# Patient Record
Sex: Male | Born: 1990 | Race: White | Hispanic: No | Marital: Single | Smoking: Former smoker
Health system: Southern US, Community
[De-identification: ages and names within clinical notes are randomized; demographics above are authoritative.]

## PROBLEM LIST (undated history)

## (undated) DIAGNOSIS — F419 Anxiety disorder, unspecified: Secondary | ICD-10-CM

## (undated) DIAGNOSIS — F1120 Opioid dependence, uncomplicated: Secondary | ICD-10-CM

## (undated) DIAGNOSIS — I38 Endocarditis, valve unspecified: Secondary | ICD-10-CM

## (undated) DIAGNOSIS — K759 Inflammatory liver disease, unspecified: Secondary | ICD-10-CM

## (undated) DIAGNOSIS — J15212 Pneumonia due to Methicillin resistant Staphylococcus aureus: Secondary | ICD-10-CM

## (undated) HISTORY — DX: Anxiety disorder, unspecified: F41.9

## (undated) HISTORY — DX: Endocarditis, valve unspecified: I38

## (undated) HISTORY — PX: WRIST FRACTURE SURGERY: SHX121

## (undated) HISTORY — PX: OTHER SURGICAL HISTORY: SHX169

## (undated) HISTORY — DX: Pneumonia due to methicillin resistant Staphylococcus aureus: J15.212

---

## 2011-10-18 ENCOUNTER — Encounter (HOSPITAL_COMMUNITY): Payer: Self-pay

## 2011-10-18 ENCOUNTER — Emergency Department (HOSPITAL_COMMUNITY)
Admission: EM | Admit: 2011-10-18 | Discharge: 2011-10-18 | Disposition: A | Discharge status: Home / Self Care | Payer: Self-pay | Attending: Emergency Medicine | Admitting: Emergency Medicine

## 2011-10-18 DIAGNOSIS — F17200 Nicotine dependence, unspecified, uncomplicated: Secondary | ICD-10-CM | POA: Insufficient documentation

## 2011-10-18 DIAGNOSIS — L089 Local infection of the skin and subcutaneous tissue, unspecified: Secondary | ICD-10-CM | POA: Insufficient documentation

## 2011-10-18 MED ORDER — MINOCYCLINE HCL 100 MG PO CAPS: 100.0000 mg | ORAL_CAPSULE | Freq: Two times a day (BID) | ORAL | Status: AC

## 2011-10-18 NOTE — ED Provider Notes (Signed)
History     CSN: [id number]  Arrival date & time 10/18/11  1300   First MD Initiated Contact with Patient 10/18/11 1440      Chief Complaint  Patient presents with  . Wound Check    HPI Pt was seen at 1500.  Per pt, c/o gradual onset and persistence of constant umbilical area "draining thick yellow pus" for the past 3 days.  States the area "hurts to touch it."  Denies abd pain, no fevers, no rash, no injury, no N/V/D.    History reviewed. No pertinent past medical history.  History reviewed. No pertinent past surgical history.    History  Substance Use Topics  . Smoking status: Current Everyday Smoker    Types: Cigarettes  . Smokeless tobacco: Not on file  . Alcohol Use: No    Review of Systems ROS: Statement: All systems negative except as marked or noted in the HPI; Constitutional: Negative for fever and chills. ; ; Eyes: Negative for eye pain, redness and discharge. ; ; ENMT: Negative for ear pain, hoarseness, nasal congestion, sinus pressure and sore throat. ; ; Cardiovascular: Negative for chest pain, palpitations, diaphoresis, dyspnea and peripheral edema. ; ; Respiratory: Negative for cough, wheezing and stridor. ; ; Gastrointestinal: Negative for nausea, vomiting, diarrhea, abdominal pain, blood in stool, hematemesis, jaundice and rectal bleeding. . ; ; Genitourinary: Negative for dysuria, flank pain and hematuria. ; ; Musculoskeletal: Negative for back pain and neck pain. Negative for swelling and trauma.; ; Skin: +skin lesion. Negative for pruritus, rash, abrasions, blisters, bruising; ; Neuro: Negative for headache, lightheadedness and neck stiffness. Negative for weakness, altered level of consciousness , altered mental status, extremity weakness, paresthesias, involuntary movement, seizure and syncope.      Allergies  Amoxil  Home Medications  No current outpatient prescriptions on file.  BP 120/68  Pulse 74  Temp(Src) 98.7 F (37.1 C) (Oral)  Resp 20  Ht  5\' 10"  (1.778 m)  Wt 184 lb 7 oz (83.66 kg)  BMI 26.46 kg/m2  SpO2 100%  Physical Exam 1510: Physical examination:  Nursing notes reviewed; Vital signs and O2 SAT reviewed;  Constitutional: Well developed, Well nourished, Well hydrated, In no acute distress; Head:  Normocephalic, atraumatic; Eyes: EOMI, PERRL, No scleral icterus; ENMT: Mouth and pharynx normal, Mucous membranes moist; Neck: Supple, Full range of motion, No lymphadenopathy; Cardiovascular: Regular rate and rhythm, No murmur, rub, or gallop; Respiratory: Breath sounds clear & equal bilaterally, No rales, rhonchi, wheezes, or rub, Normal respiratory effort/excursion; Chest: Nontender, Movement normal; Abdomen: Soft, Nontender, Nondistended, Normal bowel sounds;; Extremities: Pulses normal, No tenderness, No edema, No calf edema or asymmetry.; Neuro: AA&Ox3, Major CN grossly intact.  No gross focal motor or sensory deficits in extremities.; Skin: Color normal, Warm, Dry, +small draining pustule at lateral base of umbilicus without surrounding erythema, no abd wall erythema, no soft tissue crepitus.    ED Course  Procedures     MDM  MDM Reviewed: nursing note and vitals     3:31 PM:  Pt with small draining pustule at lateral base of umbilicus without surrounding erythema.  No abd wall erythema or soft tissue crepitus.  Appears localized skin infection at this time, will rx PO abx, have pt perform local wound care, f/u PMD for re-check in 2-3 days/return to ED sooner if worsening.  Pt and family agreeable with plan.         Laray Anger, DO 10/20/11 1318

## 2011-10-18 NOTE — ED Notes (Signed)
Drainage --thick yellow from "belly button" x 3 days, hurts to move

## 2011-10-18 NOTE — ED Notes (Signed)
Pt presents with draining wound to inside of umbilicus. Yellow colored drainage noted. Pt reports tenderness to site. Pt states pain and drainage started approx 3 days ago. Pt a/ox4. resp even and unlabored. NAD at this time. Pt sitting up in stretcher. Stretcher in low locked position. Side rail up for pt safety. Call light within reach. Education on plan of care provided. Pt and mother verbalized understanding. Awaiting MD eval and orders. Will continue to monitor.

## 2011-10-18 NOTE — ED Notes (Signed)
Pt a/ox4. Resp even and unlabored. NAD at this time. D/C instructions reviewed with pt. Pt verbalized understanding. Pt ambulated to lobby with steady gate.  

## 2011-10-18 NOTE — Discharge Instructions (Signed)
RESOURCE GUIDE  Dental Problems  Patients with Medicaid: [facility]                     [facility] [address]                                           [address] Phone:  [phone]                                                  Phone:  [phone]  If unable to pay or uninsured, contact:  [facility] or [facility] to become qualified for the adult dental clinic.  Chronic Pain Problems Contact [facility]  [phone] Patients need to be referred by their primary care doctor.  Insufficient Money for Medicine Contact [employer]:  call "211" or [facility] 271-5999.  No Primary Care Doctor Call [facility]  832-8000 Other agencies that provide inexpensive medical care    [facility]  [phone]    [facility]  [phone]    [facility]  [phone]    [facility] Clinic  [phone]    [facility]  [phone]    [facility]  [phone]  Psychological Services [facility]  [phone] [facility]  [phone] [facility]   [phone] (emergency services 641-4993)  Substance Abuse Resources [facility]  336-882-2125 [facility] 336-784-9470 [facility] 336-285-9073 [facility] 336-845-3988 Residential & Outpatient Substance Abuse Program  800-659-3381  Abuse/Neglect [place] Child Abuse Hotline (336) 641-3795 [place] Child Abuse Hotline 800-378-5315 (After Hours)  Emergency Shelter [facility] (336) 271-5985  Maternity Homes [facility] (336) 275-9566 [facility] (704) 372-4663  MRSA Hotline #:   832-7006    [place] [facility]  [facility] [place] [facility]     [employer]                          [facility] [address] [place]                       [address]      [address]  [place]                                                [place]                            [place] Phone:  [phone]                                   Phone:  [phone]                 Phone:  [phone]  [facility] Phone:  [phone]    [facility] Child Abuse Hotline 650-678-9900 213-285-3181 (After Hours)    Take the prescriptions as directed.  Wash the area with soap and water at least twice a day, and cover with a clean/dry dressing.  Change the dressing whenever it becomes wet or soiled.  Call your regular medical doctor today to schedule a follow up appointment for a recheck within the next 2 to 3 days.  Return to the Emergency Department immediately if worsening.

## 2013-04-13 ENCOUNTER — Telehealth: Payer: Self-pay | Admitting: Nurse Practitioner

## 2013-04-13 NOTE — Telephone Encounter (Signed)
ADVISED PT WOULD NEED TO GO TO ER OR URGENT CARE TO BE SEEN TODAY. VERBALIZED UNDERSTANDING. NO APPT AVAILABLE HERE TODAY.

## 2014-01-07 ENCOUNTER — Encounter (HOSPITAL_COMMUNITY): Payer: Self-pay | Admitting: Emergency Medicine

## 2014-01-07 ENCOUNTER — Emergency Department (HOSPITAL_COMMUNITY)
Admission: EM | Admit: 2014-01-07 | Discharge: 2014-01-07 | Disposition: A | Discharge status: Home / Self Care | Payer: 59 | Attending: Emergency Medicine | Admitting: Emergency Medicine

## 2014-01-07 DIAGNOSIS — J209 Acute bronchitis, unspecified: Secondary | ICD-10-CM | POA: Insufficient documentation

## 2014-01-07 DIAGNOSIS — J40 Bronchitis, not specified as acute or chronic: Secondary | ICD-10-CM

## 2014-01-07 DIAGNOSIS — Z8781 Personal history of (healed) traumatic fracture: Secondary | ICD-10-CM | POA: Insufficient documentation

## 2014-01-07 DIAGNOSIS — Z87891 Personal history of nicotine dependence: Secondary | ICD-10-CM | POA: Insufficient documentation

## 2014-01-07 DIAGNOSIS — Z880 Allergy status to penicillin: Secondary | ICD-10-CM | POA: Insufficient documentation

## 2014-01-07 DIAGNOSIS — J069 Acute upper respiratory infection, unspecified: Secondary | ICD-10-CM

## 2014-01-07 DIAGNOSIS — Z79899 Other long term (current) drug therapy: Secondary | ICD-10-CM | POA: Insufficient documentation

## 2014-01-07 MED ORDER — PROMETHAZINE-CODEINE 6.25-10 MG/5ML PO SYRP: 5.0000 mL | ORAL_SOLUTION | Freq: Four times a day (QID) | ORAL | Status: DC | PRN

## 2014-01-07 MED ORDER — CETIRIZINE-PSEUDOEPHEDRINE ER 5-120 MG PO TB12: 1.0000 | ORAL_TABLET | Freq: Every day | ORAL | Status: DC

## 2014-01-07 MED ORDER — DEXAMETHASONE 4 MG PO TABS: ORAL_TABLET | ORAL | Status: DC

## 2014-01-07 NOTE — ED Notes (Signed)
Chest congestion x 3-4 days w/otalgia starting yesterday.  Denies fevers, n/v.

## 2014-01-07 NOTE — ED Provider Notes (Signed)
Medical screening examination/treatment/procedure(s) were performed by non-physician practitioner and as supervising physician I was immediately available for consultation/collaboration.   EKG Interpretation None        Zebulin Siegel L Jaquetta Currier, MD 01/07/14 2309 

## 2014-01-07 NOTE — ED Notes (Signed)
Lt ear pain, chest congestion, yellow sputum.  , no fever, no n/v  Sick for 3 days.

## 2014-01-07 NOTE — ED Provider Notes (Signed)
CSN: [id number]     Arrival date & time 01/07/14  1813 History   First MD Initiated Contact with Patient 01/07/14 1901     Chief Complaint  Patient presents with  . Otalgia  . congestion      (Consider location/radiation/quality/duration/timing/severity/associated sxs/prior Treatment) HPI Comments: Patient is a 23 year old male who presents to the emergency department with chest congestion, ear pain, and generally not feeling well. The patient states that approximately 2 weeks ago his wife developed pneumonia. Approximately a week after that his younger son developed pneumonia. The patient gives a 3 to four-day history of chest congestion. He has a productive cough that is usually clear. On yesterday, June 3, the patient developed pain in the right greater than left ear, there's been no drainage appreciated. The patient has had a popping sensation. Patient denies fever or chills. There's been no diarrhea, nausea or vomiting.  Patient is a 23 y.o. male presenting with ear pain. The history is provided by the patient.  Otalgia Associated symptoms: congestion and cough   Associated symptoms: no abdominal pain and no neck pain     History reviewed. No pertinent past medical history. Past Surgical History  Procedure Laterality Date  . Tubes in ears    . Wrist fracture surgery     History reviewed. No pertinent family history. History  Substance Use Topics  . Smoking status: Former Smoker    Types: Cigarettes  . Smokeless tobacco: Not on file  . Alcohol Use: No    Review of Systems  Constitutional: Negative for activity change.       All ROS Neg except as noted in HPI  HENT: Positive for congestion and ear pain. Negative for nosebleeds.   Eyes: Negative for photophobia and discharge.  Respiratory: Positive for cough. Negative for shortness of breath and wheezing.   Cardiovascular: Negative for chest pain and palpitations.  Gastrointestinal: Negative for abdominal pain and blood in  stool.  Genitourinary: Negative for dysuria, frequency and hematuria.  Musculoskeletal: Positive for myalgias. Negative for arthralgias, back pain and neck pain.  Skin: Negative.   Neurological: Negative for dizziness, seizures and speech difficulty.  Psychiatric/Behavioral: Negative for hallucinations and confusion.      Allergies  Amoxil and Penicillins  Home Medications   Prior to Admission medications   Medication Sig Start Date End Date Taking? Authorizing Provider  dextromethorphan-guaiFENesin (TUSSIN DM) 10-100 MG/5ML liquid Take 10 mLs by mouth every 4 (four) hours as needed for cough.   Yes Historical Provider, MD  cetirizine-pseudoephedrine (ZYRTEC-D) 5-120 MG per tablet Take 1 tablet by mouth daily. 01/07/14   Kathie Dike, PA-C  dexamethasone (DECADRON) 4 MG tablet 1 po bid with food 01/07/14   Kathie Dike, PA-C  promethazine-codeine (PHENERGAN WITH CODEINE) 6.25-10 MG/5ML syrup Take 5 mLs by mouth every 6 (six) hours as needed for cough. 01/07/14   Kathie Dike, PA-C   BP 117/69  Pulse 90  Temp(Src) 98 F (36.7 C) (Oral)  Resp 20  Ht 5\' 11"  (1.803 m)  Wt 148 lb 3.2 oz (67.223 kg)  BMI 20.68 kg/m2  SpO2 100% Physical Exam  Nursing note and vitals reviewed. Constitutional: He is oriented to person, place, and time. He appears well-developed and well-nourished.  Non-toxic appearance.  HENT:  Head: Normocephalic.  Right Ear: Tympanic membrane and external ear normal.  Left Ear: Tympanic membrane and external ear normal.  Nasal congestion present. There is no pain to percussion over the sinuses.  There is no  bulging or increased redness of the tympanic membranes. There is some fluid behind the right tympanic membrane.  There is some increased redness of the posterior pharynx, no exudate. The uvula is in the midline.  Eyes: EOM and lids are normal. Pupils are equal, round, and reactive to light.  Neck: Normal range of motion. Neck supple. Carotid bruit is not  present.  Cardiovascular: Normal rate, regular rhythm, normal heart sounds, intact distal pulses and normal pulses.   Pulmonary/Chest: No respiratory distress. He has rhonchi.  Abdominal: Soft. Bowel sounds are normal. There is no tenderness. There is no guarding.  Musculoskeletal: Normal range of motion.  Lymphadenopathy:       Head (right side): No submandibular adenopathy present.       Head (left side): No submandibular adenopathy present.    He has no cervical adenopathy.  Neurological: He is alert and oriented to person, place, and time. He has normal strength. No cranial nerve deficit or sensory deficit.  Skin: Skin is warm and dry.  Psychiatric: He has a normal mood and affect. His speech is normal.    ED Course  Procedures (including critical care time) Labs Review Labs Reviewed - No data to display  Imaging Review No results found.   EKG Interpretation None      MDM Vital signs are well within normal limits. Pulse oximetry 100% on room air. Within normal limits by my interpretation. The examination suggest bronchitis and upper respiratory infection.  The plan at this time is for the patient to increase fluids, use Zyrtec at the every 12 hours, Decadron 2 times daily with food, promethazine codeine cough medication every 6 hours as needed for cough. Patient is further advised to wash hands frequently. He will see his primary physician or return to the emergency department if any changes, problems, or concerns.    Final diagnoses:  Bronchitis  URI (upper respiratory infection)    *I have reviewed nursing notes, vital signs, and all appropriate lab and imaging results for this patient.Kathie Dike**    Maleigh Bagot M Rip Hawes, PA-C 01/07/14 1956

## 2014-01-07 NOTE — Discharge Instructions (Signed)
Please use Zyrtec D2 times daily for congestion. Please use Decadron 2 times daily until all taken. Use promethazine codeine cough medication for cough. This medication may cause drowsiness, please use with caution. Please use Tylenol or ibuprofen should you develop fever or body aches. Please see your primary physician for additional evaluation and management if not improving. Bronchitis Bronchitis is inflammation of the airways that extend from the windpipe into the lungs (bronchi). The inflammation often causes mucus to develop, which leads to a cough. If the inflammation becomes severe, it may cause shortness of breath. CAUSES  Bronchitis may be caused by:   Viral infections.   Bacteria.   Cigarette smoke.   Allergens, pollutants, and other irritants.  SIGNS AND SYMPTOMS  The most common symptom of bronchitis is a frequent cough that produces mucus. Other symptoms include:  Fever.   Body aches.   Chest congestion.   Chills.   Shortness of breath.   Sore throat.  DIAGNOSIS  Bronchitis is usually diagnosed through a medical history and physical exam. Tests, such as chest X-rays, are sometimes done to rule out other conditions.  TREATMENT  You may need to avoid contact with whatever caused the problem (smoking, for example). Medicines are sometimes needed. These may include:  Antibiotics. These may be prescribed if the condition is caused by bacteria.  Cough suppressants. These may be prescribed for relief of cough symptoms.   Inhaled medicines. These may be prescribed to help open your airways and make it easier for you to breathe.   Steroid medicines. These may be prescribed for those with recurrent (chronic) bronchitis. HOME CARE INSTRUCTIONS  Get plenty of rest.   Drink enough fluids to keep your urine clear or pale yellow (unless you have a medical condition that requires fluid restriction). Increasing fluids may help thin your secretions and will prevent  dehydration.   Only take over-the-counter or prescription medicines as directed by your health care provider.  Only take antibiotics as directed. Make sure you finish them even if you start to feel better.  Avoid secondhand smoke, irritating chemicals, and strong fumes. These will make bronchitis worse. If you are a smoker, quit smoking. Consider using nicotine gum or skin patches to help control withdrawal symptoms. Quitting smoking will help your lungs heal faster.   Put a cool-mist humidifier in your bedroom at night to moisten the air. This may help loosen mucus. Change the water in the humidifier daily. You can also run the hot water in your shower and sit in the bathroom with the door closed for 5 10 minutes.   Follow up with your health care provider as directed.   Wash your hands frequently to avoid catching bronchitis again or spreading an infection to others.  SEEK MEDICAL CARE IF: Your symptoms do not improve after 1 week of treatment.  SEEK IMMEDIATE MEDICAL CARE IF:  Your fever increases.  You have chills.   You have chest pain.   You have worsening shortness of breath.   You have bloody sputum.  You faint.  You have lightheadedness.  You have a severe headache.   You vomit repeatedly. MAKE SURE YOU:   Understand these instructions.  Will watch your condition.  Will get help right away if you are not doing well or get worse. Document Released: 07/22/2005 Document Revised: 05/12/2013 Document Reviewed: 03/16/2013 [facility] Patient Information 2014 [place], [place].

## 2020-12-05 ENCOUNTER — Other Ambulatory Visit: Payer: Self-pay

## 2020-12-05 ENCOUNTER — Emergency Department (HOSPITAL_COMMUNITY)
Admission: EM | Admit: 2020-12-05 | Discharge: 2020-12-05 | Disposition: A | Discharge status: Home / Self Care | Payer: BLUE CROSS/BLUE SHIELD | Attending: Emergency Medicine | Admitting: Emergency Medicine

## 2020-12-05 ENCOUNTER — Emergency Department (HOSPITAL_COMMUNITY): Payer: BLUE CROSS/BLUE SHIELD

## 2020-12-05 ENCOUNTER — Encounter (HOSPITAL_COMMUNITY): Payer: Self-pay | Admitting: *Deleted

## 2020-12-05 DIAGNOSIS — L03113 Cellulitis of right upper limb: Secondary | ICD-10-CM | POA: Insufficient documentation

## 2020-12-05 DIAGNOSIS — M7989 Other specified soft tissue disorders: Secondary | ICD-10-CM | POA: Diagnosis present

## 2020-12-05 DIAGNOSIS — F17210 Nicotine dependence, cigarettes, uncomplicated: Secondary | ICD-10-CM | POA: Insufficient documentation

## 2020-12-05 LAB — COMPREHENSIVE METABOLIC PANEL
ALT: 37 U/L (ref 0–44)
AST: 40 U/L (ref 15–41)
Albumin: 3.8 g/dL (ref 3.5–5.0)
Alkaline Phosphatase: 68 U/L (ref 38–126)
Anion gap: 8 (ref 5–15)
BUN: 6 mg/dL (ref 6–20)
CO2: 29 mmol/L (ref 22–32)
Calcium: 8.9 mg/dL (ref 8.9–10.3)
Chloride: 97 mmol/L — ABNORMAL LOW (ref 98–111)
Creatinine, Ser: 0.84 mg/dL (ref 0.61–1.24)
GFR, Estimated: >60 mL/min (ref >60)
Glucose, Bld: 96 mg/dL (ref 70–99)
Potassium: 4.2 mmol/L (ref 3.5–5.1)
Sodium: 134 mmol/L — ABNORMAL LOW (ref 135–145)
Total Bilirubin: 1.6 mg/dL — ABNORMAL HIGH (ref 0.3–1.2)
Total Protein: 7.3 g/dL (ref 6.5–8.1)

## 2020-12-05 LAB — CBC WITH DIFFERENTIAL/PLATELET
Abs Immature Granulocytes: 0.02 10*3/uL (ref 0.00–0.07)
Basophils Absolute: 0 10*3/uL (ref 0.0–0.1)
Basophils Relative: 0 %
Eosinophils Absolute: 0.1 10*3/uL (ref 0.0–0.5)
Eosinophils Relative: 1 %
HCT: 38.9 % — ABNORMAL LOW (ref 39.0–52.0)
Hemoglobin: 13.4 g/dL (ref 13.0–17.0)
Immature Granulocytes: 0 %
Lymphocytes Relative: 19 %
Lymphs Abs: 1.6 10*3/uL (ref 0.7–4.0)
MCH: 31.8 pg (ref 26.0–34.0)
MCHC: 34.4 g/dL (ref 30.0–36.0)
MCV: 92.2 fL (ref 80.0–100.0)
Monocytes Absolute: 0.8 10*3/uL (ref 0.1–1.0)
Monocytes Relative: 10 %
Neutro Abs: 6.1 10*3/uL (ref 1.7–7.7)
Neutrophils Relative %: 70 %
Platelets: 222 10*3/uL (ref 150–400)
RBC: 4.22 MIL/uL (ref 4.22–5.81)
RDW: 13.6 % (ref 11.5–15.5)
WBC: 8.6 10*3/uL (ref 4.0–10.5)
nRBC: 0 % (ref 0.0–0.2)

## 2020-12-05 IMAGING — DX DG HAND COMPLETE 3+V*R*
3 series · 3 of 3 positions shown · non-contrast
Comparison: None.

CLINICAL DATA: Right hand swelling for 2 days.  No known injury.

EXAM:
RIGHT HAND - COMPLETE 3+ VIEW

[hand ap]
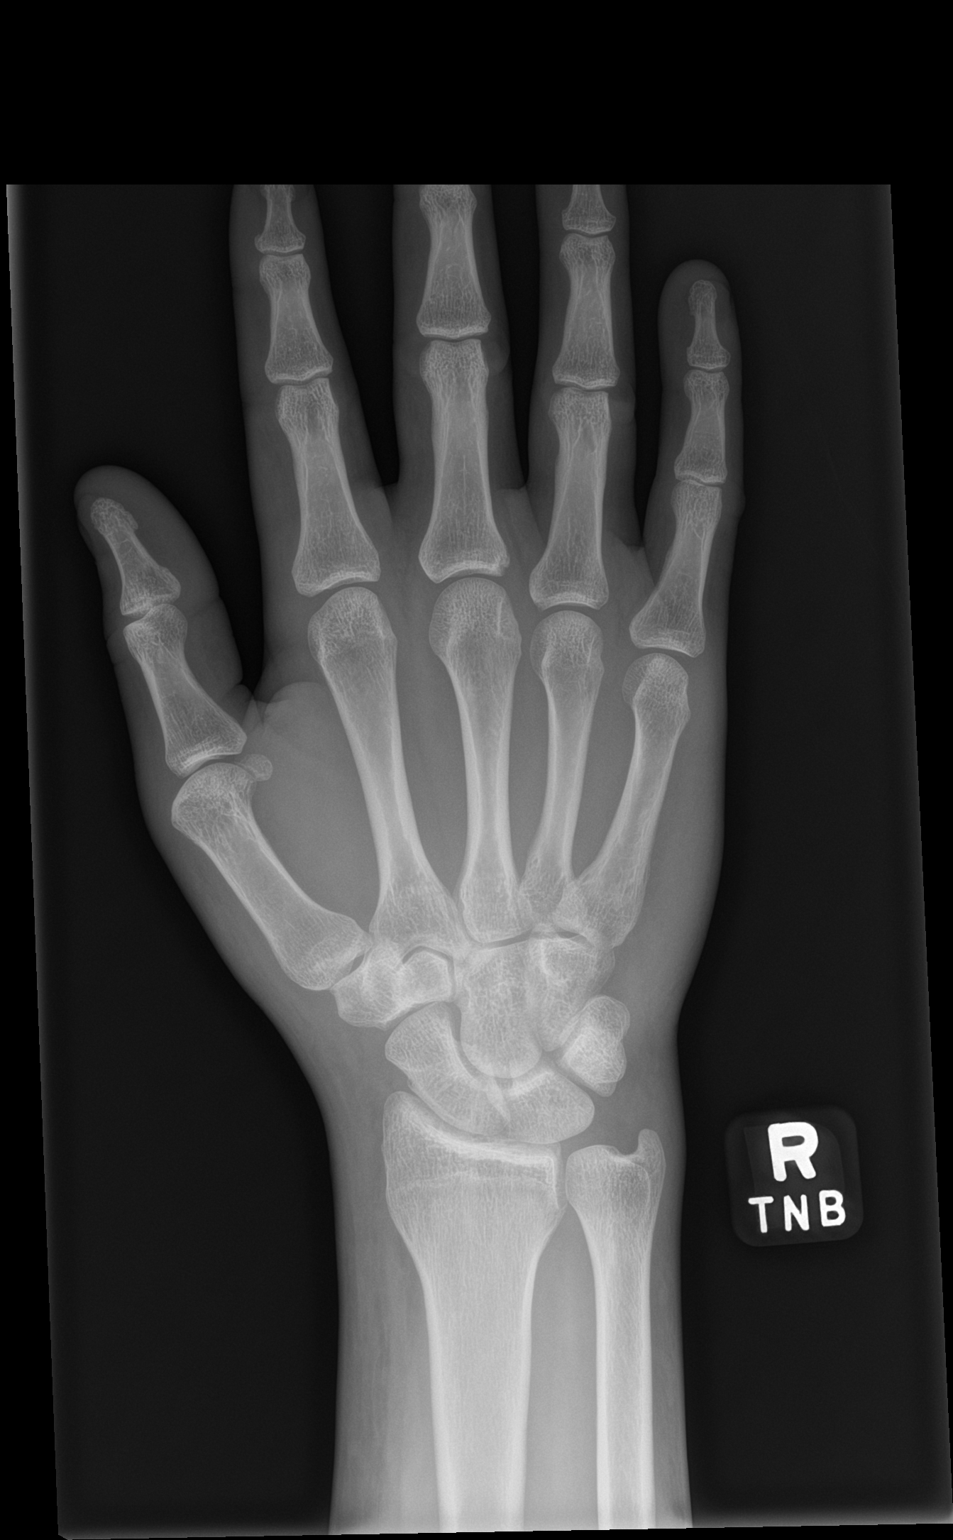

[hand obl]
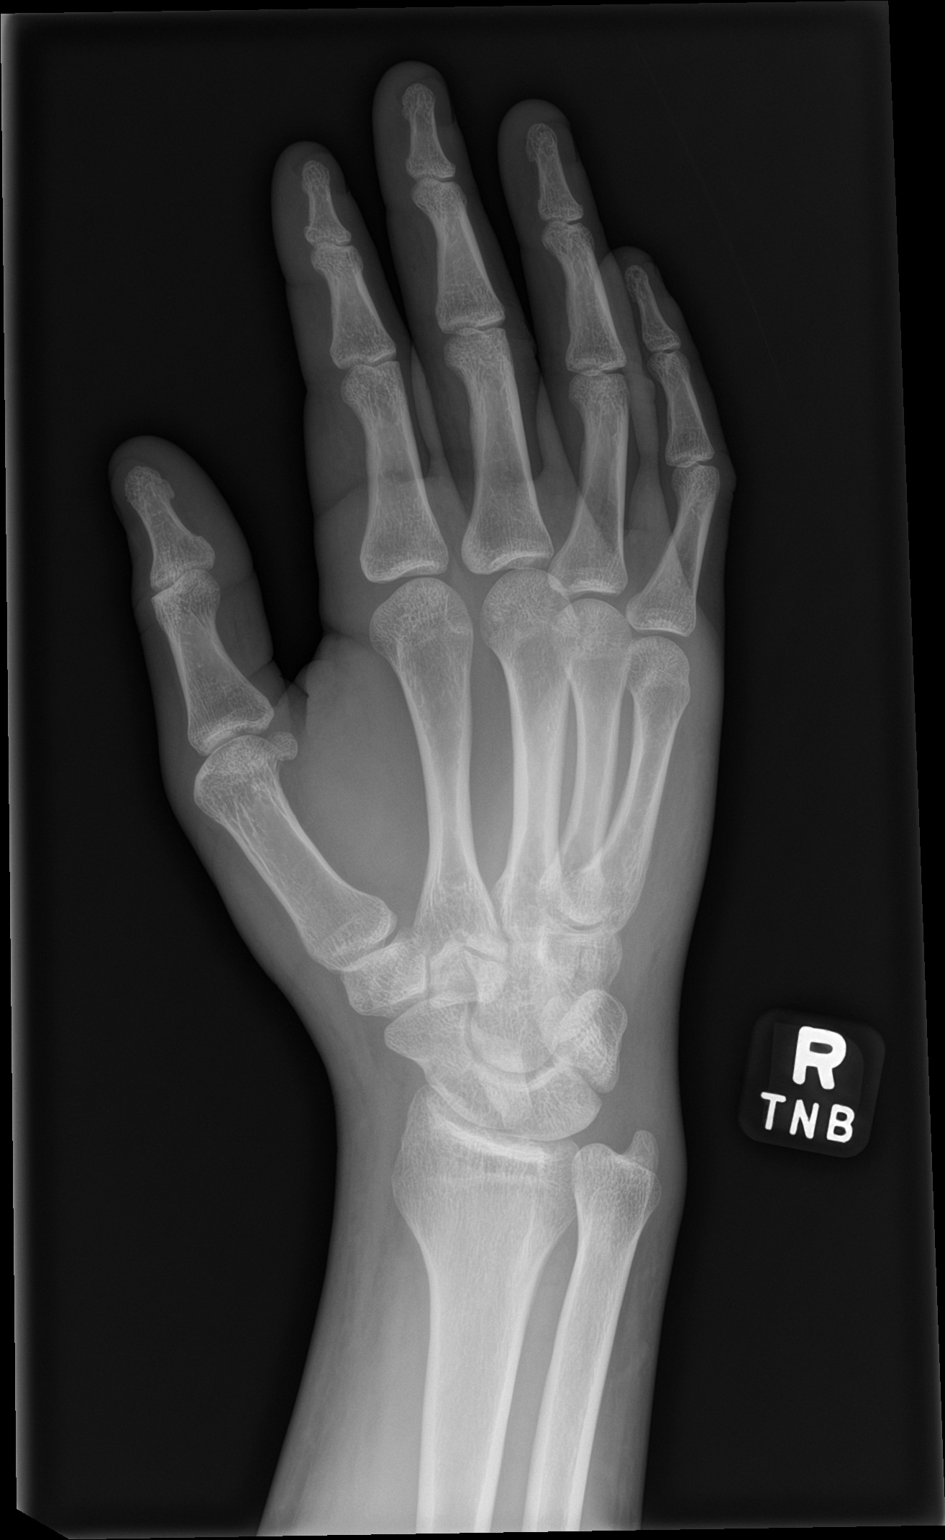

[hand lat]
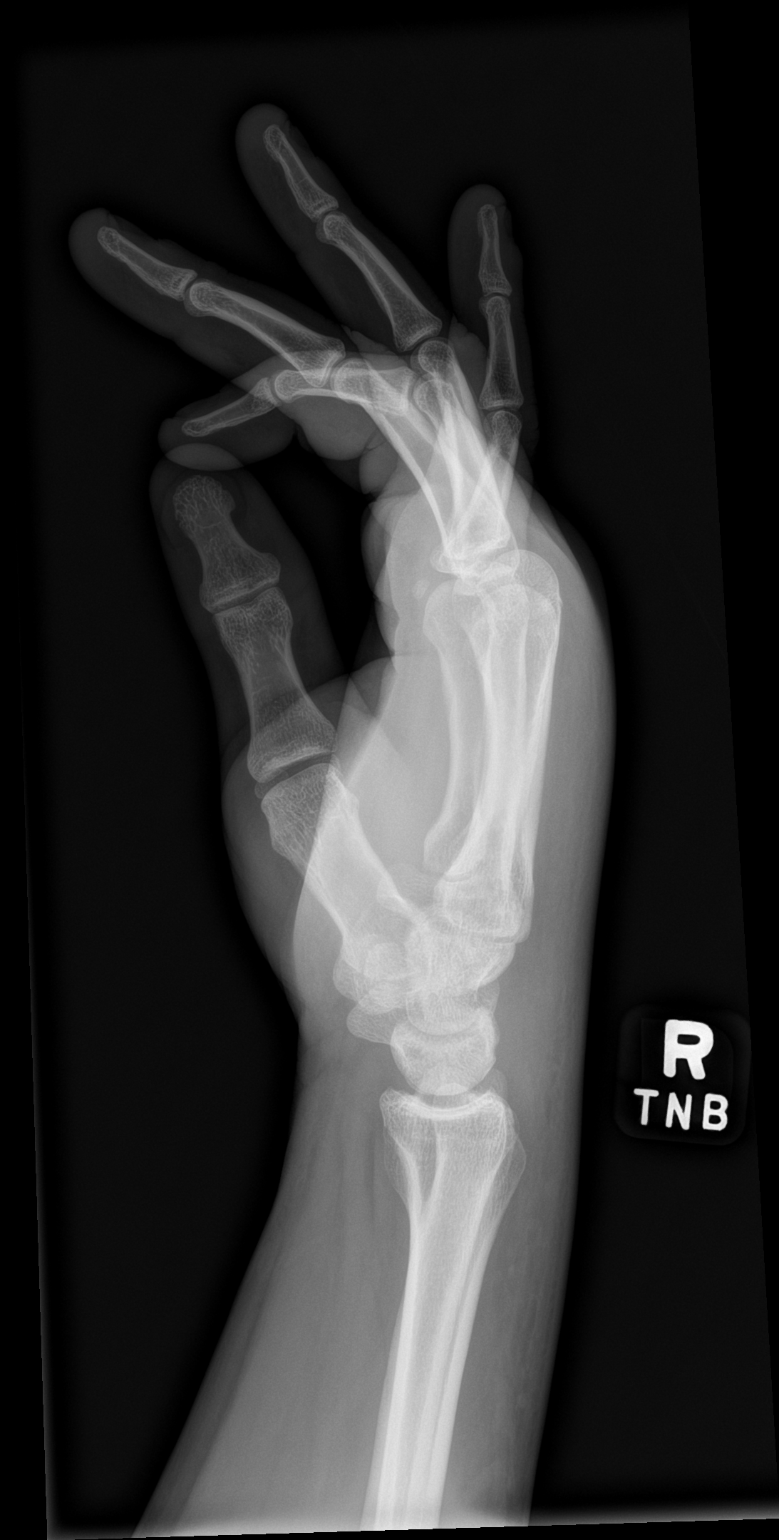

[3 of 3 positions shown; findings below may reference images not displayed]

FINDINGS: Dorsal hand and wrist soft tissue swelling. No radiopaque foreign
body or soft tissue air.

No fracture or bone lesion.  Joints normally spaced and aligned.
IMPRESSION: 1. No fracture, bone lesion or joint abnormality.
2. Nonspecific dorsal soft tissue swelling.

## 2020-12-05 MED ORDER — CLINDAMYCIN HCL 150 MG PO CAPS: 300.0000 mg | ORAL_CAPSULE | Freq: Four times a day (QID) | ORAL | 0 refills | Status: DC

## 2020-12-05 MED ORDER — VANCOMYCIN HCL IN DEXTROSE 1-5 GM/200ML-% IV SOLN
1000.0000 mg | Freq: Once | INTRAVENOUS | Status: AC
  Administered 2020-12-05: 1000 mg via INTRAVENOUS
  Filled 2020-12-05: qty 200

## 2020-12-05 NOTE — ED Triage Notes (Signed)
Right hand swollen and red x 2 days

## 2020-12-05 NOTE — Discharge Instructions (Signed)
Elevate your hand when possible.  Avoid use of your right hand.  Start the antibiotic prescription this evening.  As discussed, return here in 24 to 48 hours for recheck.  Return tomorrow if the redness of your hand is increasing or you develop fever or chills.  If the redness seems to be improving, you may return on Thursday for recheck.

## 2020-12-05 NOTE — ED Provider Notes (Signed)
[facility] EMERGENCY DEPARTMENT Provider Note   CSN: [id number] Arrival date & time: 12/05/20  9833     History No chief complaint on file.   Reginald Clarke is a 30 y.o. male.  HPI      Reginald Loppnow is a 30 y.o. male who presents to the Emergency Department for evaluation of pain redness and swelling of his right hand.  Symptoms began 2 days ago.  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  Developed some swelling and redness along his right wrist that has spread into his hand and distal forearm.  He notes having pain with attempted movement of his fingers and with gripping.  He is right-hand dominant.  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  Unsure of possible foreign body.  He does note some increased redness along the base of his right index finger.  He denies numbness of his fingers, pain of his elbow or shoulder, fever, chills, nausea or vomiting.    History reviewed. No pertinent past medical history.  There are no problems to display for this patient.   Past Surgical History:  Procedure Laterality Date  . tubes in ears    . WRIST FRACTURE SURGERY       No family history on file.  Social History   Tobacco Use  . Smoking status: Current Every Day Smoker    Types: Cigarettes  . Smokeless tobacco: Never Used  Substance Use Topics  . Alcohol use: No  . Drug use: No    Home Medications Prior to Admission medications   Medication Sig Start Date End Date Taking? Authorizing Provider  cetirizine-pseudoephedrine (ZYRTEC-D) 5-120 MG per tablet Take 1 tablet by mouth daily. Patient not taking: No sig reported 01/07/14   Ivery Quale, PA-C  dexamethasone (DECADRON) 4 MG tablet 1 po bid with food Patient not taking: No sig reported 01/07/14   Ivery Quale, PA-C  promethazine-codeine (PHENERGAN WITH CODEINE) 6.25-10 MG/5ML syrup Take 5 mLs by mouth every 6 (six) hours as needed  for cough. Patient not taking: Reported on 12/05/2020 01/07/14   Ivery Quale, PA-C    Allergies    Amoxil [amoxicillin trihydrate] and Penicillins  Review of Systems   Review of Systems  Constitutional: Negative for activity change, appetite change, chills and fever.  HENT: Negative for facial swelling, sore throat and trouble swallowing.   Respiratory: Negative for chest tightness and shortness of breath.   Cardiovascular: Negative for chest pain.  Gastrointestinal: Negative for nausea and vomiting.  Musculoskeletal: Positive for arthralgias (right hand and wrist pain) and myalgias. Negative for neck pain and neck stiffness.  Skin: Positive for color change (redness of dorsal hand and wrist.  ). Negative for rash and wound.  Neurological: Negative for dizziness, syncope, weakness, numbness and headaches.    Physical Exam Updated Vital Signs BP 124/78   Pulse 69   Temp 98.1 F (36.7 C) (Oral)   Resp 18   Ht 5\' 10"  (1.778 m)   Wt 88.6 kg   SpO2 100%   BMI 28.03 kg/m   Physical Exam Vitals and nursing note reviewed.  Constitutional:      General: He is not in acute distress.    Appearance: Normal appearance. He is not ill-appearing.  Eyes:     Conjunctiva/sclera: Conjunctivae normal.  Cardiovascular:     Rate and Rhythm: Normal rate and regular rhythm.  Pulses: Normal pulses.  Pulmonary:     Effort: Pulmonary effort is normal.     Breath sounds: Normal breath sounds.  Musculoskeletal:        General: Swelling and tenderness present.     Comments: Diffuse tenderness to the dorsal right hand and wrist.  Mild to moderate edema noted. Pt unable to fully close right hand due to edema.    Skin:    General: Skin is warm.     Capillary Refill: Capillary refill takes less than 2 seconds.     Findings: Erythema present.     Comments: Erythema of the dorsal hand and wrist.  Excessive erythema noted at the base of the right index finger.  No palpable foreign body or abscess  seen.  No wounds to the webspaces of the fingers.  See attached photo  Neurological:     General: No focal deficit present.     Mental Status: He is alert.         ED Results / Procedures / Treatments   Labs (all labs ordered are listed, but only abnormal results are displayed) Labs Reviewed  COMPREHENSIVE METABOLIC PANEL - Abnormal; Notable for the following components:      Result Value   Sodium 134 (*)    Chloride 97 (*)    Total Bilirubin 1.6 (*)    All other components within normal limits  CBC WITH DIFFERENTIAL/PLATELET - Abnormal; Notable for the following components:   HCT 38.9 (*)    All other components within normal limits    EKG None  Radiology DG Hand Complete Right  Result Date: 12/05/2020 CLINICAL DATA:  Right hand swelling for 2 days.  No known injury. EXAM: RIGHT HAND - COMPLETE 3+ VIEW COMPARISON:  None. FINDINGS: Dorsal hand and wrist soft tissue swelling. No radiopaque foreign body or soft tissue air. No fracture or bone lesion.  Joints normally spaced and aligned. IMPRESSION: 1. No fracture, bone lesion or joint abnormality. 2. Nonspecific dorsal soft tissue swelling. Electronically Signed   By: Amie Portland M.D.   On: 12/05/2020 12:11    Procedures Procedures   Medications Ordered in ED Medications  vancomycin (VANCOCIN) IVPB 1000 mg/200 mL premix (0 mg Intravenous Stopped 12/05/20 1345)    ED Course  I have reviewed the triage vital signs and the nursing notes.  Pertinent labs & imaging results that were available during my care of the patient were reviewed by me and considered in my medical decision making (see chart for details).    MDM Rules/Calculators/A&P                          Pt here with a cellulitis of the hand.  IV drug user, last used 4 days ago.  Injected meth at the right [facility].  Sx's localized to his hand, right AC has track marks, but no erythema, edema or lymphangitis at the reported injection site.    X-ray without presence of  radiopaque foreign body or soft tissue gas.  No bony abnormalities. Labs without evidence of leukocytosis or significant electrolyte derangement.  Patient well-appearing.  Vital signs reviewed.  No concerning symptoms for sepsis and no discrete abscess seen on his hand exam.  Offered admission for his hand cellulitis.  Patient declines admission but is agreeable to return here in 24 to 48 hours for recheck.  Leading edge of erythema was marked by me and photos are attached.  He was given IV vancomycin  here and will be put on oral clindamycin for home.   Final Clinical Impression(s) / ED Diagnoses Final diagnoses:  Cellulitis of right hand    Rx / DC Orders ED Discharge Orders    None       Pauline Aus, PA-C 12/07/20 Denyse Dago, MD 12/11/20 [phone]

## 2020-12-05 NOTE — ED Triage Notes (Addendum)
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

## 2020-12-07 ENCOUNTER — Other Ambulatory Visit: Payer: Self-pay

## 2020-12-07 ENCOUNTER — Emergency Department (HOSPITAL_COMMUNITY)
Admission: EM | Admit: 2020-12-07 | Discharge: 2020-12-07 | Disposition: A | Discharge status: Home / Self Care | Payer: BLUE CROSS/BLUE SHIELD | Attending: Emergency Medicine | Admitting: Emergency Medicine

## 2020-12-07 ENCOUNTER — Encounter (HOSPITAL_COMMUNITY): Payer: Self-pay | Admitting: Emergency Medicine

## 2020-12-07 DIAGNOSIS — L02413 Cutaneous abscess of right upper limb: Secondary | ICD-10-CM | POA: Insufficient documentation

## 2020-12-07 DIAGNOSIS — R21 Rash and other nonspecific skin eruption: Secondary | ICD-10-CM | POA: Diagnosis present

## 2020-12-07 DIAGNOSIS — F17210 Nicotine dependence, cigarettes, uncomplicated: Secondary | ICD-10-CM | POA: Insufficient documentation

## 2020-12-07 DIAGNOSIS — E876 Hypokalemia: Secondary | ICD-10-CM

## 2020-12-07 DIAGNOSIS — L02519 Cutaneous abscess of unspecified hand: Secondary | ICD-10-CM

## 2020-12-07 LAB — COMPREHENSIVE METABOLIC PANEL
ALT: 31 U/L (ref 0–44)
AST: 24 U/L (ref 15–41)
Albumin: 3.2 g/dL — ABNORMAL LOW (ref 3.5–5.0)
Alkaline Phosphatase: 64 U/L (ref 38–126)
Anion gap: 6 (ref 5–15)
BUN: 8 mg/dL (ref 6–20)
CO2: 33 mmol/L — ABNORMAL HIGH (ref 22–32)
Calcium: 8.7 mg/dL — ABNORMAL LOW (ref 8.9–10.3)
Chloride: 98 mmol/L (ref 98–111)
Creatinine, Ser: 0.6 mg/dL — ABNORMAL LOW (ref 0.61–1.24)
GFR, Estimated: >60 mL/min (ref >60)
Glucose, Bld: 105 mg/dL — ABNORMAL HIGH (ref 70–99)
Potassium: 2.5 mmol/L — CL (ref 3.5–5.1)
Sodium: 137 mmol/L (ref 135–145)
Total Bilirubin: 0.7 mg/dL (ref 0.3–1.2)
Total Protein: 6.5 g/dL (ref 6.5–8.1)

## 2020-12-07 LAB — CBC WITH DIFFERENTIAL/PLATELET
Abs Immature Granulocytes: 0.02 10*3/uL (ref 0.00–0.07)
Basophils Absolute: 0 10*3/uL (ref 0.0–0.1)
Basophils Relative: 0 %
Eosinophils Absolute: 0.1 10*3/uL (ref 0.0–0.5)
Eosinophils Relative: 1 %
HCT: 40.2 % (ref 39.0–52.0)
Hemoglobin: 13.9 g/dL (ref 13.0–17.0)
Immature Granulocytes: 0 %
Lymphocytes Relative: 21 %
Lymphs Abs: 1.6 10*3/uL (ref 0.7–4.0)
MCH: 32 pg (ref 26.0–34.0)
MCHC: 34.6 g/dL (ref 30.0–36.0)
MCV: 92.6 fL (ref 80.0–100.0)
Monocytes Absolute: 0.8 10*3/uL (ref 0.1–1.0)
Monocytes Relative: 10 %
Neutro Abs: 5.1 10*3/uL (ref 1.7–7.7)
Neutrophils Relative %: 68 %
Platelets: 261 10*3/uL (ref 150–400)
RBC: 4.34 MIL/uL (ref 4.22–5.81)
RDW: 14 % (ref 11.5–15.5)
WBC: 7.6 10*3/uL (ref 4.0–10.5)
nRBC: 0 % (ref 0.0–0.2)

## 2020-12-07 LAB — AEROBIC CULTURE W GRAM STAIN (SUPERFICIAL SPECIMEN)

## 2020-12-07 MED ORDER — POTASSIUM CHLORIDE CRYS ER 20 MEQ PO TBCR
40.0000 meq | EXTENDED_RELEASE_TABLET | Freq: Once | ORAL | Status: AC
  Administered 2020-12-07: 40 meq via ORAL
  Filled 2020-12-07: qty 2

## 2020-12-07 MED ORDER — LIDOCAINE HCL (PF) 2 % IJ SOLN: 10.0000 mL | Freq: Once | INTRAMUSCULAR | Status: AC

## 2020-12-07 MED ORDER — VANCOMYCIN HCL IN DEXTROSE 1-5 GM/200ML-% IV SOLN
1000.0000 mg | Freq: Once | INTRAVENOUS | Status: AC
  Administered 2020-12-07: 1000 mg via INTRAVENOUS
  Filled 2020-12-07: qty 200

## 2020-12-07 MED ORDER — LIDOCAINE HCL (PF) 2 % IJ SOLN
INTRAMUSCULAR | Status: AC
  Administered 2020-12-07: 10 mL
  Filled 2020-12-07: qty 10

## 2020-12-07 MED ORDER — POTASSIUM CHLORIDE 10 MEQ/100ML IV SOLN
10.0000 meq | Freq: Once | INTRAVENOUS | Status: AC
  Administered 2020-12-07: 10 meq via INTRAVENOUS
  Filled 2020-12-07: qty 100

## 2020-12-07 NOTE — ED Triage Notes (Signed)
Pt reports was seen for same several days ago. Pt reports pain swelling continues. Pt noted to have redness extending past purple margins that were drawn last visit.

## 2020-12-07 NOTE — ED Provider Notes (Signed)
[facility] EMERGENCY DEPARTMENT Provider Note   CSN: [id number] Arrival date & time: 12/07/20  1000     History Chief Complaint  Patient presents with  . Hand Pain    Reginald Clarke is a 30 y.o. male.  Patient return for cellulitis to his right hand.  He has been taking clindamycin for infection.  He states it seemed to be getting worse on the ventral part of his hand  The history is provided by the patient and medical records. No language interpreter was used.  Rash Location: Right arm. Quality: not blistering   Severity:  Moderate Onset quality:  Sudden Timing:  Constant Progression:  Worsening Chronicity:  New Context: not animal contact   Relieved by:  Nothing Associated symptoms: no abdominal pain, no diarrhea, no fatigue and no headaches        History reviewed. No pertinent past medical history.  There are no problems to display for this patient.   Past Surgical History:  Procedure Laterality Date  . tubes in ears    . WRIST FRACTURE SURGERY         History reviewed. No pertinent family history.  Social History   Tobacco Use  . Smoking status: Current Every Day Smoker    Types: Cigarettes  . Smokeless tobacco: Never Used  Substance Use Topics  . Alcohol use: No  . Drug use: No    Home Medications Prior to Admission medications   Medication Sig Start Date End Date Taking? Authorizing Provider  cetirizine-pseudoephedrine (ZYRTEC-D) 5-120 MG per tablet Take 1 tablet by mouth daily. Patient not taking: No sig reported 01/07/14   Ivery Quale, PA-C  clindamycin (CLEOCIN) 150 MG capsule Take 2 capsules (300 mg total) by mouth 4 (four) times daily. 12/05/20   Triplett, Tammy, PA-C  dexamethasone (DECADRON) 4 MG tablet 1 po bid with food Patient not taking: No sig reported 01/07/14   Ivery Quale, PA-C  promethazine-codeine (PHENERGAN WITH CODEINE) 6.25-10 MG/5ML syrup Take 5 mLs by mouth every 6 (six) hours as needed for cough. Patient not taking:  Reported on 12/05/2020 01/07/14   Ivery Quale, PA-C    Allergies    Amoxil [amoxicillin trihydrate] and Penicillins  Review of Systems   Review of Systems  Constitutional: Negative for appetite change and fatigue.  HENT: Negative for congestion, ear discharge and sinus pressure.   Eyes: Negative for discharge.  Respiratory: Negative for cough.   Cardiovascular: Negative for chest pain.  Gastrointestinal: Negative for abdominal pain and diarrhea.  Genitourinary: Negative for frequency and hematuria.  Musculoskeletal: Negative for back pain.  Skin: Positive for rash.       Tender right hand  Neurological: Negative for seizures and headaches.  Psychiatric/Behavioral: Negative for hallucinations.    Physical Exam Updated Vital Signs BP 126/76 (BP Location: Right Arm)   Pulse 66   Temp 98.5 F (36.9 C) (Oral)   Resp 18   Ht 5\' 10"  (1.778 m)   Wt 88.5 kg   SpO2 95%   BMI 27.98 kg/m   Physical Exam Vitals and nursing note reviewed.  Constitutional:      Appearance: He is well-developed.  HENT:     Head: Normocephalic.     Nose: Nose normal.  Eyes:     General: No scleral icterus.    Conjunctiva/sclera: Conjunctivae normal.  Neck:     Thyroid: No thyromegaly.  Cardiovascular:     Rate and Rhythm: Normal rate and regular rhythm.     Heart sounds: No  murmur heard. No friction rub. No gallop.   Pulmonary:     Breath sounds: No stridor. No wheezing or rales.  Chest:     Chest wall: No tenderness.  Abdominal:     General: There is no distension.     Tenderness: There is no abdominal tenderness. There is no rebound.  Musculoskeletal:        General: Normal range of motion.     Cervical back: Neck supple.     Comments: Patient has cellulitis of her right hand and distal forearm.  He seems to have an abscess at the ventral surface of his right hand near the first and second MCP joints  Lymphadenopathy:     Cervical: No cervical adenopathy.  Skin:    Findings: Erythema  and rash present.  Neurological:     Mental Status: He is alert and oriented to person, place, and time.     Motor: No abnormal muscle tone.     Coordination: Coordination normal.  Psychiatric:        Behavior: Behavior normal.         ED Results / Procedures / Treatments   Labs (all labs ordered are listed, but only abnormal results are displayed) Labs Reviewed  CBC WITH DIFFERENTIAL/PLATELET  COMPREHENSIVE METABOLIC PANEL    EKG None  Radiology DG Hand Complete Right  Result Date: 12/05/2020 CLINICAL DATA:  Right hand swelling for 2 days.  No known injury. EXAM: RIGHT HAND - COMPLETE 3+ VIEW COMPARISON:  None. FINDINGS: Dorsal hand and wrist soft tissue swelling. No radiopaque foreign body or soft tissue air. No fracture or bone lesion.  Joints normally spaced and aligned. IMPRESSION: 1. No fracture, bone lesion or joint abnormality. 2. Nonspecific dorsal soft tissue swelling. Electronically Signed   By: Amie Portland M.D.   On: 12/05/2020 12:11    Procedures .Marland KitchenIncision and Drainage  Date/Time: 12/07/2020 4:55 PM Performed by: Bethann Berkshire, MD Authorized by: Bethann Berkshire, MD   Comments:     Patient had an abscess to the first and second MCP joint.  Area was cleaned thoroughly with Betadine.  It was anesthetized with 2% lidocaine.  #11 blade was used to open the abscess and moderate amount of pus was removed.  Cultures were done.  Area with irrigated thoroughly.  Abscess was packed.  There was no Hibiclens available to soak in the wound.  Patient tolerated the procedure well     Medications Ordered in ED Medications - No data to display  ED Course  I have reviewed the triage vital signs and the nursing notes.  Pertinent labs & imaging results that were available during my care of the patient were reviewed by me and considered in my medical decision making (see chart for details).    MDM Rules/Calculators/A&P                          Abscess to right hand  drained.  Patient will follow up with orthopedics tomorrow.  Patient also had some hypokalemia that has been treated and he will follow-up for that Final Clinical Impression(s) / ED Diagnoses Final diagnoses:  None    Rx / DC Orders ED Discharge Orders    None       Bethann Berkshire, MD 12/07/20 1656

## 2020-12-07 NOTE — Discharge Instructions (Addendum)
Follow-up at [facility] office at 9 AM tomorrow.  Continue taking your clindamycin.  Follow-up with a family doctor or urgent care next week to get your potassium rechecked

## 2020-12-07 NOTE — ED Notes (Signed)
Date and time results received: 12/07/20 1220   Test: 2.5 Critical Value: Potassium Name of Provider Notified: Dr. Estell Harpin  Orders Received? Or Actions Taken?: No new orders given.

## 2020-12-08 ENCOUNTER — Ambulatory Visit (INDEPENDENT_AMBULATORY_CARE_PROVIDER_SITE_OTHER): Payer: BLUE CROSS/BLUE SHIELD | Admitting: Orthopedic Surgery

## 2020-12-08 ENCOUNTER — Encounter: Payer: Self-pay | Admitting: Orthopedic Surgery

## 2020-12-08 VITALS — BP 129/78 | HR 60 | Ht 70.0 in | Wt 199.0 lb

## 2020-12-08 DIAGNOSIS — L02511 Cutaneous abscess of right hand: Secondary | ICD-10-CM | POA: Diagnosis not present

## 2020-12-08 DIAGNOSIS — L02519 Cutaneous abscess of unspecified hand: Secondary | ICD-10-CM

## 2020-12-08 MED ORDER — HIBICLENS 4 % EX LIQD: Freq: Every day | CUTANEOUS | 0 refills | Status: DC | PRN

## 2020-12-08 NOTE — Progress Notes (Signed)
New Patient Visit  Assessment: [place] Pressman is a 30 y.o. RHD male with the following: Collar-button abscess of the right second webspace  Plan: Patient states he is doing much better following a bedside I&D in the emergency department yesterday.  The pain is improved.  The redness in his hand is improved.  He is tolerating Keflex well, and we do not have any additional culture information to adjust this medication.  We soaked his hand in Hibiclens for approximately 15 minutes in clinic today.  We advised him to continue with soaks, 3-4 times daily.  A prescription was provided for Hibiclens.  He should keep a dry dressing on his hand.  We will see him back in 4-5 days.  If he has any issues in the interim, he should contact the clinic for an earlier appointment.   Follow-up: Return in about 4 days (around 12/12/2020).  Subjective:  Chief Complaint  Patient presents with  . Wound Check    Rt hand swelling and pain for 1 wk. Had I&D in ER 12/07/20.    History of Present Illness: [place] Leclaire is a 30 y.o. RHD male who presents for evaluation of his right hand.  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  Approximately 1 week ago, he noted progressive swelling, pain and redness developing in his right hand.  He is not certain of a specific injury.  However, he does state that he has splinters in his hands on a regular basis.  He has not had any fevers recently.  He went to the emergency department yesterday where they did a bedside I&D, and decompressed the abscess.  Cultures were sent, but these are not finalized.  His pain is improved since the I&D.  He has been elevating his hand is much as possible.  He has been taking Keflex and this has been tolerated well.  Mild numbness and tingling in the index and long fingers.    Review of Systems: No fevers or chills No numbness or tingling No chest pain No shortness of breath No bowel or bladder  dysfunction No GI distress No headaches   Medical History:  No past medical history on file.  Past Surgical History:  Procedure Laterality Date  . tubes in ears    . WRIST FRACTURE SURGERY      No family history on file. Social History   Tobacco Use  . Smoking status: Current Every Day Smoker    Types: Cigarettes  . Smokeless tobacco: Never Used  Substance Use Topics  . Alcohol use: No  . Drug use: No    Allergies  Allergen Reactions  . Amoxil [Amoxicillin Trihydrate]   . Penicillins     Current Meds  Medication Sig  . chlorhexidine (HIBICLENS) 4 % external liquid Apply topically daily as needed. Mix a capful of hibiclens with water and submerge hand for 15-20, 3 times daily  . clindamycin (CLEOCIN) 150 MG capsule Take 2 capsules (300 mg total) by mouth 4 (four) times daily.    Objective: BP 129/78   Pulse 60   Ht 5\' 10"  (1.778 m)   Wt 199 lb (90.3 kg)   BMI 28.55 kg/m   Physical Exam:  General: Alert and oriented.  No acute distress. Gait: Normal  Evaluation of the right hand demonstrates a 1.5 cm incision in the second webspace, on the palmar surface of the hand.  This extends through the cutaneous tissue.  There is  some surrounding redness, with a very mild amount of redness extending proximally.  On the dorsum of the hand, there is some mild redness that extends across the wrist.  He notes some pain with flexion of the fingers, however he can almost make a full fist.  The packing was removed, and there was some purulent material on the packing.  Otherwise, there is no active drainage.  Fingers are warm and well perfused.  2+ DP pulse.          IMAGING: I personally reviewed images previously obtained from the ED   X-ray of the right hand was obtained in the emergency department and demonstrates no acute injuries.  Some mild swelling is noted on the dorsum of the hand   New Medications:  Meds ordered this encounter  Medications  .  chlorhexidine (HIBICLENS) 4 % external liquid    Sig: Apply topically daily as needed. Mix a capful of hibiclens with water and submerge hand for 15-20, 3 times daily    Dispense:  120 mL    Refill:  0      Oliver Barre, MD  12/08/2020 2:56 PM

## 2020-12-08 NOTE — Patient Instructions (Signed)
Letter for work - out until scheduled follow up  Return Tues or Wed next week

## 2020-12-10 LAB — AEROBIC CULTURE W GRAM STAIN (SUPERFICIAL SPECIMEN)

## 2020-12-11 ENCOUNTER — Telehealth: Payer: Self-pay | Admitting: Emergency Medicine

## 2020-12-11 NOTE — Telephone Encounter (Signed)
Post ED Visit - Positive Culture Follow-up  Culture report reviewed by antimicrobial stewardship pharmacist: [facility] []  , Pharm.D. []  Enzo Bi, Pharm.D., BCPS AQ-ID []  , Pharm.D., BCPS []  Celedonio Miyamoto, .D., BCPS []  [place], .D., BCPS, AAHIVP []  Georgina Pillion, Pharm.D., BCPS, AAHIVP []  [address], PharmD, BCPS []  , PharmD, BCPS []  [place], PharmD, BCPS []  [address], PharmD []  , PharmD, BCPS []  Estella Husk, PharmD PharmD  [facility] []  , PharmD []  Phillips Climes, PharmD []  , PharmD []  Agapito Games, Rph []  ) Verlan Friends, PharmD []  , PharmD []  Mervyn Gay, PharmD []  , PharmD []  Vinnie Level, PharmD []  Gerrit Halls, PharmD []  [facility], PharmD []  , PharmD []  Len Childs, PharmD   Positive wound culture Treated with clindamycin, organism sensitive to the same and no further patient follow-up is required at this time.  12/11/2020, 3:34 PM

## 2020-12-12 ENCOUNTER — Encounter: Payer: Self-pay | Admitting: Orthopedic Surgery

## 2020-12-12 ENCOUNTER — Ambulatory Visit: Payer: BLUE CROSS/BLUE SHIELD | Admitting: Orthopedic Surgery

## 2021-02-24 DIAGNOSIS — D72829 Elevated white blood cell count, unspecified: Secondary | ICD-10-CM | POA: Insufficient documentation

## 2021-02-24 DIAGNOSIS — E872 Acidosis, unspecified: Secondary | ICD-10-CM | POA: Insufficient documentation

## 2021-02-24 DIAGNOSIS — N179 Acute kidney failure, unspecified: Secondary | ICD-10-CM | POA: Insufficient documentation

## 2021-02-24 DIAGNOSIS — T6701XA Heatstroke and sunstroke, initial encounter: Secondary | ICD-10-CM | POA: Insufficient documentation

## 2021-11-10 ENCOUNTER — Other Ambulatory Visit: Payer: Self-pay

## 2021-11-10 ENCOUNTER — Emergency Department (HOSPITAL_COMMUNITY)
Admission: EM | Admit: 2021-11-10 | Discharge: 2021-11-11 | Disposition: A | Discharge status: Home / Self Care | Payer: BLUE CROSS/BLUE SHIELD | Attending: Emergency Medicine | Admitting: Emergency Medicine

## 2021-11-10 ENCOUNTER — Encounter (HOSPITAL_COMMUNITY): Payer: Self-pay

## 2021-11-10 DIAGNOSIS — R5381 Other malaise: Secondary | ICD-10-CM | POA: Diagnosis not present

## 2021-11-10 DIAGNOSIS — R000 Tachycardia, unspecified: Secondary | ICD-10-CM | POA: Insufficient documentation

## 2021-11-10 DIAGNOSIS — J189 Pneumonia, unspecified organism: Secondary | ICD-10-CM | POA: Insufficient documentation

## 2021-11-10 DIAGNOSIS — E876 Hypokalemia: Secondary | ICD-10-CM | POA: Insufficient documentation

## 2021-11-10 DIAGNOSIS — R161 Splenomegaly, not elsewhere classified: Secondary | ICD-10-CM | POA: Diagnosis not present

## 2021-11-10 DIAGNOSIS — R059 Cough, unspecified: Secondary | ICD-10-CM | POA: Diagnosis present

## 2021-11-10 DIAGNOSIS — M791 Myalgia, unspecified site: Secondary | ICD-10-CM | POA: Diagnosis not present

## 2021-11-10 DIAGNOSIS — Z20822 Contact with and (suspected) exposure to covid-19: Secondary | ICD-10-CM | POA: Insufficient documentation

## 2021-11-10 LAB — URINALYSIS, ROUTINE W REFLEX MICROSCOPIC
Bacteria, UA: NONE SEEN
Bilirubin Urine: NEGATIVE
Glucose, UA: NEGATIVE mg/dL
Ketones, ur: NEGATIVE mg/dL
Nitrite: NEGATIVE
Protein, ur: 30 mg/dL — AB
Specific Gravity, Urine: 1.016 (ref 1.005–1.030)
WBC, UA: >50 WBC/hpf — ABNORMAL HIGH (ref 0–5)
pH: 6 (ref 5.0–8.0)

## 2021-11-10 LAB — RESP PANEL BY RT-PCR (FLU A&B, COVID) ARPGX2
Influenza A by PCR: NEGATIVE
Influenza B by PCR: NEGATIVE
SARS Coronavirus 2 by RT PCR: NEGATIVE

## 2021-11-10 NOTE — ED Triage Notes (Addendum)
Pt c/o N/V/D for the last 5 days. States he's had headaches and body aches as well. Also c/o left flank pain, states he has been having a hard time voiding.  ?

## 2021-11-11 ENCOUNTER — Emergency Department (HOSPITAL_COMMUNITY): Payer: BLUE CROSS/BLUE SHIELD

## 2021-11-11 DIAGNOSIS — R7881 Bacteremia: Secondary | ICD-10-CM | POA: Diagnosis not present

## 2021-11-11 LAB — BASIC METABOLIC PANEL
Anion gap: 11 (ref 5–15)
BUN: 12 mg/dL (ref 6–20)
CO2: 30 mmol/L (ref 22–32)
Calcium: 8.4 mg/dL — ABNORMAL LOW (ref 8.9–10.3)
Chloride: 89 mmol/L — ABNORMAL LOW (ref 98–111)
Creatinine, Ser: 0.81 mg/dL (ref 0.61–1.24)
GFR, Estimated: >60 mL/min (ref >60)
Glucose, Bld: 130 mg/dL — ABNORMAL HIGH (ref 70–99)
Potassium: 2.6 mmol/L — CL (ref 3.5–5.1)
Sodium: 130 mmol/L — ABNORMAL LOW (ref 135–145)

## 2021-11-11 LAB — CBC
HCT: 36.9 % — ABNORMAL LOW (ref 39.0–52.0)
Hemoglobin: 12.3 g/dL — ABNORMAL LOW (ref 13.0–17.0)
MCH: 29.6 pg (ref 26.0–34.0)
MCHC: 33.3 g/dL (ref 30.0–36.0)
MCV: 88.9 fL (ref 80.0–100.0)
Platelets: 66 10*3/uL — ABNORMAL LOW (ref 150–400)
RBC: 4.15 MIL/uL — ABNORMAL LOW (ref 4.22–5.81)
RDW: 13.4 % (ref 11.5–15.5)
WBC: 13.3 10*3/uL — ABNORMAL HIGH (ref 4.0–10.5)
nRBC: 0 % (ref 0.0–0.2)

## 2021-11-11 LAB — BLOOD CULTURE ID PANEL (REFLEXED) - BCID2

## 2021-11-11 LAB — HEPATIC FUNCTION PANEL
ALT: 18 U/L (ref 0–44)
AST: 30 U/L (ref 15–41)
Albumin: 2.5 g/dL — ABNORMAL LOW (ref 3.5–5.0)
Alkaline Phosphatase: 78 U/L (ref 38–126)
Bilirubin, Direct: 0.3 mg/dL — ABNORMAL HIGH (ref 0.0–0.2)
Indirect Bilirubin: 0.3 mg/dL (ref 0.3–0.9)
Total Bilirubin: 0.6 mg/dL (ref 0.3–1.2)
Total Protein: 6.8 g/dL (ref 6.5–8.1)

## 2021-11-11 LAB — TROPONIN I (HIGH SENSITIVITY)
Troponin I (High Sensitivity): 4 ng/L (ref <18)
Troponin I (High Sensitivity): 5 ng/L (ref <18)

## 2021-11-11 LAB — MAGNESIUM: Magnesium: 2 mg/dL (ref 1.7–2.4)

## 2021-11-11 LAB — LIPASE, BLOOD: Lipase: 26 U/L (ref 11–51)

## 2021-11-11 IMAGING — CT CT ABD-PELV W/ CM
2 of 4 series · 15 of 46 positions shown, 17 images · IV contrast (Omnipaque or Isovue)
Comparison: None.

CLINICAL DATA: Left lower quadrant pain

EXAM:
CT ABDOMEN AND PELVIS WITH CONTRAST
TECHNIQUE: Multidetector CT imaging of the abdomen and pelvis was performed
using the standard protocol following bolus administration of
intravenous contrast.

[Series 2: axial st · axial · 0.82mm/px · z∈[+672,+1172]mm · 12 of 110 slices shown, 14 images]
[im 5/110  soft-tissue]
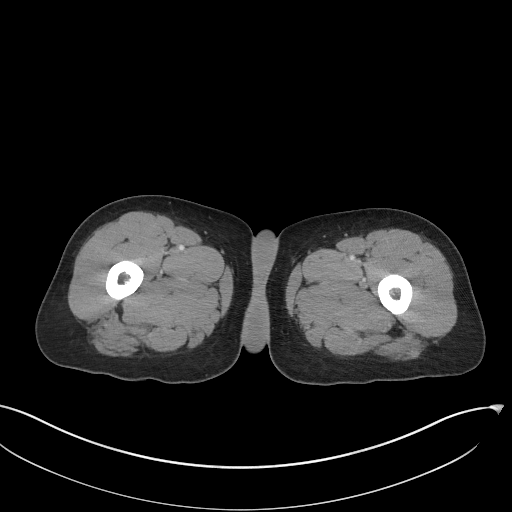
[im 5/110  bone]
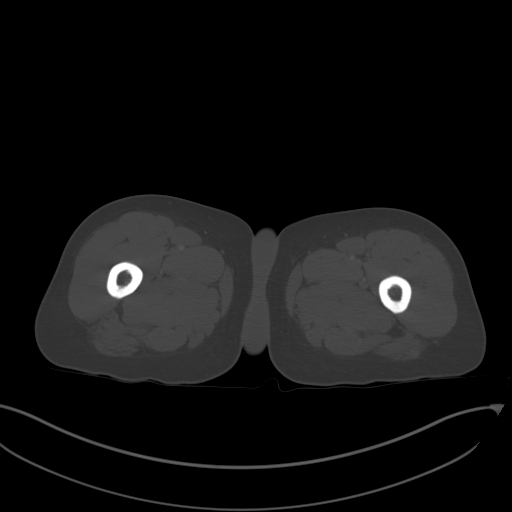
[im 15/110  soft-tissue]
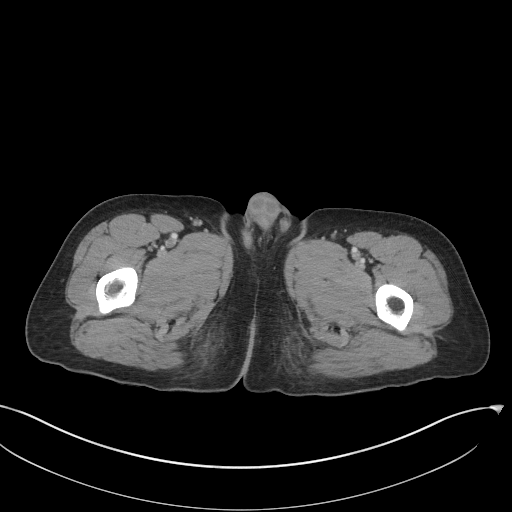
[im 24/110  soft-tissue]
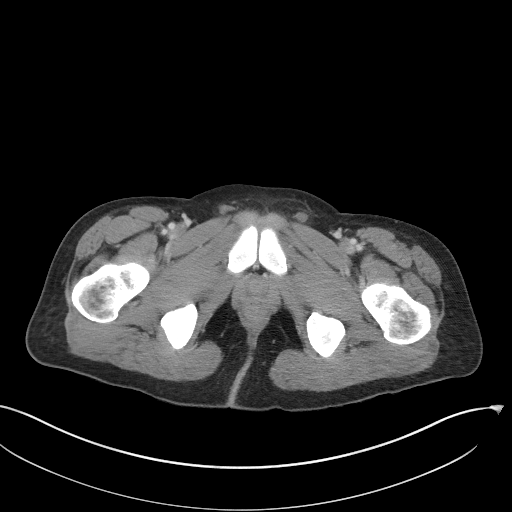
[im 34/110  soft-tissue]
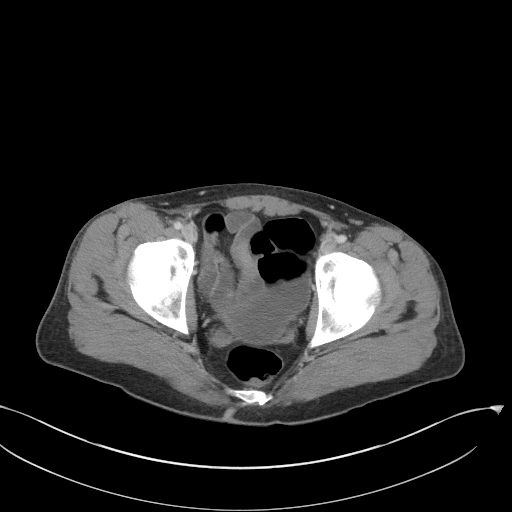
[im 43/110  soft-tissue]
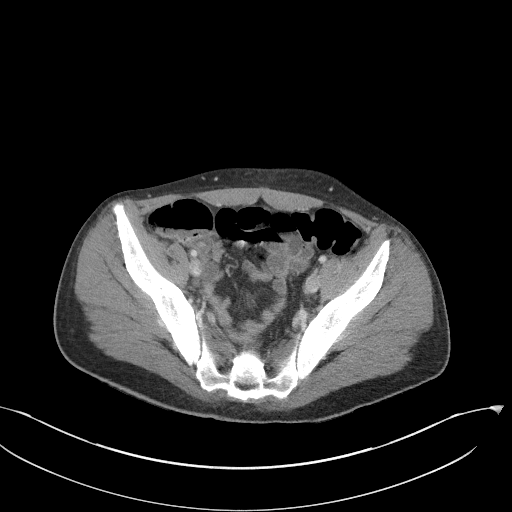
[im 53/110  soft-tissue]
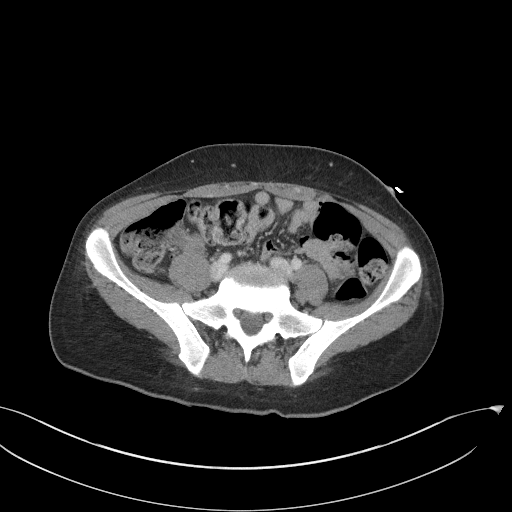
[im 57/110  soft-tissue]
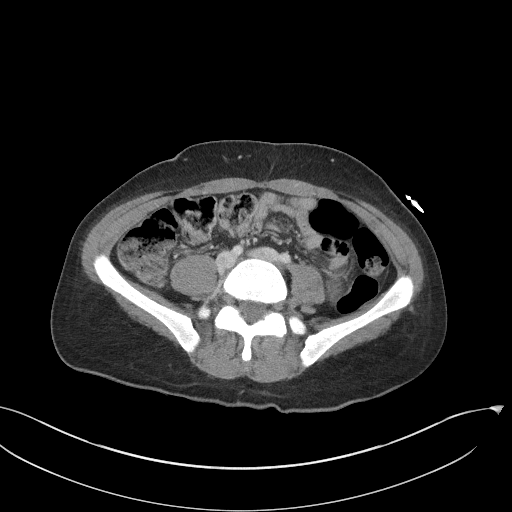
[im 67/110  soft-tissue]
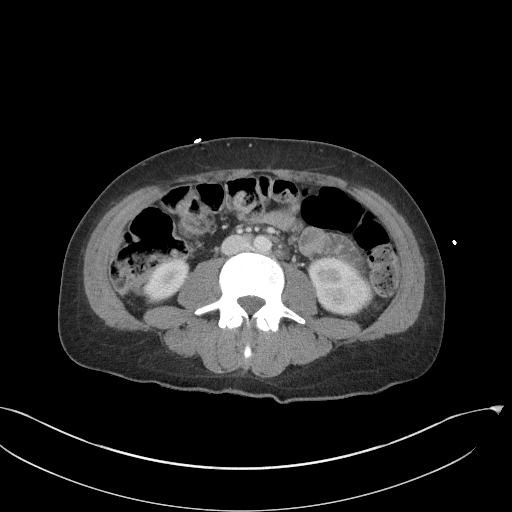
[im 76/110  soft-tissue]
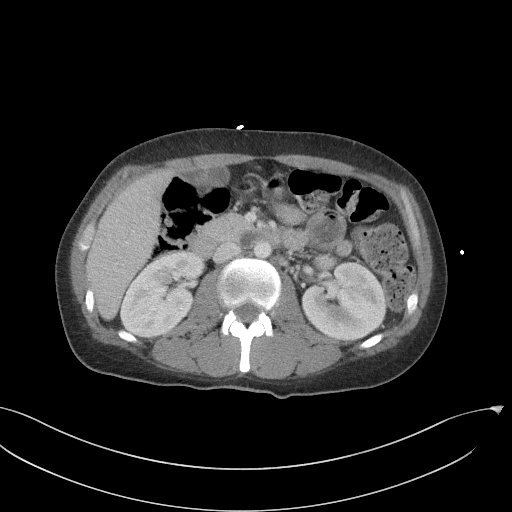
[im 76/110  bone]
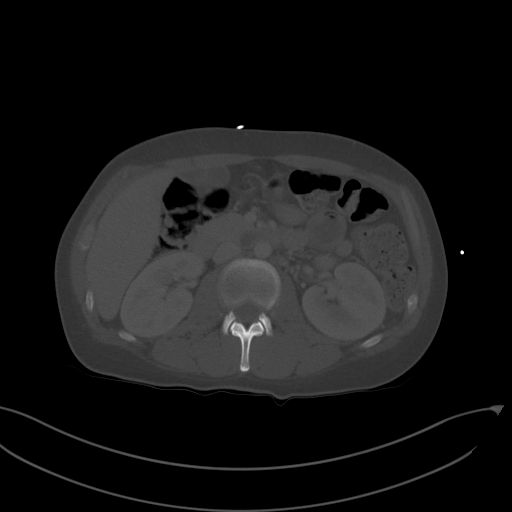
[im 86/110  soft-tissue]
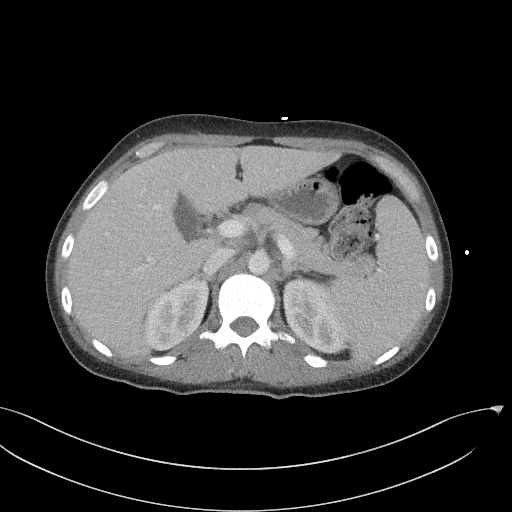
[im 95/110  soft-tissue]
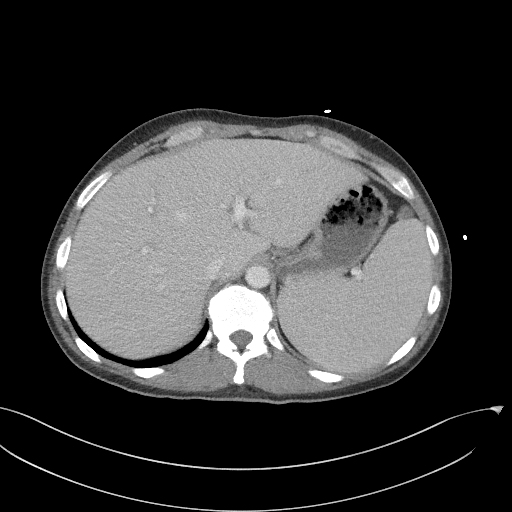
[im 105/110  soft-tissue]
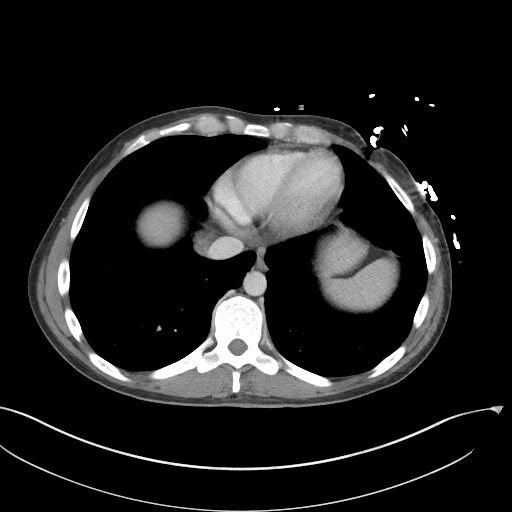

[Series 5: coronal st · coronal · 0.75mm/px · 3 of 93 slices shown]
[im 31/93  soft-tissue]
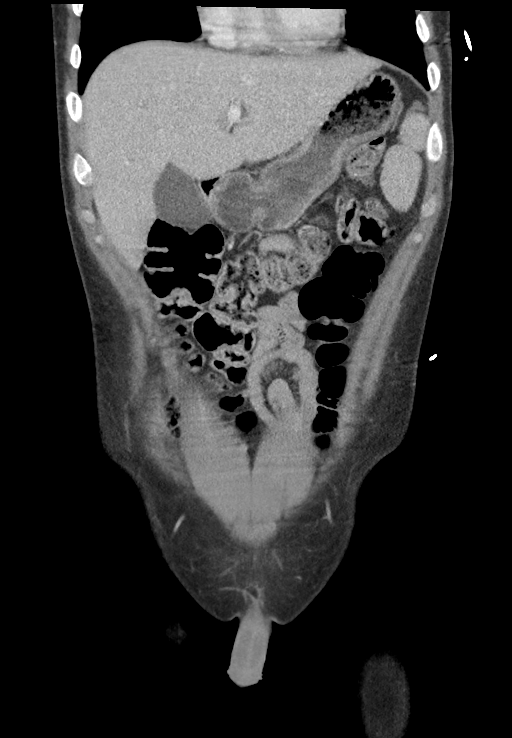
[im 41/93  soft-tissue]
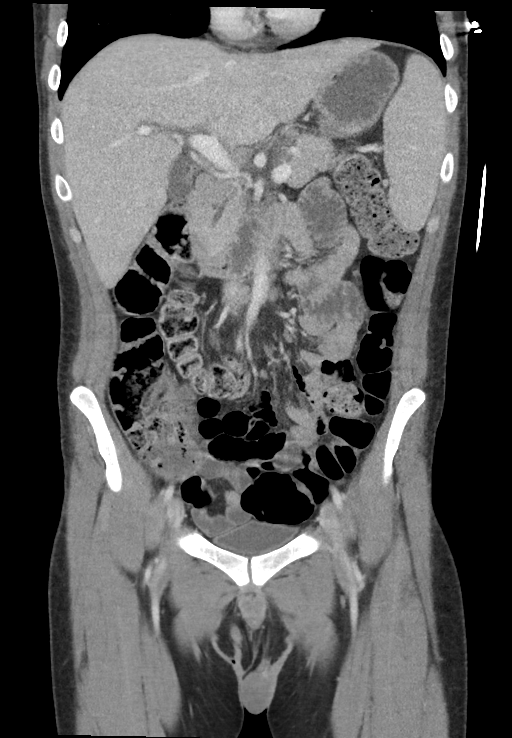
[im 52/93  soft-tissue]
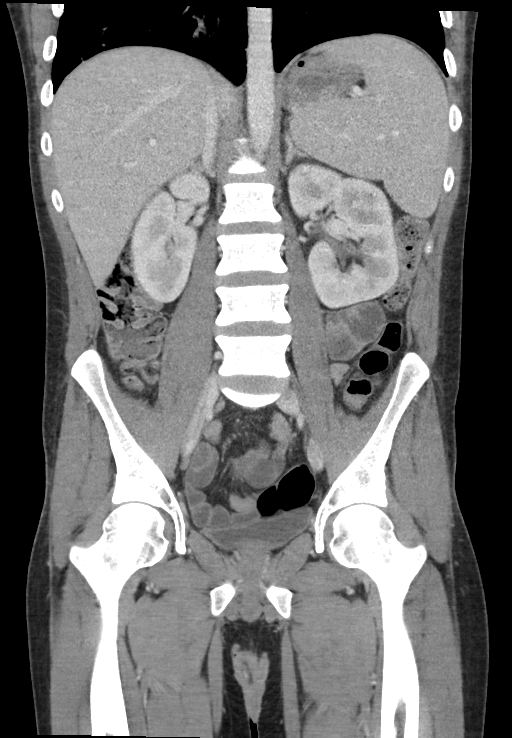

[15 of 46 positions shown; findings below may reference images not displayed]

RADIATION DOSE REDUCTION: This exam was performed according to the
departmental dose-optimization program which includes automated
exposure control, adjustment of the mA and/or kV according to
patient size and/or use of iterative reconstruction technique.

CONTRAST:  100mL OMNIPAQUE IOHEXOL 300 MG/ML  SOLN
FINDINGS: Lower chest: Scattered foci of irregular airspace disease and
ground-glass density slightly nodular in appearance at the bilateral
lower lobes. No pleural effusion.

Hepatobiliary: No focal liver abnormality is seen. No gallstones,
gallbladder wall thickening, or biliary dilatation.

Pancreas: Unremarkable. No pancreatic ductal dilatation or
surrounding inflammatory changes.

Spleen: Appears enlarged measuring 15 cm.

Adrenals/Urinary Tract: Adrenal glands are unremarkable. Kidneys are
normal, without renal calculi, focal lesion, or hydronephrosis.
Bladder is unremarkable.

Stomach/Bowel: Stomach is within normal limits. Appendix appears
normal. No evidence of bowel wall thickening, distention, or
inflammatory changes.

Vascular/Lymphatic: No significant vascular findings are present. No
enlarged abdominal or pelvic lymph nodes.

Reproductive: Prostate is unremarkable.

Other: Negative for pelvic effusion or free air.

Musculoskeletal: No acute or significant osseous findings.
IMPRESSION: 1. Multifocal small foci of irregular airspace disease and
ground-glass disease within the bilateral lower lobes with slightly
nodular appearance, suspected to be secondary to respiratory
infection, to include atypical organisms.
2. Splenomegaly

## 2021-11-11 IMAGING — DX DG CHEST 2V
2 series · 2 of 2 positions shown · non-contrast
Comparison: [DATE]

CLINICAL DATA: Nausea, vomiting and diarrhea

EXAM:
CHEST - 2 VIEW

[chest pa]
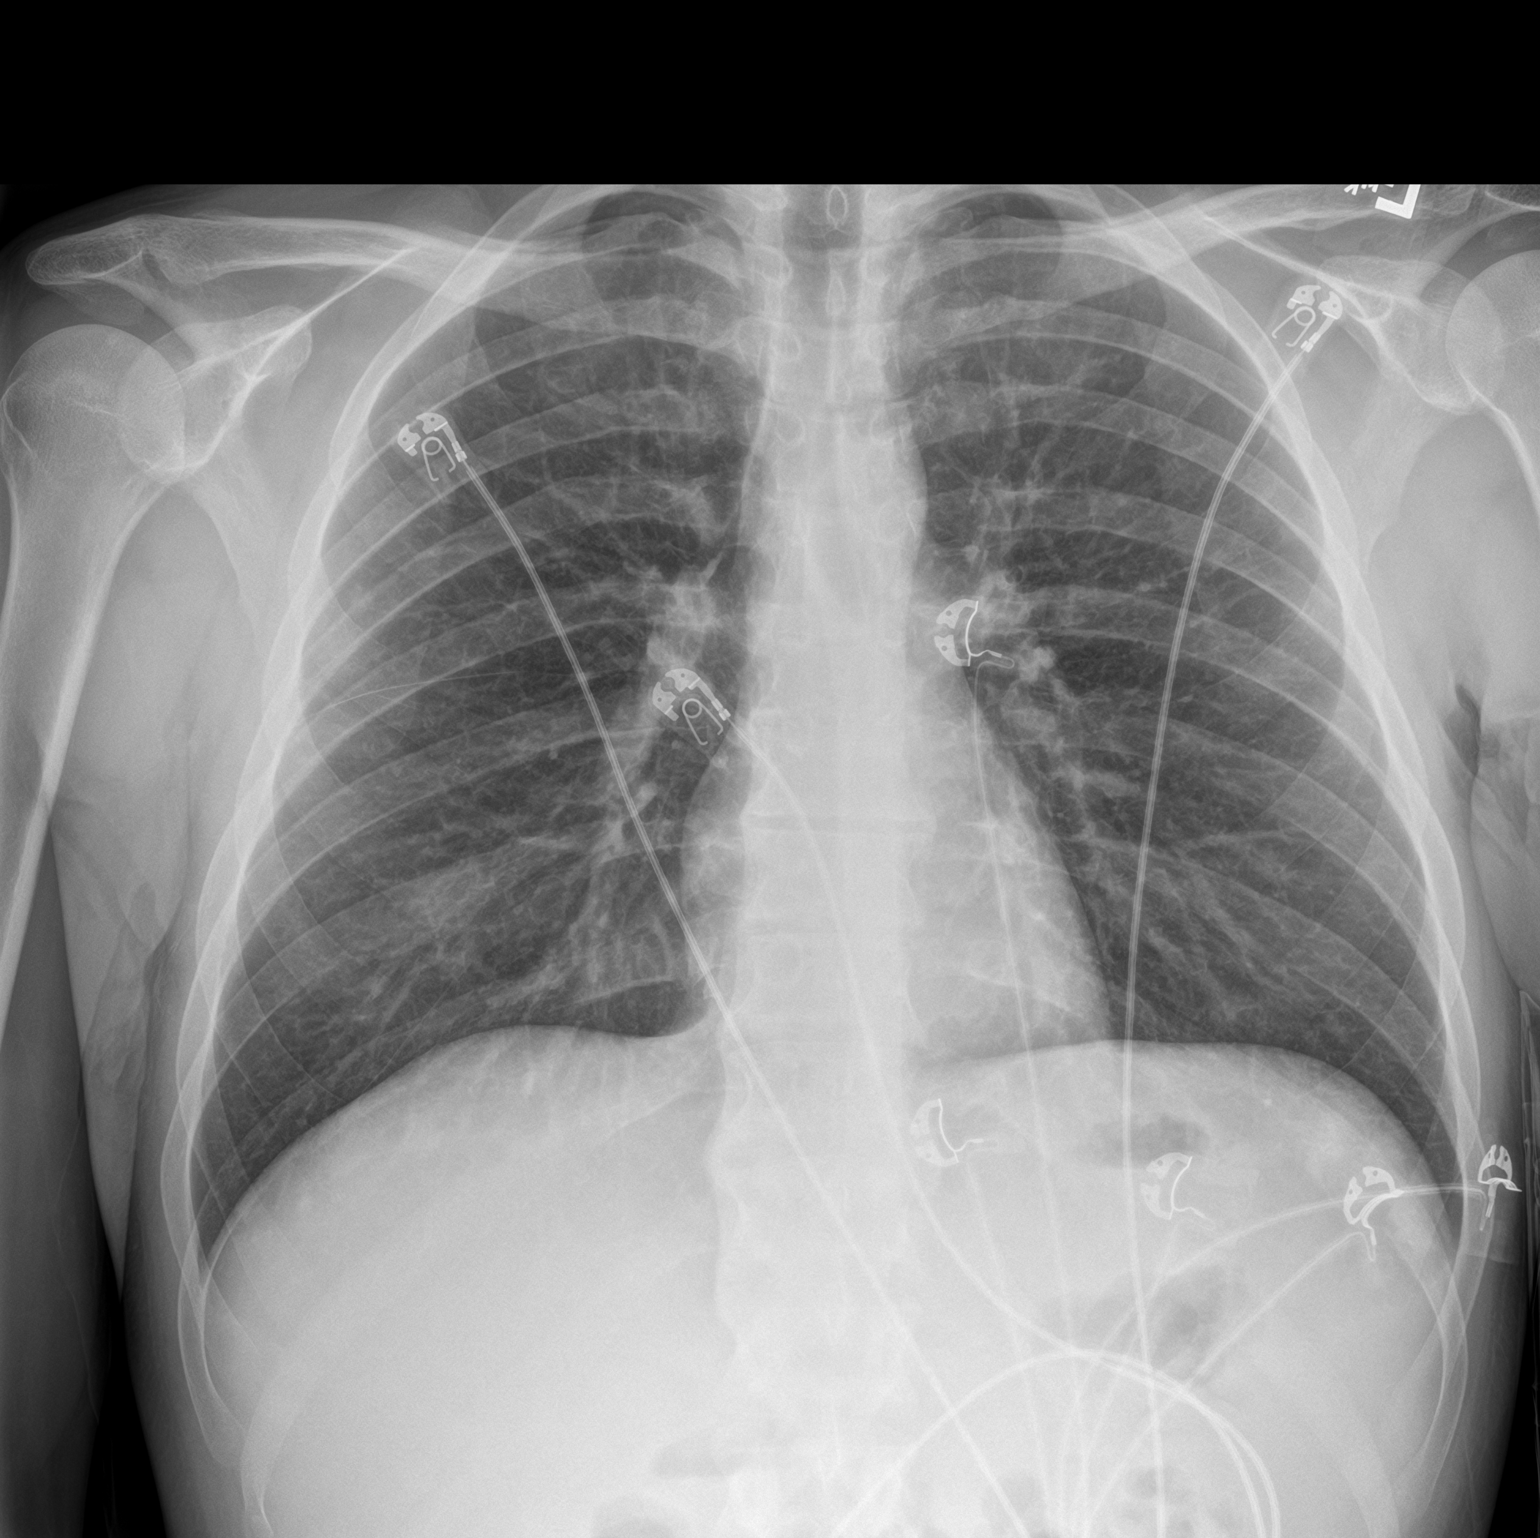

[chest lat]
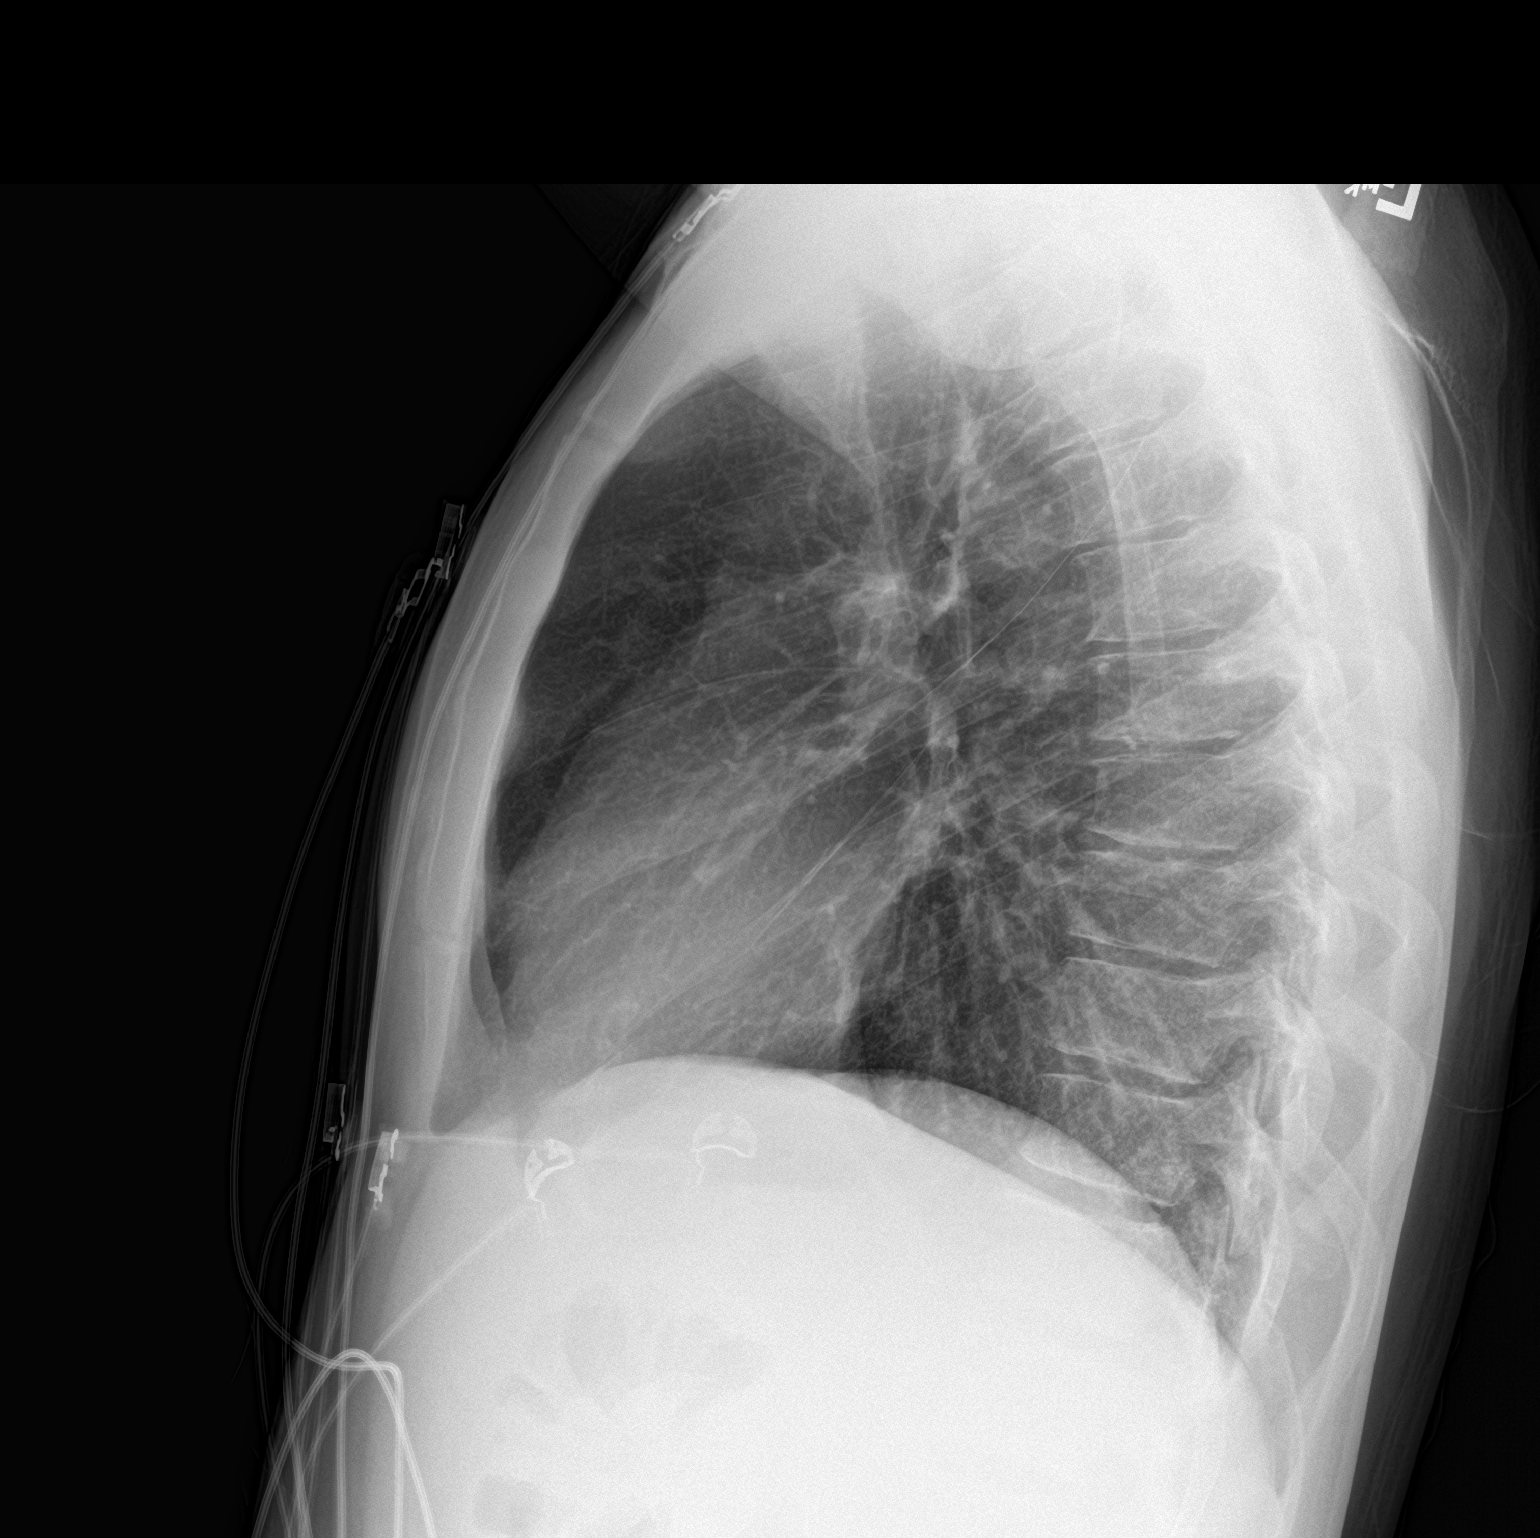

[2 of 2 positions shown; findings below may reference images not displayed]

FINDINGS: The heart size and mediastinal contours are within normal limits.
Both lungs are clear. The visualized skeletal structures are
unremarkable.
IMPRESSION: No active cardiopulmonary disease.

## 2021-11-11 MED ORDER — SODIUM CHLORIDE 0.9 % IV SOLN
500.0000 mg | INTRAVENOUS | Status: DC
  Administered 2021-11-11: 500 mg via INTRAVENOUS
  Filled 2021-11-11: qty 5

## 2021-11-11 MED ORDER — ACETAMINOPHEN 500 MG PO TABS
1000.0000 mg | ORAL_TABLET | Freq: Once | ORAL | Status: AC
  Administered 2021-11-11: 1000 mg via ORAL
  Filled 2021-11-11: qty 2

## 2021-11-11 MED ORDER — DOXYCYCLINE HYCLATE 100 MG PO CAPS: 100.0000 mg | ORAL_CAPSULE | Freq: Two times a day (BID) | ORAL | 0 refills | Status: DC

## 2021-11-11 MED ORDER — ONDANSETRON HCL 4 MG/2ML IJ SOLN
4.0000 mg | Freq: Once | INTRAMUSCULAR | Status: AC
  Administered 2021-11-11: 4 mg via INTRAVENOUS
  Filled 2021-11-11: qty 2

## 2021-11-11 MED ORDER — DICYCLOMINE HCL 10 MG PO CAPS
10.0000 mg | ORAL_CAPSULE | Freq: Once | ORAL | Status: AC
  Administered 2021-11-11: 10 mg via ORAL
  Filled 2021-11-11: qty 1

## 2021-11-11 MED ORDER — LACTATED RINGERS IV BOLUS
1000.0000 mL | Freq: Once | INTRAVENOUS | Status: AC
  Administered 2021-11-11: 1000 mL via INTRAVENOUS

## 2021-11-11 MED ORDER — IOHEXOL 300 MG/ML  SOLN
100.0000 mL | Freq: Once | INTRAMUSCULAR | Status: AC | PRN
  Administered 2021-11-11: 100 mL via INTRAVENOUS

## 2021-11-11 MED ORDER — MAGNESIUM SULFATE 2 GM/50ML IV SOLN
2.0000 g | Freq: Once | INTRAVENOUS | Status: AC
Start: 2021-11-11 — End: 2021-11-11
  Administered 2021-11-11: 2 g via INTRAVENOUS
  Filled 2021-11-11: qty 50

## 2021-11-11 MED ORDER — SODIUM CHLORIDE 0.9 % IV SOLN
2.0000 g | Freq: Once | INTRAVENOUS | Status: AC
  Administered 2021-11-11: 2 g via INTRAVENOUS
  Filled 2021-11-11: qty 20

## 2021-11-11 MED ORDER — POTASSIUM CHLORIDE CRYS ER 20 MEQ PO TBCR: 40.0000 meq | EXTENDED_RELEASE_TABLET | Freq: Two times a day (BID) | ORAL | 0 refills | Status: DC

## 2021-11-11 MED ORDER — POTASSIUM CHLORIDE 10 MEQ/100ML IV SOLN
10.0000 meq | INTRAVENOUS | Status: AC
  Administered 2021-11-11 (×3): 10 meq via INTRAVENOUS
  Filled 2021-11-11 (×3): qty 100

## 2021-11-11 NOTE — ED Notes (Signed)
Patient transported to CT 

## 2021-11-11 NOTE — ED Provider Notes (Signed)
?[facility]PARTMENT ?Provider Note ? ? ?CSN: [id number] ?Arrival date & time: 11/10/21  2227 ? ?  ? ?History ? ?No chief complaint on file. ? ? ?Reginald Clarke is a 31 y.o. male. ? ?31 year old male who presents to the ER today secondary to cough, fever, chills, body aches and just generalized malaise.  Patient states he has been feeling bad for about 4 to 5 days.  Feels like his fever broke yesterday but continues to feel weak so presents here for further evaluation.  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  Has not missed any doses.  States he did use IV methamphetamines a couple weeks ago.  Feels like the site is clean and does not share needles.  No chest pain.  No shortness of breath.  No rashes.  No sick contacts.  No GI symptoms.  No urinary symptoms aside from it being darker than normal. ? ? ? ?  ? ?Home Medications ?Prior to Admission medications   ?Medication Sig Start Date End Date Taking? Authorizing Provider  ?doxycycline (VIBRAMYCIN) 100 MG capsule Take 1 capsule (100 mg total) by mouth 2 (two) times daily. One po bid x 7 days 11/11/21  Yes Nasia Cannan, Barbara Cower, MD  ?potassium chloride SA (KLOR-CON M) 20 MEQ tablet Take 2 tablets (40 mEq total) by mouth 2 (two) times daily for 7 days. 11/11/21 11/18/21 Yes Annet Manukyan, Barbara Cower, MD  ?cetirizine-pseudoephedrine (ZYRTEC-D) 5-120 MG per tablet Take 1 tablet by mouth daily. ?Patient not taking: No sig reported 01/07/14   Ivery Quale, PA-C  ?chlorhexidine (HIBICLENS) 4 % external liquid Apply topically daily as needed. Mix a capful of hibiclens with water and submerge hand for 15-20, 3 times daily 12/08/20   Oliver Barre, MD  ?clindamycin (CLEOCIN) 150 MG capsule Take 2 capsules (300 mg total) by mouth 4 (four) times daily. 12/05/20   Triplett, Tammy, PA-C  ?dexamethasone (DECADRON) 4 MG tablet 1 po bid with food ?Patient not taking: No sig reported 01/07/14   Ivery Quale, PA-C  ?promethazine-codeine (PHENERGAN WITH  CODEINE) 6.25-10 MG/5ML syrup Take 5 mLs by mouth every 6 (six) hours as needed for cough. ?Patient not taking: No sig reported 01/07/14   Ivery Quale, PA-C  ?   ? ?Allergies    ?Amoxil [amoxicillin trihydrate] and Penicillins   ? ?Review of Systems   ?Review of Systems ? ?Physical Exam ?Updated Vital Signs ?BP (!) 95/56   Pulse 94   Temp 98.3 ?F (36.8 ?C)   Resp (!) 22   Ht 5\' 10"  (1.778 m)   Wt 70.8 kg   SpO2 100%   BMI 22.38 kg/m?  ?Physical Exam ?Vitals and nursing note reviewed.  ?Constitutional:   ?   Appearance: He is well-developed.  ?HENT:  ?   Head: Normocephalic and atraumatic.  ?   Nose: Nose normal. No congestion or rhinorrhea.  ?   Mouth/Throat:  ?   Mouth: Mucous membranes are moist.  ?   Pharynx: Oropharynx is clear.  ?Eyes:  ?   Extraocular Movements: Extraocular movements intact.  ?   Pupils: Pupils are equal, round, and reactive to light.  ?Cardiovascular:  ?   Rate and Rhythm: Tachycardia present.  ?Pulmonary:  ?   Effort: Pulmonary effort is normal. No respiratory distress.  ?Abdominal:  ?   General: Abdomen is flat. There is no distension.  ?Musculoskeletal:     ?   General: Normal range of motion.  ?  Cervical back: Normal range of motion.  ?Skin: ?   General: Skin is warm and dry.  ?   Coloration: Skin is pale.  ?Neurological:  ?   General: No focal deficit present.  ?   Mental Status: He is alert.  ?Psychiatric:     ?   Mood and Affect: Mood normal.  ? ? ?ED Results / Procedures / Treatments   ?Labs ?(all labs ordered are listed, but only abnormal results are displayed) ?Labs Reviewed  ?URINALYSIS, ROUTINE W REFLEX MICROSCOPIC - Abnormal; Notable for the following components:  ?    Result Value  ? Color, Urine AMBER (*)   ? APPearance HAZY (*)   ? Hgb urine dipstick SMALL (*)   ? Protein, ur 30 (*)   ? Leukocytes,Ua MODERATE (*)   ? WBC, UA >50 (*)   ? Non Squamous Epithelial 0-5 (*)   ? All other components within normal limits  ?BASIC METABOLIC PANEL - Abnormal; Notable for the  following components:  ? Sodium 130 (*)   ? Potassium 2.6 (*)   ? Chloride 89 (*)   ? Glucose, Bld 130 (*)   ? Calcium 8.4 (*)   ? All other components within normal limits  ?CBC - Abnormal; Notable for the following components:  ? WBC 13.3 (*)   ? RBC 4.15 (*)   ? Hemoglobin 12.3 (*)   ? HCT 36.9 (*)   ? Platelets 66 (*)   ? All other components within normal limits  ?HEPATIC FUNCTION PANEL - Abnormal; Notable for the following components:  ? Albumin 2.5 (*)   ? Bilirubin, Direct 0.3 (*)   ? All other components within normal limits  ?RESP PANEL BY RT-PCR (FLU A&B, COVID) [id number]  ?CULTURE, BLOOD (ROUTINE X 2)  ?CULTURE, BLOOD (ROUTINE X 2)  ?URINE CULTURE  ?LIPASE, BLOOD  ?MAGNESIUM  ?TROPONIN I (HIGH SENSITIVITY)  ?TROPONIN I (HIGH SENSITIVITY)  ? ? ?EKG ?EKG Interpretation ? ?Date/Time:  Sunday November 11 2021 00:26:12 EDT ?Ventricular Rate:  115 ?PR Interval:  126 ?QRS Duration: 102 ?QT Interval:  360 ?QTC Calculation: 498 ?R Axis:   14 ?Text Interpretation: Sinus tachycardia Nonspecific T abnormalities, lateral leads Prolonged QT interval Baseline wander in lead(s) II III aVF Confirmed by Marily Memos [phone]) on 11/11/2021 2:04:49 AM ? ?Radiology ?DG Chest 2 View ? ?Result Date: 11/11/2021 ?CLINICAL DATA:  Nausea, vomiting and diarrhea EXAM: CHEST - 2 VIEW COMPARISON:  02/24/2021 FINDINGS: The heart size and mediastinal contours are within normal limits. Both lungs are clear. The visualized skeletal structures are unremarkable. IMPRESSION: No active cardiopulmonary disease. Electronically Signed   By: Deatra Robinson M.D.   On: 11/11/2021 02:35  ? ?CT ABDOMEN PELVIS W CONTRAST ? ?Result Date: 11/11/2021 ?CLINICAL DATA:  Left lower quadrant pain EXAM: CT ABDOMEN AND PELVIS WITH CONTRAST TECHNIQUE: Multidetector CT imaging of the abdomen and pelvis was performed using the standard protocol following bolus administration of intravenous contrast. RADIATION DOSE REDUCTION: This exam was performed according to the  departmental dose-optimization program which includes automated exposure control, adjustment of the mA and/or kV according to patient size and/or use of iterative reconstruction technique. CONTRAST:  OMNIPAQUE IOHEXOL 300 MG/ML  SOLN COMPARISON:  None. FINDINGS: Lower chest: Scattered foci of irregular airspace disease and ground-glass density slightly nodular in appearance at the bilateral lower lobes. No pleural effusion. Hepatobiliary: No focal liver abnormality is seen. No gallstones, gallbladder wall thickening, or biliary dilatation. Pancreas: Unremarkable. No pancreatic ductal dilatation or  surrounding inflammatory changes. Spleen: Appears enlarged measuring 15 cm. Adrenals/Urinary Tract: Adrenal glands are unremarkable. Kidneys are normal, without renal calculi, focal lesion, or hydronephrosis. Bladder is unremarkable. Stomach/Bowel: Stomach is within normal limits. Appendix appears normal. No evidence of bowel wall thickening, distention, or inflammatory changes. Vascular/Lymphatic: No significant vascular findings are present. No enlarged abdominal or pelvic lymph nodes. Reproductive: Prostate is unremarkable. Other: Negative for pelvic effusion or free air. Musculoskeletal: No acute or significant osseous findings. IMPRESSION: 1. Multifocal small foci of irregular airspace disease and ground-glass disease within the bilateral lower lobes with slightly nodular appearance, suspected to be secondary to respiratory infection, to include atypical organisms. 2. Splenomegaly Electronically Signed   By: Jasmine PangKim  Fujinaga M.D.   On: 11/11/2021 01:20   ? ?Procedures ?Procedures  ? ? ?Medications Ordered in ED ?Medications  ?azithromycin (ZITHROMAX) 500 mg in sodium chloride 0.9 % 250 mL IVPB (0 mg Intravenous Stopped 11/11/21 0324)  ?lactated ringers bolus 1,000 mL (0 mLs Intravenous Stopped 11/11/21 0123)  ?ondansetron [facility](ZOFRAN) injection 4 mg (4 mg Intravenous Given 11/11/21 0029)  ?dicyclomine (BENTYL) capsule 10 mg  (10 mg Oral Given 11/11/21 0029)  ?potassium chloride 10 mEq in 100 mL IVPB (0 mEq Intravenous Stopped 11/11/21 0413)  ?magnesium sulfate IVPB 2 g 50 mL (0 g Intravenous Stopped 11/11/21 0222)  ?iohexol (OMNIPAQUE) 300 MG/ML solut

## 2021-11-11 NOTE — ED Notes (Addendum)
Note made on 11/08/21 at 1211  ? ?Critical value called to another RN this morning at 1115. Dr. Effie Shy was notified and said to call pt and have them return to ED. Pt called, no answer, voicemail full. Emergency contact called as well, no answer, no voicemail.  ?

## 2021-11-13 ENCOUNTER — Telehealth (HOSPITAL_COMMUNITY): Payer: Self-pay | Admitting: Pharmacist

## 2021-11-13 LAB — URINE CULTURE: Culture: 20000 — AB

## 2021-11-13 LAB — CULTURE, BLOOD (ROUTINE X 2): Special Requests: ADEQUATE

## 2021-11-13 NOTE — Telephone Encounter (Signed)
Called 4/11 at 0850. Patient did not answer and I was unable to leave a message due to the full voicemail. ?

## 2021-11-14 ENCOUNTER — Inpatient Hospital Stay (HOSPITAL_COMMUNITY)
Admission: EM | Admit: 2021-11-14 | Discharge: 2021-12-12 | DRG: 853 | Disposition: A | Discharge status: Home / Self Care | Payer: BLUE CROSS/BLUE SHIELD | Attending: Internal Medicine | Admitting: Internal Medicine

## 2021-11-14 ENCOUNTER — Encounter (HOSPITAL_COMMUNITY): Payer: Self-pay

## 2021-11-14 ENCOUNTER — Other Ambulatory Visit: Payer: Self-pay

## 2021-11-14 ENCOUNTER — Telehealth (HOSPITAL_COMMUNITY): Payer: Self-pay | Admitting: Pharmacist

## 2021-11-14 ENCOUNTER — Emergency Department (HOSPITAL_COMMUNITY): Payer: BLUE CROSS/BLUE SHIELD

## 2021-11-14 DIAGNOSIS — R7881 Bacteremia: Secondary | ICD-10-CM | POA: Diagnosis present

## 2021-11-14 DIAGNOSIS — D649 Anemia, unspecified: Secondary | ICD-10-CM | POA: Diagnosis not present

## 2021-11-14 DIAGNOSIS — K219 Gastro-esophageal reflux disease without esophagitis: Secondary | ICD-10-CM

## 2021-11-14 DIAGNOSIS — I361 Nonrheumatic tricuspid (valve) insufficiency: Secondary | ICD-10-CM | POA: Diagnosis not present

## 2021-11-14 DIAGNOSIS — Z720 Tobacco use: Secondary | ICD-10-CM | POA: Diagnosis present

## 2021-11-14 DIAGNOSIS — Z79899 Other long term (current) drug therapy: Secondary | ICD-10-CM

## 2021-11-14 DIAGNOSIS — Z880 Allergy status to penicillin: Secondary | ICD-10-CM | POA: Diagnosis not present

## 2021-11-14 DIAGNOSIS — I511 Rupture of chordae tendineae, not elsewhere classified: Secondary | ICD-10-CM | POA: Diagnosis present

## 2021-11-14 DIAGNOSIS — I330 Acute and subacute infective endocarditis: Secondary | ICD-10-CM | POA: Diagnosis present

## 2021-11-14 DIAGNOSIS — B182 Chronic viral hepatitis C: Secondary | ICD-10-CM | POA: Diagnosis present

## 2021-11-14 DIAGNOSIS — A4101 Sepsis due to Methicillin susceptible Staphylococcus aureus: Secondary | ICD-10-CM | POA: Diagnosis not present

## 2021-11-14 DIAGNOSIS — I079 Rheumatic tricuspid valve disease, unspecified: Secondary | ICD-10-CM | POA: Diagnosis present

## 2021-11-14 DIAGNOSIS — G894 Chronic pain syndrome: Secondary | ICD-10-CM | POA: Diagnosis present

## 2021-11-14 DIAGNOSIS — J1100 Influenza due to unidentified influenza virus with unspecified type of pneumonia: Secondary | ICD-10-CM | POA: Diagnosis present

## 2021-11-14 DIAGNOSIS — J918 Pleural effusion in other conditions classified elsewhere: Secondary | ICD-10-CM | POA: Diagnosis present

## 2021-11-14 DIAGNOSIS — M545 Low back pain, unspecified: Secondary | ICD-10-CM | POA: Diagnosis present

## 2021-11-14 DIAGNOSIS — F17210 Nicotine dependence, cigarettes, uncomplicated: Secondary | ICD-10-CM | POA: Diagnosis present

## 2021-11-14 DIAGNOSIS — I340 Nonrheumatic mitral (valve) insufficiency: Secondary | ICD-10-CM | POA: Diagnosis not present

## 2021-11-14 DIAGNOSIS — F1210 Cannabis abuse, uncomplicated: Secondary | ICD-10-CM | POA: Diagnosis present

## 2021-11-14 DIAGNOSIS — I2690 Septic pulmonary embolism without acute cor pulmonale: Secondary | ICD-10-CM | POA: Diagnosis present

## 2021-11-14 DIAGNOSIS — F1120 Opioid dependence, uncomplicated: Secondary | ICD-10-CM | POA: Diagnosis present

## 2021-11-14 DIAGNOSIS — B9561 Methicillin susceptible Staphylococcus aureus infection as the cause of diseases classified elsewhere: Secondary | ICD-10-CM | POA: Diagnosis present

## 2021-11-14 DIAGNOSIS — M549 Dorsalgia, unspecified: Secondary | ICD-10-CM | POA: Diagnosis not present

## 2021-11-14 DIAGNOSIS — K000 Anodontia: Secondary | ICD-10-CM | POA: Diagnosis present

## 2021-11-14 DIAGNOSIS — Z20822 Contact with and (suspected) exposure to covid-19: Secondary | ICD-10-CM | POA: Diagnosis present

## 2021-11-14 DIAGNOSIS — I339 Acute and subacute endocarditis, unspecified: Secondary | ICD-10-CM | POA: Diagnosis not present

## 2021-11-14 DIAGNOSIS — A4102 Sepsis due to Methicillin resistant Staphylococcus aureus: Secondary | ICD-10-CM | POA: Diagnosis not present

## 2021-11-14 DIAGNOSIS — F1990 Other psychoactive substance use, unspecified, uncomplicated: Secondary | ICD-10-CM | POA: Diagnosis present

## 2021-11-14 DIAGNOSIS — Z952 Presence of prosthetic heart valve: Secondary | ICD-10-CM | POA: Diagnosis present

## 2021-11-14 DIAGNOSIS — F1910 Other psychoactive substance abuse, uncomplicated: Secondary | ICD-10-CM | POA: Diagnosis present

## 2021-11-14 DIAGNOSIS — J90 Pleural effusion, not elsewhere classified: Secondary | ICD-10-CM | POA: Diagnosis not present

## 2021-11-14 DIAGNOSIS — F419 Anxiety disorder, unspecified: Secondary | ICD-10-CM | POA: Diagnosis present

## 2021-11-14 DIAGNOSIS — Z881 Allergy status to other antibiotic agents status: Secondary | ICD-10-CM | POA: Diagnosis not present

## 2021-11-14 DIAGNOSIS — I360 Nonrheumatic tricuspid (valve) stenosis: Secondary | ICD-10-CM | POA: Diagnosis not present

## 2021-11-14 DIAGNOSIS — Z87891 Personal history of nicotine dependence: Secondary | ICD-10-CM | POA: Diagnosis present

## 2021-11-14 DIAGNOSIS — F1510 Other stimulant abuse, uncomplicated: Secondary | ICD-10-CM | POA: Diagnosis present

## 2021-11-14 DIAGNOSIS — J189 Pneumonia, unspecified organism: Secondary | ICD-10-CM | POA: Diagnosis present

## 2021-11-14 DIAGNOSIS — B9562 Methicillin resistant Staphylococcus aureus infection as the cause of diseases classified elsewhere: Secondary | ICD-10-CM | POA: Diagnosis present

## 2021-11-14 HISTORY — DX: Inflammatory liver disease, unspecified: K75.9

## 2021-11-14 LAB — CBC WITH DIFFERENTIAL/PLATELET
Abs Immature Granulocytes: 0.08 10*3/uL — ABNORMAL HIGH (ref 0.00–0.07)
Basophils Absolute: 0 10*3/uL (ref 0.0–0.1)
Basophils Relative: 0 %
Eosinophils Absolute: 0 10*3/uL (ref 0.0–0.5)
Eosinophils Relative: 0 %
HCT: 33.8 % — ABNORMAL LOW (ref 39.0–52.0)
Hemoglobin: 10.9 g/dL — ABNORMAL LOW (ref 13.0–17.0)
Immature Granulocytes: 1 %
Lymphocytes Relative: 15 %
Lymphs Abs: 1.2 10*3/uL (ref 0.7–4.0)
MCH: 29.4 pg (ref 26.0–34.0)
MCHC: 32.2 g/dL (ref 30.0–36.0)
MCV: 91.1 fL (ref 80.0–100.0)
Monocytes Absolute: 0.7 10*3/uL (ref 0.1–1.0)
Monocytes Relative: 9 %
Neutro Abs: 5.7 10*3/uL (ref 1.7–7.7)
Neutrophils Relative %: 75 %
Platelets: 176 10*3/uL (ref 150–400)
RBC: 3.71 MIL/uL — ABNORMAL LOW (ref 4.22–5.81)
RDW: 14.3 % (ref 11.5–15.5)
WBC: 7.7 10*3/uL (ref 4.0–10.5)
nRBC: 0 % (ref 0.0–0.2)

## 2021-11-14 LAB — SEDIMENTATION RATE: Sed Rate: 90 mm/hr — ABNORMAL HIGH (ref 0–16)

## 2021-11-14 LAB — C-REACTIVE PROTEIN: CRP: 15.4 mg/dL — ABNORMAL HIGH (ref <1.0)

## 2021-11-14 LAB — HEPATITIS B SURFACE ANTIGEN: Hepatitis B Surface Ag: NONREACTIVE

## 2021-11-14 LAB — BASIC METABOLIC PANEL
Anion gap: 6 (ref 5–15)
BUN: 7 mg/dL (ref 6–20)
CO2: 27 mmol/L (ref 22–32)
Calcium: 8 mg/dL — ABNORMAL LOW (ref 8.9–10.3)
Chloride: 99 mmol/L (ref 98–111)
Creatinine, Ser: 0.64 mg/dL (ref 0.61–1.24)
GFR, Estimated: >60 mL/min (ref >60)
Glucose, Bld: 85 mg/dL (ref 70–99)
Potassium: 4 mmol/L (ref 3.5–5.1)
Sodium: 132 mmol/L — ABNORMAL LOW (ref 135–145)

## 2021-11-14 LAB — HEPATITIS A ANTIBODY, TOTAL: hep A Total Ab: REACTIVE — AB

## 2021-11-14 IMAGING — DX DG CHEST 2V
2 series · 2 of 2 positions shown · non-contrast
Comparison: [DATE].

CLINICAL DATA: PNA monitoring

EXAM:
CHEST - 2 VIEW

[chest pa]
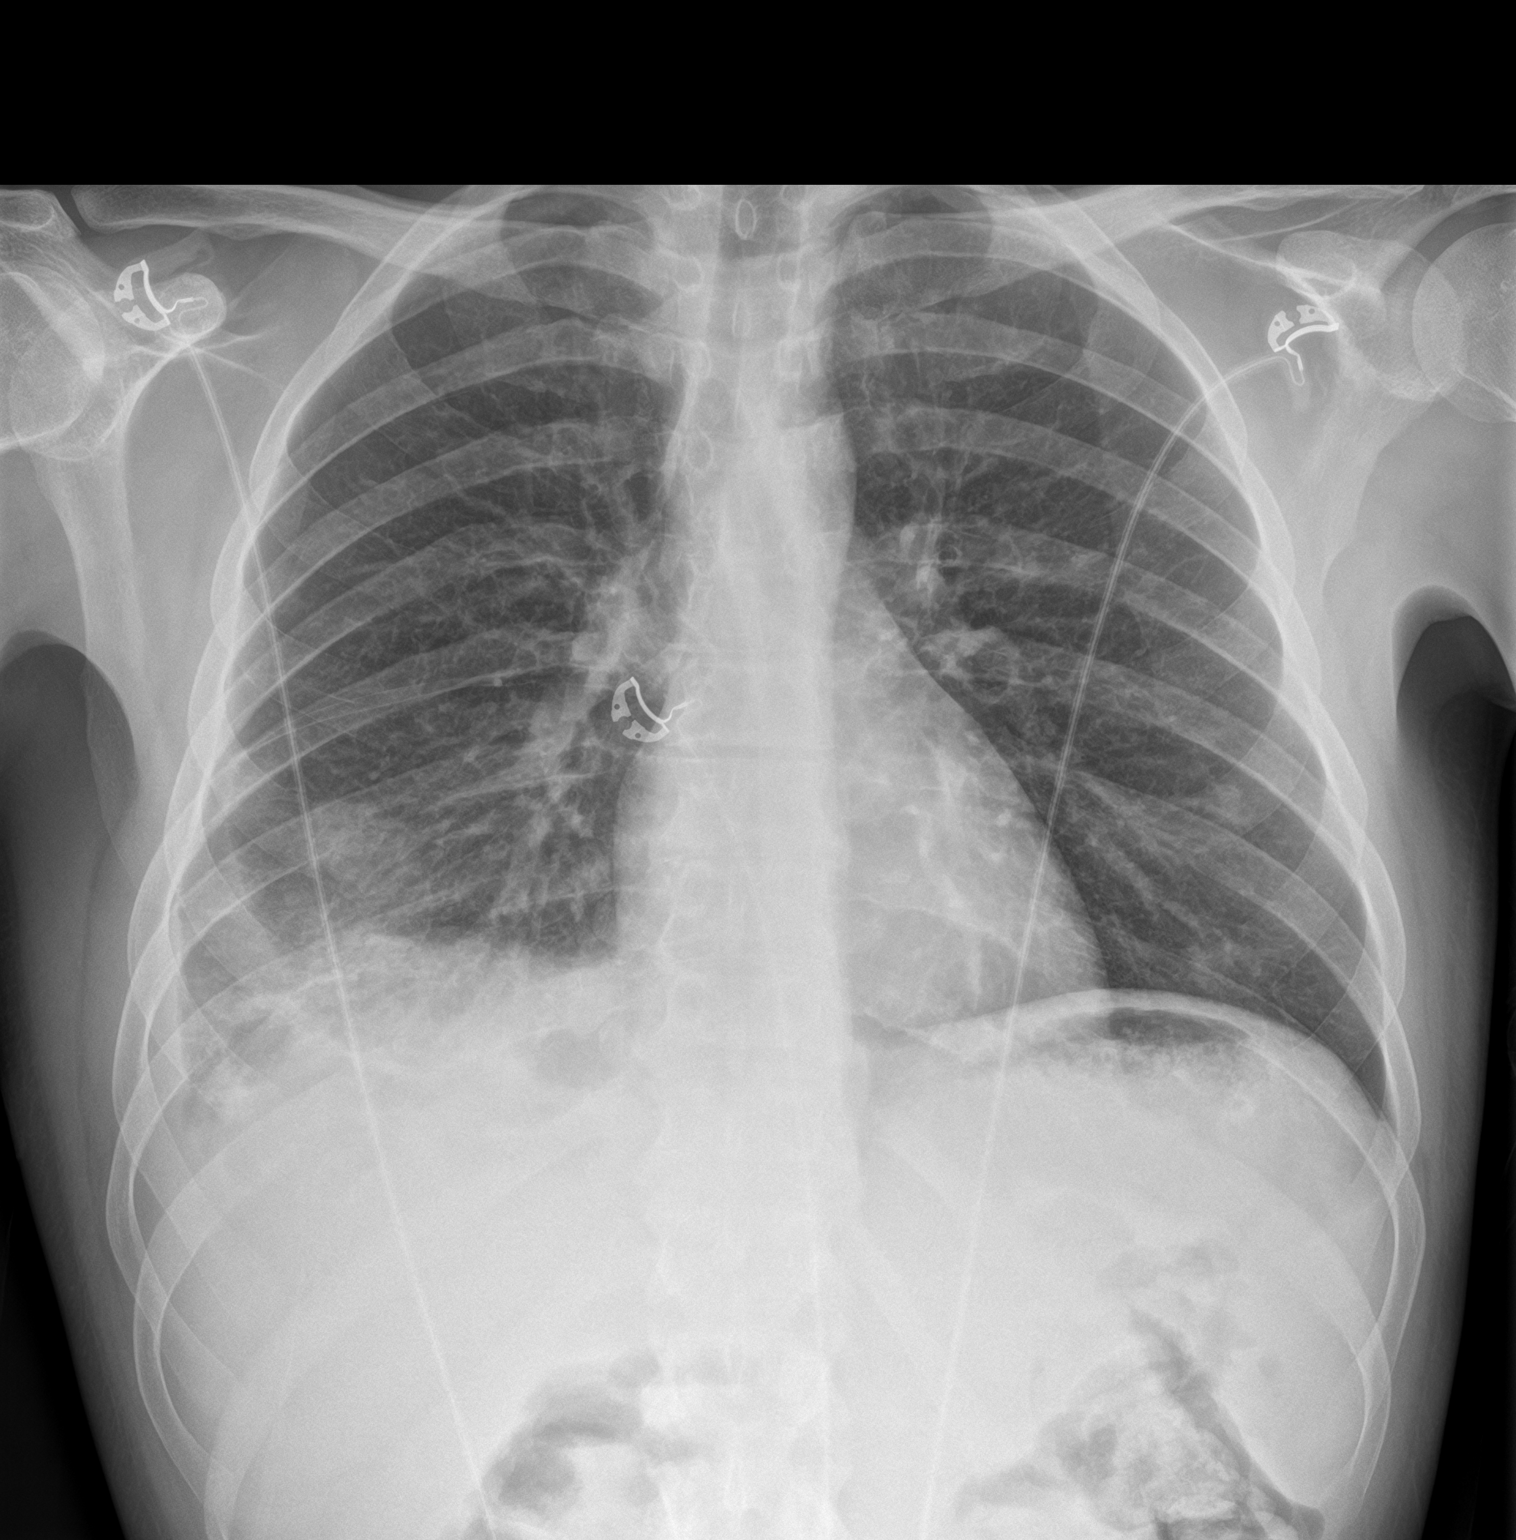

[chest lat]
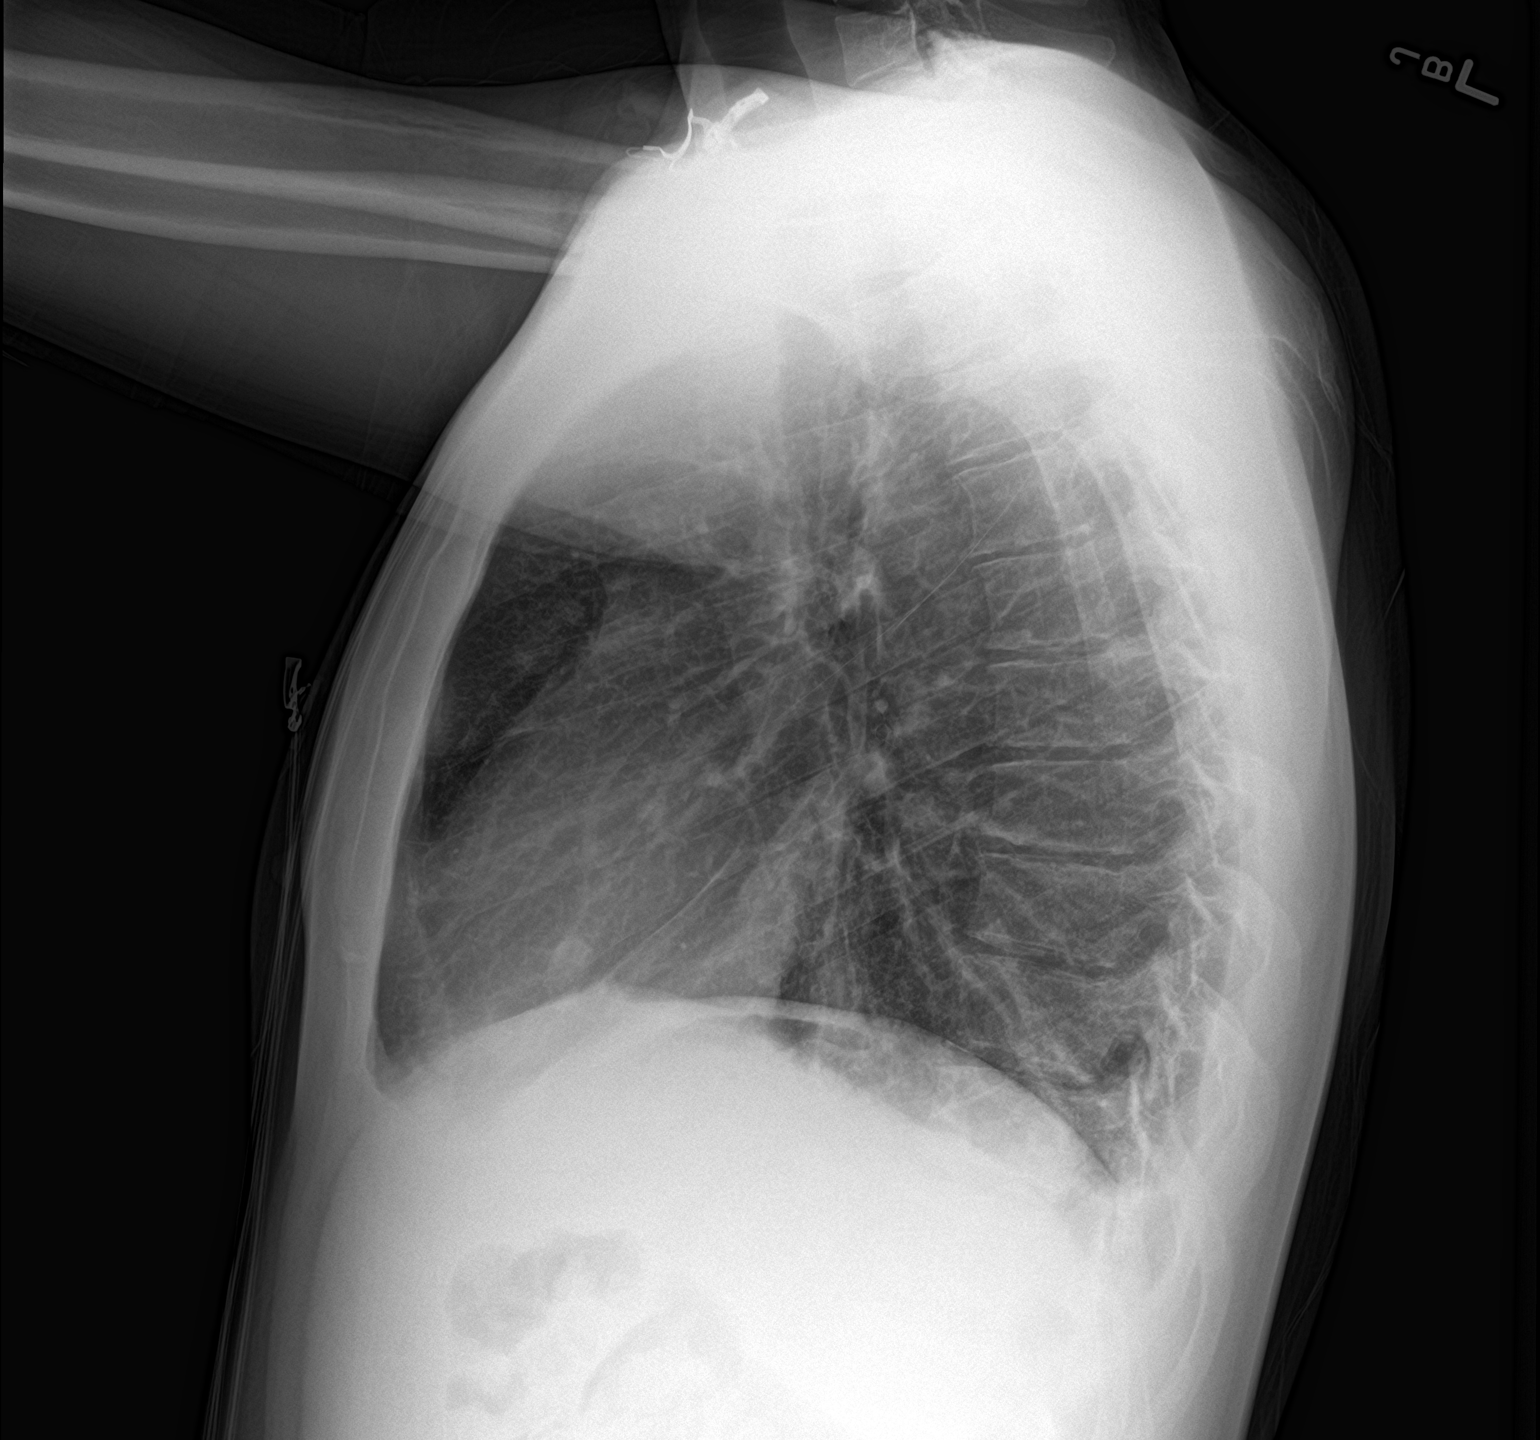

[2 of 2 positions shown; findings below may reference images not displayed]

FINDINGS: New right basilar airspace opacities. New small right pleural
effusion. Left lung is clear. No visible pneumothorax.
Cardiomediastinal silhouette is within normal limits.
IMPRESSION: Findings compatible with right basilar pneumonia and small right
parapneumonic effusion.

## 2021-11-14 MED ORDER — OXYCODONE HCL 5 MG PO TABS
10.0000 mg | ORAL_TABLET | Freq: Four times a day (QID) | ORAL | Status: DC | PRN
  Administered 2021-11-14: 10 mg via ORAL
  Filled 2021-11-14: qty 2

## 2021-11-14 MED ORDER — GUAIFENESIN-DM 100-10 MG/5ML PO SYRP: 5.0000 mL | ORAL_SOLUTION | ORAL | Status: DC | PRN

## 2021-11-14 MED ORDER — ONDANSETRON HCL 4 MG/2ML IJ SOLN
4.0000 mg | Freq: Four times a day (QID) | INTRAMUSCULAR | Status: DC | PRN
  Administered 2021-11-29: 4 mg via INTRAVENOUS
  Filled 2021-11-14: qty 2

## 2021-11-14 MED ORDER — ACETAMINOPHEN 650 MG RE SUPP: 650.0000 mg | Freq: Four times a day (QID) | RECTAL | Status: DC | PRN

## 2021-11-14 MED ORDER — ACETAMINOPHEN 325 MG PO TABS
650.0000 mg | ORAL_TABLET | Freq: Four times a day (QID) | ORAL | Status: DC | PRN
Start: 2021-11-14 — End: 2021-12-12
  Administered 2021-11-14 – 2021-11-20 (×4): 650 mg via ORAL
  Filled 2021-11-14 (×4): qty 2

## 2021-11-14 MED ORDER — IPRATROPIUM-ALBUTEROL 0.5-2.5 (3) MG/3ML IN SOLN: 3.0000 mL | Freq: Four times a day (QID) | RESPIRATORY_TRACT | Status: DC | PRN

## 2021-11-14 MED ORDER — PANTOPRAZOLE SODIUM 40 MG PO TBEC
40.0000 mg | DELAYED_RELEASE_TABLET | Freq: Every day | ORAL | Status: DC
  Administered 2021-11-15 – 2021-12-12 (×27): 40 mg via ORAL
  Filled 2021-11-14 (×26): qty 1

## 2021-11-14 MED ORDER — ENOXAPARIN SODIUM 40 MG/0.4ML IJ SOSY
40.0000 mg | PREFILLED_SYRINGE | INTRAMUSCULAR | Status: DC
  Administered 2021-11-14 – 2021-11-23 (×9): 40 mg via SUBCUTANEOUS
  Filled 2021-11-14 (×10): qty 0.4

## 2021-11-14 MED ORDER — BUPRENORPHINE HCL-NALOXONE HCL 8-2 MG SL SUBL: 1.0000 | SUBLINGUAL_TABLET | Freq: Every day | SUBLINGUAL | Status: DC

## 2021-11-14 MED ORDER — KETOROLAC TROMETHAMINE 30 MG/ML IJ SOLN
30.0000 mg | Freq: Four times a day (QID) | INTRAMUSCULAR | Status: AC | PRN
  Administered 2021-11-14 – 2021-11-19 (×8): 30 mg via INTRAVENOUS
  Filled 2021-11-14 (×8): qty 1

## 2021-11-14 MED ORDER — CEFAZOLIN SODIUM-DEXTROSE 2-4 GM/100ML-% IV SOLN
2.0000 g | Freq: Three times a day (TID) | INTRAVENOUS | Status: DC
  Administered 2021-11-14 – 2021-11-23 (×28): 2 g via INTRAVENOUS
  Filled 2021-11-14 (×30): qty 100

## 2021-11-14 MED ORDER — CEFAZOLIN SODIUM 1 G IJ SOLR: 2.0000 g | Freq: Three times a day (TID) | INTRAMUSCULAR | Status: DC

## 2021-11-14 MED ORDER — ONDANSETRON HCL 4 MG PO TABS
4.0000 mg | ORAL_TABLET | Freq: Four times a day (QID) | ORAL | Status: DC | PRN
  Administered 2021-11-23 – 2021-11-25 (×2): 4 mg via ORAL
  Filled 2021-11-14 (×2): qty 1

## 2021-11-14 MED ORDER — METHOCARBAMOL 500 MG PO TABS
500.0000 mg | ORAL_TABLET | Freq: Three times a day (TID) | ORAL | Status: DC | PRN
  Administered 2021-11-23 – 2021-12-10 (×35): 500 mg via ORAL
  Filled 2021-11-14 (×35): qty 1

## 2021-11-14 MED ORDER — VANCOMYCIN HCL 1500 MG/300ML IV SOLN: 1500.0000 mg | Freq: Two times a day (BID) | INTRAVENOUS | Status: DC

## 2021-11-14 NOTE — H&P (Signed)
?History and Physical  ? ? ?Patient: Reginald Clarke DOB: 05-18-1991 ?DOA: 11/14/2021 ?DOS: the patient was seen and examined on 11/14/2021 ?PCP: Pcp, No  ?Patient coming from: Home ? ?Chief Complaint:  ?Chief Complaint  ?Patient presents with  ? abdnormal lab  ? ?HPI: Reginald Clarke is a 31 y.o. male with medical history significant of IV drug use, gastroesophageal flux disease, chronic pain (follow-up as an outpatient for methadone clinic) and recent ED visit secondary to community-acquired pneumonia (discharged with intention to be treated with doxycycline as an outpatient); who presented after being called with abnormal blood culture results resembling acute staph virus infection and instructed to return to the emergency department for further evaluation and management.  Patient reported good oxygen saturation on room air and no fever; still complaining of intermittent coughing spells and some pleuritic discomfort.  Also reported chronic ongoing lower back pain. ?Patient expressed no abdominal pain, no nausea, no vomiting, no dysuria, no hematuria, no melena, no hematochezia, no focal weaknesses or any other complaints. ? ?Repeat chest x-ray demonstrating right basilar pneumonia and a small right pleural effusion.  Urinalysis showing pyuria with negative nitrites; negative COVID PCR and influenza. ? ?CBC demonstrating WBC 7.7, hemoglobin 10.9 and platelets count 176 K ?Basic metabolic panel with a sodium of 132, potassium 4.0, chloride 99, bicarb 27, BUN 7, creatinine 0.64 ? ? ? ?Review of Systems: As mentioned in the history of present illness. All other systems reviewed and are negative. ? ?History reviewed. No pertinent past medical history. ?Past Surgical History:  ?Procedure Laterality Date  ? tubes in ears    ? WRIST FRACTURE SURGERY    ? ?Social History:  reports that he has been smoking cigarettes. He has never used smokeless tobacco. He reports that he does not drink alcohol and does not use  drugs. ? ?Allergies  ?Allergen Reactions  ? Penicillins Hives  ? Amoxil [Amoxicillin Trihydrate]   ? ?Family history: ?-Patient reports hx of HTN; otherwise denies any other problems. ? ?Prior to Admission medications   ?Medication Sig Start Date End Date Taking? Authorizing Provider  ?methadone (DOLOPHINE) 1 MG/1ML solution Take 135 mg by mouth daily. 04/24/20  Yes [provider]  ?cetirizine-pseudoephedrine (ZYRTEC-D) 5-120 MG per tablet Take 1 tablet by mouth daily. ?Patient not taking: Reported on 12/05/2020 01/07/14   Lily Kocher, PA-C  ?chlorhexidine (HIBICLENS) 4 % external liquid Apply topically daily as needed. Mix a capful of hibiclens with water and submerge hand for 15-20, 3 times daily ?Patient not taking: Reported on 11/14/2021 12/08/20   Mordecai Rasmussen, MD  ?clindamycin (CLEOCIN) 150 MG capsule Take 2 capsules (300 mg total) by mouth 4 (four) times daily. ?Patient not taking: Reported on 11/14/2021 12/05/20   Kem Parkinson, PA-C  ?dexamethasone (DECADRON) 4 MG tablet 1 po bid with food ?Patient not taking: Reported on 12/05/2020 01/07/14   Lily Kocher, PA-C  ?doxycycline (VIBRAMYCIN) 100 MG capsule Take 1 capsule (100 mg total) by mouth 2 (two) times daily. One po bid x 7 days ?Patient not taking: Reported on 11/14/2021 11/11/21   Mesner, Corene Cornea, MD  ?potassium chloride SA (KLOR-CON M) 20 MEQ tablet Take 2 tablets (40 mEq total) by mouth 2 (two) times daily for 7 days. ?Patient not taking: Reported on 11/14/2021 11/11/21 11/18/21  Mesner, Corene Cornea, MD  ?promethazine-codeine [facility] WITH CODEINE) 6.25-10 MG/5ML syrup Take 5 mLs by mouth every 6 (six) hours as needed for cough. ?Patient not taking: Reported on 12/05/2020 01/07/14   Lily Kocher, PA-C  ? ? ?  Physical Exam: ?Vitals:  ? 11/14/21 1800 11/14/21 1815 11/14/21 1830 11/14/21 1930  ?BP: 114/77  122/75 117/68  ?Pulse: (!) 108 (!) 114 (!) 107 98  ?Resp: 20 19 (!) 22 20  ?Temp:      ?TempSrc:      ?SpO2: 98% 99% 100% 97%  ?Weight:      ?Height:       ? ?General exam: Alert, awake, oriented x 3; no nausea, no vomiting, normal O2 sat on RA. ?Respiratory system: Positive rhonchi appreciated in his right lower lobe; no wheezing and clear to auscultation on his left lung field.  No using accessory muscles. ?Cardiovascular system:RRR. No murmurs, rubs, gallops.  No JVD. ?Gastrointestinal system: Abdomen is nondistended, soft and nontender. No organomegaly or masses felt. Normal bowel sounds heard. ?Central nervous system: Alert and oriented. No focal neurological deficits. ?Extremities: No cyanosis or clubbing. ?Skin: No rashes, no petechiae. ?Psychiatry: Judgement and insight appear normal. Mood & affect appropriate.  ? ?Data Reviewed: ?Chest x-ray demonstrating right basilar pneumonia and a small right pleural effusion.  Urinalysis showing pyuria with negative nitrites; negative COVID PCR and influenza. ?CBC demonstrating WBC 7.7, hemoglobin 10.9 and platelets count 176 K ?Basic metabolic panel with a sodium of 132, potassium 4.0, chloride 99, bicarb 27, BUN 7, creatinine 0.64 ? ?Assessment and Plan: ?* Bacteremia ?-Prior history of IV drug abuse with recent relapse. ?-Recent ED visit (approximately 3-4 days ago due to URI symptoms and diagnosis of community-acquired pneumonia. ?-Patient with low curb 65 treated as an outpatient with doxycycline. ?-Presented back with positive blood cultures at that time suggesting MSSA bacteremia. ?-Infectious disease has been involved recommending treatment with Ancef, repeat blood cultures, 2D echo and to assure no other source of infection. ?-Currently afebrile and with normal WBCs. ?-Follow clinical response. ? ?GERD (gastroesophageal reflux disease) ?-continue PPI ? ?CAP (community acquired pneumonia) ?- Partially treated with the use doxycycline as an outpatient ?-Will currently be receiving Ancef which will also provide appropriate coverage for pneumonia. ?-Continue as needed bronchodilators and supportive care. ?-Good  saturation on room air appreciated. ?-Continue as needed antitussives/mucolytic management. ? ?Chronic pain syndrome ?- Reports actively follow as an outpatient for methadone therapy. ?-Using Tylenol, Toradol and oxycodone while inpatient to assist with pain management. ? ? ? ? ? Advance Care Planning:   Code Status: Full Code  ? ?Consults: ID ? ?Family Communication: No family at bedside. ? ?Severity of Illness: ?The appropriate patient status for this patient is INPATIENT. Inpatient status is judged to be reasonable and necessary in order to provide the required intensity of service to ensure the patient's safety. The patient's presenting symptoms, physical exam findings, and initial radiographic and laboratory data in the context of their chronic comorbidities is felt to place them at high risk for further clinical deterioration. Furthermore, it is not anticipated that the patient will be medically stable for discharge from the hospital within 2 midnights of admission.  ? ?* I certify that at the point of admission it is my clinical judgment that the patient will require inpatient hospital care spanning beyond 2 midnights from the point of admission due to high intensity of service, high risk for further deterioration and high frequency of surveillance required.* ? ?Author: ?Barton Dubois, MD ?11/14/2021 8:07 PM ? ?For on call review [email or web address].  ?

## 2021-11-14 NOTE — ED Notes (Signed)
Phlebotomy at bedside to attempt to collect second set of [facility] ?

## 2021-11-14 NOTE — ED Provider Notes (Signed)
?[facility] ?Provider Note ? ?CSN: [id number] ?Arrival date & time: 11/14/21 1015 ? ?Chief Complaint(s) ?abdnormal lab ? ?HPI ?Reginald Clarke is a 31 y.o. male with PMH IV drug use who presents emergency department for evaluation of positive blood cultures.  Patient recently seen in the emergency department and diagnosed with right lower lobe pneumonia and discharged on doxycycline.  He states that his symptoms have not really improved, but he received a call today from the pharmacist stating that his blood cultures were positive and needed to return to the emergency department for evaluation.  He currently endorses some mild chest pain but denies abdominal pain, nausea, vomiting or other systemic symptoms. ? ?HPI ? ?Past Medical History ?History reviewed. No pertinent past medical history. ?There are no problems to display for this patient. ? ?Home Medication(s) ?Prior to Admission medications   ?Medication Sig Start Date End Date Taking? Authorizing Provider  ?cetirizine-pseudoephedrine (ZYRTEC-D) 5-120 MG per tablet Take 1 tablet by mouth daily. ?Patient not taking: No sig reported 01/07/14   Lily Kocher, PA-C  ?chlorhexidine (HIBICLENS) 4 % external liquid Apply topically daily as needed. Mix a capful of hibiclens with water and submerge hand for 15-20, 3 times daily 12/08/20   Mordecai Rasmussen, MD  ?clindamycin (CLEOCIN) 150 MG capsule Take 2 capsules (300 mg total) by mouth 4 (four) times daily. 12/05/20   Triplett, Tammy, PA-C  ?dexamethasone (DECADRON) 4 MG tablet 1 po bid with food ?Patient not taking: No sig reported 01/07/14   Lily Kocher, PA-C  ?doxycycline (VIBRAMYCIN) 100 MG capsule Take 1 capsule (100 mg total) by mouth 2 (two) times daily. One po bid x 7 days 11/11/21   Mesner, Corene Cornea, MD  ?potassium chloride SA (KLOR-CON M) 20 MEQ tablet Take 2 tablets (40 mEq total) by mouth 2 (two) times daily for 7 days. 11/11/21 11/18/21  Mesner, Corene Cornea, MD  ?promethazine-codeine (PHENERGAN WITH CODEINE)  6.25-10 MG/5ML syrup Take 5 mLs by mouth every 6 (six) hours as needed for cough. ?Patient not taking: No sig reported 01/07/14   Lily Kocher, PA-C  ?                                                                                                                                  ?Past Surgical History ?Past Surgical History:  ?Procedure Laterality Date  ? tubes in ears    ? WRIST FRACTURE SURGERY    ? ?Family History ?History reviewed. No pertinent family history. ? ?Social History ?Social History  ? ?Tobacco Use  ? Smoking status: Every Day  ?  Types: Cigarettes  ? Smokeless tobacco: Never  ?Substance Use Topics  ? Alcohol use: No  ? Drug use: No  ? ?Allergies ?Amoxil [amoxicillin trihydrate] and Penicillins ? ?Review of Systems ?Review of Systems  ?Respiratory:  Positive for cough.   ?Cardiovascular:  Positive for chest pain.  ? ?Physical Exam ?Vital Signs  ?I have reviewed  the triage vital signs ?BP 100/62 (BP Location: Right Arm)   Pulse (!) 103   Temp 98.1 ?F (36.7 ?C) (Oral)   Resp 18   Ht 5\' 10"  (1.778 m)   Wt 70.8 kg   SpO2 98%   BMI 22.38 kg/m?  ? ?Physical Exam ?Vitals and nursing note reviewed.  ?Constitutional:   ?   General: He is not in acute distress. ?   Appearance: He is well-developed.  ?HENT:  ?   Head: Normocephalic and atraumatic.  ?Eyes:  ?   Conjunctiva/sclera: Conjunctivae normal.  ?Cardiovascular:  ?   Rate and Rhythm: Normal rate and regular rhythm.  ?   Heart sounds: No murmur heard. ?Pulmonary:  ?   Effort: Pulmonary effort is normal. No respiratory distress.  ?   Breath sounds: Rhonchi (RLL) present. No rales.  ?Abdominal:  ?   Palpations: Abdomen is soft.  ?   Tenderness: There is no abdominal tenderness.  ?Musculoskeletal:     ?   General: No swelling.  ?   Cervical back: Neck supple.  ?Skin: ?   General: Skin is warm and dry.  ?   Capillary Refill: Capillary refill takes less than 2 seconds.  ?Neurological:  ?   Mental Status: He is alert.  ?Psychiatric:     ?   Mood and  Affect: Mood normal.  ? ? ?ED Results and Treatments ?Labs ?(all labs ordered are listed, but only abnormal results are displayed) ?Labs Reviewed  ?CBC WITH DIFFERENTIAL/PLATELET  ?BASIC METABOLIC PANEL  ?                                                                                                                       ? ?Radiology ?No results found. ? ?Pertinent labs & imaging results that were available during my care of the patient were reviewed by me and considered in my medical decision making (see MDM for details). ? ?Medications Ordered in ED ?Medications  ?vancomycin (VANCOREADY) IVPB 1500 mg/300 mL (has no administration in time range)  ?ceFAZolin (ANCEF) injection 2 g (has no administration in time range)  ?                                                               ?                                                                    ?Procedures ?Procedures ? ?(including critical care time) ? ?Medical Decision Making / ED Course ? ? ?This patient presents  to the ED for concern of positive blood cultures, this involves an extensive number of treatment options, and is a complaint that carries with it a high risk of complications and morbidity.  The differential diagnosis includes bacteremia, sepsis, pneumonia, failure of outpatient antibiotic therapy ? ?MDM: ?Patient seen in the emergency room for evaluation of positive blood cultures.  Physical exam with some faint rhonchi in the right lower lobe but is otherwise unremarkable.  Patient with a mild sinus tachycardia to 103.  I spoke with pharmacy who is recommending MSSA coverage with cefazolin 2 g every 8 hours based on sensitivities.  Chest x-ray with persistent pneumonia.  Given his history of IV drug use, he may need an additional work-up for endocarditis.  Patient started on cefazolin and admitted to the hospital for repeat cultures and bacteremia. ? ? ?Additional history obtained: ?-Additional history obtained from mother ?-External records from  outside source obtained and reviewed including: Chart review including previous notes, labs, imaging, consultation notes ? ? ?Lab Tests: ?-I ordered, reviewed, and interpreted labs.   ?The pertinent results include:   ?Labs Reviewed  ?CBC WITH DIFFERENTIAL/PLATELET  ?BASIC METABOLIC PANEL  ?  ? ? ?Imaging Studies ordered: ?I ordered imaging studies including CXR ?I independently visualized and interpreted imaging. ?I agree with the radiologist interpretation ? ? ?Medicines ordered and prescription drug management: ?Meds ordered this encounter  ?Medications  ? vancomycin (VANCOREADY) IVPB 1500 mg/300 mL  ?  Order Specific Question:   Indication:  ?  Answer:   Bacteremia  ? ceFAZolin (ANCEF) injection 2 g  ?  ?-I have reviewed the patients home medicines and have made adjustments as needed ? ?Critical interventions ?none ? ? ?Cardiac Monitoring: ?The patient was maintained on a cardiac monitor.  I personally viewed and interpreted the cardiac monitored which showed an underlying rhythm of: Sinus tachycardia ? ?Social Determinants of Health:  ?Factors impacting patients care include: IV drug use last used 2 weeks ago ? ? ?Reevaluation: ?After the interventions noted above, I reevaluated the patient and found that they have :stayed the same ? ?Co morbidities that complicate the patient evaluation ?History reviewed. No pertinent past medical history.  ? ? ?Dispostion: ?I considered admission for this patient, then due to current bacteremia and pneumonia he will require admission. ? ? ? ? ?Final Clinical Impression(s) / ED Diagnoses ?Final diagnoses:  ?None  ? ? ? ?@PCDICTATION @ ? ?  ?Teressa Lower, MD ?11/14/21 1155 ? ?

## 2021-11-14 NOTE — ED Notes (Signed)
Dr. Matilde Sprang informed that patient is difficult stick. Phlebotomy was unsuccessful. Meagan, RN at bedside attempted [place] for 2nd set of [employer]. 2nd set desired by EDP prior to abx administration ? ?

## 2021-11-14 NOTE — Assessment & Plan Note (Deleted)
-  Prior history of IV drug abuse with recent relapse. ?-Recent ED visit (approximately 3-4 days ago due to URI symptoms and diagnosis of community-acquired pneumonia. ?-Patient with low curb 65 treated as an outpatient with doxycycline. ?-Presented back with positive blood cultures (taken at that time and positive for MSSA bacteremia). ?-Infectious disease has been involved, recommending treatment with Ancef, repeat blood cultures and to assure no other source of infection. ?-Good oxygen saturation appreciated on room air. ?-Complaining of significant mid thoracic and lower back pain; MRI 8 was performed to rule out presence of any focal infection, findings suggestive of degenerative disease but no septic arthritis, osteomyelitis or abscesses. ?-Pulmonary changes seen with suggestion of septic emboli to (most likely viral of his back discomfort). 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 ?-Following ID recommendations cardiothoracic surgery has been contacted; recommendations provided to transfer patient to [facility] for TEE and further evaluation/cardiothoracic surgical management. Discussed with Dr. Maren Beach. ?-Continue current IV antibiotics. ?-Patient is hemodynamically stable. ?

## 2021-11-14 NOTE — Assessment & Plan Note (Addendum)
Continue PPI ?

## 2021-11-14 NOTE — Assessment & Plan Note (Addendum)
-  chronic back pain, T/L spine without acute infection in spine ?-concern for layering R effusion on MRI, [place] without effusion ?-Reports actively follow as an outpatient for methadone therapy. ?-Continue home methadone dose. ?-Previous rounding MD was in contact with [facility] in [place]. They know he'll likely discharge 5/10.  He should arrive at 6 am Thursday for his prescription. ?

## 2021-11-14 NOTE — ED Triage Notes (Signed)
Patient just discharged and called to come back due to positive blood culture.  ?

## 2021-11-14 NOTE — Assessment & Plan Note (Deleted)
-  Partially treated with the use doxycycline as an outpatient ?-Currently receiving Ancef which will also provide appropriate coverage for pneumonia. ?-Continue as needed bronchodilators and supportive care. ?-Good saturation on room air appreciated. ?-Continue as needed antitussives/mucolytic management. ?-Patient is afebrile. ?

## 2021-11-14 NOTE — Progress Notes (Signed)
? ? ? ? ?  INFECTIOUS DISEASE NOTE: ? ? ?Date: 11/14/2021 ? ?Patient name: Reginald Clarke  ?Medical record number: [id number]  ?Date of birth: Apr 13, 1991  ? ?31 year old IV drug user admitted with MSSA bacteremia. He was being treated for PNA with doxycyline ? ?He is currently on ancef ? ? ?    ?[facility] Staphylococcus aureus bacteremia ?  ?Staphylococcus aureus bacteremia (SAB) is associated with a high rate of complications and mortality.  Specific aspects of clinical management are critical to optimizing the outcome of patients with SAB.  Therefore, the [facility] Antimicrobial Management Team [name]) has initiated an intervention aimed at improving the management of SAB at [facility].  To do so, Infectious Diseases physicians are providing an evidence-based consult for the management of all patients with SAB.  ?  ? Yes No Comments  ?Perform follow-up blood cultures (even if the patient is afebrile) to ensure clearance of bacteremia [x]  []  Repeat blood cultures x 2 sits  ?Remove vascular catheter and obtain follow-up blood cultures after the removal of the catheter []  []  DO NOT PLACE CENTRAL LINE  ?Perform echocardiography to evaluate for endocarditis (transthoracic ECHO is 40-50% sensitive, TEE is > 90% sensitive) [x]  []  Please keep in mind, that neither test can definitively EXCLUDE endocarditis, and that should clinical suspicion remain high for endocarditis the patient should then still be treated with an "endocarditis" duration of therapy = 6 weeks  ?Consult electrophysiologist to evaluate implanted cardiac device (pacemaker, ICD) []  []  NA  ?Ensure source control [x]  []  Have all abscesses been drained effectively? ?Have deep seeded infections (septic joints or osteomyelitis) had appropriate surgical debridement? ? ?Assess for any soft tissue infection, injury. May want to CT chest to evaluate pleural effusion  ?Investigate for ?metastatic? sites of infection [x]  []  Does the  patient have ANY symptom or physical exam finding that would suggest a deeper infection (back or neck pain that may be suggestive of vertebral osteomyelitis or epidural abscess, muscle pain that could be a symptom of pyomyositis)? ? ?Keep in mind that for deep seeded infections MRI imaging with contrast is preferred rather than other often insensitive tests such as plain x-rays, especially early in a patient's presentation.  ?Change antibiotic therapy to cefazolin [x]  []  Beta-lactam antibiotics are preferred for MSSA due to higher cure rates.   ?If on Vancomycin, goal trough should be 15 - 20 mcg/mL  ?Estimated duration of IV antibiotic therapy:  hopefully he will stay long enough for to clear his bactermia on IV abx and we can see what he is then willing to do TEE will be important []  []  Consult case management for probably prolonged outpatient IV antibiotic therapy  ? ?I am available to discuss case today with team Dr. is covering tomorrow and Dr. on Friday. ? ? ? ? Dam ?11/14/2021, 1:06 PM ? ? ?

## 2021-11-14 NOTE — Telephone Encounter (Signed)
Was able to contact patient. Explained blood culture results. The patient agreed to return to the ED as soon as possible. ?

## 2021-11-15 ENCOUNTER — Inpatient Hospital Stay (HOSPITAL_COMMUNITY): Payer: BLUE CROSS/BLUE SHIELD

## 2021-11-15 DIAGNOSIS — K219 Gastro-esophageal reflux disease without esophagitis: Secondary | ICD-10-CM | POA: Diagnosis not present

## 2021-11-15 DIAGNOSIS — J189 Pneumonia, unspecified organism: Secondary | ICD-10-CM | POA: Diagnosis not present

## 2021-11-15 DIAGNOSIS — R7881 Bacteremia: Secondary | ICD-10-CM

## 2021-11-15 DIAGNOSIS — Z87891 Personal history of nicotine dependence: Secondary | ICD-10-CM | POA: Diagnosis present

## 2021-11-15 DIAGNOSIS — I079 Rheumatic tricuspid valve disease, unspecified: Secondary | ICD-10-CM | POA: Diagnosis present

## 2021-11-15 DIAGNOSIS — G894 Chronic pain syndrome: Secondary | ICD-10-CM | POA: Diagnosis not present

## 2021-11-15 DIAGNOSIS — Z720 Tobacco use: Secondary | ICD-10-CM | POA: Diagnosis present

## 2021-11-15 DIAGNOSIS — Z952 Presence of prosthetic heart valve: Secondary | ICD-10-CM | POA: Diagnosis present

## 2021-11-15 LAB — ECHOCARDIOGRAM COMPLETE
AR max vel: 2.33 cm2
AV Area VTI: 2.15 cm2
AV Area mean vel: 2.25 cm2
AV Mean grad: 5 mmHg
AV Peak grad: 8.6 mmHg
Ao pk vel: 1.47 m/s
Area-P 1/2: 6.02 cm2
Calc EF: 55.6 %
Height: 70 in
MV VTI: 2.9 cm2
S' Lateral: 3.3 cm
Single Plane A2C EF: 57.2 %
Single Plane A4C EF: 55 %
Weight: 2476.21 oz

## 2021-11-15 LAB — HCV RT-PCR, QUANT (NON-GRAPH)
HCV log10: 5.901 log10 IU/mL
Hepatitis C Quantitation: 796000 IU/mL

## 2021-11-15 LAB — CBC
HCT: 32.7 % — ABNORMAL LOW (ref 39.0–52.0)
Hemoglobin: 10.5 g/dL — ABNORMAL LOW (ref 13.0–17.0)
MCH: 29.4 pg (ref 26.0–34.0)
MCHC: 32.1 g/dL (ref 30.0–36.0)
MCV: 91.6 fL (ref 80.0–100.0)
Platelets: 289 10*3/uL (ref 150–400)
RBC: 3.57 MIL/uL — ABNORMAL LOW (ref 4.22–5.81)
RDW: 13.8 % (ref 11.5–15.5)
WBC: 12.2 10*3/uL — ABNORMAL HIGH (ref 4.0–10.5)
nRBC: 0 % (ref 0.0–0.2)

## 2021-11-15 LAB — BASIC METABOLIC PANEL
Anion gap: 7 (ref 5–15)
BUN: 8 mg/dL (ref 6–20)
CO2: 28 mmol/L (ref 22–32)
Calcium: 8.3 mg/dL — ABNORMAL LOW (ref 8.9–10.3)
Chloride: 100 mmol/L (ref 98–111)
Creatinine, Ser: 0.68 mg/dL (ref 0.61–1.24)
GFR, Estimated: >60 mL/min (ref >60)
Glucose, Bld: 103 mg/dL — ABNORMAL HIGH (ref 70–99)
Potassium: 4.2 mmol/L (ref 3.5–5.1)
Sodium: 135 mmol/L (ref 135–145)

## 2021-11-15 LAB — HCV AB W REFLEX TO QUANT PCR: HCV Ab: REACTIVE — AB

## 2021-11-15 LAB — MRSA NEXT GEN BY PCR, NASAL: MRSA by PCR Next Gen: NOT DETECTED

## 2021-11-15 LAB — HEPATITIS B SURFACE ANTIBODY, QUANTITATIVE: Hep B S AB Quant (Post): 840.6 m[IU]/mL (ref >9.9)

## 2021-11-15 IMAGING — MR MR LUMBAR SPINE WO/W CM
7 of 9 series · 38 of 48 positions shown · IV contrast (gadavist)
Comparison: None available.

CLINICAL DATA: Initial evaluation for chronic low back pain.

EXAM:
MRI LUMBAR SPINE WITHOUT AND WITH CONTRAST
TECHNIQUE: Multiplanar and multiecho pulse sequences of the lumbar spine were
obtained without and with intravenous contrast.
CONTRAST:  7mL GADAVIST GADOBUTROL 1 MMOL/ML IV SOLN

[Series 8: T2 · sagittal · 4.0mm · 0.81mm/px · 3 of 16 slices shown (1 of 3)]
[im 1/16]
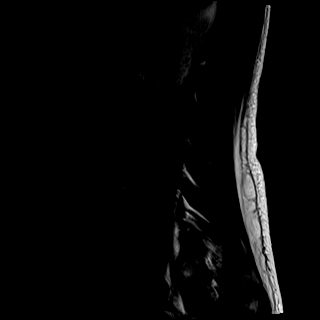
[im 8/16]
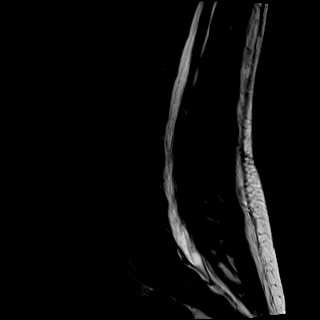
[im 16/16]
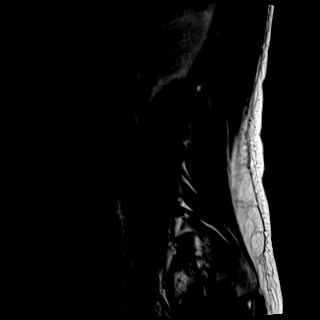

[Series 9: STIR · sagittal · 4.0mm · 0.51mm/px · 1 of 16 slices shown]
[im 1/16]
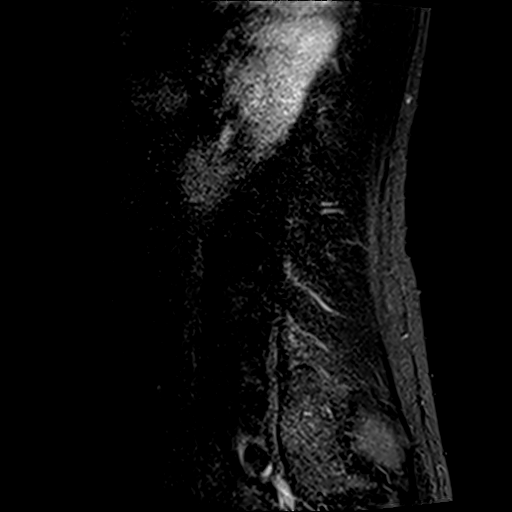

[Series 10: T1 · sagittal · 4.0mm · 1.02mm/px · 4 of 16 slices shown (1 of 2)]
[im 1/16]
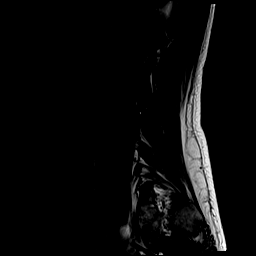
[im 6/16]
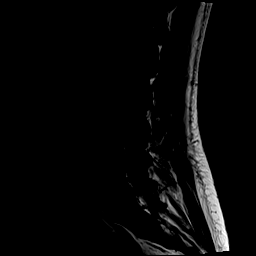
[im 11/16]
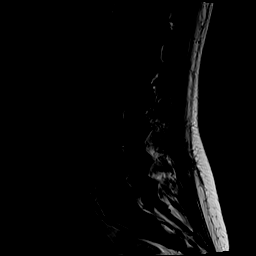
[im 16/16]
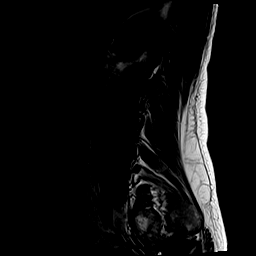

[Series 11: T2 · axial · 4.0mm · 0.70mm/px · z∈[-544,-344]mm · 9 of 35 slices shown (2 of 3)]
[im 1/35]
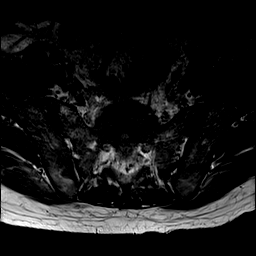
[im 5/35]
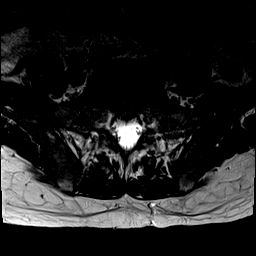
[im 9/35]
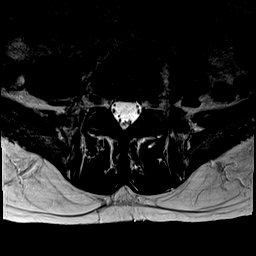
[im 13/35]
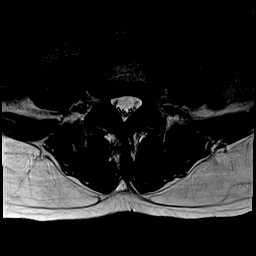
[im 18/35]
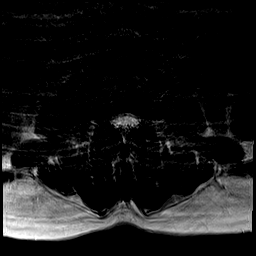
[im 22/35]
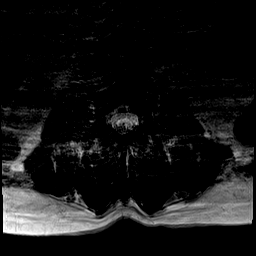
[im 26/35]
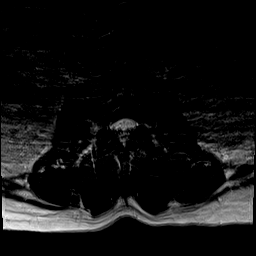
[im 30/35]
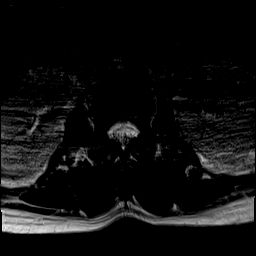
[im 35/35]
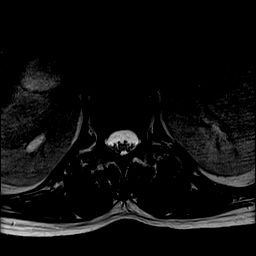

[Series 12: T1 · axial · 4.0mm · 0.35mm/px · z∈[-544,-344]mm · 8 of 35 slices shown (2 of 2)]
[im 1/35]
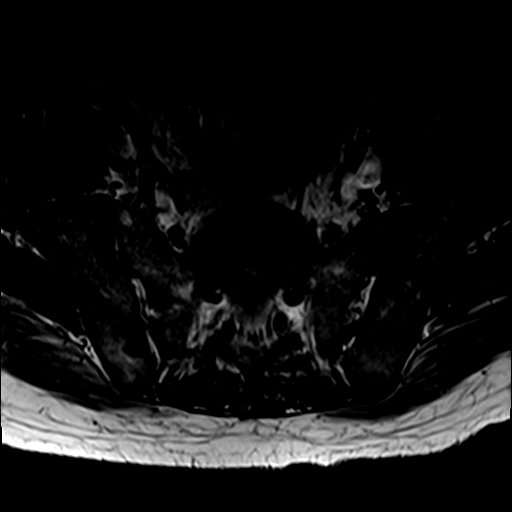
[im 5/35]
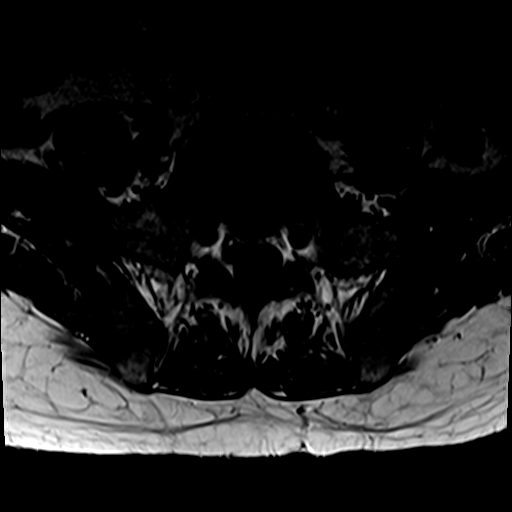
[im 9/35]
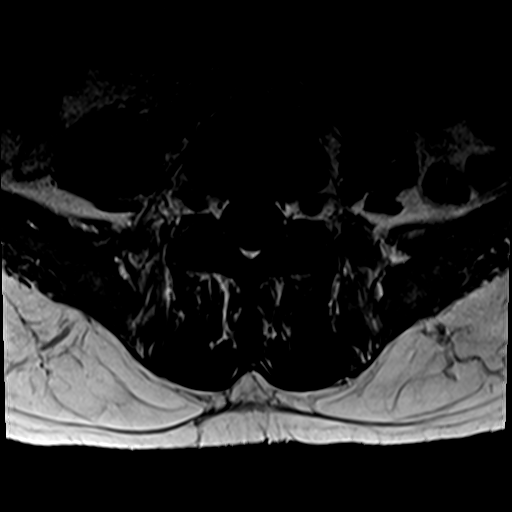
[im 13/35]
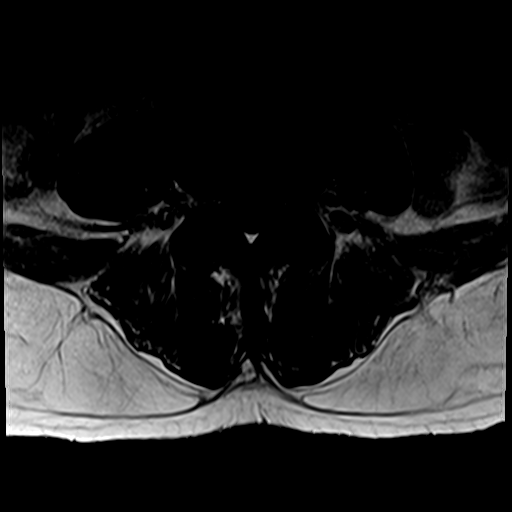
[im 22/35]
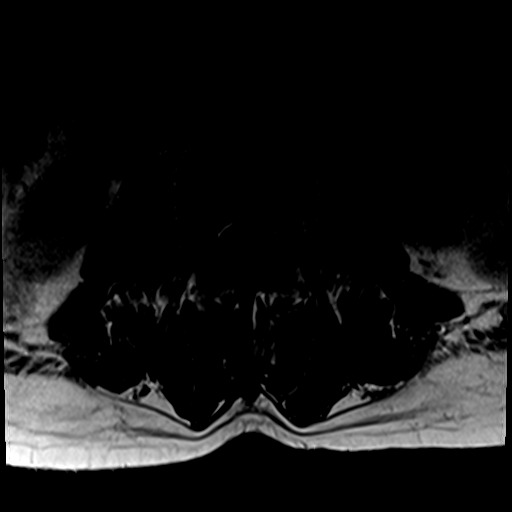
[im 26/35]
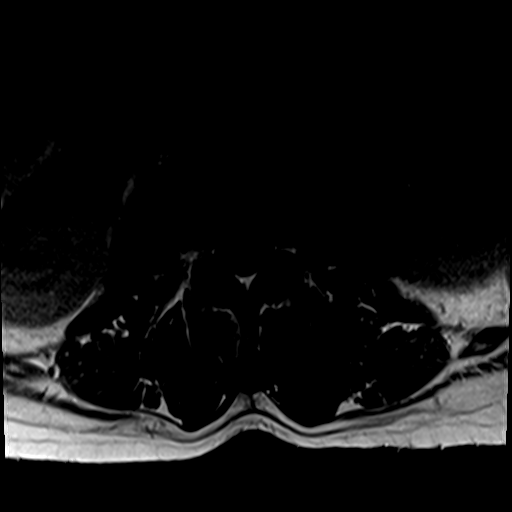
[im 30/35]
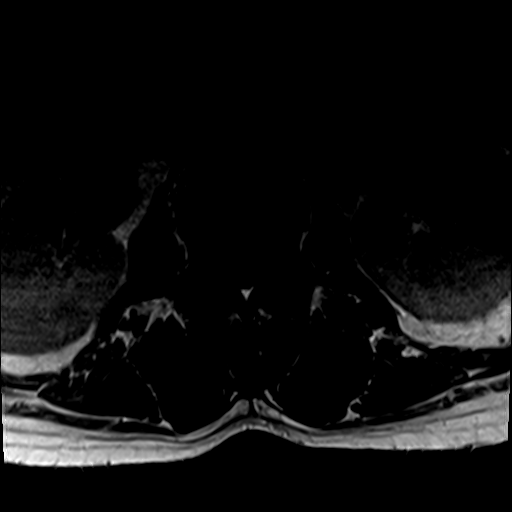
[im 35/35]
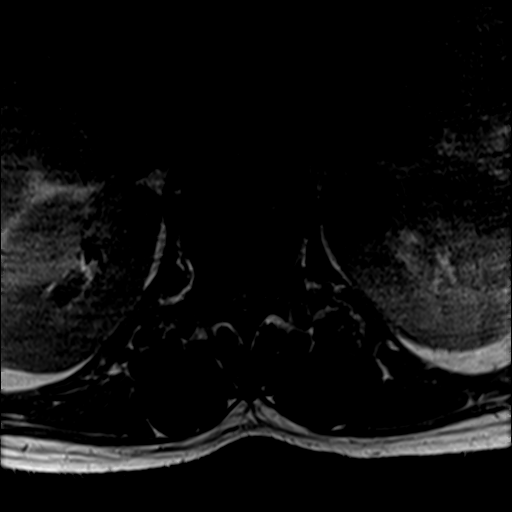

[Series 13: T2 · axial · 4.0mm · 0.70mm/px · z∈[-544,-344]mm · 9 of 35 slices shown (3 of 3)]
[im 1/35]
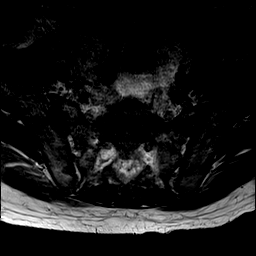
[im 5/35]
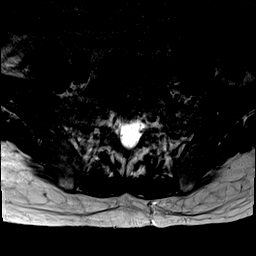
[im 9/35]
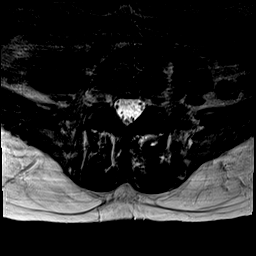
[im 13/35]
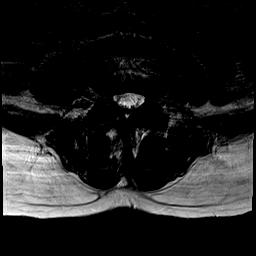
[im 18/35]
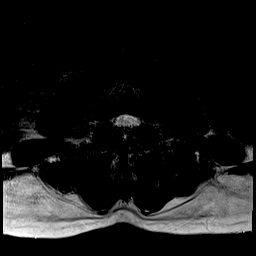
[im 22/35]
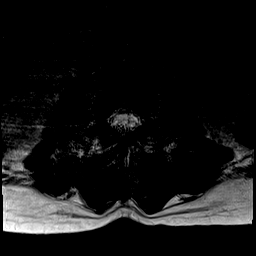
[im 26/35]
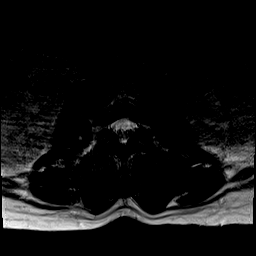
[im 30/35]
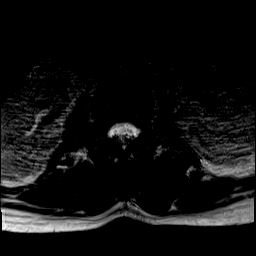
[im 35/35]
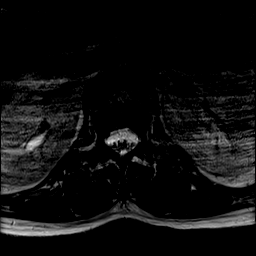

[Series 14: T1 fat-sat post-contrast · sagittal · 4.0mm · 1.02mm/px · 4 of 16 slices shown]
[im 1/16]
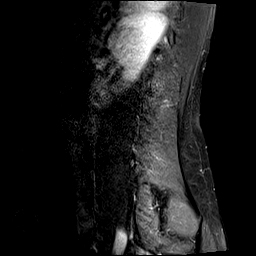
[im 6/16]
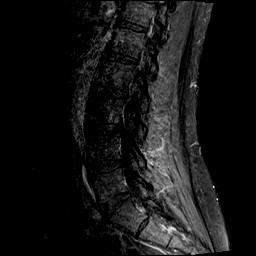
[im 11/16]
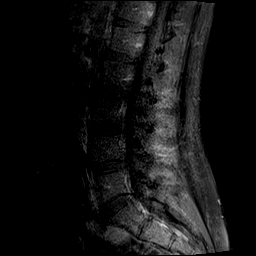
[im 16/16]
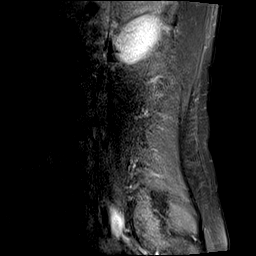

[38 of 48 positions shown; findings below may reference images not displayed]

FINDINGS: Segmentation:  Examination degraded by motion artifact.

Standard segmentation. Lowest well-formed disc space labeled the
L5-S1 level.

Alignment: Physiologic with preservation of the normal lumbar
lordosis. No listhesis.

Vertebrae: Vertebral body height maintained without acute or chronic
fracture. Bone marrow signal intensity diffusely decreased on T1
weighted sequence, nonspecific, but most commonly related to anemia,
smoking or obesity. No focal marrow replacing lesion. No evidence
for osteomyelitis discitis. Minimal reactive edema present about the
L4-5 and L5-S1 facets, favored to be related to degenerative facet
arthritis. No other convincing evidence for septic arthritis at this
time.

Conus medullaris and cauda equina: Conus extends to the L1-2 level.
Conus and cauda equina appear normal. No epidural abscess or
collection.

Paraspinal and other soft tissues: Unremarkable. No paraspinous
phlegmonous change or collection. Visualized visceral structures
grossly unremarkable.

Disc levels:

No significant disc pathology seen within the lumbar spine.
Intervertebral discs are well hydrated with preserved disc height.
Lower lumbar facet hypertrophy L4-5 and L5-S1. No significant canal
or foraminal stenosis.
IMPRESSION: 1. Mild reactive edema about the L4-5 and L5-S1 facets, favored to
be related to degenerative in nature due to facet arthritis. No
convincing evidence for septic arthritis or other infection within
the lumbar spine at this time. If patient's symptoms should worsen
or the clinical scenario should warrant, a short interval follow-up
study to evaluate for any potential interval change would be
suggested.
2. Otherwise unremarkable MRI of the lumbar spine. No significant
disc pathology or stenosis. No neural impingement.

## 2021-11-15 IMAGING — MR MR THORACIC SPINE WO/W CM
5 of 9 series · 23 of 48 positions shown · IV contrast (7 ml Gadavist)
Comparison: None.

CLINICAL DATA: Initial evaluation for chronic mid back pain.

EXAM:
MRI THORACIC WITHOUT AND WITH CONTRAST
TECHNIQUE: Multiplanar and multiecho pulse sequences of the thoracic spine were
obtained without and with intravenous contrast.
CONTRAST:  7mL GADAVIST GADOBUTROL 1 MMOL/ML IV SOLN

[Series 18: T1 · sagittal · 3.3mm · 0.62mm/px · 3 of 12 slices shown (1 of 3)]
[im 1/12]
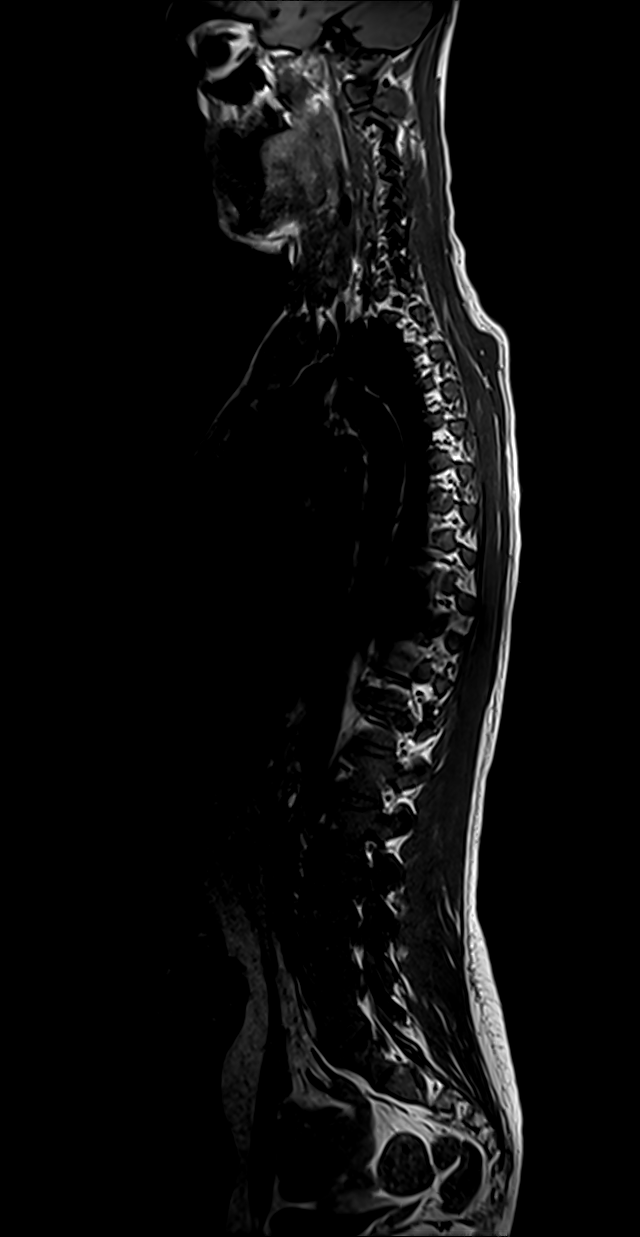
[im 6/12]
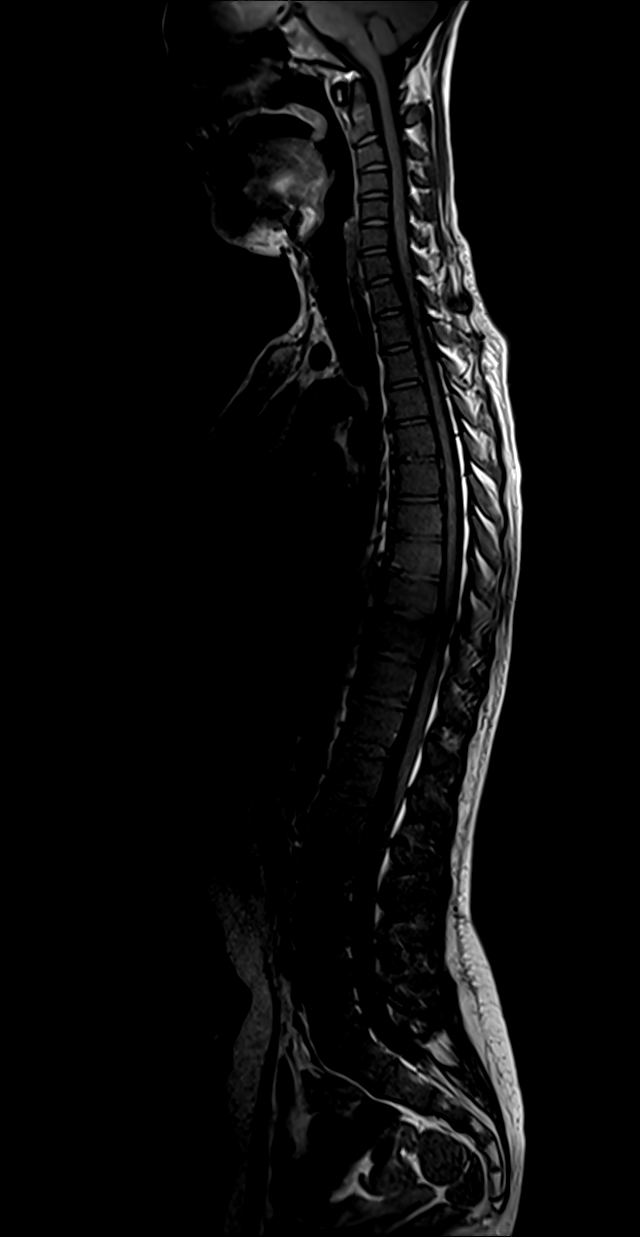
[im 12/12]
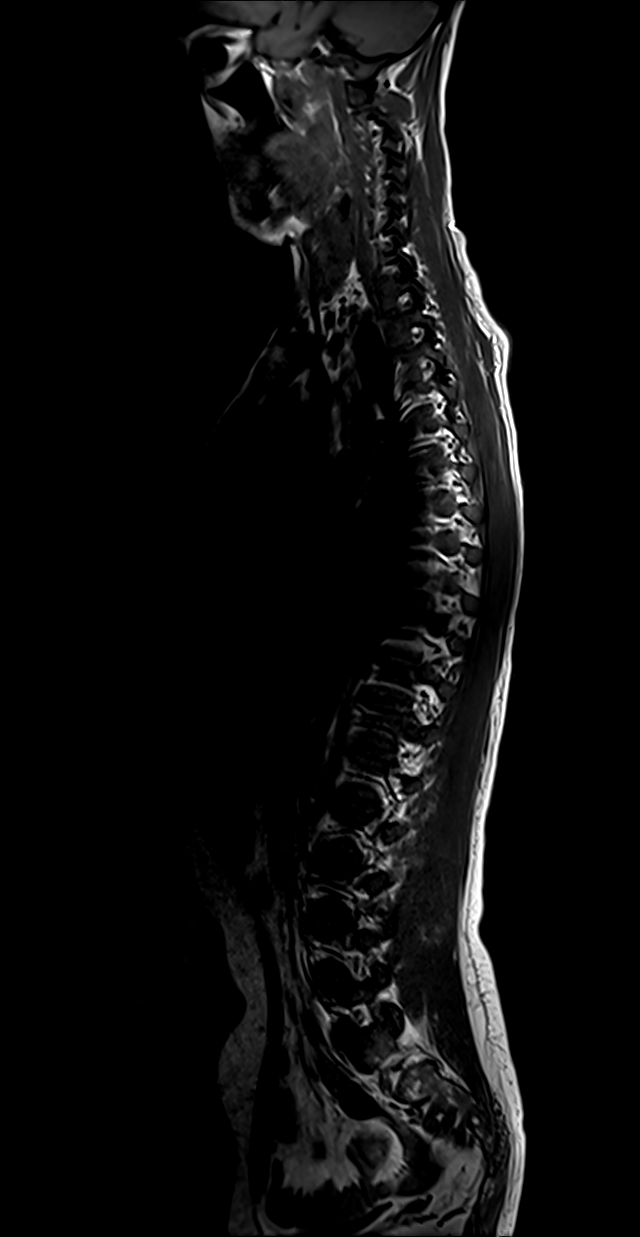

[Series 19: T2 · sagittal · 3.0mm · 1.06mm/px · 4 of 17 slices shown (1 of 2)]
[im 1/17]
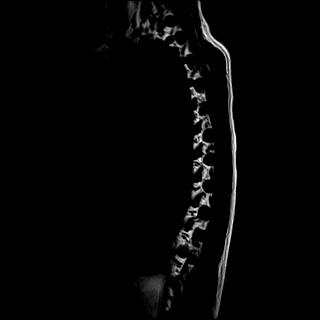
[im 6/17]
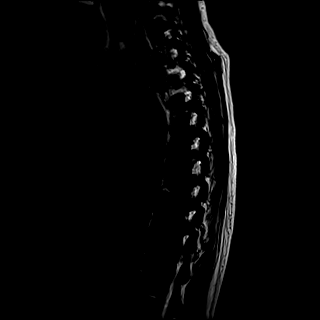
[im 11/17]
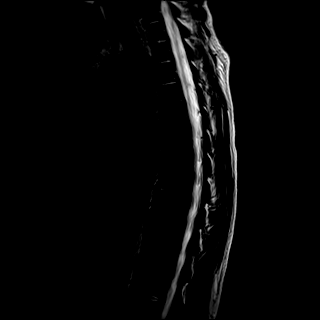
[im 17/17]
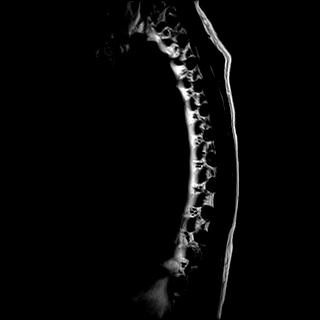

[Series 20: T1 · sagittal · 3.0mm · 1.33mm/px · 3 of 17 slices shown (2 of 3)]
[im 1/17]
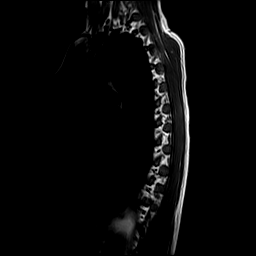
[im 9/17]
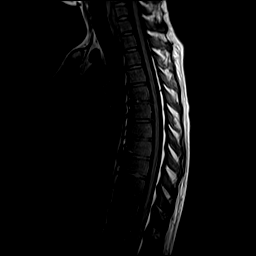
[im 17/17]
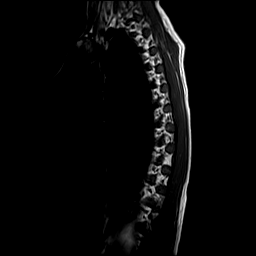

[Series 22: T2 · axial · 5.0mm · 0.74mm/px · z∈[-314,-41]mm · 8 of 39 slices shown (2 of 2)]
[im 1/39]
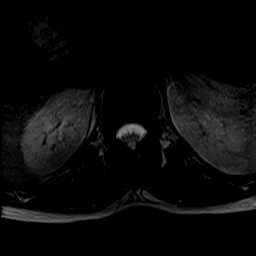
[im 6/39]
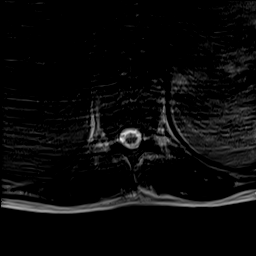
[im 11/39]
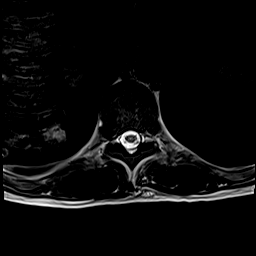
[im 17/39]
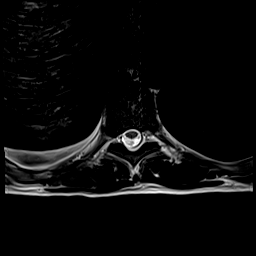
[im 22/39]
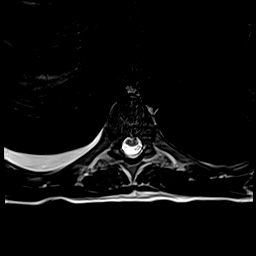
[im 28/39]
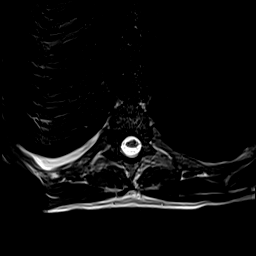
[im 33/39]
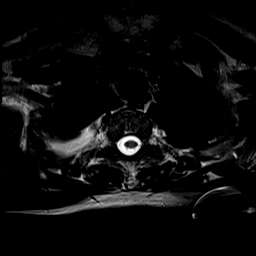
[im 39/39]
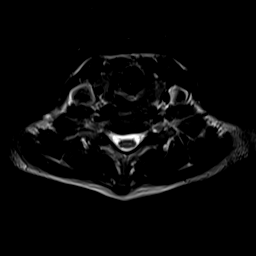

[Series 24: T1 · axial · non-contrast · 5.0mm · 0.37mm/px · z∈[-314,-158]mm · 5 of 39 slices shown (3 of 3)]
[im 1/39]
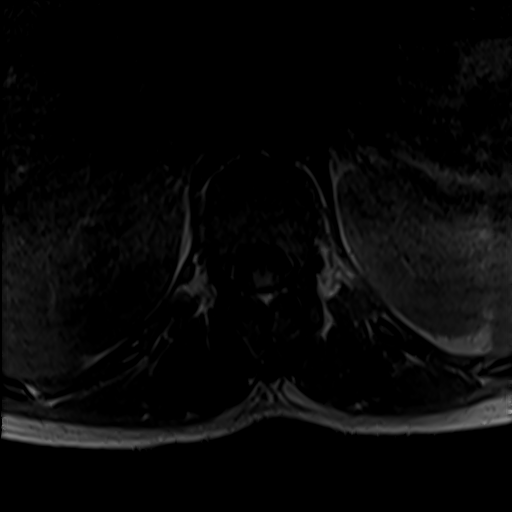
[im 6/39]
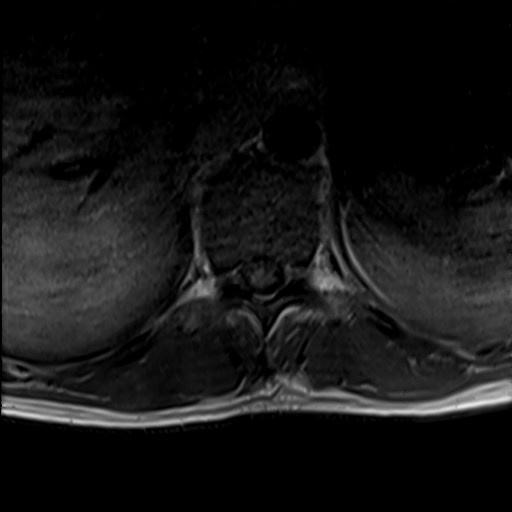
[im 11/39]
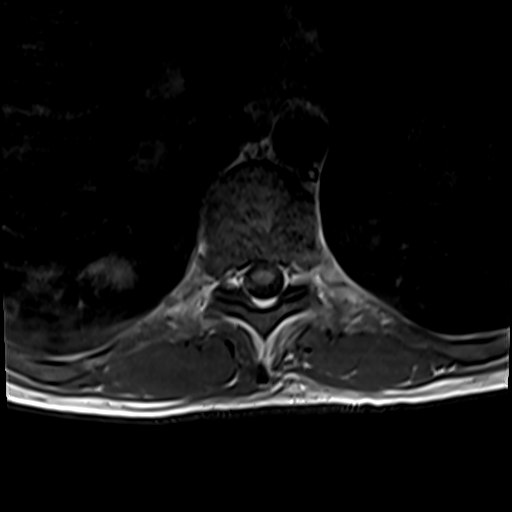
[im 17/39]
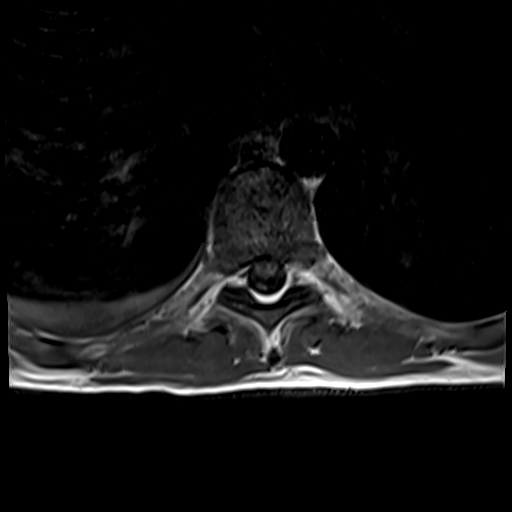
[im 22/39]
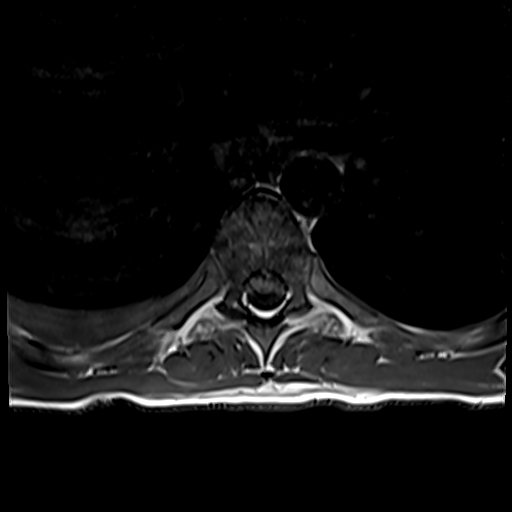

[23 of 48 positions shown; findings below may reference images not displayed]

FINDINGS: Alignment: Physiologic with preservation of the normal thoracic
kyphosis. No listhesis.

Vertebrae: Vertebral body height maintained without acute or chronic
fracture. Bone marrow signal intensity diffusely decreased on T1
weighted sequence, nonspecific, but most commonly related to anemia,
smoking or obesity. No discrete or worrisome osseous lesions. No
evidence for osteomyelitis discitis or septic arthritis.

Cord: Normal signal and morphology. No epidural abscess or other
collection.

Paraspinal and other soft tissues: No paraspinous phlegmon or
collection. Scattered patchy multifocal opacities within the
partially visualized lungs, suspicious for possible septic emboli,
worse on the right. Layering right pleural effusion.

Disc levels:

T4-5: Small central disc protrusion indents the ventral thecal sac.
Minimal flattening of the ventral cord without cord signal changes
or significant stenosis.

T5-6: Small central disc protrusion indents the ventral thecal sac.
Secondary cord flattening without cord signal changes or significant
stenosis.

T6-7: Small central disc protrusion indents the ventral thecal sac.
Minimal cord flattening without cord signal changes or significant
spinal stenosis.

Otherwise, no other significant disc pathology seen elsewhere within
the thoracic spine. Few scattered chronic endplate Schmorl's nodes
noted throughout the mid and lower thoracic spine. No other stenosis
or neural impingement.
IMPRESSION: 1. No evidence for acute infection within the thoracic spine.
2. Scattered patchy multifocal opacities within the partially
visualized lungs, suspicious for septic emboli, worse on the right.
Associated layering right pleural effusion.
3. Small central disc protrusions at T4-5 through T6-7 without
significant stenosis.

## 2021-11-15 MED ORDER — METHADONE HCL 10 MG PO TABS
130.0000 mg | ORAL_TABLET | Freq: Every day | ORAL | Status: DC
  Administered 2021-11-15 – 2021-12-12 (×28): 130 mg via ORAL
  Filled 2021-11-15 (×28): qty 13

## 2021-11-15 MED ORDER — ALUM & MAG HYDROXIDE-SIMETH 200-200-20 MG/5ML PO SUSP
30.0000 mL | ORAL | Status: DC | PRN
  Administered 2021-11-15 – 2021-11-26 (×5): 30 mL via ORAL
  Filled 2021-11-15 (×5): qty 30

## 2021-11-15 MED ORDER — METHADONE HCL 5 MG/5ML PO SOLN: 135.0000 mg | Freq: Every day | ORAL | Status: DC

## 2021-11-15 MED ORDER — METHADONE HCL 5 MG PO TABS: 135.0000 mg | ORAL_TABLET | Freq: Every day | ORAL | Status: DC

## 2021-11-15 MED ORDER — METHADONE HCL 5 MG PO TABS
5.0000 mg | ORAL_TABLET | Freq: Every day | ORAL | Status: DC
  Administered 2021-11-15 – 2021-12-12 (×28): 5 mg via ORAL
  Filled 2021-11-15 (×28): qty 1

## 2021-11-15 MED ORDER — GADOBUTROL 1 MMOL/ML IV SOLN
7.0000 mL | Freq: Once | INTRAVENOUS | Status: AC | PRN
  Administered 2021-11-15: 7 mL via INTRAVENOUS

## 2021-11-15 NOTE — Progress Notes (Signed)
?  Subjective: ?After  discussing the patient with Dr. Gwenlyn Perking  I have reviewed the images of today's transthoracic echo and reviewed the medical record for coordination of care. ?It appears from the echocardiogram the 1.5 cm vegetations on the tricuspid valve leaflet could be debrided with the trans venous AngioVAC procedure. ?Would recommend transfer to the [facility] service at [facility] to arrange a TEE and then angiovac  treatment by CT surgery. ?Objective: ?Vital signs in last 24 hours: ?Temp:  [98 ?F (36.7 ?C)-98.9 ?F (37.2 ?C)] 98.9 ?F (37.2 ?C) (04/13 1300) ?Pulse Rate:  [87-119] 119 (04/13 1300) ?Resp:  [17-20] 17 (04/13 1300) ?BP: (105-137)/(61-85) 108/63 (04/13 1300) ?SpO2:  [96 %-99 %] 97 % (04/13 1300) ?Weight:  [70.2 kg] 70.2 kg (04/12 2000) ? ?Hemodynamic parameters for last 24 hours: ?  ? ?Intake/Output from previous day: ?04/12 0701 - 04/13 0700 ?In: 580 [P.O.:480; IV Piggyback:100] ?Out: -  ?Intake/Output this shift: ?No intake/output data recorded. ? ? ? ?Lab Results: ?Recent Labs  ?  11/14/21 ?1235 11/15/21 ?0503  ?WBC 7.7 12.2*  ?HGB 10.9* 10.5*  ?HCT 33.8* 32.7*  ?PLT 176 289  ? ?BMET:  ?Recent Labs  ?  11/14/21 ?1235 11/15/21 ?0503  ?NA 132* 135  ?K 4.0 4.2  ?CL 99 100  ?CO2 27 28  ?GLUCOSE 85 103*  ?BUN 7 8  ?CREATININE 0.64 0.68  ?CALCIUM 8.0* 8.3*  ?  ?PT/INR: No results for input(s): LABPROT, INR in the last 72 hours. ?ABG ?No results found for: PHART, HCO3, TCO2, ACIDBASEDEF, O2SAT ?CBG (last 3)  ?No results for input(s): GLUCAP in the last 72 hours. ? ?Assessment/Plan: ?S/P  ?MSSA bacteremia, IV drug abuse ?Tricuspid valve endocarditis with significant vegetations ?Recommend transfer to [facility] for further evaluation and CT surgical therapy. ? ? ?Lovett Sox ?11/15/2021 ?  ?

## 2021-11-15 NOTE — Assessment & Plan Note (Addendum)
-  Smoking cessation counseling has been provided ?-Patient declined nicotine patch. ?

## 2021-11-15 NOTE — TOC Progression Note (Signed)
?  Transition of Care (TOC) Screening Note ? ? ?Patient Details  ?Name: Reginald Clarke ?Date of Birth: 01/12/1991 ? ? ?Transition of Care (TOC) CM/SW Contact:    ?Leitha Bleak, RN ?Phone Number: ?11/15/2021, 1:16 PM ? ? ? ?Transition of Care Department [facility]) has reviewed patient and no TOC needs have been identified at this time. We will continue to monitor patient advancement through interdisciplinary progression rounds. If new patient transition needs arise, please place a TOC consult. ? ? ? ?  ?Barriers to Discharge: Continued Medical Work up ? ? ?

## 2021-11-15 NOTE — Assessment & Plan Note (Addendum)
Now s/p R femoral vein cannulation, R IJ vein cannulation, R heart cannulation, and debridement of R atrial mass and tricuspid valve vegetation on 4/26 with CT surgery -> good results with debridement, residual subvalvular component of vegetation which was not accessible, likely part of torn chordae. ?4/8 with staph aureus in urine ?MSSA 4/9 cultures ?4/12 cultures no growth ?ID recommended cefazolin until 5/10.  Subsequently patient was given oritavancin.  Plan is for weekly dalbavancin to be arranged by infectious disease.  Discussed with Dr. Elinor Parkinson before discharge. ? ? ? ?

## 2021-11-15 NOTE — Progress Notes (Addendum)
?Progress Note ? ? ?Patient: Reginald Clarke [id number]:[id number] DOB: October 31, 1990 DOA: 11/14/2021     1 ?DOS: the patient was seen and examined on 11/15/2021 ?  ?Brief hospital course: ?Reginald Clarke is a 31 y.o. male with medical history significant of IV drug use, gastroesophageal flux disease, chronic pain (follow-up as an outpatient for methadone clinic) and recent ED visit secondary to community-acquired pneumonia (discharged with intention to be treated with doxycycline as an outpatient); who presented after being called with abnormal blood culture results resembling acute staph virus infection and instructed to return to the emergency department for further evaluation and management.  Patient reported good oxygen saturation on room air and no fever; still complaining of intermittent coughing spells and some pleuritic discomfort.  Also reported chronic ongoing lower back pain. ?Patient expressed no abdominal pain, no nausea, no vomiting, no dysuria, no hematuria, no melena, no hematochezia, no focal weaknesses or any other complaints. ?  ?Repeat chest x-ray demonstrating right basilar pneumonia and a small right pleural effusion.  Urinalysis showing pyuria with negative nitrites; negative COVID PCR and influenza. ?  ?CBC demonstrating WBC 7.7, hemoglobin 10.9 and platelets count 176 K ?Basic metabolic panel with a sodium of 132, potassium 4.0, chloride 99, bicarb 27, BUN 7, creatinine 0.64 ?  ?  ? ?Assessment and Plan: ?* Bacteremia ?-Prior history of IV drug abuse with recent relapse. ?-Recent ED visit (approximately 3-4 days ago due to URI symptoms and diagnosis of community-acquired pneumonia. ?-Patient with low curb 65 treated as an outpatient with doxycycline. ?-Presented back with positive blood cultures (taken at that time and positive for MSSA bacteremia. ?-Infectious disease has been involved recommending treatment with Ancef, repeat blood cultures and to assure no other source of infection. ?-Good oxygen  saturation appreciated ?-Complaining of significant mid thoracic and lower back pain; following ID recommendation we will also check MRI. 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 ?-Following ID recommendations cardiothoracic surgery has been contacted; recommendations provided to transfer patient to [facility] for TEE and further evaluation/cardiothoracic surgical management. ?-Continue current IV antibiotics. ? ?Endocarditis of tricuspid valve ?- Patient will be transferred to [facility] secondary to significant vegetations appreciated on tricuspid valve ?-We will continue current IV antibiotic ?-Patient will need TEE and evaluation for cardiothoracic surgical intervention. ? ?Tobacco abuse ?- Smoking cessation counseling has been provided ?-Patient declined nicotine patch. ? ?GERD (gastroesophageal reflux disease) ?-continue PPI ? ?CAP (community acquired pneumonia) ?-Partially treated with the use doxycycline as an outpatient ?-Will currently be receiving Ancef which will also provide appropriate coverage for pneumonia. ?-Continue as needed bronchodilators and supportive care. ?-Good saturation on room air appreciated. ?-Continue as needed antitussives/mucolytic management. ? ?Chronic pain syndrome ?- Reports actively follow as an outpatient for methadone therapy. ?-Resume home methadone dose ?-Continue Tylenol and as needed Toradol. ? ? ?Subjective:  ?Complaining of back pain, and reporting pleuritic discomfort with intermittent nonproductive coughing spells.  Good oxygen saturation on room air.  Afebrile.  No nausea or vomiting. ? ?Physical Exam: ?Vitals:  ? 11/15/21 0140 11/15/21 0553 11/15/21 0958 11/15/21 1300  ?BP: 105/64 137/85 118/70 108/63  ?Pulse: 87 (!) 110 (!) 117 (!) 119  ?Resp: 19 20 17 17   ?Temp: 98.1 ?F (36.7 ?C) 98.1 ?F (36.7 ?C) 98.3 ?F (36.8 ?C) 98.9 ?F (37.2 ?C)  ?TempSrc: Oral  Oral Oral  ?SpO2: 96%  99% 97%  ?Weight:      ?Height:       ? ?General exam:  Alert, awake, oriented x 3; no requiring oxygen supplementation. ?Respiratory system: Positive rhonchi on auscultation; no wheezing, no using accessory muscles.  Good saturation on room air. ?Cardiovascular system:RRR.  No rubs or gallops; no JVD. ?Gastrointestinal system: Abdomen is nondistended, soft and nontender. No organomegaly or masses felt. Normal bowel sounds heard. ?Central nervous system: Alert and oriented. No focal neurological deficits. ?Extremities: No cyanosis or clubbing; no edema.  Complaining of mid thoracic and lower back pain. ?Skin: No petechiae. ?Psychiatry: Judgement and insight appear normal. Mood & affect appropriate.  ? ?Data Reviewed: ?2D echo: Positive for tricuspid valve endocarditis ?-MRSA PCR negative ?CBC demonstrating WBCs of 2.2; Hemoglobin 10.5; platelets 289 K ?Basic metabolic panel with sodium of 135, potassium 4.2, bicarb 28, BUN 18, creatinine 0.68. ?CRP 15.4 ?ESR 90 ? ?Family Communication: Careful at bedside. ? ?Disposition: ?Status is: Inpatient ?Remains inpatient appropriate because: Requiring IV antibiotics. ? ? Planned Discharge Destination: Home ? ? ?Author: ?Barton Dubois, MD ?11/15/2021 5:03 PM ? ?For on call review [email or web address].  ?

## 2021-11-15 NOTE — Progress Notes (Signed)
?   11/15/21 0958  ?Assess: MEWS Score  ?Temp 98.3 ?F (36.8 ?C)  ?BP 118/70  ?Pulse Rate (!) 117  ?Resp 17  ?SpO2 99 %  ?O2 Device Room Air  ?Assess: MEWS Score  ?MEWS Temp 0  ?MEWS Systolic 0  ?MEWS Pulse 2  ?MEWS RR 0  ?MEWS LOC 0  ?MEWS Score 2  ?MEWS Score Color Yellow  ?Assess: if the MEWS score is Yellow or Red  ?Were vital signs taken at a resting state? Yes  ?Focused Assessment Change from prior assessment (see assessment flowsheet)  ?Early Detection of Sepsis Score *See Row Information* Low  ?MEWS guidelines implemented *See Row Information* Yes  ?Treat  ?Pain Scale 0-10  ?Pain Score 0  ?Take Vital Signs  ?Increase Vital Sign Frequency  Yellow: Q 2hr X 2 then Q 4hr X 2, if remains yellow, continue Q 4hrs  ?Escalate  ?MEWS: Escalate Yellow: discuss with charge nurse/RN and consider discussing with provider and RRT  ?Notify: Charge Nurse/RN  ?Name of Charge Nurse/RN Notified Emiliano Dyer, RN  ?Date Charge Nurse/RN Notified 11/15/21  ?Time Charge Nurse/RN Notified 1023  ?Notify: Provider  ?Provider Name/Title Reino Bellis MD  ?Date Provider Notified 11/15/21  ?Time Provider Notified 1024  ?Notification Type Page  ?Notification Reason Change in status  ?Provider response No new orders  ?Date of Provider Response 11/15/21  ?Time of Provider Response 1024  ? ? ?

## 2021-11-15 NOTE — Progress Notes (Signed)
? ?[facility] Infectious Diseases Follow Up Note ? ?Patient Identification: ?Patient Name: Reginald Clarke MRN: [id number] Admit Date: 11/14/2021 10:24 AM ?Age: 31 y.o.Today's Date: 11/15/2021 ? ? ?Reason for Visit: staph bacteremia  ? ?Principal Problem: ?  Bacteremia ?Active Problems: ?  Chronic pain syndrome ?  CAP (community acquired pneumonia) ?  GERD (gastroesophageal reflux disease) ? ? ?Antibiotics:  ?Cefazolin 4/12-c ? ?Lines/Hardwares: None  ? ?Interval Events: continues to be afebrile. Mild leukocytosis  ? ? ?Assessment ?MSSA bacteremia ?Rt basal PNA with parapneumonic effusion ?Splenomegaly/Possible endocarditis  ?IVDU- Hepatitis B surrface ag and antibody negative ?Chronic low back pain  ? ? ?Recommendations ?Continue cefazolin ?Fu repeat blood cultures and sensi of staph aureus  ?Fu TTE, may need TEE if TTE is negative given high concerns for endocarditis ?Monitor for metastatic sites of infection ?MRI of back if worsening acute on back pain or vertebral tenderness or concerning signs and symptoms ?Fu Hepatitis C serology. Check HIV  ?Dr Thedore Mins will follow from tomorrow  ?D/w Primary  ? ?Rest of the management as per the primary team. ?Thank you for the consult. Please page with pertinent questions or concerns. ? ?______________________________________________________________________ ?Subjective ?Patient not seen in person and chart reviewed only  ? ? ?History reviewed. No pertinent past medical history. ? ?Past Surgical History:  ?Procedure Laterality Date  ? tubes in ears    ? WRIST FRACTURE SURGERY    ? ?Vitals ?BP 118/70 (BP Location: Right Arm)   Pulse (!) 117   Temp 98.3 ?F (36.8 ?C) (Oral)   Resp 17   Ht 5\' 10"  (1.778 m)   Wt 70.2 kg   SpO2 99%   BMI 22.21 kg/m?  ? ? ?Pertinent Microbiology ?Results for orders placed or performed during the hospital encounter of 11/14/21  ?Blood culture (routine x 2)     Status: None (Preliminary result)  ?  Collection Time: 11/14/21 12:35 PM  ? Specimen: BLOOD LEFT FOREARM  ?Result Value Ref Range Status  ? Specimen Description BLOOD LEFT FOREARM  Final  ? Special Requests   Final  ?  BOTTLES DRAWN AEROBIC AND ANAEROBIC Blood Culture adequate volume  ? Culture   Final  ?  NO GROWTH < 12 HOURS ?Performed at [facility], [address], [place], [place] ?  ? Report Status PENDING  Incomplete  ?Blood culture (routine x 2)     Status: None (Preliminary result)  ? Collection Time: 11/14/21 12:57 PM  ? Specimen: BLOOD LEFT ARM  ?Result Value Ref Range Status  ? Specimen Description BLOOD LEFT ARM  Final  ? Special Requests   Final  ?  BOTTLES DRAWN AEROBIC AND ANAEROBIC Blood Culture results may not be optimal due to an excessive volume of blood received in culture bottles  ? Culture   Final  ?  NO GROWTH < 12 HOURS ?Performed at [facility], [address], [place], [place] ?  ? Report Status PENDING  Incomplete  ? ? ?Pertinent Lab. ? ?  Latest Ref Rng & Units 11/15/2021  ?  5:03 AM 11/14/2021  ? 12:35 PM 11/10/2021  ? 11:33 PM  ?CBC  ?WBC 4.0 - 10.5 K/uL 12.2   7.7   13.3    ?Hemoglobin 13.0 - 17.0 g/dL 01/10/2022   35.3   29.9    ?Hematocrit 39.0 - 52.0 % 32.7   33.8   36.9    ?Platelets 150 - 400 K/uL 289   176   66    ? ? ?  Latest Ref Rng & Units 11/15/2021  ?  5:03 AM 11/14/2021  ? 12:35 PM 11/10/2021  ? 11:33 PM  ?CMP  ?Glucose 70 - 99 mg/dL 161103   85   096130    ?BUN 6 - 20 mg/dL 8   7   12     ?Creatinine 0.61 - 1.24 mg/dL 0.450.68   4.090.64   8.110.81    ?Sodium 135 - 145 mmol/L 135   132   130    ?Potassium 3.5 - 5.1 mmol/L 4.2   4.0   2.6    ?Chloride 98 - 111 mmol/L 100   99   89    ?CO2 22 - 32 mmol/L 28   27   30     ?Calcium 8.9 - 10.3 mg/dL 8.3   8.0   8.4    ?Total Protein 6.5 - 8.1 g/dL   6.8    ?Total Bilirubin 0.3 - 1.2 mg/dL   0.6    ?Alkaline Phos 38 - 126 U/L   78    ?AST 15 - 41 U/L   30    ?ALT 0 - 44 U/L   18    ? ? ? ?Pertinent Imaging today ?Plain films and CT images have been personally visualized and  interpreted; radiology reports have been reviewed. Decision making incorporated into the Impression / Recommendations. ? ?DG Chest 2 View ? ?Result Date: 11/14/2021 ?CLINICAL DATA:  PNA monitoring EXAM: CHEST - 2 VIEW COMPARISON:  November 11, 2021. FINDINGS: New right basilar airspace opacities. New small right pleural effusion. Left lung is clear. No visible pneumothorax. Cardiomediastinal silhouette is within normal limits. IMPRESSION: Findings compatible with right basilar pneumonia and small right parapneumonic effusion. Electronically Signed   By: Feliberto HartsFrederick S Jones M.D.   On: 11/14/2021 11:15  ? ?DG Chest 2 View ? ?Result Date: 11/11/2021 ?CLINICAL DATA:  Nausea, vomiting and diarrhea EXAM: CHEST - 2 VIEW COMPARISON:  02/24/2021 FINDINGS: The heart size and mediastinal contours are within normal limits. Both lungs are clear. The visualized skeletal structures are unremarkable. IMPRESSION: No active cardiopulmonary disease. Electronically Signed   By: Deatra RobinsonKevin  Herman M.D.   On: 11/11/2021 02:35  ? ?CT ABDOMEN PELVIS W CONTRAST ? ?Result Date: 11/11/2021 ?CLINICAL DATA:  Left lower quadrant pain EXAM: CT ABDOMEN AND PELVIS WITH CONTRAST TECHNIQUE: Multidetector CT imaging of the abdomen and pelvis was performed using the standard protocol following bolus administration of intravenous contrast. RADIATION DOSE REDUCTION: This exam was performed according to the departmental dose-optimization program which includes automated exposure control, adjustment of the mA and/or kV according to patient size and/or use of iterative reconstruction technique. CONTRAST:  100mL OMNIPAQUE IOHEXOL 300 MG/ML  SOLN COMPARISON:  None. FINDINGS: Lower chest: Scattered foci of irregular airspace disease and ground-glass density slightly nodular in appearance at the bilateral lower lobes. No pleural effusion. Hepatobiliary: No focal liver abnormality is seen. No gallstones, gallbladder wall thickening, or biliary dilatation. Pancreas:  Unremarkable. No pancreatic ductal dilatation or surrounding inflammatory changes. Spleen: Appears enlarged measuring 15 cm. Adrenals/Urinary Tract: Adrenal glands are unremarkable. Kidneys are normal, without renal calculi, focal lesion, or hydronephrosis. Bladder is unremarkable. Stomach/Bowel: Stomach is within normal limits. Appendix appears normal. No evidence of bowel wall thickening, distention, or inflammatory changes. Vascular/Lymphatic: No significant vascular findings are present. No enlarged abdominal or pelvic lymph nodes. Reproductive: Prostate is unremarkable. Other: Negative for pelvic effusion or free air. Musculoskeletal: No acute or significant osseous findings. IMPRESSION: 1. Multifocal small foci of irregular airspace disease  and ground-glass disease within the bilateral lower lobes with slightly nodular appearance, suspected to be secondary to respiratory infection, to include atypical organisms. 2. Splenomegaly Electronically Signed   By: Jasmine Pang M.D.   On: 11/11/2021 01:20   ? ? ?I spent more 35 minutes for this patient encounter including review of prior medical records, coordination of care with primary/other specialist with greater than 50% of time being face to face/counseling and discussing diagnostics/treatment plan with the patient/family. ? ?Electronically signed by:  ? ?Odette Fraction, MD ?Infectious Disease Physician ?[facility] ?Pager: [phone] ? ?

## 2021-11-15 NOTE — Progress Notes (Signed)
*  PRELIMINARY RESULTS* ?Echocardiogram ?2D Echocardiogram has been performed. ? ?Reginald Clarke ?11/15/2021, 9:07 AM ?

## 2021-11-16 DIAGNOSIS — R7881 Bacteremia: Secondary | ICD-10-CM | POA: Diagnosis not present

## 2021-11-16 DIAGNOSIS — G894 Chronic pain syndrome: Secondary | ICD-10-CM | POA: Diagnosis not present

## 2021-11-16 DIAGNOSIS — K219 Gastro-esophageal reflux disease without esophagitis: Secondary | ICD-10-CM | POA: Diagnosis not present

## 2021-11-16 DIAGNOSIS — J189 Pneumonia, unspecified organism: Secondary | ICD-10-CM | POA: Diagnosis not present

## 2021-11-16 LAB — BASIC METABOLIC PANEL
Anion gap: 7 (ref 5–15)
BUN: 9 mg/dL (ref 6–20)
CO2: 29 mmol/L (ref 22–32)
Calcium: 8.7 mg/dL — ABNORMAL LOW (ref 8.9–10.3)
Chloride: 98 mmol/L (ref 98–111)
Creatinine, Ser: 0.83 mg/dL (ref 0.61–1.24)
GFR, Estimated: >60 mL/min (ref >60)
Glucose, Bld: 81 mg/dL (ref 70–99)
Potassium: 4.1 mmol/L (ref 3.5–5.1)
Sodium: 134 mmol/L — ABNORMAL LOW (ref 135–145)

## 2021-11-16 LAB — HIV-1 RNA, PCR (GRAPH) RFX/GENO EDI
HIV-1 RNA BY PCR: <20 copies/mL
HIV-1 RNA Quant, Log: UNDETERMINED log10copy/mL

## 2021-11-16 NOTE — Progress Notes (Signed)
?Progress Note ? ? ?Patient: Reginald Clarke ZOX:[id number] DOB: 23-Nov-1990 DOA: 11/14/2021     2 ?DOS: the patient was seen and examined on 11/16/2021 ?  ?Brief hospital course: ?Reginald Clarke is a 31 y.o. male with medical history significant of IV drug use, gastroesophageal flux disease, chronic pain (follow-up as an outpatient for methadone clinic) and recent ED visit secondary to community-acquired pneumonia (discharged with intention to be treated with doxycycline as an outpatient); who presented after being called with abnormal blood culture results resembling acute staph virus infection and instructed to return to the emergency department for further evaluation and management.  Patient reported good oxygen saturation on room air and no fever; still complaining of intermittent coughing spells and some pleuritic discomfort.  Also reported chronic ongoing lower back pain. ?Patient expressed no abdominal pain, no nausea, no vomiting, no dysuria, no hematuria, no melena, no hematochezia, no focal weaknesses or any other complaints. ?  ?Repeat chest x-ray demonstrating right basilar pneumonia and a small right pleural effusion.  Urinalysis showing pyuria with negative nitrites; negative COVID PCR and influenza. ?  ?CBC demonstrating WBC 7.7, hemoglobin 10.9 and platelets count 176 K ?Basic metabolic panel with a sodium of 132, potassium 4.0, chloride 99, bicarb 27, BUN 7, creatinine 0.64 ?  ?Assessment and Plan: ?* Bacteremia ?-Prior history of IV drug abuse with recent relapse. ?-Recent ED visit (approximately 3-4 days ago due to URI symptoms and diagnosis of community-acquired pneumonia. ?-Patient with low curb 65 treated as an outpatient with doxycycline. ?-Presented back with positive blood cultures (taken at that time and positive for MSSA bacteremia). ?-Infectious disease has been involved, recommending treatment with Ancef, repeat blood cultures and to assure no other source of infection. ?-Good oxygen saturation  appreciated on room air. ?-Complaining of significant mid thoracic and lower back pain; MRI 8 was performed to rule out presence of any focal infection, findings suggestive of degenerative disease but no septic arthritis, osteomyelitis or abscesses. ?-Pulmonary changes seen with suggestion of septic emboli to (most likely viral of his back discomfort). 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 ?-Following ID recommendations cardiothoracic surgery has been contacted; recommendations provided to transfer patient to [facility] for TEE and further evaluation/cardiothoracic surgical management. Discussed with Dr. Darcey Nora. ?-Continue current IV antibiotics. ?-Patient is hemodynamically stable. ? ?Endocarditis of tricuspid valve ?-Patient will be transferred to [facility] secondary to significant vegetations appreciated on tricuspid valve ?-Will continue current IV antibiotic ?-Patient will need TEE and evaluation for cardiothoracic surgical intervention. ?-Hemodynamically stable currently. ? ?Tobacco abuse ?-Smoking cessation counseling has been provided ?-Patient declined nicotine patch. ? ?GERD (gastroesophageal reflux disease) ?-continue PPI. ? ?CAP (community acquired pneumonia) ?-Partially treated with the use doxycycline as an outpatient ?-Currently receiving Ancef which will also provide appropriate coverage for pneumonia. ?-Continue as needed bronchodilators and supportive care. ?-Good saturation on room air appreciated. ?-Continue as needed antitussives/mucolytic management. ?-Patient is afebrile. ? ?Chronic pain syndrome ?-Reports actively follow as an outpatient for methadone therapy. ?-Continue home methadone dose. ?-Continue Tylenol and as needed Toradol. ? ? ?Subjective:  ?Continue complaining of intermittent back pain and pleuritic discomfort.  No using accessory muscles; no fever, no nausea, no vomiting. ? ?Physical Exam: ?Vitals:  ? 11/15/21 0958  11/15/21 1300 11/16/21 0457 11/16/21 1233  ?BP: 118/70 108/63 117/81 122/72  ?Pulse: (!) 117 (!) 119 (!) 101 (!) 109  ?Resp: _0 ?Temp: 98.3 ?F (36.8 ?C) 98.9 ?F (37.2 ?C)  98.1 ?  F (36.7 ?C)  ?TempSrc: Oral Oral  Oral  ?SpO2: 99% 97%  100%  ?Weight:      ?Height:      ?General exam: Alert, awake, oriented x 3; continue complaining of intermittent back discomfort.  Good oxygen saturation on room air; no fever. ?Respiratory system: Positive scattered rhonchi; no wheezing or crackles.  No using accessory muscle. ?Cardiovascular system:RRR. No rubs or gallops; no JVD on exam. ?Gastrointestinal system: Abdomen is nondistended, soft and nontender. No organomegaly or masses felt. Normal bowel sounds heard. ?Central nervous system: Alert and oriented. No focal neurological deficits. ?Extremities: No cyanosis or clubbing. ?Skin: No rashes, no petechiae; multiple tattoos appreciated. ?Psychiatry: Judgement and insight appear normal. Mood & affect appropriate.  ? ? ?Data Reviewed: ?2D echo: Positive for tricuspid valve endocarditis ?-MRSA PCR negative ?CBC demonstrating WBCs of 2.2; Hemoglobin 10.5; platelets 289 K ?Basic metabolic panel with sodium of 135, potassium 4.2, bicarb 28, BUN 18, creatinine 0.68. ?CRP 15.4 ?ESR 90 ?** (no new labs today) ? ?Family Communication: Careful at bedside. ? ?Disposition: ?Status is: Inpatient ?Remains inpatient appropriate because: Requiring IV antibiotics. ? ? Planned Discharge Destination: Home ? ? ?Author: ?Barton Dubois, MD ?11/16/2021 2:03 PM ? ?For on call review [email or web address].  ?

## 2021-11-17 DIAGNOSIS — I339 Acute and subacute endocarditis, unspecified: Secondary | ICD-10-CM | POA: Diagnosis not present

## 2021-11-17 DIAGNOSIS — R7881 Bacteremia: Secondary | ICD-10-CM | POA: Diagnosis not present

## 2021-11-17 DIAGNOSIS — I340 Nonrheumatic mitral (valve) insufficiency: Secondary | ICD-10-CM | POA: Diagnosis not present

## 2021-11-17 DIAGNOSIS — F1990 Other psychoactive substance use, unspecified, uncomplicated: Secondary | ICD-10-CM | POA: Diagnosis present

## 2021-11-17 DIAGNOSIS — J189 Pneumonia, unspecified organism: Secondary | ICD-10-CM | POA: Diagnosis not present

## 2021-11-17 DIAGNOSIS — I361 Nonrheumatic tricuspid (valve) insufficiency: Secondary | ICD-10-CM | POA: Diagnosis not present

## 2021-11-17 DIAGNOSIS — G894 Chronic pain syndrome: Secondary | ICD-10-CM | POA: Diagnosis not present

## 2021-11-17 DIAGNOSIS — F1910 Other psychoactive substance abuse, uncomplicated: Secondary | ICD-10-CM | POA: Diagnosis present

## 2021-11-17 DIAGNOSIS — K219 Gastro-esophageal reflux disease without esophagitis: Secondary | ICD-10-CM | POA: Diagnosis not present

## 2021-11-17 LAB — RAPID URINE DRUG SCREEN, HOSP PERFORMED
Amphetamines: POSITIVE — AB
Barbiturates: NOT DETECTED
Benzodiazepines: NOT DETECTED
Cocaine: NOT DETECTED
Opiates: NOT DETECTED
Tetrahydrocannabinol: POSITIVE — AB

## 2021-11-17 LAB — CBC
HCT: 34.7 % — ABNORMAL LOW (ref 39.0–52.0)
Hemoglobin: 11.6 g/dL — ABNORMAL LOW (ref 13.0–17.0)
MCH: 30.3 pg (ref 26.0–34.0)
MCHC: 33.4 g/dL (ref 30.0–36.0)
MCV: 90.6 fL (ref 80.0–100.0)
Platelets: 412 10*3/uL — ABNORMAL HIGH (ref 150–400)
RBC: 3.83 MIL/uL — ABNORMAL LOW (ref 4.22–5.81)
RDW: 13.8 % (ref 11.5–15.5)
WBC: 16.4 10*3/uL — ABNORMAL HIGH (ref 4.0–10.5)
nRBC: 0 % (ref 0.0–0.2)

## 2021-11-17 MED ORDER — ALPRAZOLAM 0.5 MG PO TABS
0.5000 mg | ORAL_TABLET | Freq: Three times a day (TID) | ORAL | Status: DC | PRN
  Administered 2021-11-17 – 2021-11-25 (×24): 0.5 mg via ORAL
  Filled 2021-11-17 (×24): qty 1

## 2021-11-17 NOTE — Assessment & Plan Note (Addendum)
-   Cessation counseling provided ?-UDS done during this hospitalization demonstrated positive THC and amphetamines. ?

## 2021-11-17 NOTE — Progress Notes (Signed)
Upon entering the pt's room this LPN noticed that there was a strong smell of cannabis. The pt was sitting on his bed , and pt's girlfriend was at bedside.Will continue to monitor. ?

## 2021-11-17 NOTE — Progress Notes (Signed)
Report given to Dorene Grebe, RN 4E [facility].  ?

## 2021-11-17 NOTE — Progress Notes (Addendum)
Pt called nursing station to be disconnected from tele box so that he cold take a shower again. Pt  stated "that his girlfriend was getting ready to go to work,and she was the only one who could help him take a shower." Educated pt that we have nursing staff that would be more than glad to assist him in all of his ADL needs. ?After disconnecting from tele ,when this LPN went to take the pt towels This Lpn witnessed inappropriate behavior from pt and his girlfriend in shower together and educated pt. Will inform oncoming nursing staff to reintegrate visiting hours moving forward. ?

## 2021-11-17 NOTE — Progress Notes (Signed)
RN noted abnormal behavior for situation with girlfriend at bedside.  UDS was done.  Patient positive for amphetamines and THC.  There is a possibility that girlfriend brought contraband to the patient.  Asking security to ask girlfriend to leave. ?

## 2021-11-17 NOTE — Consult Note (Addendum)
? ?   ?[address] ?      [name] [address] ?            [phone]       ? ?[place] ?[facility] Medical Record #[id number] ?Date of Birth: [birth year] ? ?Referring: Dr. Vassie Loll MD ?Primary Care: Pcp, No ? ?Chief Complaint:    ?Chief Complaint  ?Patient presents with  ? abdnormal lab result  ?Reason for consultation: TV endocarditis ? ?History of Present Illness:     ?This is a 31 year old male with a past medical history of IVDU (followed at methadone clinic), and GERD  who presented to [facility] ED after being called with abnormal blood culture result (MSSA). He had initially presented to [facility] ED on 11/10/2021 with cough, body aches, general malaise, lower abdominal pain, and possible fever. CT abdomen and pelvis showed multifocal small foci of irregular airspace disease and ground-glass disease within the bilateral lower lobes with slightly nodular appearance, suspected to be secondary to respiratory infection and splenomegaly. WBC was 13,300, potassium was 2.6, and platelets were 66,000. Blood cultures were done as well. It was felt he had CAP. He was given Ceftriaxone and Azithromycin. He was stable for discharge on Doxycycline. He was then contacted to present to [facility] ED because Staph Aureus (MSSA) was found in the blood cultures.  ?On 11/14/2021 he presented to [facility] ED for further evaluation. At that time, he had complaints of intermittent coughing, lower back and pleuritic pain, and some shortness of breath. He denied abdominal pain, nausea, vomiting. He had good oxygenation on room air. WBC was 7,700. Chest x ray done showed right basilar pneumonia and small, right para pneumonic effusion. MRI of the lumbar spine showed no evidence of septic arthritis. MRI of the thoracic spine showed scattered patchy multifocal opacities within lungs suspicious for septic emboli (worse on the right). Echo done 11/15/2021 showed LVEF 60-65%, there are multiple, highly mobile  vegetations visualized on the tricuspid valve leaflets with largest measuring 1.4cm x 0.9 cm, moderate to severe TR (hard to quantify). Infectious disease saw the patient and recommended continuation of Cefazolin. Dr. Donata Clay (who was on call at the time) was consulted. He reviewed the echocardiogram and recommended patient be transferred to [facility] for possible consideration of Angio Vac.   ? ? ?Current Activity/ Functional Status: ?Patient is independent with mobility/ambulation, transfers, ADL's, IADL's. ?  ?Zubrod Score: ?At the time of surgery this patient?s most appropriate activity status/level should be described as: ?[]     0    Normal activity, no symptoms ?[x]     1    Restricted in physical strenuous activity but ambulatory, able to do out light work ?[]     2    Ambulatory and capable of self care, unable to do work activities, up and about more than 50%  Of the time                            ?[]     3    Only limited self care, in bed greater than 50% of waking hours ?[]     4    Completely disabled, no self care, confined to bed or chair ?[]     5    Moribund ? ?Past Medical History: ?History reviewed. No pertinent past medical history. ?IVDU-Heroin ?Fracture right foot (no surgery required) ? ?Past Surgical History:  ?Procedure Laterality Date  ?  tubes in ears    ? Left WRIST FRACTURE SURGERY    ? ? ?Social History  ? ?Tobacco Use  ?Smoking Status Every Day  ? Types: Cigarettes  ?Smokeless Tobacco Never  ?  ?Social History  ? ?Substance and Sexual Activity  ?Alcohol Use No  ? ? ? ?Allergies  ?Allergen Reactions  ? Penicillins Hives  ? Amoxil [Amoxicillin Trihydrate]   ? ? ?Current Facility-Administered Medications  ?Medication Dose Route Frequency Provider Last Rate Last Admin  ? acetaminophen (TYLENOL) tablet 650 mg  650 mg Oral Q6H PRN Vassie Loll, MD   650 mg at 11/14/21 1854  ? Or  ? acetaminophen (TYLENOL) suppository 650 mg  650 mg Rectal Q6H PRN Vassie Loll, MD      ? alum & mag  hydroxide-simeth (MAALOX/MYLANTA) 200-200-20 MG/5ML suspension 30 mL  30 mL Oral Q4H PRN Vassie Loll, MD   30 mL at 11/15/21 0406  ? ceFAZolin (ANCEF) IVPB 2g/100 mL premix  2 g Intravenous Q8H Vassie Loll, MD 200 mL/hr at 11/17/21 0939 2 g at 11/17/21 0939  ? enoxaparin (LOVENOX) injection 40 mg  40 mg Subcutaneous Q24H Vassie Loll, MD   40 mg at 11/16/21 1719  ? guaiFENesin-dextromethorphan (ROBITUSSIN DM) 100-10 MG/5ML syrup 5 mL  5 mL Oral Q4H PRN Vassie Loll, MD      ? ipratropium-albuterol (DUONEB) 0.5-2.5 (3) MG/3ML nebulizer solution 3 mL  3 mL Nebulization Q6H PRN Vassie Loll, MD      ? ketorolac (TORADOL) 30 MG/ML injection 30 mg  30 mg Intravenous Q6H PRN Vassie Loll, MD   30 mg at 11/17/21 1791  ? methadone (DOLOPHINE) tablet 130 mg  130 mg Oral Daily Vassie Loll, MD   130 mg at 11/17/21 0804  ? And  ? methadone (DOLOPHINE) tablet 5 mg  5 mg Oral Daily Vassie Loll, MD   5 mg at 11/17/21 0804  ? methocarbamol (ROBAXIN) tablet 500 mg  500 mg Oral Q8H PRN Vassie Loll, MD      ? ondansetron [facility]) tablet 4 mg  4 mg Oral Q6H PRN Vassie Loll, MD      ? Or  ? ondansetron (ZOFRAN) injection 4 mg  4 mg Intravenous Q6H PRN Vassie Loll, MD      ? pantoprazole (PROTONIX) EC tablet 40 mg  40 mg Oral Daily Vassie Loll, MD   40 mg at 11/17/21 0805  ? ? ?Medications Prior to Admission  ?Medication Sig Dispense Refill Last Dose  ? methadone (DOLOPHINE) 1 MG/1ML solution Take 135 mg by mouth daily.   11/14/2021  ? cetirizine-pseudoephedrine (ZYRTEC-D) 5-120 MG per tablet Take 1 tablet by mouth daily. (Patient not taking: Reported on 12/05/2020) 20 tablet 0 Not Taking  ? chlorhexidine (HIBICLENS) 4 % external liquid Apply topically daily as needed. Mix a capful of hibiclens with water and submerge hand for 15-20, 3 times daily (Patient not taking: Reported on 11/14/2021) 120 mL 0 Not Taking  ? clindamycin (CLEOCIN) 150 MG capsule Take 2 capsules (300 mg total) by mouth 4 (four) times  daily. (Patient not taking: Reported on 11/14/2021) 56 capsule 0 Not Taking  ? dexamethasone (DECADRON) 4 MG tablet 1 po bid with food (Patient not taking: Reported on 12/05/2020) 12 tablet 0 Not Taking  ? doxycycline (VIBRAMYCIN) 100 MG capsule Take 1 capsule (100 mg total) by mouth 2 (two) times daily. One po bid x 7 days (Patient not taking: Reported on 11/14/2021) 14 capsule 0 Not Taking  ? potassium chloride  SA (KLOR-CON M) 20 MEQ tablet Take 2 tablets (40 mEq total) by mouth 2 (two) times daily for 7 days. (Patient not taking: Reported on 11/14/2021) 28 tablet 0 Not Taking  ? promethazine-codeine (PHENERGAN WITH CODEINE) 6.25-10 MG/5ML syrup Take 5 mLs by mouth every 6 (six) hours as needed for cough. (Patient not taking: Reported on 12/05/2020) 120 mL 0 Not Taking  ? ? ?History reviewed. No pertinent family history. ?Patient does not speak with mother much. He is close to his father and grandmother. ? ?Review of Systems:  ?Non contributory except as stated in HPI ?  ?  ? ?Physical Exam: ?BP 132/84 (BP Location: Left Arm)   Pulse (!) 105   Temp 98.5 ?F (36.9 ?C) (Oral)   Resp 20   Ht 5\' 10"  (1.778 m)   Wt 70.2 kg   SpO2 100%   BMI 22.21 kg/m?  ? ? ?General appearance: alert, cooperative, and no distress ?Head: Normocephalic, without obvious abnormality, atraumatic ?Neck: no JVD and supple, symmetrical, trachea midline ?Resp: clear to auscultation bilaterally ?Cardio: RRR, no murmur ?GI: Soft, non tender, bowel sounds present ?Extremities: No LE edema. Palpable DP/PT bilaterally ?Neurologic: Grossly normal ? ?Diagnostic Studies & Laboratory data: ?   ? Recent Radiology Findings:  ? MR THORACIC SPINE W WO CONTRAST ? ?Result Date: 11/15/2021 ?CLINICAL DATA:  Initial evaluation for chronic mid back pain. EXAM: MRI THORACIC WITHOUT AND WITH CONTRAST TECHNIQUE: Multiplanar and multiecho pulse sequences of the thoracic spine were obtained without and with intravenous contrast. CONTRAST:  7mL GADAVIST GADOBUTROL 1  MMOL/ML IV SOLN COMPARISON:  None. FINDINGS: Alignment: Physiologic with preservation of the normal thoracic kyphosis. No listhesis. Vertebrae: Vertebral body height maintained without acute or chronic fracture. Bone marrow

## 2021-11-17 NOTE — Progress Notes (Signed)
Pt taking shower.  

## 2021-11-17 NOTE — Progress Notes (Addendum)
Pt out of room in hallway requesting to go to outside, then asked nursing staff if he could go to the chapel. Landscape architect both stated that there is not enough staff to support request at this time. Pt waling in hallway, and was informed that his heart rate was showing on monitors in the high 160's  Cardiovascular:  ?   Rate and Rhythm: Tachycardia present Pt sated "he gotta walk" Will continue to monitor pt for safety.  ?

## 2021-11-17 NOTE — Progress Notes (Signed)
?   11/16/21 1955  ?Assess: MEWS Score  ?Temp 98.8 ?F (37.1 ?C)  ?BP 117/76  ?Pulse Rate (!) 111  ?SpO2 98 %  ?Assess: MEWS Score  ?MEWS Temp 0  ?MEWS Systolic 0  ?MEWS Pulse 2  ?MEWS RR 0  ?MEWS LOC 0  ?MEWS Score 2  ?MEWS Score Color Yellow  ?Assess: if the MEWS score is Yellow or Red  ?Were vital signs taken at a resting state? Yes  ?Focused Assessment Change from prior assessment (see assessment flowsheet)  ?Early Detection of Sepsis Score *See Row Information* Medium  ?MEWS guidelines implemented *See Row Information* Yes  ?Treat  ?MEWS Interventions Escalated (See documentation below)  ?Pain Scale 0-10  ?Pain Score 0  ?Escalate  ?MEWS: Escalate Yellow: discuss with charge nurse/RN and consider discussing with provider and RRT  ?Notify: Charge Nurse/RN  ?Name of Charge Nurse/RN Notified Bree, RN  ?Date Charge Nurse/RN Notified 11/16/21  ?Time Charge Nurse/RN Notified 1955  ?Notify: Provider  ?Provider Name/Title Zierle-Ghosh  ?Date Provider Notified 11/17/21  ?Time Provider Notified 1955  ?Notification Type Page  ?Notification Reason Change in status  ?Provider response No new orders  ?Date of Provider Response 11/16/21  ?Time of Provider Response 2200 ?(provider came to floor)  ?Document  ?Patient Outcome Other (Comment)  ? ? ?

## 2021-11-17 NOTE — Progress Notes (Signed)
Pt stated to nursing staff that that he was concerned that he has enemies, and was asking if someone had moved into the room next door. Pt's girlfriend is at bedside in and out of the bathroom when nursing staff enter the room in bath robe. ?

## 2021-11-17 NOTE — Progress Notes (Signed)
?Progress Note ? ? ?Patient: Reginald Clarke WFU:[id number] DOB: [birth year] DOA: 11/14/2021     3 ?DOS: the patient was seen and examined on 11/17/2021 ?  ?Brief hospital course: ?Reginald Clarke is a 31 y.o. male with medical history significant of IV drug use, gastroesophageal flux disease, chronic pain (follow-up as an outpatient for methadone clinic) and recent ED visit secondary to community-acquired pneumonia (discharged with intention to be treated with doxycycline as an outpatient); who presented after being called with abnormal blood culture results resembling acute staph virus infection and instructed to return to the emergency department for further evaluation and management.  Patient reported good oxygen saturation on room air and no fever; still complaining of intermittent coughing spells and some pleuritic discomfort.  Also reported chronic ongoing lower back pain. ?Patient expressed no abdominal pain, no nausea, no vomiting, no dysuria, no hematuria, no melena, no hematochezia, no focal weaknesses or any other complaints. ?  ?Repeat chest x-ray demonstrating right basilar pneumonia and a small right pleural effusion.  Urinalysis showing pyuria with negative nitrites; negative COVID PCR and influenza. ?  ?CBC demonstrating WBC 7.7, hemoglobin 10.9 and platelets count 176 K ?Basic metabolic panel with a sodium of 132, potassium 4.0, chloride 99, bicarb 27, BUN 7, creatinine 0.64 ?  ?Assessment and Plan: ?* Bacteremia ?-Prior history of IV drug abuse with recent relapse. ?-Recent ED visit (approximately 3-4 days ago due to URI symptoms and diagnosis of community-acquired pneumonia. ?-Patient with low curb 65 treated as an outpatient with doxycycline. ?-Presented back with positive blood cultures (taken at that time and positive for MSSA bacteremia). ?-Infectious disease has been involved, recommending treatment with Ancef, repeat blood cultures and to assure no other source of infection. ?-Good oxygen saturation  appreciated on room air. ?-Complaining of significant mid thoracic and lower back pain; MRI 8 was performed to rule out presence of any focal infection, findings suggestive of degenerative disease but no septic arthritis, osteomyelitis or abscesses. ?-Pulmonary changes seen with suggestion of septic emboli to (most likely viral of his back discomfort). 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 ?-Following ID recommendations cardiothoracic surgery has been contacted; recommendations provided to transfer patient to [facility] for TEE and further evaluation/cardiothoracic surgical management. Discussed with Dr. Maren Beach. ?-Continue current IV antibiotics. ?-Patient is hemodynamically stable. ? ?Endocarditis of tricuspid valve ?-Patient will be transferred to [facility] secondary to significant vegetations appreciated on tricuspid valve ?-Will continue current IV antibiotic ?-Patient will need TEE and evaluation for cardiothoracic surgical intervention. ?-Hemodynamically stable currently. ? ?Polysubstance abuse ([name]) ?- Cessation counseling provided ?-UDS done during this hospitalization demonstrated positive THC and amphetamines. ?-Patient educated about the importance of not consuming any of these medications and definitely not actively continue practice while hospitalized. ? ?Tobacco abuse ?-Smoking cessation counseling has been provided ?-Patient declined nicotine patch. ? ?GERD (gastroesophageal reflux disease) ?-continue PPI. ? ?CAP (community acquired pneumonia) ?-Partially treated with the use doxycycline as an outpatient ?-Currently receiving Ancef which will also provide appropriate coverage for pneumonia. ?-Continue as needed bronchodilators and supportive care. ?-Good saturation on room air appreciated. ?-Continue as needed antitussives/mucolytic management. ?-Patient is afebrile. ? ?Chronic pain syndrome ?-Reports actively follow as an outpatient for  methadone therapy. ?-Continue home methadone dose. ?-Continue Tylenol and as needed Toradol. ? ? ?Subjective:  ?Continues to have intermittent back pain; no nausea, no vomiting, no shortness of breath.  Good saturation on room air.  Currently afebrile.  Overnight with some changes in his  behavior and there was some concerns by nursing staff regarding interaction with patient's girlfriend. ? ?Physical Exam: ?Vitals:  ? 11/16/21 1233 11/16/21 1955 11/17/21 0049 11/17/21 0329  ?BP: 122/72 117/76 140/84 113/72  ?Pulse: (!) 109 (!) 111 (!) 160 (!) 118  ?Resp: 20   19  ?Temp: 98.1 ?F (36.7 ?C) 98.8 ?F (37.1 ?C)  98.9 ?F (37.2 ?C)  ?TempSrc: Oral Oral  Oral  ?SpO2: 100% 98% 99% 96%  ?Weight:      ?Height:      ? ?General exam: Alert, awake, oriented x 3; continue to report intermittent back pain.  Good saturation on room air.  No fever.  Patient reports no nausea or vomiting. ?Respiratory system: Clear to auscultation. Respiratory effort normal.  Good saturation on room air. ?Cardiovascular system: Sinus tachycardia; no rubs, no gallops, no JVD.   ?Gastrointestinal system: Abdomen is nondistended, soft and nontender. No organomegaly or masses felt. Normal bowel sounds heard. ?Central nervous system: Alert and oriented. No focal neurological deficits. ?Extremities: No cyanosis or clubbing. ?Skin: No petechiae. ?Psychiatry: Judgement and insight appear normal. Mood & affect appropriate.  ? ?Data Reviewed: ?2D echo: Positive for tricuspid valve endocarditis ?-MRSA PCR negative ?CBC demonstrating WBCs of 16.4, hemoglobin 11.6; platelets 412 K ?-UDS done on 11/17/2021 early morning demonstrated positive amphetamines and marijuana (patient reported he consumed it Prior to admission).. ? ?Family Communication: Careful at bedside. ? ?Disposition: ?Status is: Inpatient ?Remains inpatient appropriate because: Requiring IV antibiotics. ? ? Planned Discharge Destination: Home ? ? ?Author: ?Vassie Loll, MD ?11/17/2021 11:08 AM ? ?For  on call review [email or web address].  ?

## 2021-11-17 NOTE — Progress Notes (Signed)
Pt admitted to unit, CHG given , CCMD called. Pt alert and oriented x4. Pt ST ? ?Reginald Lemmings, RN ? ? ? ? 11/17/21 1215  ?Vitals  ?Temp 98.5 ?F (36.9 ?C)  ?Temp Source Oral  ?BP 132/84  ?MAP (mmHg) 98  ?BP Location Left Arm  ?BP Method Automatic  ?Patient Position (if appropriate) Lying  ?Pulse Rate (!) 105  ?Pulse Rate Source Monitor  ?ECG Heart Rate (!) 106  ?Resp 20  ?Level of Consciousness  ?Level of Consciousness Alert  ?MEWS COLOR  ?MEWS Score Color Green  ?Oxygen Therapy  ?SpO2 100 %  ?O2 Device Room Air  ?O2 Flow Rate (L/min) 0 L/min  ?Pain Assessment  ?Pain Scale 0-10  ?Pain Score 4  ?Pain Type Chronic pain  ?Pain Location Back  ?MEWS Score  ?MEWS Temp 0  ?MEWS Systolic 0  ?MEWS Pulse 1  ?MEWS RR 0  ?MEWS LOC 0  ?MEWS Score 1  ? ? ?

## 2021-11-18 DIAGNOSIS — R7881 Bacteremia: Secondary | ICD-10-CM | POA: Diagnosis not present

## 2021-11-18 MED ORDER — ALBUTEROL SULFATE (2.5 MG/3ML) 0.083% IN NEBU: 2.5000 mg | INHALATION_SOLUTION | RESPIRATORY_TRACT | Status: DC | PRN

## 2021-11-18 NOTE — Progress Notes (Signed)
? ?Reginald Clarke  KZL:[id number] DOB: 10-05-1990 DOA: 11/14/2021 ?PCP: Pcp, No   ? ?Brief Narrative:  ?31yo with a history of IV drug abuse, GERD, chronic pain followed in the methadone clinic, and recent diagnosis of community-acquired pneumonia who returned to the ED when she was called due to a blood culture resulting positive for staph.  On her return she was still complaining of intermittent coughing spells and some pleuritic type chest pain.  CXR noted a right basilar infiltrate and a small right pleural effusion. ? ?Consultants:  ?TCTS ?ID ? ?Code Status: FULL CODE ? ?DVT prophylaxis: ?Lovenox ? ?Interim Hx: ?Afebrile.  Vital signs stable.  Saturation 95% room air.  In good spirits.  Walking up and down the hallway with no difficulty.  Denies chest pain shortness of breath fevers or chills. ? ?Assessment & Plan: ? ?Methicillin susceptible Staph bacteremia ?Antibiotics as directed by ID presently utilizing Ancef -MRI of lumbar and thoracic spine revealed no evidence of septic arthritis osteomyelitis or abscess -pulmonary imaging suggest possible septic emboli -TTE consistent with tricuspid valve endocarditis -[facility] for TCTS eval ? ?Tricuspid valve endocarditis ?Antibiotics per ID ? ?Community-acquired pneumonia ?Diagnosed prior to this admission during an ED visit 11/10/21 -adequately covered with current antibiotic choice ? ?Polysubstance abuse ?UDS positive for THC and amphetamines ? ?Chronic pain syndrome ?Actively followed in outpatient methadone clinic -continue usual methadone dose but avoid use of other narcotics ? ?Tobacco abuse ?Has been counseled on the need to stop smoking ? ? ?Family Communication: No family present at time of exam ?Disposition: ? ? ?Objective: ?Blood pressure 120/77, pulse 82, temperature 97.9 ?F (36.6 ?C), temperature source Oral, resp. rate 18, height 5\' 10"  (1.778 m), weight 70.2 kg, SpO2 95 %. ? ?Intake/Output Summary (Last 24 hours) at  11/18/2021 1014 ?Last data filed at 11/18/2021 0900 ?Gross per 24 hour  ?Intake 600 ml  ?Output 600 ml  ?Net 0 ml  ? ?Filed Weights  ? 11/14/21 1023 11/14/21 2000  ?Weight: 70.8 kg 70.2 kg  ? ? ?Examination: ?General: No acute respiratory distress ?Lungs: Clear to auscultation bilaterally without wheezes or crackles ?Cardiovascular: Regular rate and rhythm  ?Abdomen: Nontender, nondistended, soft, bowel sounds positive, no rebound, no ascites, no appreciable mass ?Extremities: No significant cyanosis, clubbing, or edema bilateral lower extremities ? ?CBC: ?Recent Labs  ?Lab 11/14/21 ?1235 11/15/21 ?0503 11/17/21 ?11/19/21  ?WBC 7.7 12.2* 16.4*  ?NEUTROABS 5.7  --   --   ?HGB 10.9* 10.5* 11.6*  ?HCT 33.8* 32.7* 34.7*  ?MCV 91.1 91.6 90.6  ?PLT 176 289 412*  ? ?Basic Metabolic Panel: ?Recent Labs  ?Lab 11/14/21 ?1235 11/15/21 ?0503 11/16/21 ?1430  ?NA 132* 135 134*  ?K 4.0 4.2 4.1  ?CL 99 100 98  ?CO2 27 28 29   ?GLUCOSE 85 103* 81  ?BUN 7 8 9   ?CREATININE 0.64 0.68 0.83  ?CALCIUM 8.0* 8.3* 8.7*  ? ?GFR: ?Estimated Creatinine Clearance: 129.2 mL/min (by C-G formula based on SCr of 0.83 mg/dL). ? ? ?Scheduled Meds: ? enoxaparin (LOVENOX) injection  40 mg Subcutaneous Q24H  ? methadone  130 mg Oral Daily  ? And  ? methadone  5 mg Oral Daily  ? pantoprazole  40 mg Oral Daily  ? ?Continuous Infusions: ?  ceFAZolin (ANCEF) IV 2 g (11/18/21 0352)  ? ? ? LOS: 4 days  ? ? , MD ?[facility] ?Office  [phone] ?Pager - Text Page per 11/20/21 ? ?If 7PM-7AM, please contact  night-coverage per Amion ?11/18/2021, 10:14 AM ? ? ? ? ?

## 2021-11-18 NOTE — Progress Notes (Signed)
? ?   ?   ?   ?   ?   ?   ?   ?   ?   ?   ?   ?   ?   ?   ?   ?   ?   ?   ?   ?   ?   ?   ?   ?   ?   ?   ?   ?   ?   ?   ?   ?   ?   ?   ?   ?   ?   ?   ?   ?   ?   ?   ?   ?   ?   ?   ?   ?   ?   ?   ?   ?   ?   ?   ?   ?   ?   ?   ?   ?   ?   ?   ?   ?   ?   ?   ?   ?   ?   ?   ?   ?   ?   ?  ?   ?  [address] ?      Reginald Clarke [id number] ?            [id number]   ? ?  ? ? ?   ? ?Subjective: ?Patient sleeping with girlfriend in bed. He was briefly awakened;no complaints this am. ? ?Objective: ?Vital signs in last 24 hours: ?Temp:  [98 ?F (36.7 ?C)-98.5 ?F (36.9 ?C)] 98.2 ?F (36.8 ?C) (04/16 0100) ?Pulse Rate:  [78-105] 78 (04/16 0100) ?Cardiac Rhythm: Normal sinus rhythm (04/15 1904) ?Resp:  [18-20] 18 (04/16 0100) ?BP: (114-132)/(72-84) 118/72 (04/16 0100) ?SpO2:  [95 %-100 %] 98 % (04/16 0100) ? ? ?Current Weight  ?11/14/21 70.2 kg  ? ?  ? ?Intake/Output from previous day: ?04/15 0701 - 04/16 0700 ?In: 600 [IV Piggyback:600] ?Out: -  ? ? ?Physical Exam: ? ?Cardiovascular: RRR, no murmur ?Pulmonary: Clear to auscultation bilaterally ?Extremities: No lower extremity edema. ? ? ?Lab Results: ?CBC: ?Recent Labs  ?  11/17/21 ?9326  ?WBC 16.4*  ?HGB 11.6*  ?HCT 34.7*  ?PLT 412*  ? ?BMET:  ?Recent Labs  ?  11/16/21 ?1430  ?NA 134*  ?K 4.1  ?CL 98  ?CO2 29  ?GLUCOSE 81  ?BUN 9  ?CREATININE 0.83  ?CALCIUM 8.7*  ?  ?PT/INR: No results found for: INR, PROTIME ?ABG:  ?INR: ?Will add last result for INR, ABG once components are confirmed ?Will add last 4 CBG results once components are confirmed ? ?Assessment/Plan: ?TV endocarditis, moderate to severe TR-Dr. Cliffton Asters will evaluate on Monday to see if candidate for TEE/Angio Vac.  ?2. History of IVDU. Urine drug screen showed Amphetamines and THC ?3. History of chronic pain syndrome-on Methadone, Toradol PRN ?4. ID-Leukocytosis as WBC yesterday was 16,400. on Ancef for CAP and MSSA bacteremia/TV endocarditis. Await repeat blood cultures ?5. History of  tobacco abuse-encouraged cessation ? ? ?Reginald Murfin M ZimmermanPA-C ?7:43 AM ?  ?

## 2021-11-19 ENCOUNTER — Inpatient Hospital Stay (HOSPITAL_COMMUNITY): Payer: BLUE CROSS/BLUE SHIELD

## 2021-11-19 DIAGNOSIS — A4101 Sepsis due to Methicillin susceptible Staphylococcus aureus: Secondary | ICD-10-CM

## 2021-11-19 DIAGNOSIS — I079 Rheumatic tricuspid valve disease, unspecified: Secondary | ICD-10-CM | POA: Diagnosis not present

## 2021-11-19 DIAGNOSIS — R7881 Bacteremia: Secondary | ICD-10-CM | POA: Diagnosis not present

## 2021-11-19 DIAGNOSIS — F1910 Other psychoactive substance abuse, uncomplicated: Secondary | ICD-10-CM | POA: Diagnosis not present

## 2021-11-19 DIAGNOSIS — M549 Dorsalgia, unspecified: Secondary | ICD-10-CM

## 2021-11-19 DIAGNOSIS — J90 Pleural effusion, not elsewhere classified: Secondary | ICD-10-CM

## 2021-11-19 IMAGING — US IR CHEST US
1 series · 4 of 4 positions shown · non-contrast
Comparison: Thoracic spine MRI from [DATE]

CLINICAL DATA: 30-year-old male presenting for thoracentesis

EXAM:
CHEST ULTRASOUND

[Series 1: ir (person_name)/(person_name) · 4 of 4 slices shown]
[im 1/4]
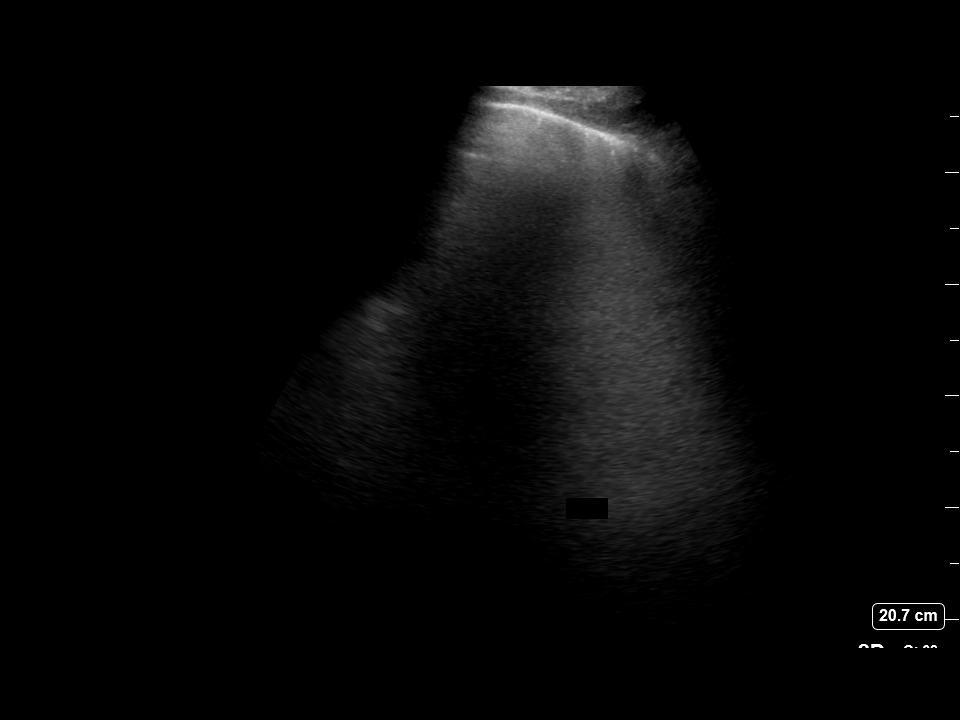
[im 2/4]
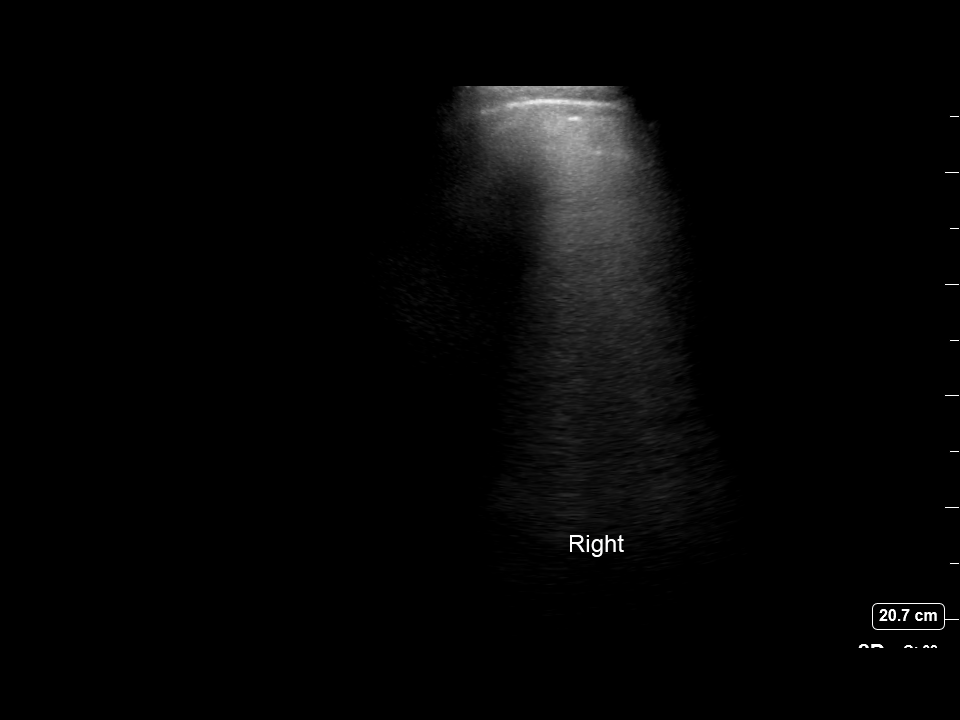
[im 3/4]
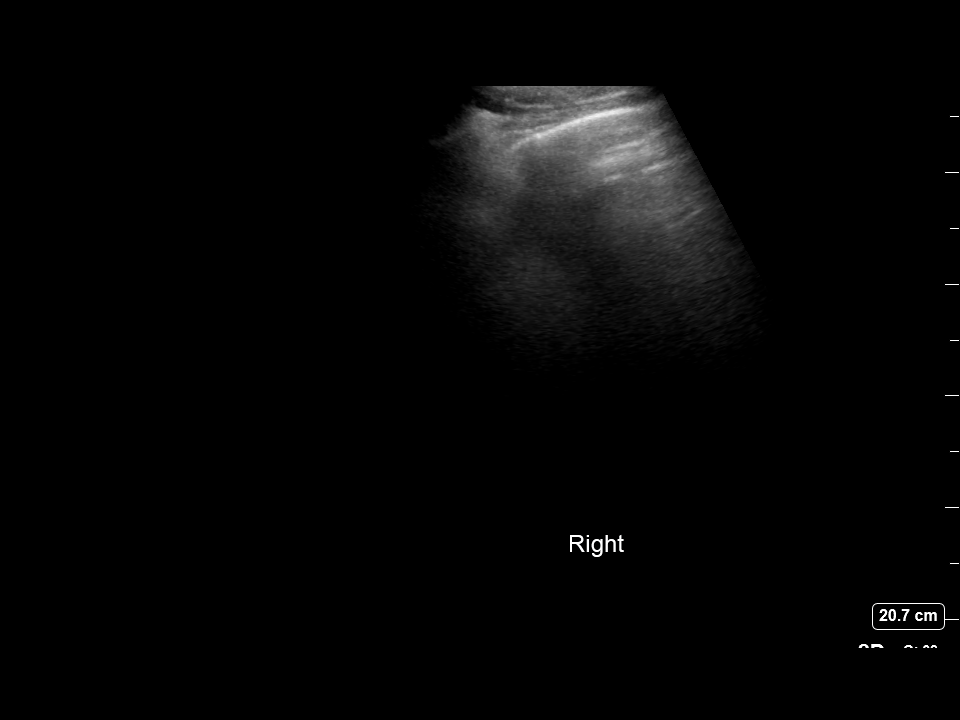
[im 4/4]
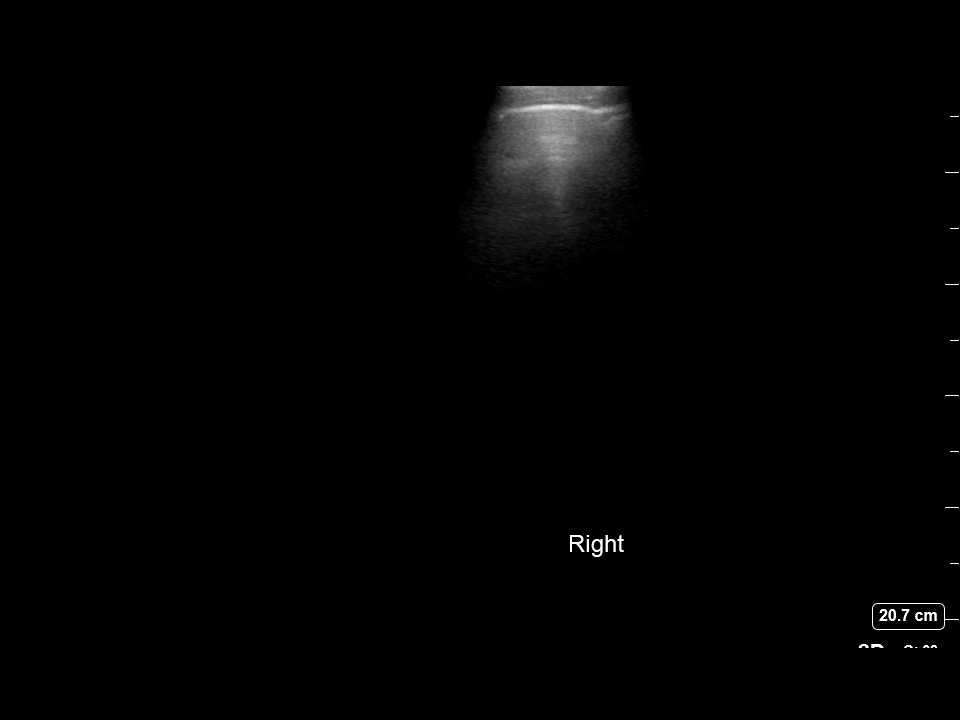

[4 of 4 positions shown; findings below may reference images not displayed]

FINDINGS: Bilateral chest was evaluated with ultrasound which demonstrated no
evidence of bilateral pleural effusion.
IMPRESSION: No evidence of pleural effusion bilaterally. No thoracentesis was
performed.

## 2021-11-19 IMAGING — DX DG ORTHOPANTOGRAM /PANORAMIC
1 series · 1 of 1 positions shown · non-contrast
Comparison: None.

CLINICAL DATA: Tooth pain

EXAM:
ORTHOPANTOGRAM/PANORAMIC

[view not recorded]
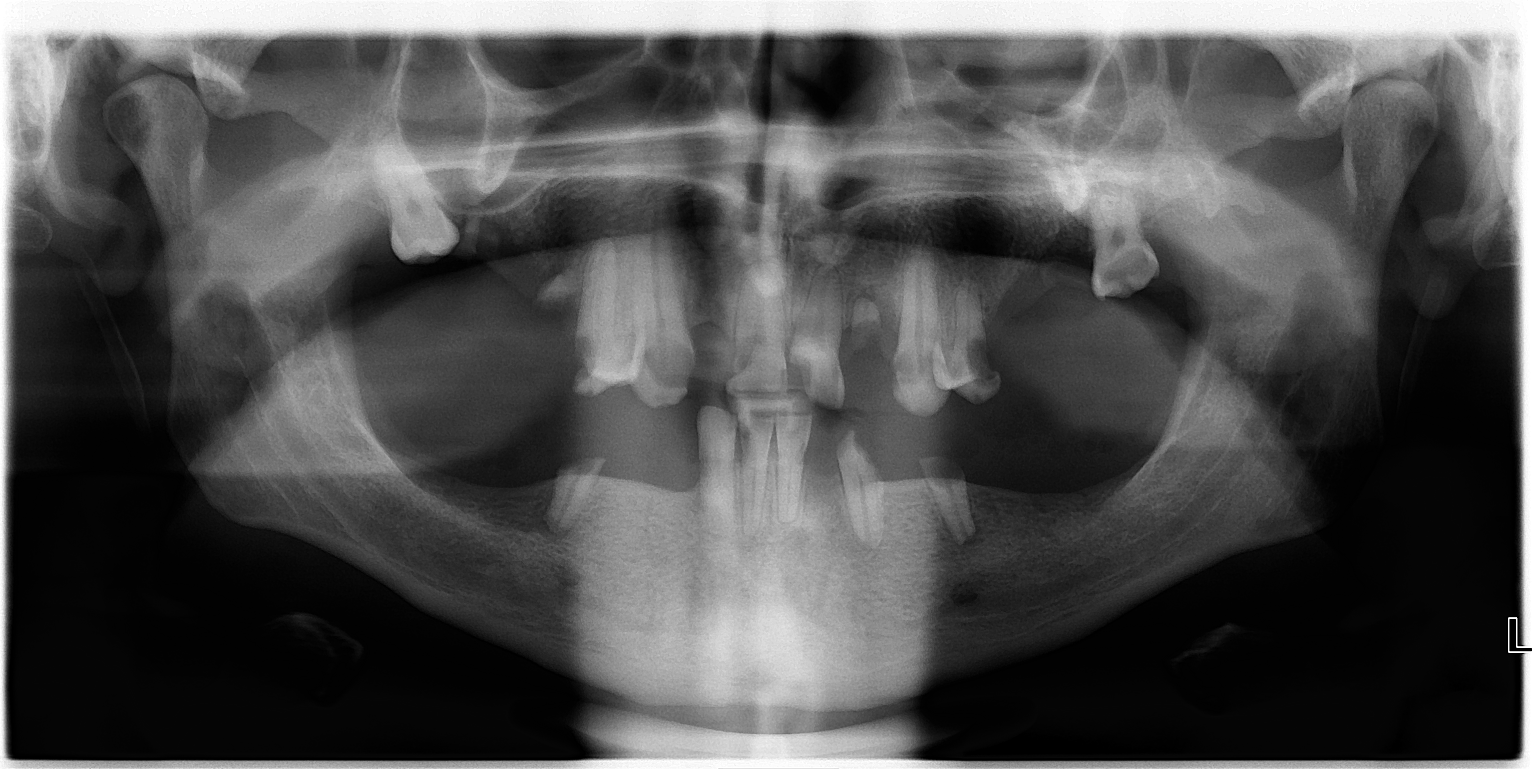

[1 of 1 positions shown; findings below may reference images not displayed]

FINDINGS: Panorex view of the mandible was performed. Extremely poor dentition
is noted. There are numerous missing teeth bilaterally. Marked
erosive changes are seen within the residual upper and lower
premolars and molars. Erosive changes are seen within the upper
incisors. No acute bony abnormalities. Visualized sinuses are clear.
IMPRESSION: 1. Extremely poor dentition, with numerous missing teeth bilaterally
and marked erosive changes of the residual incisors, premolars, and
molars.
2. No acute bony abnormality.

## 2021-11-19 MED ORDER — LIDOCAINE HCL 1 % IJ SOLN
INTRAMUSCULAR | Status: AC
  Filled 2021-11-19: qty 20

## 2021-11-19 NOTE — Progress Notes (Addendum)
? ?Reginald Clarke  YOM:[id number] DOB: [birth year] DOA: 11/14/2021 ?PCP: Pcp, No   ? ?Brief Narrative:  ?30yo with a history of IV drug abuse, GERD, chronic pain followed in the methadone clinic, and recent diagnosis of community-acquired pneumonia who returned to the ED when she was called due to a blood culture resulting positive for staph.  On her return she was still complaining of intermittent coughing spells and some pleuritic type chest pain.  CXR noted a right basilar infiltrate and a small right pleural effusion. ? ?Consultants:  ?TCTS ?ID ? ?Code Status: FULL CODE ? ?DVT prophylaxis: ?Lovenox ? ?Interim Hx: ?Afebrile.  Vital signs stable.  Resting comfortably in bed.  Has no new complaints. ? ?Assessment & Plan: ? ?Methicillin susceptible Staph bacteremia ?Antibiotics as directed by ID presently utilizing Ancef -MRI of lumbar and thoracic spine revealed no evidence of septic arthritis osteomyelitis or abscess -pulmonary imaging suggests possible septic emboli -TTE consistent with tricuspid valve endocarditis -patient transferred [facility] for TCTS eval ? ?Tricuspid valve endocarditis ?Due to above - antibiotics per ID -being evaluated for candidacy for angio VAC ? ?Community-acquired pneumonia ?Diagnosed prior to this admission during an ED visit 11/10/21 -adequately covered with current antibiotic choice -pleural effusion for thoracentesis in IR ? ?Polysubstance abuse ?UDS positive for THC and amphetamines ? ?Hepatitis C ?HCVRNA positive at 796,000 copies -care as per ID ? ?Chronic pain syndrome ?Actively followed in outpatient methadone clinic -continue usual methadone dose but avoid use of other narcotics ? ?Tobacco abuse ?Has been counseled on the need to stop smoking ? ? ?Family Communication: Spoke with father at bedside at length ?Disposition: From home -ultimate disposition will depend upon medical course ? ? ?Objective: ?Blood pressure 109/61, pulse 98, temperature 98.3 ?F (36.8 ?C),  temperature source Oral, resp. rate 18, height 5\' 10"  (1.778 m), weight 70.2 kg, SpO2 96 %. ? ?Intake/Output Summary (Last 24 hours) at 11/19/2021 1007 ?Last data filed at 11/19/2021 11/21/2021 ?Gross per 24 hour  ?Intake 236 ml  ?Output 2000 ml  ?Net -1764 ml  ? ? ?Filed Weights  ? 11/14/21 1023 11/14/21 2000  ?Weight: 70.8 kg 70.2 kg  ? ? ?Examination: ?General: No acute respiratory distress ?Lungs: Good air movement in all fields bilaterally no wheezing ?Cardiovascular: Regular rate and rhythm  ?Abdomen: Nontender, nondistended, soft, bowel sounds positive, no rebound, no ascites, no appreciable mass ?Extremities: No significant cyanosis, clubbing, or edema bilateral lower extremities ? ?CBC: ?Recent Labs  ?Lab 11/14/21 ?1235 11/15/21 ?0503 11/17/21 ?11/19/21  ?WBC 7.7 12.2* 16.4*  ?NEUTROABS 5.7  --   --   ?HGB 10.9* 10.5* 11.6*  ?HCT 33.8* 32.7* 34.7*  ?MCV 91.1 91.6 90.6  ?PLT 176 289 412*  ? ? ?Basic Metabolic Panel: ?Recent Labs  ?Lab 11/14/21 ?1235 11/15/21 ?0503 11/16/21 ?1430  ?NA 132* 135 134*  ?K 4.0 4.2 4.1  ?CL 99 100 98  ?CO2 27 28 29   ?GLUCOSE 85 103* 81  ?BUN 7 8 9   ?CREATININE 0.64 0.68 0.83  ?CALCIUM 8.0* 8.3* 8.7*  ? ? ?GFR: ?Estimated Creatinine Clearance: 129.2 mL/min (by C-G formula based on SCr of 0.83 mg/dL). ? ? ?Scheduled Meds: ? enoxaparin (LOVENOX) injection  40 mg Subcutaneous Q24H  ? methadone  130 mg Oral Daily  ? And  ? methadone  5 mg Oral Daily  ? pantoprazole  40 mg Oral Daily  ? ?Continuous Infusions: ?  ceFAZolin (ANCEF) IV 2 g (11/19/21 0949)  ? ? ? LOS: 5  days  ? ?Lonia Blood, MD ?[facility] ?Office  [phone] ?Pager - Text Page per Loretha Stapler ? ?If 7PM-7AM, please contact night-coverage per Amion ?11/19/2021, 10:07 AM ? ? ? ? ?

## 2021-11-19 NOTE — Consult Note (Signed)
?  [facility]  ? ? ?Date of Admission:  11/14/2021    ? ?Reason for Consult: Bacteremia ?    ?Referring Physician: Dr Sharon Seller ? ?Current antibiotics: ?Cefazolin ? ? ?ASSESSMENT:   ? ?31 y.o. male admitted with: ? ?MSSA bacteremia: Initial blood cultures are positive from 11/11/21 and negative as of 11/14/21. ?Tricuspid valve endocarditis: TTE on 11/15/21 notable for a 1.4x0.9cm vegetation with likely moderate-severe regurgitation.  Transferred to [facility] for CVTS eval, TEE, and possible angiovac procedure. ?Septic pulmonary emboli: Complicated by right pleural effusion. ?Back pain: MRI T and L spine 4/13 most notable for reactive edema about L4-5 and L5-S1 facets.  No convincing evidence for infection at this time but may require short interval follow up study. ?Sepsis: Due to #1-3. ?Hepatitis C: HCV RNA positive at 796,000 copies. ? ?RECOMMENDATIONS:   ? ?Continue cefazolin ?Appreciate CVTS evaluation for TEE and possible angiovac ?Follow up cultures ?Monitor back pain.  May need repeat imaging in the next 1-2 weeks depending on back pain improvement ?IR evaluation for thoracentesis of right pleural effusion ?Hepatitis C treatment to be done outpatient ?Will follow ? ? ?Principal Problem: ?  Bacteremia ?Active Problems: ?  Chronic pain syndrome ?  CAP (community acquired pneumonia) ?  GERD (gastroesophageal reflux disease) ?  Tobacco abuse ?  Endocarditis of tricuspid valve ?  Polysubstance abuse ([name]) ? ? ?MEDICATIONS:   ? ?Scheduled Meds: ? enoxaparin (LOVENOX) injection  40 mg Subcutaneous Q24H  ? methadone  130 mg Oral Daily  ? And  ? methadone  5 mg Oral Daily  ? pantoprazole  40 mg Oral Daily  ? ?Continuous Infusions: ?  ceFAZolin (ANCEF) IV 2 g (11/19/21 0949)  ? ?PRN Meds:.acetaminophen **OR** [DISCONTINUED] acetaminophen, albuterol, ALPRAZolam, alum & mag hydroxide-simeth, guaiFENesin-dextromethorphan, ketorolac, methocarbamol, ondansetron **OR** ondansetron (ZOFRAN) IV ? ?HPI:    ? ?Reginald Clarke is a 31 y.o. male with PMHx of substance use disorder admitted to [facility] with MSSA bacteremia and TV endocarditis.  Transferred to [facility] yesterday for CVTS evaluation.  Previously was followed by ID at [facility] via chart review but no formal consultation or in person or video visit done.  Seen this morning with grandparents at bedside and doing better since being admitted.  Still reports some mid back pain and SOB symptoms.  Tolerating Ancef so far. ? ? ?History reviewed. No pertinent past medical history. ? ?Social History  ? ?Tobacco Use  ? Smoking status: Every Day  ?  Types: Cigarettes  ? Smokeless tobacco: Never  ?Substance Use Topics  ? Alcohol use: No  ? Drug use: No  ? ? ?History reviewed. No pertinent family history. ? ?Allergies  ?Allergen Reactions  ? Penicillins Hives  ? Amoxil [Amoxicillin Trihydrate]   ? ? ?Review of Systems  ?Constitutional: Negative.   ?Respiratory:  Positive for cough and shortness of breath.   ?Cardiovascular: Negative.   ?Gastrointestinal: Negative.   ?Musculoskeletal:  Positive for back pain. Negative for joint pain and neck pain.  ?All other systems reviewed and are negative. ? ?OBJECTIVE:  ? ?Blood pressure 109/61, pulse 98, temperature 98.3 ?F (36.8 ?C), temperature source Oral, resp. rate 18, height 5\' 10"  (1.778 m), weight 70.2 kg, SpO2 96 %. ?Body mass index is 22.21 kg/m?. ? ?Physical Exam ?Constitutional:   ?   General: He is not in acute distress. ?   Appearance: Normal appearance.  ?HENT:  ?   Head: Normocephalic and atraumatic.  ?   Mouth/Throat:  ?  Comments: Dentition is poor.  ?Eyes:  ?   Extraocular Movements: Extraocular movements intact.  ?   Conjunctiva/sclera: Conjunctivae normal.  ?Cardiovascular:  ?   Rate and Rhythm: Normal rate and regular rhythm.  ?   Heart sounds: Murmur heard.  ?Pulmonary:  ?   Effort: Pulmonary effort is normal. No respiratory distress.  ?   Breath sounds: Rhonchi present.  ?Musculoskeletal:     ?   General: Normal range of motion.   ?   Right lower leg: No edema.  ?   Left lower leg: No edema.  ?Skin: ?   General: Skin is warm and dry.  ?Neurological:  ?   General: No focal deficit present.  ?   Mental Status: He is alert and oriented to person, place, and time.  ?Psychiatric:     ?   Mood and Affect: Mood normal.     ?   Behavior: Behavior normal.  ? ? ? ?Lab Results: ?Lab Results  ?Component Value Date  ? WBC 16.4 (H) 11/17/2021  ? HGB 11.6 (L) 11/17/2021  ? HCT 34.7 (L) 11/17/2021  ? MCV 90.6 11/17/2021  ? PLT 412 (H) 11/17/2021  ?  ?Lab Results  ?Component Value Date  ? NA 134 (L) 11/16/2021  ? K 4.1 11/16/2021  ? CO2 29 11/16/2021  ? GLUCOSE 81 11/16/2021  ? BUN 9 11/16/2021  ? CREATININE 0.83 11/16/2021  ? CALCIUM 8.7 (L) 11/16/2021  ? GFRNONAA >60 11/16/2021  ?  ?Lab Results  ?Component Value Date  ? ALT 18 11/10/2021  ? AST 30 11/10/2021  ? ALKPHOS 78 11/10/2021  ? BILITOT 0.6 11/10/2021  ? ? ?   ?Component Value Date/Time  ? CRP 15.4 (H) 11/14/2021 1324  ? ? ?   ?Component Value Date/Time  ? ESRSEDRATE 90 (H) 11/14/2021 1324  ? ? ?I have reviewed the micro and lab results in Epic. ? ?Imaging: ?No results found.  ? ?Imaging independently reviewed in Epic. ? ?Kathlynn Grate ?[facility] ?[facility] ?[phone] pager ?11/19/2021, 11:06 AM ? ? ?

## 2021-11-20 DIAGNOSIS — I079 Rheumatic tricuspid valve disease, unspecified: Secondary | ICD-10-CM | POA: Diagnosis not present

## 2021-11-20 DIAGNOSIS — A4101 Sepsis due to Methicillin susceptible Staphylococcus aureus: Secondary | ICD-10-CM | POA: Diagnosis not present

## 2021-11-20 DIAGNOSIS — F1910 Other psychoactive substance abuse, uncomplicated: Secondary | ICD-10-CM | POA: Diagnosis not present

## 2021-11-20 DIAGNOSIS — R7881 Bacteremia: Secondary | ICD-10-CM | POA: Diagnosis not present

## 2021-11-20 DIAGNOSIS — B182 Chronic viral hepatitis C: Secondary | ICD-10-CM

## 2021-11-20 LAB — CULTURE, BLOOD (ROUTINE X 2)
Culture: NO GROWTH
Culture: NO GROWTH
Special Requests: ADEQUATE

## 2021-11-20 MED ORDER — KETOROLAC TROMETHAMINE 30 MG/ML IJ SOLN
30.0000 mg | Freq: Four times a day (QID) | INTRAMUSCULAR | Status: AC | PRN
  Administered 2021-11-20 – 2021-11-25 (×14): 30 mg via INTRAVENOUS
  Filled 2021-11-20 (×15): qty 1

## 2021-11-20 MED ORDER — TRAMADOL HCL 50 MG PO TABS
50.0000 mg | ORAL_TABLET | Freq: Four times a day (QID) | ORAL | Status: DC | PRN
  Administered 2021-11-20: 50 mg via ORAL
  Filled 2021-11-20: qty 1

## 2021-11-20 NOTE — Progress Notes (Signed)
?   ? ?[facility] ? ?Date of Admission:  11/14/2021    ?       ?Reason for visit: Follow up on MSSA bacteremia ? ?Current antibiotics: ?Cefazolin ? ? ?ASSESSMENT:   ? ?31 y.o. male admitted with: ? ?MSSA bacteremia: Initial blood cultures positive from 4/9 and finalized as negative as of 11/14/2021. ?Tricuspid valve endocarditis: TTE on 4/13 notable for a 1.4 x 0.9 cm vegetation with likely moderate to severe regurgitation.  Transferred to [facility] for CVTS evaluation and TEE with possible angio vac procedure.  TEE is pending at this time. ?Septic pulmonary emboli: Complicated by right pleural effusion that resolved on IR evaluation as of 4/17. ?Back pain: MRI thoracic and lumbar spine 4/13 was most notable for reactive edema about L4-5 and L5-S1 facets.  No convincing evidence for infection but may require short interval follow-up depending on his clinical response. ?Sepsis: Due to #1-3.  Improved. ?Hepatitis C: HCV RNA positive at 796,000 copies. ?Substance use disorder: Hx of heroin use currently on methadone.  More recent hx of meth use including intravenous use leading to current infectious complications. ? ?RECOMMENDATIONS:   ? ?Continue cefazolin ?Await TEE and possible angio VAC ?Monitor back pain.  May need repeat imaging in the next 1 to 2 weeks depending on back pain improvement ?Appreciate IR evaluation for possible thoracentesis.  Pleural effusion resolved at this time. ?Hepatitis C treatment to be arranged outpatient ?Will follow ? ? ?Principal Problem: ?  Bacteremia ?Active Problems: ?  Chronic pain syndrome ?  CAP (community acquired pneumonia) ?  GERD (gastroesophageal reflux disease) ?  Tobacco abuse ?  Endocarditis of tricuspid valve ?  Polysubstance abuse ([name]) ? ? ? ?MEDICATIONS:   ? ?Scheduled Meds: ? enoxaparin (LOVENOX) injection  40 mg Subcutaneous Q24H  ? methadone  130 mg Oral Daily  ? And  ? methadone  5 mg Oral Daily  ? pantoprazole  40 mg Oral Daily   ? ?Continuous Infusions: ?  ceFAZolin (ANCEF) IV 2 g (11/20/21 0329)  ? ?PRN Meds:.acetaminophen **OR** [DISCONTINUED] acetaminophen, albuterol, ALPRAZolam, alum & mag hydroxide-simeth, guaiFENesin-dextromethorphan, methocarbamol, ondansetron **OR** ondansetron (ZOFRAN) IV ? ?SUBJECTIVE:  ? ?24 hour events:  ?S/p [place] with no evidence of pleural effusion noted. ?Orthopantogram with extremely poor dentition and numerous teeth missing w/o acute bony abnormality ?4/12 blood cx NGTD ?No new labs ?Afebrile ?On room air ?No other events noted ? ?Patient reports overall feeling stable.  Reports that he is having some back pain now that his Toradol has been decreased but this is otherwise stable.  No fevers.  No shortness of breath. ? ?Review of Systems  ?All other systems reviewed and are negative. ? ?  ?OBJECTIVE:  ? ?Blood pressure 113/74, pulse 100, temperature 98.9 ?F (37.2 ?C), temperature source Oral, resp. rate 20, height 5\' 10"  (1.778 m), weight 70.2 kg, SpO2 94 %. ?Body mass index is 22.21 kg/m?. ? ?Physical Exam ?Constitutional:   ?   Comments: Chronically ill-appearing man, lying in bed.  Accompanied by his girlfriend today.  ?HENT:  ?   Head: Normocephalic and atraumatic.  ?   Mouth/Throat:  ?   Comments: His dentition is poor. ?Eyes:  ?   Extraocular Movements: Extraocular movements intact.  ?   Conjunctiva/sclera: Conjunctivae normal.  ?Cardiovascular:  ?   Rate and Rhythm: Normal rate and regular rhythm.  ?   Heart sounds: Murmur heard.  ?Pulmonary:  ?   Effort: Pulmonary effort is normal. No  respiratory distress.  ?   Breath sounds: Normal breath sounds.  ?Abdominal:  ?   General: There is no distension.  ?   Palpations: Abdomen is soft.  ?   Tenderness: There is no abdominal tenderness.  ?Musculoskeletal:     ?   General: Normal range of motion.  ?   Cervical back: Normal range of motion and neck supple.  ?Skin: ?   General: Skin is warm and dry.  ?Neurological:  ?   General: No focal deficit present.  ?    Mental Status: He is oriented to person, place, and time.  ?Psychiatric:     ?   Mood and Affect: Mood normal.     ?   Behavior: Behavior normal.  ? ? ? ?Lab Results: ?Lab Results  ?Component Value Date  ? WBC 16.4 (H) 11/17/2021  ? HGB 11.6 (L) 11/17/2021  ? HCT 34.7 (L) 11/17/2021  ? MCV 90.6 11/17/2021  ? PLT 412 (H) 11/17/2021  ?  ?Lab Results  ?Component Value Date  ? NA 134 (L) 11/16/2021  ? K 4.1 11/16/2021  ? CO2 29 11/16/2021  ? GLUCOSE 81 11/16/2021  ? BUN 9 11/16/2021  ? CREATININE 0.83 11/16/2021  ? CALCIUM 8.7 (L) 11/16/2021  ? GFRNONAA >60 11/16/2021  ?  ?Lab Results  ?Component Value Date  ? ALT 18 11/10/2021  ? AST 30 11/10/2021  ? ALKPHOS 78 11/10/2021  ? BILITOT 0.6 11/10/2021  ? ? ?   ?Component Value Date/Time  ? CRP 15.4 (H) 11/14/2021 1324  ? ? ?   ?Component Value Date/Time  ? ESRSEDRATE 90 (H) 11/14/2021 1324  ? ?  ?I have reviewed the micro and lab results in Epic. ? ?Imaging: ?DG Orthopantogram ? ?Result Date: 11/19/2021 ?CLINICAL DATA:  Tooth pain EXAM: ORTHOPANTOGRAM/PANORAMIC COMPARISON:  None. FINDINGS: Panorex view of the mandible was performed. Extremely poor dentition is noted. There are numerous missing teeth bilaterally. Marked erosive changes are seen within the residual upper and lower premolars and molars. Erosive changes are seen within the upper incisors. No acute bony abnormalities. Visualized sinuses are clear. IMPRESSION: 1. Extremely poor dentition, with numerous missing teeth bilaterally and marked erosive changes of the residual incisors, premolars, and molars. 2. No acute bony abnormality. Electronically Signed   By: Sharlet Salina M.D.   On: 11/19/2021 22:16  ? ?IR US CHEST ? ?Result Date: 11/19/2021 ?CLINICAL DATA:  31 year old male presenting for thoracentesis EXAM: CHEST ULTRASOUND COMPARISON:  Thoracic spine MRI from 11/15/2021 FINDINGS: Bilateral chest was evaluated with ultrasound which demonstrated no evidence of bilateral pleural effusion. IMPRESSION: No evidence  of pleural effusion bilaterally. No thoracentesis was performed. Marliss Coots, MD [facility] Electronically Signed   By: Marliss Coots M.D.   On: 11/19/2021 16:42    ? ?Imaging independently reviewed in Epic.  ? ? ?Kathlynn Grate ?[facility] ?[facility] ?[phone] pager ?11/20/2021, 8:19 AM ? ? ?

## 2021-11-20 NOTE — Progress Notes (Signed)
? ?Reginald Clarke  PYP:[id number] DOB: 1991/05/09 DOA: 11/14/2021 ?PCP: Pcp, No   ? ?Brief Narrative:  ?30yo with a history of IV drug abuse, GERD, chronic pain followed in the methadone clinic, and recent diagnosis of community-acquired pneumonia who returned to the ED when she was called due to a blood culture resulting positive for staph.  On her return she was still complaining of intermittent coughing spells and some pleuritic type chest pain.  CXR noted a right basilar infiltrate and a small right pleural effusion. ? ?Consultants:  ?TCTS ?ID ? ?Code Status: FULL CODE ? ?DVT prophylaxis: ?Lovenox ? ?Interim Hx: ?Vitals are stable.  Mild tachycardia.  Afebrile.  Awaiting TEE to determine if angio vac procedure possible.[facility] Cards consulted to arrange TEE.  No new complaints otherwise.  Reports that back pain is well controlled with Toradol. ? ?Assessment & Plan: ? ?Methicillin susceptible Staph bacteremia ?Antibiotics as directed by ID presently utilizing Ancef - MRI of lumbar and thoracic spine revealed no evidence of septic arthritis osteomyelitis or abscess - pulmonary imaging suggests possible septic emboli - TTE consistent with tricuspid valve endocarditis - patient transferred [facility] for TCTS eval and possible interventional procedure ? ?Tricuspid valve endocarditis ?Due to above - antibiotics per ID -being evaluated for candidacy for angio [facility] - TEE requested w/ [facility] Cards  ? ?Community-acquired pneumonia ?Diagnosed prior to this admission during an ED visit 11/10/21 -adequately covered with current antibiotic choice -was sent to IR by ID to consider thoracentesis but insufficient fluid was present ? ?Polysubstance abuse ?UDS positive for THC and amphetamines -avoid adding other narcotics to scheduled methadone ? ?Hepatitis C ?HCVRNA positive at 796,000 copies -care as per ID ? ?Chronic pain syndrome ?Actively followed in outpatient methadone clinic -continue usual methadone dose but  avoid use of other narcotics ? ?Tobacco abuse ?Has been counseled on the need to stop smoking ? ? ?Family Communication: No family present at time of exam today ?Disposition: From home -ultimate disposition will depend upon medical course ? ? ?Objective: ?Blood pressure 113/74, pulse 100, temperature 98.9 ?F (37.2 ?C), temperature source Oral, resp. rate 20, height 5\' 10"  (1.778 m), weight 70.2 kg, SpO2 94 %. ? ?Intake/Output Summary (Last 24 hours) at 11/20/2021 0853 ?Last data filed at 11/19/2021 1300 ?Gross per 24 hour  ?Intake --  ?Output 500 ml  ?Net -500 ml  ? ? ?Filed Weights  ? 11/14/21 1023 11/14/21 2000  ?Weight: 70.8 kg 70.2 kg  ? ? ?Examination: ?General: No acute respiratory distress -sleeping ?Lungs: No focal crackles or wheezes with good air movement bilaterally ?Cardiovascular: Regular rate and rhythm  ?Abdomen: NT/ND, soft, BS positive ?Extremities: No significant cyanosis, clubbing, or edema B LE ? ?CBC: ?Recent Labs  ?Lab 11/14/21 ?1235 11/15/21 ?0503 11/17/21 ?11/19/21  ?WBC 7.7 12.2* 16.4*  ?NEUTROABS 5.7  --   --   ?HGB 10.9* 10.5* 11.6*  ?HCT 33.8* 32.7* 34.7*  ?MCV 91.1 91.6 90.6  ?PLT 176 289 412*  ? ? ?Basic Metabolic Panel: ?Recent Labs  ?Lab 11/14/21 ?1235 11/15/21 ?0503 11/16/21 ?1430  ?NA 132* 135 134*  ?K 4.0 4.2 4.1  ?CL 99 100 98  ?CO2 27 28 29   ?GLUCOSE 85 103* 81  ?BUN 7 8 9   ?CREATININE 0.64 0.68 0.83  ?CALCIUM 8.0* 8.3* 8.7*  ? ? ?GFR: ?Estimated Creatinine Clearance: 129.2 mL/min (by C-G formula based on SCr of 0.83 mg/dL). ? ? ?Scheduled Meds: ? enoxaparin (LOVENOX) injection  40 mg Subcutaneous Q24H  ?  methadone  130 mg Oral Daily  ? And  ? methadone  5 mg Oral Daily  ? pantoprazole  40 mg Oral Daily  ? ?Continuous Infusions: ?  ceFAZolin (ANCEF) IV 2 g (11/20/21 0329)  ? ? ? LOS: 6 days  ? ?Lonia Blood, MD ?[facility] ?Office  [phone] ?Pager - Text Page per Loretha Stapler ? ?If 7PM-7AM, please contact night-coverage per Amion ?11/20/2021, 8:53 AM ? ? ? ? ?

## 2021-11-21 DIAGNOSIS — F1910 Other psychoactive substance abuse, uncomplicated: Secondary | ICD-10-CM | POA: Diagnosis not present

## 2021-11-21 DIAGNOSIS — R7881 Bacteremia: Secondary | ICD-10-CM | POA: Diagnosis not present

## 2021-11-21 DIAGNOSIS — J189 Pneumonia, unspecified organism: Secondary | ICD-10-CM | POA: Diagnosis not present

## 2021-11-21 DIAGNOSIS — I079 Rheumatic tricuspid valve disease, unspecified: Secondary | ICD-10-CM | POA: Diagnosis not present

## 2021-11-21 MED ORDER — HYDROXYZINE HCL 10 MG PO TABS
10.0000 mg | ORAL_TABLET | Freq: Three times a day (TID) | ORAL | Status: DC | PRN
  Administered 2021-11-21 – 2021-12-10 (×33): 10 mg via ORAL
  Filled 2021-11-21 (×41): qty 1

## 2021-11-21 NOTE — Progress Notes (Signed)
?PROGRESS NOTE ? ? ? ?Reginald Clarke  FGH:[id number] DOB: November 14, 1990 DOA: 11/14/2021 ?PCP: Pcp, No  ?30yo with a history of IV drug abuse, GERD, chronic pain followed in the methadone clinic, and recent diagnosis of community-acquired pneumonia who returned to the ED when she was called due to a blood culture resulting positive for staph.   ?-Admitted with MSSA bacteremia, on further work-up was found to have tricuspid valve endocarditis with severe TR and septic pulmonary emboli ? ?Subjective: ?-Feels okay today, no events overnight ? ?Assessment and Plan: ? ?Tricuspid valve endocarditis ?Methicillin susceptible Staph bacteremia ?-Repeat blood cultures from 4/12 are negative ?-TTE noted 1.4X 0.9 cm vegetation on tricuspid valve with moderate to severe TR ?-T CTS consulting ?-TEE pending, this was requested by Dr. Sharon Seller yesterday ?-Continue IV Ancef ?  ?Septic pulmonary emboli ?-Antibiotics as above ? ?Acute on chronic back pain ?-Imaging MRI thoracic and lumbar spine negative for discitis, osteomyelitis, abscess ?-Followed at a methadone clinic ? ?Chronic methadone dependence ?-Methadone continued ?  ?Polysubstance abuse ?UDS positive for THC and amphetamines -avoid adding other narcotics to scheduled methadone ?  ?Hepatitis C ?HCVRNA positive at 796,000 copies -care as per ID ?  ?Tobacco abuse ?Has been counseled on the need to stop smoking ?  ?DVT prophylaxis: Lovenox ?Code Status: Full code ?Family Communication: Discussed with patient in detail, no family at bedside ?Disposition Plan: Home pending completion of antibiotic therapy ? ?Consultants:  ?Infectious disease, T CTS ? ?Procedures:  ? ?Antimicrobials:  ? ? ?Objective: ?Vitals:  ? 11/20/21 2025 11/21/21 0019 11/21/21 6789 11/21/21 0727  ?BP: 101/67  (!) 93/57 111/76  ?Pulse: 88 100 88 92  ?Resp: 18 20 18 14   ?Temp: 98.5 ?F (36.9 ?C) 98.4 ?F (36.9 ?C) 98.1 ?F (36.7 ?C) 98.6 ?F (37 ?C)  ?TempSrc: Oral Oral Oral Oral  ?SpO2: 98% 98% 98% 100%  ?Weight:       ?Height:      ? ?No intake or output data in the 24 hours ending 11/21/21 1140 ?Filed Weights  ? 11/14/21 1023 11/14/21 2000  ?Weight: 70.8 kg 70.2 kg  ? ? ?Examination: ? ?General exam: Appears calm and comfortable  ?Respiratory system: Clear to auscultation ?Cardiovascular system: S1 & S2 heard, RRR.  Diastolic murmur ?Abd: nondistended, soft and nontender.Normal bowel sounds heard. ?Central nervous system: Alert and oriented. No focal neurological deficits. ?Extremities: no edema ?Skin: No rashes ?Psychiatry:  Mood & affect appropriate.  ? ? ? ?Data Reviewed:  ? ?CBC: ?Recent Labs  ?Lab 11/14/21 ?1235 11/15/21 ?0503 11/17/21 ?11/19/21  ?WBC 7.7 12.2* 16.4*  ?NEUTROABS 5.7  --   --   ?HGB 10.9* 10.5* 11.6*  ?HCT 33.8* 32.7* 34.7*  ?MCV 91.1 91.6 90.6  ?PLT 176 289 412*  ? ?Basic Metabolic Panel: ?Recent Labs  ?Lab 11/14/21 ?1235 11/15/21 ?0503 11/16/21 ?1430  ?NA 132* 135 134*  ?K 4.0 4.2 4.1  ?CL 99 100 98  ?CO2 27 28 29   ?GLUCOSE 85 103* 81  ?BUN 7 8 9   ?CREATININE 0.64 0.68 0.83  ?CALCIUM 8.0* 8.3* 8.7*  ? ?GFR: ?Estimated Creatinine Clearance: 129.2 mL/min (by C-G formula based on SCr of 0.83 mg/dL). ?Liver Function Tests: ?No results for input(s): AST, ALT, ALKPHOS, BILITOT, PROT, ALBUMIN in the last 168 hours. ?No results for input(s): LIPASE, AMYLASE in the last 168 hours. ?No results for input(s): AMMONIA in the last 168 hours. ?Coagulation Profile: ?No results for input(s): INR, PROTIME in the last 168 hours. ?Cardiac Enzymes: ?No results for input(s):  CKTOTAL, CKMB, CKMBINDEX, TROPONINI in the last 168 hours. ?BNP (last 3 results) ?No results for input(s): PROBNP in the last 8760 hours. ?HbA1C: ?No results for input(s): HGBA1C in the last 72 hours. ?CBG: ?No results for input(s): GLUCAP in the last 168 hours. ?Lipid Profile: ?No results for input(s): CHOL, HDL, LDLCALC, TRIG, CHOLHDL, LDLDIRECT in the last 72 hours. ?Thyroid Function Tests: ?No results for input(s): TSH, T4TOTAL, FREET4, T3FREE, THYROIDAB  in the last 72 hours. ?Anemia Panel: ?No results for input(s): VITAMINB12, FOLATE, FERRITIN, TIBC, IRON, RETICCTPCT in the last 72 hours. ?Urine analysis: ?   ?Component Value Date/Time  ? COLORURINE AMBER (A) 11/10/2021 2301  ? APPEARANCEUR HAZY (A) 11/10/2021 2301  ? LABSPEC 1.016 11/10/2021 2301  ? PHURINE 6.0 11/10/2021 2301  ? GLUCOSEU NEGATIVE 11/10/2021 2301  ? HGBUR SMALL (A) 11/10/2021 2301  ? BILIRUBINUR NEGATIVE 11/10/2021 2301  ? KETONESUR NEGATIVE 11/10/2021 2301  ? PROTEINUR 30 (A) 11/10/2021 2301  ? NITRITE NEGATIVE 11/10/2021 2301  ? LEUKOCYTESUR MODERATE (A) 11/10/2021 2301  ? ?Sepsis Labs: ?@LABRCNTIP (procalcitonin:4,lacticidven:4) ? ?) ?Recent Results (from the past 240 hour(s))  ?Blood culture (routine x 2)     Status: None  ? Collection Time: 11/14/21 12:35 PM  ? Specimen: BLOOD LEFT FOREARM  ?Result Value Ref Range Status  ? Specimen Description BLOOD LEFT FOREARM  Final  ? Special Requests   Final  ?  BOTTLES DRAWN AEROBIC AND ANAEROBIC Blood Culture adequate volume  ? Culture   Final  ?  NO GROWTH 6 DAYS ?Performed at [facility], [address], [place], [place] ?  ? Report Status 11/20/2021 FINAL  Final  ?Blood culture (routine x 2)     Status: None  ? Collection Time: 11/14/21 12:57 PM  ? Specimen: BLOOD LEFT ARM  ?Result Value Ref Range Status  ? Specimen Description BLOOD LEFT ARM  Final  ? Special Requests   Final  ?  BOTTLES DRAWN AEROBIC AND ANAEROBIC Blood Culture results may not be optimal due to an excessive volume of blood received in culture bottles  ? Culture   Final  ?  NO GROWTH 6 DAYS ?Performed at [facility], [address], [place], [place] ?  ? Report Status 11/20/2021 FINAL  Final  ?MRSA Next Gen by PCR, Nasal     Status: None  ? Collection Time: 11/15/21 10:48 AM  ? Specimen: Nasal Mucosa; Nasal Swab  ?Result Value Ref Range Status  ? MRSA by PCR Next Gen NOT DETECTED NOT DETECTED Final  ?  Comment: (NOTE) ?The GeneXpert MRSA Assay (FDA approved for  NASAL specimens only), ?is one component of a comprehensive MRSA colonization surveillance ?program. It is not intended to diagnose MRSA infection nor to guide ?or monitor treatment for MRSA infections. ?Test performance is not FDA approved in patients less than 2 years ?old. ?Performed at [facility], [address], [place], [place] ?  ?  ? ?Radiology Studies: ?DG Orthopantogram ? ?Result Date: 11/19/2021 ?CLINICAL DATA:  Tooth pain EXAM: ORTHOPANTOGRAM/PANORAMIC COMPARISON:  None. FINDINGS: Panorex view of the mandible was performed. Extremely poor dentition is noted. There are numerous missing teeth bilaterally. Marked erosive changes are seen within the residual upper and lower premolars and molars. Erosive changes are seen within the upper incisors. No acute bony abnormalities. Visualized sinuses are clear. IMPRESSION: 1. Extremely poor dentition, with numerous missing teeth bilaterally and marked erosive changes of the residual incisors, premolars, and molars. 2. No acute bony abnormality. Electronically Signed  By: Sharlet SalinaMichael  Brown M.D.   On: 11/19/2021 22:16  ? ?IR US CHEST ? ?Result Date: 11/19/2021 ?CLINICAL DATA:  31 year old male presenting for thoracentesis EXAM: CHEST ULTRASOUND COMPARISON:  Thoracic spine MRI from 11/15/2021 FINDINGS: Bilateral chest was evaluated with ultrasound which demonstrated no evidence of bilateral pleural effusion. IMPRESSION: No evidence of pleural effusion bilaterally. No thoracentesis was performed. Marliss Cootsylan Suttle, MD [facility] Electronically Signed   By: Marliss Cootsylan  Suttle M.D.   On: 11/19/2021 16:42   ? ? ?Scheduled Meds: ? enoxaparin (LOVENOX) injection  40 mg Subcutaneous Q24H  ? methadone  130 mg Oral Daily  ? And  ? methadone  5 mg Oral Daily  ? pantoprazole  40 mg Oral Daily  ? ?Continuous Infusions: ?  ceFAZolin (ANCEF) IV 2 g (11/21/21 1052)  ? ? ? LOS: 7 days  ? ? ?Time spent: 35min ? ? ?Zannie CovePreetha  Quoc Tome, MD ?[facility] ? ? ?11/21/2021, 11:40 AM  ?  ?

## 2021-11-21 NOTE — Progress Notes (Signed)
?   ? ?[place] [facility] ? ?Date of Admission:  11/14/2021    ?       ?Reason for visit: Follow up on MSSA bacteremia ?  ?Current antibiotics: ?Cefazolin ? ? ?ASSESSMENT:   ? ?31 y.o. male admitted with: ? ?MSSA bacteremia: Initial blood cultures positive from 4/9 and finalized as negative on 4/12. ?Tricuspid valve endocarditis: TTE 4/13 notable for a 1.4 x 0.9 cm vegetation.  Transferred to [facility] for CVTS evaluation, TEE, and possible angio vac procedure.  TEE is currently scheduled for Friday. ?Septic pulmonary emboli: Complicated by right pleural effusion.  Evaluated by IR for possible thoracentesis on 4/17, however, there was no pleural fluid to drain. ?Back pain: MRI thoracic and lumbar spine 4/13 notable for reactive edema at L4-5 and L5-S1 facets.  No convincing evidence of infection at this time but may require short interval follow-up depending on his clinical response. ?Sepsis due to #1 through #3.  Improved. ?Hepatitis C: HCV RNA positive at 796,000 copies. ?Substance use disorder: History of heroin use currently on methadone.  More recent history of methamphetamine use including intravenously leading to current infectious complications. ? ?RECOMMENDATIONS:   ? ?Continue cefazolin ?Await TEE and possible angio VAC ?Monitor back pain ?Hepatitis C treatment as an outpatient ?Will follow ? ? ?Principal Problem: ?  Bacteremia ?Active Problems: ?  Chronic pain syndrome ?  CAP (community acquired pneumonia) ?  GERD (gastroesophageal reflux disease) ?  Tobacco abuse ?  Endocarditis of tricuspid valve ?  Polysubstance abuse ([place]) ? ? ? ?MEDICATIONS:   ? ?Scheduled Meds: ? enoxaparin (LOVENOX) injection  40 mg Subcutaneous Q24H  ? methadone  130 mg Oral Daily  ? And  ? methadone  5 mg Oral Daily  ? pantoprazole  40 mg Oral Daily  ? ?Continuous Infusions: ?  ceFAZolin (ANCEF) IV 2 g (11/21/21 [id number])  ? ?PRN Meds:.acetaminophen **OR** [DISCONTINUED] acetaminophen, albuterol, ALPRAZolam, alum  & mag hydroxide-simeth, guaiFENesin-dextromethorphan, ketorolac, methocarbamol, ondansetron **OR** ondansetron (ZOFRAN) IV ? ?SUBJECTIVE:  ? ?24 hour events:  ?No acute events noted overnight ?TEE is pending ?No new labs ?No new micro ?No new imaging ?Afebrile, Tmax 98.6 ?Saturating 100% on room air ?Blood pressure stable, heart rate stable ? ?Patient reports his back pain as stable.  No fevers.  Tolerating antibiotics.  TEE is scheduled for Friday. ? ?Review of Systems  ?All other systems reviewed and are negative. ? ?  ?OBJECTIVE:  ? ?Blood pressure 111/76, pulse 92, temperature 98.6 ?F (37 ?C), temperature source Oral, resp. rate 14, height 5\' 10"  (1.778 m), weight 70.2 kg, SpO2 100 %. ?Body mass index is 22.21 kg/m?. ? ?Physical Exam ?Constitutional:   ?   General: He is not in acute distress. ?   Appearance: Normal appearance.  ?HENT:  ?   Mouth/Throat:  ?   Comments: Dentition is poor. ?Eyes:  ?   Extraocular Movements: Extraocular movements intact.  ?   Conjunctiva/sclera: Conjunctivae normal.  ?Cardiovascular:  ?   Rate and Rhythm: Normal rate and regular rhythm.  ?Pulmonary:  ?   Effort: Pulmonary effort is normal. No respiratory distress.  ?Abdominal:  ?   General: There is no distension.  ?   Palpations: Abdomen is soft.  ?   Tenderness: There is no abdominal tenderness.  ?Musculoskeletal:     ?   General: Normal range of motion.  ?   Cervical back: Normal range of motion and neck supple.  ?Skin: ?   General: Skin  is warm and dry.  ?   Findings: No rash.  ?Neurological:  ?   General: No focal deficit present.  ?   Mental Status: He is alert and oriented to person, place, and time. Mental status is at baseline.  ?Psychiatric:     ?   Mood and Affect: Mood normal.     ?   Behavior: Behavior normal.  ? ? ? ?Lab Results: ?Lab Results  ?Component Value Date  ? WBC 16.4 (H) 11/17/2021  ? HGB 11.6 (L) 11/17/2021  ? HCT 34.7 (L) 11/17/2021  ? MCV 90.6 11/17/2021  ? PLT 412 (H) 11/17/2021  ?  ?Lab Results   ?Component Value Date  ? NA 134 (L) 11/16/2021  ? K 4.1 11/16/2021  ? CO2 29 11/16/2021  ? GLUCOSE 81 11/16/2021  ? BUN 9 11/16/2021  ? CREATININE 0.83 11/16/2021  ? CALCIUM 8.7 (L) 11/16/2021  ? GFRNONAA >60 11/16/2021  ?  ?Lab Results  ?Component Value Date  ? ALT 18 11/10/2021  ? AST 30 11/10/2021  ? ALKPHOS 78 11/10/2021  ? BILITOT 0.6 11/10/2021  ? ? ?   ?Component Value Date/Time  ? CRP 15.4 (H) 11/14/2021 1324  ? ? ?   ?Component Value Date/Time  ? ESRSEDRATE 90 (H) 11/14/2021 1324  ? ?  ?I have reviewed the micro and lab results in Epic. ? ?Imaging: ?DG Orthopantogram ? ?Result Date: 11/19/2021 ?CLINICAL DATA:  Tooth pain EXAM: ORTHOPANTOGRAM/PANORAMIC COMPARISON:  None. FINDINGS: Panorex view of the mandible was performed. Extremely poor dentition is noted. There are numerous missing teeth bilaterally. Marked erosive changes are seen within the residual upper and lower premolars and molars. Erosive changes are seen within the upper incisors. No acute bony abnormalities. Visualized sinuses are clear. IMPRESSION: 1. Extremely poor dentition, with numerous missing teeth bilaterally and marked erosive changes of the residual incisors, premolars, and molars. 2. No acute bony abnormality. Electronically Signed   By: Randa Ngo M.D.   On: 11/19/2021 22:16  ? ?IR US CHEST ? ?Result Date: 11/19/2021 ?CLINICAL DATA:  31 year old male presenting for thoracentesis EXAM: CHEST ULTRASOUND COMPARISON:  Thoracic spine MRI from 11/15/2021 FINDINGS: Bilateral chest was evaluated with ultrasound which demonstrated no evidence of bilateral pleural effusion. IMPRESSION: No evidence of pleural effusion bilaterally. No thoracentesis was performed. Ruthann Cancer, MD [facility] Electronically Signed   By: Ruthann Cancer M.D.   On: 11/19/2021 16:42    ? ?Imaging independently reviewed in Epic.  ? ? ?Mignon Pine ?[place] [facility] ?[place] ?[phone] pager ?11/21/2021, 8:14 AM ? ? ?

## 2021-11-22 DIAGNOSIS — R7881 Bacteremia: Secondary | ICD-10-CM | POA: Diagnosis not present

## 2021-11-22 DIAGNOSIS — F1910 Other psychoactive substance abuse, uncomplicated: Secondary | ICD-10-CM | POA: Diagnosis not present

## 2021-11-22 DIAGNOSIS — I079 Rheumatic tricuspid valve disease, unspecified: Secondary | ICD-10-CM | POA: Diagnosis not present

## 2021-11-22 LAB — PROTIME-INR
INR: 1 (ref 0.8–1.2)
Prothrombin Time: 13.3 seconds (ref 11.4–15.2)

## 2021-11-22 NOTE — Progress Notes (Addendum)
? ?  Shared Decision Making/Informed Consent ? ?The risks [esophageal damage, perforation (1:10,000 risk), bleeding, pharyngeal hematoma as well as other potential complications associated with conscious sedation including aspiration, arrhythmia, respiratory failure and death], benefits (treatment guidance and diagnostic support) and alternatives of a transesophageal echocardiogram were discussed in detail with Mr. Reginald Clarke and he is willing to proceed.   ? ?NPO after midnight, TEE scheduled tomorrow morning on 11/23/21. Check INR. Note HCV +. Orders placed.  ?

## 2021-11-22 NOTE — H&P (View-Only) (Signed)
? ?  Shared Decision Making/Informed Consent ? ?The risks [esophageal damage, perforation (1:10,000 risk), bleeding, pharyngeal hematoma as well as other potential complications associated with conscious sedation including aspiration, arrhythmia, respiratory failure and death], benefits (treatment guidance and diagnostic support) and alternatives of a transesophageal echocardiogram were discussed in detail with Mr. Reginald Clarke and he is willing to proceed.   ? ?NPO after midnight, TEE scheduled tomorrow morning on 11/23/21. Check INR. Note HCV +. Orders placed.  ?

## 2021-11-22 NOTE — Progress Notes (Signed)
?   ? ?[facility] ? ?Date of Admission:  11/14/2021    ?       ?Reason for visit: Follow up on MSSA bacteremia ?  ?Current antibiotics: ?Cefazolin ? ?ASSESSMENT:   ? ?31 y.o. male admitted with: ? ?MSSA bacteremia: Initial blood cultures positive from 4/9 and finalized as negative on 4/12. ?Tricuspid valve endocarditis: TTE 4/13 notable for a 1.4 x 0.9 cm vegetation.  Transferred to [facility] for CVTS evaluation, TEE, and possible angio vac procedure.  TEE is currently scheduled for 4/21. ?Septic pulmonary emboli: Complicated by right pleural effusion.  Evaluated by IR for possible thoracentesis on 4/17, however, there was no pleural fluid to drain. ?Back pain: MRI thoracic and lumbar spine 4/13 notable for reactive edema at L4-5 and L5-S1 facets.  No convincing evidence of infection at this time but may require short interval follow-up depending on his clinical response. ?Sepsis due to #1 through #3.  Improved. ?Hepatitis C: HCV RNA positive at 796,000 copies. ?Substance use disorder: History of heroin use currently on methadone.  More recent history of methamphetamine use including intravenously leading to current infectious complications. ? ?RECOMMENDATIONS:   ? ?Continue cefazolin ?TEE tomorrow ?Hep C treatment outpatient ?Will follow ? ? ?Principal Problem: ?  Bacteremia ?Active Problems: ?  Chronic pain syndrome ?  CAP (community acquired pneumonia) ?  GERD (gastroesophageal reflux disease) ?  Tobacco abuse ?  Endocarditis of tricuspid valve ?  Polysubstance abuse ([name]) ? ? ? ?MEDICATIONS:   ? ?Scheduled Meds: ? enoxaparin (LOVENOX) injection  40 mg Subcutaneous Q24H  ? methadone  130 mg Oral Daily  ? And  ? methadone  5 mg Oral Daily  ? pantoprazole  40 mg Oral Daily  ? ?Continuous Infusions: ?  ceFAZolin (ANCEF) IV 2 g (11/22/21 0317)  ? ?PRN Meds:.acetaminophen **OR** [DISCONTINUED] acetaminophen, albuterol, ALPRAZolam, alum & mag hydroxide-simeth, guaiFENesin-dextromethorphan,  hydrOXYzine, ketorolac, methocarbamol, ondansetron **OR** ondansetron (ZOFRAN) IV ? ?SUBJECTIVE:  ? ?24 hour events:  ?No acute events noted ?TEE is planned for tomorrow ?No new labs ?No new imaging ?No new micro ? ?No new complaints.  ? ?Review of Systems  ?All other systems reviewed and are negative. ? ?  ?OBJECTIVE:  ? ?Blood pressure 119/69, pulse (!) 101, temperature 98.5 ?F (36.9 ?C), temperature source Oral, resp. rate 18, height 5\' 10"  (1.778 m), weight 70.2 kg, SpO2 98 %. ?Body mass index is 22.21 kg/m?. ? ?Physical Exam ?Constitutional:   ?   General: He is not in acute distress. ?   Appearance: Normal appearance.  ?HENT:  ?   Head: Normocephalic and atraumatic.  ?   Mouth/Throat:  ?   Comments: Poor dentition. ?Eyes:  ?   Extraocular Movements: Extraocular movements intact.  ?   Conjunctiva/sclera: Conjunctivae normal.  ?Pulmonary:  ?   Effort: Pulmonary effort is normal. No respiratory distress.  ?Abdominal:  ?   General: There is no distension.  ?   Palpations: Abdomen is soft.  ?   Tenderness: There is no abdominal tenderness.  ?Musculoskeletal:     ?   General: Normal range of motion.  ?   Cervical back: Normal range of motion and neck supple.  ?Skin: ?   General: Skin is warm and dry.  ?Neurological:  ?   General: No focal deficit present.  ?   Mental Status: He is alert and oriented to person, place, and time. Mental status is at baseline.  ?Psychiatric:     ?  Mood and Affect: Mood normal.     ?   Behavior: Behavior normal.  ? ? ? ?Lab Results: ?Lab Results  ?Component Value Date  ? WBC 16.4 (H) 11/17/2021  ? HGB 11.6 (L) 11/17/2021  ? HCT 34.7 (L) 11/17/2021  ? MCV 90.6 11/17/2021  ? PLT 412 (H) 11/17/2021  ?  ?Lab Results  ?Component Value Date  ? NA 134 (L) 11/16/2021  ? K 4.1 11/16/2021  ? CO2 29 11/16/2021  ? GLUCOSE 81 11/16/2021  ? BUN 9 11/16/2021  ? CREATININE 0.83 11/16/2021  ? CALCIUM 8.7 (L) 11/16/2021  ? GFRNONAA >60 11/16/2021  ?  ?Lab Results  ?Component Value Date  ? ALT 18  11/10/2021  ? AST 30 11/10/2021  ? ALKPHOS 78 11/10/2021  ? BILITOT 0.6 11/10/2021  ? ? ?   ?Component Value Date/Time  ? CRP 15.4 (H) 11/14/2021 1324  ? ? ?   ?Component Value Date/Time  ? ESRSEDRATE 90 (H) 11/14/2021 1324  ? ?  ?I have reviewed the micro and lab results in Epic. ? ?Imaging: ?No results found.  ? ?Imaging independently reviewed in Epic.  ? ? ?Kathlynn Grate ?[facility] ?[facility] ?[phone] pager ?11/22/2021, 8:09 AM ? ? ?

## 2021-11-22 NOTE — Progress Notes (Signed)
Patient seen and examined, no changes from my note yesterday ?-Discussed with cardiology, plan is for TEE tomorrow ? ?Zannie Cove, MD ?

## 2021-11-22 NOTE — Anesthesia Preprocedure Evaluation (Addendum)
Anesthesia Evaluation  ?Patient identified by MRN, date of birth, ID band ?Patient awake ? ? ? ?Reviewed: ?Allergy & Precautions, NPO status , Patient's Chart, lab work & pertinent test results ? ?Airway ?Mallampati: II ? ?TM Distance: >3 FB ?Neck ROM: Full ? ? ? Dental ? ?(+) Poor Dentition, Chipped ?  ?Pulmonary ?neg pulmonary ROS, Current Smoker and Patient abstained from smoking.,  ?  ?Pulmonary exam normal ? ? ? ? ? ? ? Cardiovascular ?negative cardio ROS ? ? ?Rhythm:Regular Rate:Normal ? ? ?  ?Neuro/Psych ?negative neurological ROS ? negative psych ROS  ? GI/Hepatic ?GERD  ,(+)  ?  ? substance abuse ? IV drug use,   ?Endo/Other  ?negative endocrine ROS ? Renal/GU ?negative Renal ROS  ?negative genitourinary ?  ?Musculoskeletal ?negative musculoskeletal ROS ?(+)  ? Abdominal ?Normal abdominal exam  (+)   ?Peds ? Hematology ?bacteremia   ?Anesthesia Other Findings ? ? Reproductive/Obstetrics ? ?  ? ? ? ? ? ? ? ? ? ? ? ? ? ?  ?  ? ? ? ? ? ? ? ?Anesthesia Physical ?Anesthesia Plan ? ?ASA: 3 ? ?Anesthesia Plan: MAC  ? ?Post-op Pain Management:   ? ?Induction: Intravenous ? ?PONV Risk Score and Plan: Propofol infusion and Treatment may vary due to age or medical condition ? ?Airway Management Planned: Simple Face Mask, Natural Airway and Nasal Cannula ? ?Additional Equipment:  ? ?Intra-op Plan:  ? ?Post-operative Plan:  ? ?Informed Consent: I have reviewed the patients History and Physical, chart, labs and discussed the procedure including the risks, benefits and alternatives for the proposed anesthesia with the patient or authorized representative who has indicated his/her understanding and acceptance.  ? ? ? ?Dental advisory given ? ?Plan Discussed with: CRNA ? ?Anesthesia Plan Comments: (ECHO 04/23 ??1. There is are multiple, highly mobile vegetations visualized on the  ?tricuspid valve leaflets with largest measuring 1.4cm x 0.9cm. Difficult  ?to quantify degree of tricuspid  valve regurgitation, however, suspect it  ?is at least moderate. The tricuspid  ?valve is abnormal. Tricuspid valve regurgitation is severe.  ??2. Left ventricular ejection fraction, by estimation, is 60 to 65%. The  ?left ventricle has normal function. The left ventricle has no regional  ?wall motion abnormalities. Indeterminate diastolic filling due to E-A  ?fusion.  ??3. Right ventricular systolic function is normal. The right ventricular  ?size is normal. There is normal pulmonary artery systolic pressure.  ??4. The mitral valve is normal in structure. Trivial mitral valve  ?regurgitation.  ??5. The aortic valve was not well visualized. Aortic valve regurgitation  ?is trivial. No aortic stenosis is present.  ??6. The inferior vena cava is normal in size with greater than 50%  ?respiratory variability, suggesting right atrial pressure of 3 mmHg.  ??7. 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 )  ? ? ? ? ? ?Anesthesia Quick Evaluation ? ?

## 2021-11-23 ENCOUNTER — Encounter (HOSPITAL_COMMUNITY)
Admission: EM | Disposition: A | Discharge status: Home / Self Care | Payer: Self-pay | Source: Home / Self Care | Attending: Family Medicine

## 2021-11-23 ENCOUNTER — Inpatient Hospital Stay (HOSPITAL_COMMUNITY): Payer: BLUE CROSS/BLUE SHIELD | Admitting: Anesthesiology

## 2021-11-23 ENCOUNTER — Inpatient Hospital Stay (HOSPITAL_COMMUNITY): Payer: BLUE CROSS/BLUE SHIELD

## 2021-11-23 ENCOUNTER — Encounter (HOSPITAL_COMMUNITY): Payer: Self-pay | Admitting: Internal Medicine

## 2021-11-23 DIAGNOSIS — A4102 Sepsis due to Methicillin resistant Staphylococcus aureus: Secondary | ICD-10-CM | POA: Diagnosis not present

## 2021-11-23 DIAGNOSIS — I079 Rheumatic tricuspid valve disease, unspecified: Secondary | ICD-10-CM | POA: Diagnosis not present

## 2021-11-23 DIAGNOSIS — F1910 Other psychoactive substance abuse, uncomplicated: Secondary | ICD-10-CM | POA: Diagnosis not present

## 2021-11-23 DIAGNOSIS — I361 Nonrheumatic tricuspid (valve) insufficiency: Secondary | ICD-10-CM | POA: Diagnosis not present

## 2021-11-23 DIAGNOSIS — R7881 Bacteremia: Secondary | ICD-10-CM | POA: Diagnosis not present

## 2021-11-23 HISTORY — PX: TEE WITHOUT CARDIOVERSION: SHX5443

## 2021-11-23 LAB — BASIC METABOLIC PANEL
Anion gap: 6 (ref 5–15)
BUN: 6 mg/dL (ref 6–20)
CO2: 30 mmol/L (ref 22–32)
Calcium: 8.3 mg/dL — ABNORMAL LOW (ref 8.9–10.3)
Chloride: 101 mmol/L (ref 98–111)
Creatinine, Ser: 0.69 mg/dL (ref 0.61–1.24)
GFR, Estimated: >60 mL/min (ref >60)
Glucose, Bld: 80 mg/dL (ref 70–99)
Potassium: 4.9 mmol/L (ref 3.5–5.1)
Sodium: 137 mmol/L (ref 135–145)

## 2021-11-23 LAB — CBC
HCT: 28.6 % — ABNORMAL LOW (ref 39.0–52.0)
Hemoglobin: 9.5 g/dL — ABNORMAL LOW (ref 13.0–17.0)
MCH: 30.5 pg (ref 26.0–34.0)
MCHC: 33.2 g/dL (ref 30.0–36.0)
MCV: 92 fL (ref 80.0–100.0)
Platelets: 223 10*3/uL (ref 150–400)
RBC: 3.11 MIL/uL — ABNORMAL LOW (ref 4.22–5.81)
RDW: 13.3 % (ref 11.5–15.5)
WBC: 4.8 10*3/uL (ref 4.0–10.5)
nRBC: 0 % (ref 0.0–0.2)

## 2021-11-23 SURGERY — ECHOCARDIOGRAM, TRANSESOPHAGEAL
Anesthesia: Monitor Anesthesia Care

## 2021-11-23 MED ORDER — CEFAZOLIN SODIUM-DEXTROSE 2-4 GM/100ML-% IV SOLN
2.0000 g | Freq: Three times a day (TID) | INTRAVENOUS | Status: AC
  Administered 2021-11-24 – 2021-12-12 (×55): 2 g via INTRAVENOUS
  Filled 2021-11-23 (×54): qty 100

## 2021-11-23 MED ORDER — PROPOFOL 10 MG/ML IV BOLUS
INTRAVENOUS | Status: DC | PRN
  Administered 2021-11-23: 10 mg via INTRAVENOUS
  Administered 2021-11-23: 30 mg via INTRAVENOUS
  Administered 2021-11-23: 60 mg via INTRAVENOUS

## 2021-11-23 MED ORDER — PROPOFOL 500 MG/50ML IV EMUL
INTRAVENOUS | Status: DC | PRN
  Administered 2021-11-23: 125 ug/kg/min via INTRAVENOUS

## 2021-11-23 MED ORDER — ENOXAPARIN SODIUM 40 MG/0.4ML IJ SOSY
40.0000 mg | PREFILLED_SYRINGE | INTRAMUSCULAR | Status: DC
  Administered 2021-11-24 – 2021-11-27 (×4): 40 mg via SUBCUTANEOUS
  Filled 2021-11-23 (×4): qty 0.4

## 2021-11-23 MED ORDER — SODIUM CHLORIDE 0.9 % IV SOLN: INTRAVENOUS | Status: DC

## 2021-11-23 MED ORDER — PHENYLEPHRINE 80 MCG/ML (10ML) SYRINGE FOR IV PUSH (FOR BLOOD PRESSURE SUPPORT)
PREFILLED_SYRINGE | INTRAVENOUS | Status: DC | PRN
  Administered 2021-11-23 (×2): 160 ug via INTRAVENOUS
  Administered 2021-11-23 (×2): 80 ug via INTRAVENOUS

## 2021-11-23 NOTE — Progress Notes (Signed)
?PROGRESS NOTE ? ? ? 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 ?Subjective: ?-Just back from TEE ? ?Assessment and Plan: ? ?Tricuspid valve endocarditis ?Methicillin susceptible Staph bacteremia ?-Repeat blood cultures from 4/12 are negative ?-TTE noted 1.4X 0.9 cm vegetation on tricuspid valve with moderate to severe TR ?-T CTS consulting ?-TEE completed this morning noted multiple large vegetations on tricuspid valve with severe TR ?-Continue IV Ancef per infectious disease recommendations ?  ?Septic pulmonary emboli ?-Antibiotics as above ? ?Acute on chronic back pain ?Chronic methadone dependence ?-Imaging MRI thoracic and lumbar spine negative for discitis, osteomyelitis, abscess ?-Methadone continued, followed at a methadone clinic ?  ?Polysubstance abuse ?UDS positive for THC and amphetamines  ?  ?Hepatitis C ?HCVRNA positive at 796,000 copies -care as per ID ?  ?Tobacco abuse ?-Counseled ?  ?DVT prophylaxis: Lovenox ?Code Status: Full code ?Family Communication: Discussed with patient in detail, no family at bedside ?Disposition Plan: Home pending completion of antibiotic therapy ? ?Consultants:  ?Infectious disease, T CTS ? ?Procedures: TEE: 4/21, multiple large vegetations on the tricuspid valve with severe tricuspid regurgitation ? ?Antimicrobials:  ? ? ?Objective: ?Vitals:  ? 11/23/21 0922 11/23/21 0944 11/23/21 1004 11/23/21 1103  ?BP: 105/62 (!) 103/59  110/63  ?Pulse: 88 (!) 102  (!) 101  ?Resp: 16   17  ?Temp:  98.2 ?F (36.8 ?C)   98.5 ?F (36.9 ?C)  ?TempSrc:    Oral  ?SpO2: 97% 100% 100% 97%  ?Weight:      ?Height:      ? ? ?Intake/Output Summary (Last 24 hours) at 11/23/2021 1147 ?Last data filed at 11/23/2021 6384 ?Gross per 24 hour  ?Intake 450 ml  ?Output --  ?Net 450 ml  ? ?Filed Weights  ? 11/14/21 1023 11/14/21 2000  ?Weight: 70.8 kg 70.2 kg  ? ? ?Examination: ? ?General exam: Somnolent, easily arousable, no distress ?CVS: S1-S2, regular rhythm diastolic murmur ?Lungs: Clear bilaterally ?Abdomen: Soft, nontender, bowel sounds present ?Extremities: No edema ?Skin: No rashes ?Psychiatry:  Mood & affect appropriate.  ? ? ? ?Data Reviewed:  ? ?CBC: ?Recent Labs  ?Lab 11/17/21 ?6659 11/23/21 ?0151  ?WBC 16.4* 4.8  ?HGB 11.6* 9.5*  ?HCT 34.7* 28.6*  ?MCV 90.6 92.0  ?PLT 412* 223  ? ?Basic Metabolic Panel: ?Recent Labs  ?Lab 11/16/21 ?1430 11/23/21 ?0151  ?NA 134* 137  ?K 4.1 4.9  ?CL 98 101  ?CO2 29 30  ?GLUCOSE 81 80  ?BUN 9 6  ?CREATININE 0.83 0.69  ?CALCIUM 8.7* 8.3*  ? ?GFR: ?Estimated Creatinine Clearance: 134.1 mL/min (by C-G formula based on SCr of 0.69 mg/dL). ?Liver Function Tests: ?No results for input(s): AST, ALT, ALKPHOS, BILITOT, PROT, ALBUMIN in the last 168 hours. ?No results for input(s): LIPASE, AMYLASE in the last 168 hours. ?No results for input(s): AMMONIA in the last 168 hours. ?Coagulation Profile: ?Recent Labs  ?Lab 11/22/21 ?1443  ?INR 1.0  ? ?Cardiac Enzymes: ?No results for input(s): CKTOTAL, CKMB, CKMBINDEX, TROPONINI in the last 168 hours. ?BNP (last 3 results) ?No results for input(s):  PROBNP in the last 8760 hours. ?HbA1C: ?No results for input(s): HGBA1C in the last 72 hours. ?CBG: ?No results for input(s): GLUCAP in the last 168 hours. ?Lipid Profile: ?No results for input(s): CHOL, HDL, LDLCALC, TRIG, CHOLHDL, LDLDIRECT in the last 72 hours. ?Thyroid Function Tests: ?No results for input(s): TSH, T4TOTAL, FREET4, T3FREE, THYROIDAB in the last 72 hours. ?Anemia Panel: ?No results for  input(s): VITAMINB12, FOLATE, FERRITIN, TIBC, IRON, RETICCTPCT in the last 72 hours. ?Urine analysis: ?   ?Component Value Date/Time  ? COLORURINE AMBER (A) 11/10/2021 2301  ? APPEARANCEUR HAZY (A) 11/10/2021 2301  ? LABSPEC 1.016 11/10/2021 2301  ? PHURINE 6.0 11/10/2021 2301  ? GLUCOSEU NEGATIVE 11/10/2021 2301  ? HGBUR SMALL (A) 11/10/2021 2301  ? BILIRUBINUR NEGATIVE 11/10/2021 2301  ? KETONESUR NEGATIVE 11/10/2021 2301  ? PROTEINUR 30 (A) 11/10/2021 2301  ? NITRITE NEGATIVE 11/10/2021 2301  ? LEUKOCYTESUR MODERATE (A) 11/10/2021 2301  ? ?Sepsis Labs: ?@LABRCNTIP (procalcitonin:4,lacticidven:4) ? ?) ?Recent Results (from the past 240 hour(s))  ?Blood culture (routine x 2)     Status: None  ? Collection Time: 11/14/21 12:35 PM  ? Specimen: BLOOD LEFT FOREARM  ?Result Value Ref Range Status  ? Specimen Description BLOOD LEFT FOREARM  Final  ? Special Requests   Final  ?  BOTTLES DRAWN AEROBIC AND ANAEROBIC Blood Culture adequate volume  ? Culture   Final  ?  NO GROWTH 6 DAYS ?Performed at [facility], [address], [place], [place] ?  ? Report Status 11/20/2021 FINAL  Final  ?Blood culture (routine x 2)     Status: None  ? Collection Time: 11/14/21 12:57 PM  ? Specimen: BLOOD LEFT ARM  ?Result Value Ref Range Status  ? Specimen Description BLOOD LEFT ARM  Final  ? Special Requests   Final  ?  BOTTLES DRAWN AEROBIC AND ANAEROBIC Blood Culture results may not be optimal due to an excessive volume of blood received in culture bottles  ? Culture   Final  ?  NO GROWTH 6 DAYS ?Performed at [facility], [address], [place], [place] ?  ? Report Status 11/20/2021 FINAL  Final  ?MRSA Next Gen by PCR, Nasal     Status: None  ? Collection Time: 11/15/21 10:48 AM  ? Specimen: Nasal Mucosa; Nasal Swab  ?Result Value Ref Range Status  ? MRSA by PCR Next Gen NOT DETECTED NOT DETECTED Final  ?  Comment: (NOTE) ?The GeneXpert MRSA Assay (FDA approved for NASAL specimens only), ?is one component of a  comprehensive MRSA colonization surveillance ?program. It is not intended to diagnose MRSA infection nor to guide ?or monitor treatment for MRSA infections. ?Test performance is not FDA approved in patients less than 2 years ?old. ?Performed at [facility], [address], [place], [place] ?  ?  ? ?Radiology Studies: ?No results found. ? ? ?Scheduled Meds: ? enoxaparin (LOVENOX) injection  40 mg Subcutaneous Q24H  ? methadone  130 mg Oral Daily  ? And  ? methadone  5 mg Oral Daily  ? pantoprazole  40 mg Oral Daily  ? ?Continuous Infusions: ?  ceFAZolin (ANCEF) IV 2 g (11/23/21 1102)  ? ? ? LOS: 9 days  ? ? ?Time spent: 11/25/21 ? ? ? , MD ?[facility] ? ? ?11/23/2021, 11:47 AM  ?  ?

## 2021-11-23 NOTE — Anesthesia Postprocedure Evaluation (Signed)
Anesthesia Post Note ? ?Patient: Reginald Clarke ? ?Procedure(s) Performed: TRANSESOPHAGEAL ECHOCARDIOGRAM (TEE) ? ?  ? ?Patient location during evaluation: Endoscopy ?Anesthesia Type: MAC ?Level of consciousness: awake and alert ?Pain management: pain level controlled ?Vital Signs Assessment: post-procedure vital signs reviewed and stable ?Respiratory status: spontaneous breathing, nonlabored ventilation, respiratory function stable and patient connected to nasal cannula oxygen ?Cardiovascular status: stable and blood pressure returned to baseline ?Postop Assessment: no apparent nausea or vomiting ?Anesthetic complications: no ? ? ?No notable events documented. ? ?Last Vitals:  ?Vitals:  ? 11/23/21 1004 11/23/21 1103  ?BP:  110/63  ?Pulse:  (!) 101  ?Resp:  17  ?Temp:  36.9 ?C  ?SpO2: 100% 97%  ?  ?Last Pain:  ?Vitals:  ? 11/23/21 1103  ?TempSrc: Oral  ?PainSc: 7   ? ? ?  ?  ?  ?  ?  ?  ? ?Reginald Clarke ? ? ? ? ?

## 2021-11-23 NOTE — Transfer of Care (Signed)
Immediate Anesthesia Transfer of Care Note ? ?Patient: Martinique Stennis ? ?Procedure(s) Performed: TRANSESOPHAGEAL ECHOCARDIOGRAM (TEE) ? ?Patient Location: PACU ? ?Anesthesia Type:MAC ? ?Level of Consciousness: drowsy ? ?Airway & Oxygen Therapy: Patient Spontanous Breathing ? ?Post-op Assessment: Report given to RN and Post -op Vital signs reviewed and stable ? ?Post vital signs: Reviewed and stable ? ?Last Vitals:  ?Vitals Value Taken Time  ?BP    ?Temp    ?Pulse    ?Resp    ?SpO2    ? ? ?Last Pain:  ?Vitals:  ? 11/23/21 0717  ?TempSrc: Temporal  ?PainSc: 3   ?   ? ?Patients Stated Pain Goal: 0 (11/23/21 0040) ? ?Complications: No notable events documented. ?

## 2021-11-23 NOTE — Progress Notes (Signed)
Staff has noted today that significant other and pt were in the bathroom together with the door closed earlier today.  Also, upon entering room for pt care, tech noticed s.o. jumped out of the bed and ran to the bathroom. ? ?I have made patient and significant other aware of our unit expectations that, for safety concerns, only the patient may be in the bed and they are not permitted to be alone in the bathroom together.  I advised them that it is not our desire to limit visitation, but if they do not comply, no visitors will be allowed. ?

## 2021-11-23 NOTE — CV Procedure (Signed)
? ? ?  TRANSESOPHAGEAL ECHOCARDIOGRAM  ? ?NAME:  Reginald Clarke    ?MRN: [id number] ?DOB:  1991-06-25    ?ADMIT DATE: 11/14/2021 ? ?INDICATIONS: ?Endocarditis ? ?PROCEDURE:  ? ?Informed consent was obtained prior to the procedure. The risks, benefits and alternatives for the procedure were discussed and the patient comprehended these risks.  Risks include, but are not limited to, cough, sore throat, vomiting, nausea, somnolence, esophageal and stomach trauma or perforation, bleeding, low blood pressure, aspiration, pneumonia, infection, trauma to the teeth and death.   ? ?Procedural time out performed. The oropharynx was anesthetized with topical 1% benzocaine.   ? ?Anesthesia was administered by Dr. Nance Pew and team.  The patient was administered a total of Propofol 225 mg,to achieve and maintain moderate to deep conscious sedation.  The patient's heart rate, blood pressure, and oxygen saturation are monitored continuously during the procedure. The period of conscious sedation is 18 minutes, of which I was present face-to-face 100% of this time.  ? ?The transesophageal probe was inserted in the esophagus and stomach without difficulty and multiple views were obtained.  ? ?COMPLICATIONS:   ? ?There were no immediate complications. ? ?KEY FINDINGS: ? ?Severe tricuspid regurgitation related to large infective and multiple vegetations. ?Full report to follow. ?Further management per primary team.  ? ?Riley Lam, MD ?[place]  [facility]  ?8:36 AM ? ? ?

## 2021-11-23 NOTE — Progress Notes (Signed)
?  Echocardiogram ?Echocardiogram Transesophageal has been performed. ? ?Reginald Clarke ?11/23/2021, 9:04 AM ?

## 2021-11-23 NOTE — Progress Notes (Signed)
?   ?  [address] ?      [name] [address] ?            [phone]      ? ?TEE reviewed ?OR next wed for angiovac debridement ? ?Corliss Skains ? ?

## 2021-11-23 NOTE — Interval H&P Note (Signed)
History and Physical Interval Note: ? ?11/23/2021 ?8:02 AM ? ?[name]  has presented today for surgery, with the diagnosis of BACTEREMIA.  The various methods of treatment have been discussed with the patient and family. After consideration of risks, benefits and other options for treatment, the patient has consented to  Procedure(s): ?TRANSESOPHAGEAL ECHOCARDIOGRAM (TEE) (N/A) as a surgical intervention.  The patient's history has been reviewed, patient examined, no change in status, stable for surgery.  I have reviewed the patient's chart and labs.  Questions were answered to the patient's satisfaction.   ? ? ?Reginald Clarke A Modesty Rudy ? ? ?

## 2021-11-23 NOTE — Progress Notes (Signed)
?   ? ?[facility] ? ?Date of Admission:  11/14/2021    ?       ?Reason for visit: Follow up on MSSA bacteremia ? ?Current antibiotics: ?Cefazolin ? ?ASSESSMENT:   ? ?31 y.o. male admitted with: ? ?MSSA bacteremia: Initial blood cultures positive from 4/9 and finalized as negative on 4/12. ?Tricuspid valve endocarditis: TTE 4/13 notable for a 1.4 x 0.9 cm vegetation.  Transferred to [facility] for CVTS evaluation, TEE, and possible angio vac procedure.  TEE 4/21 notable for severe TR and multiple vegetations.  Awaiting further CVTS recommendations. ?Septic pulmonary emboli: Complicated by right pleural effusion.  Evaluated by IR for possible thoracentesis on 4/17, however, there was no pleural fluid to drain. ?Back pain: MRI thoracic and lumbar spine 4/13 notable for reactive edema at L4-5 and L5-S1 facets.  No convincing evidence of infection at this time but may require short interval follow-up depending on his clinical response.  This has been stable.  ?Sepsis due to #1 through #3.  Improved. ?Hepatitis C: HCV RNA positive at 796,000 copies. ?Substance use disorder: History of heroin use currently on methadone.  More recent history of methamphetamine use including intravenously leading to current infectious complications. ? ?RECOMMENDATIONS:   ? ?Continue cefazolin ?Await CVTS recs ?Hep C treatment outpatient ?Dr Daiva Eves here over the weekend.  Otherwise, new ID team on Monday. ? ? ?Principal Problem: ?  Bacteremia ?Active Problems: ?  Chronic pain syndrome ?  CAP (community acquired pneumonia) ?  GERD (gastroesophageal reflux disease) ?  Tobacco abuse ?  Endocarditis of tricuspid valve ?  Polysubstance abuse ([name]) ? ? ? ?MEDICATIONS:   ? ?Scheduled Meds: ? enoxaparin (LOVENOX) injection  40 mg Subcutaneous Q24H  ? methadone  130 mg Oral Daily  ? And  ? methadone  5 mg Oral Daily  ? pantoprazole  40 mg Oral Daily  ? ?Continuous Infusions: ?  ceFAZolin (ANCEF) IV 2 g (11/23/21 1102)   ? ?PRN Meds:.acetaminophen **OR** [DISCONTINUED] acetaminophen, albuterol, ALPRAZolam, alum & mag hydroxide-simeth, guaiFENesin-dextromethorphan, hydrOXYzine, ketorolac, methocarbamol, ondansetron **OR** ondansetron (ZOFRAN) IV ? ?SUBJECTIVE:  ? ?24 hour events:  ?TEE today ? ?No new complaints.  Back from TEE and eating breakfast.  Grandfather and son are at the bedside. ? ?Review of Systems  ?All other systems reviewed and are negative. ? ?  ?OBJECTIVE:  ? ?Blood pressure 108/66, pulse 95, temperature 98.1 ?F (36.7 ?C), temperature source Oral, resp. rate 18, height 5\' 10"  (1.778 m), weight 70.2 kg, SpO2 98 %. ?Body mass index is 22.21 kg/m?. ? ?Physical Exam ?Constitutional:   ?   General: He is not in acute distress. ?   Appearance: Normal appearance.  ?HENT:  ?   Head: Normocephalic and atraumatic.  ?   Mouth/Throat:  ?   Comments: Poor dentition ?Eyes:  ?   Extraocular Movements: Extraocular movements intact.  ?   Conjunctiva/sclera: Conjunctivae normal.  ?Pulmonary:  ?   Effort: Pulmonary effort is normal. No respiratory distress.  ?Abdominal:  ?   General: There is no distension.  ?   Palpations: Abdomen is soft.  ?Musculoskeletal:     ?   General: Normal range of motion.  ?   Cervical back: Normal range of motion and neck supple.  ?Skin: ?   General: Skin is warm and dry.  ?Neurological:  ?   General: No focal deficit present.  ?   Mental Status: He is alert and oriented to person, place, and  time.  ?Psychiatric:     ?   Mood and Affect: Mood normal.     ?   Behavior: Behavior normal.  ? ? ? ?Lab Results: ?Lab Results  ?Component Value Date  ? WBC 4.8 11/23/2021  ? HGB 9.5 (L) 11/23/2021  ? HCT 28.6 (L) 11/23/2021  ? MCV 92.0 11/23/2021  ? PLT 223 11/23/2021  ?  ?Lab Results  ?Component Value Date  ? NA 137 11/23/2021  ? K 4.9 11/23/2021  ? CO2 30 11/23/2021  ? GLUCOSE 80 11/23/2021  ? BUN 6 11/23/2021  ? CREATININE 0.69 11/23/2021  ? CALCIUM 8.3 (L) 11/23/2021  ? GFRNONAA >60 11/23/2021  ?  ?Lab Results   ?Component Value Date  ? ALT 18 11/10/2021  ? AST 30 11/10/2021  ? ALKPHOS 78 11/10/2021  ? BILITOT 0.6 11/10/2021  ? ? ?   ?Component Value Date/Time  ? CRP 15.4 (H) 11/14/2021 1324  ? ? ?   ?Component Value Date/Time  ? ESRSEDRATE 90 (H) 11/14/2021 1324  ? ?  ?I have reviewed the micro and lab results in Epic. ? ?Imaging: ?No results found.  ? ?Imaging independently reviewed in Epic.  ? ? ?Kathlynn Grate ?[facility] ?[facility] ?[phone] pager ?11/23/2021, 12:53 PM ? ? ?

## 2021-11-24 DIAGNOSIS — R7881 Bacteremia: Secondary | ICD-10-CM | POA: Diagnosis not present

## 2021-11-24 DIAGNOSIS — B182 Chronic viral hepatitis C: Secondary | ICD-10-CM

## 2021-11-24 HISTORY — DX: Chronic viral hepatitis C: B18.2

## 2021-11-24 NOTE — Progress Notes (Signed)
?PROGRESS NOTE ? ? ? 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 ?Subjective: ?Feeling well, no pain, tolerating diet ? ?Assessment and Plan: ? ?Tricuspid valve endocarditis ?Methicillin susceptible Staph bacteremia ?-Repeat blood cultures from 4/12 are negative ?-TTE noted 1.4X 0.9 cm vegetation on tricuspid valve with moderate to severe TR ?-T CTS consulting ?-TEE completed this morning noted multiple large vegetations on tricuspid valve with severe TR ?-Continue IV Ancef per infectious disease recommendations ?- plan for OR angiovac procedure Wednesday w/ CT surgery ?  ?Septic pulmonary emboli ?-Antibiotics as above ? ?Acute on chronic back pain ?Chronic methadone dependence ?-Imaging MRI thoracic and lumbar spine negative for discitis, osteomyelitis, abscess ?-Methadone continued, followed at a methadone clinic ?  ?Polysubstance abuse ?UDS positive for THC and amphetamines  ?  ?Hepatitis C ?HCVRNA positive at 796,000 copies  ?- outpt ID f/u ?  ?Tobacco abuse ?-Counseled ?  ?DVT prophylaxis: Lovenox ?Code Status: Full code ?Family Communication: Discussed with patient in detail, no family at bedside ?Disposition Plan: Home pending completion of antibiotic therapy ? ?Consultants:  ?Infectious disease, T CTS ? ?Procedures: TEE: 4/21, multiple large vegetations on the tricuspid valve with severe tricuspid regurgitation ? ?Antimicrobials:  ? ? ?Objective: ?Vitals:  ? 11/24/21 0014 11/24/21 0023 11/24/21 0450 11/24/21 0841  ?BP: (!)  97/58 105/67 123/74 112/68  ?Pulse: (!) 102 100 (!) 104 (!) 103  ?Resp: 16 16 16 16   ?Temp: 99 ?F (37.2 ?C) 99 ?F (37.2 ?C) 98.6 ?F (37 ?C) 97.9 ?F (36.6 ?C)  ?TempSrc: Oral Oral Oral Oral  ?SpO2: 100% 100% 96% 96%  ?Weight:      ?Height:      ? ? ?Intake/Output Summary (Last 24 hours) at 11/24/2021 1117 ?Last data filed at 11/23/2021 1930 ?Gross per 24 hour  ?Intake 120 ml  ?Output --  ?Net 120 ml  ? ?Filed Weights  ? 11/14/21 1023 11/14/21 2000  ?Weight: 70.8 kg 70.2 kg  ? ? ?Examination: ? ?General exam: Somnolent, easily arousable, no distress ?CVS: S1-S2, regular rhythm sys/dys murmur ?Lungs: Clear bilaterally ?Abdomen: Soft, nontender, bowel sounds present ?Extremities: No edema ?Skin: No rashes ?Psychiatry:  Mood & affect appropriate.  ? ? ? ?Data Reviewed:  ? ?CBC: ?Recent Labs  ?Lab 11/23/21 ?0151  ?WBC 4.8  ?HGB 9.5*  ?HCT 28.6*  ?MCV 92.0  ?PLT 223  ? ?Basic Metabolic Panel: ?Recent Labs  ?Lab 11/23/21 ?0151  ?NA 137  ?K 4.9  ?CL 101  ?CO2 30  ?GLUCOSE 80  ?BUN 6  ?CREATININE 0.69  ?CALCIUM 8.3*  ? ?GFR: ?Estimated Creatinine Clearance: 134.1 mL/min (by C-G formula based on SCr of 0.69 mg/dL). ?Liver Function Tests: ?No results for input(s): AST, ALT, ALKPHOS, BILITOT, PROT, ALBUMIN in the last 168 hours. ?No results for input(s): LIPASE, AMYLASE in the last 168 hours. ?No results for input(s): AMMONIA in the last 168 hours. ?Coagulation Profile: ?Recent Labs  ?Lab 11/22/21 ?1443  ?INR 1.0  ? ?Cardiac Enzymes: ?No results for input(s): CKTOTAL, CKMB, CKMBINDEX, TROPONINI in the last 168 hours. ?BNP (last 3 results) ?No  results for input(s): PROBNP in the last 8760 hours. ?HbA1C: ?No results for input(s): HGBA1C in the last 72 hours. ?CBG: ?No results for input(s): GLUCAP in the last 168 hours. ?Lipid Profile: ?No results for input(s): CHOL, HDL, LDLCALC, TRIG, CHOLHDL, LDLDIRECT in the last 72 hours. ?Thyroid Function Tests: ?No results for input(s): TSH, T4TOTAL, FREET4, T3FREE, THYROIDAB in the last 72  hours. ?Anemia Panel: ?No results for input(s): VITAMINB12, FOLATE, FERRITIN, TIBC, IRON, RETICCTPCT in the last 72 hours. ?Urine analysis: ?   ?Component Value Date/Time  ? [place] (A) 11/10/2021 2301  ? APPEARANCEUR HAZY (A) 11/10/2021 2301  ? LABSPEC 1.016 11/10/2021 2301  ? PHURINE 6.0 11/10/2021 2301  ? GLUCOSEU NEGATIVE 11/10/2021 2301  ? HGBUR SMALL (A) 11/10/2021 2301  ? [place] NEGATIVE 11/10/2021 2301  ? [place] NEGATIVE 11/10/2021 2301  ? PROTEINUR 30 (A) 11/10/2021 2301  ? NITRITE NEGATIVE 11/10/2021 2301  ? LEUKOCYTESUR MODERATE (A) 11/10/2021 2301  ? ?Sepsis Labs: ?@LABRCNTIP (procalcitonin:4,lacticidven:4) ? ?) ?Recent Results (from the past 240 hour(s))  ?Blood culture (routine x 2)     Status: None  ? Collection Time: 11/14/21 12:35 PM  ? Specimen: BLOOD LEFT FOREARM  ?Result Value Ref Range Status  ? Specimen Description BLOOD LEFT FOREARM  Final  ? Special Requests   Final  ?  BOTTLES DRAWN AEROBIC AND ANAEROBIC Blood Culture adequate volume  ? Culture   Final  ?  NO GROWTH 6 DAYS ?Performed at [facility], [address], [place], [place] [address] ?  ? Report Status 11/20/2021 FINAL  Final  ?Blood culture (routine x 2)     Status: None  ? Collection Time: 11/14/21 12:57 PM  ? Specimen: BLOOD LEFT ARM  ?Result Value Ref Range Status  ? Specimen Description BLOOD LEFT ARM  Final  ? Special Requests   Final  ?  BOTTLES DRAWN AEROBIC AND ANAEROBIC Blood Culture results may not be optimal due to an excessive volume of blood received in culture bottles  ? Culture   Final  ?  NO GROWTH 6 DAYS ?Performed at [facility], [address], [place], [place] [address] ?  ? Report Status 11/20/2021 FINAL  Final  ?MRSA Next Gen by PCR, Nasal     Status: None  ? Collection Time: 11/15/21 10:48 AM  ? Specimen: Nasal Mucosa; Nasal Swab  ?Result Value Ref Range Status  ? MRSA by PCR Next Gen NOT DETECTED NOT DETECTED Final  ?  Comment: (NOTE) ?The GeneXpert MRSA Assay (FDA approved for NASAL specimens  only), ?is one component of a comprehensive MRSA colonization surveillance ?program. It is not intended to diagnose MRSA infection nor to guide ?or monitor treatment for MRSA infections. ?Test performance is not FDA approved in patients less than 2 years ?old. ?Performed at [facility], [address] [place], [place] [address] ?  ?  ? ?Radiology Studies: ?ECHO TEE ? ?Result Date: 11/23/2021 ?   TRANSESOPHOGEAL ECHO REPORT   Patient Name:   Reginald Clarke Date of Exam: 11/23/2021 Medical Rec #:  [id number]    Height:       70.0 in Accession #:    [id number]   Weight:       154.8 lb Date of Birth:  11-19-1990    BSA:          1.872 m? Patient Age:    30 years     BP:           116/60 mmHg Patient Gender: M  HR:           101 bpm. Exam Location:  Inpatient Procedure: 3D Echo, Transesophageal Echo, Cardiac Doppler and Color Doppler Indications:     Tricuspid endocarditis  History:         Patient has prior history of Echocardiogram examinations, most                  recent 11/15/2021. Endocarditis; Signs/Symptoms:Bacteremia.                  Polysubstance abuse.  Sonographer:     Roseanna Rainbow RDCS Referring Phys:  [id number] Margie Billet Diagnosing Phys: Rudean Haskell MD PROCEDURE: After discussion of the risks and benefits of a TEE, an informed consent was obtained from the patient. The transesophogeal probe was passed without difficulty through the esophogus of the patient. Imaged were obtained with the patient in a left lateral decubitus position. Sedation performed by different physician. The patient was monitored while under deep sedation. Anesthestetic sedation was provided intravenously by Anesthesiology: 225mg  of Propofol. The patient's vital signs; including heart rate, blood pressure, and oxygen saturation; remained stable throughout the procedure. The patient developed no complications during the procedure. IMPRESSIONS  1. The tricuspid valve is abnormal; there are vegetations on all tricuspid leaflets.  Largest in 2.0 cm X 0.6 cm on the anterior leaflet. 0.86 X 0.55 cm vegetation on the septal leaflet. 0.58 cm X 0.67 cm vegetation on the posterior leaflet. Tricuspid valve regurgitation is severe and e

## 2021-11-25 DIAGNOSIS — R7881 Bacteremia: Secondary | ICD-10-CM | POA: Diagnosis not present

## 2021-11-25 DIAGNOSIS — F1120 Opioid dependence, uncomplicated: Secondary | ICD-10-CM

## 2021-11-25 DIAGNOSIS — I2690 Septic pulmonary embolism without acute cor pulmonale: Secondary | ICD-10-CM

## 2021-11-25 MED ORDER — KETOROLAC TROMETHAMINE 30 MG/ML IJ SOLN
30.0000 mg | Freq: Four times a day (QID) | INTRAMUSCULAR | Status: AC | PRN
  Administered 2021-11-25 – 2021-11-30 (×16): 30 mg via INTRAVENOUS
  Filled 2021-11-25 (×17): qty 1

## 2021-11-25 MED ORDER — ALPRAZOLAM 0.5 MG PO TABS
1.0000 mg | ORAL_TABLET | Freq: Three times a day (TID) | ORAL | Status: DC | PRN
  Administered 2021-11-25 – 2021-12-05 (×29): 1 mg via ORAL
  Filled 2021-11-25 (×29): qty 2

## 2021-11-25 NOTE — Plan of Care (Signed)

## 2021-11-25 NOTE — Progress Notes (Signed)
?PROGRESS NOTE ? ? ? ?Reginald Clarke  [id number] DOB: Oct 25, 1990 DOA: 11/14/2021 ?PCP: Pcp, No  ? ?  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 ?New events last 24 hours / Subjective: ?Patient reports feeling very anxious regarding his procedure scheduled for Wednesday.  Asking for increased dose of his Xanax ? ?Assessment & Plan: ? Principal Problem: ?  Bacteremia ?Active Problems: ?  Endocarditis of tricuspid valve ?  Chronic pain syndrome ?  CAP (community acquired pneumonia) ?  GERD (gastroesophageal reflux disease) ?  Tobacco abuse ?  Polysubstance abuse ([name]) ?  Hepatitis C, chronic ([name]) ?  Septic pulmonary embolism ([name]) ?  Methadone dependence ([name]) ? ? ?MSSA bacteremia with septic pulmonary emboli, tricuspid valve endocarditis ?-Repeat blood culture 4/12 negative ?-Continue IV Ancef per ID ?-Cardiothoracic surgery planning for angio [facility] Wednesday ? ?Chronic back pain, chronic methadone dependence  ?-MRI negative for discitis, osteomyelitis, abscess ?-Continue methadone, followed at methadone clinic ? ?Polysubstance abuse, tobacco abuse ?-UDS positive for THC, amphetamine ?-Cessation counseling ? ?Chronic hepatitis C ?-Follow-up with ID outpatient ? ?Anxiety ?-Xanax and hydroxyzine as needed ? ? ? ? ?DVT prophylaxis:  ?enoxaparin (LOVENOX) injection 40 mg Start: 11/24/21 1800 ? ?Code Status: Full code ?Family Communication: No family at bedside ?Disposition Plan:  ?Status is: Inpatient ?Remains inpatient appropriate because: IV  antibiotics ? ?Antimicrobials:  ?Anti-infectives (From admission, onward)  ? ? Start     Dose/Rate Route Frequency Ordered Stop  ? 11/24/21 0000  ceFAZolin (ANCEF) IVPB 2g/100 mL premix       ? 2 g ?200 mL/hr over 30 Minutes Intravenous Every 8 hours 11/23/21 1950    ? 11/14/21 1400  ceFAZolin (ANCEF) injection 2 g  Status:  Discontinued       ? 2 g Intramuscular Every 8 hours 11/14/21 1050 11/14/21 1105  ? 11/14/21 1115  ceFAZolin (ANCEF) IVPB 2g/100 mL premix  Status:  Discontinued       ? 2 g ?200 mL/hr over 30 Minutes Intravenous Every 8 hours 11/14/21 1105 11/23/21 1950  ? 11/14/21 1100  vancomycin (VANCOREADY) IVPB 1500 mg/300 mL  Status:  Discontinued       ? 1,500 mg ?150 mL/hr over 120 Minutes Intravenous Every 12 hours 11/14/21 1047 11/14/21 1102  ? ?  ? ? ? ?Objective: ?Vitals:  ? 11/24/21 1936 11/24/21 2312 11/25/21 0247 11/25/21 0944  ?BP: (!) 102/59 115/70 115/66 111/67  ?Pulse: 92 100 98 95  ?Resp: 20 20 20 20   ?Temp:  98 ?F (36.7 ?C) 97.8 ?F (36.6 ?C) 97.8 ?F (36.6 ?C)  ?TempSrc:  Oral Oral Oral  ?SpO2: 100% 100% 92% 96%  ?Weight:      ?Height:      ? ? ?Intake/Output Summary (Last 24 hours) at 11/25/2021 1015 ?Last data filed at 11/24/2021 1507 ?Gross per 24 hour  ?Intake 100 ml  ?Output --  ?Net 100 ml  ? ?Filed Weights  ? 11/14/21 1023 11/14/21 2000  ?Weight: 70.8 kg 70.2 kg  ? ? ?Examination:  ?General exam: Appears calm and comfortable  ?Respiratory system:  Clear to auscultation. Respiratory effort normal. No respiratory distress. No conversational dyspnea.  ?Cardiovascular system: S1 & S2 heard, tachycardic, regular rhythm. No murmurs. No pedal edema. ?Gastrointestinal system: Abdomen is nondistended, soft and nontender. Normal bowel sounds heard. ?Central nervous system: Alert and oriented.  ?Extremities: Symmetric in appearance  ? ? ?Data Reviewed: I have personally reviewed following labs and imaging studies ? ?CBC: ?Recent Labs  ?Lab 11/23/21 ?0151  ?WBC 4.8  ?HGB 9.5*  ?HCT 28.6*  ?MCV 92.0   ?PLT 223  ? ?Basic Metabolic Panel: ?Recent Labs  ?Lab 11/23/21 ?0151  ?NA 137  ?K 4.9  ?CL 101  ?CO2 30  ?GLUCOSE 80  ?BUN 6  ?CREATININE 0.69  ?CALCIUM 8.3*  ? ?GFR: ?Estimated Creatinine Clearance: 134.1 mL/min (by C-G formula based on SCr of 0.69 mg/dL). ?Liver Function Tests: ?No results for input(s): AST, ALT, ALKPHOS, BILITOT, PROT, ALBUMIN in the last 168 hours. ?No results for input(s): LIPASE, AMYLASE in the last 168 hours. ?No results for input(s): AMMONIA in the last 168 hours. ?Coagulation Profile: ?Recent Labs  ?Lab 11/22/21 ?1443  ?INR 1.0  ? ?Cardiac Enzymes: ?No results for input(s): CKTOTAL, CKMB, CKMBINDEX, TROPONINI in the last 168 hours. ?BNP (last 3 results) ?No results for input(s): PROBNP in the last 8760 hours. ?HbA1C: ?No results for input(s): HGBA1C in the last 72 hours. ?CBG: ?No results for input(s): GLUCAP in the last 168 hours. ?Lipid Profile: ?No results for input(s): CHOL, HDL, LDLCALC, TRIG, CHOLHDL, LDLDIRECT in the last 72 hours. ?Thyroid Function Tests: ?No results for input(s): TSH, T4TOTAL, FREET4, T3FREE, THYROIDAB in the last 72 hours. ?Anemia Panel: ?No results for input(s): VITAMINB12, FOLATE, FERRITIN, TIBC, IRON, RETICCTPCT in the last 72 hours. ?Sepsis Labs: ?No results for input(s): PROCALCITON, LATICACIDVEN in the last 168 hours. ? ?Recent Results (from the past 240 hour(s))  ?MRSA Next Gen by PCR, Nasal     Status: None  ? Collection Time: 11/15/21 10:48 AM  ? Specimen: Nasal Mucosa; Nasal Swab  ?Result Value Ref Range Status  ? MRSA by PCR Next Gen NOT DETECTED NOT DETECTED Final  ?  Comment: (NOTE) ?The GeneXpert MRSA Assay (FDA approved for NASAL specimens only), ?is one component of a comprehensive MRSA colonization surveillance ?program. It is not intended to diagnose MRSA infection nor to guide ?or monitor treatment for MRSA infections. ?Test performance is not FDA approved in patients less than 2 years ?old. ?Performed at [facility], [address]  [place], [place] [address] ?  ?  ? ? ?Radiology Studies: ?No results found. ? ? ? ?Scheduled Meds: ? enoxaparin (LOVENOX) injection  40 mg Subcutaneous Q24H  ? methadone  130 mg Oral Daily  ? And  ? methadone  5 mg Oral Daily  ? pantoprazole  40 mg Oral Daily  ? ?Continuous Infusions: ?  ceFAZolin (ANCEF) IV 2 g (11/25/21 0927)  ? ? ? LOS: 11 days  ? ? ? ?Noralee Stain, DO ?[facility] ?11/25/2021, 10:15 AM  ? ?Available via Epic secure chat 7am-7pm ?After these hours, please refer to coverage provider listed on [email or web address] ? ?

## 2021-11-26 DIAGNOSIS — R7881 Bacteremia: Secondary | ICD-10-CM | POA: Diagnosis not present

## 2021-11-26 NOTE — Progress Notes (Signed)
? ?[facility] Infectious Diseases Follow Up Note ? ?Patient Identification: ?Patient Name: Reginald Clarke MRN: [id number] Admit Date: 11/14/2021 10:24 AM ?Age: 31 y.o.Today's Date: 11/26/2021 ? ?Reason for Visit: follow up on endocarditis  ? ?Principal Problem: ?  Bacteremia ?Active Problems: ?  Chronic pain syndrome ?  CAP (community acquired pneumonia) ?  GERD (gastroesophageal reflux disease) ?  Tobacco abuse ?  Endocarditis of tricuspid valve ?  Polysubstance abuse ([name]) ?  Hepatitis C, chronic ([name]) ?  Septic pulmonary embolism ([name]) ?  Methadone dependence ([name]) ? ? ?Antibiotics: ?Cefazolin 4/12-c ? ?Lines/Hardwares:  ? ?Interval Events: one episode of low grade temp 100.2 otherwise afebrile  ? ? ?Assessment ?MSSA native TV endocarditis with multiple vegetations complicated with septic pulmonary emboli and rt pleural effusion ( no fluid to drain per IR 4/17) ?Severe TVR ?-     Planning for angiovac by CVTS on Wednesday  ? ?Back pain with 4/13 MRI with reactive edema in L4-L5 and L5-S1 facets - Back pain seems to be stable on exam today ? ?Hepatitis C  ? ?Polysubstance us - prior h/o heroin use and currently on methadone with most recent h/o methamphetamine use ? ?Recommendations ?Continue cefazolin  ?HCV tx as OP ?Follow surgical plans from CVTS. Please send cultures and path as appropriate when goes to OR ?Following intermittently ? ?Rest of the management as per the primary team. ?Thank you for the consult. Please page with pertinent questions or concerns. ? ?______________________________________________________________________ ?Subjective ?patient seen and examined at the bedside.  ?Denies any complaints at this time except mild nausea over the weekend.  ? ?Vitals ?BP 113/63 (BP Location: Right Arm)   Pulse (!) 106   Temp 98.3 ?F (36.8 ?C) (Oral)   Resp 19   Ht 5\' 10"  (1.778 m)   Wt 70.2 kg   SpO2 94%   BMI 22.21 kg/m?  ?  ?  ?Physical  Exam ?Constitutional:  sitting up in the bed  ?   Comments:  ? ?Cardiovascular:  ?   Rate and Rhythm: Normal rate and regular rhythm.  ?   Heart sounds:  ? ?Pulmonary:  ?   Effort: Pulmonary effort is normal.  ?   Comments: bilateral clear air entry  ? ?Abdominal:  ?   Palpations: Abdomen is soft.  ?   Tenderness: non distended and non tender  ? ?Musculoskeletal:     ?   General: No swelling or tenderness in peripheral joints. No vertebral tenderness  ? ?Skin: ?   Comments:  ? ?Neurological:  ?   General: Grossly non focal, awake, alert and oriented  ? ?Psychiatric:     ?   Mood and Affect: Mood normal.  ? ?Pertinent Microbiology ?Results for orders placed or performed during the hospital encounter of 11/14/21  ?Blood culture (routine x 2)     Status: None  ? Collection Time: 11/14/21 12:35 PM  ? Specimen: BLOOD LEFT FOREARM  ?Result Value Ref Range Status  ? Specimen Description BLOOD LEFT FOREARM  Final  ? Special Requests   Final  ?  BOTTLES DRAWN AEROBIC AND ANAEROBIC Blood Culture adequate volume  ? Culture   Final  ?  NO GROWTH 6 DAYS ?Performed at [facility], [address] [place], [place] [id number] ?  ? Report Status 11/20/2021 FINAL  Final  ?Blood culture (routine x 2)     Status: None  ? Collection Time: 11/14/21 12:57 PM  ? Specimen: BLOOD LEFT ARM  ?Result Value Ref Range Status  ?  Specimen Description BLOOD LEFT ARM  Final  ? Special Requests   Final  ?  BOTTLES DRAWN AEROBIC AND ANAEROBIC Blood Culture results may not be optimal due to an excessive volume of blood received in culture bottles  ? Culture   Final  ?  NO GROWTH 6 DAYS ?Performed at [facility], [address], [place], [place] [address] ?  ? Report Status 11/20/2021 FINAL  Final  ?MRSA Next Gen by PCR, Nasal     Status: None  ? Collection Time: 11/15/21 10:48 AM  ? Specimen: Nasal Mucosa; Nasal Swab  ?Result Value Ref Range Status  ? MRSA by PCR Next Gen NOT DETECTED NOT DETECTED Final  ?  Comment: (NOTE) ?The GeneXpert MRSA Assay (FDA  approved for NASAL specimens only), ?is one component of a comprehensive MRSA colonization surveillance ?program. It is not intended to diagnose MRSA infection nor to guide ?or monitor treatment for MRSA infections. ?Test performance is not FDA approved in patients less than 2 years ?old. ?Performed at [facility], [address] [place], [place] [address] ?  ? ? ? ?Pertinent Lab. ? ?  Latest Ref Rng & Units 11/23/2021  ?  1:51 AM 11/17/2021  ?  5:09 AM 11/15/2021  ?  5:03 AM  ?CBC  ?WBC 4.0 - 10.5 K/uL 4.8   16.4   12.2    ?Hemoglobin 13.0 - 17.0 g/dL 9.5   08.1   44.8    ?Hematocrit 39.0 - 52.0 % 28.6   34.7   32.7    ?Platelets 150 - 400 K/uL 223   412   289    ? ? ?  Latest Ref Rng & Units 11/23/2021  ?  1:51 AM 11/16/2021  ?  2:30 PM 11/15/2021  ?  5:03 AM  ?CMP  ?Glucose 70 - 99 mg/dL 80   81   185    ?BUN 6 - 20 mg/dL 6   9   8     ?Creatinine 0.61 - 1.24 mg/dL   6.31   4.97    ?Sodium 135 - 145 mmol/L 137   134   135    ?Potassium 3.5 - 5.1 mmol/L 4.9   4.1   4.2    ?Chloride 98 - 111 mmol/L 101   98   100    ?CO2 22 - 32 mmol/L 30   29   28     ?Calcium 8.9 - 10.3 mg/dL 8.3   8.7   8.3    ? ? ? ?Pertinent Imaging today ?Plain films and CT images have been personally visualized and interpreted; radiology reports have been reviewed. Decision making incorporated into the Impression / Recommendations. ? ?DG Orthopantogram ? ?Result Date: 11/19/2021 ?CLINICAL DATA:  Tooth pain EXAM: ORTHOPANTOGRAM/PANORAMIC COMPARISON:  None. FINDINGS: Panorex view of the mandible was performed. Extremely poor dentition is noted. There are numerous missing teeth bilaterally. Marked erosive changes are seen within the residual upper and lower premolars and molars. Erosive changes are seen within the upper incisors. No acute bony abnormalities. Visualized sinuses are clear. IMPRESSION: 1. Extremely poor dentition, with numerous missing teeth bilaterally and marked erosive changes of the residual incisors, premolars, and molars. 2. No  acute bony abnormality. Electronically Signed   By: M.D.   On: 11/19/2021 22:16  ? ?DG Chest 2 View ? ?Result Date: 11/14/2021 ?CLINICAL DATA:  PNA monitoring EXAM: CHEST - 2 VIEW COMPARISON:  November 11, 2021. FINDINGS: New right basilar airspace opacities. New  small right pleural effusion. Left lung is clear. No visible pneumothorax. Cardiomediastinal silhouette is within normal limits. IMPRESSION: Findings compatible with right basilar pneumonia and small right parapneumonic effusion. Electronically Signed   By: Feliberto Harts M.D.   On: 11/14/2021 11:15  ? ?DG Chest 2 View ? ?Result Date: 11/11/2021 ?CLINICAL DATA:  Nausea, vomiting and diarrhea EXAM: CHEST - 2 VIEW COMPARISON:  02/24/2021 FINDINGS: The heart size and mediastinal contours are within normal limits. Both lungs are clear. The visualized skeletal structures are unremarkable. IMPRESSION: No active cardiopulmonary disease. Electronically Signed   By: Deatra Robinson M.D.   On: 11/11/2021 02:35  ? ?MR THORACIC SPINE W WO CONTRAST ? ?Result Date: 11/15/2021 ?CLINICAL DATA:  Initial evaluation for chronic mid back pain. EXAM: MRI THORACIC WITHOUT AND WITH CONTRAST TECHNIQUE: Multiplanar and multiecho pulse sequences of the thoracic spine were obtained without and with intravenous contrast. CONTRAST:  75mL GADAVIST GADOBUTROL 1 MMOL/ML IV SOLN COMPARISON:  None. FINDINGS: Alignment: Physiologic with preservation of the normal thoracic kyphosis. No listhesis. Vertebrae: Vertebral body height maintained without acute or chronic fracture. Bone marrow signal intensity diffusely decreased on T1 weighted sequence, nonspecific, but most commonly related to anemia, smoking or obesity. No discrete or worrisome osseous lesions. No evidence for osteomyelitis discitis or septic arthritis. Cord: Normal signal and morphology. No epidural abscess or other collection. Paraspinal and other soft tissues: No paraspinous phlegmon or collection. Scattered patchy  multifocal opacities within the partially visualized lungs, suspicious for possible septic emboli, worse on the right. Layering right pleural effusion. Disc levels: T4-5: Small central disc protrusion indents the ventral thecal

## 2021-11-26 NOTE — Progress Notes (Signed)
?PROGRESS NOTE ? ? ? ?Reginald Clarke  [id number] DOB: 24-Jun-1991 DOA: 11/14/2021 ?PCP: Pcp, No  ? ?  ?Brief Narrative:  ?31yo with a history of IV drug abuse, GERD, chronic pain followed in the methadone clinic, and recent diagnosis of ?community-acquired pneumonia was called to return to the ED for positive blood cultures ?-Admitted with MSSA bacteremia, on further work-up was found to have tricuspid valve endocarditis with severe TR and septic pulmonary emboli ?-TEE 4/21 noted severe tricuspid regurgitation, large vegetations on tricuspid valve ?-ID ENT CTS following ?-Remains on Ancef, planned for OR angio VAC 4/26 ? ?New events last 24 hours / Subjective: ?No new complaints today.  Awaiting for his Toradol dose this morning ? ?Assessment & Plan: ? Principal Problem: ?  Bacteremia ?Active Problems: ?  Endocarditis of tricuspid valve ?  Chronic pain syndrome ?  CAP (community acquired pneumonia) ?  GERD (gastroesophageal reflux disease) ?  Tobacco abuse ?  Polysubstance abuse ([place]) ?  Hepatitis C, chronic ([place]) ?  Septic pulmonary embolism ([place]) ?  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 ? ?MSSA bacteremia with septic pulmonary emboli, tricuspid valve endocarditis ?-Repeat blood culture 4/12 negative ?-Continue IV Ancef per ID ?-Cardiothoracic surgery planning for angio [facility] Wednesday ? ?Chronic back pain, chronic methadone dependence  ?-MRI negative for discitis, osteomyelitis, abscess ?-Continue methadone, followed at methadone clinic ?-Toradol as needed ? ?Polysubstance abuse, tobacco abuse ?-UDS positive for THC, amphetamine ?-Cessation counseling ? ?Chronic hepatitis C ?-Follow-up with ID outpatient ? ?Anxiety ?-Xanax and hydroxyzine as needed ? ? ? ? ?DVT prophylaxis:  ?enoxaparin (LOVENOX) injection 40 mg Start: 11/24/21 1800 ? ?Code Status: Full code ?Family Communication: No family at bedside ?Disposition Plan:  ?Status is: Inpatient ?Remains inpatient appropriate because: IV antibiotics ? ?Antimicrobials:   ?Anti-infectives (From admission, onward)  ? ? Start     Dose/Rate Route Frequency Ordered Stop  ? 11/24/21 0000  ceFAZolin (ANCEF) IVPB 2g/100 mL premix       ? 2 g ?200 mL/hr over 30 Minutes Intravenous Every 8 hours 11/23/21 1950    ? 11/14/21 1400  ceFAZolin (ANCEF) injection 2 g  Status:  Discontinued       ? 2 g Intramuscular Every 8 hours 11/14/21 1050 11/14/21 1105  ? 11/14/21 1115  ceFAZolin (ANCEF) IVPB 2g/100 mL premix  Status:  Discontinued       ? 2 g ?200 mL/hr over 30 Minutes Intravenous Every 8 hours 11/14/21 1105 11/23/21 1950  ? 11/14/21 1100  vancomycin (VANCOREADY) IVPB 1500 mg/300 mL  Status:  Discontinued       ? 1,500 mg ?150 mL/hr over 120 Minutes Intravenous Every 12 hours 11/14/21 1047 11/14/21 1102  ? ?  ? ? ? ?Objective: ?Vitals:  ? 11/26/21 0002 11/26/21 0012 11/26/21 0502 11/26/21 0848  ?BP: 93/74 123/64 102/70 113/63  ?Pulse: (!) 105 (!) 105 (!) 103 (!) 106  ?Resp: 20  20 19   ?Temp: 100.2 ?F (37.9 ?C) 99.5 ?F (37.5 ?C) 98.5 ?F (36.9 ?C) 98.3 ?F (36.8 ?C)  ?TempSrc: Oral Oral Oral Oral  ?SpO2: 91% 99% 94% 94%  ?Weight:      ?Height:      ? ?No intake or output data in the 24 hours ending 11/26/21 0955 ? ?Filed Weights  ? 11/14/21 1023 11/14/21 2000  ?Weight: 70.8 kg 70.2 kg  ? ? ?Examination:  ?General exam: Appears calm and comfortable  ?Respiratory system: Clear to auscultation. Respiratory effort normal. No respiratory distress. No conversational dyspnea.  ?Cardiovascular system: S1 &  S2 heard, tachycardic, regular rhythm, rate 120. No murmurs. No pedal edema. ?Gastrointestinal system: Abdomen is nondistended, soft and nontender. Normal bowel sounds heard. ?Central nervous system: Alert and oriented.  ?Extremities: Symmetric in appearance  ? ? ?Data Reviewed: I have personally reviewed following labs and imaging studies ? ?CBC: ?Recent Labs  ?Lab 11/23/21 ?0151  ?WBC 4.8  ?HGB 9.5*  ?HCT 28.6*  ?MCV 92.0  ?PLT 223  ? ? ?Basic Metabolic Panel: ?Recent Labs  ?Lab 11/23/21 ?0151  ?NA  137  ?K 4.9  ?CL 101  ?CO2 30  ?GLUCOSE 80  ?BUN 6  ?CREATININE 0.69  ?CALCIUM 8.3*  ? ? ?GFR: ?Estimated Creatinine Clearance: 134.1 mL/min (by C-G formula based on SCr of 0.69 mg/dL). ?Liver Function Tests: ?No results for input(s): AST, ALT, ALKPHOS, BILITOT, PROT, ALBUMIN in the last 168 hours. ?No results for input(s): LIPASE, AMYLASE in the last 168 hours. ?No results for input(s): AMMONIA in the last 168 hours. ?Coagulation Profile: ?Recent Labs  ?Lab 11/22/21 ?1443  ?INR 1.0  ? ? ?Cardiac Enzymes: ?No results for input(s): CKTOTAL, CKMB, CKMBINDEX, TROPONINI in the last 168 hours. ?BNP (last 3 results) ?No results for input(s): PROBNP in the last 8760 hours. ?HbA1C: ?No results for input(s): HGBA1C in the last 72 hours. ?CBG: ?No results for input(s): GLUCAP in the last 168 hours. ?Lipid Profile: ?No results for input(s): CHOL, HDL, LDLCALC, TRIG, CHOLHDL, LDLDIRECT in the last 72 hours. ?Thyroid Function Tests: ?No results for input(s): TSH, T4TOTAL, FREET4, T3FREE, THYROIDAB in the last 72 hours. ?Anemia Panel: ?No results for input(s): VITAMINB12, FOLATE, FERRITIN, TIBC, IRON, RETICCTPCT in the last 72 hours. ?Sepsis Labs: ?No results for input(s): PROCALCITON, LATICACIDVEN in the last 168 hours. ? ?No results found for this or any previous visit (from the past 240 hour(s)). ?  ? ? ?Radiology Studies: ?No results found. ? ? ? ?Scheduled Meds: ? enoxaparin (LOVENOX) injection  40 mg Subcutaneous Q24H  ? methadone  130 mg Oral Daily  ? And  ? methadone  5 mg Oral Daily  ? pantoprazole  40 mg Oral Daily  ? ?Continuous Infusions: ?  ceFAZolin (ANCEF) IV 2 g (11/26/21 0936)  ? ? ? LOS: 12 days  ? ? ? ?Dessa Phi, DO ?[facility] ?11/26/2021, 9:55 AM  ? ?Available via Epic secure chat 7am-7pm ?After these hours, please refer to coverage provider listed on [email or web address] ? ?

## 2021-11-27 ENCOUNTER — Encounter (HOSPITAL_COMMUNITY): Payer: Self-pay | Admitting: Internal Medicine

## 2021-11-27 DIAGNOSIS — R7881 Bacteremia: Secondary | ICD-10-CM | POA: Diagnosis not present

## 2021-11-27 LAB — BASIC METABOLIC PANEL
Anion gap: 4 — ABNORMAL LOW (ref 5–15)
BUN: 5 mg/dL — ABNORMAL LOW (ref 6–20)
CO2: 32 mmol/L (ref 22–32)
Calcium: 8.5 mg/dL — ABNORMAL LOW (ref 8.9–10.3)
Chloride: 100 mmol/L (ref 98–111)
Creatinine, Ser: 0.78 mg/dL (ref 0.61–1.24)
GFR, Estimated: >60 mL/min (ref >60)
Glucose, Bld: 110 mg/dL — ABNORMAL HIGH (ref 70–99)
Potassium: 4.1 mmol/L (ref 3.5–5.1)
Sodium: 136 mmol/L (ref 135–145)

## 2021-11-27 LAB — TYPE AND SCREEN
ABO/RH(D): O POS
Antibody Screen: NEGATIVE

## 2021-11-27 LAB — ABO/RH: ABO/RH(D): O POS

## 2021-11-27 NOTE — Progress Notes (Signed)
?PROGRESS NOTE ? ? ? ?[place] Reiger  LHT:[id number] DOB: May 21, 1991 DOA: 11/14/2021 ?PCP: Pcp, No  ? ?  ?Brief Narrative:  ?31yo with a history of IV drug abuse, GERD, chronic pain followed in the methadone clinic, and recent diagnosis of ?community-acquired pneumonia was called to return to the ED for positive blood cultures ?-Admitted with MSSA bacteremia, on further work-up was found to have tricuspid valve endocarditis with severe TR and septic pulmonary emboli ?-TEE 4/21 noted severe tricuspid regurgitation, large vegetations on tricuspid valve ?-ID ENT CTS following ?-Remains on Ancef, planned for OR angiovac 4/26 ? ?New events last 24 hours / Subjective: ?Anxious regarding angiovac tomorrow  ? ?Assessment & Plan: ? Principal Problem: ?  Bacteremia ?Active Problems: ?  Endocarditis of tricuspid valve ?  Chronic pain syndrome ?  CAP (community acquired pneumonia) ?  GERD (gastroesophageal reflux disease) ?  Tobacco abuse ?  Polysubstance abuse ([name]) ?  Hepatitis C, chronic ([name]) ?  Septic pulmonary embolism ([name]) ?  Methadone dependence ([name]) ? ? ?MSSA bacteremia with septic pulmonary emboli, tricuspid valve endocarditis ?-Repeat blood culture 4/12 negative ?-Continue IV Ancef per ID ?-Cardiothoracic surgery planning for angiovac 4/26  ? ?Chronic back pain, chronic methadone dependence  ?-MRI negative for discitis, osteomyelitis, abscess ?-Continue methadone, followed at methadone clinic ?-Toradol as needed ? ?Polysubstance abuse, tobacco abuse ?-UDS positive for THC, amphetamine ?-Cessation counseling ? ?Chronic hepatitis C ?-Follow-up with ID outpatient ? ?Anxiety ?-Xanax and hydroxyzine as needed ? ? ? ? ?DVT prophylaxis:  ?enoxaparin (LOVENOX) injection 40 mg Start: 11/24/21 1800 ? ?Code Status: Full code ?Family Communication: No family at bedside ?Disposition Plan:  ?Status is: Inpatient ?Remains inpatient appropriate because: IV antibiotics, angiovac 4/26 ? ?Antimicrobials:  ?Anti-infectives (From  admission, onward)  ? ? Start     Dose/Rate Route Frequency Ordered Stop  ? 11/24/21 0000  ceFAZolin (ANCEF) IVPB 2g/100 mL premix       ? 2 g ?200 mL/hr over 30 Minutes Intravenous Every 8 hours 11/23/21 1950    ? 11/14/21 1400  ceFAZolin (ANCEF) injection 2 g  Status:  Discontinued       ? 2 g Intramuscular Every 8 hours 11/14/21 1050 11/14/21 1105  ? 11/14/21 1115  ceFAZolin (ANCEF) IVPB 2g/100 mL premix  Status:  Discontinued       ? 2 g ?200 mL/hr over 30 Minutes Intravenous Every 8 hours 11/14/21 1105 11/23/21 1950  ? 11/14/21 1100  vancomycin (VANCOREADY) IVPB 1500 mg/300 mL  Status:  Discontinued       ? 1,500 mg ?150 mL/hr over 120 Minutes Intravenous Every 12 hours 11/14/21 1047 11/14/21 1102  ? ?  ? ? ? ?Objective: ?Vitals:  ? 11/26/21 1955 11/27/21 0036 11/27/21 0421 11/27/21 0754  ?BP: (!) 102/59 (!) 102/58 (!) 97/53 116/71  ?Pulse: 97 93 86 100  ?Resp: 18 18 17 18   ?Temp: 98.3 ?F (36.8 ?C) 98.5 ?F (36.9 ?C) 98.3 ?F (36.8 ?C) 98 ?F (36.7 ?C)  ?TempSrc: Oral Oral Oral Oral  ?SpO2: 100% 100% 99% 98%  ?Weight:      ?Height:      ? ? ?Intake/Output Summary (Last 24 hours) at 11/27/2021 1131 ?Last data filed at 11/27/2021 0155 ?Gross per 24 hour  ?Intake 800 ml  ?Output --  ?Net 800 ml  ? ? ?Filed Weights  ? 11/14/21 1023 11/14/21 2000  ?Weight: 70.8 kg 70.2 kg  ? ? ?Examination:  ?General exam: Appears calm and comfortable  ?Respiratory system: Clear to auscultation. Respiratory  effort normal. No respiratory distress. No conversational dyspnea.  ?Cardiovascular system: S1 & S2 heard, RRR. No murmurs. No pedal edema. ?Gastrointestinal system: Abdomen is nondistended, soft and nontender. Normal bowel sounds heard. ?Central nervous system: Alert and oriented.  ?Extremities: Symmetric in appearance  ? ? ?Data Reviewed: I have personally reviewed following labs and imaging studies ? ?CBC: ?Recent Labs  ?Lab 11/23/21 ?0151  ?WBC 4.8  ?HGB 9.5*  ?HCT 28.6*  ?MCV 92.0  ?PLT 223  ? ? ?Basic Metabolic Panel: ?Recent  Labs  ?Lab 11/23/21 ?0151 11/27/21 ?0040  ?NA 137 136  ?K 4.9 4.1  ?CL 101 100  ?CO2 30 32  ?GLUCOSE 80 110*  ?BUN 6 5*  ?CREATININE 0.69 0.78  ?CALCIUM 8.3* 8.5*  ? ? ?GFR: ?Estimated Creatinine Clearance: 134.1 mL/min (by C-G formula based on SCr of 0.78 mg/dL). ?Liver Function Tests: ?No results for input(s): AST, ALT, ALKPHOS, BILITOT, PROT, ALBUMIN in the last 168 hours. ?No results for input(s): LIPASE, AMYLASE in the last 168 hours. ?No results for input(s): AMMONIA in the last 168 hours. ?Coagulation Profile: ?Recent Labs  ?Lab 11/22/21 ?1443  ?INR 1.0  ? ? ?Cardiac Enzymes: ?No results for input(s): CKTOTAL, CKMB, CKMBINDEX, TROPONINI in the last 168 hours. ?BNP (last 3 results) ?No results for input(s): PROBNP in the last 8760 hours. ?HbA1C: ?No results for input(s): HGBA1C in the last 72 hours. ?CBG: ?No results for input(s): GLUCAP in the last 168 hours. ?Lipid Profile: ?No results for input(s): CHOL, HDL, LDLCALC, TRIG, CHOLHDL, LDLDIRECT in the last 72 hours. ?Thyroid Function Tests: ?No results for input(s): TSH, T4TOTAL, FREET4, T3FREE, THYROIDAB in the last 72 hours. ?Anemia Panel: ?No results for input(s): VITAMINB12, FOLATE, FERRITIN, TIBC, IRON, RETICCTPCT in the last 72 hours. ?Sepsis Labs: ?No results for input(s): PROCALCITON, LATICACIDVEN in the last 168 hours. ? ?No results found for this or any previous visit (from the past 240 hour(s)). ?  ? ? ?Radiology Studies: ?No results found. ? ? ? ?Scheduled Meds: ? enoxaparin (LOVENOX) injection  40 mg Subcutaneous Q24H  ? methadone  130 mg Oral Daily  ? And  ? methadone  5 mg Oral Daily  ? pantoprazole  40 mg Oral Daily  ? ?Continuous Infusions: ?  ceFAZolin (ANCEF) IV 2 g (11/27/21 0825)  ? ? ? LOS: 13 days  ? ? ? ?Noralee Stain, DO ?[facility] ?11/27/2021, 11:31 AM  ? ?Available via Epic secure chat 7am-7pm ?After these hours, please refer to coverage provider listed on [email or web address] ? ?

## 2021-11-27 NOTE — Plan of Care (Signed)
  Problem: Nutrition: Goal: Adequate nutrition will be maintained Outcome: Progressing   Problem: Safety: Goal: Ability to remain free from injury will improve Outcome: Progressing   

## 2021-11-28 ENCOUNTER — Inpatient Hospital Stay (HOSPITAL_COMMUNITY): Payer: BLUE CROSS/BLUE SHIELD | Admitting: Anesthesiology

## 2021-11-28 ENCOUNTER — Inpatient Hospital Stay (HOSPITAL_COMMUNITY): Payer: BLUE CROSS/BLUE SHIELD

## 2021-11-28 ENCOUNTER — Encounter (HOSPITAL_COMMUNITY): Payer: Self-pay | Admitting: Internal Medicine

## 2021-11-28 ENCOUNTER — Encounter (HOSPITAL_COMMUNITY)
Admission: EM | Disposition: A | Discharge status: Home / Self Care | Payer: Self-pay | Source: Home / Self Care | Attending: Family Medicine

## 2021-11-28 ENCOUNTER — Other Ambulatory Visit: Payer: Self-pay

## 2021-11-28 DIAGNOSIS — I079 Rheumatic tricuspid valve disease, unspecified: Secondary | ICD-10-CM | POA: Diagnosis not present

## 2021-11-28 DIAGNOSIS — I2690 Septic pulmonary embolism without acute cor pulmonale: Secondary | ICD-10-CM

## 2021-11-28 DIAGNOSIS — F1120 Opioid dependence, uncomplicated: Secondary | ICD-10-CM

## 2021-11-28 DIAGNOSIS — R7881 Bacteremia: Secondary | ICD-10-CM | POA: Diagnosis not present

## 2021-11-28 DIAGNOSIS — K219 Gastro-esophageal reflux disease without esophagitis: Secondary | ICD-10-CM | POA: Diagnosis not present

## 2021-11-28 DIAGNOSIS — I360 Nonrheumatic tricuspid (valve) stenosis: Secondary | ICD-10-CM

## 2021-11-28 DIAGNOSIS — I339 Acute and subacute endocarditis, unspecified: Secondary | ICD-10-CM

## 2021-11-28 DIAGNOSIS — G894 Chronic pain syndrome: Secondary | ICD-10-CM | POA: Diagnosis not present

## 2021-11-28 HISTORY — PX: TEE WITHOUT CARDIOVERSION: SHX5443

## 2021-11-28 HISTORY — PX: APPLICATION OF ANGIOVAC: SHX6777

## 2021-11-28 LAB — POCT I-STAT, CHEM 8
BUN: 6 mg/dL (ref 6–20)
Calcium, Ion: 1.22 mmol/L (ref 1.15–1.40)
Chloride: 98 mmol/L (ref 98–111)
Creatinine, Ser: 0.4 mg/dL — ABNORMAL LOW (ref 0.61–1.24)
Glucose, Bld: 82 mg/dL (ref 70–99)
HCT: 26 % — ABNORMAL LOW (ref 39.0–52.0)
Hemoglobin: 8.8 g/dL — ABNORMAL LOW (ref 13.0–17.0)
Potassium: 4.2 mmol/L (ref 3.5–5.1)
Sodium: 139 mmol/L (ref 135–145)
TCO2: 34 mmol/L — ABNORMAL HIGH (ref 22–32)

## 2021-11-28 LAB — POCT ACTIVATED CLOTTING TIME
Activated Clotting Time: 239 seconds
Activated Clotting Time: 251 seconds
Activated Clotting Time: 281 seconds

## 2021-11-28 LAB — ECHO INTRAOPERATIVE TEE
Height: 70 in
Weight: 2400 oz

## 2021-11-28 IMAGING — DX DG CHEST 1V PORT
1 series · 1 of 1 positions shown · non-contrast
Comparison: Chest x-ray [DATE].

CLINICAL DATA: Post op, central line placement

EXAM:
PORTABLE CHEST 1 VIEW

[chest]
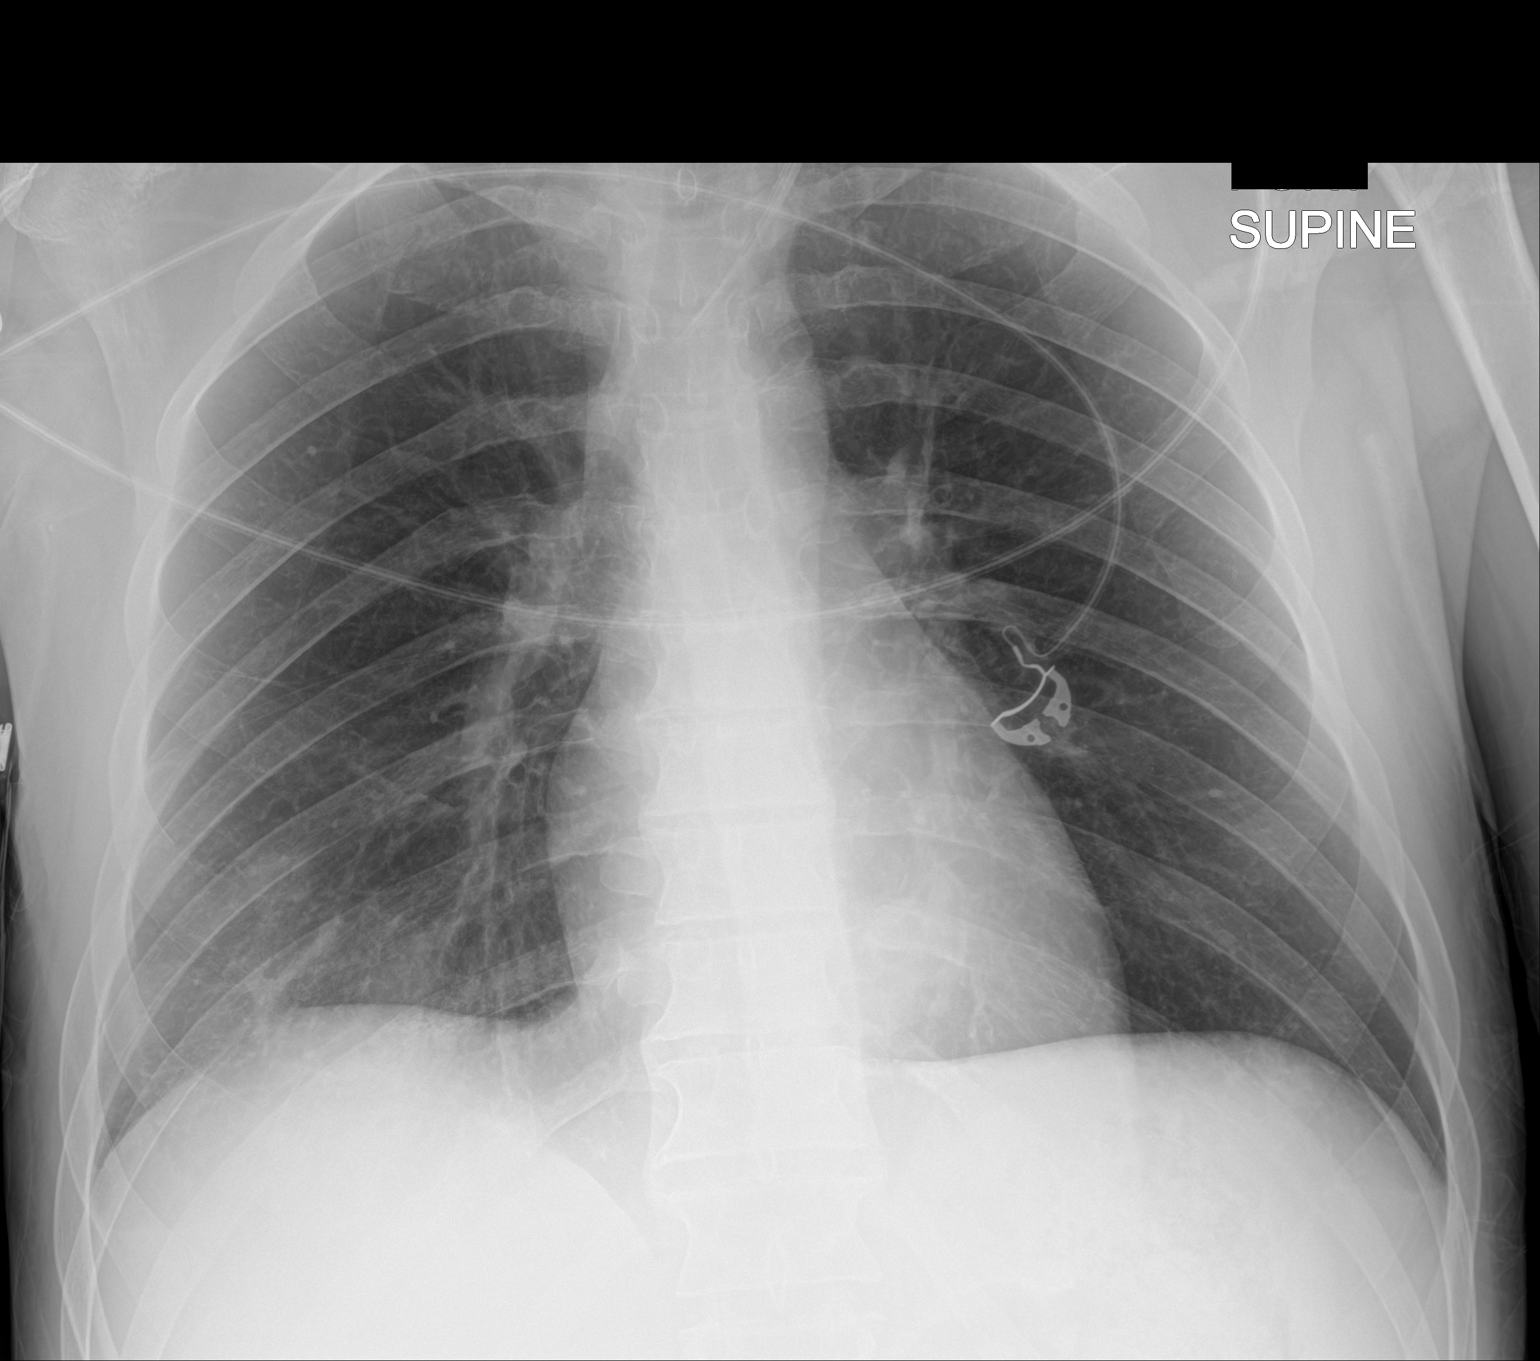

[1 of 1 positions shown; findings below may reference images not displayed]

FINDINGS: Left internal jugular central venous catheter with tip overlying the
aortic arch. Mildly improved right basilar opacities. No visible
pleural effusions or pneumothorax. Cardiomediastinal silhouette is
similar to the prior and within normal limits.
IMPRESSION: 1. Left internal jugular central venous catheter with tip overlying
the aortic arch. While this could represent a venous placement,
arterial placement cannot be excluded on this image. Recommend
correlation with blood gas analysis.
2. Mildly improved right basilar airspace opacities, concerning for
pneumonia.

Findings discussed with Dr. BULLE via telephone at [DATE]
p.m.

## 2021-11-28 SURGERY — APPLICATION OF ANGIOVAC
Anesthesia: General

## 2021-11-28 MED ORDER — CHLORHEXIDINE GLUCONATE 0.12 % MT SOLN
OROMUCOSAL | Status: AC
  Administered 2021-11-28: 15 mL via OROMUCOSAL
  Filled 2021-11-28: qty 15

## 2021-11-28 MED ORDER — HEPARIN 6000 UNIT IRRIGATION SOLUTION
Status: AC
  Filled 2021-11-28: qty 500

## 2021-11-28 MED ORDER — PHENYLEPHRINE 80 MCG/ML (10ML) SYRINGE FOR IV PUSH (FOR BLOOD PRESSURE SUPPORT)
PREFILLED_SYRINGE | INTRAVENOUS | Status: DC | PRN
  Administered 2021-11-28: 160 ug via INTRAVENOUS

## 2021-11-28 MED ORDER — ESMOLOL HCL 100 MG/10ML IV SOLN
INTRAVENOUS | Status: DC | PRN
  Administered 2021-11-28: 20 mg via INTRAVENOUS

## 2021-11-28 MED ORDER — FENTANYL CITRATE (PF) 250 MCG/5ML IJ SOLN
INTRAMUSCULAR | Status: DC | PRN
  Administered 2021-11-28: 100 ug via INTRAVENOUS
  Administered 2021-11-28: 50 ug via INTRAVENOUS

## 2021-11-28 MED ORDER — 0.9 % SODIUM CHLORIDE (POUR BTL) OPTIME
TOPICAL | Status: DC | PRN
  Administered 2021-11-28: 1000 mL

## 2021-11-28 MED ORDER — MIDAZOLAM HCL 2 MG/2ML IJ SOLN
INTRAMUSCULAR | Status: DC | PRN
  Administered 2021-11-28: 2 mg via INTRAVENOUS

## 2021-11-28 MED ORDER — FENTANYL CITRATE (PF) 100 MCG/2ML IJ SOLN
25.0000 ug | INTRAMUSCULAR | Status: DC | PRN
  Administered 2021-11-28: 50 ug via INTRAVENOUS

## 2021-11-28 MED ORDER — SUGAMMADEX SODIUM 200 MG/2ML IV SOLN
INTRAVENOUS | Status: DC | PRN
Start: 2021-11-28 — End: 2021-11-28
  Administered 2021-11-28: 200 mg via INTRAVENOUS

## 2021-11-28 MED ORDER — ACETAMINOPHEN 10 MG/ML IV SOLN: 1000.0000 mg | Freq: Once | INTRAVENOUS | Status: DC | PRN

## 2021-11-28 MED ORDER — LIDOCAINE HCL (PF) 1 % IJ SOLN
INTRAMUSCULAR | Status: AC
  Filled 2021-11-28: qty 30

## 2021-11-28 MED ORDER — CHLORHEXIDINE GLUCONATE 0.12 % MT SOLN: 15.0000 mL | Freq: Once | OROMUCOSAL | Status: AC

## 2021-11-28 MED ORDER — ORAL CARE MOUTH RINSE: 15.0000 mL | Freq: Once | OROMUCOSAL | Status: AC

## 2021-11-28 MED ORDER — ONDANSETRON HCL 4 MG/2ML IJ SOLN
INTRAMUSCULAR | Status: DC | PRN
  Administered 2021-11-28: 4 mg via INTRAVENOUS

## 2021-11-28 MED ORDER — DEXAMETHASONE SODIUM PHOSPHATE 10 MG/ML IJ SOLN
INTRAMUSCULAR | Status: DC | PRN
  Administered 2021-11-28: 5 mg via INTRAVENOUS

## 2021-11-28 MED ORDER — LACTATED RINGERS IV SOLN: INTRAVENOUS | Status: DC | PRN

## 2021-11-28 MED ORDER — BUPIVACAINE HCL (PF) 0.5 % IJ SOLN
INTRAMUSCULAR | Status: AC
  Filled 2021-11-28: qty 30

## 2021-11-28 MED ORDER — MIDAZOLAM HCL 2 MG/2ML IJ SOLN
INTRAMUSCULAR | Status: AC
  Filled 2021-11-28: qty 2

## 2021-11-28 MED ORDER — ROCURONIUM BROMIDE 10 MG/ML (PF) SYRINGE
PREFILLED_SYRINGE | INTRAVENOUS | Status: DC | PRN
Start: 2021-11-28 — End: 2021-11-28
  Administered 2021-11-28: 60 mg via INTRAVENOUS
  Administered 2021-11-28: 10 mg via INTRAVENOUS

## 2021-11-28 MED ORDER — PROPOFOL 10 MG/ML IV BOLUS
INTRAVENOUS | Status: DC | PRN
  Administered 2021-11-28: 150 mg via INTRAVENOUS

## 2021-11-28 MED ORDER — PHENYLEPHRINE HCL-NACL 20-0.9 MG/250ML-% IV SOLN
INTRAVENOUS | Status: DC | PRN
Start: 2021-11-28 — End: 2021-11-28
  Administered 2021-11-28: 30 ug/min via INTRAVENOUS

## 2021-11-28 MED ORDER — PROTAMINE SULFATE 10 MG/ML IV SOLN
INTRAVENOUS | Status: AC
  Filled 2021-11-28: qty 5

## 2021-11-28 MED ORDER — HEPARIN SODIUM (PORCINE) 1000 UNIT/ML IJ SOLN
INTRAMUSCULAR | Status: DC | PRN
Start: 2021-11-28 — End: 2021-11-28
  Administered 2021-11-28: 10000 [IU] via INTRAVENOUS
  Administered 2021-11-28: 3000 [IU] via INTRAVENOUS

## 2021-11-28 MED ORDER — FENTANYL CITRATE (PF) 100 MCG/2ML IJ SOLN
INTRAMUSCULAR | Status: AC
  Filled 2021-11-28: qty 2

## 2021-11-28 MED ORDER — LACTATED RINGERS IV SOLN: INTRAVENOUS | Status: DC

## 2021-11-28 MED ORDER — ENOXAPARIN SODIUM 40 MG/0.4ML IJ SOSY
40.0000 mg | PREFILLED_SYRINGE | INTRAMUSCULAR | Status: DC
  Administered 2021-11-29 – 2021-12-11 (×11): 40 mg via SUBCUTANEOUS
  Filled 2021-11-28 (×14): qty 0.4

## 2021-11-28 MED ORDER — MINERAL OIL LIGHT 100 % EX OIL
TOPICAL_OIL | CUTANEOUS | Status: DC | PRN
  Administered 2021-11-28: 1 via TOPICAL

## 2021-11-28 MED ORDER — FENTANYL CITRATE (PF) 250 MCG/5ML IJ SOLN
INTRAMUSCULAR | Status: AC
  Filled 2021-11-28: qty 5

## 2021-11-28 MED ORDER — PROTAMINE SULFATE 10 MG/ML IV SOLN
INTRAVENOUS | Status: DC | PRN
  Administered 2021-11-28: 10 mg via INTRAVENOUS

## 2021-11-28 MED ORDER — PROPOFOL 10 MG/ML IV BOLUS
INTRAVENOUS | Status: AC
  Filled 2021-11-28: qty 20

## 2021-11-28 MED ORDER — HEPARIN 6000 UNIT IRRIGATION SOLUTION
Status: DC | PRN
  Administered 2021-11-28: 1

## 2021-11-28 SURGICAL SUPPLY — 63 items
ANGIOVAC CIRCUIT GEN3 (MISCELLANEOUS) ×2
BAG BANDED W/RUBBER/TAPE 36X54 (MISCELLANEOUS) IMPLANT
CANISTER SUCT 3000ML PPV (MISCELLANEOUS) ×2 IMPLANT
CANNULA C180 ANGIOVAC 180 DEG (CANNULA) ×1 IMPLANT
CANNULA C20 ANGIOVAC 20 DEG (CANNULA) IMPLANT
CANNULA OPTISITE PERFUSION 16F (CANNULA) IMPLANT
CANNULA OPTISITE PERFUSION 18F (CANNULA) ×1 IMPLANT
CANNULA OPTISITE PERFUSION 20F (CANNULA) IMPLANT
CATH ROBINSON RED A/P 18FR (CATHETERS) ×2 IMPLANT
CHLORAPREP W/TINT 26 (MISCELLANEOUS) ×3 IMPLANT
CLIP VESOCCLUDE MED 24/CT (CLIP) IMPLANT
CLIP VESOCCLUDE SM WIDE 24/CT (CLIP) IMPLANT
CNTNR URN SCR LID CUP LEK RST (MISCELLANEOUS) IMPLANT
CONT SPEC 4OZ STRL OR WHT (MISCELLANEOUS) ×1
COVER PROBE W GEL 5X96 (DRAPES) ×2 IMPLANT
COVER SURGICAL LIGHT HANDLE (MISCELLANEOUS) ×2 IMPLANT
DERMABOND ADVANCED (GAUZE/BANDAGES/DRESSINGS)
DERMABOND ADVANCED .7 DNX12 (GAUZE/BANDAGES/DRESSINGS) IMPLANT
DRAPE C-ARM 42X72 X-RAY (DRAPES) IMPLANT
DRAPE INCISE IOBAN 66X45 STRL (DRAPES) ×2 IMPLANT
DRYSEAL FLEXSHEATH 18FR 33CM (SHEATH)
DRYSEAL FLEXSHEATH 26FR 33CM (SHEATH) ×1
ELECT REM PT RETURN 9FT ADLT (ELECTROSURGICAL) ×2
ELECTRODE REM PT RTRN 9FT ADLT (ELECTROSURGICAL) ×1 IMPLANT
FELT TEFLON 1X6 (MISCELLANEOUS) ×2 IMPLANT
GAUZE 4X4 16PLY ~~LOC~~+RFID DBL (SPONGE) ×2 IMPLANT
GAUZE SPONGE 2X2 8PLY NS (GAUZE/BANDAGES/DRESSINGS) ×2 IMPLANT
GLOVE BIO SURGEON STRL SZ7 (GLOVE) ×2 IMPLANT
GLOVE SS BIOGEL STRL SZ 7 (GLOVE) IMPLANT
GLOVE SUPERSENSE BIOGEL SZ 7 (GLOVE) ×2
GOWN STRL REUS W/ TWL LRG LVL3 (GOWN DISPOSABLE) ×2 IMPLANT
GOWN STRL REUS W/ TWL XL LVL3 (GOWN DISPOSABLE) ×1 IMPLANT
GOWN STRL REUS W/TWL LRG LVL3 (GOWN DISPOSABLE) ×2
GOWN STRL REUS W/TWL XL LVL3 (GOWN DISPOSABLE) ×1
GUIDEWIRE MEIER 185 .035 J-TIP (WIRE) ×1 IMPLANT
KIT BASIN OR (CUSTOM PROCEDURE TRAY) ×2 IMPLANT
KIT DILATOR VASC 18G NDL (KITS) ×2 IMPLANT
KIT TURNOVER KIT B (KITS) IMPLANT
NDL HYPO 25GX1X1/2 BEV (NEEDLE) IMPLANT
NEEDLE HYPO 25GX1X1/2 BEV (NEEDLE) IMPLANT
PACK CIRCUIT ANGIOVAC GEN3 (MISCELLANEOUS) IMPLANT
PACK ENDO MINOR (CUSTOM PROCEDURE TRAY) ×2 IMPLANT
PACK UNIVERSAL I (CUSTOM PROCEDURE TRAY) ×2 IMPLANT
PAD ARMBOARD 7.5X6 YLW CONV (MISCELLANEOUS) ×4 IMPLANT
POSITIONER HEAD DONUT 9IN (MISCELLANEOUS) ×2 IMPLANT
PUMP SARN DELFIN (MISCELLANEOUS) ×1 IMPLANT
SET MICROPUNCTURE 5F STIFF (MISCELLANEOUS) IMPLANT
SHEATH DRYSEAL FLEX 18FR 33CM (SHEATH) IMPLANT
SHEATH DRYSEAL FLEX 26FR 33CM (SHEATH) IMPLANT
SHEATH PINNACLE 8F 10CM (SHEATH) ×4 IMPLANT
SPONGE T-LAP 18X18 ~~LOC~~+RFID (SPONGE) ×10 IMPLANT
STOPCOCK 4 WAY LG BORE MALE ST (IV SETS) IMPLANT
SUT ETHIBOND X763 2 0 SH 1 (SUTURE) ×4 IMPLANT
SUT PROLENE 6 0 BV (SUTURE) IMPLANT
SUT SILK  1 MH (SUTURE) ×2
SUT SILK 1 MH (SUTURE) ×2 IMPLANT
SYR CONTROL 10ML LL (SYRINGE) IMPLANT
TAPE CLOTH SURG 4X10 WHT LF (GAUZE/BANDAGES/DRESSINGS) ×2 IMPLANT
TOWEL GREEN STERILE (TOWEL DISPOSABLE) ×4 IMPLANT
TRAY FOLEY SLVR 14FR TEMP STAT (SET/KITS/TRAYS/PACK) IMPLANT
TRAY FOLEY SLVR 16FR TEMP STAT (SET/KITS/TRAYS/PACK) IMPLANT
WATER STERILE IRR 1000ML POUR (IV SOLUTION) ×2 IMPLANT
WIRE AMPLATZ SS-J .035X180CM (WIRE) ×1 IMPLANT

## 2021-11-28 NOTE — Plan of Care (Signed)
  Problem: Clinical Measurements: Goal: Cardiovascular complication will be avoided Outcome: Progressing   

## 2021-11-28 NOTE — Transfer of Care (Signed)
Immediate Anesthesia Transfer of Care Note ? ?Patient: Reginald Clarke ? ?Procedure(s) Performed: APPLICATION OF ANGIOVAC ?TRANSESOPHAGEAL ECHOCARDIOGRAM (TEE) ? ?Patient Location: PACU ? ?Anesthesia Type:General ? ?Level of Consciousness: awake ? ?Airway & Oxygen Therapy: Patient Spontanous Breathing ? ?Post-op Assessment: Report given to RN and Post -op Vital signs reviewed and stable ? ?Post vital signs: Reviewed and stable ? ?Last Vitals:  ?Vitals Value Taken Time  ?BP 132/80   ?Temp    ?Pulse 112   ?Resp 12   ?SpO2 100   ? ? ?Last Pain:  ?Vitals:  ? 11/28/21 1116  ?TempSrc:   ?PainSc: 7   ?   ? ?Patients Stated Pain Goal: 0 (11/25/21 1012) ? ?Complications: No notable events documented. ?

## 2021-11-28 NOTE — Op Note (Signed)
? ?   ?  [address] ?      [place] [address] ?            [id number]      ?  ?  ?06/02/2019 ?  ?Patient: Reginald Clarke ?Pre-Op Dx:     Tricuspid valve endocarditis ?  MSSA bacteremia ?                        Sepsis ?                        Septic pulmonary emboli   ?Post-op Dx:  same ?Procedure: ?-Right femoral vein cannulation with a 18 F cannula ?- Right internal jugular vein cannulation with a 81F Sheath ?- Right heart cannulation ?- Debridement of right atrial mass ?- Debridement of tricuspid valve vegetation ?  ?Surgeon and Role:   ?   * Lajuana Matte, MD - Primary ?   *M. Roddenberry, PA-C - assisting ?Anesthesia  general ?EBL: 50 ml ?Blood Administration: None ?Specimen:  Tricuspid valve vegetation ?  ?Indications: ?The patient admitted to the hospital with tricuspid valve endocarditis and MSSA bacteremia.  Due to ongoing drug use, the patient was not a good surgical candidate for valve replacement.  The patient did however have a large tricuspid valve vegetation and evidence of multiple septic pulmonary emboli.  Catheter-based debridement of the tricuspid valve vegetation was recommended. ?  ?Findings: ?Good results with debridement.  There is a residual subvalvular component of the vegetation which was not accessible.  This likely was part of the torn chordae ?  ?Operative Technique: ?After the risks, benefits and alternatives were thoroughly discussed, the patient was brought to the operative theatre.  Anesthesia was induced, and she was prepped and draped in normal sterile fashion.  An appropriate surgical pause was performed and preoperative antibiotics were dosed accordingly. ?  ?We began with ultrasound-guided cannulation of the right femoral vein using a micropuncture set.  We confirmed that our wire was in the IVC using fluoroscopy.  After systemically heparinizing the patient, the venotomy was then sequentially dilated over wire and our 18 French catheter was in place.   This catheter was then connected to the angiovac circuit. ?  ?Next we moved to the right internal jugular vein.  Using ultrasound guidance and a micropuncture set we accessed the vein.  Wire was then threaded down the SVC into the heart and down into the abdominal IVC.  The tract was then dilated sequentially using fluoroscopy.  In the 22 [place] dry seal sheath was then inserted.  After we confirmed therapeutic ACT, the ECMO circuit was initiated and we used the Angiovac to debride the tricuspid valve with TEE guidance. ?  ?After achieving an optimal result we discontinued our procedure, and returned the remaining blood from the Angiovac circuit to the patient.  The catheters were removed and the sites were closed with a pledgeted mattress suture.  Pressure was held while heparinization was reversed with protamine. ?  ?The patient tolerated the procedure without any immediate complications, and was transferred to the PACU in stable condition. ?  ?Lajuana Matte ? ?

## 2021-11-28 NOTE — Brief Op Note (Signed)
11/14/2021 - 11/28/2021 ? ?2:48 PM ? ?PATIENT:  Reginald Clarke  31 y.o. male ? ?PRE-OPERATIVE DIAGNOSIS:  Tricuspid Valve VEGETATION ?SEVERE Tricuspid Regurgitation ? ?POST-OPERATIVE DIAGNOSIS:  Tricuspid Valve VEGETATION ?SEVERE Tricuspid Regurgitation ? ?PROCEDURES:   ?-APPLICATION OF ANGIOVAC  ?-TRANSESOPHAGEAL ECHOCARDIOGRAM  ? ?SURGEON:  Corliss Skains, MD - Primary ? ?PHYSICIAN ASSISTANT: Esabella Stockinger ? ?ASSISTANTS: Ferne Coe, RN, Scrub Person  ? ?ANESTHESIA:   general ? ?EBL:  <23ml  ? ?BLOOD ADMINISTERED:none ? ?DRAINS: none  ? ?LOCAL MEDICATIONS USED:  NONE ? ?SPECIMEN:  Tricuspid valve vegetation ? ?DISPOSITION OF SPECIMEN:  PATHOLOGY ? ?COUNTS:  Correct ? ?DICTATION: .Dragon Dictation ? ?PLAN OF CARE: Admit to inpatient  ? ?PATIENT DISPOSITION:  PACU - hemodynamically stable. ?  ?Delay start of Pharmacological VTE agent (>24hrs) due to surgical blood loss or risk of bleeding: yes ? ?

## 2021-11-28 NOTE — Progress Notes (Signed)
?PROGRESS NOTE ? ? ? ?Reginald Clarke  [id number]:[id number] DOB: June 08, 1991 DOA: 11/14/2021 ?PCP: Pcp, No  ? ?  ?Brief Narrative:  ?Patient is a 31 years old male with past medical history of IV drug abuse, GERD, chronic pain on methadone, presented to the hospital with positive blood cultures.  Patient was recently diagnosed of having community-acquired pneumonia.  On this presentation, patient had MSSA bacteremia and further work-up showed tricuspid valve endocarditis with severe TR and septic pulmonary emboli.  TEE was performed on 11/23/2021 which showed large vegetation of the tricuspid valve.  ID and CT surgery followed the patient during hospitalization.  Currently on Ancef.  Plan for angio VAC on 11/28/2021.   ? ?Subjective: ?Today, patient was seen and examined at bedside.  Patient denies any chest pain, shortness of breath, fever, chills or rigor.  Awaiting for procedure today. ? ?Assessment & Plan: ? Principal Problem: ?  Bacteremia ?Active Problems: ?  Endocarditis of tricuspid valve ?  Chronic pain syndrome ?  CAP (community acquired pneumonia) ?  GERD (gastroesophageal reflux disease) ?  Tobacco abuse ?  Polysubstance abuse ([name]) ?  Hepatitis C, chronic ([name]) ?  Septic pulmonary embolism ([name]) ?  Methadone dependence ([name]) ? ?MSSA bacteremia with septic pulmonary emboli, tricuspid valve endocarditis ?-Repeat blood culture 4/12 negative continue Ancef.  Cardiology planning for angio echo 11/28/2021. ? ?Chronic back pain, chronic methadone dependence  ?-MRI negative for discitis, osteomyelitis, patient follows up at methadone clinic for methadone.  Currently on Toradol. ? ?Polysubstance abuse, tobacco abuse ?-UDS positive for THC, amphetamine ? ?Chronic hepatitis C ?-Follow-up with ID outpatient ? ?Anxiety ?-Xanax and hydroxyzine as needed ? ? ?DVT prophylaxis:  ?enoxaparin (LOVENOX) injection 40 mg Start: 11/24/21 1800 ? ?Code Status: Full code ? ?Family Communication:  ?Spoke with the patient at  bedside. ? ?Disposition Plan: Home ? ?Status is: Inpatient ? ?Remains inpatient appropriate because: IV antibiotics, angiovac 4/26 ? ?Antimicrobials:  ?Anti-infectives (From admission, onward)  ? ? Start     Dose/Rate Route Frequency Ordered Stop  ? 11/24/21 0000  ceFAZolin (ANCEF) IVPB 2g/100 mL premix       ? 2 g ?200 mL/hr over 30 Minutes Intravenous Every 8 hours 11/23/21 1950    ? 11/14/21 1400  ceFAZolin (ANCEF) injection 2 g  Status:  Discontinued       ? 2 g Intramuscular Every 8 hours 11/14/21 1050 11/14/21 1105  ? 11/14/21 1115  ceFAZolin (ANCEF) IVPB 2g/100 mL premix  Status:  Discontinued       ? 2 g ?200 mL/hr over 30 Minutes Intravenous Every 8 hours 11/14/21 1105 11/23/21 1950  ? 11/14/21 1100  vancomycin (VANCOREADY) IVPB 1500 mg/300 mL  Status:  Discontinued       ? 1,500 mg ?150 mL/hr over 120 Minutes Intravenous Every 12 hours 11/14/21 1047 11/14/21 1102  ? ?  ? ?Objective: ?Vitals:  ? 11/27/21 1609 11/27/21 2024 11/28/21 0522 11/28/21 0738  ?BP: 116/67 114/64 123/74 105/66  ?Pulse: 100 98 98 100  ?Resp: 16 17 16 17   ?Temp: 98.5 ?F (36.9 ?C) 98.1 ?F (36.7 ?C) 98.2 ?F (36.8 ?C) 97.7 ?F (36.5 ?C)  ?TempSrc: Oral Oral Oral Oral  ?SpO2: 100% 100% 100% 98%  ?Weight:      ?Height:      ? ? ?Intake/Output Summary (Last 24 hours) at 11/28/2021 0844 ?Last data filed at 11/28/2021 0221 ?Gross per 24 hour  ?Intake 300 ml  ?Output --  ?Net 300 ml  ? ? ?Filed Weights  ?  11/14/21 1023 11/14/21 2000  ?Weight: 70.8 kg 70.2 kg  ? ? ?Examination:  ?General:  Average built, not in obvious distress ?HENT:   No scleral pallor or icterus noted. Oral mucosa is moist.  ?Chest:  Clear breath sounds.  Diminished breath sounds bilaterally. No crackles or wheezes.  ?CVS: S1 &S2 heard.  Regular rate and rhythm. ?Abdomen: Soft, nontender, nondistended.  Bowel sounds are heard.   ?Extremities: No cyanosis, clubbing or edema.  Peripheral pulses are palpable. ?Psych: Alert, awake and oriented, normal mood ?CNS:  No cranial nerve  deficits.  Power equal in all extremities.   ?Skin: Warm and dry.  No rashes noted. ? ?Data Reviewed: I have personally reviewed following labs and imaging studies ? ?CBC: ?Recent Labs  ?Lab 11/23/21 ?0151  ?WBC 4.8  ?HGB 9.5*  ?HCT 28.6*  ?MCV 92.0  ?PLT 223  ? ? ?Basic Metabolic Panel: ?Recent Labs  ?Lab 11/23/21 ?0151 11/27/21 ?0040  ?NA 137 136  ?K 4.9 4.1  ?CL 101 100  ?CO2 30 32  ?GLUCOSE 80 110*  ?BUN 6 5*  ?CREATININE 0.69 0.78  ?CALCIUM 8.3* 8.5*  ? ? ?GFR: ?Estimated Creatinine Clearance: 134.1 mL/min (by C-G formula based on SCr of 0.78 mg/dL). ?Liver Function Tests: ?No results for input(s): AST, ALT, ALKPHOS, BILITOT, PROT, ALBUMIN in the last 168 hours. ?No results for input(s): LIPASE, AMYLASE in the last 168 hours. ?No results for input(s): AMMONIA in the last 168 hours. ?Coagulation Profile: ?Recent Labs  ?Lab 11/22/21 ?1443  ?INR 1.0  ? ? ?Cardiac Enzymes: ?No results for input(s): CKTOTAL, CKMB, CKMBINDEX, TROPONINI in the last 168 hours. ?BNP (last 3 results) ?No results for input(s): PROBNP in the last 8760 hours. ?HbA1C: ?No results for input(s): HGBA1C in the last 72 hours. ?CBG: ?No results for input(s): GLUCAP in the last 168 hours. ?Lipid Profile: ?No results for input(s): CHOL, HDL, LDLCALC, TRIG, CHOLHDL, LDLDIRECT in the last 72 hours. ?Thyroid Function Tests: ?No results for input(s): TSH, T4TOTAL, FREET4, T3FREE, THYROIDAB in the last 72 hours. ?Anemia Panel: ?No results for input(s): VITAMINB12, FOLATE, FERRITIN, TIBC, IRON, RETICCTPCT in the last 72 hours. ?Sepsis Labs: ?No results for input(s): PROCALCITON, LATICACIDVEN in the last 168 hours. ? ?No results found for this or any previous visit (from the past 240 hour(s)). ?  ? ? ?Radiology Studies: ?No results found. ? ? ? ?Scheduled Meds: ? enoxaparin (LOVENOX) injection  40 mg Subcutaneous Q24H  ? methadone  130 mg Oral Daily  ? And  ? methadone  5 mg Oral Daily  ? pantoprazole  40 mg Oral Daily  ? ?Continuous Infusions: ?   ceFAZolin (ANCEF) IV 2 g (11/28/21 0017)  ? ? ? LOS: 14 days  ? ? ?Joycelyn Das, MD ?[facility] ?11/28/2021, 8:44 AM  ? ?Available via Epic secure chat 7am-7pm ?After these hours, please refer to coverage provider listed on [email or web address] ? ?

## 2021-11-28 NOTE — Anesthesia Procedure Notes (Signed)
Arterial Line Insertion ?Start/End4/26/2023 12:26 PM, 11/28/2021 12:28 PM ?Performed by: Atilano Median, DO, anesthesiologist ? Patient location: Pre-op. ?Preanesthetic checklist: patient identified, IV checked, site marked, risks and benefits discussed, surgical consent, monitors and equipment checked, pre-op evaluation, timeout performed and anesthesia consent ?Lidocaine 1% used for infiltration ?Left, brachial was placed ?Catheter size: 20 G ?Hand hygiene performed , maximum sterile barriers used  and Seldinger technique used ? ?Attempts: 1 ?Procedure performed using ultrasound guided technique. ?Ultrasound Notes:anatomy identified, needle tip was noted to be adjacent to the nerve/plexus identified and no ultrasound evidence of intravascular and/or intraneural injection ?Following insertion, dressing applied. ?Post procedure assessment: normal and unchanged ? ?Post procedure complications: second provider assisted, unsuccessful attempts and local hematoma. ?Additional procedure comments: CRNA team unsuccessful radially multiple attempts. Single attempt self radially under U/S guidance flash unable to thread vessel 2/2 to small lumen and sclerosis. Single attempt brachial vessel under u/S guidance. ? ? ? ?

## 2021-11-28 NOTE — Progress Notes (Signed)
?   ?  [address] ?      [name] [address] ?            [phone]      ? ?No events ? ?Vitals:  ? 11/28/21 0738 11/28/21 1100  ?BP: 105/66 114/72  ?Pulse: 100 99  ?Resp: 17 18  ?Temp: 97.7 ?F (36.5 ?C) 99.1 ?F (37.3 ?C)  ?SpO2: 98% 99%  ? ?Alert NAD ?Sinus ?EWOB ? ?OR today for angiovac ? ?Corliss Skains ? ?

## 2021-11-28 NOTE — Progress Notes (Signed)
Patient arrived from pacu to 4e10, vital signs obtained and patient placed back on monitor. Patient with some stain marking to right neck dressing unchanged from pacu report. Pt right groin level 0 at this time. Will monitor. Hailee Hollick, Randall An ?RN  ?

## 2021-11-28 NOTE — Anesthesia Procedure Notes (Addendum)
Central Venous Catheter Insertion ?Performed by: Darral Dash, DO, anesthesiologist ?Start/End4/26/2023 12:15 PM, 11/28/2021 12:25 PM ?Patient location: Pre-op. ?Preanesthetic checklist: patient identified, IV checked, site marked, risks and benefits discussed, surgical consent, monitors and equipment checked, pre-op evaluation, timeout performed and anesthesia consent ?Position: Trendelenburg ?Lidocaine 1% used for infiltration and patient sedated ?Hand hygiene performed  and maximum sterile barriers used  ?Catheter size: 8.5 Fr ?Central line was placed.Sheath introducer ?Procedure performed using ultrasound guided technique. ?Ultrasound Notes:anatomy identified, needle tip was noted to be adjacent to the nerve/plexus identified, no ultrasound evidence of intravascular and/or intraneural injection and image(s) printed for medical record ?Attempts: 1 ?Following insertion, line sutured, dressing applied and Biopatch. ?Post procedure assessment: blood return through all ports, free fluid flow and no air ? ?Patient tolerated the procedure well with no immediate complications. ? ? ? ? ?

## 2021-11-28 NOTE — Progress Notes (Signed)
? ?[facility] Infectious Diseases Follow Up Note ? ?Patient Identification: ?Patient Name: Reginald Clarke MRN: [id number] Admit Date: 11/14/2021 10:24 AM ?Age: 31 y.o.Today's Date: 11/28/2021 ? ?Reason for Visit: follow up on endocarditis  ? ?Principal Problem: ?  Bacteremia ?Active Problems: ?  Chronic pain syndrome ?  CAP (community acquired pneumonia) ?  GERD (gastroesophageal reflux disease) ?  Tobacco abuse ?  Endocarditis of tricuspid valve ?  Polysubstance abuse ([name]) ?  Hepatitis C, chronic ([name]) ?  Septic pulmonary embolism ([name]) ?  Methadone dependence ([name]) ? ? ?Antibiotics: ?Cefazolin 4/12-c ? ?Lines/Hardwares:  ? ?Interval Events: continues to be afebrile ? ?Assessment ?MSSA native TV endocarditis with multiple vegetations complicated with septic pulmonary emboli and rt pleural effusion ( no fluid to drain per IR 4/17) ?Severe TVR ?-     Planning for angiovac by CVTS today ? ?Back pain with 4/13 MRI with reactive edema in L4-L5 and L5-S1 facets - Back pain seems to be stable ? ?Hepatitis C  ? ?Polysubstance [place] - prior h/o heroin use and currently on methadone with most recent h/o methamphetamine use ? ?Recommendations ?Continue cefazolin as is ?HCV tx as OP ?Fu OR note.  Please send cultures and path as appropriate when goes to OR ?Following intermittently ? ?Rest of the management as per the primary team. ?Thank you for the consult. Please page with pertinent questions or concerns. ? ?______________________________________________________________________ ?Subjective ?patient seen and examined at the bedside.  ?Denies any complaints  ? ?Vitals ?BP 105/66 (BP Location: Right Arm)   Pulse 100   Temp 97.7 ?F (36.5 ?C) (Oral)   Resp 17   Ht 5\' 10"  (1.778 m)   Wt 70.2 kg   SpO2 98%   BMI 22.21 kg/m?  ?  ?  ?Physical Exam ?Constitutional:  sitting up in the bed  ?   Comments:  ? ?Cardiovascular:  ?   Rate and Rhythm: Normal rate and regular rhythm.  ?    Heart sounds:  ? ?Pulmonary:  ?   Effort: Pulmonary effort is normal on room air ?   Comments:  ? ?Abdominal:  ?   Palpations: Abdomen is soft.  ?   Tenderness: non distended and non tender  ? ?Musculoskeletal:     ?   General: No vertebral tenderness  ? ?Skin: ?   Comments:  ? ?Neurological:  ?   General: Grossly non focal, awake, alert and oriented  ? ?Psychiatric:     ?   Mood and Affect: Mood normal.  ? ?Pertinent Microbiology ?Results for orders placed or performed during the hospital encounter of 11/14/21  ?Blood culture (routine x 2)     Status: None  ? Collection Time: 11/14/21 12:35 PM  ? Specimen: BLOOD LEFT FOREARM  ?Result Value Ref Range Status  ? Specimen Description BLOOD LEFT FOREARM  Final  ? Special Requests   Final  ?  BOTTLES DRAWN AEROBIC AND ANAEROBIC Blood Culture adequate volume  ? Culture   Final  ?  NO GROWTH 6 DAYS ?Performed at [facility], [address], [place], [place] ?  ? Report Status 11/20/2021 FINAL  Final  ?Blood culture (routine x 2)     Status: None  ? Collection Time: 11/14/21 12:57 PM  ? Specimen: BLOOD LEFT ARM  ?Result Value Ref Range Status  ? Specimen Description BLOOD LEFT ARM  Final  ? Special Requests   Final  ?  BOTTLES DRAWN AEROBIC AND ANAEROBIC Blood Culture results may not be optimal  due to an excessive volume of blood received in culture bottles  ? Culture   Final  ?  NO GROWTH 6 DAYS ?Performed at [facility], [address] [place], [place] [address] ?  ? Report Status 11/20/2021 FINAL  Final  ?MRSA Next Gen by PCR, Nasal     Status: None  ? Collection Time: 11/15/21 10:48 AM  ? Specimen: Nasal Mucosa; Nasal Swab  ?Result Value Ref Range Status  ? MRSA by PCR Next Gen NOT DETECTED NOT DETECTED Final  ?  Comment: (NOTE) ?The GeneXpert MRSA Assay (FDA approved for NASAL specimens only), ?is one component of a comprehensive MRSA colonization surveillance ?program. It is not intended to diagnose MRSA infection nor to guide ?or monitor treatment for MRSA  infections. ?Test performance is not FDA approved in patients less than 2 years ?old. ?Performed at [facility], [address] [place], [place] [address] ?  ? ? ? ?Pertinent Lab. ? ?  Latest Ref Rng & Units 11/23/2021  ?  1:51 AM 11/17/2021  ?  5:09 AM 11/15/2021  ?  5:03 AM  ?CBC  ?WBC 4.0 - 10.5 K/uL 4.8   16.4   12.2    ?Hemoglobin 13.0 - 17.0 g/dL 9.5   55.7   32.2    ?Hematocrit 39.0 - 52.0 % 28.6   34.7   32.7    ?Platelets 150 - 400 K/uL 223   412   289    ? ? ?  Latest Ref Rng & Units 11/27/2021  ? 12:40 AM 11/23/2021  ?  1:51 AM 11/16/2021  ?  2:30 PM  ?CMP  ?Glucose 70 - 99 mg/dL 025   80   81    ?BUN 6 - 20 mg/dL 5   6   9     ?Creatinine 0.61 - 1.24 mg/dL   4.27   0.62    ?Sodium 135 - 145 mmol/L 136   137   134    ?Potassium 3.5 - 5.1 mmol/L 4.1   4.9   4.1    ?Chloride 98 - 111 mmol/L 100   101   98    ?CO2 22 - 32 mmol/L 32   30   29    ?Calcium 8.9 - 10.3 mg/dL 8.5   8.3   8.7    ? ? ? ?Pertinent Imaging today ?Plain films and CT images have been personally visualized and interpreted; radiology reports have been reviewed. Decision making incorporated into the Impression / Recommendations. ? ?No results found. ? ?I spent 52 minutes for this patient encounter including review of prior medical records, coordination of care with primary/other specialist with greater than 50% of time being face to face/counseling and discussing diagnostics/treatment plan with the patient/family. ? ?Electronically signed by:  ? ?3.76, MD ?Infectious Disease Physician ?[facility] ?Pager: [phone] ? ?

## 2021-11-28 NOTE — Progress Notes (Signed)
?  Echocardiogram ?Echocardiogram Transesophageal has been performed. ? ?Reginald Clarke ?11/28/2021, 1:58 PM ?

## 2021-11-28 NOTE — Anesthesia Preprocedure Evaluation (Addendum)
Anesthesia Evaluation  ?Patient identified by MRN, date of birth, ID band ?Patient awake ? ? ? ?Reviewed: ?Allergy & Precautions, NPO status , Patient's Chart, lab work & pertinent test results ? ?Airway ?Mallampati: II ? ?TM Distance: >3 FB ?Neck ROM: Full ? ? ? Dental ? ?(+) Poor Dentition ?  ?Pulmonary ?neg pulmonary ROS, Current Smoker and Patient abstained from smoking.,  ?  ?Pulmonary exam normal ? ? ? ? ? ? ? Cardiovascular ?+ Valvular Problems/Murmurs (severe TR 2/2 endocarditis )  ?Rhythm:Regular Rate:Normal ? ? ?  ?Neuro/Psych ?negative neurological ROS ? negative psych ROS  ? GI/Hepatic ?GERD  ,(+)  ?  ? substance abuse (on chronic Methadone) ? cocaine use and IV drug use, Hepatitis -, C  ?Endo/Other  ? ? Renal/GU ?negative Renal ROS  ?negative genitourinary ?  ?Musculoskeletal ?negative musculoskeletal ROS ?(+)  ? Abdominal ?Normal abdominal exam  (+)   ?Peds ? Hematology ?  ?Anesthesia Other Findings ? ? Reproductive/Obstetrics ? ?  ? ? ? ? ? ? ? ? ? ? ? ? ? ?  ?  ? ? ? ? ? ? ? ?Anesthesia Physical ?Anesthesia Plan ? ?ASA: 4 ? ?Anesthesia Plan: General  ? ?Post-op Pain Management:   ? ?Induction: Intravenous ? ?PONV Risk Score and Plan: 1 and Ondansetron, Dexamethasone, Midazolam and Treatment may vary due to age or medical condition ? ?Airway Management Planned: Mask and Oral ETT ? ?Additional Equipment: Arterial line, CVP, TEE, Ultrasound Guidance Line Placement and 3D TEE ? ?Intra-op Plan:  ? ?Post-operative Plan: Extubation in OR ? ?Informed Consent: I have reviewed the patients History and Physical, chart, labs and discussed the procedure including the risks, benefits and alternatives for the proposed anesthesia with the patient or authorized representative who has indicated his/her understanding and acceptance.  ? ? ? ?Dental advisory given ? ?Plan Discussed with: CRNA ? ?Anesthesia Plan Comments: (- TEE FOR MONITORING PURPOSES ? ?Lab Results ?     Component                 Value               Date                 ?     WBC                      4.8                 11/23/2021           ?     HGB                      9.5 (L)             11/23/2021           ?     HCT                      28.6 (L)            11/23/2021           ?     MCV                      92.0                11/23/2021           ?  PLT                      223                 11/23/2021           ?Lab Results ?     Component                Value               Date                 ?     NA                       136                 11/27/2021           ?     K                        4.1                 11/27/2021           ?     CO2                      32                  11/27/2021           ?     GLUCOSE                  110 (H)             11/27/2021           ?     BUN                      5 (L)               11/27/2021           ?     CREATININE               0.78                11/27/2021           ?     CALCIUM                  8.5 (L)             11/27/2021           ?     GFRNONAA                 >60                 11/27/2021           ? ?ECHO 04/23: ??1. The tricuspid valve is abnormal; there are vegetations on all  ?tricuspid leaflets. Largest in 2.0 cm X 0.6 cm on the anterior leaflet.  ?0.86 X 0.55 cm vegetation on the septal leaflet. 0.58 cm X 0.67 cm  ?vegetation on the posterior leaflet. Tricuspid  ?valve regurgitation is severe and eccentric.  ??2. Left ventricular ejection fraction, by estimation, is 60 to 65%. The  ?left ventricle has normal function.  ??3. Right  ventricular systolic function is normal. The right ventricular  ?size is normal.  ??4. No left atrial/left atrial appendage thrombus was detected.  ??5. The mitral valve is grossly normal, with bileaflet billowing but no  ?vegetation. No evidence of significant mitral valve regurgitation. No  ?evidence of mitral stenosis.  ??6. The aortic valve is tricuspid. Aortic valve regurgitation is not  ?visualized. No aortic stenosis is  present. )  ? ? ? ? ?Anesthesia Quick Evaluation ? ?

## 2021-11-28 NOTE — Anesthesia Postprocedure Evaluation (Signed)
Anesthesia Post Note ? ?Patient: Reginald Clarke ? ?Procedure(s) Performed: APPLICATION OF ANGIOVAC ?TRANSESOPHAGEAL ECHOCARDIOGRAM (TEE) ? ?  ? ?Patient location during evaluation: PACU ?Anesthesia Type: General ?Level of consciousness: awake and alert ?Pain management: pain level controlled ?Vital Signs Assessment: post-procedure vital signs reviewed and stable ?Respiratory status: spontaneous breathing, nonlabored ventilation, respiratory function stable and patient connected to nasal cannula oxygen ?Cardiovascular status: blood pressure returned to baseline and stable ?Postop Assessment: no apparent nausea or vomiting ?Anesthetic complications: no ? ? ?No notable events documented. ? ?Last Vitals:  ?Vitals:  ? 11/28/21 1600 11/28/21 1617  ?BP: 112/67 124/69  ?Pulse: (!) 111   ?Resp: 18 18  ?Temp:    ?SpO2: 100%   ?  ?Last Pain:  ?Vitals:  ? 11/28/21 1617  ?TempSrc:   ?PainSc: 10-Worst pain ever  ? ? ?  ?  ?  ?  ?  ?  ? ?March Rummage Pawan Knechtel ? ? ? ? ?

## 2021-11-28 NOTE — Anesthesia Procedure Notes (Signed)
Procedure Name: Intubation ?Date/Time: 11/28/2021 1:01 PM ?Performed by: Erick Colace, CRNA ?Pre-anesthesia Checklist: Patient identified, Emergency Drugs available, Suction available and Patient being monitored ?Patient Re-evaluated:Patient Re-evaluated prior to induction ?Oxygen Delivery Method: Circle system utilized ?Preoxygenation: Pre-oxygenation with 100% oxygen ?Induction Type: IV induction ?Ventilation: Mask ventilation without difficulty ?Laryngoscope Size: Mac and 3 ?Grade View: Grade I ?Tube type: Oral ?Tube size: 7.5 mm ?Number of attempts: 1 ?Airway Equipment and Method: Stylet and Oral airway ?Placement Confirmation: ETT inserted through vocal cords under direct vision, positive ETCO2 and breath sounds checked- equal and bilateral ?Secured at: 22 cm ?Tube secured with: Tape ?Dental Injury: Teeth and Oropharynx as per pre-operative assessment  ? ? ? ? ?

## 2021-11-29 ENCOUNTER — Encounter (HOSPITAL_COMMUNITY): Payer: Self-pay | Admitting: Thoracic Surgery (Cardiothoracic Vascular Surgery)

## 2021-11-29 DIAGNOSIS — R7881 Bacteremia: Secondary | ICD-10-CM | POA: Diagnosis not present

## 2021-11-29 LAB — ACID FAST SMEAR (AFB, MYCOBACTERIA): Acid Fast Smear: NEGATIVE

## 2021-11-29 LAB — CBC
HCT: 25.4 % — ABNORMAL LOW (ref 39.0–52.0)
Hemoglobin: 8.4 g/dL — ABNORMAL LOW (ref 13.0–17.0)
MCH: 30.1 pg (ref 26.0–34.0)
MCHC: 33.1 g/dL (ref 30.0–36.0)
MCV: 91 fL (ref 80.0–100.0)
Platelets: 208 10*3/uL (ref 150–400)
RBC: 2.79 MIL/uL — ABNORMAL LOW (ref 4.22–5.81)
RDW: 13.4 % (ref 11.5–15.5)
WBC: 8.2 10*3/uL (ref 4.0–10.5)
nRBC: 0 % (ref 0.0–0.2)

## 2021-11-29 LAB — COMPREHENSIVE METABOLIC PANEL
ALT: 9 U/L (ref 0–44)
AST: 15 U/L (ref 15–41)
Albumin: 2 g/dL — ABNORMAL LOW (ref 3.5–5.0)
Alkaline Phosphatase: 54 U/L (ref 38–126)
Anion gap: 4 — ABNORMAL LOW (ref 5–15)
BUN: <5 mg/dL — ABNORMAL LOW (ref 6–20)
CO2: 29 mmol/L (ref 22–32)
Calcium: 8.5 mg/dL — ABNORMAL LOW (ref 8.9–10.3)
Chloride: 104 mmol/L (ref 98–111)
Creatinine, Ser: 0.56 mg/dL — ABNORMAL LOW (ref 0.61–1.24)
GFR, Estimated: >60 mL/min (ref >60)
Glucose, Bld: 93 mg/dL (ref 70–99)
Potassium: 4.9 mmol/L (ref 3.5–5.1)
Sodium: 137 mmol/L (ref 135–145)
Total Bilirubin: 0.3 mg/dL (ref 0.3–1.2)
Total Protein: 6.9 g/dL (ref 6.5–8.1)

## 2021-11-29 LAB — MAGNESIUM: Magnesium: 2 mg/dL (ref 1.7–2.4)

## 2021-11-29 MED ORDER — HYDROMORPHONE HCL 1 MG/ML IJ SOLN
0.5000 mg | INTRAMUSCULAR | Status: DC | PRN
  Administered 2021-11-30: 0.5 mg via INTRAVENOUS
  Filled 2021-11-29 (×2): qty 0.5

## 2021-11-29 MED ORDER — CHLORHEXIDINE GLUCONATE CLOTH 2 % EX PADS
6.0000 | MEDICATED_PAD | Freq: Every day | CUTANEOUS | Status: DC
  Administered 2021-11-29 – 2021-12-02 (×4): 6 via TOPICAL

## 2021-11-29 NOTE — Progress Notes (Signed)
? ?PROGRESS NOTE ? ?Reginald Clarke JKD:[id number] DOB: 09-30-1990 DOA: 11/14/2021 ?PCP: Pcp, No ? ?HPI/Recap of past 24 hours: ? Patient is a 31 years old male with past medical history of IV drug abuse, GERD, chronic pain on methadone, presented to the hospital with positive blood cultures.  Patient was recently diagnosed of having community-acquired pneumonia.  On this presentation, patient had MSSA bacteremia and further work-up showed tricuspid valve endocarditis with severe TR and septic pulmonary emboli.  TEE was performed on 11/23/2021 which showed large vegetation of the tricuspid valve.  ID and CT surgery followed the patient during hospitalization.  Currently on Ancef.  Plan for angio VAC on 11/28/2021.  ? ?11/29/21: Reports pain in his lower back and anxiety. ? ?Assessment/Plan: ?Principal Problem: ?  Bacteremia ?Active Problems: ?  Endocarditis of tricuspid valve ?  Chronic pain syndrome ?  CAP (community acquired pneumonia) ?  GERD (gastroesophageal reflux disease) ?  Tobacco abuse ?  Polysubstance abuse ([name]) ?  Hepatitis C, chronic ([name]) ?  Septic pulmonary embolism ([name]) ?  Methadone dependence ([name]) ? ? ?MSSA bacteremia with septic pulmonary emboli, tricuspid valve endocarditis ?-Repeat blood culture 4/12 negative continue Ancef.  Post angio echo 11/28/2021. ?  ?Chronic back pain, chronic methadone dependence  ?-MRI negative for discitis, osteomyelitis, patient follows up at methadone clinic for methadone.  Continue analgesics. ? ?Polysubstance abuse, tobacco abuse ?-UDS positive for THC, amphetamine ? ?Chronic hepatitis C ?-Follow-up with ID outpatient ?  ?Anxiety ?-Xanax and hydroxyzine as needed, continue ? ?  ?DVT prophylaxis:  ?enoxaparin (LOVENOX) injection 40 mg Start: 11/24/21 1800 ?  ?Code Status: Full code ?  ?Family Communication:  ?Spoke with the patient at bedside.  Girlfriend was present at bedside. ?  ?Disposition Plan: Home ?  ?Status is: Inpatient ?  ?Remains inpatient appropriate because:  IV antibiotics, angiovac 4/26 ?  ?Antimicrobials:  ?Anti-infectives (From admission, onward)  ?  ?  ?  Start     Dose/Rate Route Frequency Ordered Stop  ?  11/24/21 0000   ceFAZolin (ANCEF) IVPB 2g/100 mL premix       ? 2 g ?200 mL/hr over 30 Minutes Intravenous Every 8 hours 11/23/21 1950    ?  11/14/21 1400   ceFAZolin (ANCEF) injection 2 g  Status:  Discontinued       ? 2 g Intramuscular Every 8 hours 11/14/21 1050 11/14/21 1105  ?  11/14/21 1115   ceFAZolin (ANCEF) IVPB 2g/100 mL premix  Status:  Discontinued       ? 2 g ?200 mL/hr over 30 Minutes Intravenous Every 8 hours 11/14/21 1105 11/23/21 1950  ?  11/14/21 1100   vancomycin (VANCOREADY) IVPB 1500 mg/300 mL  Status:  Discontinued       ? 1,500 mg ?150 mL/hr over 120 Minutes Intravenous Every 12 hours 11/14/21 1047 11/14/21 1102  ?  ?   ?  ?  ? ? ? ? ?Objective: ?Vitals:  ? 11/29/21 0315 11/29/21 0830 11/29/21 1137 11/29/21 1724  ?BP: 112/71 106/62 113/69 102/65  ?Pulse: 87 (!) 101 99 99  ?Resp: 16 16 18 18   ?Temp: 98.1 ?F (36.7 ?C) 98.4 ?F (36.9 ?C)  97.8 ?F (36.6 ?C)  ?TempSrc: Oral Oral  Oral  ?SpO2: 98% 100% 100% 98%  ?Weight:      ?Height:      ? ? ?Intake/Output Summary (Last 24 hours) at 11/29/2021 1754 ?Last data filed at 11/29/2021 0209 ?Gross per 24 hour  ?Intake 300 ml  ?Output --  ?  Net 300 ml  ? ?Filed Weights  ? 11/14/21 1023 11/14/21 2000 11/28/21 1100  ?Weight: 70.8 kg 70.2 kg 68 kg  ? ? ?Exam: ? ?General: 31 y.o. year-old male well developed well nourished in no acute distress.  Alert and oriented x3. ?Cardiovascular: Regular rate and rhythm with no rubs or gallops.  No thyromegaly or JVD noted.   ?Respiratory: Clear to auscultation with no wheezes or rales. Good inspiratory effort. ?Abdomen: Soft nontender nondistended with normal bowel sounds x4 quadrants. ?Musculoskeletal: No lower extremity edema. 2/4 pulses in all 4 extremities. ?Skin: No ulcerative lesions noted or rashes, ?Psychiatry: Mood is appropriate for condition and  setting ? ? ?Data Reviewed: ?CBC: ?Recent Labs  ?Lab 11/23/21 ?0151 11/28/21 ?1305 11/29/21 ?0207  ?WBC 4.8  --  8.2  ?HGB 9.5* 8.8* 8.4*  ?HCT 28.6* 26.0* 25.4*  ?MCV 92.0  --  91.0  ?PLT 223  --  208  ? ?Basic Metabolic Panel: ?Recent Labs  ?Lab 11/23/21 ?0151 11/27/21 ?0040 11/28/21 ?1305 11/29/21 ?0207  ?NA 137 136 139 137  ?K 4.9 4.1 4.2 4.9  ?CL 101 100 98 104  ?CO2 30 32  --  29  ?GLUCOSE 80 110* 82 93  ?BUN 6 5* 6 <5*  ?CREATININE 0.69 0.78 0.40* 0.56*  ?CALCIUM 8.3* 8.5*  --  8.5*  ?MG  --   --   --  2.0  ? ?GFR: ?Estimated Creatinine Clearance: 129.9 mL/min (A) (by C-G formula based on SCr of 0.56 mg/dL (L)). ?Liver Function Tests: ?Recent Labs  ?Lab 11/29/21 ?0207  ?AST 15  ?ALT 9  ?ALKPHOS 54  ?BILITOT 0.3  ?PROT 6.9  ?ALBUMIN 2.0*  ? ?No results for input(s): LIPASE, AMYLASE in the last 168 hours. ?No results for input(s): AMMONIA in the last 168 hours. ?Coagulation Profile: ?No results for input(s): INR, PROTIME in the last 168 hours. ?Cardiac Enzymes: ?No results for input(s): CKTOTAL, CKMB, CKMBINDEX, TROPONINI in the last 168 hours. ?BNP (last 3 results) ?No results for input(s): PROBNP in the last 8760 hours. ?HbA1C: ?No results for input(s): HGBA1C in the last 72 hours. ?CBG: ?No results for input(s): GLUCAP in the last 168 hours. ?Lipid Profile: ?No results for input(s): CHOL, HDL, LDLCALC, TRIG, CHOLHDL, LDLDIRECT in the last 72 hours. ?Thyroid Function Tests: ?No results for input(s): TSH, T4TOTAL, FREET4, T3FREE, THYROIDAB in the last 72 hours. ?Anemia Panel: ?No results for input(s): VITAMINB12, FOLATE, FERRITIN, TIBC, IRON, RETICCTPCT in the last 72 hours. ?Urine analysis: ?   ?Component Value Date/Time  ? COLORURINE AMBER (A) 11/10/2021 2301  ? APPEARANCEUR HAZY (A) 11/10/2021 2301  ? LABSPEC 1.016 11/10/2021 2301  ? PHURINE 6.0 11/10/2021 2301  ? GLUCOSEU NEGATIVE 11/10/2021 2301  ? HGBUR SMALL (A) 11/10/2021 2301  ? BILIRUBINUR NEGATIVE 11/10/2021 2301  ? KETONESUR NEGATIVE 11/10/2021  2301  ? PROTEINUR 30 (A) 11/10/2021 2301  ? NITRITE NEGATIVE 11/10/2021 2301  ? LEUKOCYTESUR MODERATE (A) 11/10/2021 2301  ? ?Sepsis Labs: ?@[email or web address] (procalcitonin:4,lacticidven:4) ? ?) ?Recent Results (from the past 240 hour(s))  ?Aerobic/Anaerobic Culture w Gram Stain (surgical/deep wound)     Status: None (Preliminary result)  ? Collection Time: 11/28/21  2:34 PM  ? Specimen: Soft Tissue, Other  ?Result Value Ref Range Status  ? Specimen Description TISSUE  Final  ? Special Requests TRICUSPID VEGETATION SPEC A  Final  ? Gram Stain   Final  ?  RARE WBC PRESENT,BOTH PMN AND MONONUCLEAR ?NO ORGANISMS SEEN ?  ? Culture   Final  ?  FEW STAPHYLOCOCCUS AUREUS ?SUSCEPTIBILITIES TO FOLLOW ?CRITICAL RESULT CALLED TO, READ BACK BY AND VERIFIED WITH: Donnetta Hutching RN, AT [phone] 11/29/21 D.VANHOOK REGARDING CULTURE GROWTH ?Performed at [facility], [address], [place], [place] [address] ?  ? Report Status PENDING  Incomplete  ?Acid Fast Smear (AFB)     Status: None  ? Collection Time: 11/28/21  2:34 PM  ? Specimen: Soft Tissue, Other  ?Result Value Ref Range Status  ? AFB Specimen Processing Concentration  Final  ? Acid Fast Smear Negative  Final  ?  Comment: (NOTE) ?Performed At: [facility] ?[address] [place], [place] [id number] ?Jolene Schimke MD QZ:[id number] ?  ? Source (AFB) TRICUSPID VEGETATION  Final  ?  Comment: Performed at [facility], [address] [place], [place] [address]  ?  ? ? ?Studies: ?No results found. ? ?Scheduled Meds: ? enoxaparin (LOVENOX) injection  40 mg Subcutaneous Q24H  ? methadone  130 mg Oral Daily  ? And  ? methadone  5 mg Oral Daily  ? pantoprazole  40 mg Oral Daily  ? ? ?Continuous Infusions: ?  ceFAZolin (ANCEF) IV 2 g (11/29/21 1552)  ? ? ? LOS: 15 days  ? ? ? ?Darlin Drop, MD ?[facility] ?Pager [phone] ? ?If 7PM-7AM, please contact night-coverage [email or web address] ?Password [email or web address] ?11/29/2021, 5:54 PM  ?  ?

## 2021-11-29 NOTE — Progress Notes (Signed)
? ?   ?  [address] ?      [place] [address] ?            [phone]   ? ?  ?1 Day Post-Op Procedure(s) (LRB): ?APPLICATION OF ANGIOVAC (N/A) ?TRANSESOPHAGEAL ECHOCARDIOGRAM (TEE) (N/A) ? ?Subjective: ? ?Patient states he is a little sore.  Has no other complaints.  ? ?Objective: ?Vital signs in last 24 hours: ?Temp:  [98 ?F (36.7 ?C)-99.1 ?F (37.3 ?C)] 98.1 ?F (36.7 ?C) (04/27 0315) ?Pulse Rate:  [87-112] 87 (04/27 0315) ?Cardiac Rhythm: Sinus tachycardia (04/26 2034) ?Resp:  [13-18] 16 (04/27 0315) ?BP: (81-124)/(52-72) 112/71 (04/27 0315) ?SpO2:  [95 %-100 %] 98 % (04/27 0315) ?Arterial Line BP: (105-129)/(50-73) 126/73 (04/26 2300) ?Weight:  [68 kg] 68 kg (04/26 1100) ? ?Intake/Output from previous day: ?04/26 0701 - 04/27 0700 ?In: 1640 [P.O.:240; I.V.:1100; IV Piggyback:300] ?Out: 20 [Blood:20] ? ?General appearance: alert, cooperative, and no distress ?Heart: regular rate and rhythm ?Lungs: clear to auscultation bilaterally ?Abdomen: soft, non-tender; bowel sounds normal; no masses,  no organomegaly ?Extremities: extremities normal, atraumatic, no cyanosis or edema ?Wound: clean and dry, no evidence of hematoma ? ?Lab Results: ?Recent Labs  ?  11/28/21 ?1305 11/29/21 ?0207  ?WBC  --  8.2  ?HGB 8.8* 8.4*  ?HCT 26.0* 25.4*  ?PLT  --  208  ? ?BMET:  ?Recent Labs  ?  11/27/21 ?0040 11/28/21 ?1305 11/29/21 ?0207  ?NA 136 139 137  ?K 4.1 4.2 4.9  ?CL 100 98 104  ?CO2 32  --  29  ?GLUCOSE 110* 82 93  ?BUN 5* 6 <5*  ?CREATININE 0.78 0.40* 0.56*  ?CALCIUM 8.5*  --  8.5*  ?  ?PT/INR: No results for input(s): LABPROT, INR in the last 72 hours. ?ABG ?   ?Component Value Date/Time  ? TCO2 34 (H) 11/28/2021 1305  ? ?CBG (last 3)  ?No results for input(s): GLUCAP in the last 72 hours. ? ?Assessment/Plan: ?S/P Procedure(s) (LRB): ?APPLICATION OF ANGIOVAC (N/A) ?TRANSESOPHAGEAL ECHOCARDIOGRAM (TEE) (N/A) ? ?CV- hemodynamically stable ?Endocarditis- on ABX per primary, ID recs ?S/p Angiovac- no evidence of  hematoma, will remove sutures tomorrow ? ? LOS: 15 days  ? ? ?Ellwood Handler, PA-C ?11/29/2021 ? ? ?

## 2021-11-30 ENCOUNTER — Other Ambulatory Visit (HOSPITAL_COMMUNITY): Payer: Self-pay

## 2021-11-30 DIAGNOSIS — R7881 Bacteremia: Secondary | ICD-10-CM | POA: Diagnosis not present

## 2021-11-30 NOTE — Progress Notes (Addendum)
? ?   ?  [address] ?      [name] [address] ?            [phone]   ? ?  ?1 Day Post-Op Procedure(s) (LRB): ?APPLICATION OF ANGIOVAC (N/A) ?TRANSESOPHAGEAL ECHOCARDIOGRAM (TEE) (N/A) ? ?Subjective: ? ?Patient states he is a little sore.  Has no other complaints.  ? ?Objective: ?Vital signs in last 24 hours: ?Temp:  [97.4 ?F (36.3 ?C)-98.4 ?F (36.9 ?C)] 98.4 ?F (36.9 ?C) (04/28 0455) ?Pulse Rate:  [96-101] 96 (04/27 2338) ?Cardiac Rhythm: Sinus tachycardia (04/27 1950) ?Resp:  [16-19] 16 (04/28 0455) ?BP: (92-113)/(45-69) 99/60 (04/28 0459) ?SpO2:  [98 %-100 %] 100 % (04/28 0455) ? ?Intake/Output from previous day: ?04/27 0701 - 04/28 0700 ?In: 300 [IV Piggyback:300] ?Out: -  ? ? ?Heart: RRR ?Lungs: Clear to auscultation bilaterally ?Extremities: No LE edema ?Wound: Right neck and right groin dressings removed and wounds are clean and dry, no evidence of hematoma ? ?Lab Results: ?Recent Labs  ?  11/28/21 ?1305 11/29/21 ?0207  ?WBC  --  8.2  ?HGB 8.8* 8.4*  ?HCT 26.0* 25.4*  ?PLT  --  208  ? ? ?BMET:  ?Recent Labs  ?  11/28/21 ?1305 11/29/21 ?0207  ?NA 139 137  ?K 4.2 4.9  ?CL 98 104  ?CO2  --  29  ?GLUCOSE 82 93  ?BUN 6 <5*  ?CREATININE 0.40* 0.56*  ?CALCIUM  --  8.5*  ? ?  ?PT/INR: No results for input(s): LABPROT, INR in the last 72 hours. ?ABG ?   ?Component Value Date/Time  ? TCO2 34 (H) 11/28/2021 1305  ? ?CBG (last 3)  ?No results for input(s): GLUCAP in the last 72 hours. ? ?Assessment/Plan: ?S/P Procedure(s) (LRB): ?APPLICATION OF ANGIOVAC (N/A) ?TRANSESOPHAGEAL ECHOCARDIOGRAM (TEE) (N/A) ? ?CV- SR ?Pulmonary-on room air. ?3. ID-on Cefazolin. OR culture show Staph Aureus for TV endocarditis. ?4. Anemia-Last H and H 8.4 and 25.4 ?5. On Lovenox for DVT prophylaxis ?6. Remove sutures, central line ? ?Doree Fudge PA-C ?7:38 am 11/30/2021 ?

## 2021-11-30 NOTE — Progress Notes (Signed)
? ?[facility] Infectious Diseases Follow Up Note ? ?Patient Identification: ?Patient Name: Reginald Clarke MRN: [id number] [place] Date: 11/14/2021 10:24 AM ?Age: 31 y.o.Today's Date: 11/30/2021 ? ?Reason for Visit: follow up on endocarditis  ? ?Principal Problem: ?  Bacteremia ?Active Problems: ?  Chronic pain syndrome ?  CAP (community acquired pneumonia) ?  GERD (gastroesophageal reflux disease) ?  Tobacco abuse ?  Endocarditis of tricuspid valve ?  Polysubstance abuse ([place]) ?  Hepatitis C, chronic ([place]) ?  Septic pulmonary embolism ([place]) ?  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 ? ?Antibiotics: ?Cefazolin 4/12-c ? ?Lines/Hardwares:  ? ?Interval Events: continues to be afebrile ? ?Assessment ?MSSA native TV endocarditis with multiple vegetations complicated with septic pulmonary emboli and rt pleural effusion ( no fluid to drain per IR 4/17) ?/ which  ?Severe TVR ?-     s/p debridement of rt atrial mass and TV vegetation. OR findings Good results with debridement.  There is a residual subvalvular component of the vegetation which was not accessible.  This likely was part of the torn chordae ?  ? ?Back pain with 4/13 MRI with reactive edema in L4-L5 and L5-S1 facets - Back pain seems to be stable ? ?Hepatitis C  ? ?Polysubstance [place] - prior h/o heroin use and currently on methadone with most recent h/o methamphetamine use ? ?Recommendations ?Continue cefazolin as is ?OR cultures with staph aureus - likely MSSA. Fu sensitivities ?Post op care per CVTS ?CVC to be removed if no longer required ?HCV tx as OP ?Have discussed alternatives like weekly long acting injections as an alternative option given h/o IVDU when ready for discharge  ? ?Dr Baxter Flattery covering this weekend. Will be back Monday for final recs  ? ?Rest of the management as per the primary team. ?Thank you for the consult. Please page with pertinent questions or  concerns. ? ?______________________________________________________________________ ?Subjective ?patient seen and examined at the bedside.  ?Complains of some soreness at the neck catheter site  ? ?Vitals ?BP 99/60   Pulse 96   Temp 98.4 ?F (36.9 ?C) (Oral)   Resp 16   Ht 5\' 10"  (1.778 m)   Wt 68 kg   SpO2 100%   BMI 21.52 kg/m?  ?  ?  ?Physical Exam ?Constitutional:  sitting up in the bed  ?   Comments:  ? ?Cardiovascular:  ?   Rate and Rhythm: Normal rate and regular rhythm.  ?   Heart sounds:  ? ?Pulmonary:  ?   Effort: Pulmonary effort is normal on room air ?   Comments:  ? ?Abdominal:  ?   Palpations: Abdomen is soft.  ?   Tenderness: non distended and non tender  ? ?Musculoskeletal:     ?   General: No vertebral tenderness  ? ?Skin: ?   Comments: left IJ CVC+, rt groin/Rt neck cannulation site looks ok  ? ?Neurological:  ?   General: Grossly non focal, awake, alert and oriented  ? ?Psychiatric:     ?   Mood and Affect: Mood normal.  ? ?Pertinent Microbiology ?Results for orders placed or performed during the hospital encounter of 11/14/21  ?Blood culture (routine x 2)     Status: None  ? Collection Time: 11/14/21 12:35 PM  ? Specimen: BLOOD LEFT FOREARM  ?Result Value Ref Range Status  ? Specimen Description BLOOD LEFT FOREARM  Final  ? Special Requests   Final  ?  BOTTLES DRAWN AEROBIC AND ANAEROBIC Blood Culture adequate volume  ? Culture   Final  ?  NO GROWTH 6 DAYS ?Performed at [facility], [address] [place], [place] [address] ?  ? Report Status 11/20/2021 FINAL  Final  ?Blood culture (routine x 2)     Status: None  ? Collection Time: 11/14/21 12:57 PM  ? Specimen: BLOOD LEFT ARM  ?Result Value Ref Range Status  ? Specimen Description BLOOD LEFT ARM  Final  ? Special Requests   Final  ?  BOTTLES DRAWN AEROBIC AND ANAEROBIC Blood Culture results may not be optimal due to an excessive volume of blood received in culture bottles  ? Culture   Final  ?  NO GROWTH 6 DAYS ?Performed at [facility], [address] [place], [place] [address] ?  ? Report Status 11/20/2021 FINAL  Final  ?MRSA Next Gen by PCR, Nasal     Status: None  ? Collection Time: 11/15/21 10:48 AM  ? Specimen: Nasal Mucosa; Nasal Swab  ?Result Value Ref Range Status  ? MRSA by PCR Next Gen NOT DETECTED NOT DETECTED Final  ?  Comment: (NOTE) ?The GeneXpert MRSA Assay (FDA approved for NASAL specimens only), ?is one component of a comprehensive MRSA colonization surveillance ?program. It is not intended to diagnose MRSA infection nor to guide ?or monitor treatment for MRSA infections. ?Test performance is not FDA approved in patients less than 2 years ?old. ?Performed at [facility], [address], [place], [place] [address] ?  ?Aerobic/Anaerobic Culture w Gram Stain (surgical/deep wound)     Status: None (Preliminary result)  ? Collection Time: 11/28/21  2:34 PM  ? Specimen: Soft Tissue, Other  ?Result Value Ref Range Status  ? Specimen Description TISSUE  Final  ? Special Requests TRICUSPID VEGETATION SPEC A  Final  ? Gram Stain   Final  ?  RARE WBC PRESENT,BOTH PMN AND MONONUCLEAR ?NO ORGANISMS SEEN ?  ? Culture   Final  ?  FEW STAPHYLOCOCCUS AUREUS ?SUSCEPTIBILITIES TO FOLLOW ?CRITICAL RESULT CALLED TO, READ BACK BY AND VERIFIED WITH: Albin Felling RN, AT [phone] 11/29/21 D.VANHOOK REGARDING CULTURE GROWTH ?Performed at [facility], [place] [address], [place],  [address] ?  ? Report Status PENDING  Incomplete  ?Acid Fast Smear (AFB)     Status: None  ? Collection Time: 11/28/21  2:34 PM  ? Specimen: Soft Tissue, Other  ?Result Value Ref Range Status  ? AFB Specimen Processing Concentration  Final  ? Acid Fast Smear Negative  Final  ?  Comment: (NOTE) ?Performed At: [facility] ?[address] [place], [place] [id number] ?Rush Farmer MD [id number] ?  ? Source (AFB) TRICUSPID VEGETATION  Final  ?  Comment: Performed at [facility], [place] [address], [place],  [address]  ? ? ? ?Pertinent Lab. ? ?  Latest Ref  Rng & Units 11/29/2021  ?  2:07 AM 11/28/2021  ?  1:05 PM 11/23/2021  ?  1:51 AM  ?CBC  ?WBC 4.0 - 10.5 K/uL 8.2    4.8    ?Hemoglobin 13.0 - 17.0 g/dL 8.4   8.8   9.5    ?Hematocrit 39.0 - 52.0 % 25.4   26.0   28.6    ?Platelets 150 - 400 K/uL 208    223    ? ? ?  Latest Ref Rng & Units 11/29/2021  ?  2:07 AM 11/28/2021  ?  1:05 PM 11/27/2021  ? 12:40 AM  ?CMP  ?Glucose 70 - 99 mg/dL 93   82   110    ?  BUN 6 - 20 mg/dL 5   6   5     ?Creatinine 0.61 - 1.24 mg/dL 0.56   0.40   0.78    ?Sodium 135 - 145 mmol/L 137   139   136    ?Potassium 3.5 - 5.1 mmol/L 4.9   4.2   4.1    ?Chloride 98 - 111 mmol/L 104   98   100    ?CO2 22 - 32 mmol/L 29    32    ?Calcium 8.9 - 10.3 mg/dL 8.5    8.5    ?Total Protein 6.5 - 8.1 g/dL 6.9      ?Total Bilirubin 0.3 - 1.2 mg/dL 0.3      ?Alkaline Phos 38 - 126 U/L 54      ?AST 15 - 41 U/L 15      ?ALT 0 - 44 U/L 9      ? ? ? ?Pertinent Imaging today ?Plain films and CT images have been personally visualized and interpreted; radiology reports have been reviewed. Decision making incorporated into the Impression / Recommendations. ? ?No results found. ? ?I spent 52 minutes for this patient encounter including review of prior medical records, coordination of care with primary/other specialist with greater than 50% of time being face to face/counseling and discussing diagnostics/treatment plan with the patient/family. ? ?Electronically signed by:  ? ?Rosiland Oz, MD ?Infectious Disease Physician ?[facility] ?Pager: [phone] ? ?

## 2021-11-30 NOTE — Progress Notes (Signed)
? ?PROGRESS NOTE ? ?Reginald Clarke [id number] DOB: 1991-04-19 DOA: 11/14/2021 ?PCP: Pcp, No ? ?HPI/Recap of past 24 hours: ? Patient is a 31 years old male with past medical history of IV drug abuse, GERD, chronic pain on methadone, presented to the hospital with positive blood cultures.  Patient was recently diagnosed of having community-acquired pneumonia.  On this presentation, patient had MSSA bacteremia and further work-up showed tricuspid valve endocarditis with severe TR and septic pulmonary emboli.  TEE was performed on 11/23/2021 which showed large vegetation of the tricuspid valve.  ID and CT surgery followed the patient during hospitalization.  Currently on Ancef.  Plan for angio VAC on 11/28/2021.  ? ?11/30/21: Has no new complaints. ? ?Assessment/Plan: ?Principal Problem: ?  Bacteremia ?Active Problems: ?  Endocarditis of tricuspid valve ?  Chronic pain syndrome ?  CAP (community acquired pneumonia) ?  GERD (gastroesophageal reflux disease) ?  Tobacco abuse ?  Polysubstance abuse ([name]) ?  Hepatitis C, chronic ([name]) ?  Septic pulmonary embolism ([name]) ?  Methadone dependence ([name]) ? ? ?MSSA bacteremia with septic pulmonary emboli, tricuspid valve endocarditis ?-Repeat blood culture 4/12 negative continue Ancef.  Post angio echo 11/28/2021. ?  ?Chronic back pain, chronic methadone dependence  ?-MRI negative for discitis, osteomyelitis, patient follows up at methadone clinic for methadone.  Continue analgesics. ? ?Polysubstance abuse, tobacco abuse ?-UDS positive for THC, amphetamine ? ?Chronic hepatitis C ?-Follow-up with ID outpatient ?  ?Anxiety ?-Xanax and hydroxyzine as needed, continue ? ?  ?DVT prophylaxis:  ?enoxaparin (LOVENOX) injection 40 mg Start: 11/24/21 1800 ?  ?Code Status: Full code ?  ?Family Communication:  ?Spoke with the patient at bedside.  Girlfriend was present at bedside. ?  ?Disposition Plan: Home ?  ?Status is: Inpatient ?  ?Remains inpatient appropriate because: IV antibiotics,  angiovac 4/26 ?  ?Antimicrobials:  ?Anti-infectives (From admission, onward)  ?  ?  ?  Start     Dose/Rate Route Frequency Ordered Stop  ?  11/24/21 0000   ceFAZolin (ANCEF) IVPB 2g/100 mL premix       ? 2 g ?200 mL/hr over 30 Minutes Intravenous Every 8 hours 11/23/21 1950    ?  11/14/21 1400   ceFAZolin (ANCEF) injection 2 g  Status:  Discontinued       ? 2 g Intramuscular Every 8 hours 11/14/21 1050 11/14/21 1105  ?  11/14/21 1115   ceFAZolin (ANCEF) IVPB 2g/100 mL premix  Status:  Discontinued       ? 2 g ?200 mL/hr over 30 Minutes Intravenous Every 8 hours 11/14/21 1105 11/23/21 1950  ?  11/14/21 1100   vancomycin (VANCOREADY) IVPB 1500 mg/300 mL  Status:  Discontinued       ? 1,500 mg ?150 mL/hr over 120 Minutes Intravenous Every 12 hours 11/14/21 1047 11/14/21 1102  ?  ?   ?  ?  ? ? ? ? ?Objective: ?Vitals:  ? 11/29/21 2338 11/30/21 0455 11/30/21 0459 11/30/21 0803  ?BP: (!) 92/45 99/60 99/60  121/78  ?Pulse: 96   (!) 102  ?Resp: 19 16  19   ?Temp: (!) 97.4 ?F (36.3 ?C) 98.4 ?F (36.9 ?C)  98.3 ?F (36.8 ?C)  ?TempSrc: Oral Oral  Oral  ?SpO2: 100% 100%  98%  ?Weight:      ?Height:      ? ? ?Intake/Output Summary (Last 24 hours) at 11/30/2021 1523 ?Last data filed at 11/30/2021 12/02/2021 ?Gross per 24 hour  ?Intake 540 ml  ?Output --  ?Net  540 ml  ? ?Filed Weights  ? 11/14/21 1023 11/14/21 2000 11/28/21 1100  ?Weight: 70.8 kg 70.2 kg 68 kg  ? ? ?Exam: No significant changes from prior exam ? ?General: 31 y.o. year-old male well developed well nourished in no acute distress.  Alert and oriented x3. ?Cardiovascular: Regular rate and rhythm with no rubs or gallops.  No thyromegaly or JVD noted.   ?Respiratory: Clear to auscultation with no wheezes or rales. Good inspiratory effort. ?Abdomen: Soft nontender nondistended with normal bowel sounds x4 quadrants. ?Musculoskeletal: No lower extremity edema. 2/4 pulses in all 4 extremities. ?Skin: No ulcerative lesions noted or rashes, ?Psychiatry: Mood is appropriate for condition  and setting ? ? ?Data Reviewed: ?CBC: ?Recent Labs  ?Lab 11/28/21 ?1305 11/29/21 ?0207  ?WBC  --  8.2  ?HGB 8.8* 8.4*  ?HCT 26.0* 25.4*  ?MCV  --  91.0  ?PLT  --  208  ? ?Basic Metabolic Panel: ?Recent Labs  ?Lab 11/27/21 ?0040 11/28/21 ?1305 11/29/21 ?0207  ?NA 136 139 137  ?K 4.1 4.2 4.9  ?CL 100 98 104  ?CO2 32  --  29  ?GLUCOSE 110* 82 93  ?BUN 5* 6 <5*  ?CREATININE 0.78 0.40* 0.56*  ?CALCIUM 8.5*  --  8.5*  ?MG  --   --  2.0  ? ?GFR: ?Estimated Creatinine Clearance: 129.9 mL/min (A) (by C-G formula based on SCr of 0.56 mg/dL (L)). ?Liver Function Tests: ?Recent Labs  ?Lab 11/29/21 ?0207  ?AST 15  ?ALT 9  ?ALKPHOS 54  ?BILITOT 0.3  ?PROT 6.9  ?ALBUMIN 2.0*  ? ?No results for input(s): LIPASE, AMYLASE in the last 168 hours. ?No results for input(s): AMMONIA in the last 168 hours. ?Coagulation Profile: ?No results for input(s): INR, PROTIME in the last 168 hours. ?Cardiac Enzymes: ?No results for input(s): CKTOTAL, CKMB, CKMBINDEX, TROPONINI in the last 168 hours. ?BNP (last 3 results) ?No results for input(s): PROBNP in the last 8760 hours. ?HbA1C: ?No results for input(s): HGBA1C in the last 72 hours. ?CBG: ?No results for input(s): GLUCAP in the last 168 hours. ?Lipid Profile: ?No results for input(s): CHOL, HDL, LDLCALC, TRIG, CHOLHDL, LDLDIRECT in the last 72 hours. ?Thyroid Function Tests: ?No results for input(s): TSH, T4TOTAL, FREET4, T3FREE, THYROIDAB in the last 72 hours. ?Anemia Panel: ?No results for input(s): VITAMINB12, FOLATE, FERRITIN, TIBC, IRON, RETICCTPCT in the last 72 hours. ?Urine analysis: ?   ?Component Value Date/Time  ? COLORURINE AMBER (A) 11/10/2021 2301  ? APPEARANCEUR HAZY (A) 11/10/2021 2301  ? LABSPEC 1.016 11/10/2021 2301  ? PHURINE 6.0 11/10/2021 2301  ? GLUCOSEU NEGATIVE 11/10/2021 2301  ? HGBUR SMALL (A) 11/10/2021 2301  ? BILIRUBINUR NEGATIVE 11/10/2021 2301  ? KETONESUR NEGATIVE 11/10/2021 2301  ? PROTEINUR 30 (A) 11/10/2021 2301  ? NITRITE NEGATIVE 11/10/2021 2301  ?  LEUKOCYTESUR MODERATE (A) 11/10/2021 2301  ? ?Sepsis Labs: ?@LABRCNTIP (procalcitonin:4,lacticidven:4) ? ?) ?Recent Results (from the past 240 hour(s))  ?Aerobic/Anaerobic Culture w Gram Stain (surgical/deep wound)     Status: None (Preliminary result)  ? Collection Time: 11/28/21  2:34 PM  ? Specimen: Soft Tissue, Other  ?Result Value Ref Range Status  ? Specimen Description TISSUE  Final  ? Special Requests TRICUSPID VEGETATION SPEC A  Final  ? Gram Stain   Final  ?  RARE WBC PRESENT,BOTH PMN AND MONONUCLEAR ?NO ORGANISMS SEEN ?Performed at [facility], [address] [place], [place] ?  ? Culture   Final  ?  FEW STAPHYLOCOCCUS  AUREUS ?CRITICAL RESULT CALLED TO, READ BACK BY AND VERIFIED WITH: Donnetta HutchingK. CLOER RN, AT [phone] 11/29/21 D.VANHOOK REGARDING CULTURE GROWTH ?NO ANAEROBES ISOLATED; CULTURE IN PROGRESS FOR 5 DAYS ?  ? Report Status PENDING  Incomplete  ? Organism ID, Bacteria STAPHYLOCOCCUS AUREUS  Final  ?    Susceptibility  ? Staphylococcus aureus - MIC*  ?  CIPROFLOXACIN <=0.5 SENSITIVE Sensitive   ?  ERYTHROMYCIN >=8 RESISTANT Resistant   ?  GENTAMICIN <=0.5 SENSITIVE Sensitive   ?  OXACILLIN 0.5 SENSITIVE Sensitive   ?  TETRACYCLINE <=1 SENSITIVE Sensitive   ?  VANCOMYCIN 1 SENSITIVE Sensitive   ?  TRIMETH/SULFA <=10 SENSITIVE Sensitive   ?  CLINDAMYCIN <=0.25 SENSITIVE Sensitive   ?  RIFAMPIN <=0.5 SENSITIVE Sensitive   ?  Inducible Clindamycin NEGATIVE Sensitive   ?  * FEW STAPHYLOCOCCUS AUREUS  ?Acid Fast Smear (AFB)     Status: None  ? Collection Time: 11/28/21  2:34 PM  ? Specimen: Soft Tissue, Other  ?Result Value Ref Range Status  ? AFB Specimen Processing Concentration  Final  ? Acid Fast Smear Negative  Final  ?  Comment: (NOTE) ?Performed At: [facility] ?[address] [place], [place] [id number] ?Jolene SchimkeNagendra Sanjai MD [id number]:[phone] ?  ? Source (AFB) TRICUSPID VEGETATION  Final  ?  Comment: Performed at [facility], [address] [place], [place] [address]  ?   ? ? ?Studies: ?No results found. ? ?Scheduled Meds: ? Chlorhexidine Gluconate Cloth  6 each Topical Daily  ? enoxaparin (LOVENOX) injection  40 mg Subcutaneous Q24H  ? methadone  130 mg Oral Daily  ? And  ? methadone  5 mg

## 2021-12-01 DIAGNOSIS — R7881 Bacteremia: Secondary | ICD-10-CM | POA: Diagnosis not present

## 2021-12-01 NOTE — Progress Notes (Signed)
?PROGRESS NOTE ? ? ? ?Reginald Clarke  ZOX:[id number] DOB: 11/04/1990 DOA: 11/14/2021 ?PCP: Pcp, No ? ? ?Brief Narrative:  ?Patient is a 31 years old male with past medical history of IV drug abuse, GERD, chronic pain on methadone, presented to the hospital with positive blood cultures.  Patient was recently diagnosed of having community-acquired pneumonia.  On this presentation, patient had MSSA bacteremia and further work-up showed tricuspid valve endocarditis with severe TR and septic pulmonary emboli.  TEE was performed on 11/23/2021 which showed large vegetation of the tricuspid valve.  ID and CT surgery followed the patient during hospitalization.  Currently on Ancef.  Status post angio VAC on 11/28/2021.  ?  ?Assessment & Plan: ?  ?Principal Problem: ?  Bacteremia ?Active Problems: ?  Endocarditis of tricuspid valve ?  Chronic pain syndrome ?  CAP (community acquired pneumonia) ?  GERD (gastroesophageal reflux disease) ?  Tobacco abuse ?  Polysubstance abuse ([name]) ?  Hepatitis C, chronic ([name]) ?  Septic pulmonary embolism ([name]) ?  Methadone dependence ([name]) ? ?MSSA bacteremia with septic pulmonary emboli, tricuspid valve endocarditis ?-Repeat blood culture 4/12 negative continue Ancef.  Post angio echo 11/28/2021. ?  ?Chronic back pain, chronic methadone dependence  ?-MRI negative for discitis, osteomyelitis, patient follows up at methadone clinic for methadone. Continue analgesics. ? ?Polysubstance abuse, tobacco abuse ?-UDS positive for THC, amphetamine ? ?Chronic hepatitis C ?-Follow-up with ID outpatient ?  ?Anxiety ?-Xanax and hydroxyzine as needed, continue  ? ?DVT prophylaxis: Lovenox ?Code Status: Full ?Family Communication: None present ? ?Status is: Inpatient ? ?Dispo: The patient is from: Home ?             Anticipated d/c is to: Home ?             Anticipated d/c date is: Pending above ?             Patient currently not medically stable for discharge ? ?Consultants:  ?Infectious disease, cardiothoracic  surgery ? ?Procedures:  ?Angio VAC ? ?Antimicrobials:  ?Cefazolin ? ?Subjective: ?No acute issues or events overnight ? ?Objective: ?Vitals:  ? 11/30/21 0803 11/30/21 2027 12/01/21 0019 12/01/21 0603  ?BP: 121/78 109/67 (!) 91/47 (!) 104/56  ?Pulse: (!) 102 (!) 102 94 92  ?Resp: 19 20 16 20   ?Temp: 98.3 ?F (36.8 ?C) 98.1 ?F (36.7 ?C) 98.1 ?F (36.7 ?C) 98.8 ?F (37.1 ?C)  ?TempSrc: Oral Oral Tympanic Oral  ?SpO2: 98% 97% 97% 99%  ?Weight:      ?Height:      ? ? ?Intake/Output Summary (Last 24 hours) at 12/01/2021 0738 ?Last data filed at 12/01/2021 0106 ?Gross per 24 hour  ?Intake 778.82 ml  ?Output --  ?Net 778.82 ml  ? ?Filed Weights  ? 11/14/21 1023 11/14/21 2000 11/28/21 1100  ?Weight: 70.8 kg 70.2 kg 68 kg  ? ? ?Examination: ? ?General exam: Appears calm and comfortable  ?Respiratory system: Clear to auscultation. Respiratory effort normal. ?Cardiovascular system: S1 & S2 heard, RRR. No JVD, murmurs, rubs, gallops or clicks. No pedal edema. ?Gastrointestinal system: Abdomen is nondistended, soft and nontender. No organomegaly or masses felt. Normal bowel sounds heard. ?Central nervous system: Alert and oriented. No focal neurological deficits. ?Extremities: Symmetric 5 x 5 power. ?Skin: No rashes, lesions or ulcers ?Psychiatry: Judgement and insight appear normal. Mood & affect appropriate.  ? ? ? ?Data Reviewed: I have personally reviewed following labs and imaging studies ? ?CBC: ?Recent Labs  ?Lab 11/28/21 ?1305 11/29/21 ?0207  ?WBC  --  8.2  ?HGB 8.8* 8.4*  ?HCT 26.0* 25.4*  ?MCV  --  91.0  ?PLT  --  208  ? ?Basic Metabolic Panel: ?Recent Labs  ?Lab 11/27/21 ?0040 11/28/21 ?1305 11/29/21 ?0207  ?NA 136 139 137  ?K 4.1 4.2 4.9  ?CL 100 98 104  ?CO2 32  --  29  ?GLUCOSE 110* 82 93  ?BUN 5* 6 <5*  ?CREATININE 0.78 0.40* 0.56*  ?CALCIUM 8.5*  --  8.5*  ?MG  --   --  2.0  ? ?GFR: ?Estimated Creatinine Clearance: 129.9 mL/min (A) (by C-G formula based on SCr of 0.56 mg/dL (L)). ?Liver Function Tests: ?Recent Labs   ?Lab 11/29/21 ?0207  ?AST 15  ?ALT 9  ?ALKPHOS 54  ?BILITOT 0.3  ?PROT 6.9  ?ALBUMIN 2.0*  ? ?No results for input(s): LIPASE, AMYLASE in the last 168 hours. ?No results for input(s): AMMONIA in the last 168 hours. ?Coagulation Profile: ?No results for input(s): INR, PROTIME in the last 168 hours. ?Cardiac Enzymes: ?No results for input(s): CKTOTAL, CKMB, CKMBINDEX, TROPONINI in the last 168 hours. ?BNP (last 3 results) ?No results for input(s): PROBNP in the last 8760 hours. ?HbA1C: ?No results for input(s): HGBA1C in the last 72 hours. ?CBG: ?No results for input(s): GLUCAP in the last 168 hours. ?Lipid Profile: ?No results for input(s): CHOL, HDL, LDLCALC, TRIG, CHOLHDL, LDLDIRECT in the last 72 hours. ?Thyroid Function Tests: ?No results for input(s): TSH, T4TOTAL, FREET4, T3FREE, THYROIDAB in the last 72 hours. ?Anemia Panel: ?No results for input(s): VITAMINB12, FOLATE, FERRITIN, TIBC, IRON, RETICCTPCT in the last 72 hours. ?Sepsis Labs: ?No results for input(s): PROCALCITON, LATICACIDVEN in the last 168 hours. ? ?Recent Results (from the past 240 hour(s))  ?Aerobic/Anaerobic Culture w Gram Stain (surgical/deep wound)     Status: None (Preliminary result)  ? Collection Time: 11/28/21  2:34 PM  ? Specimen: Soft Tissue, Other  ?Result Value Ref Range Status  ? Specimen Description TISSUE  Final  ? Special Requests TRICUSPID VEGETATION SPEC A  Final  ? Gram Stain   Final  ?  RARE WBC PRESENT,BOTH PMN AND MONONUCLEAR ?NO ORGANISMS SEEN ?Performed at [facility], [address] [place], [place] [address] ?  ? Culture   Final  ?  FEW STAPHYLOCOCCUS AUREUS ?CRITICAL RESULT CALLED TO, READ BACK BY AND VERIFIED WITH: Donnetta Hutching RN, AT [phone] 11/29/21 D.VANHOOK REGARDING CULTURE GROWTH ?NO ANAEROBES ISOLATED; CULTURE IN PROGRESS FOR 5 DAYS ?  ? Report Status PENDING  Incomplete  ? Organism ID, Bacteria STAPHYLOCOCCUS AUREUS  Final  ?    Susceptibility  ? Staphylococcus aureus - MIC*  ?  CIPROFLOXACIN <=0.5 SENSITIVE  Sensitive   ?  ERYTHROMYCIN >=8 RESISTANT Resistant   ?  GENTAMICIN <=0.5 SENSITIVE Sensitive   ?  OXACILLIN 0.5 SENSITIVE Sensitive   ?  TETRACYCLINE <=1 SENSITIVE Sensitive   ?  VANCOMYCIN 1 SENSITIVE Sensitive   ?  TRIMETH/SULFA <=10 SENSITIVE Sensitive   ?  CLINDAMYCIN <=0.25 SENSITIVE Sensitive   ?  RIFAMPIN <=0.5 SENSITIVE Sensitive   ?  Inducible Clindamycin NEGATIVE Sensitive   ?  * FEW STAPHYLOCOCCUS AUREUS  ?Acid Fast Smear (AFB)     Status: None  ? Collection Time: 11/28/21  2:34 PM  ? Specimen: Soft Tissue, Other  ?Result Value Ref Range Status  ? AFB Specimen Processing Concentration  Final  ? Acid Fast Smear Negative  Final  ?  Comment: (NOTE) ?Performed At: [facility] ?[address] [place],  [place] [id number] ?Jolene Schimke MD OE:[id number] ?  ? Source (AFB) TRICUSPID VEGETATION  Final  ?  Comment: Performed at [facility], [address], [place], [place] [address]  ?  ? ? ? ? ? ?Radiology Studies: ?No results found. ? ? ? ? ? ?Scheduled Meds: ? Chlorhexidine Gluconate Cloth  6 each Topical Daily  ? enoxaparin (LOVENOX) injection  40 mg Subcutaneous Q24H  ? methadone  130 mg Oral Daily  ? And  ? methadone  5 mg Oral Daily  ? pantoprazole  40 mg Oral Daily  ? ?Continuous Infusions: ?  ceFAZolin (ANCEF) IV 2 g (12/01/21 0017)  ? ? ? LOS: 17 days  ? ?Time spent: ? ?Azucena Fallen, DO ?[facility] ? ?If 7PM-7AM, please contact night-coverage ?[email or web address] ? ?12/01/2021, 7:38 AM   ? ? ? ?

## 2021-12-02 DIAGNOSIS — R7881 Bacteremia: Secondary | ICD-10-CM | POA: Diagnosis not present

## 2021-12-02 NOTE — Progress Notes (Signed)
?PROGRESS NOTE ? ? ? ?Reginald Clarke  ZOX:[id number] DOB: 1991/05/23 DOA: 11/14/2021 ?PCP: Pcp, No ? ? ?Brief Narrative:  ?Patient is a 31 years old male with past medical history of IV drug abuse, GERD, chronic pain on methadone, presented to the hospital with positive blood cultures.  Patient was recently diagnosed of having community-acquired pneumonia.  On this presentation, patient had MSSA bacteremia and further work-up showed tricuspid valve endocarditis with severe TR and septic pulmonary emboli.  TEE was performed on 11/23/2021 which showed large vegetation of the tricuspid valve.  ID and CT surgery followed the patient during hospitalization.  Currently on Ancef.  Status post angio VAC on 11/28/2021.  ?  ?Assessment & Plan: ?  ?Principal Problem: ?  Bacteremia ?Active Problems: ?  Endocarditis of tricuspid valve ?  Chronic pain syndrome ?  CAP (community acquired pneumonia) ?  GERD (gastroesophageal reflux disease) ?  Tobacco abuse ?  Polysubstance abuse ([name]) ?  Hepatitis C, chronic ([name]) ?  Septic pulmonary embolism ([name]) ?  Methadone dependence ([name]) ? ?MSSA bacteremia with septic pulmonary emboli, tricuspid valve endocarditis ?-Repeat blood culture 4/12 negative continue Ancef.  Post angio echo 11/28/2021. ?-Continue current antibiotic regimen per ID, duration and de-escalation per their expertise, appreciate insight and recommendations ?  ?Chronic back pain, chronic methadone dependence  ?-MRI negative for discitis, osteomyelitis, patient follows up at methadone clinic for methadone. Continue analgesics. ? ?Polysubstance abuse, tobacco abuse ?-UDS positive for THC, amphetamine ? ?Chronic hepatitis C ?-Follow-up with ID outpatient ?  ?Anxiety ?-Xanax and hydroxyzine as needed, continue  ? ?DVT prophylaxis: Lovenox ?Code Status: Full ?Family Communication: None present ? ?Status is: Inpatient ? ?Dispo: The patient is from: Home ?             Anticipated d/c is to: Home ?             Anticipated d/c date is:  Pending above ?             Patient currently not medically stable for discharge ? ?Consultants:  ?Infectious disease, cardiothoracic surgery ? ?Procedures:  ?Angio VAC ? ?Antimicrobials:  ?Cefazolin ? ?Subjective: ?No acute issues or events overnight ? ?Objective: ?Vitals:  ? 12/01/21 0842 12/01/21 1920 12/01/21 2325 12/02/21 0300  ?BP: 115/67 119/71 115/72 114/66  ?Pulse: (!) 105 (!) 104 (!) 102 100  ?Resp: 20 18 20 18   ?Temp: 98.4 ?F (36.9 ?C) 98.1 ?F (36.7 ?C) 98.6 ?F (37 ?C) 98.5 ?F (36.9 ?C)  ?TempSrc: Oral Oral Oral Oral  ?SpO2: 96% 97% 97% 96%  ?Weight:      ?Height:      ? ? ?Intake/Output Summary (Last 24 hours) at 12/02/2021 0743 ?Last data filed at 12/01/2021 1920 ?Gross per 24 hour  ?Intake 120 ml  ?Output --  ?Net 120 ml  ? ? ?Filed Weights  ? 11/14/21 1023 11/14/21 2000 11/28/21 1100  ?Weight: 70.8 kg 70.2 kg 68 kg  ? ? ?Examination: ? ?General exam: Appears calm and comfortable  ?Respiratory system: Clear to auscultation. Respiratory effort normal. ?Cardiovascular system: S1 & S2 heard, RRR. No JVD, murmurs, rubs, gallops or clicks. No pedal edema. ?Gastrointestinal system: Abdomen is nondistended, soft and nontender. No organomegaly or masses felt. Normal bowel sounds heard. ?Central nervous system: Alert and oriented. No focal neurological deficits. ?Extremities: Symmetric 5 x 5 power. ?Skin: No rashes, lesions or ulcers ?Psychiatry: Judgement and insight appear normal. Mood & affect appropriate.  ? ? ? ?Data Reviewed: I have personally reviewed following labs  and imaging studies ? ?CBC: ?Recent Labs  ?Lab 11/28/21 ?1305 11/29/21 ?0207  ?WBC  --  8.2  ?HGB 8.8* 8.4*  ?HCT 26.0* 25.4*  ?MCV  --  91.0  ?PLT  --  208  ? ? ?Basic Metabolic Panel: ?Recent Labs  ?Lab 11/27/21 ?0040 11/28/21 ?1305 11/29/21 ?0207  ?NA 136 139 137  ?K 4.1 4.2 4.9  ?CL 100 98 104  ?CO2 32  --  29  ?GLUCOSE 110* 82 93  ?BUN 5* 6 <5*  ?CREATININE 0.78 0.40* 0.56*  ?CALCIUM 8.5*  --  8.5*  ?MG  --   --  2.0  ? ? ?GFR: ?Estimated  Creatinine Clearance: 129.9 mL/min (A) (by C-G formula based on SCr of 0.56 mg/dL (L)). ?Liver Function Tests: ?Recent Labs  ?Lab 11/29/21 ?0207  ?AST 15  ?ALT 9  ?ALKPHOS 54  ?BILITOT 0.3  ?PROT 6.9  ?ALBUMIN 2.0*  ? ? ?No results for input(s): LIPASE, AMYLASE in the last 168 hours. ?No results for input(s): AMMONIA in the last 168 hours. ?Coagulation Profile: ?No results for input(s): INR, PROTIME in the last 168 hours. ?Cardiac Enzymes: ?No results for input(s): CKTOTAL, CKMB, CKMBINDEX, TROPONINI in the last 168 hours. ?BNP (last 3 results) ?No results for input(s): PROBNP in the last 8760 hours. ?HbA1C: ?No results for input(s): HGBA1C in the last 72 hours. ?CBG: ?No results for input(s): GLUCAP in the last 168 hours. ?Lipid Profile: ?No results for input(s): CHOL, HDL, LDLCALC, TRIG, CHOLHDL, LDLDIRECT in the last 72 hours. ?Thyroid Function Tests: ?No results for input(s): TSH, T4TOTAL, FREET4, T3FREE, THYROIDAB in the last 72 hours. ?Anemia Panel: ?No results for input(s): VITAMINB12, FOLATE, FERRITIN, TIBC, IRON, RETICCTPCT in the last 72 hours. ?Sepsis Labs: ?No results for input(s): PROCALCITON, LATICACIDVEN in the last 168 hours. ? ?Recent Results (from the past 240 hour(s))  ?Aerobic/Anaerobic Culture w Gram Stain (surgical/deep wound)     Status: None (Preliminary result)  ? Collection Time: 11/28/21  2:34 PM  ? Specimen: Soft Tissue, Other  ?Result Value Ref Range Status  ? Specimen Description TISSUE  Final  ? Special Requests TRICUSPID VEGETATION SPEC A  Final  ? Gram Stain   Final  ?  RARE WBC PRESENT,BOTH PMN AND MONONUCLEAR ?NO ORGANISMS SEEN ?Performed at [facility], [address], [place], [place] [address] ?  ? Culture   Final  ?  FEW STAPHYLOCOCCUS AUREUS ?CRITICAL RESULT CALLED TO, READ BACK BY AND VERIFIED WITH: Donnetta Hutching RN, AT [phone] 11/29/21 D.VANHOOK REGARDING CULTURE GROWTH ?NO ANAEROBES ISOLATED; CULTURE IN PROGRESS FOR 5 DAYS ?  ? Report Status PENDING  Incomplete  ? Organism  ID, Bacteria STAPHYLOCOCCUS AUREUS  Final  ?    Susceptibility  ? Staphylococcus aureus - MIC*  ?  CIPROFLOXACIN <=0.5 SENSITIVE Sensitive   ?  ERYTHROMYCIN >=8 RESISTANT Resistant   ?  GENTAMICIN <=0.5 SENSITIVE Sensitive   ?  OXACILLIN 0.5 SENSITIVE Sensitive   ?  TETRACYCLINE <=1 SENSITIVE Sensitive   ?  VANCOMYCIN 1 SENSITIVE Sensitive   ?  TRIMETH/SULFA <=10 SENSITIVE Sensitive   ?  CLINDAMYCIN <=0.25 SENSITIVE Sensitive   ?  RIFAMPIN <=0.5 SENSITIVE Sensitive   ?  Inducible Clindamycin NEGATIVE Sensitive   ?  * FEW STAPHYLOCOCCUS AUREUS  ?Acid Fast Smear (AFB)     Status: None  ? Collection Time: 11/28/21  2:34 PM  ? Specimen: Soft Tissue, Other  ?Result Value Ref Range Status  ? AFB Specimen Processing Concentration  Final  ?  Acid Fast Smear Negative  Final  ?  Comment: (NOTE) ?Performed At: [facility] ?[address] [place], [place] [id number] ?Jolene SchimkeNagendra Sanjai MD WU:[id number]:[phone] ?  ? Source (AFB) TRICUSPID VEGETATION  Final  ?  Comment: Performed at [facility], [address] [place], [place] [address]  ? ?  ? ? ? ? ? ?Radiology Studies: ?No results found. ? ? ? ? ? ?Scheduled Meds: ? Chlorhexidine Gluconate Cloth  6 each Topical Daily  ? enoxaparin (LOVENOX) injection  40 mg Subcutaneous Q24H  ? methadone  130 mg Oral Daily  ? And  ? methadone  5 mg Oral Daily  ? pantoprazole  40 mg Oral Daily  ? ?Continuous Infusions: ?  ceFAZolin (ANCEF) IV 2 g (12/01/21 2321)  ? ? ? LOS: 18 days  ? ?Time spent: 40min ? ?Azucena FallenWilliam C Lashanna Angelo, DO ?[facility] ? ?If 7PM-7AM, please contact night-coverage ?[email or web address] ? ?12/02/2021, 7:43 AM   ? ? ? ?

## 2021-12-03 DIAGNOSIS — R7881 Bacteremia: Secondary | ICD-10-CM | POA: Diagnosis not present

## 2021-12-03 LAB — AEROBIC/ANAEROBIC CULTURE W GRAM STAIN (SURGICAL/DEEP WOUND)

## 2021-12-03 NOTE — TOC Progression Note (Signed)
Transition of Care (TOC) - Progression Note  ?Donn Pierini Charity fundraiser, BSN ?Transitions of Care ?Unit 4E- RN Case Manager ?See Treatment Team for direct phone #  ? ? ?Patient Details  ?Name: Reginald Clarke ?MRN: [id number] ?Date of Birth: 1990/10/01 ? ?Transition of Care (TOC) CM/SW Contact  ?Darrold Span, RN ?Phone Number: ?12/03/2021, 12:05 PM ? ?Clinical Narrative:    ?CM spoke with pt regarding PCP needs. 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 Encouraged pt to use his time here to make calls and find a Primary care provider as well as schedule an appointment.  ?Pt voiced appreciation of information.  ? ? ?  ?Barriers to Discharge: Other (must enter comment) (No beds at [facility]) ? ?Expected Discharge Plan and Services ?  ?  ?  ?  ?  ?                ?  ?  ?  ?  ?  ?  ?  ?  ?  ?  ? ? ?Social Determinants of Health (SDOH) Interventions ?  ? ?Readmission Risk Interventions ?   ? View : No data to display.  ?  ?  ?  ? ? ?

## 2021-12-03 NOTE — Progress Notes (Signed)
?PROGRESS NOTE ? ? ? ?Reginald Clarke  JOA:[id number] DOB: 1991-06-11 DOA: 11/14/2021 ?PCP: Pcp, No ? ? ?Brief Narrative:  ?Patient is a 31 years old male with past medical history of IV drug abuse, GERD, chronic pain on methadone, presented to the hospital with positive blood cultures.  Patient was recently diagnosed of having community-acquired pneumonia.  On this presentation, patient had MSSA bacteremia and further work-up showed tricuspid valve endocarditis with severe TR and septic pulmonary emboli.  TEE was performed on 11/23/2021 which showed large vegetation of the tricuspid valve.  ID and CT surgery followed the patient during hospitalization.  Currently on Ancef.  Status post angio VAC on 11/28/2021.  ?  ?Assessment & Plan: ?  ?Principal Problem: ?  Bacteremia ?Active Problems: ?  Endocarditis of tricuspid valve ?  Chronic pain syndrome ?  CAP (community acquired pneumonia) ?  GERD (gastroesophageal reflux disease) ?  Tobacco abuse ?  Polysubstance abuse ([name]) ?  Hepatitis C, chronic ([name]) ?  Septic pulmonary embolism ([name]) ?  Methadone dependence ([name]) ? ?MSSA bacteremia with septic pulmonary emboli, tricuspid valve endocarditis ?-Repeat blood culture 4/12 negative continue Ancef.  Post angio echo 11/28/2021. ?-Patient requesting transition to outpatient treatment, per ID will continue cefazolin through May 10 with outpatient follow-up with oritavancin and weekly infusions with dalbavancin per their expertise. ?  ?Chronic back pain, chronic methadone dependence  ?-MRI negative for discitis, osteomyelitis, patient follows up at methadone clinic for methadone. Continue analgesics. ? ?Polysubstance abuse, tobacco abuse ?-UDS positive for THC, amphetamine ? ?Chronic hepatitis C ?-Follow-up with ID outpatient ?  ?Anxiety ?-Xanax and hydroxyzine as needed, continue  ? ?DVT prophylaxis: Lovenox ?Code Status: Full ?Family Communication: None present ? ?Status is: Inpatient ? ?Dispo: The patient is from: Home ?              Anticipated d/c is to: Home ?             Anticipated d/c date is: Pending above antibiotic regimen changes ?             Patient currently not medically stable for discharge ? ?Consultants:  ?Infectious disease, cardiothoracic surgery ? ?Procedures:  ?Angio VAC ? ?Antimicrobials:  ?Cefazolin -through 12/12/2021 at minimum ? ?Subjective: ?No acute issues or events overnight ? ?Objective: ?Vitals:  ? 12/02/21 0300 12/02/21 1028 12/02/21 1935 12/03/21 0415  ?BP: 114/66 105/63 108/67 105/71  ?Pulse: 100 96 (!) 105 (!) 109  ?Resp: 18 18 18 20   ?Temp: 98.5 ?F (36.9 ?C) 98.7 ?F (37.1 ?C) 98.8 ?F (37.1 ?C) 98.8 ?F (37.1 ?C)  ?TempSrc: Oral Oral Oral Oral  ?SpO2: 96% 96% 94% 99%  ?Weight:      ?Height:      ? ? ?Intake/Output Summary (Last 24 hours) at 12/03/2021 0719 ?Last data filed at 12/03/2021 0415 ?Gross per 24 hour  ?Intake 360 ml  ?Output --  ?Net 360 ml  ? ? ?Filed Weights  ? 11/14/21 1023 11/14/21 2000 11/28/21 1100  ?Weight: 70.8 kg 70.2 kg 68 kg  ? ? ?Examination: ? ?General exam: Appears calm and comfortable  ?Respiratory system: Clear to auscultation. Respiratory effort normal. ?Cardiovascular system: S1 & S2 heard, RRR. No JVD, murmurs, rubs, gallops or clicks. No pedal edema. ?Gastrointestinal system: Abdomen is nondistended, soft and nontender. No organomegaly or masses felt. Normal bowel sounds heard. ?Central nervous system: Alert and oriented. No focal neurological deficits. ?Extremities: Symmetric 5 x 5 power. ?Skin: No rashes, lesions or ulcers ?Psychiatry: Judgement and insight appear  normal. Mood & affect appropriate.  ? ? ? ?Data Reviewed: I have personally reviewed following labs and imaging studies ? ?CBC: ?Recent Labs  ?Lab 11/28/21 ?1305 11/29/21 ?0207  ?WBC  --  8.2  ?HGB 8.8* 8.4*  ?HCT 26.0* 25.4*  ?MCV  --  91.0  ?PLT  --  208  ? ? ?Basic Metabolic Panel: ?Recent Labs  ?Lab 11/27/21 ?0040 11/28/21 ?1305 11/29/21 ?0207  ?NA 136 139 137  ?K 4.1 4.2 4.9  ?CL 100 98 104  ?CO2 32  --  29  ?GLUCOSE  110* 82 93  ?BUN 5* 6 <5*  ?CREATININE 0.78 0.40* 0.56*  ?CALCIUM 8.5*  --  8.5*  ?MG  --   --  2.0  ? ? ?GFR: ?Estimated Creatinine Clearance: 129.9 mL/min (A) (by C-G formula based on SCr of 0.56 mg/dL (L)). ?Liver Function Tests: ?Recent Labs  ?Lab 11/29/21 ?0207  ?AST 15  ?ALT 9  ?ALKPHOS 54  ?BILITOT 0.3  ?PROT 6.9  ?ALBUMIN 2.0*  ? ? ?No results for input(s): LIPASE, AMYLASE in the last 168 hours. ?No results for input(s): AMMONIA in the last 168 hours. ?Coagulation Profile: ?No results for input(s): INR, PROTIME in the last 168 hours. ?Cardiac Enzymes: ?No results for input(s): CKTOTAL, CKMB, CKMBINDEX, TROPONINI in the last 168 hours. ?BNP (last 3 results) ?No results for input(s): PROBNP in the last 8760 hours. ?HbA1C: ?No results for input(s): HGBA1C in the last 72 hours. ?CBG: ?No results for input(s): GLUCAP in the last 168 hours. ?Lipid Profile: ?No results for input(s): CHOL, HDL, LDLCALC, TRIG, CHOLHDL, LDLDIRECT in the last 72 hours. ?Thyroid Function Tests: ?No results for input(s): TSH, T4TOTAL, FREET4, T3FREE, THYROIDAB in the last 72 hours. ?Anemia Panel: ?No results for input(s): VITAMINB12, FOLATE, FERRITIN, TIBC, IRON, RETICCTPCT in the last 72 hours. ?Sepsis Labs: ?No results for input(s): PROCALCITON, LATICACIDVEN in the last 168 hours. ? ?Recent Results (from the past 240 hour(s))  ?Fungus Culture With Stain     Status: None (Preliminary result)  ? Collection Time: 11/28/21  2:34 PM  ? Specimen: Soft Tissue, Other  ?Result Value Ref Range Status  ? Fungus Stain Final report  Final  ?  Comment: (NOTE) ?Performed At: [facility] ?[address] [place], [place] [id number] ?Jolene Schimke MD KJ:[id number] ?  ? Fungus (Mycology) Culture PENDING  Incomplete  ? Fungal Source SPEC A  Final  ?  Comment: Performed at [facility], [address] [place], [place] [address]  ?Aerobic/Anaerobic Culture w Gram Stain (surgical/deep wound)     Status: None (Preliminary result)  ?  Collection Time: 11/28/21  2:34 PM  ? Specimen: Soft Tissue, Other  ?Result Value Ref Range Status  ? Specimen Description TISSUE  Final  ? Special Requests TRICUSPID VEGETATION SPEC A  Final  ? Gram Stain   Final  ?  RARE WBC PRESENT,BOTH PMN AND MONONUCLEAR ?NO ORGANISMS SEEN ?Performed at [facility], [address] [place], [place] [address] ?  ? Culture   Final  ?  FEW STAPHYLOCOCCUS AUREUS ?CRITICAL RESULT CALLED TO, READ BACK BY AND VERIFIED WITH: Donnetta Hutching RN, AT [phone] 11/29/21 D.VANHOOK REGARDING CULTURE GROWTH ?NO ANAEROBES ISOLATED; CULTURE IN PROGRESS FOR 5 DAYS ?  ? Report Status PENDING  Incomplete  ? Organism ID, Bacteria STAPHYLOCOCCUS AUREUS  Final  ?    Susceptibility  ? Staphylococcus aureus - MIC*  ?  CIPROFLOXACIN <=0.5 SENSITIVE Sensitive   ?  ERYTHROMYCIN >=8 RESISTANT Resistant   ?  GENTAMICIN <=0.5 SENSITIVE Sensitive   ?  OXACILLIN 0.5 SENSITIVE Sensitive   ?  TETRACYCLINE <=1 SENSITIVE Sensitive   ?  VANCOMYCIN 1 SENSITIVE Sensitive   ?  TRIMETH/SULFA <=10 SENSITIVE Sensitive   ?  CLINDAMYCIN <=0.25 SENSITIVE Sensitive   ?  RIFAMPIN <=0.5 SENSITIVE Sensitive   ?  Inducible Clindamycin NEGATIVE Sensitive   ?  * FEW STAPHYLOCOCCUS AUREUS  ?Acid Fast Smear (AFB)     Status: None  ? Collection Time: 11/28/21  2:34 PM  ? Specimen: Soft Tissue, Other  ?Result Value Ref Range Status  ? AFB Specimen Processing Concentration  Final  ? Acid Fast Smear Negative  Final  ?  Comment: (NOTE) ?Performed At: [facility] ?[address] [place], [place] [id number] ?Jolene SchimkeNagendra Sanjai MD [id number]:[phone] ?  ? Source (AFB) TRICUSPID VEGETATION  Final  ?  Comment: Performed at [facility], [address] [place], [place] [address]  ?Fungus Culture Result     Status: None  ? Collection Time: 11/28/21  2:34 PM  ?Result Value Ref Range Status  ? Result 1 Comment  Final  ?  Comment: (NOTE) ?KOH/Calcofluor preparation:  no fungus observed. ?Performed At: [facility] ?[address]  [place], [place] [id number] ?Jolene SchimkeNagendra Sanjai MD LK:[id number]:[phone] ?  ? ?  ? ? ? ? ? ?Radiology Studies: ?No results found. ? ? ? ? ? ?Scheduled Meds: ? Chlorhexidine Gluconate Cloth  6 each Topical Daily  ? enoxaparin (LOVE

## 2021-12-03 NOTE — Progress Notes (Addendum)
? ?[facility] Infectious Diseases Follow Up Note ? ?Patient Identification: ?Patient Name: Reginald Clarke MRN: [id number] Admit Date: 11/14/2021 10:24 AM ?Age: 31 y.o.Today's Date: 12/03/2021 ? ?Reason for Visit: follow up on endocarditis  ? ?Principal Problem: ?  Bacteremia ?Active Problems: ?  Chronic pain syndrome ?  CAP (community acquired pneumonia) ?  GERD (gastroesophageal reflux disease) ?  Tobacco abuse ?  Endocarditis of tricuspid valve ?  Polysubstance abuse ([name]) ?  Hepatitis C, chronic ([name]) ?  Septic pulmonary embolism ([name]) ?  Methadone dependence ([name]) ? ? ?Antibiotics: ?Cefazolin 4/12-c ? ?Lines/Hardwares:  ? ?Interval Events: remains afebrile,no new concerns  ? ?Assessment ?MSSA native TV endocarditis with multiple vegetations complicated with septic pulmonary emboli and rt pleural effusion ( no fluid to drain per IR 4/17) ?Severe TVR ?- 4/26 s/p debridement of rt atrial mass and TV vegetation. OR findings Good results with debridement.  There is a residual subvalvular component of the vegetation which was not accessible.  This likely was part of the torn chordae. OR cultures with MSSA ? ?Hepatitis C  ? ?Polysubstance use - on methadone ? ?Recommendations ?On talking to him, he is not willing to stay in the hospital for 6 weeks to receive IV abtx. Discussed alternative options regarding weekly long acting injectables to which he is agreeable and denies any barriers.  Hence, will plan to treat inpatient with IV cefazolin for 2 weeks from 4/26 ( until May 10) then one dose of oritavancin on 5/10 followed by weekly dalbavancin to be arranged OP to complete 6 weeks course. ID pharmacy to help coordinate weekly infusions of dalbavancin  ?Monitor CBC and BMP weekly while on cefazolin ?HCV tx as OP ?Will arrange for ID fu. Please call with questions.  ? ?Rest of the management as per the primary team. ?Thank you for the consult. Please page with pertinent  questions or concerns. ? ?______________________________________________________________________ ?Subjective ?patient seen and examined at the bedside.  ?Doing well ?He is not willing to stay in the hospital for 6 weeks  ? ?Vitals ?BP 105/71 (BP Location: Left Arm)   Pulse (!) 109   Temp 98.8 ?F (37.1 ?C) (Oral)   Resp 20   Ht 5\' 10"  (1.778 m)   Wt 68 kg   SpO2 99%   BMI 21.52 kg/m?  ?  ?  ?Physical Exam ?Constitutional:  sitting up in the bed  ?   Comments:  ? ?Cardiovascular:  ?   Rate and Rhythm: Normal rate and regular rhythm.  ?   Heart sounds:  ? ?Pulmonary:  ?   Effort: Pulmonary effort is normal on room air ?   Comments:  ? ?Abdominal:  ?   Palpations: Abdomen is soft.  ?   Tenderness: non distended and non tender  ? ?Musculoskeletal:     ?   General: No vertebral tenderness  ? ?Neurological:  ?   General: Grossly non focal, awake, alert and oriented  ? ?Psychiatric:     ?   Mood and Affect: Mood normal.  ? ?Pertinent Microbiology ?Results for orders placed or performed during the hospital encounter of 11/14/21  ?Blood culture (routine x 2)     Status: None  ? Collection Time: 11/14/21 12:35 PM  ? Specimen: BLOOD LEFT FOREARM  ?Result Value Ref Range Status  ? Specimen Description BLOOD LEFT FOREARM  Final  ? Special Requests   Final  ?  BOTTLES DRAWN AEROBIC AND ANAEROBIC Blood Culture adequate volume  ? Culture   Final  ?  NO GROWTH 6 DAYS ?Performed at [facility], [address] [place], [place] [address] ?  ? Report Status 11/20/2021 FINAL  Final  ?Blood culture (routine x 2)     Status: None  ? Collection Time: 11/14/21 12:57 PM  ? Specimen: BLOOD LEFT ARM  ?Result Value Ref Range Status  ? Specimen Description BLOOD LEFT ARM  Final  ? Special Requests   Final  ?  BOTTLES DRAWN AEROBIC AND ANAEROBIC Blood Culture results may not be optimal due to an excessive volume of blood received in culture bottles  ? Culture   Final  ?  NO GROWTH 6 DAYS ?Performed at [facility], [address]  [place], [place] [address] ?  ? Report Status 11/20/2021 FINAL  Final  ?MRSA Next Gen by PCR, Nasal     Status: None  ? Collection Time: 11/15/21 10:48 AM  ? Specimen: Nasal Mucosa; Nasal Swab  ?Result Value Ref Range Status  ? MRSA by PCR Next Gen NOT DETECTED NOT DETECTED Final  ?  Comment: (NOTE) ?The GeneXpert MRSA Assay (FDA approved for NASAL specimens only), ?is one component of a comprehensive MRSA colonization surveillance ?program. It is not intended to diagnose MRSA infection nor to guide ?or monitor treatment for MRSA infections. ?Test performance is not FDA approved in patients less than 2 years ?old. ?Performed at [facility], [address] [place], [place] [address] ?  ?Fungus Culture With Stain     Status: None (Preliminary result)  ? Collection Time: 11/28/21  2:34 PM  ? Specimen: Soft Tissue, Other  ?Result Value Ref Range Status  ? Fungus Stain Final report  Final  ?  Comment: (NOTE) ?Performed At: [facility] ?[address] [place], [place] [id number] ?Jolene SchimkeNagendra Sanjai MD OZ:[id number]:[phone] ?  ? Fungus (Mycology) Culture PENDING  Incomplete  ? Fungal Source SPEC A  Final  ?  Comment: Performed at [facility], [address] [place], [place] [address]  ?Aerobic/Anaerobic Culture w Gram Stain (surgical/deep wound)     Status: None (Preliminary result)  ? Collection Time: 11/28/21  2:34 PM  ? Specimen: Soft Tissue, Other  ?Result Value Ref Range Status  ? Specimen Description TISSUE  Final  ? Special Requests TRICUSPID VEGETATION SPEC A  Final  ? Gram Stain   Final  ?  RARE WBC PRESENT,BOTH PMN AND MONONUCLEAR ?NO ORGANISMS SEEN ?Performed at [facility], [address] [place], [place] [address] ?  ? Culture   Final  ?  FEW STAPHYLOCOCCUS AUREUS ?CRITICAL RESULT CALLED TO, READ BACK BY AND VERIFIED WITH: Donnetta HutchingK. CLOER RN, AT [phone] 11/29/21 D.VANHOOK REGARDING CULTURE GROWTH ?NO ANAEROBES ISOLATED; CULTURE IN PROGRESS FOR 5 DAYS ?  ? Report Status PENDING  Incomplete  ? Organism ID, Bacteria  STAPHYLOCOCCUS AUREUS  Final  ?    Susceptibility  ? Staphylococcus aureus - MIC*  ?  CIPROFLOXACIN <=0.5 SENSITIVE Sensitive   ?  ERYTHROMYCIN >=8 RESISTANT Resistant   ?  GENTAMICIN <=0.5 SENSITIVE Sensitive   ?  OXACILLIN 0.5 SENSITIVE Sensitive   ?  TETRACYCLINE <=1 SENSITIVE Sensitive   ?  VANCOMYCIN 1 SENSITIVE Sensitive   ?  TRIMETH/SULFA <=10 SENSITIVE Sensitive   ?  CLINDAMYCIN <=0.25 SENSITIVE Sensitive   ?  RIFAMPIN <=0.5 SENSITIVE Sensitive   ?  Inducible Clindamycin NEGATIVE Sensitive   ?  * FEW STAPHYLOCOCCUS AUREUS  ?Acid Fast Smear (AFB)     Status: None  ? Collection Time: 11/28/21  2:34  PM  ? Specimen: Soft Tissue, Other  ?Result Value Ref Range Status  ? AFB Specimen Processing Concentration  Final  ? Acid Fast Smear Negative  Final  ?  Comment: (NOTE) ?Performed At: [facility] ?[address] [place], [place] [address] ?Jolene Schimke MD [id number] ?  ? Source (AFB) TRICUSPID VEGETATION  Final  ?  Comment: Performed at [facility], [address], [place], [place] [address]  ?Fungus Culture Result     Status: None  ? Collection Time: 11/28/21  2:34 PM  ?Result Value Ref Range Status  ? Result 1 Comment  Final  ?  Comment: (NOTE) ?KOH/Calcofluor preparation:  no fungus observed. ?Performed At: [facility] [place] ?[address] [place], [place] [address] ?Jolene Schimke MD [id number] ?  ? ? ? ?Pertinent Lab. ? ?  Latest Ref Rng & Units 11/29/2021  ?  2:07 AM 11/28/2021  ?  1:05 PM 11/23/2021  ?  1:51 AM  ?CBC  ?WBC 4.0 - 10.5 K/uL 8.2    4.8    ?Hemoglobin 13.0 - 17.0 g/dL 8.4   8.8   9.5    ?Hematocrit 39.0 - 52.0 % 25.4   26.0   28.6    ?Platelets 150 - 400 K/uL 208    223    ? ? ?  Latest Ref Rng & Units 11/29/2021  ?  2:07 AM 11/28/2021  ?  1:05 PM 11/27/2021  ? 12:40 AM  ?CMP  ?Glucose 70 - 99 mg/dL 93   82   812    ?BUN 6 - 20 mg/dL 5   6   5     ?Creatinine 0.61 - 1.24 mg/dL 7.51   7.00   1.74    ?Sodium 135 - 145 mmol/L 137   139   136    ?Potassium 3.5 - 5.1 mmol/L  4.9   4.2   4.1    ?Chloride 98 - 111 mmol/L 104   98   100    ?CO2 22 - 32 mmol/L 29    32    ?Calcium 8.9 - 10.3 mg/dL 8.5    8.5    ?Total Protein 6.5 - 8.1 g/dL 6.9      ?Total Bilirubin 0.3 - 1.2 mg/dL 0.3

## 2021-12-04 ENCOUNTER — Other Ambulatory Visit (HOSPITAL_COMMUNITY): Payer: Self-pay

## 2021-12-04 ENCOUNTER — Telehealth: Payer: Self-pay | Admitting: Pharmacy Technician

## 2021-12-04 ENCOUNTER — Other Ambulatory Visit: Payer: Self-pay | Admitting: Pharmacy Technician

## 2021-12-04 ENCOUNTER — Other Ambulatory Visit (HOSPITAL_COMMUNITY): Payer: Self-pay | Admitting: Pharmacist

## 2021-12-04 DIAGNOSIS — R7881 Bacteremia: Secondary | ICD-10-CM | POA: Diagnosis not present

## 2021-12-04 MED ORDER — ORITAVANCIN DIPHOSPHATE 400 MG IV SOLR
1200.0000 mg | Freq: Once | INTRAVENOUS | Status: AC
  Administered 2021-12-12: 1200 mg via INTRAVENOUS
  Filled 2021-12-04: qty 120

## 2021-12-04 NOTE — Progress Notes (Signed)
?PROGRESS NOTE ? ? ? ?Reginald Clarke  [id number] DOB: 1990-10-25 DOA: 11/14/2021 ?PCP: Pcp, No ? ? ?Brief Narrative:  ?Patient is a 31 years old male with past medical history of IV drug abuse, GERD, chronic pain on methadone, presented to the hospital with positive blood cultures.  Patient was recently diagnosed of having community-acquired pneumonia.  On this presentation, patient had MSSA bacteremia and further work-up showed tricuspid valve endocarditis with severe TR and septic pulmonary emboli.  TEE was performed on 11/23/2021 which showed large vegetation of the tricuspid valve.  ID and CT surgery followed the patient during hospitalization.  Currently on Ancef.  Status post angio VAC on 11/28/2021. Plan for IV antibiotics through 12/12/21 with discharge home and outpatient antibiotics per ID recs as below. ?  ?Assessment & Plan: ?  ?Principal Problem: ?  Bacteremia ?Active Problems: ?  Endocarditis of tricuspid valve ?  Chronic pain syndrome ?  CAP (community acquired pneumonia) ?  GERD (gastroesophageal reflux disease) ?  Tobacco abuse ?  Polysubstance abuse ([name]) ?  Hepatitis C, chronic ([name]) ?  Septic pulmonary embolism ([name]) ?  Methadone dependence ([name]) ? ?MSSA bacteremia with septic pulmonary emboli, tricuspid valve endocarditis ?-Repeat blood culture 4/12 negative continue Ancef.  Post angio echo 11/28/2021. ?-Patient requesting transition to outpatient treatment, per ID will continue cefazolin through May 10 with outpatient follow-up with oritavancin and weekly infusions with dalbavancin per their expertise. ?  ?Chronic back pain, chronic methadone dependence  ?-MRI negative for discitis, osteomyelitis, patient follows up at methadone clinic for methadone. Continue analgesics. ? ?Polysubstance abuse, tobacco abuse ?-UDS positive for THC, amphetamine ? ?Chronic hepatitis C ?-Follow-up with ID outpatient ?  ?Anxiety ?-Xanax and hydroxyzine as needed, continue  ? ?DVT prophylaxis: Lovenox ?Code Status:  Full ?Family Communication: None present ? ?Status is: Inpatient ? ?Dispo: The patient is from: Home ?             Anticipated d/c is to: Home ?             Anticipated d/c date is: Pending above antibiotic regimen changes ?             Patient currently not medically stable for discharge ? ?Consultants:  ?Infectious disease, cardiothoracic surgery ? ?Procedures:  ?Angio VAC ? ?Antimicrobials:  ?Cefazolin -through 12/12/2021 at minimum ? ?Subjective: ?No acute issues or events overnight ? ?Objective: ?Vitals:  ? 12/03/21 1302 12/03/21 2000 12/04/21 0000 12/04/21 0500  ?BP: 104/67 103/66 94/66 112/61  ?Pulse: (!) 106 99 99 97  ?Resp: 18 20 20 20   ?Temp: 97.9 ?F (36.6 ?C) 98.7 ?F (37.1 ?C) 98.6 ?F (37 ?C) 98.7 ?F (37.1 ?C)  ?TempSrc:  Oral Oral Oral  ?SpO2:  97% 98% 98%  ?Weight:      ?Height:      ? ?No intake or output data in the 24 hours ending 12/04/21 0746 ? ?Filed Weights  ? 11/14/21 1023 11/14/21 2000 11/28/21 1100  ?Weight: 70.8 kg 70.2 kg 68 kg  ? ? ?Examination: ? ?General exam: Appears calm and comfortable  ?Respiratory system: Clear to auscultation. Respiratory effort normal. ?Cardiovascular system: S1 & S2 heard, RRR. No JVD, murmurs, rubs, gallops or clicks. No pedal edema. ?Gastrointestinal system: Abdomen is nondistended, soft and nontender. No organomegaly or masses felt. Normal bowel sounds heard. ?Central nervous system: Alert and oriented. No focal neurological deficits. ?Extremities: Symmetric 5 x 5 power. ?Skin: No rashes, lesions or ulcers ?Psychiatry: Judgement and insight appear normal. Mood & affect appropriate.  ? ? ? ?  Data Reviewed: I have personally reviewed following labs and imaging studies ? ?CBC: ?Recent Labs  ?Lab 11/28/21 ?1305 11/29/21 ?0207  ?WBC  --  8.2  ?HGB 8.8* 8.4*  ?HCT 26.0* 25.4*  ?MCV  --  91.0  ?PLT  --  208  ? ? ?Basic Metabolic Panel: ?Recent Labs  ?Lab 11/28/21 ?1305 11/29/21 ?0207  ?NA 139 137  ?K 4.2 4.9  ?CL 98 104  ?CO2  --  29  ?GLUCOSE 82 93  ?BUN 6 <5*   ?CREATININE 0.40* 0.56*  ?CALCIUM  --  8.5*  ?MG  --  2.0  ? ? ?GFR: ?Estimated Creatinine Clearance: 129.9 mL/min (A) (by C-G formula based on SCr of 0.56 mg/dL (L)). ?Liver Function Tests: ?Recent Labs  ?Lab 11/29/21 ?0207  ?AST 15  ?ALT 9  ?ALKPHOS 54  ?BILITOT 0.3  ?PROT 6.9  ?ALBUMIN 2.0*  ? ? ?No results for input(s): LIPASE, AMYLASE in the last 168 hours. ?No results for input(s): AMMONIA in the last 168 hours. ?Coagulation Profile: ?No results for input(s): INR, PROTIME in the last 168 hours. ?Cardiac Enzymes: ?No results for input(s): CKTOTAL, CKMB, CKMBINDEX, TROPONINI in the last 168 hours. ?BNP (last 3 results) ?No results for input(s): PROBNP in the last 8760 hours. ?HbA1C: ?No results for input(s): HGBA1C in the last 72 hours. ?CBG: ?No results for input(s): GLUCAP in the last 168 hours. ?Lipid Profile: ?No results for input(s): CHOL, HDL, LDLCALC, TRIG, CHOLHDL, LDLDIRECT in the last 72 hours. ?Thyroid Function Tests: ?No results for input(s): TSH, T4TOTAL, FREET4, T3FREE, THYROIDAB in the last 72 hours. ?Anemia Panel: ?No results for input(s): VITAMINB12, FOLATE, FERRITIN, TIBC, IRON, RETICCTPCT in the last 72 hours. ?Sepsis Labs: ?No results for input(s): PROCALCITON, LATICACIDVEN in the last 168 hours. ? ?Recent Results (from the past 240 hour(s))  ?Fungus Culture With Stain     Status: None (Preliminary result)  ? Collection Time: 11/28/21  2:34 PM  ? Specimen: Soft Tissue, Other  ?Result Value Ref Range Status  ? Fungus Stain Final report  Final  ?  Comment: (NOTE) ?Performed At: [facility] ?[address] [place], [place] [id number] ?Jolene Schimke MD [id number] ?  ? Fungus (Mycology) Culture PENDING  Incomplete  ? Fungal Source SPEC A  Final  ?  Comment: Performed at [facility], [address], [place], [place] [address]  ?Aerobic/Anaerobic Culture w Gram Stain (surgical/deep wound)     Status: None  ? Collection Time: 11/28/21  2:34 PM  ? Specimen: Soft Tissue, Other   ?Result Value Ref Range Status  ? Specimen Description TISSUE  Final  ? Special Requests TRICUSPID VEGETATION SPEC A  Final  ? Gram Stain   Final  ?  RARE WBC PRESENT,BOTH PMN AND MONONUCLEAR ?NO ORGANISMS SEEN ?  ? Culture   Final  ?  FEW STAPHYLOCOCCUS AUREUS ?CRITICAL RESULT CALLED TO, READ BACK BY AND VERIFIED WITH: Donnetta Hutching RN, AT [phone] 11/29/21 D.VANHOOK REGARDING CULTURE GROWTH ?NO ANAEROBES ISOLATED ?Performed at [facility], [address], [place], [place] [address] ?  ? Report Status 12/03/2021 FINAL  Final  ? Organism ID, Bacteria STAPHYLOCOCCUS AUREUS  Final  ?    Susceptibility  ? Staphylococcus aureus - MIC*  ?  CIPROFLOXACIN <=0.5 SENSITIVE Sensitive   ?  ERYTHROMYCIN >=8 RESISTANT Resistant   ?  GENTAMICIN <=0.5 SENSITIVE Sensitive   ?  OXACILLIN 0.5 SENSITIVE Sensitive   ?  TETRACYCLINE <=1 SENSITIVE Sensitive   ?  VANCOMYCIN  1 SENSITIVE Sensitive   ?  TRIMETH/SULFA <=10 SENSITIVE Sensitive   ?  CLINDAMYCIN <=0.25 SENSITIVE Sensitive   ?  RIFAMPIN <=0.5 SENSITIVE Sensitive   ?  Inducible Clindamycin NEGATIVE Sensitive   ?  * FEW STAPHYLOCOCCUS AUREUS  ?Acid Fast Smear (AFB)     Status: None  ? Collection Time: 11/28/21  2:34 PM  ? Specimen: Soft Tissue, Other  ?Result Value Ref Range Status  ? AFB Specimen Processing Concentration  Final  ? Acid Fast Smear Negative  Final  ?  Comment: (NOTE) ?Performed At: [facility] ?[address] [place], [place] [id number] ?Jolene Schimke MD [id number] ?  ? Source (AFB) TRICUSPID VEGETATION  Final  ?  Comment: Performed at [facility], [address], [place], [place] [address]  ?Fungus Culture Result     Status: None  ? Collection Time: 11/28/21  2:34 PM  ?Result Value Ref Range Status  ? Result 1 Comment  Final  ?  Comment: (NOTE) ?KOH/Calcofluor preparation:  no fungus observed. ?Performed At: [facility] ?[address] [place], [place] [id number] ?Jolene Schimke MD [id number] ?  ? ?  ? ? ? ? ? ?Radiology Studies: ?No  results found. ? ? ? ? ? ?Scheduled Meds: ? enoxaparin (LOVENOX) injection  40 mg Subcutaneous Q24H  ? methadone  130 mg Oral Daily  ? And  ? methadone  5 mg Oral Daily  ? pantoprazole  40 mg Oral Daily  ? ?Continuous

## 2021-12-04 NOTE — Telephone Encounter (Addendum)
Dr. West Bali, ?Fyi note: ? ?Auth Submission: no auth needed ?Payer: BCBS ?Medication & CPT/J Code(s) submitted: DALVANCE LJ:[id number] ?Route of submission (phone, fax, portal): PHONE: [phone] ?Auth type: Buy/Bill ?Units/visits requested: 3 DOSES ?Reference number: [facility] 12/04/21 @ 12:55 ?Approval from: 12/04/21 to 02/03/22  ? ? ?Patient will be scheduled as soon as possible ? ?[place] CO-PAY CARD: APPROVED ?PHONE: [phone] ?[id number] EOB/CLAIM ?ID: [id number] ? ?  ?

## 2021-12-05 ENCOUNTER — Encounter: Payer: Self-pay | Admitting: Pulmonary Disease

## 2021-12-05 DIAGNOSIS — B9561 Methicillin susceptible Staphylococcus aureus infection as the cause of diseases classified elsewhere: Secondary | ICD-10-CM | POA: Diagnosis not present

## 2021-12-05 DIAGNOSIS — R7881 Bacteremia: Secondary | ICD-10-CM | POA: Diagnosis not present

## 2021-12-05 DIAGNOSIS — F419 Anxiety disorder, unspecified: Secondary | ICD-10-CM

## 2021-12-05 MED ORDER — ALPRAZOLAM 0.5 MG PO TABS
1.0000 mg | ORAL_TABLET | Freq: Two times a day (BID) | ORAL | Status: DC | PRN
  Administered 2021-12-06 – 2021-12-08 (×5): 1 mg via ORAL
  Filled 2021-12-05 (×6): qty 2

## 2021-12-05 NOTE — Assessment & Plan Note (Addendum)
Few tablets of Xanax prescribed at the time of discharge. ?

## 2021-12-05 NOTE — Hospital Course (Addendum)
Patient is a 31 years old male with past medical history of IV drug abuse, GERD, chronic pain on methadone, presented to the hospital with positive blood cultures.  Patient was recently diagnosed of having community-acquired pneumonia.  On this presentation, patient had MSSA bacteremia and further work-up showed tricuspid valve endocarditis with severe TR and septic pulmonary emboli.  TEE was performed on 11/23/2021 which showed large vegetation of the tricuspid valve.  ID and CT surgery followed the patient during hospitalization.  Currently on Ancef.  Status post angio VAC on 11/28/2021. Plan for IV antibiotics through 12/12/21 with discharge home and outpatient antibiotics per ID recs. ?

## 2021-12-05 NOTE — Assessment & Plan Note (Addendum)
Treating as above  

## 2021-12-05 NOTE — Progress Notes (Signed)
?PROGRESS NOTE ? ? ? ?Reginald Clarke  [id number] DOB: Mar 13, 1991 DOA: 11/14/2021 ?Reginald Clarke: Reginald Clarke, Reginald Clarke  ?Chief Complaint  ?Patient presents with  ? abdnormal lab  ? ? ?Brief Narrative:  ?Patient is Reginald Clarke 31 years old male with past medical history of IV drug abuse, GERD, chronic pain on methadone, presented to the hospital with positive blood cultures.  Patient was recently diagnosed of having community-acquired pneumonia.  On this presentation, patient had MSSA bacteremia and further work-up showed tricuspid valve endocarditis with severe TR and septic pulmonary emboli.  TEE was performed on 11/23/2021 which showed large vegetation of the tricuspid valve.  ID and CT surgery followed the patient during hospitalization.  Currently on Ancef.  Status post angio VAC on 11/28/2021. Plan for IV antibiotics through 12/12/21 with discharge home and outpatient antibiotics per ID recs as below.  ? ? ?Assessment & Plan: ?  ?Principal Problem: ?  MSSA bacteremia ?Active Problems: ?  Endocarditis of tricuspid valve ?  Septic pulmonary embolism ([place]) ?  CAP (community acquired pneumonia) ?  Chronic pain syndrome ?  GERD (gastroesophageal reflux disease) ?  Hepatitis C, chronic ([place]) ?  Tobacco abuse ?  Polysubstance abuse ([place]) ?  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  Anxiety ? ? ?Assessment and Plan: ?* MSSA bacteremia ?As above ? ?Endocarditis of tricuspid valve ?Now s/p R femoral vein cannulation, R IJ vein cannulation, R heart cannulation, and debridement of R atrial mass and tricuspid valve vegetation on 4/26 with CT surgery -> good results with debridement, residual subvalvular component of vegetation which was not accessible, likely part of torn chordae. ?4/8 with staph aureus in urine ?MSSA 4/9 cultures ?4/12 cultures Reginald Clarke growth ?Follow cx results from 4/26, few staph aureus ?ID recommending cefazolin until 5/10, then weekly dalbavancin to complete 6 weeks (he's unwilling to stay in hospital for 6 weeks) ? ? ? ? ?Septic pulmonary embolism  ([place]) ?Treating as above ? ?Chronic pain syndrome ?-chronic back pain, T/L spine without acute infection in spine ?-concern for layering R effusion on MRI, [place] without effusion ?-Reports actively follow as an outpatient for methadone therapy. ?-Continue home methadone dose. ?-will need to ensure he's able to resume this after discharge, will try to call his provider to confirm ? ?GERD (gastroesophageal reflux disease) ?-continue PPI. ? ?Hepatitis C, chronic ([place]) ?Will need outpatient treatment ? ?Anxiety ?Has xanax prn, will start to taper this as we approach discharge - he's been getting rather regularly since 4/23 ? ?Polysubstance abuse ([place]) ?- Cessation counseling provided ?-UDS done during this hospitalization demonstrated positive THC and amphetamines. ? ?Tobacco abuse ?-Smoking cessation counseling has been provided ?-Patient declined nicotine patch. ? ? ?DVT prophylaxis: lovenox ?Code Status: full ?Family Communication: none ?Disposition:  ? ?Status is: Inpatient ?Remains inpatient appropriate because: need for IV abx ?  ?Consultants:  ?CT surgery ?ID ? ?Procedures:  ?4/26 CT surgery ?-Right femoral vein cannulation with Reginald Clarke 18 F cannula ?- Right internal jugular vein cannulation with Reginald Clarke 42F Sheath ?- Right heart cannulation ?- Debridement of right atrial mass ?- Debridement of tricuspid valve vegetation ? ?Antimicrobials:  ?Anti-infectives (From admission, onward)  ? ? Start     Dose/Rate Route Frequency Ordered Stop  ? 12/12/21 0800  Reginald Diphosphate (ORBACTIV) 1,200 mg in dextrose 5 % IVPB       ? 1,200 mg ?333.3 mL/hr over 180 Minutes Intravenous Once 12/04/21 1003    ? 11/24/21 0000  ceFAZolin (ANCEF) IVPB 2g/100 mL premix       ? 2  g ?200 mL/hr over 30 Minutes Intravenous Every 8 hours 11/23/21 1950 12/12/21 0800  ? 11/14/21 1400  ceFAZolin (ANCEF) injection 2 g  Status:  Discontinued       ? 2 g Intramuscular Every 8 hours 11/14/21 1050 11/14/21 1105  ? 11/14/21 1115  ceFAZolin (ANCEF) IVPB 2g/100  mL premix  Status:  Discontinued       ? 2 g ?200 mL/hr over 30 Minutes Intravenous Every 8 hours 11/14/21 1105 11/23/21 1950  ? 11/14/21 1100  vancomycin (VANCOREADY) IVPB 1500 mg/300 mL  Status:  Discontinued       ? 1,500 mg ?150 mL/hr over 120 Minutes Intravenous Every 12 hours 11/14/21 1047 11/14/21 1102  ? ?  ? ? ?Subjective: ?C/o chronic back pain ? ?Objective: ?Vitals:  ? 12/04/21 2322 12/05/21 0417 12/05/21 0833 12/05/21 1220  ?BP: 112/67 109/63 121/71 125/73  ?Pulse: 100 95 (!) 114 (!) 103  ?Resp:  20 20 18   ?Temp: 98.4 ?F (36.9 ?C) 98.2 ?F (36.8 ?C) 97.8 ?F (36.6 ?C)   ?TempSrc: Oral Oral Oral   ?SpO2: 95% 96% 99% 97%  ?Weight:      ?Height:      ? ? ?Intake/Output Summary (Last 24 hours) at 12/05/2021 1648 ?Last data filed at 12/05/2021 1155 ?Gross per 24 hour  ?Intake 900 ml  ?Output --  ?Net 900 ml  ? ?Filed Weights  ? 11/14/21 1023 11/14/21 2000 11/28/21 1100  ?Weight: 70.8 kg 70.2 kg 68 kg  ? ? ?Examination: ? ?General exam: Appears calm and comfortable  ?Respiratory system: unlabored ?Cardiovascular system: RRR ?Central nervous system: Alert and oriented. Reginald Clarke focal neurological deficits. ?Extremities: Reginald Clarke LEE ? ? ? ?Data Reviewed: I have personally reviewed following labs and imaging studies ? ?CBC: ?Recent Labs  ?Lab 11/29/21 ?0207  ?WBC 8.2  ?HGB 8.4*  ?HCT 25.4*  ?MCV 91.0  ?PLT 208  ? ? ?Basic Metabolic Panel: ?Recent Labs  ?Lab 11/29/21 ?0207  ?NA 137  ?K 4.9  ?CL 104  ?CO2 29  ?GLUCOSE 93  ?BUN <5*  ?CREATININE 0.56*  ?CALCIUM 8.5*  ?MG 2.0  ? ? ?GFR: ?Estimated Creatinine Clearance: 129.9 mL/min (Reginald Clarke) (by C-G formula based on SCr of 0.56 mg/dL (L)). ? ?Liver Function Tests: ?Recent Labs  ?Lab 11/29/21 ?0207  ?AST 15  ?ALT 9  ?ALKPHOS 54  ?BILITOT 0.3  ?PROT 6.9  ?ALBUMIN 2.0*  ? ? ?CBG: ?Reginald Clarke results for input(s): GLUCAP in the last 168 hours. ? ? ?Recent Results (from the past 240 hour(s))  ?Fungus Culture With Stain     Status: None (Preliminary result)  ? Collection Time: 11/28/21  2:34 PM  ?  Specimen: Soft Tissue, Other  ?Result Value Ref Range Status  ? Fungus Stain Final report  Final  ?  Comment: (NOTE) ?Performed At: [facility] ?[address] [place], [id number] ?Rush Farmer MD [id number] ?  ? Fungus (Mycology) Culture PENDING  Incomplete  ? Fungal Source SPEC Kiyah Demartini  Final  ?  Comment: Performed at [facility], [place] [address], [place], [place] [address]  ?Aerobic/Anaerobic Culture w Gram Stain (surgical/deep wound)     Status: None  ? Collection Time: 11/28/21  2:34 PM  ? Specimen: Soft Tissue, Other  ?Result Value Ref Range Status  ? Specimen Description TISSUE  Final  ? Special Requests TRICUSPID VEGETATION SPEC Lorilyn Laitinen  Final  ? Gram Stain   Final  ?  RARE WBC PRESENT,BOTH PMN AND MONONUCLEAR ?Reginald Clarke ORGANISMS SEEN ?  ?  Culture   Final  ?  FEW STAPHYLOCOCCUS AUREUS ?CRITICAL RESULT CALLED TO, READ BACK BY AND VERIFIED WITH: Albin Felling RN, AT [phone] 11/29/21 D.VANHOOK REGARDING CULTURE GROWTH ?Reginald Clarke ANAEROBES ISOLATED ?Performed at [facility], [place] [address] [place], [place] [address] ?  ? Report Status 12/03/2021 FINAL  Final  ? Organism ID, Bacteria STAPHYLOCOCCUS AUREUS  Final  ?    Susceptibility  ? Staphylococcus aureus - MIC*  ?  CIPROFLOXACIN <=0.5 SENSITIVE Sensitive   ?  ERYTHROMYCIN >=8 RESISTANT Resistant   ?  GENTAMICIN <=0.5 SENSITIVE Sensitive   ?  OXACILLIN 0.5 SENSITIVE Sensitive   ?  TETRACYCLINE <=1 SENSITIVE Sensitive   ?  VANCOMYCIN 1 SENSITIVE Sensitive   ?  TRIMETH/SULFA <=10 SENSITIVE Sensitive   ?  CLINDAMYCIN <=0.25 SENSITIVE Sensitive   ?  RIFAMPIN <=0.5 SENSITIVE Sensitive   ?  Inducible Clindamycin NEGATIVE Sensitive   ?  * FEW STAPHYLOCOCCUS AUREUS  ?Acid Fast Smear (AFB)     Status: None  ? Collection Time: 11/28/21  2:34 PM  ? Specimen: Soft Tissue, Other  ?Result Value Ref Range Status  ? AFB Specimen Processing Concentration  Final  ? Acid Fast Smear Negative  Final  ?  Comment: (NOTE) ?Performed At: [facility] ?[address]  [place], [place] [id number] ?Rush Farmer MD [id number] ?  ? Source (AFB) TRICUSPID VEGETATION  Final  ?  Comment: Performed at [facility], [place] [address], [place], [place] [address]  ?Fungus Culture Result

## 2021-12-05 NOTE — Assessment & Plan Note (Signed)
As above.

## 2021-12-05 NOTE — Assessment & Plan Note (Addendum)
Will need outpatient treatment ?Follow up with ID ?

## 2021-12-06 DIAGNOSIS — B9561 Methicillin susceptible Staphylococcus aureus infection as the cause of diseases classified elsewhere: Secondary | ICD-10-CM | POA: Diagnosis not present

## 2021-12-06 DIAGNOSIS — R7881 Bacteremia: Secondary | ICD-10-CM | POA: Diagnosis not present

## 2021-12-06 NOTE — Plan of Care (Signed)
?  Problem: Health Behavior/Discharge Planning: ?Goal: Ability to manage health-related needs will improve ?Outcome: Progressing ?  ?Problem: Clinical Measurements: ?Goal: Ability to maintain clinical measurements within normal limits will improve ?12/06/2021 0104 by Saddie Benders, RN ?Outcome: Progressing ?12/06/2021 0056 by Saddie Benders, RN ?Outcome: Progressing ?  ?

## 2021-12-06 NOTE — Progress Notes (Signed)
?PROGRESS NOTE ? ? ? ?Reginald Clarke  [id number] DOB: Mar 13, 1991 DOA: 11/14/2021 ?PCP: Pcp, No  ?Chief Complaint  ?Patient presents with  ? abdnormal lab  ? ? ?Brief Narrative:  ?Patient is Reginald Clarke 31 years old male with past medical history of IV drug abuse, GERD, chronic pain on methadone, presented to the hospital with positive blood cultures.  Patient was recently diagnosed of having community-acquired pneumonia.  On this presentation, patient had MSSA bacteremia and further work-up showed tricuspid valve endocarditis with severe TR and septic pulmonary emboli.  TEE was performed on 11/23/2021 which showed large vegetation of the tricuspid valve.  ID and CT surgery followed the patient during hospitalization.  Currently on Ancef.  Status post angio VAC on 11/28/2021. Plan for IV antibiotics through 12/12/21 with discharge home and outpatient antibiotics per ID recs as below.  ? ? ?Assessment & Plan: ?  ?Principal Problem: ?  MSSA bacteremia ?Active Problems: ?  Endocarditis of tricuspid valve ?  Septic pulmonary embolism ([place]) ?  CAP (community acquired pneumonia) ?  Chronic pain syndrome ?  GERD (gastroesophageal reflux disease) ?  Hepatitis C, chronic ([place]) ?  Tobacco abuse ?  Polysubstance abuse ([place]) ?  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  Anxiety ? ? ?Assessment and Plan: ?* MSSA bacteremia ?As above ? ?Endocarditis of tricuspid valve ?Now s/p R femoral vein cannulation, R IJ vein cannulation, R heart cannulation, and debridement of R atrial mass and tricuspid valve vegetation on 4/26 with CT surgery -> good results with debridement, residual subvalvular component of vegetation which was not accessible, likely part of torn chordae. ?4/8 with staph aureus in urine ?MSSA 4/9 cultures ?4/12 cultures no growth ?Follow cx results from 4/26, few staph aureus ?ID recommending cefazolin until 5/10, then weekly dalbavancin to complete 6 weeks (he's unwilling to stay in hospital for 6 weeks) ? ? ? ? ?Septic pulmonary embolism  ([place]) ?Treating as above ? ?Chronic pain syndrome ?-chronic back pain, T/L spine without acute infection in spine ?-concern for layering R effusion on MRI, [place] without effusion ?-Reports actively follow as an outpatient for methadone therapy. ?-Continue home methadone dose. ?-will need to ensure he's able to resume this after discharge, will try to call his provider to confirm ? ?GERD (gastroesophageal reflux disease) ?-continue PPI. ? ?Hepatitis C, chronic ([place]) ?Will need outpatient treatment ? ?Anxiety ?Has xanax prn, will start to taper this as we approach discharge - he's been getting rather regularly since 4/23 ? ?Polysubstance abuse ([place]) ?- Cessation counseling provided ?-UDS done during this hospitalization demonstrated positive THC and amphetamines. ? ?Tobacco abuse ?-Smoking cessation counseling has been provided ?-Patient declined nicotine patch. ? ? ?DVT prophylaxis: lovenox ?Code Status: full ?Family Communication: none ?Disposition:  ? ?Status is: Inpatient ?Remains inpatient appropriate because: need for IV abx ?  ?Consultants:  ?CT surgery ?ID ? ?Procedures:  ?4/26 CT surgery ?-Right femoral vein cannulation with Ziara Thelander 18 F cannula ?- Right internal jugular vein cannulation with Khalidah Herbold 42F Sheath ?- Right heart cannulation ?- Debridement of right atrial mass ?- Debridement of tricuspid valve vegetation ? ?Antimicrobials:  ?Anti-infectives (From admission, onward)  ? ? Start     Dose/Rate Route Frequency Ordered Stop  ? 12/12/21 0800  Oritavancin Diphosphate (ORBACTIV) 1,200 mg in dextrose 5 % IVPB       ? 1,200 mg ?333.3 mL/hr over 180 Minutes Intravenous Once 12/04/21 1003    ? 11/24/21 0000  ceFAZolin (ANCEF) IVPB 2g/100 mL premix       ? 2  g ?200 mL/hr over 30 Minutes Intravenous Every 8 hours 11/23/21 1950 12/12/21 0800  ? 11/14/21 1400  ceFAZolin (ANCEF) injection 2 g  Status:  Discontinued       ? 2 g Intramuscular Every 8 hours 11/14/21 1050 11/14/21 1105  ? 11/14/21 1115  ceFAZolin (ANCEF) IVPB 2g/100  mL premix  Status:  Discontinued       ? 2 g ?200 mL/hr over 30 Minutes Intravenous Every 8 hours 11/14/21 1105 11/23/21 1950  ? 11/14/21 1100  vancomycin (VANCOREADY) IVPB 1500 mg/300 mL  Status:  Discontinued       ? 1,500 mg ?150 mL/hr over 120 Minutes Intravenous Every 12 hours 11/14/21 1047 11/14/21 1102  ? ?  ? ? ?Subjective: ?Discussed plan to taper xanax, he's anxious regarding this ? ?Objective: ?Vitals:  ? 12/05/21 1944 12/05/21 2316 12/06/21 0316 12/06/21 0841  ?BP: 112/68 108/63 102/67 (!) 99/57  ?Pulse: (!) 110 (!) 104 94 100  ?Resp: 17 18 17 18   ?Temp: 98.5 ?F (36.9 ?C) 98.6 ?F (37 ?C) 98.5 ?F (36.9 ?C) 98.3 ?F (36.8 ?C)  ?TempSrc: Oral Oral Oral Oral  ?SpO2: 97% 97% 98% 97%  ?Weight:      ?Height:      ? ? ?Intake/Output Summary (Last 24 hours) at 12/06/2021 1609 ?Last data filed at 12/06/2021 [phone] ?Gross per 24 hour  ?Intake 220 ml  ?Output --  ?Net 220 ml  ? ?Filed Weights  ? 11/14/21 1023 11/14/21 2000 11/28/21 1100  ?Weight: 70.8 kg 70.2 kg 68 kg  ? ? ?Examination: ? ?General: No acute distress. ?Lungs: unlabored ?Neurological: Alert and oriented ?3. Moves all extremities ?4 with equal strength. Cranial nerves II through XII grossly intact. ?Extremities: No clubbing or cyanosis. No edema.  ? ?Data Reviewed: I have personally reviewed following labs and imaging studies ? ?CBC: ?No results for input(s): WBC, NEUTROABS, HGB, HCT, MCV, PLT in the last 168 hours. ? ? ?Basic Metabolic Panel: ?No results for input(s): NA, K, CL, CO2, GLUCOSE, BUN, CREATININE, CALCIUM, MG, PHOS in the last 168 hours. ? ? ?GFR: ?Estimated Creatinine Clearance: 129.9 mL/min (Avin Gibbons) (by C-G formula based on SCr of 0.56 mg/dL (L)). ? ?Liver Function Tests: ?No results for input(s): AST, ALT, ALKPHOS, BILITOT, PROT, ALBUMIN in the last 168 hours. ? ? ?CBG: ?No results for input(s): GLUCAP in the last 168 hours. ? ? ?Recent Results (from the past 240 hour(s))  ?Fungus Culture With Stain     Status: None (Preliminary result)  ?  Collection Time: 11/28/21  2:34 PM  ? Specimen: Soft Tissue, Other  ?Result Value Ref Range Status  ? Fungus Stain Final report  Final  ?  Comment: (NOTE) ?Performed At: [facility] ?[address] [place], [place] ?[id number] MD Jolene Schimke ?  ? Fungus (Mycology) Culture PENDING  Incomplete  ? Fungal Source SPEC Veyda Kaufman  Final  ?  Comment: Performed at [facility], [address], [place], [place]  ?Aerobic/Anaerobic Culture w Gram Stain (surgical/deep wound)     Status: None  ? Collection Time: 11/28/21  2:34 PM  ? Specimen: Soft Tissue, Other  ?Result Value Ref Range Status  ? Specimen Description TISSUE  Final  ? Special Requests TRICUSPID VEGETATION SPEC Arbadella Kimbler  Final  ? Gram Stain   Final  ?  RARE WBC PRESENT,BOTH PMN AND MONONUCLEAR ?NO ORGANISMS SEEN ?  ? Culture   Final  ?  FEW STAPHYLOCOCCUS AUREUS ?CRITICAL RESULT CALLED TO, READ BACK  BY AND VERIFIED WITH: Donnetta Hutching RN, AT [phone] 11/29/21 D.VANHOOK REGARDING CULTURE GROWTH ?NO ANAEROBES ISOLATED ?Performed at [facility], [address] [place], [place] [address] ?  ? Report Status 12/03/2021 FINAL  Final  ? Organism ID, Bacteria STAPHYLOCOCCUS AUREUS  Final  ?    Susceptibility  ? Staphylococcus aureus - MIC*  ?  CIPROFLOXACIN <=0.5 SENSITIVE Sensitive   ?  ERYTHROMYCIN >=8 RESISTANT Resistant   ?  GENTAMICIN <=0.5 SENSITIVE Sensitive   ?  OXACILLIN 0.5 SENSITIVE Sensitive   ?  TETRACYCLINE <=1 SENSITIVE Sensitive   ?  VANCOMYCIN 1 SENSITIVE Sensitive   ?  TRIMETH/SULFA <=10 SENSITIVE Sensitive   ?  CLINDAMYCIN <=0.25 SENSITIVE Sensitive   ?  RIFAMPIN <=0.5 SENSITIVE Sensitive   ?  Inducible Clindamycin NEGATIVE Sensitive   ?  * FEW STAPHYLOCOCCUS AUREUS  ?Acid Fast Smear (AFB)     Status: None  ? Collection Time: 11/28/21  2:34 PM  ? Specimen: Soft Tissue, Other  ?Result Value Ref Range Status  ? AFB Specimen Processing Concentration  Final  ? Acid Fast Smear Negative  Final  ?  Comment: (NOTE) ?Performed At: [facility] ?[address] [place], [place] [id number] ?Jolene Schimke MD PF:[id number] ?  ? Source (AFB) TRICUSPID VEGETATION  Final  ?  Comment: Performed at [facility], [address], [place]

## 2021-12-06 NOTE — Plan of Care (Signed)
  Problem: Clinical Measurements: Goal: Ability to maintain clinical measurements within normal limits will improve Outcome: Progressing   

## 2021-12-07 DIAGNOSIS — R7881 Bacteremia: Secondary | ICD-10-CM | POA: Diagnosis not present

## 2021-12-07 DIAGNOSIS — B9561 Methicillin susceptible Staphylococcus aureus infection as the cause of diseases classified elsewhere: Secondary | ICD-10-CM | POA: Diagnosis not present

## 2021-12-07 LAB — HCV FIBROSURE
ALPHA 2-MACROGLOBULINS, QN: 148 mg/dL (ref 110–276)
ALT (SGPT) P5P: 18 IU/L (ref 0–55)
Apolipoprotein A-1: 115 mg/dL (ref 101–178)
Bilirubin, Total: 0.1 mg/dL (ref 0.0–1.2)
Fibrosis Score: 0.03 (ref 0.00–0.21)
GGT: 27 IU/L (ref 0–65)
Haptoglobin: 236 mg/dL (ref 17–317)
Necroinflammat Activity Score: 0.05 (ref 0.00–0.17)

## 2021-12-07 LAB — CBC WITH DIFFERENTIAL/PLATELET
Abs Immature Granulocytes: 0.05 10*3/uL (ref 0.00–0.07)
Basophils Absolute: 0 10*3/uL (ref 0.0–0.1)
Basophils Relative: 1 %
Eosinophils Absolute: 0.3 10*3/uL (ref 0.0–0.5)
Eosinophils Relative: 5 %
HCT: 27.3 % — ABNORMAL LOW (ref 39.0–52.0)
Hemoglobin: 8.9 g/dL — ABNORMAL LOW (ref 13.0–17.0)
Immature Granulocytes: 1 %
Lymphocytes Relative: 30 %
Lymphs Abs: 1.8 10*3/uL (ref 0.7–4.0)
MCH: 30.7 pg (ref 26.0–34.0)
MCHC: 32.6 g/dL (ref 30.0–36.0)
MCV: 94.1 fL (ref 80.0–100.0)
Monocytes Absolute: 0.5 10*3/uL (ref 0.1–1.0)
Monocytes Relative: 8 %
Neutro Abs: 3.3 10*3/uL (ref 1.7–7.7)
Neutrophils Relative %: 55 %
Platelets: 235 10*3/uL (ref 150–400)
RBC: 2.9 MIL/uL — ABNORMAL LOW (ref 4.22–5.81)
RDW: 15.2 % (ref 11.5–15.5)
WBC: 6 10*3/uL (ref 4.0–10.5)
nRBC: 0 % (ref 0.0–0.2)

## 2021-12-07 LAB — COMPREHENSIVE METABOLIC PANEL
ALT: 9 U/L (ref 0–44)
AST: 20 U/L (ref 15–41)
Albumin: 2.5 g/dL — ABNORMAL LOW (ref 3.5–5.0)
Alkaline Phosphatase: 70 U/L (ref 38–126)
Anion gap: 6 (ref 5–15)
BUN: 14 mg/dL (ref 6–20)
CO2: 30 mmol/L (ref 22–32)
Calcium: 8.8 mg/dL — ABNORMAL LOW (ref 8.9–10.3)
Chloride: 102 mmol/L (ref 98–111)
Creatinine, Ser: 0.79 mg/dL (ref 0.61–1.24)
GFR, Estimated: >60 mL/min (ref >60)
Glucose, Bld: 94 mg/dL (ref 70–99)
Potassium: 3.9 mmol/L (ref 3.5–5.1)
Sodium: 138 mmol/L (ref 135–145)
Total Bilirubin: 0.3 mg/dL (ref 0.3–1.2)
Total Protein: 6.4 g/dL — ABNORMAL LOW (ref 6.5–8.1)

## 2021-12-07 LAB — PHOSPHORUS: Phosphorus: 4.5 mg/dL (ref 2.5–4.6)

## 2021-12-07 LAB — HEPATITIS C GENOTYPE

## 2021-12-07 LAB — MAGNESIUM: Magnesium: 1.7 mg/dL (ref 1.7–2.4)

## 2021-12-07 NOTE — Progress Notes (Signed)
?PROGRESS NOTE ? ? ? ?Reginald Clarke  [id number] DOB: Mar 13, 1991 DOA: 11/14/2021 ?PCP: Pcp, No  ?Chief Complaint  ?Patient presents with  ? abdnormal lab  ? ? ?Brief Narrative:  ?Patient is Reginald Clarke 31 years old male with past medical history of IV drug abuse, GERD, chronic pain on methadone, presented to the hospital with positive blood cultures.  Patient was recently diagnosed of having community-acquired pneumonia.  On this presentation, patient had MSSA bacteremia and further work-up showed tricuspid valve endocarditis with severe TR and septic pulmonary emboli.  TEE was performed on 11/23/2021 which showed large vegetation of the tricuspid valve.  ID and CT surgery followed the patient during hospitalization.  Currently on Ancef.  Status post angio VAC on 11/28/2021. Plan for IV antibiotics through 12/12/21 with discharge home and outpatient antibiotics per ID recs as below.  ? ? ?Assessment & Plan: ?  ?Principal Problem: ?  MSSA bacteremia ?Active Problems: ?  Endocarditis of tricuspid valve ?  Septic pulmonary embolism ([place]) ?  CAP (community acquired pneumonia) ?  Chronic pain syndrome ?  GERD (gastroesophageal reflux disease) ?  Hepatitis C, chronic ([place]) ?  Tobacco abuse ?  Polysubstance abuse ([place]) ?  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  Anxiety ? ? ?Assessment and Plan: ?* MSSA bacteremia ?As above ? ?Endocarditis of tricuspid valve ?Now s/p R femoral vein cannulation, R IJ vein cannulation, R heart cannulation, and debridement of R atrial mass and tricuspid valve vegetation on 4/26 with CT surgery -> good results with debridement, residual subvalvular component of vegetation which was not accessible, likely part of torn chordae. ?4/8 with staph aureus in urine ?MSSA 4/9 cultures ?4/12 cultures no growth ?Follow cx results from 4/26, few staph aureus ?ID recommending cefazolin until 5/10, then weekly dalbavancin to complete 6 weeks (he's unwilling to stay in hospital for 6 weeks) ? ? ? ? ?Septic pulmonary embolism  ([place]) ?Treating as above ? ?Chronic pain syndrome ?-chronic back pain, T/L spine without acute infection in spine ?-concern for layering R effusion on MRI, [place] without effusion ?-Reports actively follow as an outpatient for methadone therapy. ?-Continue home methadone dose. ?-will need to ensure he's able to resume this after discharge, will try to call his provider to confirm ? ?GERD (gastroesophageal reflux disease) ?-continue PPI. ? ?Hepatitis C, chronic ([place]) ?Will need outpatient treatment ? ?Anxiety ?Has xanax prn, will start to taper this as we approach discharge - he's been getting rather regularly since 4/23 ? ?Polysubstance abuse ([place]) ?- Cessation counseling provided ?-UDS done during this hospitalization demonstrated positive THC and amphetamines. ? ?Tobacco abuse ?-Smoking cessation counseling has been provided ?-Patient declined nicotine patch. ? ? ?DVT prophylaxis: lovenox ?Code Status: full ?Family Communication: none ?Disposition:  ? ?Status is: Inpatient ?Remains inpatient appropriate because: need for IV abx ?  ?Consultants:  ?CT surgery ?ID ? ?Procedures:  ?4/26 CT surgery ?-Right femoral vein cannulation with Hurbert Duran 18 F cannula ?- Right internal jugular vein cannulation with Tyjai Matuszak 42F Sheath ?- Right heart cannulation ?- Debridement of right atrial mass ?- Debridement of tricuspid valve vegetation ? ?Antimicrobials:  ?Anti-infectives (From admission, onward)  ? ? Start     Dose/Rate Route Frequency Ordered Stop  ? 12/12/21 0800  Oritavancin Diphosphate (ORBACTIV) 1,200 mg in dextrose 5 % IVPB       ? 1,200 mg ?333.3 mL/hr over 180 Minutes Intravenous Once 12/04/21 1003    ? 11/24/21 0000  ceFAZolin (ANCEF) IVPB 2g/100 mL premix       ? 2  g ?200 mL/hr over 30 Minutes Intravenous Every 8 hours 11/23/21 1950 12/12/21 0800  ? 11/14/21 1400  ceFAZolin (ANCEF) injection 2 g  Status:  Discontinued       ? 2 g Intramuscular Every 8 hours 11/14/21 1050 11/14/21 1105  ? 11/14/21 1115  ceFAZolin (ANCEF) IVPB 2g/100  mL premix  Status:  Discontinued       ? 2 g ?200 mL/hr over 30 Minutes Intravenous Every 8 hours 11/14/21 1105 11/23/21 1950  ? 11/14/21 1100  vancomycin (VANCOREADY) IVPB 1500 mg/300 mL  Status:  Discontinued       ? 1,500 mg ?150 mL/hr over 120 Minutes Intravenous Every 12 hours 11/14/21 1047 11/14/21 1102  ? ?  ? ? ?Subjective: ?No new complaints ?Grandmother at bedside today ? ?Objective: ?Vitals:  ? 12/06/21 1922 12/07/21 0518 12/07/21 0826 12/07/21 1138  ?BP: 101/60 (!) 101/57 (!) 104/59   ?Pulse: (!) 108 87 94   ?Resp: 19 18 19 19   ?Temp: 97.6 ?F (36.4 ?C) 98.3 ?F (36.8 ?C) 98 ?F (36.7 ?C)   ?TempSrc: Oral Oral Oral   ?SpO2: 99% 99% 96% 100%  ?Weight:      ?Height:      ? ? ?Intake/Output Summary (Last 24 hours) at 12/07/2021 1415 ?Last data filed at 12/07/2021 0049 ?Gross per 24 hour  ?Intake 400 ml  ?Output --  ?Net 400 ml  ? ?Filed Weights  ? 11/14/21 1023 11/14/21 2000 11/28/21 1100  ?Weight: 70.8 kg 70.2 kg 68 kg  ? ? ?Examination: ? ?General: No acute distress. ?Cardiovascular: RRR ?Lungs: unlabored ?Neurological: Alert and oriented ?3. Moves all extremities ?4. Cranial nerves II through XII grossly intact. ?Skin: Warm and dry. No rashes or lesions. ?Extremities: No clubbing or cyanosis. No edema.  ? ?Data Reviewed: I have personally reviewed following labs and imaging studies ? ?CBC: ?Recent Labs  ?Lab 12/07/21 ?0242  ?WBC 6.0  ?NEUTROABS 3.3  ?HGB 8.9*  ?HCT 27.3*  ?MCV 94.1  ?PLT 235  ? ? ? ?Basic Metabolic Panel: ?Recent Labs  ?Lab 12/07/21 ?0242  ?NA 138  ?K 3.9  ?CL 102  ?CO2 30  ?GLUCOSE 94  ?BUN 14  ?CREATININE 0.79  ?CALCIUM 8.8*  ?MG 1.7  ?PHOS 4.5  ? ? ? ?GFR: ?Estimated Creatinine Clearance: 129.9 mL/min (by C-G formula based on SCr of 0.79 mg/dL). ? ?Liver Function Tests: ?Recent Labs  ?Lab 12/07/21 ?0242  ?AST 20  ?ALT 9  ?ALKPHOS 70  ?BILITOT 0.3  ?PROT 6.4*  ?ALBUMIN 2.5*  ? ? ? ?CBG: ?No results for input(s): GLUCAP in the last 168 hours. ? ? ?Recent Results (from the past 240 hour(s))   ?Fungus Culture With Stain     Status: None (Preliminary result)  ? Collection Time: 11/28/21  2:34 PM  ? Specimen: Soft Tissue, Other  ?Result Value Ref Range Status  ? Fungus Stain Final report  Final  ?  Comment: (NOTE) ?Performed At: [facility] ?[address] [place], [place] ?[id number] MD Jolene Schimke ?  ? Fungus (Mycology) Culture PENDING  Incomplete  ? Fungal Source SPEC Leovanni Bjorkman  Final  ?  Comment: Performed at [facility], [address] [place], [place]  ?Aerobic/Anaerobic Culture w Gram Stain (surgical/deep wound)     Status: None  ? Collection Time: 11/28/21  2:34 PM  ? Specimen: Soft Tissue, Other  ?Result Value Ref Range Status  ? Specimen Description TISSUE  Final  ? Special Requests TRICUSPID VEGETATION SPEC  Yussef Jorge  Final  ? Gram Stain   Final  ?  RARE WBC PRESENT,BOTH PMN AND MONONUCLEAR ?NO ORGANISMS SEEN ?  ? Culture   Final  ?  FEW STAPHYLOCOCCUS AUREUS ?CRITICAL RESULT CALLED TO, READ BACK BY AND VERIFIED WITH: Donnetta Hutching RN, AT [phone] 11/29/21 D.VANHOOK REGARDING CULTURE GROWTH ?NO ANAEROBES ISOLATED ?Performed at [facility], [address] [place], [place] [address] ?  ? Report Status 12/03/2021 FINAL  Final  ? Organism ID, Bacteria STAPHYLOCOCCUS AUREUS  Final  ?    Susceptibility  ? Staphylococcus aureus - MIC*  ?  CIPROFLOXACIN <=0.5 SENSITIVE Sensitive   ?  ERYTHROMYCIN >=8 RESISTANT Resistant   ?  GENTAMICIN <=0.5 SENSITIVE Sensitive   ?  OXACILLIN 0.5 SENSITIVE Sensitive   ?  TETRACYCLINE <=1 SENSITIVE Sensitive   ?  VANCOMYCIN 1 SENSITIVE Sensitive   ?  TRIMETH/SULFA <=10 SENSITIVE Sensitive   ?  CLINDAMYCIN <=0.25 SENSITIVE Sensitive   ?  RIFAMPIN <=0.5 SENSITIVE Sensitive   ?  Inducible Clindamycin NEGATIVE Sensitive   ?  * FEW STAPHYLOCOCCUS AUREUS  ?Acid Fast Smear (AFB)     Status: None  ? Collection Time: 11/28/21  2:34 PM  ? Specimen: Soft Tissue, Other  ?Result Value Ref Range Status  ? AFB Specimen Processing Concentration  Final  ? Acid  Fast Smear Negative  Final  ?  Comment: (NOTE) ?Performed At: [facility] ?[address] [place], [place] [id number] ?Jolene Schimke MD [id number] ?  ? Source (AFB) TRICUSPID VEGETATION  Final  ?

## 2021-12-08 DIAGNOSIS — R7881 Bacteremia: Secondary | ICD-10-CM | POA: Diagnosis not present

## 2021-12-08 DIAGNOSIS — B9561 Methicillin susceptible Staphylococcus aureus infection as the cause of diseases classified elsewhere: Secondary | ICD-10-CM | POA: Diagnosis not present

## 2021-12-08 NOTE — Progress Notes (Signed)
?PROGRESS NOTE ? ? ? ?Reginald Clarke  [id number] DOB: Mar 13, 1991 DOA: 11/14/2021 ?PCP: Pcp, No  ?Chief Complaint  ?Patient presents with  ? abdnormal lab  ? ? ?Brief Narrative:  ?Patient is Reginald Clarke 31 years old male with past medical history of IV drug abuse, GERD, chronic pain on methadone, presented to the hospital with positive blood cultures.  Patient was recently diagnosed of having community-acquired pneumonia.  On this presentation, patient had MSSA bacteremia and further work-up showed tricuspid valve endocarditis with severe TR and septic pulmonary emboli.  TEE was performed on 11/23/2021 which showed large vegetation of the tricuspid valve.  ID and CT surgery followed the patient during hospitalization.  Currently on Ancef.  Status post angio VAC on 11/28/2021. Plan for IV antibiotics through 12/12/21 with discharge home and outpatient antibiotics per ID recs as below.  ? ? ?Assessment & Plan: ?  ?Principal Problem: ?  MSSA bacteremia ?Active Problems: ?  Endocarditis of tricuspid valve ?  Septic pulmonary embolism (Reginald Clarke) ?  CAP (community acquired pneumonia) ?  Chronic pain syndrome ?  GERD (gastroesophageal reflux disease) ?  Hepatitis C, chronic (Reginald Clarke) ?  Tobacco abuse ?  Polysubstance abuse (Reginald Clarke) ?  Methadone dependence (Reginald Clarke) ?  Anxiety ? ? ?Assessment and Plan: ?* MSSA bacteremia ?As above ? ?Endocarditis of tricuspid valve ?Now s/p R femoral vein cannulation, R IJ vein cannulation, R heart cannulation, and debridement of R atrial mass and tricuspid valve vegetation on 4/26 with CT surgery -> good results with debridement, residual subvalvular component of vegetation which was not accessible, likely part of torn chordae. ?4/8 with staph aureus in urine ?MSSA 4/9 cultures ?4/12 cultures no growth ?Follow cx results from 4/26, few staph aureus ?ID recommending cefazolin until 5/10, then weekly dalbavancin to complete 6 weeks (he's unwilling to stay in hospital for 6 weeks) ? ? ? ? ?Septic pulmonary embolism  ([place]) ?Treating as above ? ?Chronic pain syndrome ?-chronic back pain, T/L spine without acute infection in spine ?-concern for layering R effusion on MRI, [place] without effusion ?-Reports actively follow as an outpatient for methadone therapy. ?-Continue home methadone dose. ?-will need to ensure he's able to resume this after discharge, will try to call his provider to confirm ? ?GERD (gastroesophageal reflux disease) ?-continue PPI. ? ?Hepatitis C, chronic (Reginald Clarke) ?Will need outpatient treatment ? ?Anxiety ?Has xanax prn, will start to taper this as we approach discharge - he's been getting rather regularly since 4/23 ? ?Polysubstance abuse (Reginald Clarke) ?- Cessation counseling provided ?-UDS done during this hospitalization demonstrated positive THC and amphetamines. ? ?Tobacco abuse ?-Smoking cessation counseling has been provided ?-Patient declined nicotine patch. ? ? ?DVT prophylaxis: lovenox ?Code Status: full ?Family Communication: none ?Disposition:  ? ?Status is: Inpatient ?Remains inpatient appropriate because: need for IV abx ?  ?Consultants:  ?CT surgery ?ID ? ?Procedures:  ?4/26 CT surgery ?-Right femoral vein cannulation with Stephen Turnbaugh 18 F cannula ?- Right internal jugular vein cannulation with Tajuan Dufault 42F Sheath ?- Right heart cannulation ?- Debridement of right atrial mass ?- Debridement of tricuspid valve vegetation ? ?Antimicrobials:  ?Anti-infectives (From admission, onward)  ? ? Start     Dose/Rate Route Frequency Ordered Stop  ? 12/12/21 0800  Oritavancin Diphosphate (ORBACTIV) 1,200 mg in dextrose 5 % IVPB       ? 1,200 mg ?333.3 mL/hr over 180 Minutes Intravenous Once 12/04/21 1003    ? 11/24/21 0000  ceFAZolin (ANCEF) IVPB 2g/100 mL premix       ? 2  g ?200 mL/hr over 30 Minutes Intravenous Every 8 hours 11/23/21 1950 12/12/21 0800  ? 11/14/21 1400  ceFAZolin (ANCEF) injection 2 g  Status:  Discontinued       ? 2 g Intramuscular Every 8 hours 11/14/21 1050 11/14/21 1105  ? 11/14/21 1115  ceFAZolin (ANCEF) IVPB 2g/100  mL premix  Status:  Discontinued       ? 2 g ?200 mL/hr over 30 Minutes Intravenous Every 8 hours 11/14/21 1105 11/23/21 1950  ? 11/14/21 1100  vancomycin (VANCOREADY) IVPB 1500 mg/300 mL  Status:  Discontinued       ? 1,500 mg ?150 mL/hr over 120 Minutes Intravenous Every 12 hours 11/14/21 1047 11/14/21 1102  ? ?  ? ? ?Subjective: ?Asking to be d/c'd from IV so he can use bathroom ?No other complaints ? ?Objective: ?Vitals:  ? 12/07/21 1920 12/07/21 2300 12/08/21 0340 12/08/21 0754  ?BP: 106/62 (!) 106/59 104/66 98/62  ?Pulse: 89 80 97 92  ?Resp: 18 18 18 16   ?Temp: 98.2 ?F (36.8 ?C) 98.4 ?F (36.9 ?C) 97.9 ?F (36.6 ?C) 97.9 ?F (36.6 ?C)  ?TempSrc: Oral Oral Oral Oral  ?SpO2: 97% 99% 99% 98%  ?Weight:      ?Height:      ? ? ?Intake/Output Summary (Last 24 hours) at 12/08/2021 1426 ?Last data filed at 12/07/2021 2000 ?Gross per 24 hour  ?Intake 240 ml  ?Output --  ?Net 240 ml  ? ?Filed Weights  ? 11/14/21 1023 11/14/21 2000 11/28/21 1100  ?Weight: 70.8 kg 70.2 kg 68 kg  ? ? ?Examination: ? ?General: No acute distress. ?Lungs: unlabored ?Neurological: Alert and oriented ?3. Moves all extremities ?4. Cranial nerves II through XII grossly intact. ?Psych: normal mood/affect ? ?Data Reviewed: I have personally reviewed following labs and imaging studies ? ?CBC: ?Recent Labs  ?Lab 12/07/21 ?0242  ?WBC 6.0  ?NEUTROABS 3.3  ?HGB 8.9*  ?HCT 27.3*  ?MCV 94.1  ?PLT 235  ? ? ? ?Basic Metabolic Panel: ?Recent Labs  ?Lab 12/07/21 ?0242  ?NA 138  ?K 3.9  ?CL 102  ?CO2 30  ?GLUCOSE 94  ?BUN 14  ?CREATININE 0.79  ?CALCIUM 8.8*  ?MG 1.7  ?PHOS 4.5  ? ? ? ?GFR: ?Estimated Creatinine Clearance: 129.9 mL/min (by C-G formula based on SCr of 0.79 mg/dL). ? ?Liver Function Tests: ?Recent Labs  ?Lab 12/07/21 ?0242  ?AST 20  ?ALT 9  ?ALKPHOS 70  ?BILITOT 0.3  ?PROT 6.4*  ?ALBUMIN 2.5*  ? ? ? ?CBG: ?No results for input(s): GLUCAP in the last 168 hours. ? ? ?Recent Results (from the past 240 hour(s))  ?Fungus Culture With Stain     Status: None  (Preliminary result)  ? Collection Time: 11/28/21  2:34 PM  ? Specimen: Soft Tissue, Other  ?Result Value Ref Range Status  ? Fungus Stain Final report  Final  ?  Comment: (NOTE) ?Performed At: [facility] ?[address] [place], [place] ?[id number] MD Jolene Schimke ?  ? Fungus (Mycology) Culture PENDING  Incomplete  ? Fungal Source SPEC Camiah Humm  Final  ?  Comment: Performed at [facility], [address], [place], [place]  ?Aerobic/Anaerobic Culture w Gram Stain (surgical/deep wound)     Status: None  ? Collection Time: 11/28/21  2:34 PM  ? Specimen: Soft Tissue, Other  ?Result Value Ref Range Status  ? Specimen Description TISSUE  Final  ? Special Requests TRICUSPID VEGETATION SPEC Sya Nestler  Final  ? Gram Stain  Final  ?  RARE WBC PRESENT,BOTH PMN AND MONONUCLEAR ?NO ORGANISMS SEEN ?  ? Culture   Final  ?  FEW STAPHYLOCOCCUS AUREUS ?CRITICAL RESULT CALLED TO, READ BACK BY AND VERIFIED WITH: Donnetta Hutching RN, AT [phone] 11/29/21 D.VANHOOK REGARDING CULTURE GROWTH ?NO ANAEROBES ISOLATED ?Performed at [facility], [address], [place], [place] [address] ?  ? Report Status 12/03/2021 FINAL  Final  ? Organism ID, Bacteria STAPHYLOCOCCUS AUREUS  Final  ?    Susceptibility  ? Staphylococcus aureus - MIC*  ?  CIPROFLOXACIN <=0.5 SENSITIVE Sensitive   ?  ERYTHROMYCIN >=8 RESISTANT Resistant   ?  GENTAMICIN <=0.5 SENSITIVE Sensitive   ?  OXACILLIN 0.5 SENSITIVE Sensitive   ?  TETRACYCLINE <=1 SENSITIVE Sensitive   ?  VANCOMYCIN 1 SENSITIVE Sensitive   ?  TRIMETH/SULFA <=10 SENSITIVE Sensitive   ?  CLINDAMYCIN <=0.25 SENSITIVE Sensitive   ?  RIFAMPIN <=0.5 SENSITIVE Sensitive   ?  Inducible Clindamycin NEGATIVE Sensitive   ?  * FEW STAPHYLOCOCCUS AUREUS  ?Acid Fast Smear (AFB)     Status: None  ? Collection Time: 11/28/21  2:34 PM  ? Specimen: Soft Tissue, Other  ?Result Value Ref Range Status  ? AFB Specimen Processing Concentration  Final  ? Acid Fast Smear Negative  Final  ?  Comment:  (NOTE) ?Performed At: [facility] ?[address] [place], [place] [id number] ?Jolene Schimke MD FO:[id number] ?  ? Source (AFB) TRICUSPID VEGETATION  Final  ?  Comment: Performed at [facility]

## 2021-12-09 DIAGNOSIS — R7881 Bacteremia: Secondary | ICD-10-CM | POA: Diagnosis not present

## 2021-12-09 DIAGNOSIS — B9561 Methicillin susceptible Staphylococcus aureus infection as the cause of diseases classified elsewhere: Secondary | ICD-10-CM | POA: Diagnosis not present

## 2021-12-09 MED ORDER — ALPRAZOLAM 0.5 MG PO TABS
0.5000 mg | ORAL_TABLET | Freq: Two times a day (BID) | ORAL | Status: DC | PRN
  Administered 2021-12-09 – 2021-12-11 (×4): 0.5 mg via ORAL
  Filled 2021-12-09 (×3): qty 1

## 2021-12-09 MED ORDER — TRAZODONE HCL 100 MG PO TABS
100.0000 mg | ORAL_TABLET | Freq: Once | ORAL | Status: AC
  Administered 2021-12-09: 100 mg via ORAL
  Filled 2021-12-09: qty 1

## 2021-12-09 NOTE — Progress Notes (Signed)
HOSPITAL MEDICINE OVERNIGHT EVENT NOTE   ? ?Notified by nursing that patient is exhibiting some degree of anxiety and is requesting an additional dose of Xanax. ? ?Chart reviewed, it is clear that the daytime attending is attempting to provide the patient with a tapering regimen of Xanax and therefore I will not give the patient an additional dose of Xanax at this time. ? ?I instead offered to give the patient a sleep aid such as a dose of trazodone which should hopefully serve as a type of anxiolytic and help the patient rest this evening.  Patient has accepted this alternative and therefore will order 100 mg of trazodone x1.  We will additionally obtain an EKG to reassess QT interval. ? ?Marinda Elk  MD ?[facility]  ? ? ? ? ? ? ? ? ? ? ?

## 2021-12-09 NOTE — Progress Notes (Signed)
?PROGRESS NOTE ? ? ? ?Reginald Clarke  [id number] DOB: Mar 13, 1991 DOA: 11/14/2021 ?PCP: Pcp, No  ?Chief Complaint  ?Patient presents with  ? abdnormal lab  ? ? ?Brief Narrative:  ?Patient is Reginald Clarke 31 years old male with past medical history of IV drug abuse, GERD, chronic pain on methadone, presented to the hospital with positive blood cultures.  Patient was recently diagnosed of having community-acquired pneumonia.  On this presentation, patient had MSSA bacteremia and further work-up showed tricuspid valve endocarditis with severe TR and septic pulmonary emboli.  TEE was performed on 11/23/2021 which showed large vegetation of the tricuspid valve.  ID and CT surgery followed the patient during hospitalization.  Currently on Ancef.  Status post angio VAC on 11/28/2021. Plan for IV antibiotics through 12/12/21 with discharge home and outpatient antibiotics per ID recs as below.  ? ? ?Assessment & Plan: ?  ?Principal Problem: ?  MSSA bacteremia ?Active Problems: ?  Endocarditis of tricuspid valve ?  Septic pulmonary embolism (Reginald Clarke) ?  CAP (community acquired pneumonia) ?  Chronic pain syndrome ?  GERD (gastroesophageal reflux disease) ?  Hepatitis C, chronic (Reginald Clarke) ?  Tobacco abuse ?  Polysubstance abuse ([place]) ?  Methadone dependence (Reginald [place]) ?  Anxiety ? ? ?Assessment and Plan: ?* MSSA bacteremia ?As above ? ?Endocarditis of tricuspid valve ?Now s/p R femoral vein cannulation, R IJ vein cannulation, R heart cannulation, and debridement of R atrial mass and tricuspid valve vegetation on 4/26 with CT surgery -> good results with debridement, residual subvalvular component of vegetation which was not accessible, likely part of torn chordae. ?4/8 with staph aureus in urine ?MSSA 4/9 cultures ?4/12 cultures no growth ?Follow cx results from 4/26, few staph aureus ?ID recommending cefazolin until 5/10, then weekly dalbavancin to complete 6 weeks (he's unwilling to stay in hospital for 6 weeks) ? ? ? ? ?Septic pulmonary embolism  (Reginald Clarke) ?Treating as above ? ?Chronic pain syndrome ?-chronic back pain, T/L spine without acute infection in spine ?-concern for layering R effusion on MRI, [place] without effusion ?-Reports actively follow as an outpatient for methadone therapy. ?-Continue home methadone dose. ?-will need to ensure he's able to resume this after discharge, will try to call his provider to confirm ? ?GERD (gastroesophageal reflux disease) ?-continue PPI. ? ?Hepatitis C, chronic (Reginald Clarke) ?Will need outpatient treatment ? ?Anxiety ?Has xanax prn, will start to taper this as we approach discharge - he's been getting rather regularly since 4/23 ? ?Polysubstance abuse (Reginald Clarke) ?- Cessation counseling provided ?-UDS done during this hospitalization demonstrated positive THC and amphetamines. ? ?Tobacco abuse ?-Smoking cessation counseling has been provided ?-Patient declined nicotine patch. ? ? ?DVT prophylaxis: lovenox ?Code Status: full ?Family Communication: none ?Disposition:  ? ?Status is: Inpatient ?Remains inpatient appropriate because: need for IV abx ?  ?Consultants:  ?CT surgery ?ID ? ?Procedures:  ?4/26 CT surgery ?-Right femoral vein cannulation with Reginald Clarke 18 F cannula ?- Right internal jugular vein cannulation with Reginald Clarke 42F Sheath ?- Right heart cannulation ?- Debridement of right atrial mass ?- Debridement of tricuspid valve vegetation ? ?Antimicrobials:  ?Anti-infectives (From admission, onward)  ? ? Start     Dose/Rate Route Frequency Ordered Stop  ? 12/12/21 0800  Oritavancin Diphosphate (ORBACTIV) 1,200 mg in dextrose 5 % IVPB       ? 1,200 mg ?333.3 mL/hr over 180 Minutes Intravenous Once 12/04/21 1003    ? 11/24/21 0000  ceFAZolin (ANCEF) IVPB 2g/100 mL premix       ? 2  g ?200 mL/hr over 30 Minutes Intravenous Every 8 hours 11/23/21 1950 12/12/21 0800  ? 11/14/21 1400  ceFAZolin (ANCEF) injection 2 g  Status:  Discontinued       ? 2 g Intramuscular Every 8 hours 11/14/21 1050 11/14/21 1105  ? 11/14/21 1115  ceFAZolin (ANCEF) IVPB 2g/100  mL premix  Status:  Discontinued       ? 2 g ?200 mL/hr over 30 Minutes Intravenous Every 8 hours 11/14/21 1105 11/23/21 1950  ? 11/14/21 1100  vancomycin (VANCOREADY) IVPB 1500 mg/300 mL  Status:  Discontinued       ? 1,500 mg ?150 mL/hr over 120 Minutes Intravenous Every 12 hours 11/14/21 1047 11/14/21 1102  ? ?  ? ? ?Subjective: ?No complaints ? ?Objective: ?Vitals:  ? 12/08/21 1646 12/08/21 1935 12/08/21 2255 12/09/21 0515  ?BP: 111/65 105/63 101/72 110/69  ?Pulse: 96 94 98 97  ?Resp: 18 19 18 18   ?Temp: 98 ?F (36.7 ?C) 98 ?F (36.7 ?C) 98 ?F (36.7 ?C) 98.5 ?F (36.9 ?C)  ?TempSrc: Oral Oral Oral Oral  ?SpO2: 99% 96% 96% 97%  ?Weight:      ?Height:      ? ? ?Intake/Output Summary (Last 24 hours) at 12/09/2021 1200 ?Last data filed at 12/08/2021 1935 ?Gross per 24 hour  ?Intake 120 ml  ?Output --  ?Net 120 ml  ? ?Filed Weights  ? 11/14/21 1023 11/14/21 2000 11/28/21 1100  ?Weight: 70.8 kg 70.2 kg 68 kg  ? ? ?Examination: ? ?General: No acute distress. ?Cardiovascular: RRR ?Lungs: unlabored ?Neurological: Alert and oriented ?3. Moves all extremities ?4. Cranial nerves II through XII grossly intact. ?Extremities: No clubbing or cyanosis. No edema. ? ?Data Reviewed: I have personally reviewed following labs and imaging studies ? ?CBC: ?Recent Labs  ?Lab 12/07/21 ?0242  ?WBC 6.0  ?NEUTROABS 3.3  ?HGB 8.9*  ?HCT 27.3*  ?MCV 94.1  ?PLT 235  ? ? ? ?Basic Metabolic Panel: ?Recent Labs  ?Lab 12/07/21 ?0242  ?NA 138  ?K 3.9  ?CL 102  ?CO2 30  ?GLUCOSE 94  ?BUN 14  ?CREATININE 0.79  ?CALCIUM 8.8*  ?MG 1.7  ?PHOS 4.5  ? ? ? ?GFR: ?Estimated Creatinine Clearance: 129.9 mL/min (by C-G formula based on SCr of 0.79 mg/dL). ? ?Liver Function Tests: ?Recent Labs  ?Lab 12/07/21 ?0242  ?AST 20  ?ALT 9  ?ALKPHOS 70  ?BILITOT 0.3  ?PROT 6.4*  ?ALBUMIN 2.5*  ? ? ? ?CBG: ?No results for input(s): GLUCAP in the last 168 hours. ? ? ?No results found for this or any previous visit (from the past 240 hour(s)). ?  ? ? ? ? ? ?Radiology Studies: ?No  results found. ? ? ? ? ? ?Scheduled Meds: ? enoxaparin (LOVENOX) injection  40 mg Subcutaneous Q24H  ? methadone  130 mg Oral Daily  ? And  ? methadone  5 mg Oral Daily  ? pantoprazole  40 mg Oral Daily  ? ?Continuous Infusions: ?  ceFAZolin (ANCEF) IV 2 g (12/09/21 0858)  ? [START ON 12/12/2021] oritavancin (ORBACTIV) IVPB    ? ? ? LOS: 25 days  ? ? ?Time spent: over 30 min ? ? ? ?02/11/2022, MD ?[facility] ? ? ?To contact the attending provider between 7A-7P or the covering provider during after hours 7P-7A, please log into the web site [email or web address] and access using universal [place] password for that web site. If you do not have the password, please call the hospital operator. ? ?12/09/2021,  12:00 PM  ? ? ?

## 2021-12-10 DIAGNOSIS — R7881 Bacteremia: Secondary | ICD-10-CM | POA: Diagnosis not present

## 2021-12-10 DIAGNOSIS — B9561 Methicillin susceptible Staphylococcus aureus infection as the cause of diseases classified elsewhere: Secondary | ICD-10-CM | POA: Diagnosis not present

## 2021-12-10 NOTE — Progress Notes (Signed)
?PROGRESS NOTE ? ? ? ?Reginald Clarke  [id number] DOB: Mar 13, 1991 DOA: 11/14/2021 ?PCP: Pcp, No  ?Chief Complaint  ?Patient presents with  ? abdnormal lab  ? ? ?Brief Narrative:  ?Patient is Reginald Clarke 31 years old male with past medical history of IV drug abuse, GERD, chronic pain on methadone, presented to the hospital with positive blood cultures.  Patient was recently diagnosed of having community-acquired pneumonia.  On this presentation, patient had MSSA bacteremia and further work-up showed tricuspid valve endocarditis with severe TR and septic pulmonary emboli.  TEE was performed on 11/23/2021 which showed large vegetation of the tricuspid valve.  ID and CT surgery followed the patient during hospitalization.  Currently on Ancef.  Status post angio VAC on 11/28/2021. Plan for IV antibiotics through 12/12/21 with discharge home and outpatient antibiotics per ID recs as below.  ? ? ?Assessment & Plan: ?  ?Principal Problem: ?  MSSA bacteremia ?Active Problems: ?  Endocarditis of tricuspid valve ?  Septic pulmonary embolism ([place]) ?  CAP (community acquired pneumonia) ?  Chronic pain syndrome ?  GERD (gastroesophageal reflux disease) ?  Hepatitis C, chronic ([place]) ?  Tobacco abuse ?  Polysubstance abuse ([place]) ?  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  Anxiety ? ? ?Assessment and Plan: ?* MSSA bacteremia ?As above ? ?Endocarditis of tricuspid valve ?Now s/p R femoral vein cannulation, R IJ vein cannulation, R heart cannulation, and debridement of R atrial mass and tricuspid valve vegetation on 4/26 with CT surgery -> good results with debridement, residual subvalvular component of vegetation which was not accessible, likely part of torn chordae. ?4/8 with staph aureus in urine ?MSSA 4/9 cultures ?4/12 cultures no growth ?Follow cx results from 4/26, few staph aureus ?ID recommending cefazolin until 5/10, then weekly dalbavancin to complete 6 weeks (he's unwilling to stay in hospital for 6 weeks) ? ? ? ? ?Septic pulmonary embolism  ([place]) ?Treating as above ? ?Chronic pain syndrome ?-chronic back pain, T/L spine without acute infection in spine ?-concern for layering R effusion on MRI, [place] without effusion ?-Reports actively follow as an outpatient for methadone therapy. ?-Continue home methadone dose. ?-will need to ensure he's able to resume this after discharge, will try to call his provider to confirm ? ?GERD (gastroesophageal reflux disease) ?-continue PPI. ? ?Hepatitis C, chronic ([place]) ?Will need outpatient treatment ? ?Anxiety ?Has xanax prn, will start to taper this as we approach discharge - he's been getting rather regularly since 4/23 ? ?Polysubstance abuse ([place]) ?- Cessation counseling provided ?-UDS done during this hospitalization demonstrated positive THC and amphetamines. ? ?Tobacco abuse ?-Smoking cessation counseling has been provided ?-Patient declined nicotine patch. ? ? ?DVT prophylaxis: lovenox ?Code Status: full ?Family Communication: none ?Disposition:  ? ?Status is: Inpatient ?Remains inpatient appropriate because: need for IV abx ?  ?Consultants:  ?CT surgery ?ID ? ?Procedures:  ?4/26 CT surgery ?-Right femoral vein cannulation with Kapri Nero 18 F cannula ?- Right internal jugular vein cannulation with Joeli Fenner 42F Sheath ?- Right heart cannulation ?- Debridement of right atrial mass ?- Debridement of tricuspid valve vegetation ? ?Antimicrobials:  ?Anti-infectives (From admission, onward)  ? ? Start     Dose/Rate Route Frequency Ordered Stop  ? 12/12/21 0800  Oritavancin Diphosphate (ORBACTIV) 1,200 mg in dextrose 5 % IVPB       ? 1,200 mg ?333.3 mL/hr over 180 Minutes Intravenous Once 12/04/21 1003    ? 11/24/21 0000  ceFAZolin (ANCEF) IVPB 2g/100 mL premix       ? 2  g ?200 mL/hr over 30 Minutes Intravenous Every 8 hours 11/23/21 1950 12/12/21 0800  ? 11/14/21 1400  ceFAZolin (ANCEF) injection 2 g  Status:  Discontinued       ? 2 g Intramuscular Every 8 hours 11/14/21 1050 11/14/21 1105  ? 11/14/21 1115  ceFAZolin (ANCEF) IVPB 2g/100  mL premix  Status:  Discontinued       ? 2 g ?200 mL/hr over 30 Minutes Intravenous Every 8 hours 11/14/21 1105 11/23/21 1950  ? 11/14/21 1100  vancomycin (VANCOREADY) IVPB 1500 mg/300 mL  Status:  Discontinued       ? 1,500 mg ?150 mL/hr over 120 Minutes Intravenous Every 12 hours 11/14/21 1047 11/14/21 1102  ? ?  ? ? ?Subjective: ?No complaints ? ?Objective: ?Vitals:  ? 12/09/21 1726 12/09/21 1920 12/10/21 0438 12/10/21 4696  ?BP: 106/63 110/63 102/66 105/67  ?Pulse: 82 94 83 87  ?Resp: 18  18 18   ?Temp: 98.4 ?F (36.9 ?C) 98.2 ?F (36.8 ?C) 98.8 ?F (37.1 ?C) 97.8 ?F (36.6 ?C)  ?TempSrc: Oral Oral Oral Oral  ?SpO2: 98% 99% 97% 96%  ?Weight:      ?Height:      ? ? ?Intake/Output Summary (Last 24 hours) at 12/10/2021 1527 ?Last data filed at 12/10/2021 1500 ?Gross per 24 hour  ?Intake 817.69 ml  ?Output --  ?Net 817.69 ml  ? ?Filed Weights  ? 11/14/21 1023 11/14/21 2000 11/28/21 1100  ?Weight: 70.8 kg 70.2 kg 68 kg  ? ? ?Examination: ? ?General: No acute distress. ?Cardiovascular: RRR ?Lungs: unlabored ?Neurological: Alert and oriented ?3. Moves all extremities ?4 Cranial nerves II through XII grossly intact. ?Extremities: No clubbing or cyanosis. No edema.  ? ?Data Reviewed: I have personally reviewed following labs and imaging studies ? ?CBC: ?Recent Labs  ?Lab 12/07/21 ?0242  ?WBC 6.0  ?NEUTROABS 3.3  ?HGB 8.9*  ?HCT 27.3*  ?MCV 94.1  ?PLT 235  ? ? ? ?Basic Metabolic Panel: ?Recent Labs  ?Lab 12/07/21 ?0242  ?NA 138  ?K 3.9  ?CL 102  ?CO2 30  ?GLUCOSE 94  ?BUN 14  ?CREATININE 0.79  ?CALCIUM 8.8*  ?MG 1.7  ?PHOS 4.5  ? ? ? ?GFR: ?Estimated Creatinine Clearance: 129.9 mL/min (by C-G formula based on SCr of 0.79 mg/dL). ? ?Liver Function Tests: ?Recent Labs  ?Lab 12/07/21 ?0242  ?AST 20  ?ALT 9  ?ALKPHOS 70  ?BILITOT 0.3  ?PROT 6.4*  ?ALBUMIN 2.5*  ? ? ? ?CBG: ?No results for input(s): GLUCAP in the last 168 hours. ? ? ?No results found for this or any previous visit (from the past 240 hour(s)). ?  ? ? ? ? ? ?Radiology  Studies: ?No results found. ? ? ? ? ? ?Scheduled Meds: ? enoxaparin (LOVENOX) injection  40 mg Subcutaneous Q24H  ? methadone  130 mg Oral Daily  ? And  ? methadone  5 mg Oral Daily  ? pantoprazole  40 mg Oral Daily  ? ?Continuous Infusions: ?  ceFAZolin (ANCEF) IV Stopped (12/10/21 1123)  ? [START ON 12/12/2021] oritavancin (ORBACTIV) IVPB    ? ? ? LOS: 26 days  ? ? ?Time spent: over 30 min ? ? ? ?02/11/2022, MD ?[facility] ? ? ?To contact the attending provider between 7A-7P or the covering provider during after hours 7P-7A, please log into the web site [email or web address] and access using universal  password for that web site. If you do not have the password, please call the hospital operator. ? ?12/10/2021,  3:27 PM  ? ? ?

## 2021-12-10 NOTE — TOC Progression Note (Signed)
Transition of Care (TOC) - Progression Note  ?Donn Pierini Charity fundraiser, BSN ?Transitions of Care ?Unit 4E- RN Case Manager ?See Treatment Team for direct phone #  ? ? ?Patient Details  ?Name: Reginald Clarke ?MRN: [id number] ?Date of Birth: 15-Jun-1991 ? ?Transition of Care (TOC) CM/SW Contact  ?Darrold Span, RN ?Phone Number: ?12/10/2021, 1:33 PM ? ?Clinical Narrative:    ?Acknowledged referral for concerns about hospital bill. CM spoke with pt at bedside to answer questions about hospital bill, my Chart, payment plans, etc. All questions answered that pt had regarding how billing was done, pt voiced no further concerns at this time.  ?CM also asked pt if he had made any calls about primary care yet, pt voiced he has been thinking about called the provider that he grandmother goes to- encouraged pt to make calls about primary care provider sooner rather than later to get a new patient appointment.   ? ?TOC continues to follow for transition of care needs -  ? ? ?  ?Barriers to Discharge: Other (must enter comment) (No beds at [facility]) ? ?Expected Discharge Plan and Services ?  ?  ?  ?  ?  ?                ?  ?  ?  ?  ?  ?  ?  ?  ?  ?  ? ? ?Social Determinants of Health (SDOH) Interventions ?  ? ?Readmission Risk Interventions ?   ? View : No data to display.  ?  ?  ?  ? ? ?

## 2021-12-11 DIAGNOSIS — B9561 Methicillin susceptible Staphylococcus aureus infection as the cause of diseases classified elsewhere: Secondary | ICD-10-CM | POA: Diagnosis not present

## 2021-12-11 DIAGNOSIS — R7881 Bacteremia: Secondary | ICD-10-CM | POA: Diagnosis not present

## 2021-12-11 MED ORDER — ALPRAZOLAM 0.25 MG PO TABS
0.2500 mg | ORAL_TABLET | Freq: Two times a day (BID) | ORAL | Status: DC | PRN
  Administered 2021-12-11 – 2021-12-12 (×2): 0.25 mg via ORAL
  Filled 2021-12-11 (×2): qty 1

## 2021-12-11 NOTE — Progress Notes (Addendum)
?PROGRESS NOTE ? ? ? ?Reginald Clarke  NFA:[id number] DOB: Dec 18, 1990 DOA: 11/14/2021 ?PCP: Pcp, No  ?Chief Complaint  ?Patient presents with  ? abdnormal lab  ? ? ?Brief Narrative:  ?Patient is Reginald Clarke 31 years old male with past medical history of IV drug abuse, GERD, chronic pain on methadone, presented to the hospital with positive blood cultures.  Patient was recently diagnosed of having community-acquired pneumonia.  On this presentation, patient had MSSA bacteremia and further work-up showed tricuspid valve endocarditis with severe TR and septic pulmonary emboli.  TEE was performed on 11/23/2021 which showed large vegetation of the tricuspid valve.  ID and CT surgery followed the patient during hospitalization.  Currently on Ancef.  Status post angio VAC on 11/28/2021. Plan for IV antibiotics through 12/12/21 with discharge home and outpatient antibiotics per ID recs as below.  ? ? ?Assessment & Plan: ?  ?Principal Problem: ?  MSSA bacteremia ?Active Problems: ?  Endocarditis of tricuspid valve ?  Septic pulmonary embolism ([name]) ?  CAP (community acquired pneumonia) ?  Chronic pain syndrome ?  GERD (gastroesophageal reflux disease) ?  Hepatitis C, chronic ([name]) ?  Tobacco abuse ?  Polysubstance abuse ([name]) ?  Methadone dependence ([name]) ?  Anxiety ? ? ?Assessment and Plan: ?* MSSA bacteremia ?As above ? ?Endocarditis of tricuspid valve ?Now s/p R femoral vein cannulation, R IJ vein cannulation, R heart cannulation, and debridement of R atrial mass and tricuspid valve vegetation on 4/26 with CT surgery -> good results with debridement, residual subvalvular component of vegetation which was not accessible, likely part of torn chordae. ?4/8 with staph aureus in urine ?MSSA 4/9 cultures ?4/12 cultures no growth ?Follow cx results from 4/26, few staph aureus ?ID recommending cefazolin until 5/10, then weekly dalbavancin to complete 6 weeks (he's unwilling to stay in hospital for 6 weeks) ? ? ? ? ?Septic pulmonary embolism  ([name]) ?Treating as above ? ?Chronic pain syndrome ?-chronic back pain, T/L spine without acute infection in spine ?-concern for layering R effusion on MRI, [place] without effusion ?-Reports actively follow as an outpatient for methadone therapy. ?-Continue home methadone dose. ?-I called [facility] in [place] yesterday, left message, they called back today.  They know he'll likely discharge 5/10.  He should arrive at 6 am Thursday for his prescription. ? ?GERD (gastroesophageal reflux disease) ?-continue PPI. ? ?Hepatitis C, chronic ([name]) ?Will need outpatient treatment ?Follow up with ID ? ?Anxiety ?Has xanax prn, will start to taper this as we approach discharge - he's been getting rather regularly since 4/23 may need Ad Guttman short taper at discharge? ? ?Methadone dependence ([name]) ?-I called [facility] in [place] yesterday, left message, they called back today.  They know he'll likely discharge 5/10.  He should arrive at 6 am Thursday for his prescription.  Discussed his current methaodone dose with them. ? ?Polysubstance abuse ([name]) ?- Cessation counseling provided ?-UDS done during this hospitalization demonstrated positive THC and amphetamines. ? ?Tobacco abuse ?-Smoking cessation counseling has been provided ?-Patient declined nicotine patch. ? ? ?DVT prophylaxis: lovenox ?Code Status: full ?Family Communication: none ?Disposition:  ? ?Status is: Inpatient ?Remains inpatient appropriate because: need for IV abx ?  ?Consultants:  ?CT surgery ?ID ? ?Procedures:  ?4/26 CT surgery ?-Right femoral vein cannulation with Jeda Pardue 18 F cannula ?- Right internal jugular vein cannulation with Paislee Szatkowski 65F Sheath ?- Right heart cannulation ?- Debridement of right atrial mass ?- Debridement of tricuspid valve vegetation ? ?Antimicrobials:  ?Anti-infectives (From admission, onward)  ? ?  Start     Dose/Rate Route Frequency Ordered Stop  ? 12/12/21 0800  Oritavancin Diphosphate (ORBACTIV) 1,200 mg in dextrose 5 % IVPB       ? 1,200 mg ?333.3  mL/hr over 180 Minutes Intravenous Once 12/04/21 1003    ? 11/24/21 0000  ceFAZolin (ANCEF) IVPB 2g/100 mL premix       ? 2 g ?200 mL/hr over 30 Minutes Intravenous Every 8 hours 11/23/21 1950 12/12/21 0800  ? 11/14/21 1400  ceFAZolin (ANCEF) injection 2 g  Status:  Discontinued       ? 2 g Intramuscular Every 8 hours 11/14/21 1050 11/14/21 1105  ? 11/14/21 1115  ceFAZolin (ANCEF) IVPB 2g/100 mL premix  Status:  Discontinued       ? 2 g ?200 mL/hr over 30 Minutes Intravenous Every 8 hours 11/14/21 1105 11/23/21 1950  ? 11/14/21 1100  vancomycin (VANCOREADY) IVPB 1500 mg/300 mL  Status:  Discontinued       ? 1,500 mg ?150 mL/hr over 120 Minutes Intravenous Every 12 hours 11/14/21 1047 11/14/21 1102  ? ?  ? ? ?Subjective: ?No complaints ? ?Objective: ?Vitals:  ? 12/10/21 1935 12/10/21 2335 12/11/21 0343 12/11/21 0742  ?BP: 121/78 97/61 110/75 (!) 95/54  ?Pulse: 79 86 85 94  ?Resp: 18 17 18 20   ?Temp: 98.3 ?F (36.8 ?C) 97.9 ?F (36.6 ?C) 98.1 ?F (36.7 ?C) 97.8 ?F (36.6 ?C)  ?TempSrc: Oral Oral Oral Oral  ?SpO2: 97% 96% 98% 96%  ?Weight:      ?Height:      ? ?No intake or output data in the 24 hours ending 12/11/21 1510 ? ?Filed Weights  ? 11/14/21 1023 11/14/21 2000 11/28/21 1100  ?Weight: 70.8 kg 70.2 kg 68 kg  ? ? ?Examination: ? ?General: No acute distress. ?Lung: unlabored ?Neurological: Alert and oriented ?3. Moves all extremities ?4. Cranial nerves II through XII grossly intact. ?Extremities: No clubbing or cyanosis. No edema.  ? ?Data Reviewed: I have personally reviewed following labs and imaging studies ? ?CBC: ?Recent Labs  ?Lab 12/07/21 ?0242  ?WBC 6.0  ?NEUTROABS 3.3  ?HGB 8.9*  ?HCT 27.3*  ?MCV 94.1  ?PLT 235  ? ? ? ?Basic Metabolic Panel: ?Recent Labs  ?Lab 12/07/21 ?0242  ?NA 138  ?K 3.9  ?CL 102  ?CO2 30  ?GLUCOSE 94  ?BUN 14  ?CREATININE 0.79  ?CALCIUM 8.8*  ?MG 1.7  ?PHOS 4.5  ? ? ? ?GFR: ?Estimated Creatinine Clearance: 129.9 mL/min (by C-G formula based on SCr of 0.79 mg/dL). ? ?Liver Function  Tests: ?Recent Labs  ?Lab 12/07/21 ?0242  ?AST 20  ?ALT 9  ?ALKPHOS 70  ?BILITOT 0.3  ?PROT 6.4*  ?ALBUMIN 2.5*  ? ? ? ?CBG: ?No results for input(s): GLUCAP in the last 168 hours. ? ? ?No results found for this or any previous visit (from the past 240 hour(s)). ?  ? ? ? ? ? ?Radiology Studies: ?No results found. ? ? ? ? ? ?Scheduled Meds: ? enoxaparin (LOVENOX) injection  40 mg Subcutaneous Q24H  ? methadone  130 mg Oral Daily  ? And  ? methadone  5 mg Oral Daily  ? pantoprazole  40 mg Oral Daily  ? ?Continuous Infusions: ?  ceFAZolin (ANCEF) IV 2 g (12/11/21 0757)  ? [START ON 12/12/2021] oritavancin (ORBACTIV) IVPB    ? ? ? LOS: 27 days  ? ? ?Time spent: over 30 min ? ? ? ?02/11/2022, MD ?[facility] ? ? ?To contact  the attending provider between 7A-7P or the covering provider during after hours 7P-7A, please log into the web site [email or web address] and access using universal [place] password for that web site. If you do not have the password, please call the hospital operator. ? ?12/11/2021, 3:10 PM  ? ? ?

## 2021-12-11 NOTE — Assessment & Plan Note (Addendum)
Follow-up with his outpatient provider, [facility] in [facility].  He gets prescriptions from there. ?

## 2021-12-12 DIAGNOSIS — R7881 Bacteremia: Secondary | ICD-10-CM | POA: Diagnosis not present

## 2021-12-12 DIAGNOSIS — B9561 Methicillin susceptible Staphylococcus aureus infection as the cause of diseases classified elsewhere: Secondary | ICD-10-CM | POA: Diagnosis not present

## 2021-12-12 MED ORDER — PANTOPRAZOLE SODIUM 40 MG PO TBEC: 40.0000 mg | DELAYED_RELEASE_TABLET | Freq: Every day | ORAL | 0 refills | Status: DC

## 2021-12-12 MED ORDER — ALPRAZOLAM 0.25 MG PO TABS: 0.2500 mg | ORAL_TABLET | Freq: Two times a day (BID) | ORAL | 0 refills | Status: DC | PRN

## 2021-12-12 MED ORDER — METHOCARBAMOL 500 MG PO TABS: 500.0000 mg | ORAL_TABLET | Freq: Three times a day (TID) | ORAL | 0 refills | Status: DC | PRN

## 2021-12-12 NOTE — TOC Transition Note (Signed)
Transition of Care (TOC) - CM/SW Discharge Note ?Donn Pierini Charity fundraiser, BSN ?Transitions of Care ?Unit 4E- RN Case Manager ?See Treatment Team for direct phone #  ? ? ?Patient Details  ?Name: Reginald Clarke ?MRN: [id number] ?Date of Birth: Nov 13, 1990 ? ?Transition of Care (TOC) CM/SW Contact:  ?Zenda Alpers, Lenn Sink, RN ?Phone Number: ?12/12/2021, 4:29 PM ? ? ?Clinical Narrative:    ?Pt from home, hx IVDU. Plan to finish out abx outpt- ID has arranged follow up. Per MD note pt needs to go to [facility] for ongoing [facility] prescription/dosing- pt will need to arrive tomorrow at 6am.  ? ?Pt working on finding a PCP- per pt he wants to see if the primary care his grandmother goes to is taking new patients.  ? ?No further TOC needs noted.  ? ? ?Final next level of care: Home/Self Care ?Barriers to Discharge: Barriers Resolved ? ? ?Patient Goals and CMS Choice ?Patient states their goals for this hospitalization and ongoing recovery are:: return home ?  ?Choice offered to / list presented to : NA ? ?Discharge Placement ?  ?           ?  ? Home ?  ?  ? ?Discharge Plan and Services ?In-house Referral: PCP / [facility] ?Discharge Planning Services: CM Consult ?Post Acute Care Choice: NA          ?DME Arranged: N/A ?DME Agency: NA ?  ?  ?  ?HH Arranged: NA ?HH Agency: NA ?  ?  ?  ? ?Social Determinants of Health (SDOH) Interventions ?  ? ? ?Readmission Risk Interventions ?   ? View : No data to display.  ?  ?  ?  ? ? ? ? ? ?

## 2021-12-12 NOTE — Discharge Summary (Signed)
?[facility] ? ?Physician Discharge Summary  ? ?Patient ID: ?Reginald Clarke ?MRN: [id number] ?DOB/AGE: Jan 18, 1991 31 y.o. ? ?Admit date: 11/14/2021 ?Discharge date: 12/12/2021   ? ?PCP: Pcp, No ? ?DISCHARGE DIAGNOSES:  ?Principal Problem: ?  MSSA bacteremia ?Active Problems: ?  Endocarditis of tricuspid valve ?  Septic pulmonary embolism ([place]) ?  CAP (community acquired pneumonia) ?  Chronic pain syndrome ?  GERD (gastroesophageal reflux disease) ?  Hepatitis C, chronic ([place]) ?  Tobacco abuse ?  Polysubstance abuse ([place]) ?  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  Anxiety ? ? ?RECOMMENDATIONS FOR OUTPATIENT FOLLOW UP: ?ID to arrange outpatient follow-up ? ? ?Home Health: None ?Equipment/Devices: None ? ?CODE STATUS: Full code ? ?DISCHARGE CONDITION: fair ? ?Diet recommendation: As before ? ?INITIAL HISTORY: ?Patient is a 31 years old male with past medical history of IV drug abuse, GERD, chronic pain on methadone, presented to the hospital with positive blood cultures.  Patient was recently diagnosed of having community-acquired pneumonia.  On this presentation, patient had MSSA bacteremia and further work-up showed tricuspid valve endocarditis with severe TR and septic pulmonary emboli.  TEE was performed on 11/23/2021 which showed large vegetation of the tricuspid valve.  ID and CT surgery followed the patient during hospitalization.  Currently on Ancef.  Status post angio VAC on 11/28/2021. Plan for IV antibiotics through 12/12/21 with discharge home and outpatient antibiotics per ID recs. ? ?Consultants:  ?CT surgery ?ID ?  ?Procedures:  ?4/26 CT surgery ?-Right femoral vein cannulation with a 18 F cannula ?- Right internal jugular vein cannulation with a 7F Sheath ?- Right heart cannulation ?- Debridement of right atrial mass ?- Debridement of tricuspid valve vegetation ? ? ?HOSPITAL COURSE:  ?* MSSA bacteremia ?As above ? ?Endocarditis of tricuspid valve ?Now s/p R femoral vein cannulation, R IJ vein cannulation, R heart  cannulation, and debridement of R atrial mass and tricuspid valve vegetation on 4/26 with CT surgery -> good results with debridement, residual subvalvular component of vegetation which was not accessible, likely part of torn chordae. ?4/8 with staph aureus in urine ?MSSA 4/9 cultures ?4/12 cultures no growth ?ID recommended cefazolin until 5/10.  Subsequently patient was given oritavancin.  Plan is for weekly dalbavancin to be arranged by infectious disease.  Discussed with Dr. West Bali before discharge. ? ? ? ? ?Septic pulmonary embolism ([place]) ?Treating as above. ? ?Chronic pain syndrome ?-chronic back pain, T/L spine without acute infection in spine ?-concern for layering R effusion on MRI, [place] without effusion ?-Reports actively follow as an outpatient for methadone therapy. ?-Continue home methadone dose. ?-Previous rounding MD was in contact with [facility] in [place]. They know he'll likely discharge 5/10.  He should arrive at 6 am Thursday for his prescription. ? ?GERD (gastroesophageal reflux disease) ?Continue PPI. ? ?Hepatitis C, chronic ([place]) ?Will need outpatient treatment ?Follow up with ID ? ?Anxiety ?Few tablets of Xanax prescribed at the time of discharge. ? 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  He gets prescriptions from there. ? ?Polysubstance abuse ([place]) ?- Cessation counseling provided ?-UDS done during this hospitalization demonstrated positive THC and amphetamines. ? ?Tobacco abuse ?-Smoking cessation counseling has been provided ?-Patient declined nicotine patch. ? ? ? ?Patient is stable.  Okay for discharge home today. ? ? ?PERTINENT LABS: ? ?The results of significant diagnostics from this hospitalization (including imaging, microbiology, ancillary and laboratory) are listed below for reference.   ? ? ?Labs: ? ?COVID-19 Labs ? ? ?Lab Results  ?  Component Value Date  ? [place] NEGATIVE 11/10/2021  ? ? ? ? ?Basic  Metabolic Panel: ?Recent Labs  ?Lab 12/07/21 ?0242  ?NA 138  ?K 3.9  ?CL 102  ?CO2 30  ?GLUCOSE 94  ?BUN 14  ?CREATININE 0.79  ?CALCIUM 8.8*  ?MG 1.7  ?PHOS 4.5  ? ?Liver Function Tests: ?Recent Labs  ?Lab 12/07/21 ?0242  ?AST 20  ?ALT 9  ?ALKPHOS 70  ?BILITOT 0.3  ?PROT 6.4*  ?ALBUMIN 2.5*  ? ? ?CBC: ?Recent Labs  ?Lab 12/07/21 ?0242  ?WBC 6.0  ?NEUTROABS 3.3  ?HGB 8.9*  ?HCT 27.3*  ?MCV 94.1  ?PLT 235  ? ? ? ? ?IMAGING STUDIES ?DG Orthopantogram ? ?Result Date: 11/19/2021 ?CLINICAL DATA:  Tooth pain EXAM: ORTHOPANTOGRAM/PANORAMIC COMPARISON:  None. FINDINGS: Panorex view of the mandible was performed. Extremely poor dentition is noted. There are numerous missing teeth bilaterally. Marked erosive changes are seen within the residual upper and lower premolars and molars. Erosive changes are seen within the upper incisors. No acute bony abnormalities. Visualized sinuses are clear. IMPRESSION: 1. Extremely poor dentition, with numerous missing teeth bilaterally and marked erosive changes of the residual incisors, premolars, and molars. 2. No acute bony abnormality. Electronically Signed   By: Randa Ngo M.D.   On: 11/19/2021 22:16  ? ?DG Chest 2 View ? ?Result Date: 11/14/2021 ?CLINICAL DATA:  PNA monitoring EXAM: CHEST - 2 VIEW COMPARISON:  November 11, 2021. FINDINGS: New right basilar airspace opacities. New small right pleural effusion. Left lung is clear. No visible pneumothorax. Cardiomediastinal silhouette is within normal limits. IMPRESSION: Findings compatible with right basilar pneumonia and small right parapneumonic effusion. Electronically Signed   By: Margaretha Sheffield M.D.   On: 11/14/2021 11:15  ? ?MR THORACIC SPINE W WO CONTRAST ? ?Result Date: 11/15/2021 ?CLINICAL DATA:  Initial evaluation for chronic mid back pain. EXAM: MRI THORACIC WITHOUT AND WITH CONTRAST TECHNIQUE: Multiplanar and multiecho pulse sequences of the thoracic spine were obtained without and with intravenous contrast. CONTRAST:  81mL  GADAVIST GADOBUTROL 1 MMOL/ML IV SOLN COMPARISON:  None. FINDINGS: Alignment: Physiologic with preservation of the normal thoracic kyphosis. No listhesis. Vertebrae: Vertebral body height maintained without acute or chronic fracture. Bone marrow signal intensity diffusely decreased on T1 weighted sequence, nonspecific, but most commonly related to anemia, smoking or obesity. No discrete or worrisome osseous lesions. No evidence for osteomyelitis discitis or septic arthritis. Cord: Normal signal and morphology. No epidural abscess or other collection. Paraspinal and other soft tissues: No paraspinous phlegmon or collection. Scattered patchy multifocal opacities within the partially visualized lungs, suspicious for possible septic emboli, worse on the right. Layering right pleural effusion. Disc levels: T4-5: Small central disc protrusion indents the ventral thecal sac. Minimal flattening of the ventral cord without cord signal changes or significant stenosis. T5-6: Small central disc protrusion indents the ventral thecal sac. Secondary cord flattening without cord signal changes or significant stenosis. T6-7: Small central disc protrusion indents the ventral thecal sac. Minimal cord flattening without cord signal changes or significant spinal stenosis. Otherwise, no other significant disc pathology seen elsewhere within the thoracic spine. Few scattered chronic endplate Schmorl's nodes noted throughout the mid and lower thoracic spine. No other stenosis or neural impingement. IMPRESSION: 1. No evidence for acute infection within the thoracic spine. 2. Scattered patchy multifocal opacities within the partially visualized lungs, suspicious for septic emboli, worse on the right. Associated layering right pleural effusion. 3. Small central disc protrusions at T4-5 through T6-7 without significant stenosis. Electronically  Signed   By: Jeannine Boga M.D.   On: 11/15/2021 19:16  ? ?MR Lumbar Spine W Wo  Contrast ? ?Result Date: 11/15/2021 ?CLINICAL DATA:  Initial evaluation for chronic low back pain. EXAM: MRI LUMBAR SPINE WITHOUT AND WITH CONTRAST TECHNIQUE: Multiplanar and multiecho pulse sequences of the lumbar sp

## 2021-12-12 NOTE — Progress Notes (Signed)
Pt discharged from unit. Medication/discharge instruction given  Sham Alviar K Taniyah Ballow, RN  

## 2021-12-22 ENCOUNTER — Emergency Department (HOSPITAL_COMMUNITY)
Admission: EM | Admit: 2021-12-22 | Discharge: 2021-12-22 | Disposition: A | Discharge status: Home / Self Care | Payer: BLUE CROSS/BLUE SHIELD | Attending: Emergency Medicine | Admitting: Emergency Medicine

## 2021-12-22 ENCOUNTER — Emergency Department (HOSPITAL_COMMUNITY): Payer: BLUE CROSS/BLUE SHIELD

## 2021-12-22 DIAGNOSIS — Z5321 Procedure and treatment not carried out due to patient leaving prior to being seen by health care provider: Secondary | ICD-10-CM | POA: Diagnosis not present

## 2021-12-22 DIAGNOSIS — R509 Fever, unspecified: Secondary | ICD-10-CM | POA: Diagnosis present

## 2021-12-22 LAB — CBC WITH DIFFERENTIAL/PLATELET
Abs Immature Granulocytes: 0.02 10*3/uL (ref 0.00–0.07)
Basophils Absolute: 0 10*3/uL (ref 0.0–0.1)
Basophils Relative: 0 %
Eosinophils Absolute: 0 10*3/uL (ref 0.0–0.5)
Eosinophils Relative: 0 %
HCT: 34.9 % — ABNORMAL LOW (ref 39.0–52.0)
Hemoglobin: 11 g/dL — ABNORMAL LOW (ref 13.0–17.0)
Immature Granulocytes: 0 %
Lymphocytes Relative: 11 %
Lymphs Abs: 1 10*3/uL (ref 0.7–4.0)
MCH: 29.9 pg (ref 26.0–34.0)
MCHC: 31.5 g/dL (ref 30.0–36.0)
MCV: 94.8 fL (ref 80.0–100.0)
Monocytes Absolute: 0.6 10*3/uL (ref 0.1–1.0)
Monocytes Relative: 7 %
Neutro Abs: 7.7 10*3/uL (ref 1.7–7.7)
Neutrophils Relative %: 82 %
Platelets: 129 10*3/uL — ABNORMAL LOW (ref 150–400)
RBC: 3.68 MIL/uL — ABNORMAL LOW (ref 4.22–5.81)
RDW: 14.2 % (ref 11.5–15.5)
WBC: 9.4 10*3/uL (ref 4.0–10.5)
nRBC: 0 % (ref 0.0–0.2)

## 2021-12-22 LAB — COMPREHENSIVE METABOLIC PANEL
ALT: 29 U/L (ref 0–44)
AST: 28 U/L (ref 15–41)
Albumin: 3.2 g/dL — ABNORMAL LOW (ref 3.5–5.0)
Alkaline Phosphatase: 62 U/L (ref 38–126)
Anion gap: 10 (ref 5–15)
BUN: 11 mg/dL (ref 6–20)
CO2: 25 mmol/L (ref 22–32)
Calcium: 9 mg/dL (ref 8.9–10.3)
Chloride: 101 mmol/L (ref 98–111)
Creatinine, Ser: 0.97 mg/dL (ref 0.61–1.24)
GFR, Estimated: >60 mL/min (ref >60)
Glucose, Bld: 117 mg/dL — ABNORMAL HIGH (ref 70–99)
Potassium: 3.8 mmol/L (ref 3.5–5.1)
Sodium: 136 mmol/L (ref 135–145)
Total Bilirubin: 0.8 mg/dL (ref 0.3–1.2)
Total Protein: 7 g/dL (ref 6.5–8.1)

## 2021-12-22 IMAGING — CR DG CHEST 2V
2 series · 2 of 2 positions shown · non-contrast
Comparison: None Available.

CLINICAL DATA: Fever

EXAM:
CHEST - 2 VIEW

[chest pa]
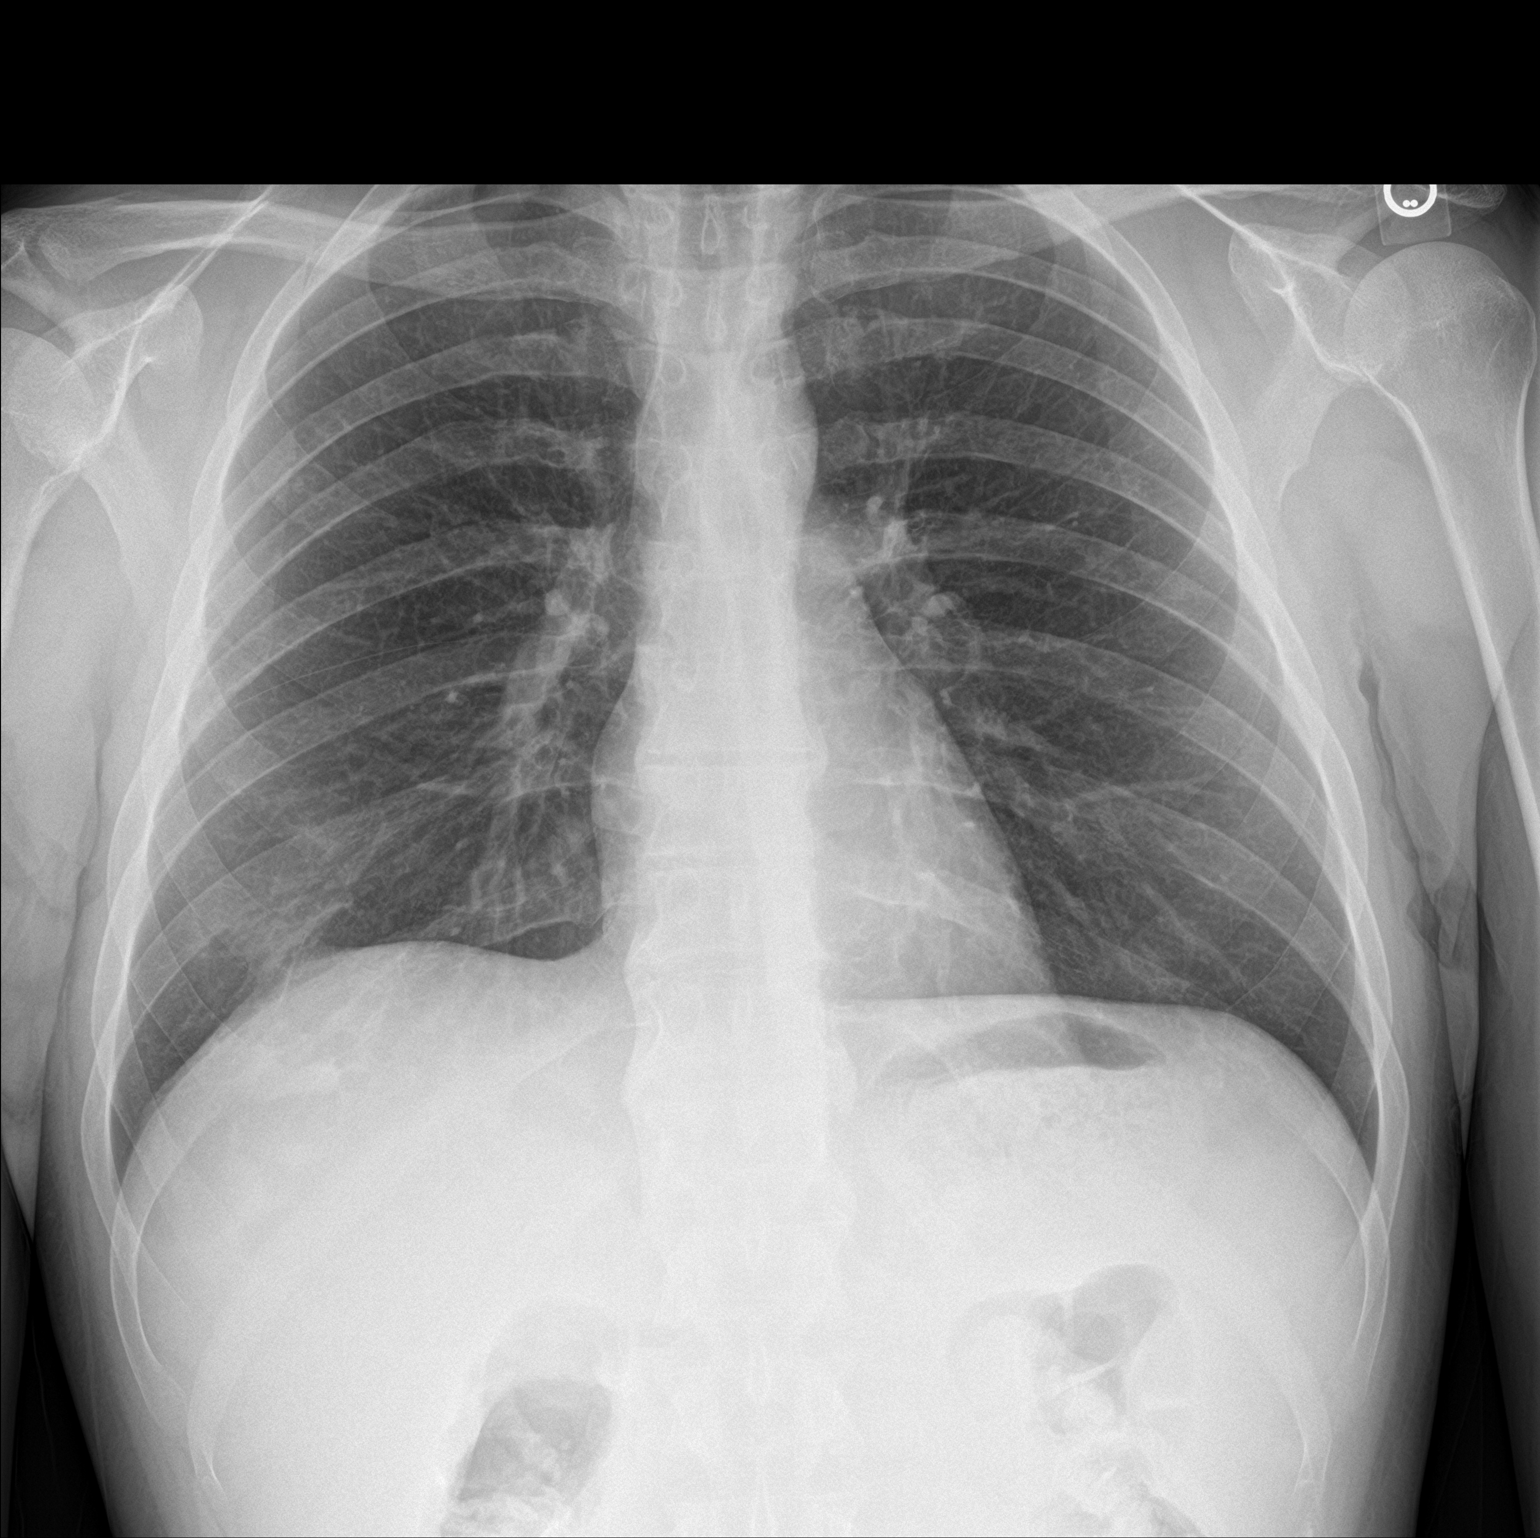

[chest lat]
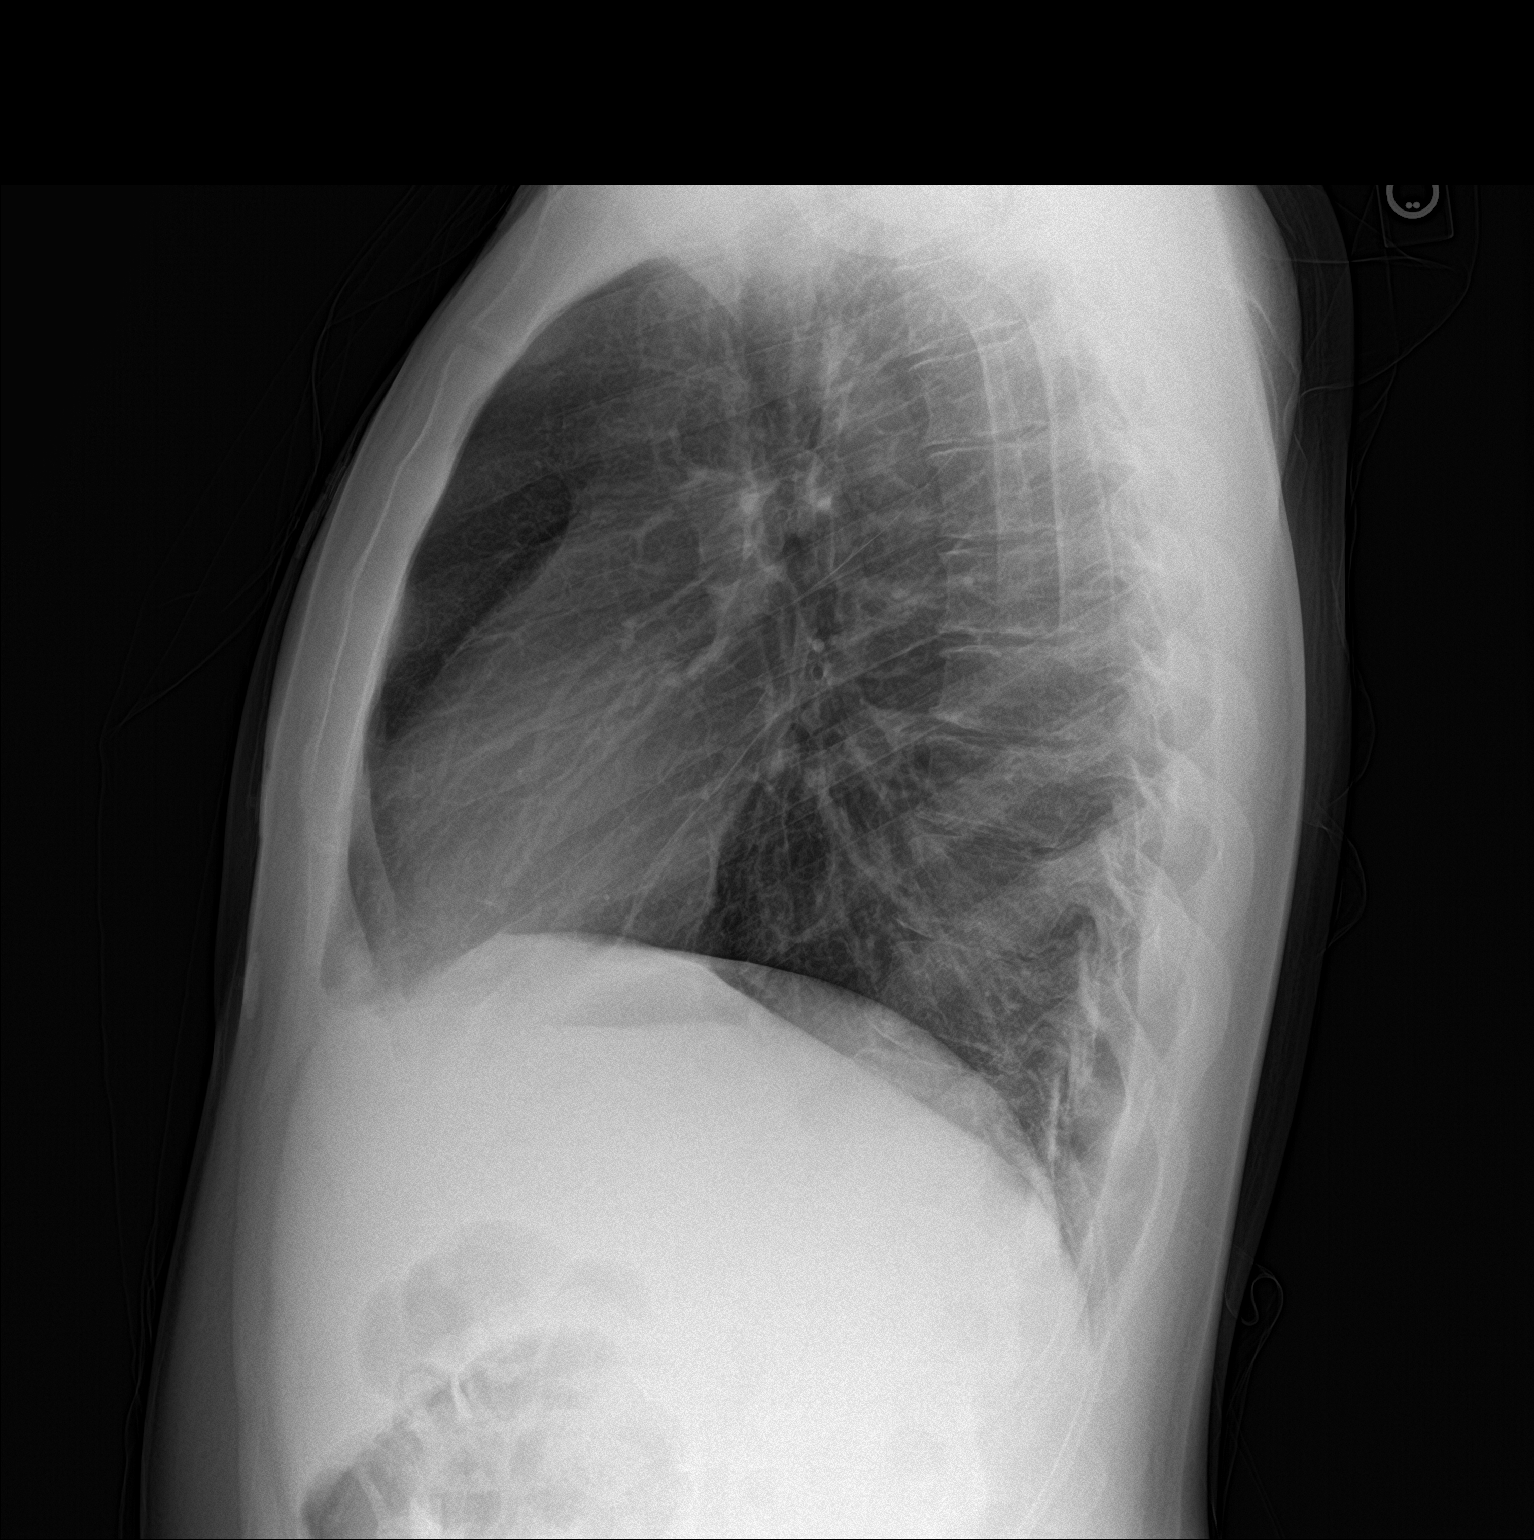

[2 of 2 positions shown; findings below may reference images not displayed]

FINDINGS: Stable right basilar scarring. Lungs are otherwise clear. No
pneumothorax or pleural effusion. Cardiac size within normal limits.
Pulmonary vascularity is normal. No acute bone abnormality.
IMPRESSION: No active cardiopulmonary disease.

## 2021-12-22 NOTE — ED Triage Notes (Signed)
Pt c/o fever, seen "a couple weeks ago" for angiovac, TEE, tentative schedule for valve replacement. Has an appt Monday w infectious disease for abx injection, states he's had multiple fevers over the last couple days. Denies N/V, SHOB Last tylenol 1pm

## 2021-12-22 NOTE — ED Notes (Signed)
Pt stated he felt better and wanted to wait until his appointment on Monday. Pt told he had a room, he still decided to leave AMA

## 2021-12-24 ENCOUNTER — Encounter: Payer: Self-pay | Admitting: Infectious Diseases

## 2021-12-24 ENCOUNTER — Ambulatory Visit (INDEPENDENT_AMBULATORY_CARE_PROVIDER_SITE_OTHER): Payer: BLUE CROSS/BLUE SHIELD | Admitting: Infectious Diseases

## 2021-12-24 ENCOUNTER — Other Ambulatory Visit: Payer: Self-pay

## 2021-12-24 ENCOUNTER — Ambulatory Visit (INDEPENDENT_AMBULATORY_CARE_PROVIDER_SITE_OTHER): Payer: BLUE CROSS/BLUE SHIELD

## 2021-12-24 VITALS — BP 116/74 | HR 101 | Temp 101.5°F | Resp 18 | Ht 70.0 in | Wt 165.0 lb

## 2021-12-24 VITALS — BP 125/70 | HR 116 | Temp 99.9°F | Wt 166.0 lb

## 2021-12-24 DIAGNOSIS — F17200 Nicotine dependence, unspecified, uncomplicated: Secondary | ICD-10-CM | POA: Diagnosis not present

## 2021-12-24 DIAGNOSIS — B182 Chronic viral hepatitis C: Secondary | ICD-10-CM | POA: Diagnosis not present

## 2021-12-24 DIAGNOSIS — B9561 Methicillin susceptible Staphylococcus aureus infection as the cause of diseases classified elsewhere: Secondary | ICD-10-CM

## 2021-12-24 DIAGNOSIS — I079 Rheumatic tricuspid valve disease, unspecified: Secondary | ICD-10-CM

## 2021-12-24 DIAGNOSIS — R7881 Bacteremia: Secondary | ICD-10-CM

## 2021-12-24 DIAGNOSIS — R509 Fever, unspecified: Secondary | ICD-10-CM

## 2021-12-24 DIAGNOSIS — F1910 Other psychoactive substance abuse, uncomplicated: Secondary | ICD-10-CM

## 2021-12-24 MED ORDER — DEXTROSE 5 % IV SOLN
500.0000 mg | Freq: Once | INTRAVENOUS | Status: AC
  Administered 2021-12-24: 500 mg via INTRAVENOUS
  Filled 2021-12-24: qty 25

## 2021-12-24 NOTE — Progress Notes (Signed)
Patient Active Problem List   Diagnosis Date Noted   Anxiety 12/05/2021   Septic pulmonary embolism ([name]) 11/25/2021   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   Hepatitis C, chronic ([name]) 11/24/2021   Polysubstance abuse ([name]) 11/17/2021   Tobacco abuse 11/15/2021   Endocarditis of tricuspid valve 11/15/2021   MSSA bacteremia 11/14/2021   Chronic pain syndrome 11/14/2021   CAP (community acquired pneumonia) 11/14/2021   GERD (gastroesophageal reflux disease) 11/14/2021    Patient's Medications  New Prescriptions   No medications on file  Previous Medications   ALPRAZOLAM (XANAX) 0.25 MG TABLET    Take 1 tablet (0.25 mg total) by mouth 2 (two) times daily as needed for anxiety.   METHADONE (DOLOPHINE) 1 MG/1ML SOLUTION    Take 135 mg by mouth daily.   METHOCARBAMOL (ROBAXIN) 500 MG TABLET    Take 1 tablet (500 mg total) by mouth every 8 (eight) hours as needed for muscle spasms.   PANTOPRAZOLE (PROTONIX) 40 MG TABLET    Take 1 tablet (40 mg total) by mouth daily.  Modified Medications   No medications on file  Discontinued Medications   No medications on file    Subjective: Here for HFU for MSSA TV endocarditis complicated with septic pulmonary emboli and severe TVR (4/26 ) s/p debridement of rt atrial mass and TV vegetation with residual subvalvular component of the vegetation which was not accessible.  This likely was part of the torn chordae. OR cultures with MSSA. He received one dose of IV oritavancin on the day of discharge after completion of 2 weeks of IV cefazolin from date of OR 4/26.  He was discharged on 5/10 after one dose of IV oritavancin.  Accompanied by his girlfriend today. He received one dose of IV dalbavancin today, next dose due on 5/30 and 6/6. Complains of headache, some back ache for last 4-5 days. Fevers as high as 101-103 in the last 2-3 days. Went to the ED 5/20 and it was 101 but left as he felt OK. Taking tylenol/Ibuprofen for fevers. Also has  night sweats/body aches for last 4-5 days. Denies respiratory symptoms. Feels nauseous due to anxiety but no vomiting, abdominal pain and diarrhea. Denies GU symptoms, rashes and joint pain. Denies blurry vision, runny nose, sneezing. Appetite is good. He is on methadone. Continues to smoke half pack of cigarettes daily.   Review of Systems: ROS all systems reviewed with pertinent positives and   Past Medical History:  Diagnosis Date   Hepatitis    hx of hep C per pt   Past Surgical History:  Procedure Laterality Date   APPLICATION OF ANGIOVAC N/A 11/28/2021   Procedure: APPLICATION OF ANGIOVAC;  Surgeon: Corliss Skains, MD;  Location: [facility] OR;  Service: Vascular;  Laterality: N/A;   TEE WITHOUT CARDIOVERSION N/A 11/23/2021   Procedure: TRANSESOPHAGEAL ECHOCARDIOGRAM (TEE);  Surgeon: Christell Constant, MD;  Location[facility] ENDOSCOPY;  Service: Cardiovascular;  Laterality: N/A;   TEE WITHOUT CARDIOVERSION N/A 11/28/2021   Procedure: TRANSESOPHAGEAL ECHOCARDIOGRAM (TEE);  Surgeon: Corliss Skains, MD;  Location: [facility] OR;  Service: Open Heart Surgery;  Laterality: N/A;   tubes in ears     WRIST FRACTURE SURGERY      Social History   Tobacco Use   Smoking status: Every Day    Types: Cigarettes   Smokeless tobacco: Never  Substance Use Topics   Alcohol use: No   Drug use: No    No family history on file.  Allergies  Allergen Reactions   Penicillins Hives   Amoxil [Amoxicillin Trihydrate]     Health Maintenance  Topic Date Due   COVID-19 Vaccine (1) Never done   HIV Screening  Never done   INFLUENZA VACCINE  03/05/2022   TETANUS/TDAP  01/08/2031   Hepatitis C Screening  Completed   HPV VACCINES  Aged Out    Objective: BP 125/70   Pulse (!) 116   Temp 99.9 F (37.7 C) (Oral)   Wt 166 lb (75.3 kg)   BMI 23.82 kg/m    Physical Exam Constitutional:      Appearance: Normal appearance.  HENT:     Head: Normocephalic and atraumatic.      Mouth: Mucous  membranes are moist.  Eyes:    Conjunctiva/sclera: Conjunctivae normal.     Pupils:   Cardiovascular:     Rate and Rhythm: Normal rate and regular rhythm.     Heart sounds:   Pulmonary:     Effort: Pulmonary effort is normal.     Breath sounds: Normal breath sounds.   Abdominal:     General: Non distended     Palpations: soft.   Musculoskeletal:        General: Normal range of motion.   Skin:    General: Skin is warm and dry.     Comments:  Neurological:     General: grossly non focal     Mental Status: awake, alert and oriented to person, place, and time.   Psychiatric:        Mood and Affect: Mood normal.   Lab Results Lab Results  Component Value Date   WBC 9.4 12/22/2021   HGB 11.0 (L) 12/22/2021   HCT 34.9 (L) 12/22/2021   MCV 94.8 12/22/2021   PLT 129 (L) 12/22/2021    Lab Results  Component Value Date   CREATININE 0.97 12/22/2021   BUN 11 12/22/2021   NA 136 12/22/2021   K 3.8 12/22/2021   CL 101 12/22/2021   CO2 25 12/22/2021    Lab Results  Component Value Date   ALT 29 12/22/2021   AST 28 12/22/2021   ALKPHOS 62 12/22/2021   BILITOT 0.8 12/22/2021    No results found for: CHOL, HDL, LDLCALC, LDLDIRECT, TRIG, CHOLHDL No results found for: LABRPR, RPRTITER No results found for: HIV1RNAQUANT, HIV1RNAVL, CD4TABS   Problem List Items Addressed This Visit       Cardiovascular and Mediastinum   Endocarditis of tricuspid valve - Primary     Digestive   Hepatitis C, chronic ([name])   Relevant Orders   US ABDOMEN COMPLETE W/ELASTOGRAPHY   Liver Fibrosis, FibroTest-ActiTest   Protime-INR (Completed)   CBC (Completed)   Hepatic function panel (Completed)   Hepatitis B core antibody, total   HIV antibody (with reflex)     Other   Polysubstance abuse ([name]) (Chronic)   Fever   Relevant Orders   Blood culture (routine single)   Blood culture (routine single)   Smoking    Assessment/Plan # Fevers/night sweats  # MSSA native TV  endocarditis with multiple vegetations complicated with septic pulmonary emboli and rt pleural effusion ( no fluid to drain per IR 4/17) # Severe TVR - 4/26 s/p debridement of rt atrial mass and TV vegetation. OR findings Good results with debridement.  There is a residual subvalvular component of the vegetation which was not accessible.  This likely was part of the torn chordae. OR cultures with MSSA   # Hepatitis C    #  Polysubstance use - on methadone # Smoking - counseled   -Blood cultures *2 given reported h/o fevers and nigh sweats  -Labs today for treatment of Hepatitis C and US abdomen w elastography. He is interested in HCV treatment  - Complete remaining doses of Dalbavancin. He was supposed to get dalbavancin on 5/17 but seems it somehow got postponed to 5/22.   - Fu in 2 weeks   I have personally spent 45  minutes involved in face-to-face and non-face-to-face activities for this patient on the day of the visit including counse  Victoriano LainSabina Manadhar, MD [facility] 12/24/2021, 1:54 PM

## 2021-12-24 NOTE — Progress Notes (Signed)
Diagnosis: Endocarditis of tricuspid valve  Provider:  Chilton Greathouse, MD  Procedure: Infusion  IV Type: Peripheral, IV Location: R Forearm  Dalvance (Dalbavancin), Dose: 500 mg  Infusion Start Time: 1206  Infusion Stop Time: 1238  Post Infusion IV Care: Observation period completed and Peripheral IV Discontinued  Discharge: Condition: Good, Destination: Home . AVS provided to patient.   Performed by:  Adriana Mccallum, RN

## 2021-12-25 ENCOUNTER — Telehealth: Payer: Self-pay | Admitting: Internal Medicine

## 2021-12-25 DIAGNOSIS — R509 Fever, unspecified: Secondary | ICD-10-CM | POA: Insufficient documentation

## 2021-12-25 DIAGNOSIS — IMO0001 Reserved for inherently not codable concepts without codable children: Secondary | ICD-10-CM

## 2021-12-25 DIAGNOSIS — F17200 Nicotine dependence, unspecified, uncomplicated: Secondary | ICD-10-CM | POA: Insufficient documentation

## 2021-12-25 HISTORY — DX: Reserved for inherently not codable concepts without codable children: IMO0001

## 2021-12-25 HISTORY — DX: Nicotine dependence, unspecified, uncomplicated: F17.200

## 2021-12-25 NOTE — Telephone Encounter (Signed)
Attempted to call patient so that he can be instructed to come to emergency room to be evaluated and admitted for bactermia.

## 2021-12-26 ENCOUNTER — Encounter: Payer: Self-pay | Admitting: Infectious Diseases

## 2021-12-26 ENCOUNTER — Emergency Department (HOSPITAL_COMMUNITY): Payer: BLUE CROSS/BLUE SHIELD

## 2021-12-26 ENCOUNTER — Other Ambulatory Visit: Payer: Self-pay

## 2021-12-26 ENCOUNTER — Encounter (HOSPITAL_COMMUNITY): Payer: Self-pay

## 2021-12-26 ENCOUNTER — Telehealth: Payer: Self-pay

## 2021-12-26 ENCOUNTER — Inpatient Hospital Stay (HOSPITAL_COMMUNITY)
Admission: EM | Admit: 2021-12-26 | Discharge: 2022-01-10 | DRG: 539 | Disposition: A | Discharge status: Other Acute Inpatient Hospital | Payer: BLUE CROSS/BLUE SHIELD | Attending: Internal Medicine | Admitting: Internal Medicine

## 2021-12-26 DIAGNOSIS — Z79899 Other long term (current) drug therapy: Secondary | ICD-10-CM

## 2021-12-26 DIAGNOSIS — D649 Anemia, unspecified: Secondary | ICD-10-CM | POA: Diagnosis present

## 2021-12-26 DIAGNOSIS — Z86711 Personal history of pulmonary embolism: Secondary | ICD-10-CM | POA: Diagnosis not present

## 2021-12-26 DIAGNOSIS — I079 Rheumatic tricuspid valve disease, unspecified: Secondary | ICD-10-CM | POA: Diagnosis not present

## 2021-12-26 DIAGNOSIS — I2690 Septic pulmonary embolism without acute cor pulmonale: Secondary | ICD-10-CM | POA: Diagnosis present

## 2021-12-26 DIAGNOSIS — M4624 Osteomyelitis of vertebra, thoracic region: Principal | ICD-10-CM | POA: Diagnosis present

## 2021-12-26 DIAGNOSIS — Z880 Allergy status to penicillin: Secondary | ICD-10-CM

## 2021-12-26 DIAGNOSIS — B182 Chronic viral hepatitis C: Secondary | ICD-10-CM | POA: Diagnosis present

## 2021-12-26 DIAGNOSIS — Z720 Tobacco use: Secondary | ICD-10-CM | POA: Diagnosis present

## 2021-12-26 DIAGNOSIS — K219 Gastro-esophageal reflux disease without esophagitis: Secondary | ICD-10-CM | POA: Diagnosis present

## 2021-12-26 DIAGNOSIS — Z952 Presence of prosthetic heart valve: Secondary | ICD-10-CM | POA: Diagnosis present

## 2021-12-26 DIAGNOSIS — B9561 Methicillin susceptible Staphylococcus aureus infection as the cause of diseases classified elsewhere: Secondary | ICD-10-CM | POA: Diagnosis present

## 2021-12-26 DIAGNOSIS — I330 Acute and subacute infective endocarditis: Secondary | ICD-10-CM | POA: Diagnosis present

## 2021-12-26 DIAGNOSIS — K029 Dental caries, unspecified: Secondary | ICD-10-CM | POA: Diagnosis present

## 2021-12-26 DIAGNOSIS — F419 Anxiety disorder, unspecified: Secondary | ICD-10-CM | POA: Diagnosis present

## 2021-12-26 DIAGNOSIS — G894 Chronic pain syndrome: Secondary | ICD-10-CM | POA: Diagnosis present

## 2021-12-26 DIAGNOSIS — R9431 Abnormal electrocardiogram [ECG] [EKG]: Secondary | ICD-10-CM

## 2021-12-26 DIAGNOSIS — R7881 Bacteremia: Secondary | ICD-10-CM | POA: Diagnosis not present

## 2021-12-26 DIAGNOSIS — F17210 Nicotine dependence, cigarettes, uncomplicated: Secondary | ICD-10-CM | POA: Diagnosis present

## 2021-12-26 DIAGNOSIS — R110 Nausea: Principal | ICD-10-CM

## 2021-12-26 DIAGNOSIS — Z87891 Personal history of nicotine dependence: Secondary | ICD-10-CM | POA: Diagnosis present

## 2021-12-26 DIAGNOSIS — F1990 Other psychoactive substance use, unspecified, uncomplicated: Secondary | ICD-10-CM | POA: Diagnosis present

## 2021-12-26 DIAGNOSIS — F1910 Other psychoactive substance abuse, uncomplicated: Secondary | ICD-10-CM | POA: Diagnosis present

## 2021-12-26 LAB — URINALYSIS, ROUTINE W REFLEX MICROSCOPIC
Bilirubin Urine: NEGATIVE
Glucose, UA: NEGATIVE mg/dL
Hgb urine dipstick: NEGATIVE
Ketones, ur: NEGATIVE mg/dL
Leukocytes,Ua: NEGATIVE
Nitrite: NEGATIVE
Protein, ur: NEGATIVE mg/dL
Specific Gravity, Urine: 1.015 (ref 1.005–1.030)
pH: 6 (ref 5.0–8.0)

## 2021-12-26 LAB — COMPREHENSIVE METABOLIC PANEL
ALT: 29 U/L (ref 0–44)
AST: 28 U/L (ref 15–41)
Albumin: 3.1 g/dL — ABNORMAL LOW (ref 3.5–5.0)
Alkaline Phosphatase: 64 U/L (ref 38–126)
Anion gap: 10 (ref 5–15)
BUN: <5 mg/dL — ABNORMAL LOW (ref 6–20)
CO2: 25 mmol/L (ref 22–32)
Calcium: 8.8 mg/dL — ABNORMAL LOW (ref 8.9–10.3)
Chloride: 103 mmol/L (ref 98–111)
Creatinine, Ser: 0.63 mg/dL (ref 0.61–1.24)
GFR, Estimated: >60 mL/min (ref >60)
Glucose, Bld: 88 mg/dL (ref 70–99)
Potassium: 3.9 mmol/L (ref 3.5–5.1)
Sodium: 138 mmol/L (ref 135–145)
Total Bilirubin: 0.5 mg/dL (ref 0.3–1.2)
Total Protein: 7.3 g/dL (ref 6.5–8.1)

## 2021-12-26 LAB — RAPID URINE DRUG SCREEN, HOSP PERFORMED
Amphetamines: NOT DETECTED
Barbiturates: NOT DETECTED
Benzodiazepines: NOT DETECTED
Cocaine: NOT DETECTED
Opiates: NOT DETECTED
Tetrahydrocannabinol: POSITIVE — AB

## 2021-12-26 LAB — FERRITIN: Ferritin: 131 ng/mL (ref 24–336)

## 2021-12-26 LAB — CBC WITH DIFFERENTIAL/PLATELET
Abs Immature Granulocytes: 0.04 10*3/uL (ref 0.00–0.07)
Basophils Absolute: 0 10*3/uL (ref 0.0–0.1)
Basophils Relative: 0 %
Eosinophils Absolute: 0.1 10*3/uL (ref 0.0–0.5)
Eosinophils Relative: 1 %
HCT: 34.5 % — ABNORMAL LOW (ref 39.0–52.0)
Hemoglobin: 11.1 g/dL — ABNORMAL LOW (ref 13.0–17.0)
Immature Granulocytes: 1 %
Lymphocytes Relative: 22 %
Lymphs Abs: 1.6 10*3/uL (ref 0.7–4.0)
MCH: 30.3 pg (ref 26.0–34.0)
MCHC: 32.2 g/dL (ref 30.0–36.0)
MCV: 94.3 fL (ref 80.0–100.0)
Monocytes Absolute: 0.7 10*3/uL (ref 0.1–1.0)
Monocytes Relative: 10 %
Neutro Abs: 5 10*3/uL (ref 1.7–7.7)
Neutrophils Relative %: 66 %
Platelets: 144 10*3/uL — ABNORMAL LOW (ref 150–400)
RBC: 3.66 MIL/uL — ABNORMAL LOW (ref 4.22–5.81)
RDW: 14.3 % (ref 11.5–15.5)
WBC: 7.5 10*3/uL (ref 4.0–10.5)
nRBC: 0 % (ref 0.0–0.2)

## 2021-12-26 LAB — LACTIC ACID, PLASMA
Lactic Acid, Venous: 2.2 mmol/L (ref 0.5–1.9)
Lactic Acid, Venous: 1.4 mmol/L (ref 0.5–1.9)

## 2021-12-26 LAB — SEDIMENTATION RATE: Sed Rate: 49 mm/hr — ABNORMAL HIGH (ref 0–16)

## 2021-12-26 LAB — IRON AND TIBC
Iron: 23 ug/dL — ABNORMAL LOW (ref 45–182)
Saturation Ratios: 11 % — ABNORMAL LOW (ref 17.9–39.5)
TIBC: 214 ug/dL — ABNORMAL LOW (ref 250–450)
UIBC: 191 ug/dL

## 2021-12-26 LAB — TSH: TSH: 2.125 u[IU]/mL (ref 0.350–4.500)

## 2021-12-26 LAB — C-REACTIVE PROTEIN: CRP: 12.1 mg/dL — ABNORMAL HIGH (ref <1.0)

## 2021-12-26 LAB — MAGNESIUM: Magnesium: 2 mg/dL (ref 1.7–2.4)

## 2021-12-26 IMAGING — CT CT ANGIO CHEST
3 of 7 series · 18 of 36 positions shown · IV contrast (agent unspecified)
Comparison: None Available.

CLINICAL DATA: Pt arrived POV from home c/o back pin and nausea x1
week. Pt states he had blood cultures drawn through infectious
disease and they called stating they were positive and to come here.

EXAM:
CT ANGIOGRAPHY CHEST WITH CONTRAST
TECHNIQUE: Multidetector CT imaging of the chest was performed using the
standard protocol during bolus administration of intravenous
contrast. Multiplanar CT image reconstructions and MIPs were
obtained to evaluate the vascular anatomy.

[Series 6: pe thins · axial · 0.88mm/px · z∈[+810,+1074]mm · 15 of 434 slices shown]
[im 28/434  lung]
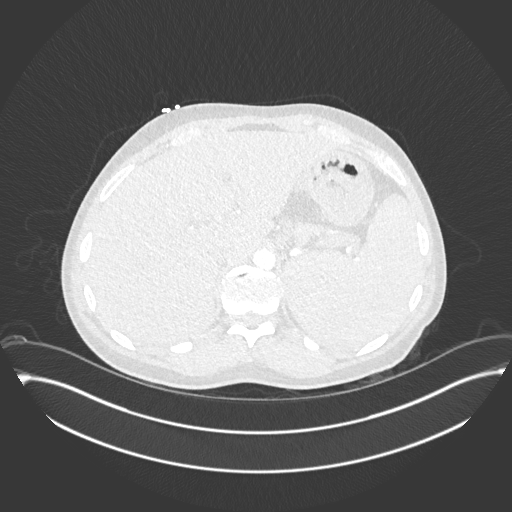
[im 55/434  mediastinal]
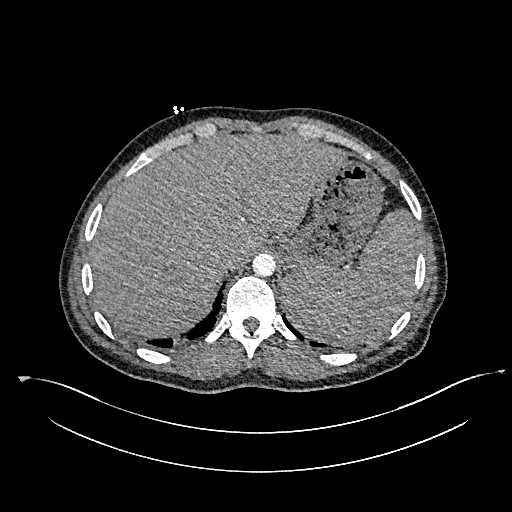
[im 82/434  lung]
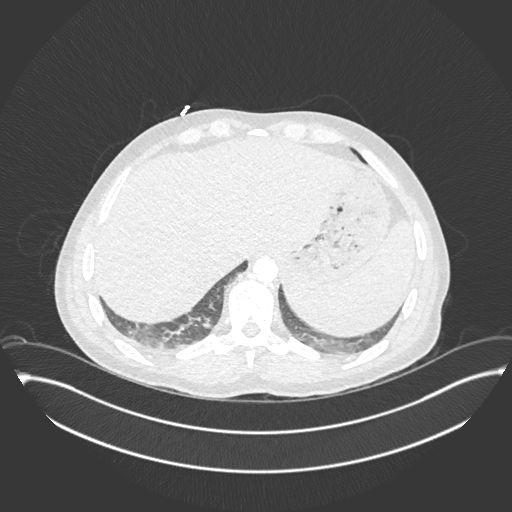
[im 109/434  mediastinal]
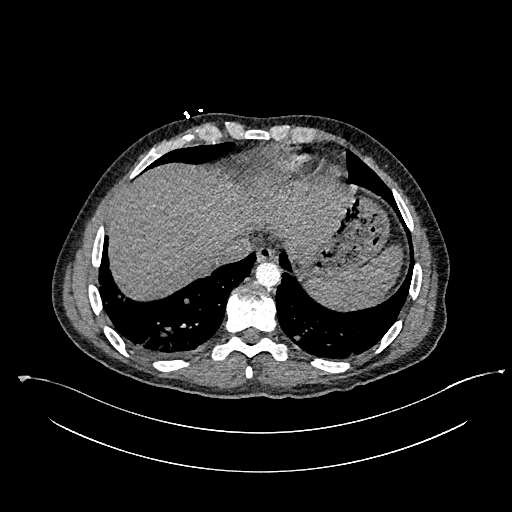
[im 136/434  lung]
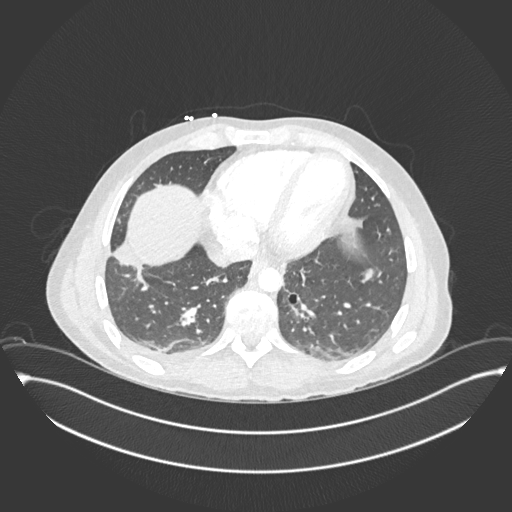
[im 163/434  mediastinal]
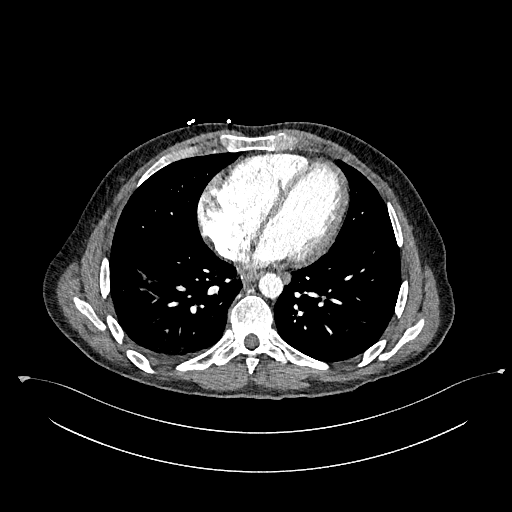
[im 190/434  lung]
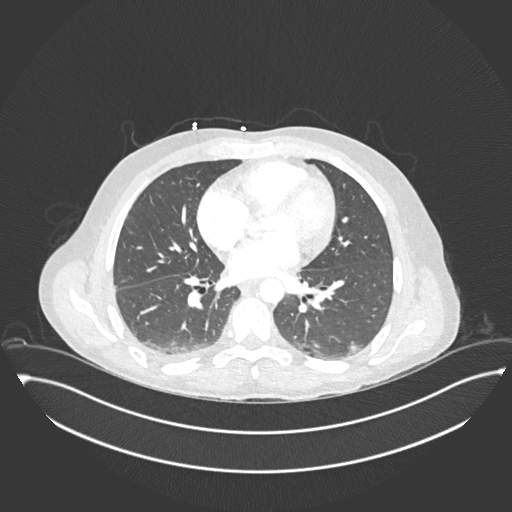
[im 217/434  mediastinal]
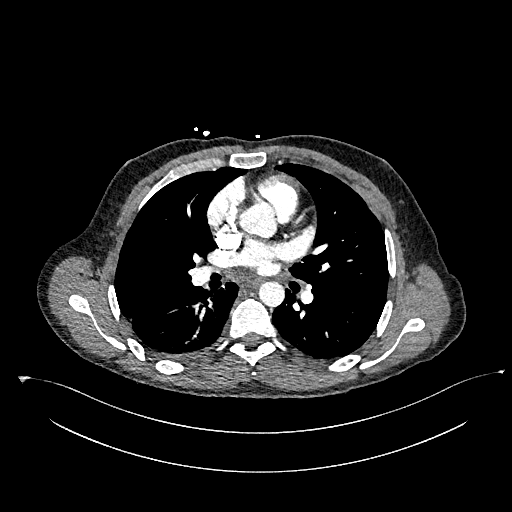
[im 244/434  lung]
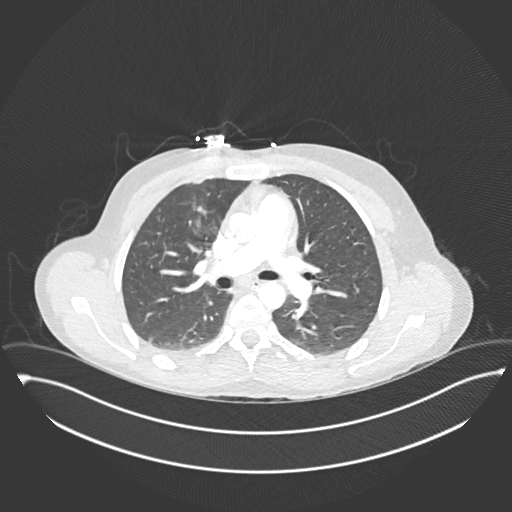
[im 271/434  mediastinal]
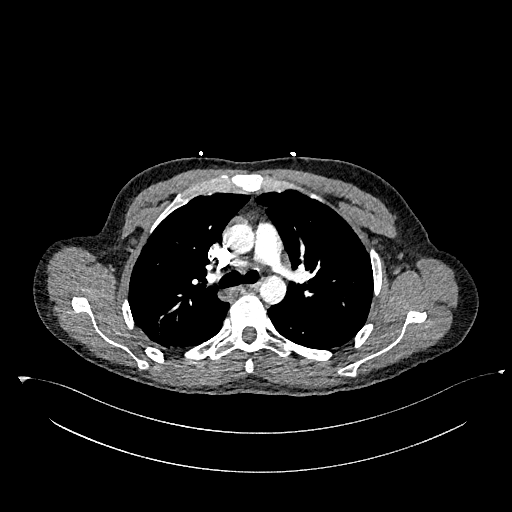
[im 298/434  lung]
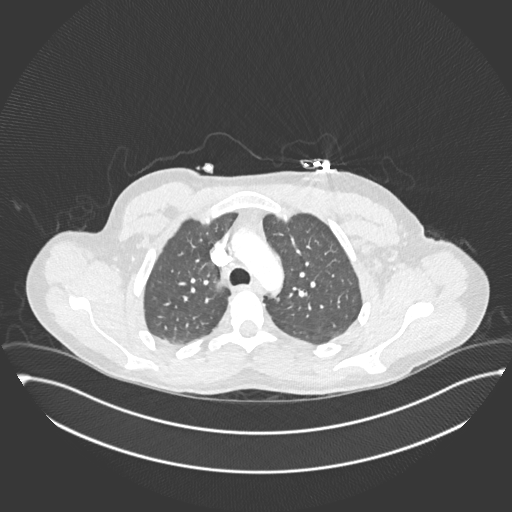
[im 325/434  mediastinal]
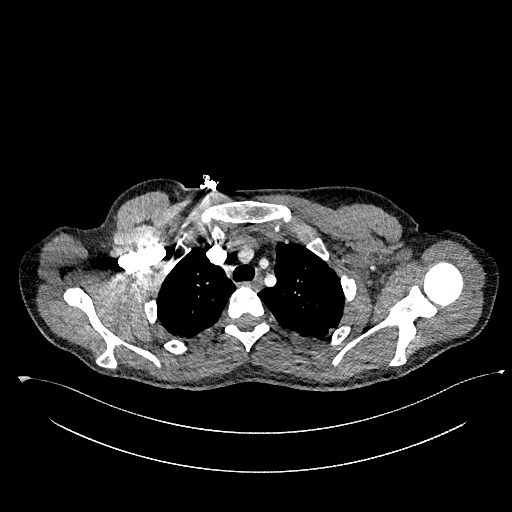
[im 352/434  lung]
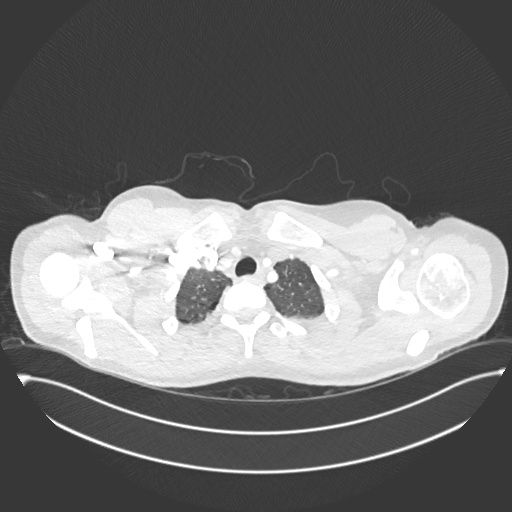
[im 379/434  mediastinal]
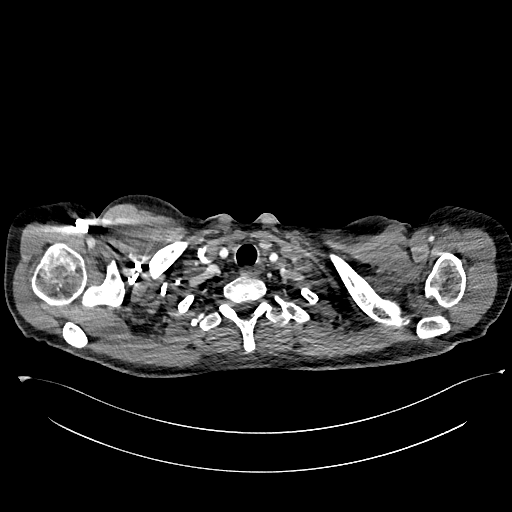
[im 406/434  lung]
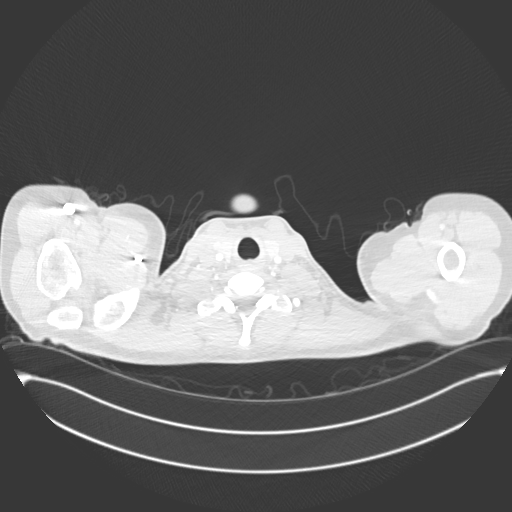

[Series 7: pe lung · axial · 0.71mm/px · z∈[+866,+932]mm · 2 of 134 slices shown]
[im 34/134  mediastinal]
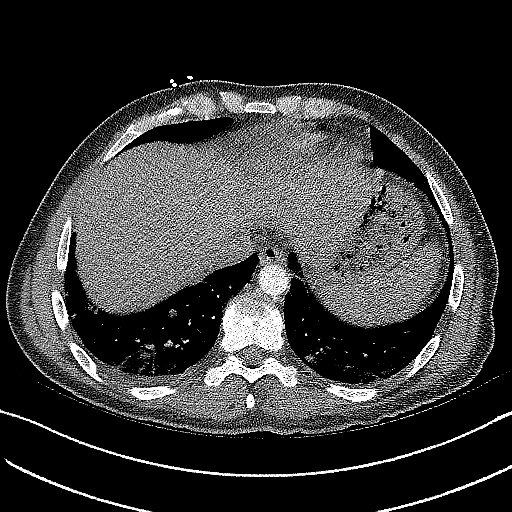
[im 67/134  mediastinal]
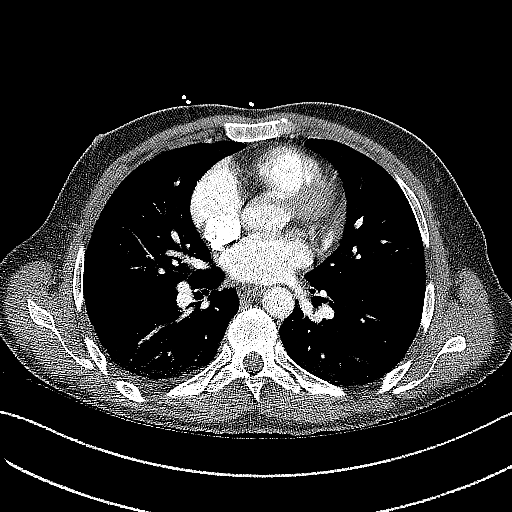

[Series 8: pe 2mm cor · coronal · 0.59mm/px · 1 of 148 slices shown]
[im 74/148  mediastinal]
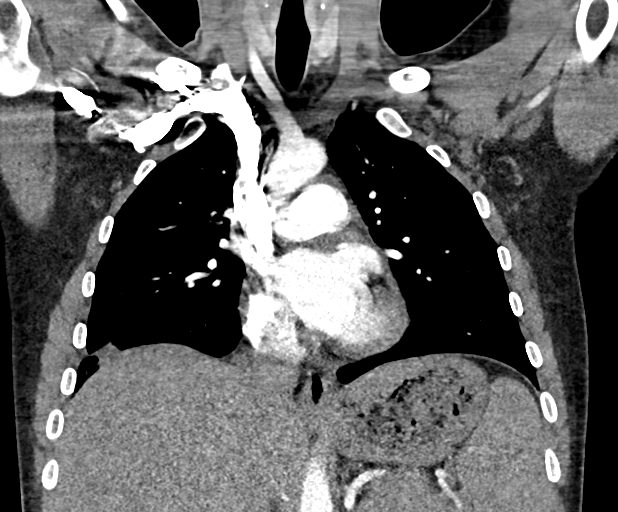

[18 of 36 positions shown; findings below may reference images not displayed]

RADIATION DOSE REDUCTION: This exam was performed according to the
departmental dose-optimization program which includes automated
exposure control, adjustment of the mA and/or kV according to
patient size and/or use of iterative reconstruction technique.

CONTRAST:  100mL OMNIPAQUE IOHEXOL 350 MG/ML SOLN
FINDINGS: Cardiovascular: No filling defects within the pulmonary arteries to
suggest acute pulmonary embolism. Heart appears normal.

Mediastinum/Nodes: No axillary or supraclavicular adenopathy. No
mediastinal or hilar adenopathy. No pericardial fluid. Esophagus
normal.

Lungs/Pleura: Cavitary nodule with gas and fluid at the RIGHT lung
base measures 2.3 x 1.5 cm (105/7). Solid nodule in the RIGHT lower
lobe measures 6 mm on image 101/7).

Semi-solid nodule in the LEFT lower lobe measures 7 mm on image [DATE].

Solid nodule along the RIGHT horizontal fissure measures 7 mm image
62.

Solid nodule in LEFT upper lobe measures 3 mm on image [DATE].

Smaller additional peripheral subpleural nodules in the RIGHT lung
on image 74/7

Upper Abdomen: Limited view of the liver, kidneys, pancreas are
unremarkable. Normal adrenal glands.

Musculoskeletal: No aggressive osseous lesion.

Review of the MIP images confirms the above findings.
IMPRESSION: 1. Cavitary nodule with fluid and gas in the RIGHT lower lobe is
concerning for pulmonary infection.
2. Additional scattered pulmonary nodules in the LEFT and RIGHT lung
are concerning for early pulmonary infections.
3. Similar pattern of pulmonary nodules in the lower lobes on CT
abdomen [DATE].
4. No pulmonary embolism.  Heart appears normal.

## 2021-12-26 IMAGING — CR DG CHEST 2V
2 series · 2 of 2 positions shown · non-contrast
Comparison: Chest radiographs [DATE] and earlier

CLINICAL DATA: Provided history: Shortness of breath.

EXAM:
CHEST - 2 VIEW

[chest pa]
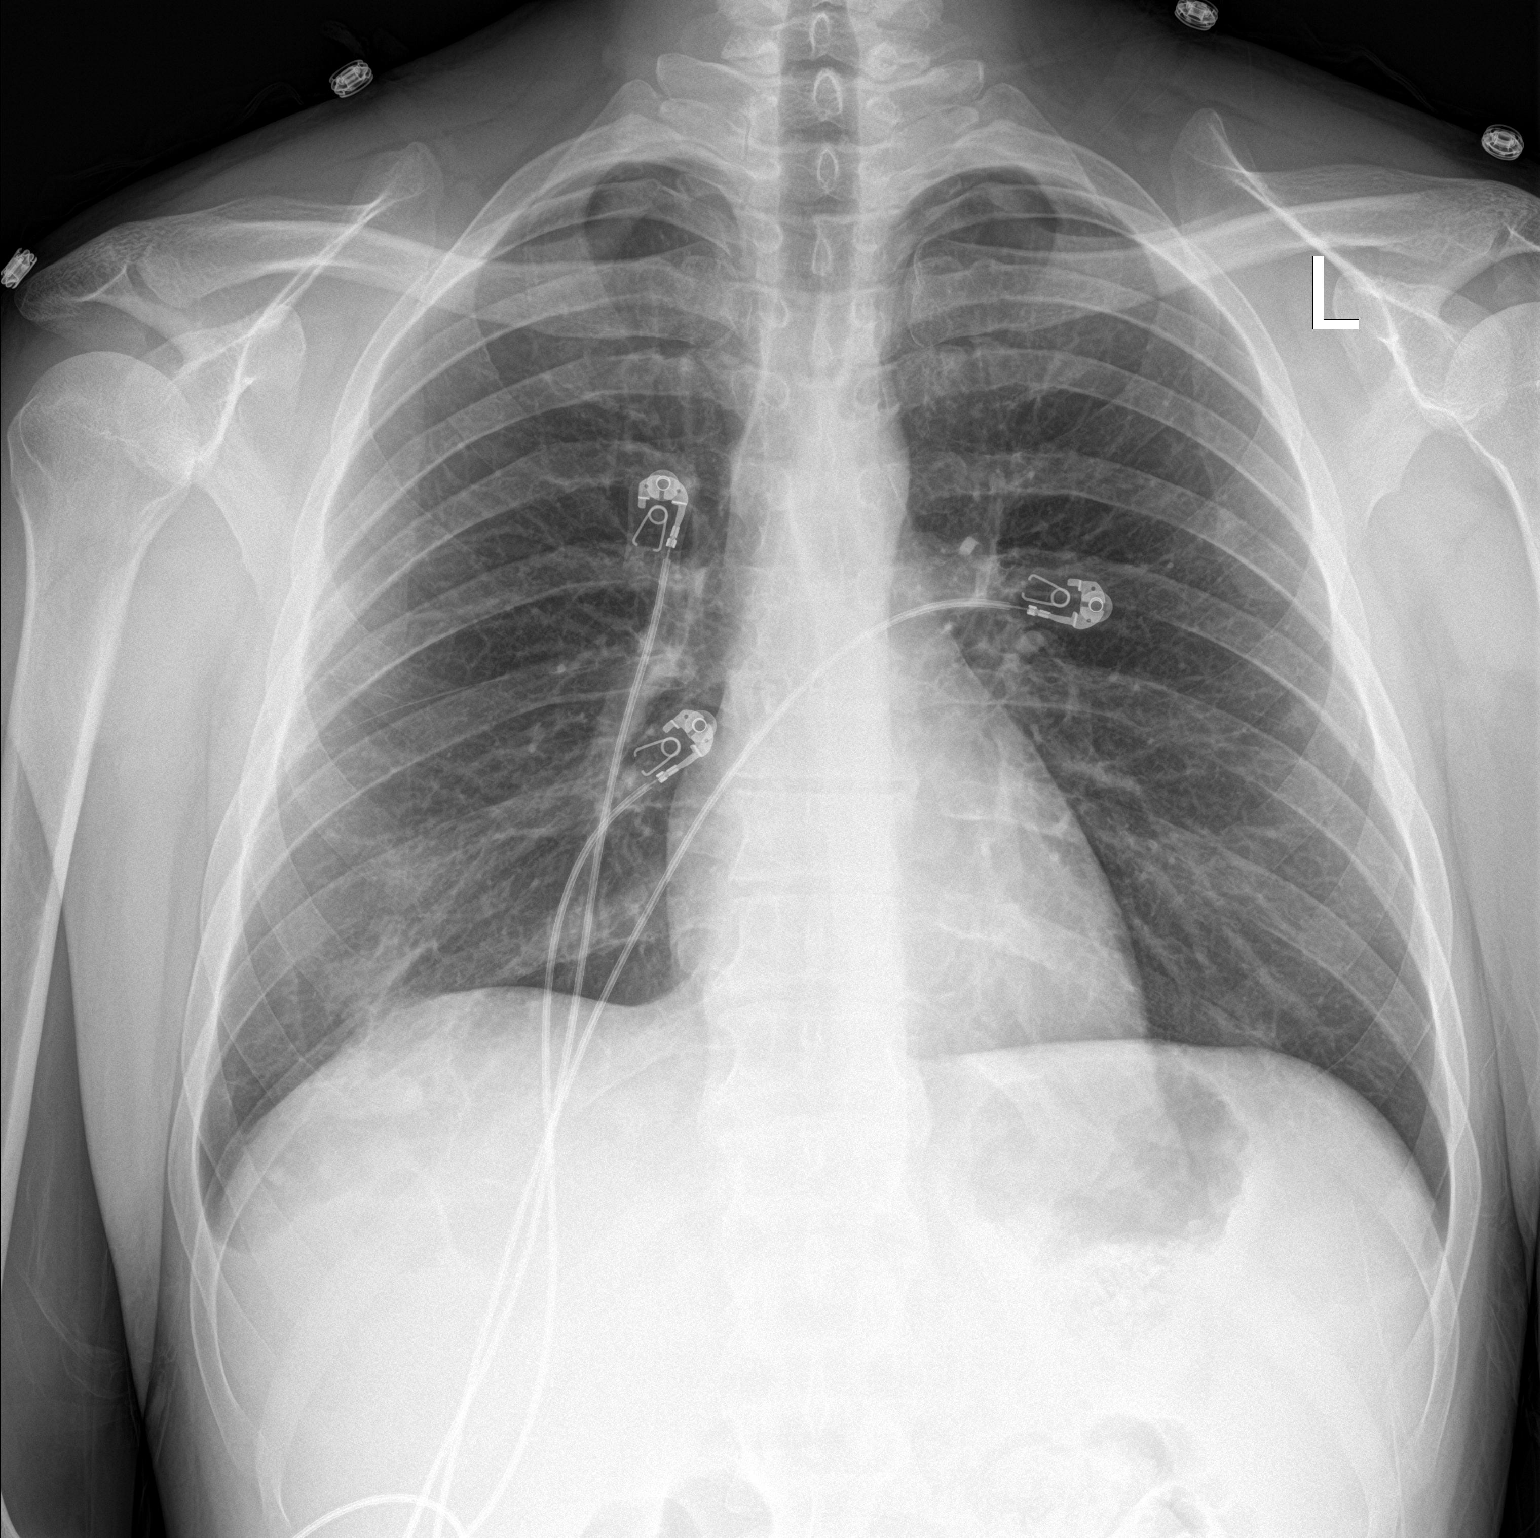

[chest lat]
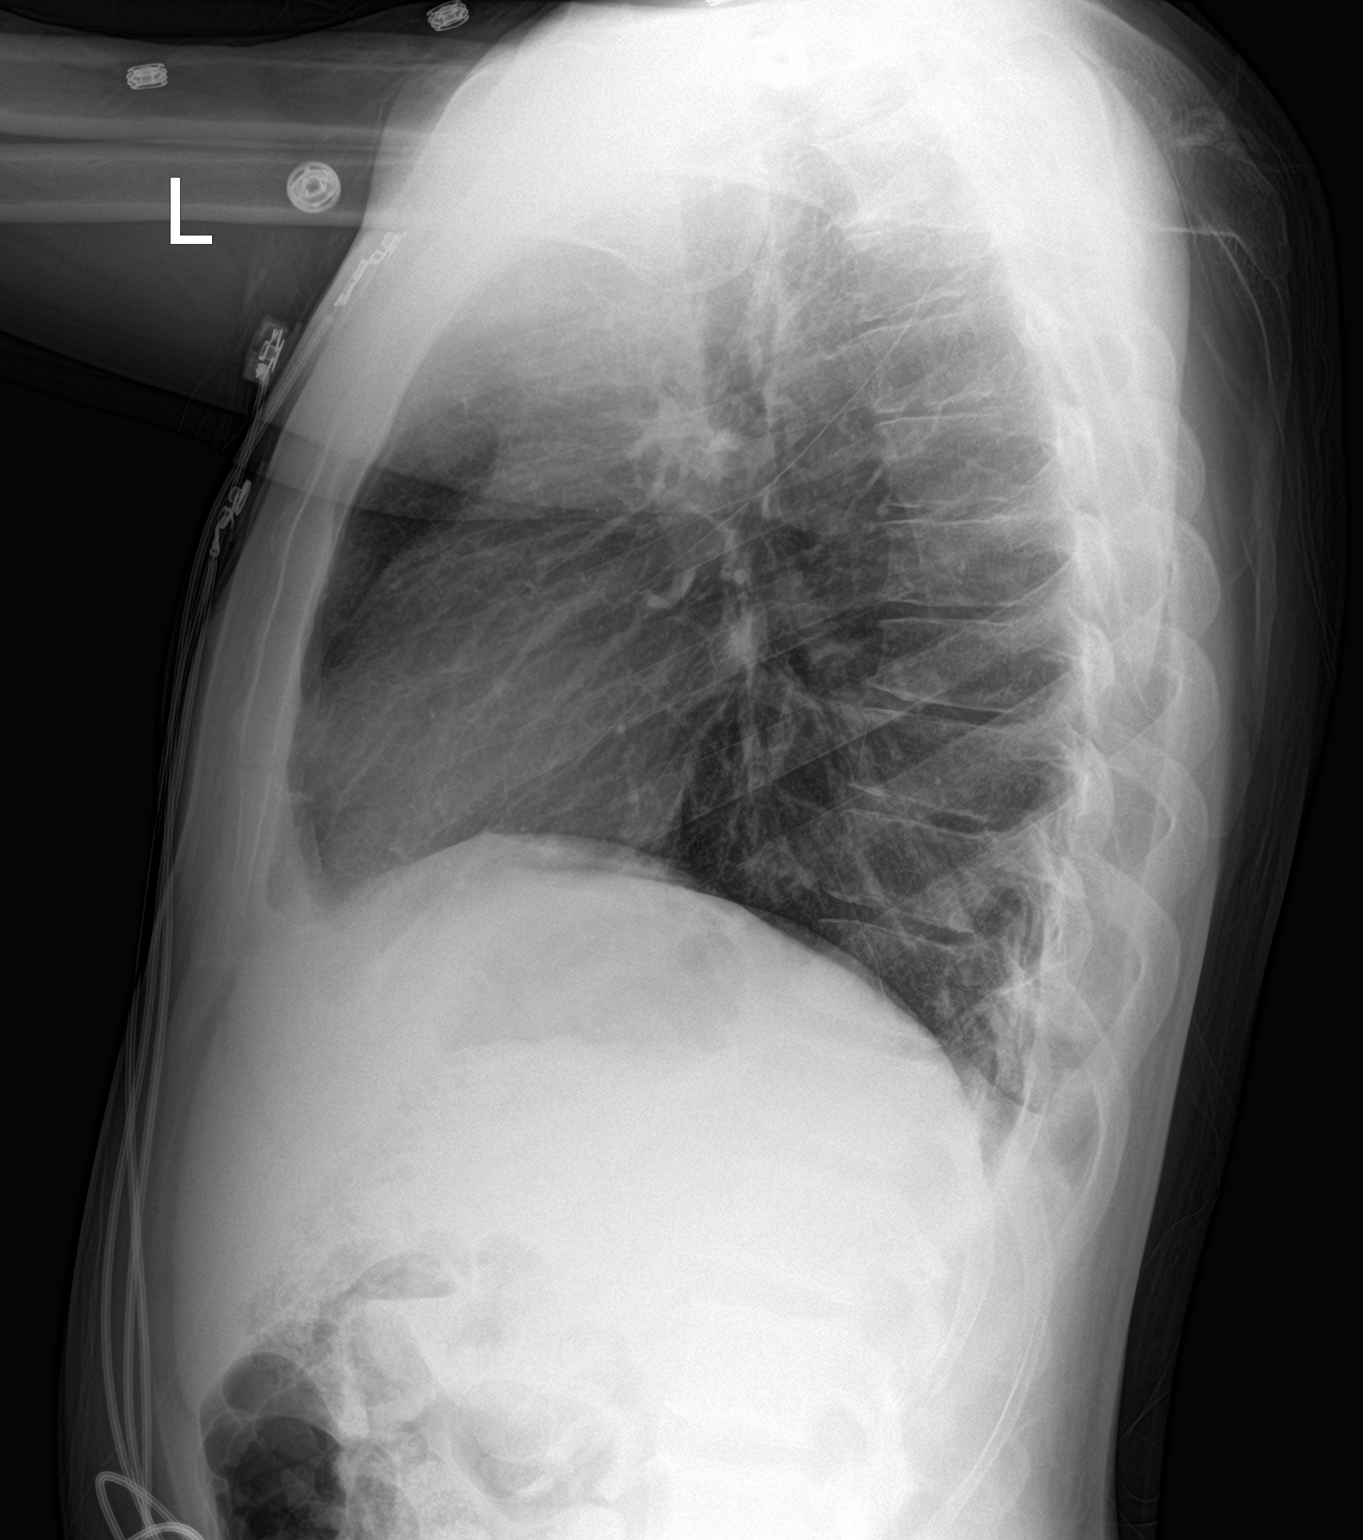

[2 of 2 positions shown; findings below may reference images not displayed]

FINDINGS: Heart size within normal limits. Small ill-defined opacity within
the right lung base, similar to the prior chest radiograph
[DATE]. No appreciable airspace consolidation within the left
lung. No sizable pleural effusion. No evidence of pneumothorax. No
acute bony abnormality identified.
IMPRESSION: Small ill-defined opacity within the right lung base, similar to the
prior chest radiographs of [DATE]. This may reflect scarring or
incompletely resolved right lower lobe pneumonia.

## 2021-12-26 MED ORDER — CLONAZEPAM 0.5 MG PO TABS
0.5000 mg | ORAL_TABLET | Freq: Two times a day (BID) | ORAL | Status: DC | PRN
  Administered 2021-12-26 – 2021-12-28 (×4): 0.5 mg via ORAL
  Filled 2021-12-26 (×4): qty 1

## 2021-12-26 MED ORDER — PANTOPRAZOLE SODIUM 40 MG PO TBEC
40.0000 mg | DELAYED_RELEASE_TABLET | Freq: Every day | ORAL | Status: DC
  Administered 2021-12-26 – 2022-01-10 (×16): 40 mg via ORAL
  Filled 2021-12-26 (×16): qty 1

## 2021-12-26 MED ORDER — SODIUM CHLORIDE 0.9% FLUSH
3.0000 mL | Freq: Two times a day (BID) | INTRAVENOUS | Status: DC
  Administered 2021-12-26 – 2022-01-10 (×29): 3 mL via INTRAVENOUS

## 2021-12-26 MED ORDER — SODIUM CHLORIDE 0.9 % IV SOLN
250.0000 mL | INTRAVENOUS | Status: DC | PRN
  Administered 2022-01-09: 250 mL via INTRAVENOUS

## 2021-12-26 MED ORDER — METHOCARBAMOL 500 MG PO TABS
500.0000 mg | ORAL_TABLET | Freq: Three times a day (TID) | ORAL | Status: DC | PRN
  Administered 2021-12-26 – 2022-01-10 (×16): 500 mg via ORAL
  Filled 2021-12-26 (×17): qty 1

## 2021-12-26 MED ORDER — ACETAMINOPHEN 325 MG PO TABS
650.0000 mg | ORAL_TABLET | Freq: Four times a day (QID) | ORAL | Status: DC | PRN
  Filled 2021-12-26: qty 2

## 2021-12-26 MED ORDER — METHADONE HCL 5 MG/5ML PO SOLN
135.0000 mg | Freq: Every day | ORAL | Status: DC
  Filled 2021-12-26: qty 500

## 2021-12-26 MED ORDER — SODIUM CHLORIDE 0.9% FLUSH: 3.0000 mL | INTRAVENOUS | Status: DC | PRN

## 2021-12-26 MED ORDER — LORAZEPAM 2 MG/ML IJ SOLN
1.0000 mg | Freq: Once | INTRAMUSCULAR | Status: AC
  Administered 2021-12-26: 1 mg via INTRAVENOUS
  Filled 2021-12-26: qty 1

## 2021-12-26 MED ORDER — IOHEXOL 350 MG/ML SOLN
100.0000 mL | Freq: Once | INTRAVENOUS | Status: AC | PRN
Start: 2021-12-26 — End: 2021-12-26
  Administered 2021-12-26: 100 mL via INTRAVENOUS

## 2021-12-26 MED ORDER — VANCOMYCIN HCL 1750 MG/350ML IV SOLN
1750.0000 mg | Freq: Once | INTRAVENOUS | Status: AC
  Administered 2021-12-26: 1750 mg via INTRAVENOUS
  Filled 2021-12-26: qty 350

## 2021-12-26 MED ORDER — VANCOMYCIN HCL 1750 MG/350ML IV SOLN
1750.0000 mg | Freq: Two times a day (BID) | INTRAVENOUS | Status: DC
  Administered 2021-12-27 – 2021-12-28 (×4): 1750 mg via INTRAVENOUS
  Filled 2021-12-26 (×4): qty 350

## 2021-12-26 MED ORDER — HYDROCODONE-ACETAMINOPHEN 5-325 MG PO TABS
1.0000 | ORAL_TABLET | Freq: Four times a day (QID) | ORAL | Status: DC | PRN
  Administered 2021-12-26 – 2022-01-10 (×37): 2 via ORAL
  Filled 2021-12-26 (×30): qty 2
  Filled 2021-12-26: qty 1
  Filled 2021-12-26 (×5): qty 2
  Filled 2021-12-26: qty 1
  Filled 2021-12-26: qty 2

## 2021-12-26 MED ORDER — KETOROLAC TROMETHAMINE 15 MG/ML IJ SOLN
15.0000 mg | Freq: Once | INTRAMUSCULAR | Status: AC
  Administered 2021-12-26: 15 mg via INTRAVENOUS
  Filled 2021-12-26: qty 1

## 2021-12-26 MED ORDER — ACETAMINOPHEN 650 MG RE SUPP: 650.0000 mg | Freq: Four times a day (QID) | RECTAL | Status: DC | PRN

## 2021-12-26 NOTE — Assessment & Plan Note (Signed)
Check magnesium Will repeat ekg in AM Watch on telemetry On methadone, will need to monitor

## 2021-12-26 NOTE — Assessment & Plan Note (Signed)
Check iron studies  trend

## 2021-12-26 NOTE — ED Notes (Signed)
Lactic 2.2. PA made aware.

## 2021-12-26 NOTE — Assessment & Plan Note (Addendum)
States he has not used meth since being in hospital in April [facility]

## 2021-12-26 NOTE — Assessment & Plan Note (Signed)
Smokes 1/2 PPD, declines nicotine patch

## 2021-12-26 NOTE — Assessment & Plan Note (Addendum)
Continue methadone 

## 2021-12-26 NOTE — Assessment & Plan Note (Addendum)
31 year old with history of IV drug abuse who was hospitalized in April of 2023 through May with MSSA bacteremia with tricuspid valve endocarditis, severe TR and septic pulmonary emboli who presented to ED with fevers and +BC from ID clinic growing gram positive cocci -admit to telemetry  -ID consulted and recommended IV vancomycin. No echo at this time or other antibiotics -repeat blood cultures pending -no leukocytosis, repeat lactic acid wnl and no criteria for sepsis. He has been afebrile while he has been in ED  -CTA of chest does show cavitary nodule with fluid/gas in right lower lube as well as scattered pulmonary  Nodules in left and right lung, consistent with known septic pulmonary emboli. Discussed with ID, continue IV vancomycin for now.

## 2021-12-26 NOTE — Telephone Encounter (Signed)
-----   Message from Rosiland Oz, MD sent at 12/26/2021  8:16 AM EDT ----- Attempted twice to reach patient in his listed phone number in EMR for + blood cultures. Voice mail is full. Will have office staff to reach out again.

## 2021-12-26 NOTE — Assessment & Plan Note (Signed)
Continue PPI ?

## 2021-12-26 NOTE — Progress Notes (Signed)
Pt arrived to 5W02 from ED. VS WNL. Telemetry connected. Orland Mustard, MD notified. Pt oriented to room. All questions answered. Call bell w/in reach.

## 2021-12-26 NOTE — ED Provider Notes (Signed)
[facility] EMERGENCY DEPARTMENT Provider Note   CSN: [id number] Arrival date & time: 12/26/21  1106     History  Chief Complaint  Patient presents with   Back Pain   Nausea    Reginald Clarke is a 31 y.o. male.  31 y.o male with a PMH of  IV drug use, gastroesophageal flux disease, chronic pain (follow-up as an outpatient for methadone clinic) and recent hospital admission at the end of April and recently discharged on 05/10; who presents to the ED after being called with abnormal blood culture results for Gram positive cocci isolated.  He received one dose of IV dalbavancin on 5/22, next dose due on 5/30 and 6/6 according to records.  Presents to the ED today with worsening body aches, headaches, night sweats along with right scapula pain exacerbated with deep inspiration.  Also reports some nausea states this is likely due to his worsening anxiety disorder.  Fevers with a Tmax of 103 while at home, taking Tylenol for the symptoms.  Seen by infectious disease Dr. Victoriano Lain two days ago.  He denies any IV drug use since the month of April.  Denies any chest pain, shortness of breath, abdominal pain.     The history is provided by the patient and a parent.  Back Pain Location:  Thoracic spine Associated symptoms: fever and headaches   Associated symptoms: no abdominal pain and no chest pain       Home Medications Prior to Admission medications   Medication Sig Start Date End Date Taking? Authorizing Provider  ALPRAZolam (XANAX) 0.25 MG tablet Take 1 tablet (0.25 mg total) by mouth 2 (two) times daily as needed for anxiety. Patient not taking: Reported on 12/24/2021 12/12/21   Osvaldo Shipper, MD  methadone (DOLOPHINE) 1 MG/1ML solution Take 135 mg by mouth daily. 04/24/20   [provider]  methocarbamol (ROBAXIN) 500 MG tablet Take 1 tablet (500 mg total) by mouth every 8 (eight) hours as needed for muscle spasms. 12/12/21   Osvaldo Shipper, MD   pantoprazole (PROTONIX) 40 MG tablet Take 1 tablet (40 mg total) by mouth daily. 12/13/21   Osvaldo Shipper, MD      Allergies    Penicillins and Amoxil [amoxicillin trihydrate]    Review of Systems   Review of Systems  Constitutional:  Positive for chills, fatigue and fever.  HENT:  Negative for sore throat.   Respiratory:  Negative for shortness of breath.   Cardiovascular:  Negative for chest pain.  Gastrointestinal:  Negative for abdominal pain, nausea and vomiting.  Genitourinary:  Negative for flank pain.  Musculoskeletal:  Positive for back pain.  Skin:  Negative for pallor and wound.  Neurological:  Positive for light-headedness and headaches.  All other systems reviewed and are negative.  Physical Exam Updated Vital Signs BP 113/69   Pulse 91   Temp 98.9 F (37.2 C) (Oral)   Resp (!) 29   Ht 5\' 10"  (1.778 m)   Wt 76.2 kg   SpO2 100%   BMI 24.11 kg/m  Physical Exam Vitals and nursing note reviewed.  Constitutional:      Appearance: Normal appearance.  HENT:     Head: Normocephalic and atraumatic.     Mouth/Throat:     Mouth: Mucous membranes are moist.  Eyes:     Pupils: Pupils are equal, round, and reactive to light.  Cardiovascular:     Rate and Rhythm: Normal rate.  Pulmonary:     Effort: Pulmonary  effort is normal.     Breath sounds: Examination of the right-middle field reveals decreased breath sounds. Examination of the right-lower field reveals decreased breath sounds. Examination of the left-lower field reveals decreased breath sounds. Decreased breath sounds present. No wheezing.    Abdominal:     General: Abdomen is flat.  Musculoskeletal:     Cervical back: Normal range of motion and neck supple.  Skin:    General: Skin is warm and dry.  Neurological:     Mental Status: He is alert and oriented to person, place, and time.    ED Results / Procedures / Treatments   Labs (all labs ordered are listed, but only abnormal results are  displayed) Labs Reviewed  CBC WITH DIFFERENTIAL/PLATELET - Abnormal; Notable for the following components:      Result Value   RBC 3.66 (*)    Hemoglobin 11.1 (*)    HCT 34.5 (*)    Platelets 144 (*)    All other components within normal limits  COMPREHENSIVE METABOLIC PANEL - Abnormal; Notable for the following components:   BUN <5 (*)    Calcium 8.8 (*)    Albumin 3.1 (*)    All other components within normal limits  LACTIC ACID, PLASMA - Abnormal; Notable for the following components:   Lactic Acid, Venous 2.2 (*)    All other components within normal limits  SEDIMENTATION RATE - Abnormal; Notable for the following components:   Sed Rate 49 (*)    All other components within normal limits  C-REACTIVE PROTEIN - Abnormal; Notable for the following components:   CRP 12.1 (*)    All other components within normal limits  CULTURE, BLOOD (ROUTINE X 2)  CULTURE, BLOOD (ROUTINE X 2)  LACTIC ACID, PLASMA  URINALYSIS, ROUTINE W REFLEX MICROSCOPIC    EKG None  Radiology DG Chest 2 View  Result Date: 12/26/2021 CLINICAL DATA:  Provided history: Shortness of breath. EXAM: CHEST - 2 VIEW COMPARISON:  Chest radiographs 12/22/2021 and earlier FINDINGS: Heart size within normal limits. Small ill-defined opacity within the right lung base, similar to the prior chest radiograph 12/22/2021. No appreciable airspace consolidation within the left lung. No sizable pleural effusion. No evidence of pneumothorax. No acute bony abnormality identified. IMPRESSION: Small ill-defined opacity within the right lung base, similar to the prior chest radiographs of 12/22/2021. This may reflect scarring or incompletely resolved right lower lobe pneumonia. Electronically Signed   By: Jackey Loge D.O.   On: 12/26/2021 12:41   CT Angio Chest PE W and/or Wo Contrast  Result Date: 12/26/2021 CLINICAL DATA:  Pt arrived POV from home c/o back pin and nausea x1 week. Pt states he had blood cultures drawn through  infectious disease and they called stating they were positive and to come here. EXAM: CT ANGIOGRAPHY CHEST WITH CONTRAST TECHNIQUE: Multidetector CT imaging of the chest was performed using the standard protocol during bolus administration of intravenous contrast. Multiplanar CT image reconstructions and MIPs were obtained to evaluate the vascular anatomy. RADIATION DOSE REDUCTION: This exam was performed according to the departmental dose-optimization program which includes automated exposure control, adjustment of the mA and/or kV according to patient size and/or use of iterative reconstruction technique. CONTRAST:  OMNIPAQUE IOHEXOL 350 MG/ML SOLN COMPARISON:  None Available. FINDINGS: Cardiovascular: No filling defects within the pulmonary arteries to suggest acute pulmonary embolism. Heart appears normal. Mediastinum/Nodes: No axillary or supraclavicular adenopathy. No mediastinal or hilar adenopathy. No pericardial fluid. Esophagus normal. Lungs/Pleura: Cavitary nodule with  gas and fluid at the RIGHT lung base measures 2.3 x 1.5 cm (105/7). Solid nodule in the RIGHT lower lobe measures 6 mm on image 101/7). Semi-solid nodule in the LEFT lower lobe measures 7 mm on image 7/7. Solid nodule along the RIGHT horizontal fissure measures 7 mm image 62. Solid nodule in LEFT upper lobe measures 3 mm on image 29/7. Smaller additional peripheral subpleural nodules in the RIGHT lung on image 74/7 Upper Abdomen: Limited view of the liver, kidneys, pancreas are unremarkable. Normal adrenal glands. Musculoskeletal: No aggressive osseous lesion. Review of the MIP images confirms the above findings. IMPRESSION: 1. Cavitary nodule with fluid and gas in the RIGHT lower lobe is concerning for pulmonary infection. 2. Additional scattered pulmonary nodules in the LEFT and RIGHT lung are concerning for early pulmonary infections. 3. Similar pattern of pulmonary nodules in the lower lobes on CT abdomen 11/11/2021. 4. No  pulmonary embolism.  Heart appears normal. Electronically Signed   By: Genevive Bi M.D.   On: 12/26/2021 14:56    Procedures Procedures    Medications Ordered in ED Medications  vancomycin (VANCOREADY) IVPB 1750 mg/350 mL (1,750 mg Intravenous New Bag/Given 12/26/21 1441)  vancomycin (VANCOREADY) IVPB 1750 mg/350 mL (has no administration in time range)  LORazepam (ATIVAN) injection 1 mg (1 mg Intravenous Given 12/26/21 1438)  iohexol (OMNIPAQUE) 350 MG/ML injection 100 mL (100 mLs Intravenous Contrast Given 12/26/21 1431)  ketorolac (TORADOL) 15 MG/ML injection 15 mg (15 mg Intravenous Given 12/26/21 1521)    ED Course/ Medical Decision Making/ A&P                           Medical Decision Making Patient here with positive blood cultures, consulted ID Dr. Thedore Mins on-call for vancomycin.  Patient with previous history of IV drug use, has been getting the dalvacin in office after his discharge from the hospital on Dec 12, 2021.  Air without any white count, afebrile, lactic of 2.2, given IV vancomycin.  Does endorse some pain with deep inspiration, CT angio without any sign of pulmonary embolism on today's visit.  Patient will need admission for further management.  Amount and/or Complexity of Data Reviewed Labs: ordered. Radiology: ordered.  Risk Prescription drug management.  Patient here with body aches, fever with a Tmax of 101, chills, prior history of bacteremia hospitalized from the month of April through May.  Seen by infectious disease 2 days ago and had blood cultures drawn, these were positive for gram-positive cocci isolated.  He was referred to the ED for further treatment.  Patient arrived to the ED with stable vital signs, no tachycardia, no hypoxia or hypertension.  He does endorse some pain with deep inspiration.  Lungs are diminished to auscultation to bilateral bases.  No pain along the chest, abdomen is soft nontender to palpation.  Moves upper and lower  extremities.  1:18 PM spoke to Dr. Thedore Mins of infectious disease who recommended starting patient on IV vancomycin.  And will be followed through in the hospital course.  Did discuss with her patient having some difficulty with deep inspiration endorsing pain to the posterior aspect.  Although not tachycardic or hypoxic I do feel that with a prior history of septic emboli will need to obtain CT angio to rule out acute embolism.  CT Angio showed: 1. Cavitary nodule with fluid and gas in the RIGHT lower lobe is  concerning for pulmonary infection.  2. Additional scattered pulmonary nodules in  the LEFT and RIGHT lung  are concerning for early pulmonary infections.  3. Similar pattern of pulmonary nodules in the lower lobes on CT  abdomen 11/11/2021.  4. No pulmonary embolism.  Heart appears normal.         I discussed results with patient at length.  I did discuss with him further admission into the hospital, call placed for hospitalist for admission due to bacteremia.  Spoke to Dr. Artis FlockWolfe who will admit patient for further evaluation.    Portions of this note were generated with Scientist, clinical (histocompatibility and immunogenetics)Dragon dictation software. Dictation errors may occur despite best attempts at proofreading.   Final Clinical Impression(s) / ED Diagnoses Final diagnoses:  Nausea  Bacteremia    Rx / DC Orders ED Discharge Orders     None         Claude MangesSoto, Keilon Ressel, PA-C 12/26/21 1603    Cathren LaineSteinl, Kevin, MD 12/27/21 [id number]

## 2021-12-26 NOTE — H&P (Signed)
History and Physical    Patient: Reginald Clarke WUJ:[id number] DOB: [birth year] DOA: 12/26/2021 DOS: the patient was seen and examined on 12/26/2021 PCP: Pcp, No  Patient coming from: Home - lives with grandmother    Chief Complaint: fevers/abnormal blood culture   HPI: Reginald Clarke is a 31 y.o. male with medical history significant of IV drug use, GERD, chronic pain (follow-up as an outpatient for methadone clinic) and recent hospitalization in April of 2023 for MSSA bacteremia with tricuspid valve endocarditis with severe TR and septic pulmonary emboli. Had angio VAC on 11/28/21. Placed on cefazolin until 5/10. Subsequently given oritavancin IV on day of discharge on 12/12/21 who presented after being called with abnormal blood culture results with gram positive cocci and instructed to return to the emergency department for further evaluation and management.  He states he has had fever since Saturday. Highest recorded was 103. He has been taking tylenol. He states he has had mild headaches and his back hurting. His back pain is exactly like his pain in April. He has no URI type symptoms, stomach pain, N/V/D, dysuria. Denies any shortness of breath or coughing. States his anxiety is really bad and asking for xanax that was given to him on hospital discharge.   He was seen by ID on 5/22 and received one dose of IV dalbavancin, next dose due on 5/30 and 6/6. He did go to ED on 5/20 and had temperature of 101 and left before he was seen as he felt okay.    He has been feeling good. Denies any vision changes, chest pain or palpitations, shortness of breath or cough, abdominal pain, N/V/D, dysuria or leg swelling.    He does smoke 1ppd and doesn't drink alcohol. No drug use since hospitalized.   ER Course:  vitals: afebrile, bp: 111/67, HR: 84, RR: 16, oxygen: 100%RA Pertinent labs: hgb: 11.1, lactic acid: 2.2>1.4,  BC from 5/22: gram positive cocci isolated  CXR: small ill-defined opacity within the  right lung base, similar to the prior chest radiographs of 12/22/21.  CTA chest: NO PE 1. Cavitary nodule with fluid and gas in the RIGHT lower lobe is concerning for pulmonary infection. 2. Additional scattered pulmonary nodules in the LEFT and RIGHT lung are concerning for early pulmonary infections. In ED: ID consulted, recommended vanc for bacteremia. BC obtained.    Review of Systems: As mentioned in the history of present illness. All other systems reviewed and are negative. Past Medical History:  Diagnosis Date   Hepatitis    hx of hep C per pt   Past Surgical History:  Procedure Laterality Date   APPLICATION OF ANGIOVAC N/A 11/28/2021   Procedure: APPLICATION OF ANGIOVAC;  Surgeon: Corliss Skains, MD;  Location: [facility] OR;  Service: Vascular;  Laterality: N/A;   TEE WITHOUT CARDIOVERSION N/A 11/23/2021   Procedure: TRANSESOPHAGEAL ECHOCARDIOGRAM (TEE);  Surgeon: Christell Constant, MD;  Location: [facility] ENDOSCOPY;  Service: Cardiovascular;  Laterality: N/A;   TEE WITHOUT CARDIOVERSION N/A 11/28/2021   Procedure: TRANSESOPHAGEAL ECHOCARDIOGRAM (TEE);  Surgeon: Corliss Skains, MD;  Location: [facility] OR;  Service: Open Heart Surgery;  Laterality: N/A;   tubes in ears     WRIST FRACTURE SURGERY     Social History:  reports that he has been smoking cigarettes. He has never used smokeless tobacco. He reports that he does not drink alcohol and does not use drugs.  Allergies  Allergen Reactions   Penicillins Hives   Amoxil [Amoxicillin Trihydrate]  History reviewed. No pertinent family history.  Prior to Admission medications   Medication Sig Start Date End Date Taking? Authorizing Provider  ALPRAZolam (XANAX) 0.25 MG tablet Take 1 tablet (0.25 mg total) by mouth 2 (two) times daily as needed for anxiety. Patient not taking: Reported on 12/24/2021 12/12/21   Osvaldo Shipper, MD  methadone (DOLOPHINE) 1 MG/1ML solution Take 135 mg by mouth daily. 04/24/20   [provider]  methocarbamol (ROBAXIN) 500 MG tablet Take 1 tablet (500 mg total) by mouth every 8 (eight) hours as needed for muscle spasms. 12/12/21   Osvaldo Shipper, MD  pantoprazole (PROTONIX) 40 MG tablet Take 1 tablet (40 mg total) by mouth daily. 12/13/21   Osvaldo Shipper, MD    Physical Exam: Vitals:   12/26/21 1530 12/26/21 1545 12/26/21 1600 12/26/21 1645  BP: 123/78 121/78 116/72 124/64  Pulse: 98 98 91 (!) 105  Resp: (!) 22 20 20 16   Temp:      TempSrc:      SpO2: 100% 98% 98% 100%  Weight:      Height:       General:  Appears calm and comfortable and is in NAD Eyes:  PERRL, EOMI, normal lids, iris ENT:  grossly normal hearing, lips & tongue, mmm; poor dentition Neck:  no LAD, masses or thyromegaly; no carotid bruits Cardiovascular:  RRR, no m/r/g. No LE edema.  Respiratory:   CTA bilaterally with no wheezes/rales/rhonchi.  Normal respiratory effort. No reproducible pain.  Abdomen:  soft, NT, ND, NABS Back:   normal alignment, no CVAT Skin:  no rash or induration seen on limited exam Musculoskeletal:  grossly normal tone BUE/BLE, good ROM, no bony abnormality Lower extremity:  No LE edema.  Limited foot exam with no ulcerations.  2+ distal pulses. Psychiatric:  grossly normal mood and affect, speech fluent and appropriate, AOx3 Neurologic:  CN 2-12 grossly intact, moves all extremities in coordinated fashion, sensation intact   Radiological Exams on Admission: Independently reviewed - see discussion in A/P where applicable  DG Chest 2 View  Result Date: 12/26/2021 CLINICAL DATA:  Provided history: Shortness of breath. EXAM: CHEST - 2 VIEW COMPARISON:  Chest radiographs 12/22/2021 and earlier FINDINGS: Heart size within normal limits. Small ill-defined opacity within the right lung base, similar to the prior chest radiograph 12/22/2021. No appreciable airspace consolidation within the left lung. No sizable pleural effusion. No evidence of pneumothorax. No acute bony  abnormality identified. IMPRESSION: Small ill-defined opacity within the right lung base, similar to the prior chest radiographs of 12/22/2021. This may reflect scarring or incompletely resolved right lower lobe pneumonia. Electronically Signed   By: 12/24/2021 D.O.   On: 12/26/2021 12:41   CT Angio Chest PE W and/or Wo Contrast  Result Date: 12/26/2021 CLINICAL DATA:  Pt arrived POV from home c/o back pin and nausea x1 week. Pt states he had blood cultures drawn through infectious disease and they called stating they were positive and to come here. EXAM: CT ANGIOGRAPHY CHEST WITH CONTRAST TECHNIQUE: Multidetector CT imaging of the chest was performed using the standard protocol during bolus administration of intravenous contrast. Multiplanar CT image reconstructions and MIPs were obtained to evaluate the vascular anatomy. RADIATION DOSE REDUCTION: This exam was performed according to the departmental dose-optimization program which includes automated exposure control, adjustment of the mA and/or kV according to patient size and/or use of iterative reconstruction technique. CONTRAST:  12/28/2021 OMNIPAQUE IOHEXOL 350 MG/ML SOLN COMPARISON:  None Available. FINDINGS: Cardiovascular: No filling  defects within the pulmonary arteries to suggest acute pulmonary embolism. Heart appears normal. Mediastinum/Nodes: No axillary or supraclavicular adenopathy. No mediastinal or hilar adenopathy. No pericardial fluid. Esophagus normal. Lungs/Pleura: Cavitary nodule with gas and fluid at the RIGHT lung base measures 2.3 x 1.5 cm (105/7). Solid nodule in the RIGHT lower lobe measures 6 mm on image 101/7). Semi-solid nodule in the LEFT lower lobe measures 7 mm on image 7/7. Solid nodule along the RIGHT horizontal fissure measures 7 mm image 62. Solid nodule in LEFT upper lobe measures 3 mm on image 29/7. Smaller additional peripheral subpleural nodules in the RIGHT lung on image 74/7 Upper Abdomen: Limited view of the liver,  kidneys, pancreas are unremarkable. Normal adrenal glands. Musculoskeletal: No aggressive osseous lesion. Review of the MIP images confirms the above findings. IMPRESSION: 1. Cavitary nodule with fluid and gas in the RIGHT lower lobe is concerning for pulmonary infection. 2. Additional scattered pulmonary nodules in the LEFT and RIGHT lung are concerning for early pulmonary infections. 3. Similar pattern of pulmonary nodules in the lower lobes on CT abdomen 11/11/2021. 4. No pulmonary embolism.  Heart appears normal. Electronically Signed   By: Genevive BiStewart  Edmunds M.D.   On: 12/26/2021 14:56    EKG: Independently reviewed.  Sinus tachycardia with rate 102; nonspecific ST changes with no evidence of acute ischemia Prolonged qt    Labs on Admission: I have personally reviewed the available labs and imaging studies at the time of the admission.  Pertinent labs:   hgb: 11.1,  lactic acid: 2.2>1.4,   Assessment and Plan: Principal Problem:   Bacteremia with gram positive cocci  Active Problems:   Endocarditis of tricuspid valve   Septic pulmonary embolism ([name])   Chronic pain syndrome/methadone abuse    Prolonged QT interval   Anxiety   Hepatitis C, chronic ([name])   GERD (gastroesophageal reflux disease)   Normocytic anemia   Polysubstance abuse ([name])   Tobacco abuse    Assessment and Plan: * Bacteremia with gram positive cocci  31 year old with history of IV drug abuse who was hospitalized in April of 2023 through May with MSSA bacteremia with tricuspid valve endocarditis, severe TR and septic pulmonary emboli who presented to ED with fevers and +BC from ID clinic growing gram positive cocci -admit to telemetry  -ID consulted and recommended IV vancomycin. No echo at this time or other antibiotics -repeat blood cultures pending -no leukocytosis, repeat lactic acid wnl and no criteria for sepsis. He has been afebrile while he has been in ED  -CTA of chest does show cavitary nodule with  fluid/gas in right lower lube as well as scattered pulmonary  Nodules in left and right lung, consistent with known septic pulmonary emboli. Discussed with ID, continue IV vancomycin for now.   Endocarditis of tricuspid valve s/p R femoral vein cannulation, R IJ vein cannulation, R heart cannulation, and debridement of R atrial mass and tricuspid valve vegetation on 4/26 with CT surgery -> good results with debridement, residual subvalvular component of vegetation which was not accessible, likely part of torn chordae. -Completed cefazolin through 5/10 -Now on weekly IV dalbavancin, received dose on 5/22, managed by ID  -now on IV vancomycin, ID consulted and following. Did not recommend repeat echo/imaging at this time   Septic pulmonary embolism ([name]) Likely has more emboli with back hurting and since were unable to remove all of vegetation. Read as pulmonary infection. No wbc, cough, shortness of breath. Continue IV vanc only for now,  discussed with ID.     Prolonged QT interval Check magnesium Will repeat ekg in AM Watch on telemetry On methadone, will need to monitor   Chronic pain syndrome/methadone abuse  Continue methadone   Anxiety Asking for xanax multiple times Discussed proper treatment would be to start a daily agent, SSRI then only use BZD or other meds PRN, also discussed would not use xanax with his history and recent drug use, we are setting him up to fail Declined, SSRI Will do klonopin BID prn instead of xanax, ideally would prefer he use hydroxyzine or buspar   Hepatitis C, chronic ([name]) Treatment outpatient Has discussed with ID outpatient   Normocytic anemia Check iron studies  trend  GERD (gastroesophageal reflux disease) Continue PPI   Polysubstance abuse ([name]) States he has not used meth since being in hospital in April UDS +THC  Tobacco abuse Smokes 1/2 PPD, declines nicotine patch     Advance Care Planning:   Code Status: Full Code    Consults: ID: Dr. Thedore Mins   DVT Prophylaxis: SCDs/ambulation   Family Communication: none   Severity of Illness: The appropriate patient status for this patient is INPATIENT. Inpatient status is judged to be reasonable and necessary in order to provide the required intensity of service to ensure the patient's safety. The patient's presenting symptoms, physical exam findings, and initial radiographic and laboratory data in the context of their chronic comorbidities is felt to place them at high risk for further clinical deterioration. Furthermore, it is not anticipated that the patient will be medically stable for discharge from the hospital within 2 midnights of admission.   * I certify that at the point of admission it is my clinical judgment that the patient will require inpatient hospital care spanning beyond 2 midnights from the point of admission due to high intensity of service, high risk for further deterioration and high frequency of surveillance required.*  Author: Orland Mustard, MD 12/26/2021 5:26 PM  For on call review [email or web address].

## 2021-12-26 NOTE — Assessment & Plan Note (Signed)
Treatment outpatient Has discussed with ID outpatient

## 2021-12-26 NOTE — Progress Notes (Signed)
Pharmacy Antibiotic Note  Reginald Clarke is a 31 y.o. male admitted on 12/26/2021 with bacteremia.  Pharmacy has been consulted for vancomycin dosing.  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   Underwent angiovac placement in OR 4/26 then 2 weeks ancef + 1 dose oritavancin on d/c (was supposed to get dose 5/17 but pushed to 5/22)  5/22 patient seen in ID clinic and was nauseous and had Bcx drawn  Plan: Vancomycin 1750 mg x1 then 1750 mg q12h (eAUC 530, Scr 0.8) F/u Bcx, vanc levels as appropriate, ability to de-escalate, and any plans for re-peat intervention  Height: 5\' 10"  (177.8 cm) Weight: 76.2 kg (168 lb) IBW/kg (Calculated) : 73  Temp (24hrs), Avg:98.9 F (37.2 C), Min:98.9 F (37.2 C), Max:98.9 F (37.2 C)  Recent Labs  Lab 12/22/21 2025 12/24/21 1427 12/26/21 1216  WBC 9.4 7.2 7.5  CREATININE 0.97  --  0.63  LATICACIDVEN  --   --  2.2*    Estimated Creatinine Clearance: 139.4 mL/min (by C-G formula based on SCr of 0.63 mg/dL).    Allergies  Allergen Reactions   Penicillins Hives   Amoxil [Amoxicillin Trihydrate]     Antimicrobials this admission: 5/24 vancomycin >>    Dose adjustments this admission: N/A  Microbiology results: 5/22 Bcx 4/4 gram positive cocci  Thank you for allowing pharmacy to be a part of this patient's care.  6/22 A Agnieszka Newhouse 12/26/2021 1:58 PM

## 2021-12-26 NOTE — ED Notes (Signed)
Patient transported to CT 

## 2021-12-26 NOTE — Assessment & Plan Note (Addendum)
Likely has more emboli with back hurting and since were unable to remove all of vegetation. Read as pulmonary infection. No wbc, cough, shortness of breath. Continue IV vanc only for now, discussed with ID.

## 2021-12-26 NOTE — ED Triage Notes (Signed)
Pt arrived POV from home c/o back pin and nausea x1 week. Pt states he had blood cultures drawn through infectious disease and they called stating they were positive and to come here.

## 2021-12-26 NOTE — ED Notes (Signed)
ED TO INPATIENT HANDOFF REPORT  ED Nurse Name and Phone #: Lenox Ponds Name/Age/Gender Reginald Clarke 31 y.o. male Room/Bed: 002C/002C  Code Status   Code Status: Prior  Home/SNF/Other Home Patient oriented to: self, place, time, and situation Is this baseline? Yes   Triage Complete: Triage complete  Chief Complaint Bacteremia [R78.81]  Triage Note Pt arrived POV from home c/o back pin and nausea x1 week. Pt states he had blood cultures drawn through infectious disease and they called stating they were positive and to come here.    Allergies Allergies  Allergen Reactions   Penicillins Hives   Amoxil [Amoxicillin Trihydrate]     Level of Care/Admitting Diagnosis ED Disposition     ED Disposition  Admit   Condition  --   Comment  Hospital Area: [facility] [[id number]]  Level of Care: Med-Surg [16]  May admit patient to [facility] or [facility] if equivalent level of care is available:: Yes  Covid Evaluation: Asymptomatic - no recent exposure (last 10 days) testing not required  Diagnosis: Bacteremia [790.7.ICD-9-CM]  Admitting Physician: Orland Mustard [[id number]]  Attending Physician: Orland Mustard [[id number]]  Estimated length of stay: past midnight tomorrow  Certification:: I certify this patient will need inpatient services for at least 2 midnights          B Medical/Surgery History Past Medical History:  Diagnosis Date   Hepatitis    hx of hep C per pt   Past Surgical History:  Procedure Laterality Date   APPLICATION OF ANGIOVAC N/A 11/28/2021   Procedure: APPLICATION OF ANGIOVAC;  Surgeon: Corliss Skains, MD;  Location: [facility] OR;  Service: Vascular;  Laterality: N/A;   TEE WITHOUT CARDIOVERSION N/A 11/23/2021   Procedure: TRANSESOPHAGEAL ECHOCARDIOGRAM (TEE);  Surgeon: Christell Constant, MD;  Location: [facility];  Service: Cardiovascular;  Laterality: N/A;   TEE WITHOUT CARDIOVERSION N/A 11/28/2021   Procedure: TRANSESOPHAGEAL  ECHOCARDIOGRAM (TEE);  Surgeon: Corliss Skains, MD;  Location: [facility] OR;  Service: Open Heart Surgery;  Laterality: N/A;   tubes in ears     WRIST FRACTURE SURGERY       A IV Location/Drains/Wounds Patient Lines/Drains/Airways Status     Active Line/Drains/Airways     Name Placement date Placement time Site Days   Peripheral IV 12/26/21 20 G Right Forearm 12/26/21  1215  Forearm  less than 1   Incision (Closed) 11/28/21 Neck Right 11/28/21  1349  -- 28   Incision (Closed) 11/28/21 Groin Right 11/28/21  1349  -- 28            Intake/Output Last 24 hours No intake or output data in the 24 hours ending 12/26/21 1610  Labs/Imaging Results for orders placed or performed during the hospital encounter of 12/26/21 (from the past 48 hour(s))  CBC with Differential     Status: Abnormal   Collection Time: 12/26/21 12:16 PM  Result Value Ref Range   WBC 7.5 4.0 - 10.5 K/uL   RBC 3.66 (L) 4.22 - 5.81 MIL/uL   Hemoglobin 11.1 (L) 13.0 - 17.0 g/dL   HCT 28.0 (L) 03.4 - 91.7 %   MCV 94.3 80.0 - 100.0 fL   MCH 30.3 26.0 - 34.0 pg   MCHC 32.2 30.0 - 36.0 g/dL   RDW 91.5 05.6 - 97.9 %   Platelets 144 (L) 150 - 400 K/uL   nRBC 0.0 0.0 - 0.2 %   Neutrophils Relative % 66 %   Neutro Abs 5.0 1.7 -  7.7 K/uL   Lymphocytes Relative 22 %   Lymphs Abs 1.6 0.7 - 4.0 K/uL   Monocytes Relative 10 %   Monocytes Absolute 0.7 0.1 - 1.0 K/uL   Eosinophils Relative 1 %   Eosinophils Absolute 0.1 0.0 - 0.5 K/uL   Basophils Relative 0 %   Basophils Absolute 0.0 0.0 - 0.1 K/uL   Immature Granulocytes 1 %   Abs Immature Granulocytes 0.04 0.00 - 0.07 K/uL    Comment: Performed at [facility], [address], [place], [place] [address]  Comprehensive metabolic panel     Status: Abnormal   Collection Time: 12/26/21 12:16 PM  Result Value Ref Range   Sodium 138 135 - 145 mmol/L   Potassium 3.9 3.5 - 5.1 mmol/L   Chloride 103 98 - 111 mmol/L   CO2 25 22 - 32 mmol/L   Glucose, Bld 88 70 - 99  mg/dL    Comment: Glucose reference range applies only to samples taken after fasting for at least 8 hours.   BUN <5 (L) 6 - 20 mg/dL   Creatinine, Ser 6.04 0.61 - 1.24 mg/dL   Calcium 8.8 (L) 8.9 - 10.3 mg/dL   Total Protein 7.3 6.5 - 8.1 g/dL   Albumin 3.1 (L) 3.5 - 5.0 g/dL   AST 28 15 - 41 U/L   ALT 29 0 - 44 U/L   Alkaline Phosphatase 64 38 - 126 U/L   Total Bilirubin 0.5 0.3 - 1.2 mg/dL   GFR, Estimated >54 >09 mL/min    Comment: (NOTE) Calculated using the CKD-EPI Creatinine Equation (2021)    Anion gap 10 5 - 15    Comment: Performed at [facility], [address] [place], [place] [address]  Lactic acid, plasma     Status: Abnormal   Collection Time: 12/26/21 12:16 PM  Result Value Ref Range   Lactic Acid, Venous 2.2 (HH) 0.5 - 1.9 mmol/L    Comment: CRITICAL RESULT CALLED TO, READ BACK BY AND VERIFIED WITH: C,YANG RN  12/26/21 E,BENTON Performed at [facility], [address], [place], [place] [address]   Sedimentation rate     Status: Abnormal   Collection Time: 12/26/21 12:16 PM  Result Value Ref Range   Sed Rate 49 (H) 0 - 16 mm/hr    Comment: Performed at [facility], [address], [name], [place] [address]  C-reactive protein     Status: Abnormal   Collection Time: 12/26/21 12:16 PM  Result Value Ref Range   CRP 12.1 (H) <1.0 mg/dL    Comment: Performed at [facility], [address], [place], [place] [address]  Blood culture (routine x 2)     Status: None (Preliminary result)   Collection Time: 12/26/21 12:17 PM   Specimen: BLOOD RIGHT FOREARM  Result Value Ref Range   Specimen Description BLOOD RIGHT FOREARM    Special Requests      AEROBIC BOTTLE ONLY Blood Culture results may not be optimal due to an inadequate volume of blood received in culture bottles   Culture      NO GROWTH <12 HOURS Performed at [facility], [address], [place], [place] [address]    Report Status PENDING   Lactic acid, plasma      Status: None   Collection Time: 12/26/21  2:12 PM  Result Value Ref Range   Lactic Acid, Venous 1.4 0.5 - 1.9 mmol/L  Comment: Performed at [facility], [address] [place], [place] [address]   DG Chest 2 View  Result Date: 12/26/2021 CLINICAL DATA:  Provided history: Shortness of breath. EXAM: CHEST - 2 VIEW COMPARISON:  Chest radiographs 12/22/2021 and earlier FINDINGS: Heart size within normal limits. Small ill-defined opacity within the right lung base, similar to the prior chest radiograph 12/22/2021. No appreciable airspace consolidation within the left lung. No sizable pleural effusion. No evidence of pneumothorax. No acute bony abnormality identified. IMPRESSION: Small ill-defined opacity within the right lung base, similar to the prior chest radiographs of 12/22/2021. This may reflect scarring or incompletely resolved right lower lobe pneumonia. Electronically Signed   By: Jackey LogeKyle  Golden D.O.   On: 12/26/2021 12:41   CT Angio Chest PE W and/or Wo Contrast  Result Date: 12/26/2021 CLINICAL DATA:  Pt arrived POV from home c/o back pin and nausea x1 week. Pt states he had blood cultures drawn through infectious disease and they called stating they were positive and to come here. EXAM: CT ANGIOGRAPHY CHEST WITH CONTRAST TECHNIQUE: Multidetector CT imaging of the chest was performed using the standard protocol during bolus administration of intravenous contrast. Multiplanar CT image reconstructions and MIPs were obtained to evaluate the vascular anatomy. RADIATION DOSE REDUCTION: This exam was performed according to the departmental dose-optimization program which includes automated exposure control, adjustment of the mA and/or kV according to patient size and/or use of iterative reconstruction technique. CONTRAST:  100mL OMNIPAQUE IOHEXOL 350 MG/ML SOLN COMPARISON:  None Available. FINDINGS: Cardiovascular: No filling defects within the pulmonary arteries to suggest acute pulmonary  embolism. Heart appears normal. Mediastinum/Nodes: No axillary or supraclavicular adenopathy. No mediastinal or hilar adenopathy. No pericardial fluid. Esophagus normal. Lungs/Pleura: Cavitary nodule with gas and fluid at the RIGHT lung base measures 2.3 x 1.5 cm (105/7). Solid nodule in the RIGHT lower lobe measures 6 mm on image 101/7). Semi-solid nodule in the LEFT lower lobe measures 7 mm on image 7/7. Solid nodule along the RIGHT horizontal fissure measures 7 mm image 62. Solid nodule in LEFT upper lobe measures 3 mm on image 29/7. Smaller additional peripheral subpleural nodules in the RIGHT lung on image 74/7 Upper Abdomen: Limited view of the liver, kidneys, pancreas are unremarkable. Normal adrenal glands. Musculoskeletal: No aggressive osseous lesion. Review of the MIP images confirms the above findings. IMPRESSION: 1. Cavitary nodule with fluid and gas in the RIGHT lower lobe is concerning for pulmonary infection. 2. Additional scattered pulmonary nodules in the LEFT and RIGHT lung are concerning for early pulmonary infections. 3. Similar pattern of pulmonary nodules in the lower lobes on CT abdomen 11/11/2021. 4. No pulmonary embolism.  Heart appears normal. Electronically Signed   By: Genevive BiStewart  Edmunds M.D.   On: 12/26/2021 14:56    Pending Labs Unresulted Labs (From admission, onward)     Start     Ordered   12/26/21 1604  Urinalysis, Routine w reflex microscopic  Once,   URGENT        12/26/21 1603   12/26/21 1604  Rapid urine drug screen (hospital performed)  ONCE - STAT,   STAT        12/26/21 1603   12/26/21 1157  Blood culture (routine x 2)  BLOOD CULTURE X 2,   R      12/26/21 1157            Vitals/Pain Today's Vitals   12/26/21 1520 12/26/21 1530 12/26/21 1545 12/26/21 1600  BP:  123/78 121/78  116/72  Pulse:  98 98 91  Resp:  (!) 22 20 20   Temp:      TempSrc:      SpO2:  100% 98% 98%  Weight:      Height:      PainSc: 8        Isolation Precautions No active  isolations  Medications Medications  vancomycin (VANCOREADY) IVPB 1750 mg/350 mL (1,750 mg Intravenous New Bag/Given 12/26/21 1441)  vancomycin (VANCOREADY) IVPB 1750 mg/350 mL (has no administration in time range)  LORazepam (ATIVAN) injection 1 mg (1 mg Intravenous Given 12/26/21 1438)  iohexol (OMNIPAQUE) 350 MG/ML injection 100 mL (100 mLs Intravenous Contrast Given 12/26/21 1431)  ketorolac (TORADOL) 15 MG/ML injection 15 mg (15 mg Intravenous Given 12/26/21 1521)    Mobility walks Low fall risk   Focused Assessments Pulmonary Assessment Handoff:  Lung sounds: Diminished  O2 Device: Room Air      R Recommendations: See Admitting Provider Note  Report given to:   Additional Notes:

## 2021-12-26 NOTE — Progress Notes (Signed)
I attempted to call patient *2 for positive blood culture reports and need to go to the hospital/ Phone was not received and voice mail is full. Will have office staff to attempt again.

## 2021-12-26 NOTE — Assessment & Plan Note (Signed)
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 -Completed cefazolin through 5/10 -Now on weekly IV dalbavancin, received dose on 5/22, managed by ID  -now on IV vancomycin, ID consulted and following. Did not recommend repeat echo/imaging at this time

## 2021-12-26 NOTE — Telephone Encounter (Signed)
Called patient to relay results and instruct him to go to the emergency department to be admitted for sepsis per Dr. Elinor Parkinson. No answer and voicemail box full.   Sandie Ano, RN

## 2021-12-26 NOTE — Assessment & Plan Note (Signed)
Asking for xanax multiple times Discussed proper treatment would be to start a daily agent, SSRI then only use BZD or other meds PRN, also discussed would not use xanax with his history and recent drug use, we are setting him up to fail Declined, SSRI Will do klonopin BID prn instead of xanax, ideally would prefer he use hydroxyzine or buspar

## 2021-12-26 NOTE — Telephone Encounter (Signed)
Spoke to patient and relayed message that blood cultures were positive and to go to the emergency department to be admitted to the hospital.  Patient stated understanding and that he would be able to go to ED this morning. All questions answered.  Wyvonne Lenz, RN

## 2021-12-27 ENCOUNTER — Inpatient Hospital Stay (HOSPITAL_COMMUNITY): Payer: BLUE CROSS/BLUE SHIELD

## 2021-12-27 DIAGNOSIS — F419 Anxiety disorder, unspecified: Secondary | ICD-10-CM

## 2021-12-27 DIAGNOSIS — I079 Rheumatic tricuspid valve disease, unspecified: Secondary | ICD-10-CM

## 2021-12-27 DIAGNOSIS — G894 Chronic pain syndrome: Secondary | ICD-10-CM

## 2021-12-27 DIAGNOSIS — R7881 Bacteremia: Secondary | ICD-10-CM | POA: Diagnosis not present

## 2021-12-27 LAB — CBC
HCT: 29.9 % — ABNORMAL LOW (ref 39.0–52.0)
Hemoglobin: 9.5 g/dL — ABNORMAL LOW (ref 13.0–17.0)
MCH: 29.5 pg (ref 26.0–34.0)
MCHC: 31.8 g/dL (ref 30.0–36.0)
MCV: 92.9 fL (ref 80.0–100.0)
Platelets: 152 10*3/uL (ref 150–400)
RBC: 3.22 MIL/uL — ABNORMAL LOW (ref 4.22–5.81)
RDW: 14.1 % (ref 11.5–15.5)
WBC: 6.5 10*3/uL (ref 4.0–10.5)
nRBC: 0 % (ref 0.0–0.2)

## 2021-12-27 LAB — ECHOCARDIOGRAM COMPLETE BUBBLE STUDY
AV Mean grad: 6 mmHg
AV Peak grad: 11 mmHg
Ao pk vel: 1.66 m/s
Area-P 1/2: 4.49 cm2
S' Lateral: 3 cm

## 2021-12-27 LAB — BASIC METABOLIC PANEL
Anion gap: 7 (ref 5–15)
BUN: 5 mg/dL — ABNORMAL LOW (ref 6–20)
CO2: 26 mmol/L (ref 22–32)
Calcium: 8.2 mg/dL — ABNORMAL LOW (ref 8.9–10.3)
Chloride: 102 mmol/L (ref 98–111)
Creatinine, Ser: 0.69 mg/dL (ref 0.61–1.24)
GFR, Estimated: >60 mL/min (ref >60)
Glucose, Bld: 90 mg/dL (ref 70–99)
Potassium: 3.6 mmol/L (ref 3.5–5.1)
Sodium: 135 mmol/L (ref 135–145)

## 2021-12-27 IMAGING — DX DG ORTHOPANTOGRAM /PANORAMIC
1 series · 1 of 1 positions shown · non-contrast
Comparison: None Available.

CLINICAL DATA: Bacteremia

EXAM:
ORTHOPANTOGRAM/PANORAMIC

[view not recorded]
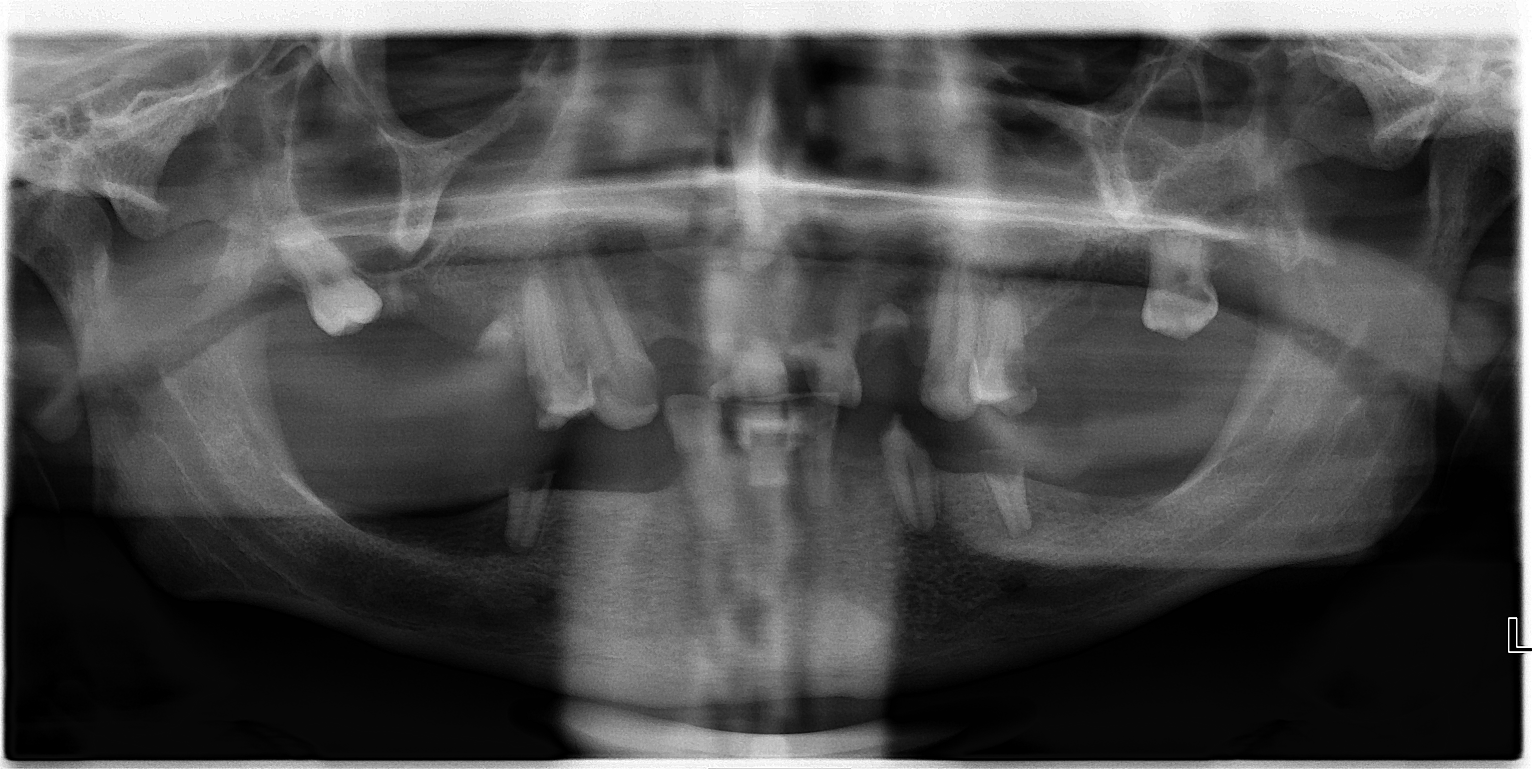

[1 of 1 positions shown; findings below may reference images not displayed]

FINDINGS: Multiple missing mandibular and maxillary teeth. Multiple remaining
teeth demonstrate dental caries. No visible periapical abscess. No
acute bony abnormality.
IMPRESSION: Multiple missing teeth with multiple dental caries within the
remaining teeth. No periapical abscess.

## 2021-12-27 IMAGING — MR MR THORACIC SPINE WO/W CM
4 of 9 series · 19 of 48 positions shown · IV contrast (gadavist)
Comparison: [DATE]

CLINICAL DATA: Low back pain with infection suspected. Positive
x-ray/CT

EXAM:
MRI THORACIC AND LUMBAR SPINE WITHOUT AND WITH CONTRAST
TECHNIQUE: Multiplanar and multiecho pulse sequences of the thoracic and lumbar
spine were obtained without and with intravenous contrast.
CONTRAST:  7mL GADAVIST GADOBUTROL 1 MMOL/ML IV SOLN

[Series 4: T2 · sagittal · 3.0mm · 0.62mm/px · 3 of 14 slices shown (1 of 2)]
[im 1/14]
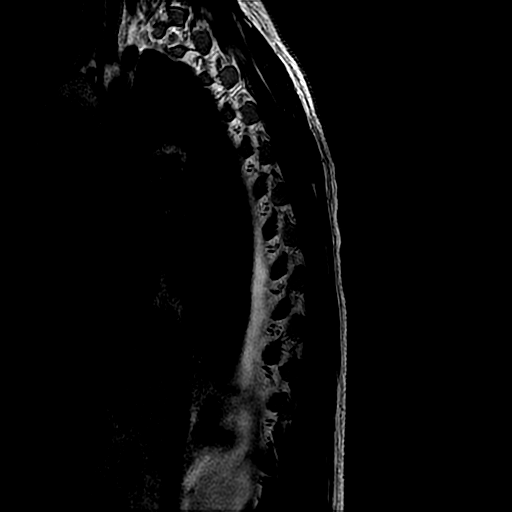
[im 7/14]
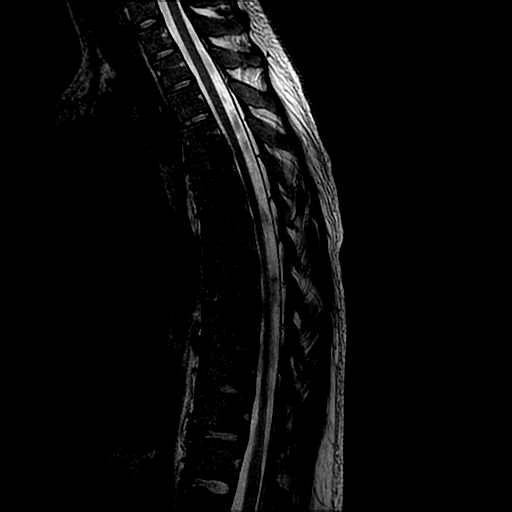
[im 14/14]
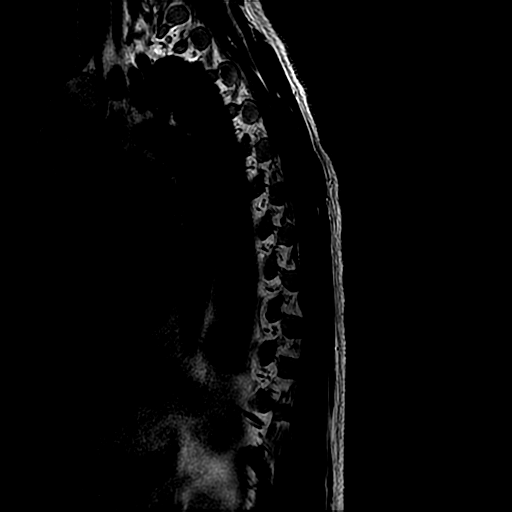

[Series 6: T1 · sagittal · 3.0mm · 0.62mm/px · 3 of 14 slices shown (1 of 2)]
[im 1/14]
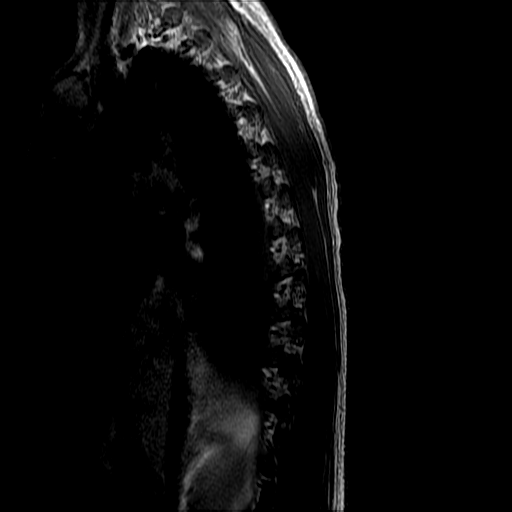
[im 7/14]
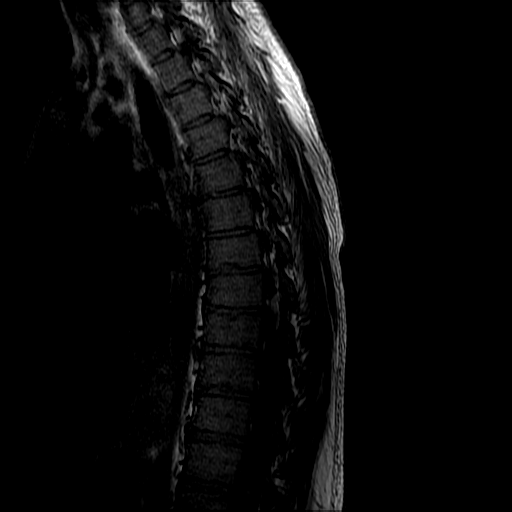
[im 14/14]
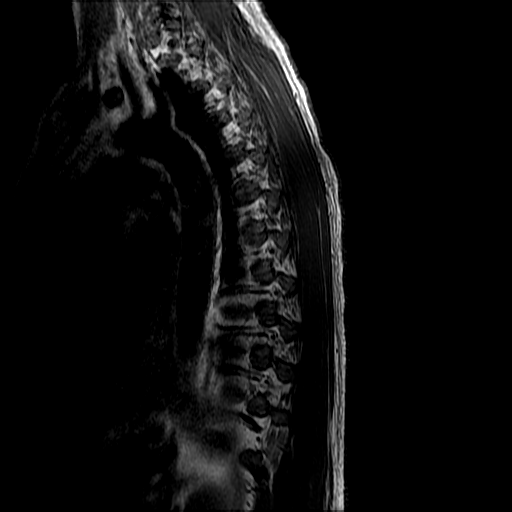

[Series 7: T2 · axial · 4.0mm · 0.43mm/px · z∈[-282,-47]mm · 9 of 36 slices shown (2 of 2)]
[im 1/36]
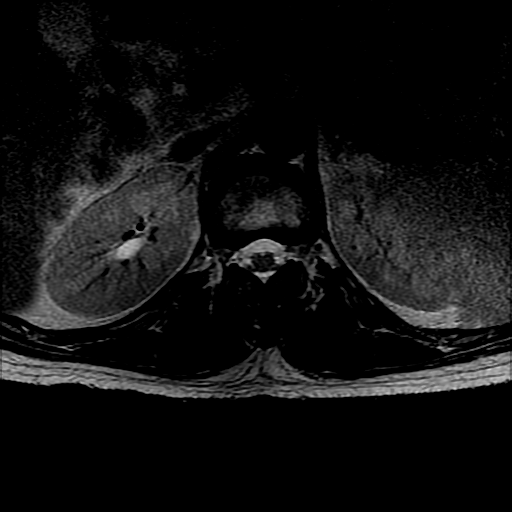
[im 5/36]
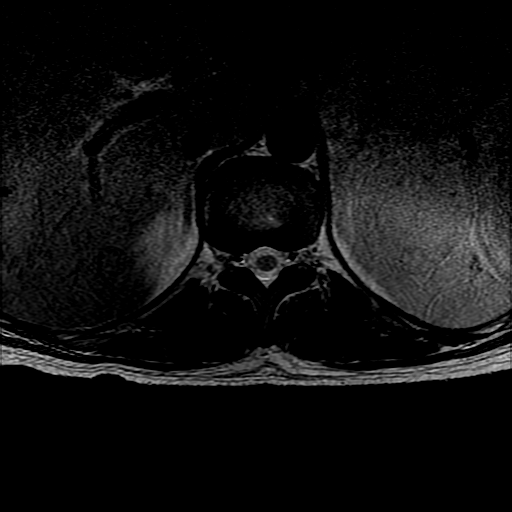
[im 9/36]
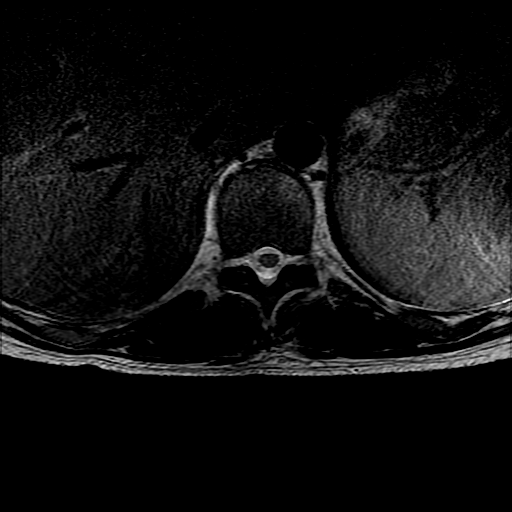
[im 14/36]
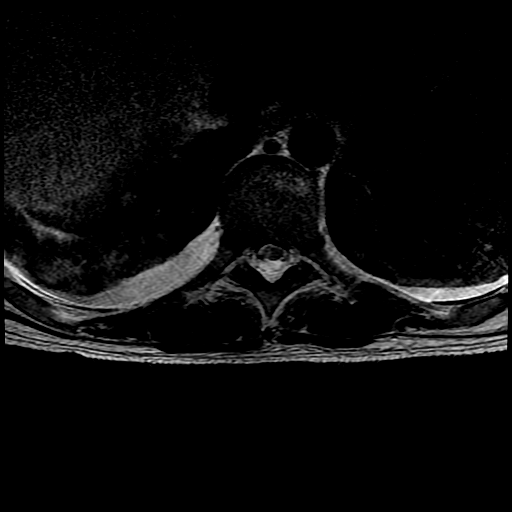
[im 18/36]
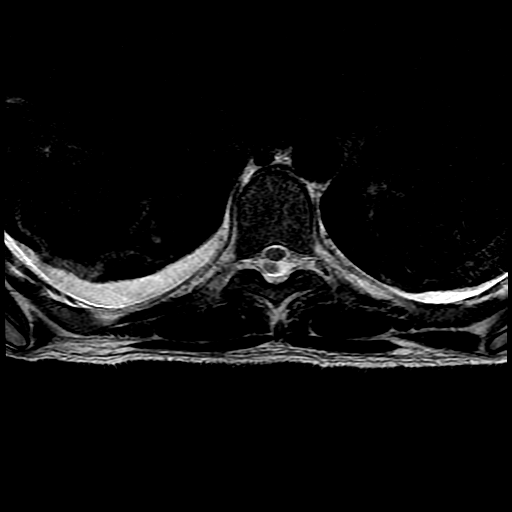
[im 22/36]
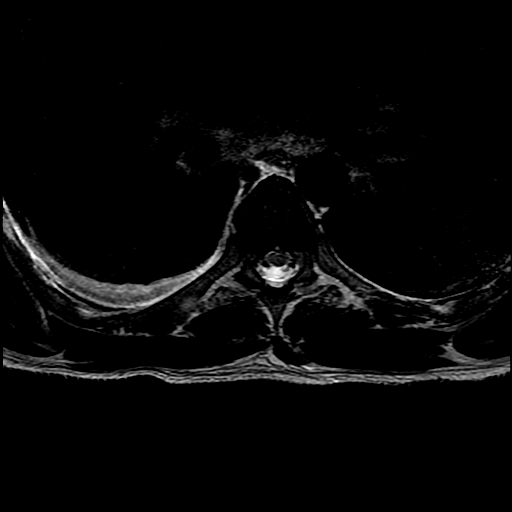
[im 27/36]
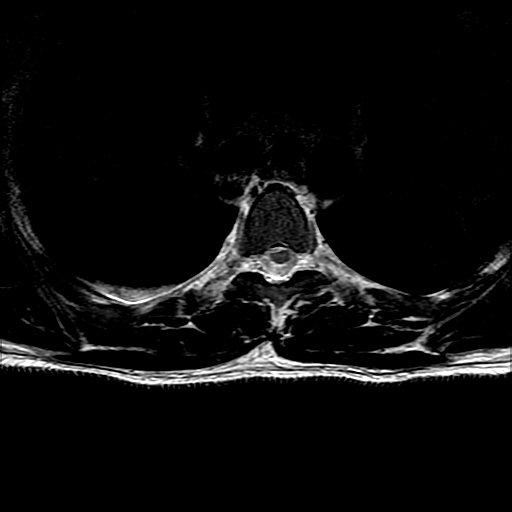
[im 31/36]
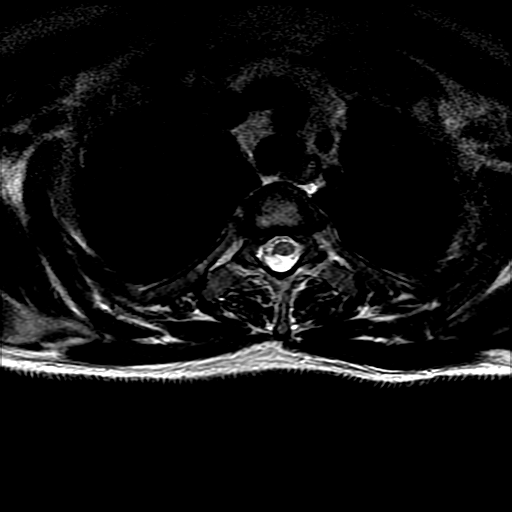
[im 36/36]
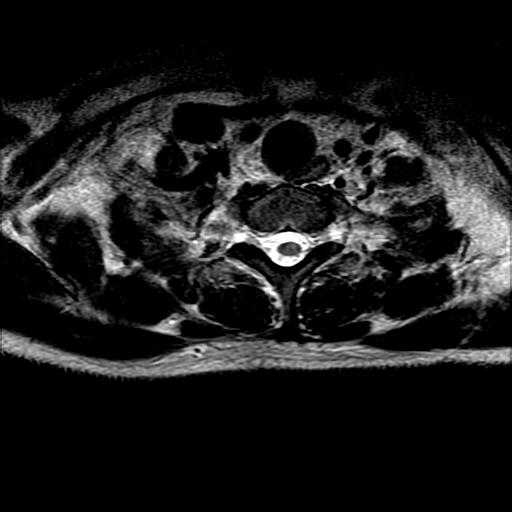

[Series 10: T1 · axial · non-contrast · 4.0mm · 0.43mm/px · z∈[-282,-75]mm · 4 of 36 slices shown (2 of 2)]
[im 1/36]
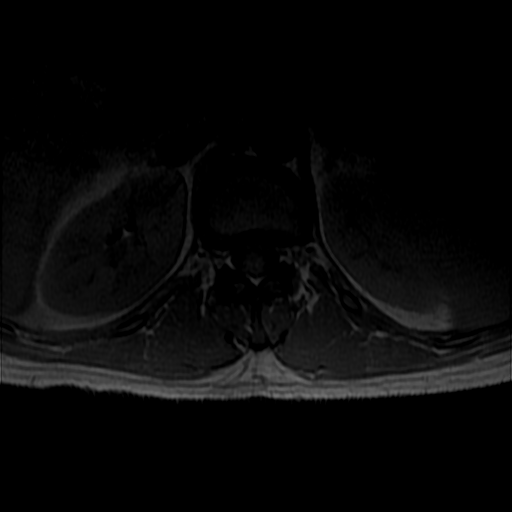
[im 5/36]
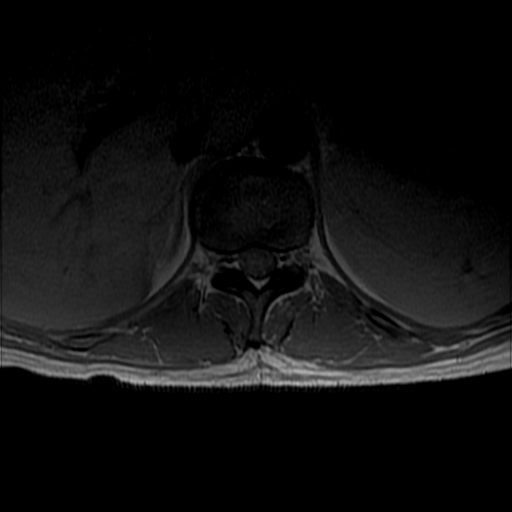
[im 18/36]
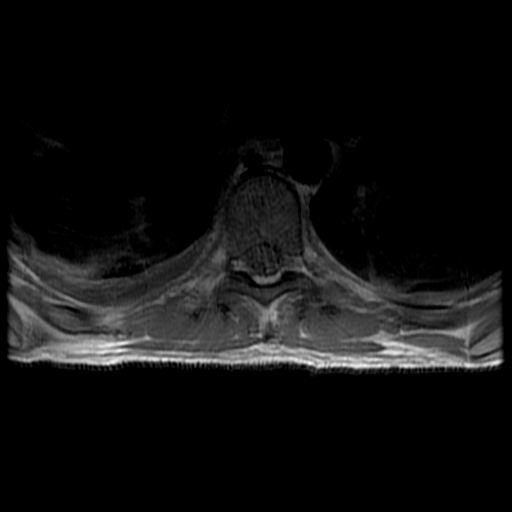
[im 31/36]
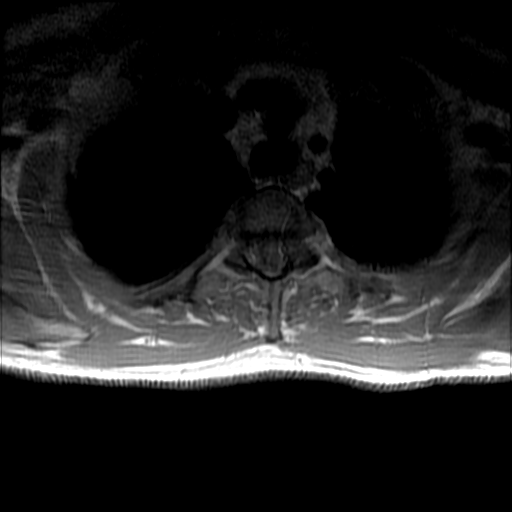

[19 of 48 positions shown; findings below may reference images not displayed]

FINDINGS: MRI THORACIC SPINE FINDINGS

Alignment:  Physiologic.

Vertebrae: Mild marrow edema leftward at the level of the T10-11
endplate with mild adjacent paravertebral edema/enhancement. No disc
collapse and only minimal associated disc T2 hyperintensity.

Cord: Normal signal and morphology. No evidence of spinal canal
collection.

Paraspinal and other soft tissues: Trace bilateral pleural effusion.
Nodular opacities in the bilateral lungs, recently evaluated by CT.

Disc levels:

Small disc protrusions at T5-6, T6-7, and T8-9. Negative facets. No
neural impingement.

MRI LUMBAR SPINE FINDINGS

Segmentation:  Standard.

Alignment:  Physiologic.

Vertebrae:  No fracture, evidence of discitis, or bone lesion.

Conus medullaris: Extends to the L1-2 level and appears normal.

Paraspinal and other soft tissues: Negative for perispinal mass or
inflammation

Disc levels:

Diffusely preserved disc height and hydration. No facet spurring. No
neural impingement.
IMPRESSION: 1. Minimal marrow edema about the left aspect of the T10-11
endplates, early spinal infection is possible given absence on
comparison from last month. No spinal canal collection.
2. Negative lumbar spine.

## 2021-12-27 IMAGING — MR MR LUMBAR SPINE WO/W CM
4 of 7 series · 19 of 48 positions shown · IV contrast (gadavist)
Comparison: [DATE]

CLINICAL DATA: Low back pain with infection suspected. Positive
x-ray/CT

EXAM:
MRI THORACIC AND LUMBAR SPINE WITHOUT AND WITH CONTRAST
TECHNIQUE: Multiplanar and multiecho pulse sequences of the thoracic and lumbar
spine were obtained without and with intravenous contrast.
CONTRAST:  7mL GADAVIST GADOBUTROL 1 MMOL/ML IV SOLN

[Series 12: T2 · sagittal · 4.0mm · 0.55mm/px · 4 of 14 slices shown (1 of 2)]
[im 1/14]
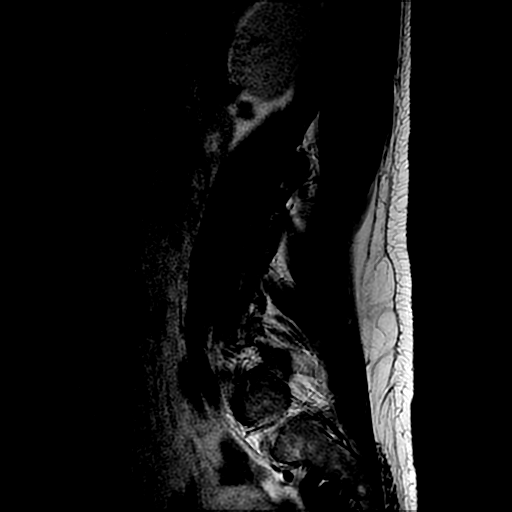
[im 5/14]
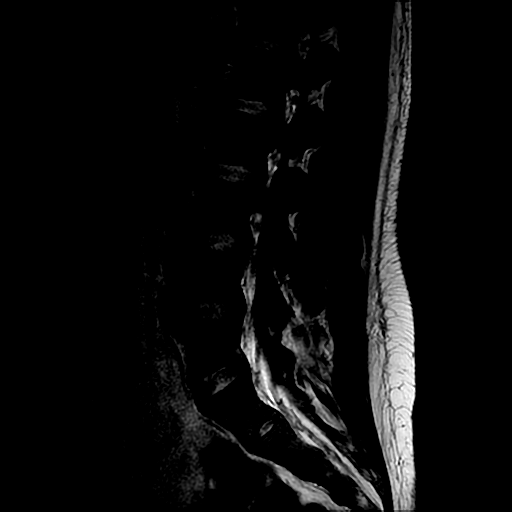
[im 9/14]
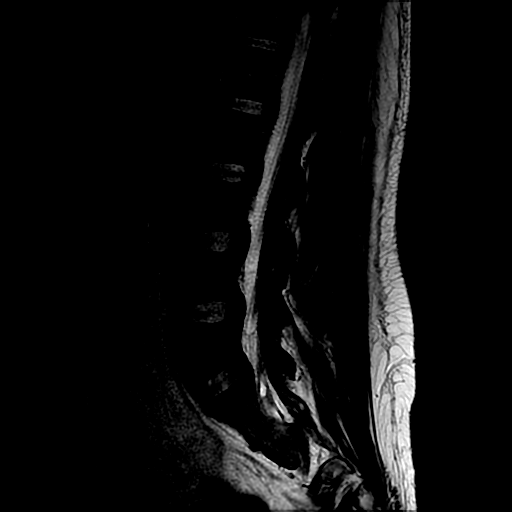
[im 14/14]
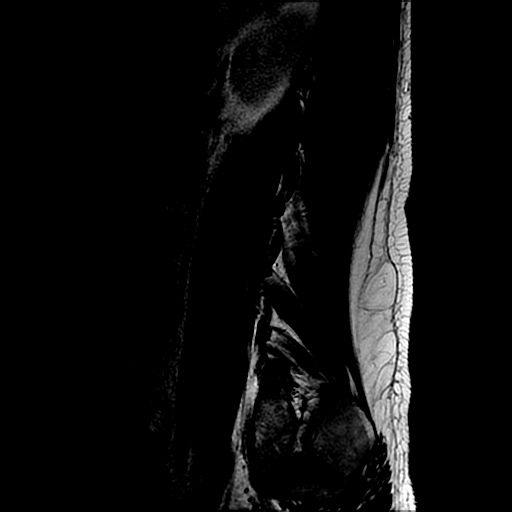

[Series 14: T1 · sagittal · 4.0mm · 0.55mm/px · 3 of 14 slices shown (1 of 2)]
[im 1/14]
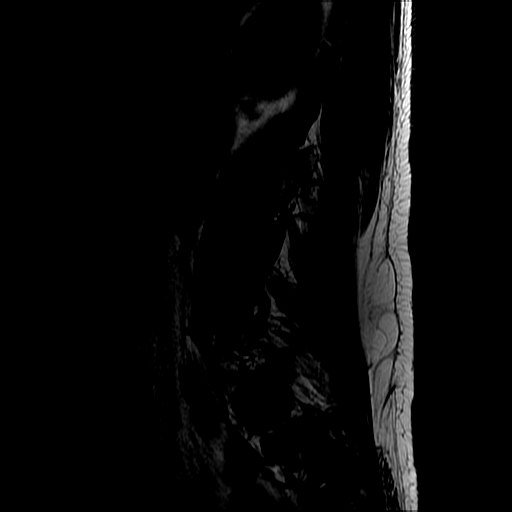
[im 9/14]
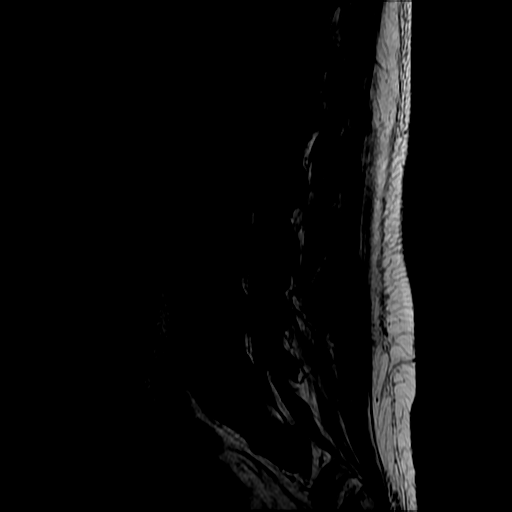
[im 14/14]
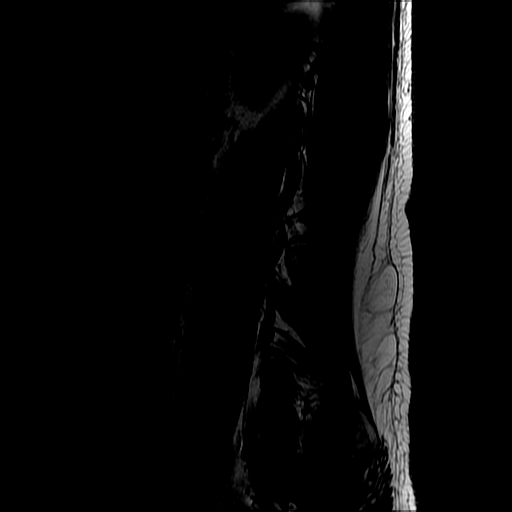

[Series 15: T2 · axial · 4.0mm · 0.39mm/px · z∈[-551,-323]mm · 9 of 46 slices shown (2 of 2)]
[im 1/46]
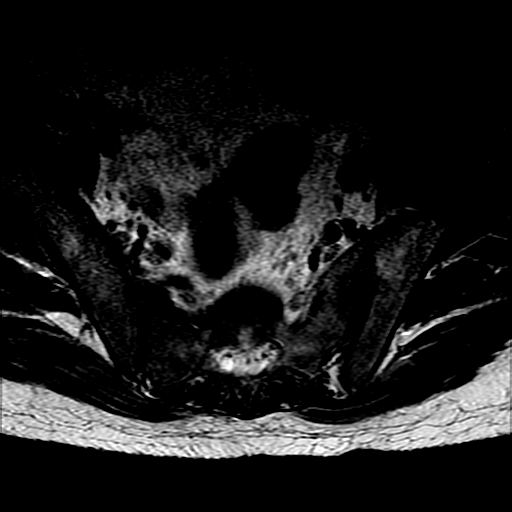
[im 5/46]
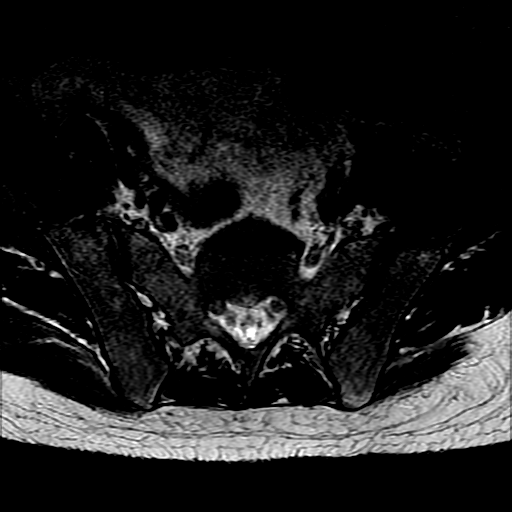
[im 10/46]
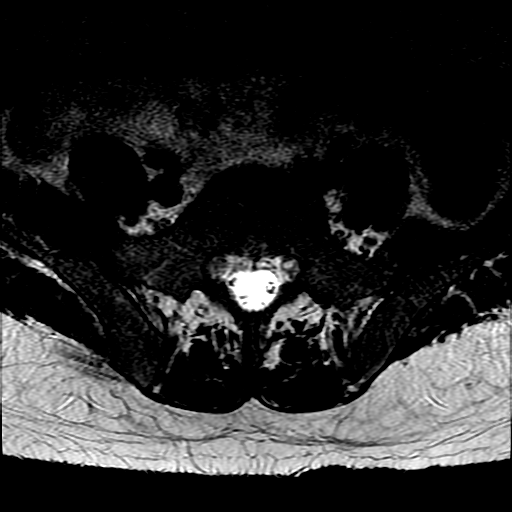
[im 14/46]
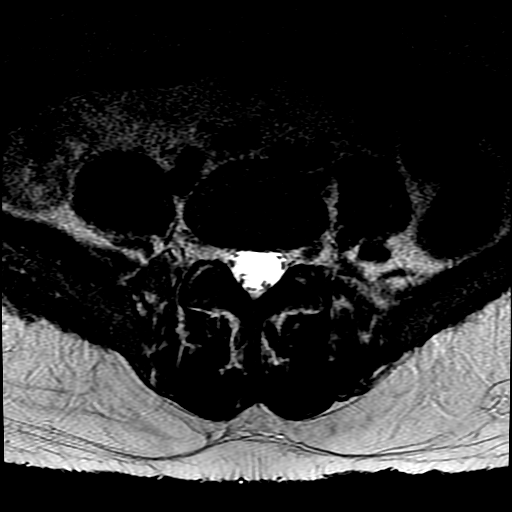
[im 19/46]
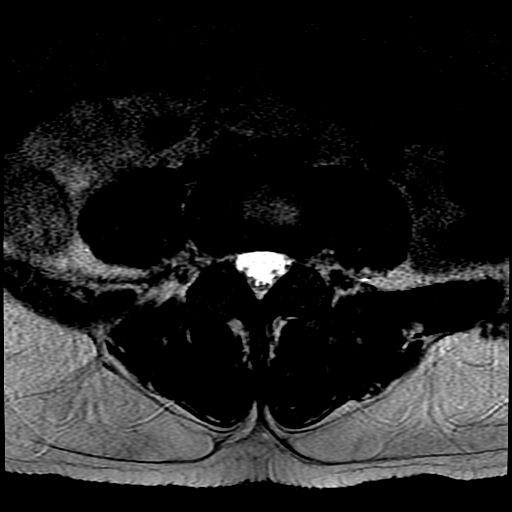
[im 23/46]
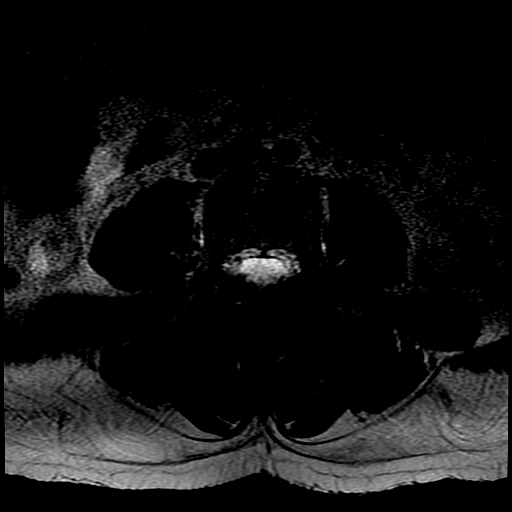
[im 28/46]
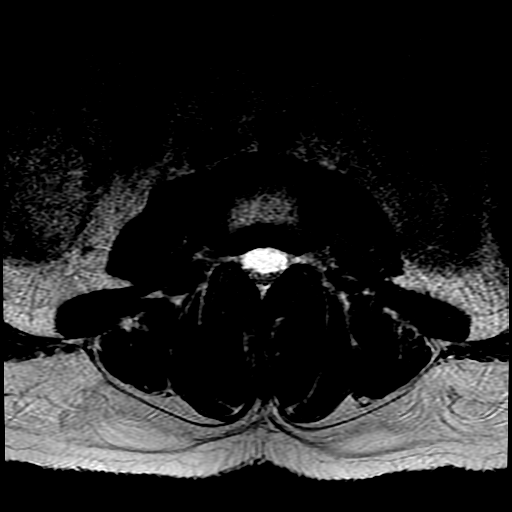
[im 32/46]
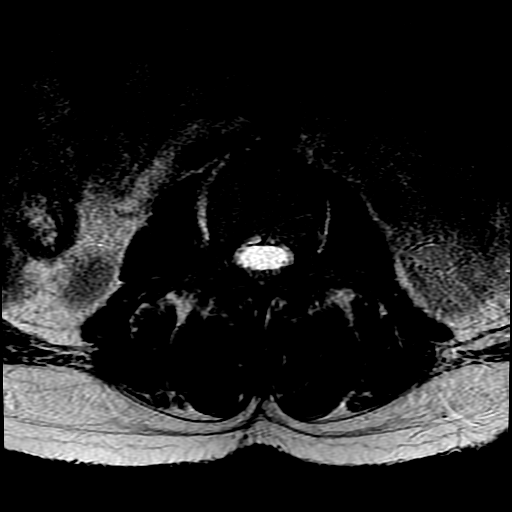
[im 41/46]
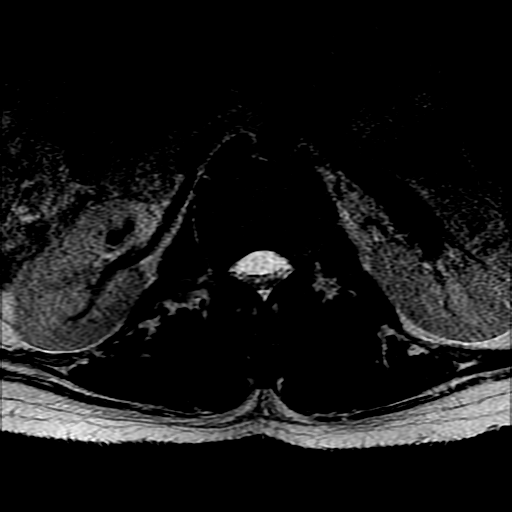

[Series 16: T1 · axial · 4.0mm · 0.39mm/px · z∈[-531,-323]mm · 3 of 46 slices shown (2 of 2)]
[im 5/46]
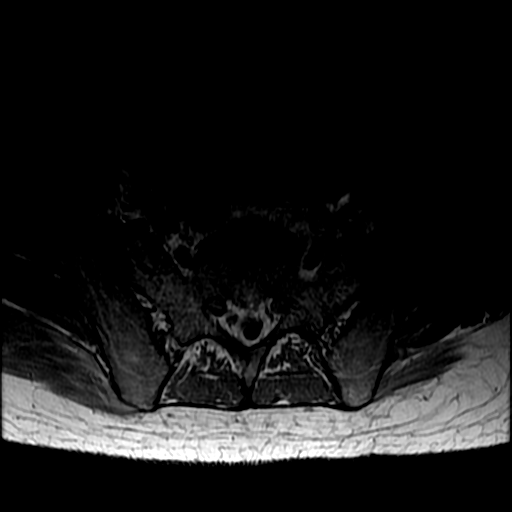
[im 23/46]
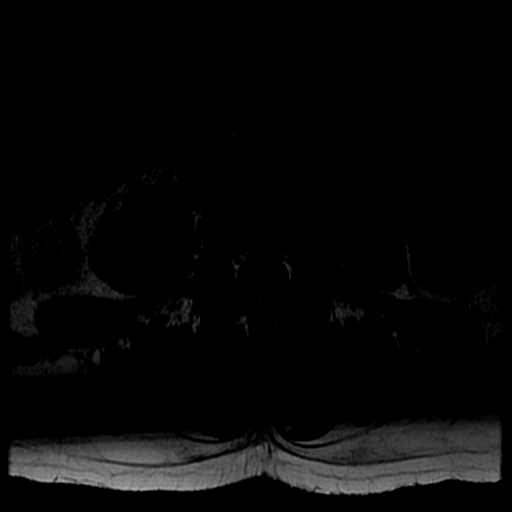
[im 41/46]
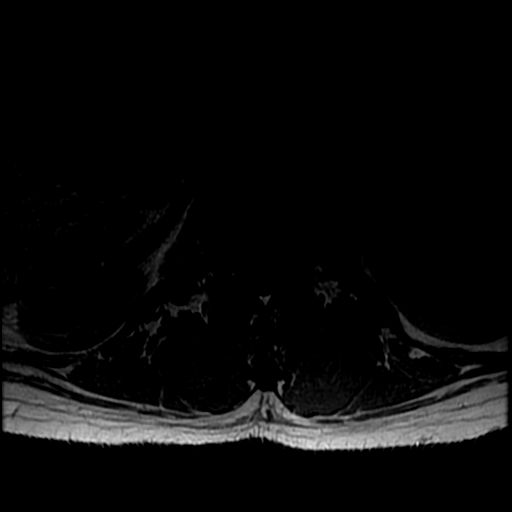

[19 of 48 positions shown; findings below may reference images not displayed]

FINDINGS: MRI THORACIC SPINE FINDINGS

Alignment:  Physiologic.

Vertebrae: Mild marrow edema leftward at the level of the T10-11
endplate with mild adjacent paravertebral edema/enhancement. No disc
collapse and only minimal associated disc T2 hyperintensity.

Cord: Normal signal and morphology. No evidence of spinal canal
collection.

Paraspinal and other soft tissues: Trace bilateral pleural effusion.
Nodular opacities in the bilateral lungs, recently evaluated by CT.

Disc levels:

Small disc protrusions at T5-6, T6-7, and T8-9. Negative facets. No
neural impingement.

MRI LUMBAR SPINE FINDINGS

Segmentation:  Standard.

Alignment:  Physiologic.

Vertebrae:  No fracture, evidence of discitis, or bone lesion.

Conus medullaris: Extends to the L1-2 level and appears normal.

Paraspinal and other soft tissues: Negative for perispinal mass or
inflammation

Disc levels:

Diffusely preserved disc height and hydration. No facet spurring. No
neural impingement.
IMPRESSION: 1. Minimal marrow edema about the left aspect of the T10-11
endplates, early spinal infection is possible given absence on
comparison from last month. No spinal canal collection.
2. Negative lumbar spine.

## 2021-12-27 MED ORDER — KETOROLAC TROMETHAMINE 15 MG/ML IJ SOLN
7.5000 mg | Freq: Once | INTRAMUSCULAR | Status: AC
Start: 2021-12-27 — End: 2021-12-27
  Administered 2021-12-27: 7.5 mg via INTRAVENOUS
  Filled 2021-12-27: qty 1

## 2021-12-27 MED ORDER — GADOBUTROL 1 MMOL/ML IV SOLN
7.0000 mL | Freq: Once | INTRAVENOUS | Status: AC | PRN
  Administered 2021-12-27: 7 mL via INTRAVENOUS

## 2021-12-27 MED ORDER — LORAZEPAM 2 MG/ML IJ SOLN
1.0000 mg | Freq: Once | INTRAMUSCULAR | Status: AC | PRN
  Administered 2021-12-27: 1 mg via INTRAVENOUS
  Filled 2021-12-27: qty 1

## 2021-12-27 MED ORDER — METHADONE HCL 5 MG PO TABS
135.0000 mg | ORAL_TABLET | Freq: Every day | ORAL | Status: DC
  Administered 2021-12-27 – 2022-01-10 (×15): 135 mg via ORAL
  Filled 2021-12-27 (×15): qty 1

## 2021-12-27 MED ORDER — POLYETHYLENE GLYCOL 3350 17 G PO PACK
17.0000 g | PACK | Freq: Every day | ORAL | Status: DC
  Administered 2021-12-28 – 2021-12-30 (×3): 17 g via ORAL
  Filled 2021-12-27 (×11): qty 1

## 2021-12-27 NOTE — Plan of Care (Signed)
Attempted to See pt-he was om MRI. Will try to see later this afternoon. Blood Cx 5/22 + staph aureus

## 2021-12-27 NOTE — Consult Note (Signed)
[facility]ious Disease    Date of Admission:  12/26/2021   Total days of inpatient antibiotics 1        Reason for Consult: GPC blood Cx    Principal Problem:   Bacteremia with gram positive cocci  Active Problems:   Chronic pain syndrome/methadone abuse    GERD (gastroesophageal reflux disease)   Tobacco abuse   Endocarditis of tricuspid valve   Polysubstance abuse ([name])   Hepatitis C, chronic ([name])   Septic pulmonary embolism ([name])   Anxiety   Normocytic anemia   Prolonged QT interval   Assessment: 30 YM with HX of MSSA TV IE with septic emboli SP angiovac with residual  subvalvular component of vegetation not accessible OR Cx+ MSSA(4/26) admitted with GPC bacteremia.   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 2nd dose noted to be on 5/17->postponed to 5/22. In the Interim, he had presented to ED with temp 101. Called back due to positive blood Cx. Followed by ID, Dr. West Bali.   -Reports back pain x 1 week. MRI spine(TL) showed T10-T11 bone marrow edema not present on iMRI on 11/19/21. As such would engage IR for Bx and Cx -Pt reports his bottom teeth hurt, he has poor dentition. -Denies IVDA since prior admission -As he has been on oritavancin/dalbavancin it seems less likely that bacteremia is due to incomplete treatment of initial bacteremia. I suspect either a nidus of infection(retained TV vegetation vs spinal vs odontogenic ) or unaccounted for IVDA contributing to bacteremia. -CT angio noted cavitary nodules(known Hx of septic emboli) Recommendations:  -Continue vancomycin -Consult IR for Bx with path and Cx of T spine -TTE -Orthopantogram -Follow blood Cx for sensitivities from 5/20 -Follow repeat blood Cx for clearance.     #Chronic HCV -RNA 794,000 ib 11/14/21 -Follow-up with ID outpatient for treatment Microbiology:   Antibiotics: Dalbavancin 5/10 5/22  Vancomycin 5/24 Cultures: Blood 5/22 2/2 Staph aureus 5/24 pending    HPI: Reginald Clarke is a 31 y.o. male with IVDA, HCV, recent admission for MSSA bacteremia with TV IE SP angiovac and plan to treat with dalbavancin outpatient admitted with positive  blood Cx. He presented to the ED on 5/20 with temp of 101 and called back with GPC + blood Cx.On arrival to the ED, vitals stable. Reports worsening back pain x 1 week. Denies  recent IVDA. He is followed by ID , Dr. West Bali, noted to have dalbavancin dose postponed form 5/17->5/22.   Review of Systems: Review of Systems  All other systems reviewed and are negative.  Past Medical History:  Diagnosis Date   Hepatitis    hx of hep C per pt    Social History   Tobacco Use   Smoking status: Every Day    Types: Cigarettes   Smokeless tobacco: Never  Substance Use Topics   Alcohol use: No   Drug use: No    History reviewed. No pertinent family history. Scheduled Meds:  methadone  135 mg Oral Daily   pantoprazole  40 mg Oral Daily   polyethylene glycol  17 g Oral Daily   sodium chloride flush  3 mL Intravenous Q12H   Continuous Infusions:  sodium chloride     vancomycin 1,750 mg (12/27/21 0217)   PRN Meds:.sodium chloride,  acetaminophen **OR** acetaminophen, clonazePAM, HYDROcodone-acetaminophen, methocarbamol, sodium chloride flush Allergies  Allergen Reactions   Penicillins Hives   Amoxil [Amoxicillin Trihydrate]     OBJECTIVE: Blood pressure 103/63, pulse 94, temperature 99 F (37.2 C), temperature source Oral, resp. rate 18, height 5\' 10"  (1.778 m), weight 76.2 kg, SpO2 95 %.  Physical Exam Constitutional:      General: He is not in acute distress.    Appearance: He is normal weight. He is not toxic-appearing.  HENT:     Head: Normocephalic and atraumatic.     Right  Ear: External ear normal.     Left Ear: External ear normal.     Nose: No congestion or rhinorrhea.     Mouth/Throat:     Mouth: Mucous membranes are moist.     Pharynx: Oropharynx is clear.     Comments: Poor dentition Eyes:     Extraocular Movements: Extraocular movements intact.     Conjunctiva/sclera: Conjunctivae normal.     Pupils: Pupils are equal, round, and reactive to light.  Cardiovascular:     Rate and Rhythm: Normal rate and regular rhythm.     Heart sounds: No murmur heard.   No friction rub. No gallop.  Pulmonary:     Effort: Pulmonary effort is normal.     Breath sounds: Normal breath sounds.  Abdominal:     General: Abdomen is flat. Bowel sounds are normal.     Palpations: Abdomen is soft.  Musculoskeletal:        General: No swelling. Normal range of motion.     Cervical back: Normal range of motion and neck supple.     Comments: Upper back tender to palpation  Skin:    General: Skin is warm and dry.  Neurological:     General: No focal deficit present.     Mental Status: He is oriented to person, place, and time.  Psychiatric:        Mood and Affect: Mood normal.    Lab Results Lab Results  Component Value Date   WBC 6.5 12/27/2021   HGB 9.5 (L) 12/27/2021   HCT 29.9 (L) 12/27/2021   MCV 92.9 12/27/2021   PLT 152 12/27/2021    Lab Results  Component Value Date   CREATININE 0.69 12/27/2021   BUN 5 (L) 12/27/2021   NA 135 12/27/2021   K 3.6 12/27/2021   CL 102 12/27/2021   CO2 26 12/27/2021    Lab Results  Component Value Date   ALT 29 12/26/2021   AST 28 12/26/2021   ALKPHOS 64 12/26/2021   BILITOT 0.5 12/26/2021       Laurice Record, [facility] 12/27/2021, 2:28 PM

## 2021-12-27 NOTE — Progress Notes (Addendum)
PROGRESS NOTE        PATIENT DETAILS Name: Reginald Clarke Age: 31 y.o. Sex: male Date of Birth: 1990/09/16 Admit Date: 12/26/2021 Admitting Physician Orland MustardAllison Wolfe, MD PCP:Pcp, No  Brief Summary: Patient is a 31 y.o.  male with history of MSSA bacteremia and native tricuspid valve endocarditis-s/p angiovac procedure-completed inpatient intravenous Ancef on 5/10-followed by ID in the outpatient setting with weekly dalbavancin-referred to the hospital as outpatient blood cultures on 5/22 were positive for MSSA.  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  Completed cefazolin on 5/10-planned weekly dalbavancin at the ID clinic.   5/22>> outpatient blood culture positive for MSSA 5/24>> [facility]-worsening back pain.  Significant studies: 4/13>> MRI thoracic/MRI lumbar spine: No obvious discitis/osteomyelitis. 4/21>> TEE: EF 60-65%, tricuspid valve vegetation. 5/24>> CTA chest: Cavitary nodule RLL-additional scattered nodules.  No PE.  Significant microbiology data: 4/09>> blood culture: MSSA 4/12>> blood culture: No growth 5/22>> blood culture: Gram-positive cocci 5/24>> blood culture: No growth  Procedures: 4/26>> debridement of right atrial mass/tricuspid valve vegetation by cardiothoracic surgery.  Consults: None   Subjective: Complains of worsening mid back pain x1 week.  Objective: Vitals: Blood pressure (!) 102/55, pulse 94, temperature 98.5 F (36.9 C), temperature source Oral, resp. rate 18, height 5\' 10"  (1.778 m), weight 76.2 kg, SpO2 95 %.   Exam: Gen Exam:Alert awake-not in any distress HEENT:atraumatic, normocephalic Chest: B/L clear to auscultation anteriorly CVS:S1S2 regular Abdomen:soft non tender, non distended Extremities:no edema Neurology: Non focal Skin: no rash  Pertinent Labs/Radiology:    Latest Ref Rng & Units 12/27/2021     3:32 AM 12/26/2021   12:16 PM 12/24/2021    2:27 PM  CBC  WBC 4.0 - 10.5 K/uL 6.5   7.5   7.2    Hemoglobin 13.0 - 17.0 g/dL 9.5   09.811.1   11.910.5    Hematocrit 39.0 - 52.0 % 29.9   34.5   32.4    Platelets 150 - 400 K/uL 152   144   112      Lab Results  Component Value Date   NA 135 12/27/2021   K 3.6 12/27/2021   CL 102 12/27/2021   CO2 26 12/27/2021      Assessment/Plan: Persistent MSSA bacteremia with native tricuspid valve endocarditis with septic emboli to lungs: Afebrile-nontoxic-appearing-on IV vancomycin-awaiting further recommendations from infectious disease.  Worsening back pain: Given persistent bacteremia-concern for discitis/osteomyelitis-awaiting MRI thoracic/lumbar spine.  Chronic pain syndrome: Continue methadone-given worsening back pain-on breakthrough regimen with Percocet.  Add MiraLAX to ensure no constipation.  Prolonged QTc: Watch closely while on methadone-QTc this morning stable.  Anxiety disorder: As needed benzodiazepines-stable this morning.  Chronic hepatitis C: Treatment deferred to the outpatient setting.  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  Tobacco use: Counseled  BMI: Estimated body mass index is 24.11 kg/m as calculated from the following:   Height as of this encounter: 5\' 10"  (1.778 m).   Weight as of this encounter: 76.2 kg.   Code status:   Code Status: Full Code   DVT Prophylaxis: SCDs Start: 12/26/21 1724   Family Communication: None at bedside-he will update family members himself-he does not want me calling any particular family member.   Disposition Plan: Status is: Inpatient Remains inpatient appropriate because:  Persistent MSSA bacteremia-not yet stable for discharge.   Planned Discharge Destination:Home   Diet: Diet Order             Diet regular Room service appropriate? Yes; Fluid consistency: Thin  Diet effective now                      Antimicrobial agents: Anti-infectives (From admission, onward)    Start     Dose/Rate Route Frequency Ordered Stop   12/27/21 0200  vancomycin (VANCOREADY) IVPB 1750 mg/350 mL        1,750 mg 175 mL/hr over 120 Minutes Intravenous Every 12 hours 12/26/21 1413     12/26/21 1400  vancomycin (VANCOREADY) IVPB 1750 mg/350 mL        1,750 mg 175 mL/hr over 120 Minutes Intravenous  Once 12/26/21 1354 12/26/21 1645        MEDICATIONS: Scheduled Meds:  methadone  135 mg Oral Daily   pantoprazole  40 mg Oral Daily   polyethylene glycol  17 g Oral Daily   sodium chloride flush  3 mL Intravenous Q12H   Continuous Infusions:  sodium chloride     vancomycin 1,750 mg (12/27/21 0217)   PRN Meds:.sodium chloride, acetaminophen **OR** acetaminophen, clonazePAM, HYDROcodone-acetaminophen, methocarbamol, sodium chloride flush   I have personally reviewed following labs and imaging studies  LABORATORY DATA: CBC: Recent Labs  Lab 12/22/21 2025 12/24/21 1427 12/26/21 1216 12/27/21 0332  WBC 9.4 7.2 7.5 6.5  NEUTROABS 7.7  --  5.0  --   HGB 11.0* 10.5* 11.1* 9.5*  HCT 34.9* 32.4* 34.5* 29.9*  MCV 94.8 89.8 94.3 92.9  PLT 129* 112* 144* 152    Basic Metabolic Panel: Recent Labs  Lab 12/22/21 2025 12/26/21 1216 12/26/21 1809 12/27/21 0332  NA 136 138  --  135  K 3.8 3.9  --  3.6  CL 101 103  --  102  CO2 25 25  --  26  GLUCOSE 117* 88  --  90  BUN 11 <5*  --  5*  CREATININE 0.97 0.63  --  0.69  CALCIUM 9.0 8.8*  --  8.2*  MG  --   --  2.0  --     GFR: Estimated Creatinine Clearance: 139.4 mL/min (by C-G formula based on SCr of 0.69 mg/dL).  Liver Function Tests: Recent Labs  Lab 12/22/21 2025 12/24/21 1427 12/26/21 1216  AST 28 22 28   ALT 29 24 29   ALKPHOS 62  --  64  BILITOT 0.8 0.5 0.5  PROT 7.0 6.9 7.3  ALBUMIN 3.2*  --  3.1*   No results for input(s): LIPASE, AMYLASE in the last 168 hours. No results for input(s): AMMONIA in the last 168  hours.  Coagulation Profile: Recent Labs  Lab 12/24/21 1427  INR 1.2*    Cardiac Enzymes: No results for input(s): CKTOTAL, CKMB, CKMBINDEX, TROPONINI in the last 168 hours.  BNP (last 3 results) No results for input(s): PROBNP in the last 8760 hours.  Lipid Profile: No results for input(s): CHOL, HDL, LDLCALC, TRIG, CHOLHDL, LDLDIRECT in the last 72 hours.  Thyroid Function Tests: Recent Labs    12/26/21 1809  TSH 2.125    Anemia Panel: Recent Labs    12/26/21 1809  FERRITIN 131  TIBC 214*  IRON 23*    Urine analysis:    Component Value Date/Time   COLORURINE YELLOW 12/26/2021 1106   APPEARANCEUR CLEAR 12/26/2021 1106   LABSPEC 1.015 12/26/2021 1106  PHURINE 6.0 12/26/2021 1106   GLUCOSEU NEGATIVE 12/26/2021 1106   HGBUR NEGATIVE 12/26/2021 1106   BILIRUBINUR NEGATIVE 12/26/2021 1106   KETONESUR NEGATIVE 12/26/2021 1106   PROTEINUR NEGATIVE 12/26/2021 1106   NITRITE NEGATIVE 12/26/2021 1106   LEUKOCYTESUR NEGATIVE 12/26/2021 1106    Sepsis Labs: Lactic Acid, Venous    Component Value Date/Time   LATICACIDVEN 1.4 12/26/2021 1412    MICROBIOLOGY: Recent Results (from the past 240 hour(s))  Blood culture (routine single)     Status: Abnormal (Preliminary result)   Collection Time: 12/24/21  2:27 AM   Specimen: Blood  Result Value Ref Range Status   MICRO NUMBER: [id number]  Preliminary   SPECIMEN QUALITY: Adequate  Preliminary   Source BLOOD 2  Preliminary   STATUS: PRELIMINARY  Preliminary   Result:   Preliminary    Pos 22.7 Pos 22.2 No growth to date. Culture is continuously monitored for a total of 120 hours incubation. A change in status will result in a phone report followed by an updated printed culture report.   ISOLATE 1: Gram positive cocci isolated (AA)  Preliminary    Comment: Gram positive cocci isolated , from aerobic and anaerobic bottles   COMMENT: Aerobic and anaerobic bottle received.  Preliminary  Blood culture (routine single)      Status: Abnormal (Preliminary result)   Collection Time: 12/24/21  2:27 PM   Specimen: Blood  Result Value Ref Range Status   MICRO NUMBER: [id number]  Preliminary   SPECIMEN QUALITY: Adequate  Preliminary   Source BLOOD 1  Preliminary   STATUS: PRELIMINARY  Preliminary   Result:   Preliminary    Pos 22.9 Pos 22.9 No growth to date. Culture is continuously monitored for a total of 120 hours incubation. A change in status will result in a phone report followed by an updated printed culture report.   ISOLATE 1: Gram positive cocci isolated (AA)  Preliminary    Comment: Gram positive cocci isolated , from aerobic and anaerobic bottles   COMMENT: Aerobic and anaerobic bottle received.  Preliminary  Blood culture (routine x 2)     Status: None (Preliminary result)   Collection Time: 12/26/21 12:17 PM   Specimen: BLOOD RIGHT FOREARM  Result Value Ref Range Status   Specimen Description BLOOD RIGHT FOREARM  Final   Special Requests   Final    AEROBIC BOTTLE ONLY Blood Culture results may not be optimal due to an inadequate volume of blood received in culture bottles   Culture   Final    NO GROWTH < 24 HOURS Performed at [facility], [address] [place], [place] [address]    Report Status PENDING  Incomplete    RADIOLOGY STUDIES/RESULTS: DG Chest 2 View  Result Date: 12/26/2021 CLINICAL DATA:  Provided history: Shortness of breath. EXAM: CHEST - 2 VIEW COMPARISON:  Chest radiographs 12/22/2021 and earlier FINDINGS: Heart size within normal limits. Small ill-defined opacity within the right lung base, similar to the prior chest radiograph 12/22/2021. No appreciable airspace consolidation within the left lung. No sizable pleural effusion. No evidence of pneumothorax. No acute bony abnormality identified. IMPRESSION: Small ill-defined opacity within the right lung base, similar to the prior chest radiographs of 12/22/2021. This may reflect scarring or incompletely resolved right lower  lobe pneumonia. Electronically Signed   By: Jackey Loge D.O.   On: 12/26/2021 12:41   CT Angio Chest PE W and/or Wo Contrast  Result Date: 12/26/2021 CLINICAL DATA:  Pt arrived POV  from home c/o back pin and nausea x1 week. Pt states he had blood cultures drawn through infectious disease and they called stating they were positive and to come here. EXAM: CT ANGIOGRAPHY CHEST WITH CONTRAST TECHNIQUE: Multidetector CT imaging of the chest was performed using the standard protocol during bolus administration of intravenous contrast. Multiplanar CT image reconstructions and MIPs were obtained to evaluate the vascular anatomy. RADIATION DOSE REDUCTION: This exam was performed according to the departmental dose-optimization program which includes automated exposure control, adjustment of the mA and/or kV according to patient size and/or use of iterative reconstruction technique. CONTRAST:  OMNIPAQUE IOHEXOL 350 MG/ML SOLN COMPARISON:  None Available. FINDINGS: Cardiovascular: No filling defects within the pulmonary arteries to suggest acute pulmonary embolism. Heart appears normal. Mediastinum/Nodes: No axillary or supraclavicular adenopathy. No mediastinal or hilar adenopathy. No pericardial fluid. Esophagus normal. Lungs/Pleura: Cavitary nodule with gas and fluid at the RIGHT lung base measures 2.3 x 1.5 cm (105/7). Solid nodule in the RIGHT lower lobe measures 6 mm on image 101/7). Semi-solid nodule in the LEFT lower lobe measures 7 mm on image 7/7. Solid nodule along the RIGHT horizontal fissure measures 7 mm image 62. Solid nodule in LEFT upper lobe measures 3 mm on image 29/7. Smaller additional peripheral subpleural nodules in the RIGHT lung on image 74/7 Upper Abdomen: Limited view of the liver, kidneys, pancreas are unremarkable. Normal adrenal glands. Musculoskeletal: No aggressive osseous lesion. Review of the MIP images confirms the above findings. IMPRESSION: 1. Cavitary nodule with fluid and gas in  the RIGHT lower lobe is concerning for pulmonary infection. 2. Additional scattered pulmonary nodules in the LEFT and RIGHT lung are concerning for early pulmonary infections. 3. Similar pattern of pulmonary nodules in the lower lobes on CT abdomen 11/11/2021. 4. No pulmonary embolism.  Heart appears normal. Electronically Signed   By: Genevive Bi M.D.   On: 12/26/2021 14:56     LOS: 1 day   Jeoffrey Massed, MD  [facility]    To contact the attending provider between 7A-7P or the covering provider during after hours 7P-7A, please log into the web site [email or web address] and access using universal [place] password for that web site. If you do not have the password, please call the hospital operator.  12/27/2021, 12:17 PM

## 2021-12-27 NOTE — Care Management (Signed)
He was discharged on 5/10 after one dose of IV oritavancin.  Was getting IV Abx outpatient through [facility] IV dalbavancin 5/22, next dose due on 5/30 and 6/6  Readmitted 5/24 for +blood cultures.  TOC will continue to follow

## 2021-12-27 NOTE — Plan of Care (Signed)
  Problem: Education: Goal: Knowledge of General Education information will improve Description: Including pain rating scale, medication(s)/side effects and non-pharmacologic comfort measures Outcome: Progressing   Problem: Clinical Measurements: Goal: Will remain free from infection Outcome: Progressing Goal: Diagnostic test results will improve Outcome: Progressing Goal: Cardiovascular complication will be avoided Outcome: Progressing   Problem: Clinical Measurements: Goal: Diagnostic test results will improve Outcome: Progressing   Problem: Clinical Measurements: Goal: Diagnostic test results will improve Outcome: Progressing   Problem: Clinical Measurements: Goal: Cardiovascular complication will be avoided Outcome: Progressing   Problem: Nutrition: Goal: Adequate nutrition will be maintained Outcome: Progressing

## 2021-12-28 DIAGNOSIS — I079 Rheumatic tricuspid valve disease, unspecified: Secondary | ICD-10-CM | POA: Diagnosis not present

## 2021-12-28 DIAGNOSIS — R7881 Bacteremia: Secondary | ICD-10-CM | POA: Diagnosis not present

## 2021-12-28 DIAGNOSIS — F419 Anxiety disorder, unspecified: Secondary | ICD-10-CM | POA: Diagnosis not present

## 2021-12-28 DIAGNOSIS — G894 Chronic pain syndrome: Secondary | ICD-10-CM | POA: Diagnosis not present

## 2021-12-28 LAB — BASIC METABOLIC PANEL
Anion gap: 8 (ref 5–15)
BUN: <5 mg/dL — ABNORMAL LOW (ref 6–20)
CO2: 27 mmol/L (ref 22–32)
Calcium: 8.5 mg/dL — ABNORMAL LOW (ref 8.9–10.3)
Chloride: 105 mmol/L (ref 98–111)
Creatinine, Ser: 0.59 mg/dL — ABNORMAL LOW (ref 0.61–1.24)
GFR, Estimated: >60 mL/min (ref >60)
Glucose, Bld: 108 mg/dL — ABNORMAL HIGH (ref 70–99)
Potassium: 3.7 mmol/L (ref 3.5–5.1)
Sodium: 140 mmol/L (ref 135–145)

## 2021-12-28 LAB — MAGNESIUM: Magnesium: 2 mg/dL (ref 1.7–2.4)

## 2021-12-28 LAB — CULTURE, BLOOD (SINGLE)
MICRO NUMBER:: 13428336
SPECIMEN QUALITY:: ADEQUATE

## 2021-12-28 LAB — FUNGAL ORGANISM REFLEX

## 2021-12-28 LAB — FUNGUS CULTURE WITH STAIN

## 2021-12-28 LAB — FUNGUS CULTURE RESULT

## 2021-12-28 MED ORDER — CEFAZOLIN SODIUM-DEXTROSE 2-4 GM/100ML-% IV SOLN
2.0000 g | Freq: Three times a day (TID) | INTRAVENOUS | Status: DC
  Administered 2021-12-28 – 2022-01-10 (×38): 2 g via INTRAVENOUS
  Filled 2021-12-28 (×39): qty 100

## 2021-12-28 MED ORDER — ALPRAZOLAM 0.5 MG PO TABS: 0.2500 mg | ORAL_TABLET | Freq: Three times a day (TID) | ORAL | Status: DC | PRN

## 2021-12-28 MED ORDER — KETOROLAC TROMETHAMINE 30 MG/ML IJ SOLN
30.0000 mg | Freq: Three times a day (TID) | INTRAMUSCULAR | Status: DC | PRN
  Administered 2021-12-28 – 2021-12-29 (×4): 30 mg via INTRAVENOUS
  Filled 2021-12-28 (×7): qty 1

## 2021-12-28 MED ORDER — POTASSIUM CHLORIDE CRYS ER 20 MEQ PO TBCR
20.0000 meq | EXTENDED_RELEASE_TABLET | Freq: Once | ORAL | Status: AC
  Administered 2021-12-28: 20 meq via ORAL
  Filled 2021-12-28: qty 1

## 2021-12-28 MED ORDER — ALPRAZOLAM 0.5 MG PO TABS
0.5000 mg | ORAL_TABLET | Freq: Three times a day (TID) | ORAL | Status: DC | PRN
  Administered 2021-12-28 – 2022-01-10 (×35): 0.5 mg via ORAL
  Filled 2021-12-28 (×36): qty 1

## 2021-12-28 MED ORDER — ENOXAPARIN SODIUM 40 MG/0.4ML IJ SOSY
40.0000 mg | PREFILLED_SYRINGE | Freq: Every day | INTRAMUSCULAR | Status: DC
  Administered 2021-12-28 – 2022-01-10 (×10): 40 mg via SUBCUTANEOUS
  Filled 2021-12-28 (×13): qty 0.4

## 2021-12-28 NOTE — Progress Notes (Addendum)
PROGRESS NOTE        PATIENT DETAILS Name: Reginald Clarke Age: 31 y.o. Sex: male Date of Birth: January 13, 1991 Admit Date: 12/26/2021 Admitting Physician Orland Mustard, MD PCP:Pcp, No  Brief Summary: Patient is a 31 y.o.  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  Patient also has been having worsening back pain x1 week prior to this hospitalization.  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  Completed cefazolin on 5/10-planned weekly dalbavancin at the ID clinic.   5/22>> outpatient blood culture positive for MSSA 5/24>> admit to [facility] for persistent bacteremia-worsening back pain.  Significant studies: 4/13>> MRI thoracic/MRI lumbar spine: No obvious discitis/osteomyelitis. 4/21>> TEE: EF 60-65%, tricuspid valve vegetation. 5/24>> CTA chest: Cavitary nodule RLL-additional scattered nodules.  No PE. 5/25>> orthopantogram: No periapical abscesses. 5/25>> MRI T-spine: Edema T10-11 endplates-early spinal infection is possible. 5/25>> MRI L-spine: Negative for infection. 5/25>>TTE: EF 60-65%, 1.4 x 1.8 cm mobile mass on the septal tricuspid leaflet.  Significant microbiology data: 4/09>> blood culture: MSSA 4/12>> blood culture: No growth 5/22>> blood culture: MSSA 5/24>> blood culture: No growth  Procedures: 4/26>> debridement of right atrial mass/tricuspid valve vegetation by cardiothoracic surgery.  Consults: None   Subjective: Continues to complain of back pain-wants to try Toradol.  Objective: Vitals: Blood pressure (!) 93/58, pulse 95, temperature 98.2 F (36.8 C), temperature source Oral, resp. rate 17, height 5\' 10"   (1.778 m), weight 76.2 kg, SpO2 97 %.   Exam: Gen Exam:Alert awake-not in any distress HEENT:atraumatic, normocephalic Chest: B/L clear to auscultation anteriorly CVS:S1S2 regular Abdomen:soft non tender, non distended Extremities:no edema Neurology: Non focal Skin: no rash   Pertinent Labs/Radiology:    Latest Ref Rng & Units 12/27/2021    3:32 AM 12/26/2021   12:16 PM 12/24/2021    2:27 PM  CBC  WBC 4.0 - 10.5 K/uL 6.5   7.5   7.2    Hemoglobin 13.0 - 17.0 g/dL 9.5   12/26/2021   97.0    Hematocrit 39.0 - 52.0 % 29.9   34.5   32.4    Platelets 150 - 400 K/uL 152   144   112      Lab Results  Component Value Date   NA 140 12/28/2021   K 3.7 12/28/2021   CL 105 12/28/2021   CO2 27 12/28/2021     Assessment/Plan: Persistent 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  ID recommending CT surgery evaluation given persistent bacteremia.  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  No concerning findings on physical exam.    Chronic pain syndrome: Continue methadone-on Percocet/Toradol for breakthrough pain.    Prolonged QTc: Continue telemetry monitoring-repeat twelve-lead EKG 5/27.  Keep K > 4, Mg > 2.    Anxiety disorder: As needed benzodiazepines-stable this morning.  Chronic hepatitis C: Treatment deferred to the outpatient setting.  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  Tobacco use: Counseled  BMI: Estimated body mass  index is 24.11 kg/m as calculated from the following:   Height as of this encounter:  (1.778 m).   Weight as of this encounter: 76.2 kg.   Code status:   Code Status: Full Code   DVT Prophylaxis: SCDs Start: 12/26/21 1724 start Lovenox/heparin-once  possible disc aspiration has been completed.   Family Communication: None at bedside-he will update family members himself-he does not want me calling any particular family member.   Disposition Plan: Status is: Inpatient Remains inpatient appropriate because: Persistent MSSA bacteremia-not yet stable for discharge.   Planned Discharge Destination:Home   Diet: Diet Order             Diet NPO time specified  Diet effective now                     Antimicrobial agents: Anti-infectives (From admission, onward)    Start     Dose/Rate Route Frequency Ordered Stop   12/27/21 0200  vancomycin (VANCOREADY) IVPB 1750 mg/350 mL        1,750 mg 175 mL/hr over 120 Minutes Intravenous Every 12 hours 12/26/21 1413     12/26/21 1400  vancomycin (VANCOREADY) IVPB 1750 mg/350 mL        1,750 mg 175 mL/hr over 120 Minutes Intravenous  Once 12/26/21 1354 12/26/21 1645        MEDICATIONS: Scheduled Meds:  methadone  135 mg Oral Daily   pantoprazole  40 mg Oral Daily   polyethylene glycol  17 g Oral Daily   sodium chloride flush  3 mL Intravenous Q12H   Continuous Infusions:  sodium chloride     vancomycin 1,750 mg (12/28/21 0215)   PRN Meds:.sodium chloride, acetaminophen **OR** acetaminophen, clonazePAM, HYDROcodone-acetaminophen, ketorolac, methocarbamol, sodium chloride flush   I have personally reviewed following labs and imaging studies  LABORATORY DATA: CBC: Recent Labs  Lab 12/22/21 2025 12/24/21 1427 12/26/21 1216 12/27/21 0332  WBC 9.4 7.2 7.5 6.5  NEUTROABS 7.7  --  5.0  --   HGB 11.0* 10.5* 11.1* 9.5*  HCT 34.9* 32.4* 34.5* 29.9*  MCV 94.8 89.8 94.3 92.9  PLT 129* 112* 144* 152     Basic Metabolic Panel: Recent Labs  Lab 12/22/21 2025 12/26/21 1216 12/26/21 1809 12/27/21 0332 12/28/21 0237  NA 136 138  --  135 140  K 3.8 3.9  --  3.6 3.7  CL 101 103  --  102 105  CO2 25 25  --  26 27  GLUCOSE 117* 88  --  90 108*  BUN 11 <5*  --  5* <5*   CREATININE 0.97 0.63  --  0.69 0.59*  CALCIUM 9.0 8.8*  --  8.2* 8.5*  MG  --   --  2.0  --  2.0     GFR: Estimated Creatinine Clearance: 139.4 mL/min (A) (by C-G formula based on SCr of 0.59 mg/dL (L)).  Liver Function Tests: Recent Labs  Lab 12/22/21 2025 12/24/21 1427 12/26/21 1216  AST ALT ALKPHOS 62  --  64  BILITOT 0.8 0.5 0.5  PROT 7.0 6.9 7.3  ALBUMIN 3.2*  --  3.1*    No results for input(s): LIPASE, AMYLASE in the last 168 hours. No results for input(s): AMMONIA in the last 168 hours.  Coagulation Profile: Recent Labs  Lab 12/24/21 1427  INR 1.2*     Cardiac Enzymes: No results for input(s): CKTOTAL, CKMB, CKMBINDEX, TROPONINI in the last  168 hours.  BNP (last 3 results) No results for input(s): PROBNP in the last 8760 hours.  Lipid Profile: No results for input(s): CHOL, HDL, LDLCALC, TRIG, CHOLHDL, LDLDIRECT in the last 72 hours.  Thyroid Function Tests: Recent Labs    12/26/21 1809  TSH 2.125     Anemia Panel: Recent Labs    12/26/21 1809  FERRITIN 131  TIBC 214*  IRON 23*     Urine analysis:    Component Value Date/Time   COLORURINE YELLOW 12/26/2021 1106   APPEARANCEUR CLEAR 12/26/2021 1106   LABSPEC 1.015 12/26/2021 1106   PHURINE 6.0 12/26/2021 1106   GLUCOSEU NEGATIVE 12/26/2021 1106   HGBUR NEGATIVE 12/26/2021 1106   BILIRUBINUR NEGATIVE 12/26/2021 1106   KETONESUR NEGATIVE 12/26/2021 1106   PROTEINUR NEGATIVE 12/26/2021 1106   NITRITE NEGATIVE 12/26/2021 1106   LEUKOCYTESUR NEGATIVE 12/26/2021 1106    Sepsis Labs: Lactic Acid, Venous    Component Value Date/Time   LATICACIDVEN 1.4 12/26/2021 1412    MICROBIOLOGY: Recent Results (from the past 240 hour(s))  Blood culture (routine single)     Status: Abnormal   Collection Time: 12/24/21  2:27 AM   Specimen: Blood  Result Value Ref Range Status   MICRO NUMBER: [id number]  Final   SPECIMEN QUALITY: Adequate  Final   Source BLOOD 2  Final    STATUS: FINAL  Final   ISOLATE 1: Staphylococcus aureus (AA)  Final    Comment: Staphylococcus aureus Negative for inducible clindamycin resistance. , from aerobic and anaerobic bottles   COMMENT: Aerobic and anaerobic bottle received.  Final      Susceptibility   Staphylococcus aureus - BLOOD CULTURE, POSITIVE 1    VANCOMYCIN 1 Sensitive     CLINDAMYCIN <=0.25 Sensitive     ERYTHROMYCIN >=8 Resistant     GENTAMICIN <=0.5 Sensitive     OXACILLIN* 1 Sensitive      * Oxacillin susceptible staphylococci are susceptible to other penicillinase-stable penicillins (e.g., methicillin, nafcillin), beta- lactam/beta-lactamase inhibitor combinations, and cephems with staphylococcal indications, including cefazolin.     TETRACYCLINE <=1 Sensitive     TRIMETH/SULFA* <=10 Sensitive      * Oxacillin susceptible staphylococci are susceptible to other penicillinase-stable penicillins (e.g., methicillin, nafcillin), beta- lactam/beta-lactamase inhibitor combinations, and cephems with staphylococcal indications, including cefazolin. Legend: S = Susceptible  I = Intermediate R = Resistant  NS = Not susceptible * = Not tested  NR = Not reported **NN = See antimicrobic comments   Blood culture (routine single)     Status: Abnormal   Collection Time: 12/24/21  2:27 PM   Specimen: Blood  Result Value Ref Range Status   MICRO NUMBER: [id number]  Final   SPECIMEN QUALITY: Adequate  Final   Source BLOOD 1  Final   STATUS: FINAL  Final   ISOLATE 1: Staphylococcus aureus (AA)  Final    Comment: Staphylococcus aureus Negative for inducible clindamycin resistance. , from aerobic and anaerobic bottles   COMMENT: Aerobic and anaerobic bottle received.  Final      Susceptibility   Staphylococcus aureus - BLOOD CULTURE, POSITIVE 1    VANCOMYCIN 1 Sensitive     CLINDAMYCIN <=0.25 Sensitive     ERYTHROMYCIN >=8 Resistant     GENTAMICIN <=0.5 Sensitive     OXACILLIN* 0.5 Sensitive      * Oxacillin  susceptible staphylococci are susceptible to other penicillinase-stable penicillins (e.g., methicillin, nafcillin), beta- lactam/beta-lactamase inhibitor combinations, and cephems with staphylococcal indications, including cefazolin.  TETRACYCLINE <=1 Sensitive     TRIMETH/SULFA* <=10 Sensitive      * Oxacillin susceptible staphylococci are susceptible to other penicillinase-stable penicillins (e.g., methicillin, nafcillin), beta- lactam/beta-lactamase inhibitor combinations, and cephems with staphylococcal indications, including cefazolin. Legend: S = Susceptible  I = Intermediate R = Resistant  NS = Not susceptible * = Not tested  NR = Not reported **NN = See antimicrobic comments   Blood culture (routine x 2)     Status: None (Preliminary result)   Collection Time: 12/26/21 12:17 PM   Specimen: BLOOD RIGHT FOREARM  Result Value Ref Range Status   Specimen Description BLOOD RIGHT FOREARM  Final   Special Requests   Final    AEROBIC BOTTLE ONLY Blood Culture results may not be optimal due to an inadequate volume of blood received in culture bottles   Culture   Final    NO GROWTH 2 DAYS Performed at [facility], [address] [place], [place] [address]    Report Status PENDING  Incomplete    RADIOLOGY STUDIES/RESULTS: DG Orthopantogram  Result Date: 12/27/2021 CLINICAL DATA:  Bacteremia EXAM: ORTHOPANTOGRAM/PANORAMIC COMPARISON:  None Available. FINDINGS: Multiple missing mandibular and maxillary teeth. Multiple remaining teeth demonstrate dental caries. No visible periapical abscess. No acute bony abnormality. IMPRESSION: Multiple missing teeth with multiple dental caries within the remaining teeth. No periapical abscess. Electronically Signed   By: Charlett Nose M.D.   On: 12/27/2021 19:15   DG Chest 2 View  Result Date: 12/26/2021 CLINICAL DATA:  Provided history: Shortness of breath. EXAM: CHEST - 2 VIEW COMPARISON:  Chest radiographs 12/22/2021 and earlier  FINDINGS: Heart size within normal limits. Small ill-defined opacity within the right lung base, similar to the prior chest radiograph 12/22/2021. No appreciable airspace consolidation within the left lung. No sizable pleural effusion. No evidence of pneumothorax. No acute bony abnormality identified. IMPRESSION: Small ill-defined opacity within the right lung base, similar to the prior chest radiographs of 12/22/2021. This may reflect scarring or incompletely resolved right lower lobe pneumonia. Electronically Signed   By: Jackey Loge D.O.   On: 12/26/2021 12:41   CT Angio Chest PE W and/or Wo Contrast  Result Date: 12/26/2021 CLINICAL DATA:  Pt arrived POV from home c/o back pin and nausea x1 week. Pt states he had blood cultures drawn through infectious disease and they called stating they were positive and to come here. EXAM: CT ANGIOGRAPHY CHEST WITH CONTRAST TECHNIQUE: Multidetector CT imaging of the chest was performed using the standard protocol during bolus administration of intravenous contrast. Multiplanar CT image reconstructions and MIPs were obtained to evaluate the vascular anatomy. RADIATION DOSE REDUCTION: This exam was performed according to the departmental dose-optimization program which includes automated exposure control, adjustment of the mA and/or kV according to patient size and/or use of iterative reconstruction technique. CONTRAST:  OMNIPAQUE IOHEXOL 350 MG/ML SOLN COMPARISON:  None Available. FINDINGS: Cardiovascular: No filling defects within the pulmonary arteries to suggest acute pulmonary embolism. Heart appears normal. Mediastinum/Nodes: No axillary or supraclavicular adenopathy. No mediastinal or hilar adenopathy. No pericardial fluid. Esophagus normal. Lungs/Pleura: Cavitary nodule with gas and fluid at the RIGHT lung base measures 2.3 x 1.5 cm (105/7). Solid nodule in the RIGHT lower lobe measures 6 mm on image 101/7). Semi-solid nodule in the LEFT lower lobe measures 7  mm on image 7/7. Solid nodule along the RIGHT horizontal fissure measures 7 mm image 62. Solid nodule in LEFT upper lobe measures 3 mm on image 29/7. Smaller  additional peripheral subpleural nodules in the RIGHT lung on image 74/7 Upper Abdomen: Limited view of the liver, kidneys, pancreas are unremarkable. Normal adrenal glands. Musculoskeletal: No aggressive osseous lesion. Review of the MIP images confirms the above findings. IMPRESSION: 1. Cavitary nodule with fluid and gas in the RIGHT lower lobe is concerning for pulmonary infection. 2. Additional scattered pulmonary nodules in the LEFT and RIGHT lung are concerning for early pulmonary infections. 3. Similar pattern of pulmonary nodules in the lower lobes on CT abdomen 11/11/2021. 4. No pulmonary embolism.  Heart appears normal. Electronically Signed   By: Genevive Bi M.D.   On: 12/26/2021 14:56   MR THORACIC SPINE W WO CONTRAST  Result Date: 12/27/2021 CLINICAL DATA:  Low back pain with infection suspected. Positive x-ray/CT EXAM: MRI THORACIC AND LUMBAR SPINE WITHOUT AND WITH CONTRAST TECHNIQUE: Multiplanar and multiecho pulse sequences of the thoracic and lumbar spine were obtained without and with intravenous contrast. CONTRAST:  7mL GADAVIST GADOBUTROL 1 MMOL/ML IV SOLN COMPARISON:  11/15/2021 FINDINGS: MRI THORACIC SPINE FINDINGS Alignment:  Physiologic. Vertebrae: Mild marrow edema leftward at the level of the T10-11 endplate with mild adjacent paravertebral edema/enhancement. No disc collapse and only minimal associated disc T2 hyperintensity. Cord: Normal signal and morphology. No evidence of spinal canal collection. Paraspinal and other soft tissues: Trace bilateral pleural effusion. Nodular opacities in the bilateral lungs, recently evaluated by CT. Disc levels: Small disc protrusions at T5-6, T6-7, and T8-9. Negative facets. No neural impingement. MRI LUMBAR SPINE FINDINGS Segmentation:  Standard. Alignment:  Physiologic. Vertebrae:  No  fracture, evidence of discitis, or bone lesion. Conus medullaris: Extends to the L1-2 level and appears normal. Paraspinal and other soft tissues: Negative for perispinal mass or inflammation Disc levels: Diffusely preserved disc height and hydration. No facet spurring. No neural impingement. IMPRESSION: 1. Minimal marrow edema about the left aspect of the T10-11 endplates, early spinal infection is possible given absence on comparison from last month. No spinal canal collection. 2. Negative lumbar spine. Electronically Signed   By: Tiburcio Pea M.D.   On: 12/27/2021 13:25   MR Lumbar Spine W Wo Contrast  Result Date: 12/27/2021 CLINICAL DATA:  Low back pain with infection suspected. Positive x-ray/CT EXAM: MRI THORACIC AND LUMBAR SPINE WITHOUT AND WITH CONTRAST TECHNIQUE: Multiplanar and multiecho pulse sequences of the thoracic and lumbar spine were obtained without and with intravenous contrast. CONTRAST:  7mL GADAVIST GADOBUTROL 1 MMOL/ML IV SOLN COMPARISON:  11/15/2021 FINDINGS: MRI THORACIC SPINE FINDINGS Alignment:  Physiologic. Vertebrae: Mild marrow edema leftward at the level of the T10-11 endplate with mild adjacent paravertebral edema/enhancement. No disc collapse and only minimal associated disc T2 hyperintensity. Cord: Normal signal and morphology. No evidence of spinal canal collection. Paraspinal and other soft tissues: Trace bilateral pleural effusion. Nodular opacities in the bilateral lungs, recently evaluated by CT. Disc levels: Small disc protrusions at T5-6, T6-7, and T8-9. Negative facets. No neural impingement. MRI LUMBAR SPINE FINDINGS Segmentation:  Standard. Alignment:  Physiologic. Vertebrae:  No fracture, evidence of discitis, or bone lesion. Conus medullaris: Extends to the L1-2 level and appears normal. Paraspinal and other soft tissues: Negative for perispinal mass or inflammation Disc levels: Diffusely preserved disc height and hydration. No facet spurring. No neural  impingement. IMPRESSION: 1. Minimal marrow edema about the left aspect of the T10-11 endplates, early spinal infection is possible given absence on comparison from last month. No spinal canal collection. 2. Negative lumbar spine. Electronically Signed   By: Audry Riles.D.  On: 12/27/2021 13:25   ECHOCARDIOGRAM COMPLETE BUBBLE STUDY  Result Date: 12/27/2021    ECHOCARDIOGRAM REPORT   Patient Name:   Reginald Clarke Date of Exam: 12/27/2021 Medical Rec #:  [id number]    Height:       70.0 in Accession #:    [id number]   Weight:       168.0 lb Date of Birth:  September 24, 1990    BSA:          1.938 m Patient Age:    30 years     BP:           126/77 mmHg Patient Gender: M            HR:           101 bpm. Exam Location:  Inpatient Procedure: 2D Echo, Cardiac Doppler and Color Doppler Indications:    Bacteremia  History:        Patient has prior history of Echocardiogram examinations, most                 recent 11/28/2021. H/O angiovac for TV vegetation.  Sonographer:    Roosvelt Maser RDCS Referring Phys: [id number] [facility] IMPRESSIONS  1. Left ventricular ejection fraction, by estimation, is 60 to 65%. The left ventricle has normal function. The left ventricle has no regional wall motion abnormalities. Left ventricular diastolic parameters were normal.  2. Right ventricular systolic function is normal. The right ventricular size is normal. There is normal pulmonary artery systolic pressure. The estimated right ventricular systolic pressure is 33.0 mmHg.  3. The mitral valve is normal in structure. No evidence of mitral valve regurgitation. No evidence of mitral stenosis.  4. There is a 1.4 x 1.8 cm moible mass associated with the septal tricuspid leaflet, consistent with vegetation. The tricuspid valve is abnormal. Tricuspid valve regurgitation is mild to moderate.  5. The aortic valve is tricuspid. Aortic valve regurgitation is not visualized. No aortic stenosis is present.  6. The inferior vena cava is normal in  size with greater than 50% respiratory variability, suggesting right atrial pressure of 3 mmHg. Comparison(s): Changes from prior study are noted. 11/28/2021: TEE -demonstrated TV vegetations on the septal and anterior leaflets- the septal leaflet vegetation appears to have increased in size. FINDINGS  Left Ventricle: Left ventricular ejection fraction, by estimation, is 60 to 65%. The left ventricle has normal function. The left ventricle has no regional wall motion abnormalities. The left ventricular internal cavity size was normal in size. There is  no left ventricular hypertrophy. Left ventricular diastolic parameters were normal. Right Ventricle: The right ventricular size is normal. No increase in right ventricular wall thickness. Right ventricular systolic function is normal. There is normal pulmonary artery systolic pressure. The tricuspid regurgitant velocity is 2.74 m/s, and  with an assumed right atrial pressure of 3 mmHg, the estimated right ventricular systolic pressure is 33.0 mmHg. Left Atrium: Left atrial size was normal in size. Right Atrium: Right atrial size was normal in size. Pericardium: There is no evidence of pericardial effusion. Mitral Valve: The mitral valve is normal in structure. No evidence of mitral valve regurgitation. No evidence of mitral valve stenosis. Tricuspid Valve: There is a 1.4 x 1.8 cm moible mass associated with the septal tricuspid leaflet, consistent with vegetation. The tricuspid valve is abnormal. Tricuspid valve regurgitation is mild to moderate. No evidence of tricuspid stenosis. Aortic Valve: The aortic valve is tricuspid. Aortic valve regurgitation is not visualized. No aortic stenosis is present.  Aortic valve mean gradient measures 6.0 mmHg. Aortic valve peak gradient measures 11.0 mmHg. Pulmonic Valve: The pulmonic valve was not well visualized. Pulmonic valve regurgitation is not visualized. No evidence of pulmonic stenosis. Aorta: The aortic root is normal in  size and structure. Venous: The inferior vena cava is normal in size with greater than 50% respiratory variability, suggesting right atrial pressure of 3 mmHg. IAS/Shunts: No atrial level shunt detected by color flow Doppler.  LEFT VENTRICLE PLAX 2D LVIDd:         4.70 cm LVIDs:         3.00 cm LV PW:         0.80 cm LV IVS:        0.90 cm  RIGHT VENTRICLE RV S prime:     13.70 cm/s TAPSE (M-mode): 2.9 cm LEFT ATRIUM             Index LA diam:        4.10 cm 2.12 cm/m LA Vol (A2C):   63.0 ml 32.50 ml/m LA Vol (A4C):   42.0 ml 21.67 ml/m LA Biplane Vol: 51.0 ml 26.31 ml/m  AORTIC VALVE AV Vmax:           166.00 cm/s AV Vmean:          112.000 cm/s AV VTI:            0.266 m AV Peak Grad:      11.0 mmHg AV Mean Grad:      6.0 mmHg LVOT Vmax:         130.00 cm/s LVOT Vmean:        87.500 cm/s LVOT VTI:          0.226 m LVOT/AV VTI ratio: 0.85  AORTA Ao Root diam: 3.00 cm Ao Asc diam:  2.30 cm MITRAL VALVE                TRICUSPID VALVE MV Area (PHT): 4.49 cm     TR Peak grad:   30.0 mmHg MV Decel Time: 169 msec     TR Vmax:        274.00 cm/s MV E velocity: 102.00 cm/s MV A velocity: 92.90 cm/s   SHUNTS MV E/A ratio:  1.10         Systemic VTI: 0.23 m Zoila ShutterKenneth Hilty MD Electronically signed by Zoila ShutterKenneth Hilty MD Signature Date/Time: 12/27/2021/6:23:34 PM    Final      LOS: 2 days   Jeoffrey MassedShanker Pranshu Lyster, MD  [facility]    To contact the attending provider between 7A-7P or the covering provider during after hours 7P-7A, please log into the web site [email or web address] and access using universal [place] password for that web site. If you do not have the password, please call the hospital operator.  12/28/2021, 8:54 AM

## 2021-12-28 NOTE — Progress Notes (Signed)
Interventional Radiology Brief Note:  Patient with history of MSSA TV IE with septic embolic s/p angiovac with residual subvalvular vegetation admitted with bacteremia.   MR Thoracic Lumbar Spine 12/27/21: 1. Minimal marrow edema about the left aspect of the T10-11 endplates, early spinal infection is possible given absence on comparison from last month. No spinal canal collection. 2. Negative lumbar spine.  IR consulted for possible disc aspiration/biopsy.  Case reviewed by Dr. Corliss Skains.  There is no fluid collection amenable to aspiration.  Ordering service notified.   Order canceled. Please reconsult IR if needed.   Loyce Dys, MS RD PA-C

## 2021-12-28 NOTE — Progress Notes (Signed)
[facility]  Date of Admission:  12/26/2021      Total days of antibiotics 2   Cefazolin 5/26 >> current         ASSESSMENT: Reginald Clarke is a 31 y.o. male with recurrent MSSA bacteremia in the setting of tricuspid valve endocarditis (S/P Angiovac, 4/26). Completed 2 week course of IV cefazolin followed by oritavancin 5/10, and a dose of dalvance 5/22. 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 TTE with possible larger vegetation on tricuspid valve. Would see if Dr. Cliffton Asters can come see him given recurrent bacteremia if any benefit to repeated angiovac if accessible (some retained last time given ventricular surface). May need a repeat TEE to answer that question.   Will resume cefazolin now that we know susceptibilities. Follow for repeat blood cultures.   Nothing further needed outside of antibiotics for T-spine infection. We discussed with him and his fiance that best course of action would be to continue standard of care with IV antibiotics here in supervised setting for safety given recent injection drug use. He has been clean since discharge.   Anxiety- clonazapam not working well, will reach out to Dr. Jerral Ralph with patient's request and further recs to manage.     PLAN: Continue cefazolin Follow repeat blood cultures Would get Dr. Cliffton Asters back on board with "larger vegetation" Follow back pain for changes    Principal Problem:   Bacteremia with gram positive cocci  Active Problems:   Chronic pain syndrome/methadone abuse    GERD (gastroesophageal reflux disease)   Tobacco abuse   Endocarditis of tricuspid valve   Polysubstance abuse ([name])   Hepatitis C, chronic ([name])   Septic pulmonary embolism ([name])   Anxiety   Normocytic anemia   Prolonged QT interval    enoxaparin (LOVENOX) injection  40 mg Subcutaneous Daily   methadone  135 mg Oral Daily   pantoprazole  40 mg Oral Daily   polyethylene glycol  17 g  Oral Daily   sodium chloride flush  3 mL Intravenous Q12H    SUBJECTIVE: Back pain is still pretty tough. Had a dose of ketorolac and hopeful to continue this.  Requesting changing back of anxiety med to home dose as this works better.  No new concerns.     Review of Systems: Review of Systems  Constitutional:  Negative for chills and fever.  HENT:  Negative for tinnitus.   Eyes:  Negative for blurred vision and photophobia.  Respiratory:  Negative for cough and sputum production.   Cardiovascular:  Negative for chest pain.  Gastrointestinal:  Negative for diarrhea, nausea and vomiting.  Genitourinary:  Negative for dysuria.  Musculoskeletal:  Positive for back pain.  Skin:  Negative for rash.  Neurological:  Negative for headaches.   Allergies  Allergen Reactions   Penicillins Hives   Amoxil [Amoxicillin Trihydrate]     OBJECTIVE: Vitals:   12/27/21 2327 12/28/21 0557 12/28/21 0755 12/28/21 1100  BP: (!) 98/57 (!) 93/58 (!) 93/58 108/67  Pulse: 86 98 95 91  Resp: 16 20 17 19   Temp: 98.7 F (37.1 C) 97.9 F (36.6 C) 98.2 F (36.8 C) 97.7 F (36.5 C)  TempSrc: Oral Oral Oral Oral  SpO2: 98% 100% 97% 98%  Weight:      Height:       Body mass index is 24.11 kg/m.  Physical Exam Vitals reviewed.  Constitutional:      Appearance: Normal appearance.  He is not ill-appearing.  HENT:     Head: Normocephalic.     Mouth/Throat:     Mouth: Mucous membranes are moist.     Pharynx: Oropharynx is clear.  Eyes:     General: No scleral icterus. Cardiovascular:     Rate and Rhythm: Normal rate and regular rhythm.     Heart sounds: Murmur heard.  Pulmonary:     Effort: Pulmonary effort is normal.  Abdominal:     General: There is no distension.     Palpations: Abdomen is soft.  Musculoskeletal:        General: Normal range of motion.     Cervical back: Normal range of motion.  Skin:    Coloration: Skin is not jaundiced or pale.  Neurological:     Mental Status:  He is alert and oriented to person, place, and time.  Psychiatric:        Mood and Affect: Mood normal.        Judgment: Judgment normal.    Lab Results Lab Results  Component Value Date   WBC 6.5 12/27/2021   HGB 9.5 (L) 12/27/2021   HCT 29.9 (L) 12/27/2021   MCV 92.9 12/27/2021   PLT 152 12/27/2021    Lab Results  Component Value Date   CREATININE 0.59 (L) 12/28/2021   BUN <5 (L) 12/28/2021   NA 140 12/28/2021   K 3.7 12/28/2021   CL 105 12/28/2021   CO2 27 12/28/2021    Lab Results  Component Value Date   ALT 29 12/26/2021   AST 28 12/26/2021   ALKPHOS 64 12/26/2021   BILITOT 0.5 12/26/2021     Microbiology: Recent Results (from the past 240 hour(s))  Blood culture (routine single)     Status: Abnormal   Collection Time: 12/24/21  2:27 AM   Specimen: Blood  Result Value Ref Range Status   MICRO NUMBER: [id number]  Final   SPECIMEN QUALITY: Adequate  Final   Source BLOOD 2  Final   STATUS: FINAL  Final   ISOLATE 1: Staphylococcus aureus (AA)  Final    Comment: Staphylococcus aureus Negative for inducible clindamycin resistance. , from aerobic and anaerobic bottles   COMMENT: Aerobic and anaerobic bottle received.  Final      Susceptibility   Staphylococcus aureus - BLOOD CULTURE, POSITIVE 1    VANCOMYCIN 1 Sensitive     CLINDAMYCIN <=0.25 Sensitive     ERYTHROMYCIN >=8 Resistant     GENTAMICIN <=0.5 Sensitive     OXACILLIN* 1 Sensitive      * Oxacillin susceptible staphylococci are susceptible to other penicillinase-stable penicillins (e.g., methicillin, nafcillin), beta- lactam/beta-lactamase inhibitor combinations, and cephems with staphylococcal indications, including cefazolin.     TETRACYCLINE <=1 Sensitive     TRIMETH/SULFA* <=10 Sensitive      * Oxacillin susceptible staphylococci are susceptible to other penicillinase-stable penicillins (e.g., methicillin, nafcillin), beta- lactam/beta-lactamase inhibitor combinations, and cephems with  staphylococcal indications, including cefazolin. Legend: S = Susceptible  I = Intermediate R = Resistant  NS = Not susceptible * = Not tested  NR = Not reported **NN = See antimicrobic comments   Blood culture (routine single)     Status: Abnormal   Collection Time: 12/24/21  2:27 PM   Specimen: Blood  Result Value Ref Range Status   MICRO NUMBER: [id number]  Final   SPECIMEN QUALITY: Adequate  Final   Source BLOOD 1  Final   STATUS: FINAL  Final   ISOLATE 1: Staphylococcus  aureus (AA)  Final    Comment: Staphylococcus aureus Negative for inducible clindamycin resistance. , from aerobic and anaerobic bottles   COMMENT: Aerobic and anaerobic bottle received.  Final      Susceptibility   Staphylococcus aureus - BLOOD CULTURE, POSITIVE 1    VANCOMYCIN 1 Sensitive     CLINDAMYCIN <=0.25 Sensitive     ERYTHROMYCIN >=8 Resistant     GENTAMICIN <=0.5 Sensitive     OXACILLIN* 0.5 Sensitive      * Oxacillin susceptible staphylococci are susceptible to other penicillinase-stable penicillins (e.g., methicillin, nafcillin), beta- lactam/beta-lactamase inhibitor combinations, and cephems with staphylococcal indications, including cefazolin.     TETRACYCLINE <=1 Sensitive     TRIMETH/SULFA* <=10 Sensitive      * Oxacillin susceptible staphylococci are susceptible to other penicillinase-stable penicillins (e.g., methicillin, nafcillin), beta- lactam/beta-lactamase inhibitor combinations, and cephems with staphylococcal indications, including cefazolin. Legend: S = Susceptible  I = Intermediate R = Resistant  NS = Not susceptible * = Not tested  NR = Not reported **NN = See antimicrobic comments   Blood culture (routine x 2)     Status: None (Preliminary result)   Collection Time: 12/26/21 12:17 PM   Specimen: BLOOD RIGHT FOREARM  Result Value Ref Range Status   Specimen Description BLOOD RIGHT FOREARM  Final   Special Requests   Final    AEROBIC BOTTLE ONLY Blood Culture results  may not be optimal due to an inadequate volume of blood received in culture bottles   Culture   Final    NO GROWTH 2 DAYS Performed at [facility], [address] [place], [place] [address]    Report Status PENDING  Incomplete     Rexene AlbertsStephanie Russell Quinney, MSN, NP-C [facility]  [email or web address] Pager: [phone] Office: [phone] [facility] Main Line: [phone] *Secure Chat Communication Welcome

## 2021-12-29 DIAGNOSIS — R7881 Bacteremia: Secondary | ICD-10-CM | POA: Diagnosis not present

## 2021-12-29 DIAGNOSIS — F419 Anxiety disorder, unspecified: Secondary | ICD-10-CM | POA: Diagnosis not present

## 2021-12-29 DIAGNOSIS — K219 Gastro-esophageal reflux disease without esophagitis: Secondary | ICD-10-CM

## 2021-12-29 DIAGNOSIS — G894 Chronic pain syndrome: Secondary | ICD-10-CM | POA: Diagnosis not present

## 2021-12-29 LAB — BASIC METABOLIC PANEL
Anion gap: 7 (ref 5–15)
BUN: 6 mg/dL (ref 6–20)
CO2: 26 mmol/L (ref 22–32)
Calcium: 8.8 mg/dL — ABNORMAL LOW (ref 8.9–10.3)
Chloride: 106 mmol/L (ref 98–111)
Creatinine, Ser: 0.69 mg/dL (ref 0.61–1.24)
GFR, Estimated: >60 mL/min (ref >60)
Glucose, Bld: 107 mg/dL — ABNORMAL HIGH (ref 70–99)
Potassium: 3.9 mmol/L (ref 3.5–5.1)
Sodium: 139 mmol/L (ref 135–145)

## 2021-12-29 LAB — CBC
HCT: 31.6 % — ABNORMAL LOW (ref 39.0–52.0)
Hemoglobin: 10.1 g/dL — ABNORMAL LOW (ref 13.0–17.0)
MCH: 29.6 pg (ref 26.0–34.0)
MCHC: 32 g/dL (ref 30.0–36.0)
MCV: 92.7 fL (ref 80.0–100.0)
Platelets: 196 10*3/uL (ref 150–400)
RBC: 3.41 MIL/uL — ABNORMAL LOW (ref 4.22–5.81)
RDW: 13.9 % (ref 11.5–15.5)
WBC: 4.5 10*3/uL (ref 4.0–10.5)
nRBC: 0 % (ref 0.0–0.2)

## 2021-12-29 LAB — MAGNESIUM: Magnesium: 2 mg/dL (ref 1.7–2.4)

## 2021-12-29 MED ORDER — KETOROLAC TROMETHAMINE 15 MG/ML IJ SOLN
30.0000 mg | Freq: Three times a day (TID) | INTRAMUSCULAR | Status: AC | PRN
  Administered 2021-12-29 – 2022-01-02 (×9): 30 mg via INTRAVENOUS
  Filled 2021-12-29 (×7): qty 2

## 2021-12-29 MED ORDER — POTASSIUM CHLORIDE CRYS ER 20 MEQ PO TBCR
20.0000 meq | EXTENDED_RELEASE_TABLET | Freq: Once | ORAL | Status: AC
  Administered 2021-12-29: 20 meq via ORAL
  Filled 2021-12-29: qty 1

## 2021-12-29 NOTE — Plan of Care (Signed)
  Problem: Health Behavior/Discharge Planning: Goal: Ability to manage health-related needs will improve Outcome: Progressing   Problem: Clinical Measurements: Goal: Will remain free from infection Outcome: Progressing Goal: Respiratory complications will improve Outcome: Progressing Goal: Cardiovascular complication will be avoided Outcome: Progressing   

## 2021-12-29 NOTE — Progress Notes (Signed)
PROGRESS NOTE        PATIENT DETAILS Name: Reginald Clarke Age: 31 y.o. Sex: male Date of Birth: 02-03-1991 Admit Date: 12/26/2021 Admitting Physician Orma Flaming, MD PCP:Pcp, No  Brief Summary: Patient is a 31 y.o.  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  Patient also has been having worsening back pain x1 week prior to this hospitalization.  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  Completed cefazolin on 5/10-planned weekly dalbavancin at the ID clinic.   5/22>> outpatient blood culture positive for MSSA 5/24>> admit to [facility] for persistent bacteremia-worsening back pain.  Significant studies: 4/13>> MRI thoracic/MRI lumbar spine: No obvious discitis/osteomyelitis. 4/21>> TEE: EF 60-65%, tricuspid valve vegetation. 5/24>> CTA chest: Cavitary nodule RLL-additional scattered nodules.  No PE. 5/25>> orthopantogram: No periapical abscesses. 5/25>> MRI T-spine: Edema T10-11 endplates-early spinal infection is possible. 5/25>> MRI L-spine: Negative for infection. 5/25>>TTE: EF 60-65%, 1.4 x 1.8 cm mobile mass on the septal tricuspid leaflet.  Significant microbiology data: 4/09>> blood culture: MSSA 4/12>> blood culture: No growth 5/22>> blood culture: MSSA 5/24>> blood culture: No growth  Procedures: 4/26>> debridement of right atrial mass/tricuspid valve vegetation by cardiothoracic surgery.  Consults: ID, IR, cardiothoracic surgery  Subjective: Back pain relatively stable.  No other complaints.  Objective: Vitals: Blood pressure 98/63, pulse 83, temperature 97.8 F (36.6 C), temperature source Oral, resp. rate  20, height 5\' 10"  (1.778 m), weight 76.2 kg, SpO2 96 %.   Exam: Gen Exam:Alert awake-not in any distress HEENT:atraumatic, normocephalic Chest: B/L clear to auscultation anteriorly CVS:S1S2 regular Abdomen:soft non tender, non distended Extremities:no edema Neurology: Non focal Skin: no rash   Pertinent Labs/Radiology:    Latest Ref Rng & Units 12/29/2021    4:42 AM 12/27/2021    3:32 AM 12/26/2021   12:16 PM  CBC  WBC 4.0 - 10.5 K/uL 4.5   6.5   7.5    Hemoglobin 13.0 - 17.0 g/dL 10.1   9.5   11.1    Hematocrit 39.0 - 52.0 % 31.6   29.9   34.5    Platelets 150 - 400 K/uL 196   152   144      Lab Results  Component Value Date   NA 139 12/29/2021   K 3.9 12/29/2021   CL 106 12/29/2021   CO2 26 12/29/2021     Assessment/Plan: Persistent MSSA bacteremia with native tricuspid valve endocarditis-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  Awaiting further recommendation from ID-awaiting reevaluation from cardiothoracic surgery.    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  No concerning findings on physical exam.    Chronic pain syndrome: Continue methadone-on Percocet/Toradol for breakthrough pain.    Prolonged QTc: QTc normalized as of EKG on 5/27.  Watch closely-as patient on high-dose methadone.  Attempt to keep K > 4, Mg > 2.    Anxiety disorder: As needed benzodiazepines-stable this morning.  Chronic hepatitis C: Treatment deferred to the outpatient setting.  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  Tobacco use: Counseled  BMI: Estimated body mass index is 24.11 kg/m as calculated from the following:   Height as of this encounter: 5\' 10"  (1.778 m).   Weight as of this encounter: 76.2 kg.   Code status:   Code Status: Full Code   DVT Prophylaxis: enoxaparin  (LOVENOX) injection 40 mg Start: 12/28/21 1100 SCDs Start: 12/26/21 1724   Family Communication: None at bedside-he will update family members himself-he does not want me calling any particular family member.   Disposition Plan: Status is: Inpatient Remains inpatient appropriate because: Persistent MSSA bacteremia-not yet stable for discharge.   Planned Discharge Destination:Home   Diet: Diet Order             Diet regular Room service appropriate? Yes; Fluid consistency: Thin  Diet effective now                     Antimicrobial agents: Anti-infectives (From admission, onward)    Start     Dose/Rate Route Frequency Ordered Stop   12/28/21 2200  ceFAZolin (ANCEF) IVPB 2g/100 mL premix        2 g 200 mL/hr over 30 Minutes Intravenous Every 8 hours 12/28/21 1352     12/27/21 0200  vancomycin (VANCOREADY) IVPB 1750 mg/350 mL  Status:  Discontinued        1,750 mg 175 mL/hr over 120 Minutes Intravenous Every 12 hours 12/26/21 1413 12/28/21 1352   12/26/21 1400  vancomycin (VANCOREADY) IVPB 1750 mg/350 mL        1,750 mg 175 mL/hr over 120 Minutes Intravenous  Once 12/26/21 1354 12/26/21 1645        MEDICATIONS: Scheduled Meds:  enoxaparin (LOVENOX) injection  40 mg Subcutaneous Daily   methadone  135 mg Oral Daily   pantoprazole  40 mg Oral Daily   polyethylene glycol  17 g Oral Daily   sodium chloride flush  3 mL Intravenous Q12H   Continuous Infusions:  sodium chloride      ceFAZolin (ANCEF) IV 2 g (12/29/21 0506)   PRN Meds:.sodium chloride, acetaminophen **OR** acetaminophen, ALPRAZolam, HYDROcodone-acetaminophen, ketorolac, methocarbamol, sodium chloride flush   I have personally reviewed following labs and imaging studies  LABORATORY DATA: CBC: Recent Labs  Lab 12/22/21 2025 12/24/21 1427 12/26/21 1216 12/27/21 0332 12/29/21 0442  WBC 9.4 7.2 7.5 6.5 4.5  NEUTROABS 7.7  --  5.0  --   --   HGB 11.0* 10.5* 11.1* 9.5* 10.1*  HCT 34.9* 32.4*  34.5* 29.9* 31.6*  MCV 94.8 89.8 94.3 92.9 92.7  PLT 129* 112* 144* 152 196     Basic Metabolic Panel: Recent Labs  Lab 12/22/21 2025 12/26/21 1216 12/26/21 1809 12/27/21 0332 12/28/21 0237 12/29/21 0442  NA 136 138  --  135 140 139  K 3.8 3.9  --  3.6 3.7 3.9  CL 101 103  --  102 105 106  CO2 25 25  --  26 27 26   GLUCOSE 117* 88  --  90 108* 107*  BUN 11 <5*  --  5* <5* 6  CREATININE 0.97 0.63  --  0.69 0.59* 0.69  CALCIUM 9.0 8.8*  --  8.2* 8.5* 8.8*  MG  --   --  2.0  --  2.0 2.0     GFR: Estimated Creatinine Clearance: 139.4 mL/min (by C-G formula based on SCr of 0.69 mg/dL).  Liver Function Tests: Recent Labs  Lab 12/22/21 2025 12/24/21 1427 12/26/21 1216  AST 28 22 28  ALT 29 24 29   ALKPHOS 62  --  64  BILITOT 0.8 0.5 0.5  PROT 7.0 6.9 7.3  ALBUMIN 3.2*  --  3.1*    No results for input(s): LIPASE, AMYLASE in the last 168 hours. No results for input(s): AMMONIA in the last 168 hours.  Coagulation Profile: Recent Labs  Lab 12/24/21 1427  INR 1.2*     Cardiac Enzymes: No results for input(s): CKTOTAL, CKMB, CKMBINDEX, TROPONINI in the last 168 hours.  BNP (last 3 results) No results for input(s): PROBNP in the last 8760 hours.  Lipid Profile: No results for input(s): CHOL, HDL, LDLCALC, TRIG, CHOLHDL, LDLDIRECT in the last 72 hours.  Thyroid Function Tests: Recent Labs    12/26/21 1809  TSH 2.125     Anemia Panel: Recent Labs    12/26/21 1809  FERRITIN 131  TIBC 214*  IRON 23*     Urine analysis:    Component Value Date/Time   COLORURINE YELLOW 12/26/2021 1106   APPEARANCEUR CLEAR 12/26/2021 1106   LABSPEC 1.015 12/26/2021 1106   PHURINE 6.0 12/26/2021 1106   GLUCOSEU NEGATIVE 12/26/2021 1106   [place] 12/26/2021 1106   [place] 12/26/2021 1106   [place] 12/26/2021 1106   PROTEINUR NEGATIVE 12/26/2021 1106   NITRITE NEGATIVE 12/26/2021 1106   LEUKOCYTESUR NEGATIVE 12/26/2021 1106     Sepsis Labs: Lactic Acid, Venous    Component Value Date/Time   LATICACIDVEN 1.4 12/26/2021 1412    MICROBIOLOGY: Recent Results (from the past 240 hour(s))  Blood culture (routine single)     Status: Abnormal   Collection Time: 12/24/21  2:27 AM   Specimen: Blood  Result Value Ref Range Status   MICRO NUMBER: [id number]  Final   SPECIMEN QUALITY: Adequate  Final   Source BLOOD 2  Final   STATUS: FINAL  Final   ISOLATE 1: Staphylococcus aureus (AA)  Final    Comment: Staphylococcus aureus Negative for inducible clindamycin resistance. , from aerobic and anaerobic bottles   COMMENT: Aerobic and anaerobic bottle received.  Final      Susceptibility   Staphylococcus aureus - BLOOD CULTURE, POSITIVE 1    VANCOMYCIN 1 Sensitive     CLINDAMYCIN <=0.25 Sensitive     ERYTHROMYCIN >=8 Resistant     GENTAMICIN <=0.5 Sensitive     OXACILLIN* 1 Sensitive      * Oxacillin susceptible staphylococci are susceptible to other penicillinase-stable penicillins (e.g., methicillin, nafcillin), beta- lactam/beta-lactamase inhibitor combinations, and cephems with staphylococcal indications, including cefazolin.     TETRACYCLINE <=1 Sensitive     TRIMETH/SULFA* <=10 Sensitive      * Oxacillin susceptible staphylococci are susceptible to other penicillinase-stable penicillins (e.g., methicillin, nafcillin), beta- lactam/beta-lactamase inhibitor combinations, and cephems with staphylococcal indications, including cefazolin. Legend: S = Susceptible  I = Intermediate R = Resistant  NS = Not susceptible * = Not tested  NR = Not reported **NN = See antimicrobic comments   Blood culture (routine single)     Status: Abnormal   Collection Time: 12/24/21  2:27 PM   Specimen: Blood  Result Value Ref Range Status   MICRO NUMBER: [id number]  Final   SPECIMEN QUALITY: Adequate  Final   Source BLOOD 1  Final   STATUS: FINAL  Final   ISOLATE 1: Staphylococcus aureus (AA)  Final    Comment:  Staphylococcus aureus Negative for inducible clindamycin resistance. , from aerobic and anaerobic bottles   COMMENT: Aerobic and anaerobic bottle received.  Final  Susceptibility   Staphylococcus aureus - BLOOD CULTURE, POSITIVE 1    VANCOMYCIN 1 Sensitive     CLINDAMYCIN <=0.25 Sensitive     ERYTHROMYCIN >=8 Resistant     GENTAMICIN <=0.5 Sensitive     OXACILLIN* 0.5 Sensitive      * Oxacillin susceptible staphylococci are susceptible to other penicillinase-stable penicillins (e.g., methicillin, nafcillin), beta- lactam/beta-lactamase inhibitor combinations, and cephems with staphylococcal indications, including cefazolin.     TETRACYCLINE <=1 Sensitive     TRIMETH/SULFA* <=10 Sensitive      * Oxacillin susceptible staphylococci are susceptible to other penicillinase-stable penicillins (e.g., methicillin, nafcillin), beta- lactam/beta-lactamase inhibitor combinations, and cephems with staphylococcal indications, including cefazolin. Legend: S = Susceptible  I = Intermediate R = Resistant  NS = Not susceptible * = Not tested  NR = Not reported **NN = See antimicrobic comments   Blood culture (routine x 2)     Status: None (Preliminary result)   Collection Time: 12/26/21 12:17 PM   Specimen: BLOOD RIGHT FOREARM  Result Value Ref Range Status   Specimen Description BLOOD RIGHT FOREARM  Final   Special Requests   Final    AEROBIC BOTTLE ONLY Blood Culture results may not be optimal due to an inadequate volume of blood received in culture bottles   Culture   Final    NO GROWTH 3 DAYS Performed at [facility], [place] [address] [place], [place] [address]    Report Status PENDING  Incomplete    RADIOLOGY STUDIES/RESULTS: DG Orthopantogram  Result Date: 12/27/2021 CLINICAL DATA:  Bacteremia EXAM: ORTHOPANTOGRAM/PANORAMIC COMPARISON:  None Available. FINDINGS: Multiple missing mandibular and maxillary teeth. Multiple remaining teeth demonstrate dental caries. No  visible periapical abscess. No acute bony abnormality. IMPRESSION: Multiple missing teeth with multiple dental caries within the remaining teeth. No periapical abscess. Electronically Signed   By: Rolm Baptise M.D.   On: 12/27/2021 19:15   MR THORACIC SPINE W WO CONTRAST  Result Date: 12/27/2021 CLINICAL DATA:  Low back pain with infection suspected. Positive x-ray/CT EXAM: MRI THORACIC AND LUMBAR SPINE WITHOUT AND WITH CONTRAST TECHNIQUE: Multiplanar and multiecho pulse sequences of the thoracic and lumbar spine were obtained without and with intravenous contrast. CONTRAST:  9mL GADAVIST GADOBUTROL 1 MMOL/ML IV SOLN COMPARISON:  11/15/2021 FINDINGS: MRI THORACIC SPINE FINDINGS Alignment:  Physiologic. Vertebrae: Mild marrow edema leftward at the level of the T10-11 endplate with mild adjacent paravertebral edema/enhancement. No disc collapse and only minimal associated disc T2 hyperintensity. Cord: Normal signal and morphology. No evidence of spinal canal collection. Paraspinal and other soft tissues: Trace bilateral pleural effusion. Nodular opacities in the bilateral lungs, recently evaluated by CT. Disc levels: Small disc protrusions at T5-6, T6-7, and T8-9. Negative facets. No neural impingement. MRI LUMBAR SPINE FINDINGS Segmentation:  Standard. Alignment:  Physiologic. Vertebrae:  No fracture, evidence of discitis, or bone lesion. Conus medullaris: Extends to the L1-2 level and appears normal. Paraspinal and other soft tissues: Negative for perispinal mass or inflammation Disc levels: Diffusely preserved disc height and hydration. No facet spurring. No neural impingement. IMPRESSION: 1. Minimal marrow edema about the left aspect of the T10-11 endplates, early spinal infection is possible given absence on comparison from last month. No spinal canal collection. 2. Negative lumbar spine. Electronically Signed   By: Jorje Guild M.D.   On: 12/27/2021 13:25   MR Lumbar Spine W Wo Contrast  Result Date:  12/27/2021 CLINICAL DATA:  Low back pain with infection suspected. Positive x-ray/CT EXAM: MRI THORACIC AND  LUMBAR SPINE WITHOUT AND WITH CONTRAST TECHNIQUE: Multiplanar and multiecho pulse sequences of the thoracic and lumbar spine were obtained without and with intravenous contrast. CONTRAST:  43mL GADAVIST GADOBUTROL 1 MMOL/ML IV SOLN COMPARISON:  11/15/2021 FINDINGS: MRI THORACIC SPINE FINDINGS Alignment:  Physiologic. Vertebrae: Mild marrow edema leftward at the level of the T10-11 endplate with mild adjacent paravertebral edema/enhancement. No disc collapse and only minimal associated disc T2 hyperintensity. Cord: Normal signal and morphology. No evidence of spinal canal collection. Paraspinal and other soft tissues: Trace bilateral pleural effusion. Nodular opacities in the bilateral lungs, recently evaluated by CT. Disc levels: Small disc protrusions at T5-6, T6-7, and T8-9. Negative facets. No neural impingement. MRI LUMBAR SPINE FINDINGS Segmentation:  Standard. Alignment:  Physiologic. Vertebrae:  No fracture, evidence of discitis, or bone lesion. Conus medullaris: Extends to the L1-2 level and appears normal. Paraspinal and other soft tissues: Negative for perispinal mass or inflammation Disc levels: Diffusely preserved disc height and hydration. No facet spurring. No neural impingement. IMPRESSION: 1. Minimal marrow edema about the left aspect of the T10-11 endplates, early spinal infection is possible given absence on comparison from last month. No spinal canal collection. 2. Negative lumbar spine. Electronically Signed   By: Jorje Guild M.D.   On: 12/27/2021 13:25   ECHOCARDIOGRAM COMPLETE BUBBLE STUDY  Result Date: 12/27/2021    ECHOCARDIOGRAM REPORT   Patient Name:   Reginald Clarke Date of Exam: 12/27/2021 Medical Rec #:  [id number]    Height:       70.0 in Accession #:    [id number]   Weight:       168.0 lb Date of Birth:  Dec 30, 1990    BSA:          1.938 m Patient Age:    30 years     BP:            126/77 mmHg Patient Gender: M            HR:           101 bpm. Exam Location:  Inpatient Procedure: 2D Echo, Cardiac Doppler and Color Doppler Indications:    Bacteremia  History:        Patient has prior history of Echocardiogram examinations, most                 recent 11/28/2021. H/O angiovac for TV vegetation.  Sonographer:    Merrie Roof RDCS Referring Phys: [id number] [place]  1. Left ventricular ejection fraction, by estimation, is 60 to 65%. The left ventricle has normal function. The left ventricle has no regional wall motion abnormalities. Left ventricular diastolic parameters were normal.  2. Right ventricular systolic function is normal. The right ventricular size is normal. There is normal pulmonary artery systolic pressure. The estimated right ventricular systolic pressure is 123456 mmHg.  3. The mitral valve is normal in structure. No evidence of mitral valve regurgitation. No evidence of mitral stenosis.  4. There is a 1.4 x 1.8 cm moible mass associated with the septal tricuspid leaflet, consistent with vegetation. The tricuspid valve is abnormal. Tricuspid valve regurgitation is mild to moderate.  5. The aortic valve is tricuspid. Aortic valve regurgitation is not visualized. No aortic stenosis is present.  6. The inferior vena cava is normal in size with greater than 50% respiratory variability, suggesting right atrial pressure of 3 mmHg. Comparison(s): Changes from prior study are noted. 11/28/2021: TEE -demonstrated TV vegetations on the septal and anterior leaflets- the septal leaflet  vegetation appears to have increased in size. FINDINGS  Left Ventricle: Left ventricular ejection fraction, by estimation, is 60 to 65%. The left ventricle has normal function. The left ventricle has no regional wall motion abnormalities. The left ventricular internal cavity size was normal in size. There is  no left ventricular hypertrophy. Left ventricular diastolic parameters were normal.  Right Ventricle: The right ventricular size is normal. No increase in right ventricular wall thickness. Right ventricular systolic function is normal. There is normal pulmonary artery systolic pressure. The tricuspid regurgitant velocity is 2.74 m/s, and  with an assumed right atrial pressure of 3 mmHg, the estimated right ventricular systolic pressure is 123456 mmHg. Left Atrium: Left atrial size was normal in size. Right Atrium: Right atrial size was normal in size. Pericardium: There is no evidence of pericardial effusion. Mitral Valve: The mitral valve is normal in structure. No evidence of mitral valve regurgitation. No evidence of mitral valve stenosis. Tricuspid Valve: There is a 1.4 x 1.8 cm moible mass associated with the septal tricuspid leaflet, consistent with vegetation. The tricuspid valve is abnormal. Tricuspid valve regurgitation is mild to moderate. No evidence of tricuspid stenosis. Aortic Valve: The aortic valve is tricuspid. Aortic valve regurgitation is not visualized. No aortic stenosis is present. Aortic valve mean gradient measures 6.0 mmHg. Aortic valve peak gradient measures 11.0 mmHg. Pulmonic Valve: The pulmonic valve was not well visualized. Pulmonic valve regurgitation is not visualized. No evidence of pulmonic stenosis. Aorta: The aortic root is normal in size and structure. Venous: The inferior vena cava is normal in size with greater than 50% respiratory variability, suggesting right atrial pressure of 3 mmHg. IAS/Shunts: No atrial level shunt detected by color flow Doppler.  LEFT VENTRICLE PLAX 2D LVIDd:         4.70 cm LVIDs:         3.00 cm LV PW:         0.80 cm LV IVS:        0.90 cm  RIGHT VENTRICLE RV S prime:     13.70 cm/s TAPSE (M-mode): 2.9 cm LEFT ATRIUM             Index LA diam:        4.10 cm 2.12 cm/m LA Vol (A2C):   63.0 ml 32.50 ml/m LA Vol (A4C):   42.0 ml 21.67 ml/m LA Biplane Vol: 51.0 ml 26.31 ml/m  AORTIC VALVE AV Vmax:           166.00 cm/s AV Vmean:           112.000 cm/s AV VTI:            0.266 m AV Peak Grad:      11.0 mmHg AV Mean Grad:      6.0 mmHg LVOT Vmax:         130.00 cm/s LVOT Vmean:        87.500 cm/s LVOT VTI:          0.226 m LVOT/AV VTI ratio: 0.85  AORTA Ao Root diam: 3.00 cm Ao Asc diam:  2.30 cm MITRAL VALVE                TRICUSPID VALVE MV Area (PHT): 4.49 cm     TR Peak grad:   30.0 mmHg MV Decel Time: 169 msec     TR Vmax:        274.00 cm/s MV E velocity: 102.00 cm/s MV A velocity: 92.90 cm/s   SHUNTS MV  E/A ratio:  1.10         Systemic VTI: 0.23 m Lyman Bishop MD Electronically signed by Lyman Bishop MD Signature Date/Time: 12/27/2021/6:23:34 PM    Final      LOS: 3 days   Oren Binet, MD  [facility]    To contact the attending provider between 7A-7P or the covering provider during after hours 7P-7A, please log into the web site [email or web address] and access using universal [place] password for that web site. If you do not have the password, please call the hospital operator.  12/29/2021, 10:46 AM

## 2021-12-30 DIAGNOSIS — R7881 Bacteremia: Secondary | ICD-10-CM | POA: Diagnosis not present

## 2021-12-30 NOTE — Progress Notes (Signed)
Pt requesting to be allowed to have a shower; explained we will need MD approval to support this.  Will ask about this on day shift.

## 2021-12-30 NOTE — Progress Notes (Signed)
PROGRESS NOTE        PATIENT DETAILS Name: Reginald Clarke Age: 31 y.o. Sex: male Date of Birth: Aug 17, 1990 Admit Date: 12/26/2021 Admitting Physician Orland Mustard, MD PCP:Pcp, No  Brief Summary: Patient is a 31 y.o.  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  Patient also has been having worsening back pain x1 week prior to this hospitalization.  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  Completed cefazolin on 5/10-planned weekly dalbavancin at the ID clinic.   5/22>> outpatient blood culture positive for MSSA 5/24>> admit to [facility] for persistent bacteremia-worsening back pain.  Significant studies: 4/13>> MRI thoracic/MRI lumbar spine: No obvious discitis/osteomyelitis. 4/21>> TEE: EF 60-65%, tricuspid valve vegetation. 5/24>> CTA chest: Cavitary nodule RLL-additional scattered nodules.  No PE. 5/25>> orthopantogram: No periapical abscesses. 5/25>> MRI T-spine: Edema T10-11 endplates-early spinal infection is possible. 5/25>> MRI L-spine: Negative for infection. 5/25>>TTE: EF 60-65%, 1.4 x 1.8 cm mobile mass on the septal tricuspid leaflet.  Significant microbiology data: 4/09>> blood culture: MSSA 4/12>> blood culture: No growth 5/22>> blood culture: MSSA 5/24>> blood culture: No growth  Procedures: 4/26>> debridement of right atrial mass/tricuspid valve vegetation by cardiothoracic surgery.  Consults: ID, IR, cardiothoracic surgery  Subjective: Lying comfortably in bed-no major issues overnight.  Anxiety/back pain well controlled with current regimen.  Objective: Vitals: Blood pressure 100/61, pulse 84,  temperature 97.8 F (36.6 C), temperature source Oral, resp. rate 17, height 5\' 10"  (1.778 m), weight 76.2 kg, SpO2 98 %.   Exam: Gen Exam:Alert awake-not in any distress HEENT:atraumatic, normocephalic Chest: B/L clear to auscultation anteriorly CVS:S1S2 regular Abdomen:soft non tender, non distended Extremities:no edema Neurology: Non focal Skin: no rash   Pertinent Labs/Radiology:    Latest Ref Rng & Units 12/29/2021    4:42 AM 12/27/2021    3:32 AM 12/26/2021   12:16 PM  CBC  WBC 4.0 - 10.5 K/uL 4.5   6.5   7.5    Hemoglobin 13.0 - 17.0 g/dL 12/28/2021   9.5   09.8    Hematocrit 39.0 - 52.0 % 31.6   29.9   34.5    Platelets 150 - 400 K/uL 196   152   144      Lab Results  Component Value Date   NA 139 12/29/2021   K 3.9 12/29/2021   CL 106 12/29/2021   CO2 26 12/29/2021     Assessment/Plan: Persistent MSSA bacteremia with native tricuspid valve endocarditis-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  Awaiting further recommendation from ID-awaiting reevaluation from cardiothoracic surgery.    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  No concerning findings on physical exam.    Chronic pain syndrome: Continue methadone-on Percocet/Toradol for breakthrough pain.    Prolonged QTc: QTc normalized as of EKG on 5/27.  Watch closely-as patient on high-dose methadone.  Attempt to keep K > 4, Mg > 2.    Anxiety disorder: As needed benzodiazepines-stable this morning.  Chronic hepatitis C: Treatment deferred to the outpatient setting.  GERD: Continue PPI  History of IV methamphetamine  use: Claims abstinence since last hospitalization.  Tobacco use: Counseled  BMI: Estimated body mass index is 24.11 kg/m as calculated from the following:   Height as of this encounter:  (1.778 m).   Weight as of this encounter: 76.2 kg.   Code  status:   Code Status: Full Code   DVT Prophylaxis: enoxaparin (LOVENOX) injection 40 mg Start: 12/28/21 1100 SCDs Start: 12/26/21 1724   Family Communication: None at bedside-he will update family members himself-he does not want me calling any particular family member.   Disposition Plan: Status is: Inpatient Remains inpatient appropriate because: Persistent MSSA bacteremia-not yet stable for discharge.   Planned Discharge Destination:Home when completes course of IV antibiotics.  Not a candidate for outpatient IV antibiotics.   Diet: Diet Order             Diet regular Room service appropriate? Yes; Fluid consistency: Thin  Diet effective now                     Antimicrobial agents: Anti-infectives (From admission, onward)    Start     Dose/Rate Route Frequency Ordered Stop   12/28/21 2200  ceFAZolin (ANCEF) IVPB 2g/100 mL premix        2 g 200 mL/hr over 30 Minutes Intravenous Every 8 hours 12/28/21 1352     12/27/21 0200  vancomycin (VANCOREADY) IVPB 1750 mg/350 mL  Status:  Discontinued        1,750 mg 175 mL/hr over 120 Minutes Intravenous Every 12 hours 12/26/21 1413 12/28/21 1352   12/26/21 1400  vancomycin (VANCOREADY) IVPB 1750 mg/350 mL        1,750 mg 175 mL/hr over 120 Minutes Intravenous  Once 12/26/21 1354 12/26/21 1645        MEDICATIONS: Scheduled Meds:  enoxaparin (LOVENOX) injection  40 mg Subcutaneous Daily   methadone  135 mg Oral Daily   pantoprazole  40 mg Oral Daily   polyethylene glycol  17 g Oral Daily   sodium chloride flush  3 mL Intravenous Q12H   Continuous Infusions:  sodium chloride      ceFAZolin (ANCEF) IV 2 g (12/30/21 0619)   PRN Meds:.sodium chloride, acetaminophen **OR** acetaminophen, ALPRAZolam, HYDROcodone-acetaminophen, ketorolac, methocarbamol, sodium chloride flush   I have personally reviewed following labs and imaging studies  LABORATORY DATA: CBC: Recent Labs  Lab 12/24/21 1427 12/26/21 1216  12/27/21 0332 12/29/21 0442  WBC 7.2 7.5 6.5 4.5  NEUTROABS  --  5.0  --   --   HGB 10.5* 11.1* 9.5* 10.1*  HCT 32.4* 34.5* 29.9* 31.6*  MCV 89.8 94.3 92.9 92.7  PLT 112* 144* 152 196     Basic Metabolic Panel: Recent Labs  Lab 12/26/21 1216 12/26/21 1809 12/27/21 0332 12/28/21 0237 12/29/21 0442  NA 138  --  135 140 139  K 3.9  --  3.6 3.7 3.9  CL 103  --  102 105 106  CO2 25  --  GLUCOSE 88  --  90 108* 107*  BUN <5*  --  5* <5* 6  CREATININE 0.63  --  0.69 0.59* 0.69  CALCIUM 8.8*  --  8.2* 8.5* 8.8*  MG  --  2.0  --  2.0 2.0     GFR: Estimated Creatinine Clearance: 139.4 mL/min (by C-G formula based on SCr of 0.69 mg/dL).  Liver Function Tests: Recent Labs  Lab 12/24/21 1427 12/26/21 1216  AST 22 28  ALT 24  29  ALKPHOS  --  64  BILITOT 0.5 0.5  PROT 6.9 7.3  ALBUMIN  --  3.1*    No results for input(s): LIPASE, AMYLASE in the last 168 hours. No results for input(s): AMMONIA in the last 168 hours.  Coagulation Profile: Recent Labs  Lab 12/24/21 1427  INR 1.2*     Cardiac Enzymes: No results for input(s): CKTOTAL, CKMB, CKMBINDEX, TROPONINI in the last 168 hours.  BNP (last 3 results) No results for input(s): PROBNP in the last 8760 hours.  Lipid Profile: No results for input(s): CHOL, HDL, LDLCALC, TRIG, CHOLHDL, LDLDIRECT in the last 72 hours.  Thyroid Function Tests: No results for input(s): TSH, T4TOTAL, FREET4, T3FREE, THYROIDAB in the last 72 hours.   Anemia Panel: No results for input(s): VITAMINB12, FOLATE, FERRITIN, TIBC, IRON, RETICCTPCT in the last 72 hours.   Urine analysis:    Component Value Date/Time   COLORURINE YELLOW 12/26/2021 1106   APPEARANCEUR CLEAR 12/26/2021 1106   LABSPEC 1.015 12/26/2021 1106   PHURINE 6.0 12/26/2021 1106   GLUCOSEU NEGATIVE 12/26/2021 1106   HGBUR NEGATIVE 12/26/2021 1106   BILIRUBINUR NEGATIVE 12/26/2021 1106   KETONESUR NEGATIVE 12/26/2021 1106   PROTEINUR NEGATIVE  12/26/2021 1106   NITRITE NEGATIVE 12/26/2021 1106   LEUKOCYTESUR NEGATIVE 12/26/2021 1106    Sepsis Labs: Lactic Acid, Venous    Component Value Date/Time   LATICACIDVEN 1.4 12/26/2021 1412    MICROBIOLOGY: Recent Results (from the past 240 hour(s))  Blood culture (routine single)     Status: Abnormal   Collection Time: 12/24/21  2:27 AM   Specimen: Blood  Result Value Ref Range Status   MICRO NUMBER: [id number]  Final   SPECIMEN QUALITY: Adequate  Final   Source BLOOD 2  Final   STATUS: FINAL  Final   ISOLATE 1: Staphylococcus aureus (AA)  Final    Comment: Staphylococcus aureus Negative for inducible clindamycin resistance. , from aerobic and anaerobic bottles   COMMENT: Aerobic and anaerobic bottle received.  Final      Susceptibility   Staphylococcus aureus - BLOOD CULTURE, POSITIVE 1    VANCOMYCIN 1 Sensitive     CLINDAMYCIN <=0.25 Sensitive     ERYTHROMYCIN >=8 Resistant     GENTAMICIN <=0.5 Sensitive     OXACILLIN* 1 Sensitive      * Oxacillin susceptible staphylococci are susceptible to other penicillinase-stable penicillins (e.g., methicillin, nafcillin), beta- lactam/beta-lactamase inhibitor combinations, and cephems with staphylococcal indications, including cefazolin.     TETRACYCLINE <=1 Sensitive     TRIMETH/SULFA* <=10 Sensitive      * Oxacillin susceptible staphylococci are susceptible to other penicillinase-stable penicillins (e.g., methicillin, nafcillin), beta- lactam/beta-lactamase inhibitor combinations, and cephems with staphylococcal indications, including cefazolin. Legend: S = Susceptible  I = Intermediate R = Resistant  NS = Not susceptible * = Not tested  NR = Not reported **NN = See antimicrobic comments   Blood culture (routine single)     Status: Abnormal   Collection Time: 12/24/21  2:27 PM   Specimen: Blood  Result Value Ref Range Status   MICRO NUMBER: [id number]  Final   SPECIMEN QUALITY: Adequate  Final   Source BLOOD 1  Final    STATUS: FINAL  Final   ISOLATE 1: Staphylococcus aureus (AA)  Final    Comment: Staphylococcus aureus Negative for inducible clindamycin resistance. , from aerobic and anaerobic bottles   COMMENT: Aerobic and anaerobic bottle received.  Final      Susceptibility   Staphylococcus  aureus - BLOOD CULTURE, POSITIVE 1    VANCOMYCIN 1 Sensitive     CLINDAMYCIN <=0.25 Sensitive     ERYTHROMYCIN >=8 Resistant     GENTAMICIN <=0.5 Sensitive     OXACILLIN* 0.5 Sensitive      * Oxacillin susceptible staphylococci are susceptible to other penicillinase-stable penicillins (e.g., methicillin, nafcillin), beta- lactam/beta-lactamase inhibitor combinations, and cephems with staphylococcal indications, including cefazolin.     TETRACYCLINE <=1 Sensitive     TRIMETH/SULFA* <=10 Sensitive      * Oxacillin susceptible staphylococci are susceptible to other penicillinase-stable penicillins (e.g., methicillin, nafcillin), beta- lactam/beta-lactamase inhibitor combinations, and cephems with staphylococcal indications, including cefazolin. Legend: S = Susceptible  I = Intermediate R = Resistant  NS = Not susceptible * = Not tested  NR = Not reported **NN = See antimicrobic comments   Blood culture (routine x 2)     Status: None (Preliminary result)   Collection Time: 12/26/21 12:17 PM   Specimen: BLOOD RIGHT FOREARM  Result Value Ref Range Status   Specimen Description BLOOD RIGHT FOREARM  Final   Special Requests   Final    AEROBIC BOTTLE ONLY Blood Culture results may not be optimal due to an inadequate volume of blood received in culture bottles   Culture   Final    NO GROWTH 3 DAYS Performed at [facility], [address] [place], [place] [address]    Report Status PENDING  Incomplete    RADIOLOGY STUDIES/RESULTS: No results found.   LOS: 4 days   Jeoffrey MassedShanker Kaisy Severino, MD  [facility]    To contact the attending provider between 7A-7P or the covering provider during  after hours 7P-7A, please log into the web site [email or web address] and access using universal [place] password for that web site. If you do not have the password, please call the hospital operator.  12/30/2021, 10:16 AM

## 2021-12-31 DIAGNOSIS — R7881 Bacteremia: Secondary | ICD-10-CM | POA: Diagnosis not present

## 2021-12-31 LAB — CBC
HCT: 29.7 % — ABNORMAL LOW (ref 39.0–52.0)
Hemoglobin: 9.7 g/dL — ABNORMAL LOW (ref 13.0–17.0)
MCH: 29.8 pg (ref 26.0–34.0)
MCHC: 32.7 g/dL (ref 30.0–36.0)
MCV: 91.4 fL (ref 80.0–100.0)
Platelets: 245 10*3/uL (ref 150–400)
RBC: 3.25 MIL/uL — ABNORMAL LOW (ref 4.22–5.81)
RDW: 13.9 % (ref 11.5–15.5)
WBC: 5.9 10*3/uL (ref 4.0–10.5)
nRBC: 0 % (ref 0.0–0.2)

## 2021-12-31 LAB — BASIC METABOLIC PANEL
Anion gap: 5 (ref 5–15)
BUN: 5 mg/dL — ABNORMAL LOW (ref 6–20)
CO2: 28 mmol/L (ref 22–32)
Calcium: 8.7 mg/dL — ABNORMAL LOW (ref 8.9–10.3)
Chloride: 106 mmol/L (ref 98–111)
Creatinine, Ser: 0.85 mg/dL (ref 0.61–1.24)
GFR, Estimated: >60 mL/min (ref >60)
Glucose, Bld: 91 mg/dL (ref 70–99)
Potassium: 3.9 mmol/L (ref 3.5–5.1)
Sodium: 139 mmol/L (ref 135–145)

## 2021-12-31 LAB — CULTURE, BLOOD (ROUTINE X 2): Culture: NO GROWTH

## 2021-12-31 LAB — MAGNESIUM: Magnesium: 1.8 mg/dL (ref 1.7–2.4)

## 2021-12-31 MED ORDER — MAGNESIUM SULFATE IN D5W 1-5 GM/100ML-% IV SOLN
1.0000 g | Freq: Once | INTRAVENOUS | Status: AC
  Administered 2021-12-31: 1 g via INTRAVENOUS
  Filled 2021-12-31: qty 100

## 2021-12-31 MED ORDER — POTASSIUM CHLORIDE CRYS ER 20 MEQ PO TBCR
20.0000 meq | EXTENDED_RELEASE_TABLET | Freq: Once | ORAL | Status: AC
Start: 2021-12-31 — End: 2021-12-31
  Administered 2021-12-31: 20 meq via ORAL
  Filled 2021-12-31: qty 1

## 2021-12-31 NOTE — TOC Initial Note (Signed)
Transition of Care [facility]) - Initial/Assessment Note    Patient Details  Name: Reginald Clarke MRN: [id number] Date of Birth: February 09, 1991  Transition of Care [facility] CM/SW Contact:    Harriet Masson, RN Phone Number: 12/31/2021, 11:28 AM  Clinical Narrative:                 He was discharged on 5/10 after one dose of IV oritavancin.   Was getting IV Abx outpatient through [facility] IV dalbavancin 5/22, next dose due on 5/30 and 6/6   Readmitted 5/24 for +blood cultures.  Patient would like TOC to make him a PCP apt in [place] once closer to discharge date.  Grandmother or Girlfriend can take patient to apts and transport home once ready for discharge.    TOC will continue to follow       Patient Goals and CMS Choice        Expected Discharge Plan and Services                                                Prior Living Arrangements/Services                       Activities of Daily Living Home Assistive Devices/Equipment: None ADL Screening (condition at time of admission) Patient's cognitive ability adequate to safely complete daily activities?: Yes Is the patient deaf or have difficulty hearing?: No Does the patient have difficulty seeing, even when wearing glasses/contacts?: No Does the patient have difficulty concentrating, remembering, or making decisions?: No Patient able to express need for assistance with ADLs?: No Does the patient have difficulty dressing or bathing?: No Independently performs ADLs?: Yes (appropriate for developmental age) Does the patient have difficulty walking or climbing stairs?: No Weakness of Legs: None Weakness of Arms/Hands: None  Permission Sought/Granted                  Emotional Assessment              Admission diagnosis:  Bacteremia [R78.81] Nausea [R11.0] Patient Active Problem List   Diagnosis Date Noted   Bacteremia with gram positive cocci  12/26/2021   Normocytic anemia 12/26/2021    Prolonged QT interval 12/26/2021   Fever 12/25/2021   Smoking 12/25/2021   Anxiety 12/05/2021   Septic pulmonary embolism ([name]) 11/25/2021   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   Hepatitis C, chronic ([name]) 11/24/2021   Polysubstance abuse ([name]) 11/17/2021   Tobacco abuse 11/15/2021   Endocarditis of tricuspid valve 11/15/2021   MSSA bacteremia 11/14/2021   Chronic pain syndrome/methadone abuse  11/14/2021   GERD (gastroesophageal reflux disease) 11/14/2021   PCP:  [facility]:   [facility] [address], [place] - [address] [place] [address] Phone: [phone] Fax: [phone]     Social Determinants of Health (SDOH) Interventions    Readmission Risk Interventions     View : No data to display.

## 2021-12-31 NOTE — Progress Notes (Addendum)
PROGRESS NOTE        PATIENT DETAILS Name: Reginald Clarke Age: 31 y.o. Sex: male Date of Birth: 20-Apr-1991 Admit Date: 12/26/2021 Admitting Physician Orma Flaming, MD PCP:Pcp, No  Brief Summary: Patient is a 31 y.o.  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  Underwent angio vac procedure on 4/26.Completed cefazolin on 5/10-planned weekly dalbavancin at the ID clinic.   5/22>> outpatient blood culture positive for MSSA 5/24>> admit to [facility] for persistent bacteremia-worsening back pain.  Significant studies: 4/13>> MRI thoracic/MRI lumbar spine: No obvious discitis/osteomyelitis. 4/21>> TEE: EF 60-65%, tricuspid valve vegetation. 5/24>> CTA chest: Cavitary nodule RLL-additional scattered nodules.  No PE. 5/25>> orthopantogram: No periapical abscesses. 5/25>> MRI T-spine: Edema T10-11 endplates-early spinal infection is possible. 5/25>> MRI L-spine: Negative for infection. 5/25>>TTE: EF 60-65%, 1.4 x 1.8 cm mobile mass on the septal tricuspid leaflet.  Significant microbiology data: 4/09>> blood culture: MSSA 4/12>> blood culture: No growth 5/22>> blood culture: MSSA 5/24>> blood culture: No growth  Procedures: 4/26>> debridement of right atrial mass/tricuspid valve vegetation by cardiothoracic surgery.  Consults: ID, IR, cardiothoracic surgery  Subjective: Lying comfortably in bed-back pain is stable-anxiety controlled-no other issues.  Objective: Vitals: Blood pressure 113/72, pulse 90, temperature 97.7 F (36.5 C), temperature source Oral, resp. rate 20, height 5\' 10"  (1.778 m), weight  76.2 kg, SpO2 95 %.   Exam: Gen Exam:Alert awake-not in any distress HEENT:atraumatic, normocephalic Chest: B/L clear to auscultation anteriorly CVS:S1S2 regular Abdomen:soft non tender, non distended Extremities:no edema Neurology: Non focal Skin: no rash   Pertinent Labs/Radiology:    Latest Ref Rng & Units 12/31/2021   12:53 AM 12/29/2021    4:42 AM 12/27/2021    3:32 AM  CBC  WBC 4.0 - 10.5 K/uL 5.9   4.5   6.5    Hemoglobin 13.0 - 17.0 g/dL 9.7   10.1   9.5    Hematocrit 39.0 - 52.0 % 29.7   31.6   29.9    Platelets 150 - 400 K/uL 245   196   152      Lab Results  Component Value Date   NA 139 12/31/2021   K 3.9 12/31/2021   CL 106 12/31/2021   CO2 28 12/31/2021     Assessment/Plan: Persistent MSSA bacteremia with native tricuspid valve endocarditis-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  No concerning findings on physical exam.    Chronic pain syndrome: Continue methadone-on Percocet/Toradol for breakthrough pain.    Prolonged QTc: QTc normalized as of EKG on 5/27.  Watch closely-as patient on high-dose methadone.  Attempt to keep K > 4, Mg > 2.    Anxiety disorder: As needed benzodiazepines-stable this morning.  Chronic hepatitis C: Treatment deferred to the outpatient setting.  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  Tobacco use: Counseled  BMI: Estimated  body mass index is 24.11 kg/m as calculated from the following:   Height as of this encounter: 5\' 10"  (1.778 m).   Weight as of this encounter: 76.2 kg.   Code status:   Code Status: Full Code   DVT Prophylaxis: enoxaparin (LOVENOX)  injection 40 mg Start: 12/28/21 1100 SCDs Start: 12/26/21 1724   Family Communication: None at bedside-he will update family members himself-he does not want me calling any particular family member.   Disposition Plan: Status is: Inpatient Remains inpatient appropriate because: Persistent MSSA bacteremia-not yet stable for discharge.   Planned Discharge Destination:Home when completes course of IV antibiotics.  Not a candidate for outpatient IV antibiotics.   Diet: Diet Order             Diet regular Room service appropriate? Yes; Fluid consistency: Thin  Diet effective now                     Antimicrobial agents: Anti-infectives (From admission, onward)    Start     Dose/Rate Route Frequency Ordered Stop   12/28/21 2200  ceFAZolin (ANCEF) IVPB 2g/100 mL premix        2 g 200 mL/hr over 30 Minutes Intravenous Every 8 hours 12/28/21 1352     12/27/21 0200  vancomycin (VANCOREADY) IVPB 1750 mg/350 mL  Status:  Discontinued        1,750 mg 175 mL/hr over 120 Minutes Intravenous Every 12 hours 12/26/21 1413 12/28/21 1352   12/26/21 1400  vancomycin (VANCOREADY) IVPB 1750 mg/350 mL        1,750 mg 175 mL/hr over 120 Minutes Intravenous  Once 12/26/21 1354 12/26/21 1645        MEDICATIONS: Scheduled Meds:  enoxaparin (LOVENOX) injection  40 mg Subcutaneous Daily   methadone  135 mg Oral Daily   pantoprazole  40 mg Oral Daily   polyethylene glycol  17 g Oral Daily   sodium chloride flush  3 mL Intravenous Q12H   Continuous Infusions:  sodium chloride      ceFAZolin (ANCEF) IV 2 g (12/31/21 0533)   PRN Meds:.sodium chloride, acetaminophen **OR** acetaminophen, ALPRAZolam, HYDROcodone-acetaminophen, ketorolac, methocarbamol, sodium chloride flush   I have personally reviewed following labs and imaging studies  LABORATORY DATA: CBC: Recent Labs  Lab 12/24/21 1427 12/26/21 1216 12/27/21 0332 12/29/21 0442 12/31/21 0053  WBC 7.2 7.5 6.5 4.5 5.9  NEUTROABS   --  5.0  --   --   --   HGB 10.5* 11.1* 9.5* 10.1* 9.7*  HCT 32.4* 34.5* 29.9* 31.6* 29.7*  MCV 89.8 94.3 92.9 92.7 91.4  PLT 112* 144* 152 196 245     Basic Metabolic Panel: Recent Labs  Lab 12/26/21 1216 12/26/21 1809 12/27/21 0332 12/28/21 0237 12/29/21 0442 12/31/21 0053  NA 138  --  135 140 139 139  K 3.9  --  3.6 3.7 3.9 3.9  CL 103  --  102 105 106 106  CO2 25  --  26 27 26 28   GLUCOSE 88  --  90 108* 107* 91  BUN <5*  --  5* <5* 6 5*  CREATININE 0.63  --  0.69 0.59* 0.69 0.85  CALCIUM 8.8*  --  8.2* 8.5* 8.8* 8.7*  MG  --  2.0  --  2.0 2.0 1.8     GFR: Estimated Creatinine Clearance: 131.2 mL/min (by C-G formula based on SCr of 0.85 mg/dL).  Liver Function Tests: Recent Labs  Lab 12/24/21 1427 12/26/21  1216  AST 22 28  ALT 24 29  ALKPHOS  --  64  BILITOT 0.5 0.5  PROT 6.9 7.3  ALBUMIN  --  3.1*    No results for input(s): LIPASE, AMYLASE in the last 168 hours. No results for input(s): AMMONIA in the last 168 hours.  Coagulation Profile: Recent Labs  Lab 12/24/21 1427  INR 1.2*     Cardiac Enzymes: No results for input(s): CKTOTAL, CKMB, CKMBINDEX, TROPONINI in the last 168 hours.  BNP (last 3 results) No results for input(s): PROBNP in the last 8760 hours.  Lipid Profile: No results for input(s): CHOL, HDL, LDLCALC, TRIG, CHOLHDL, LDLDIRECT in the last 72 hours.  Thyroid Function Tests: No results for input(s): TSH, T4TOTAL, FREET4, T3FREE, THYROIDAB in the last 72 hours.   Anemia Panel: No results for input(s): VITAMINB12, FOLATE, FERRITIN, TIBC, IRON, RETICCTPCT in the last 72 hours.   Urine analysis:    Component Value Date/Time   COLORURINE YELLOW 12/26/2021 1106   APPEARANCEUR CLEAR 12/26/2021 1106   LABSPEC 1.015 12/26/2021 1106   PHURINE 6.0 12/26/2021 1106   GLUCOSEU NEGATIVE 12/26/2021 1106   HGBUR NEGATIVE 12/26/2021 1106   BILIRUBINUR NEGATIVE 12/26/2021 1106   KETONESUR NEGATIVE 12/26/2021 1106   PROTEINUR  NEGATIVE 12/26/2021 1106   NITRITE NEGATIVE 12/26/2021 1106   LEUKOCYTESUR NEGATIVE 12/26/2021 1106    Sepsis Labs: Lactic Acid, Venous    Component Value Date/Time   LATICACIDVEN 1.4 12/26/2021 1412    MICROBIOLOGY: Recent Results (from the past 240 hour(s))  Blood culture (routine single)     Status: Abnormal   Collection Time: 12/24/21  2:27 AM   Specimen: Blood  Result Value Ref Range Status   MICRO NUMBER: [id number]  Final   SPECIMEN QUALITY: Adequate  Final   Source BLOOD 2  Final   STATUS: FINAL  Final   ISOLATE 1: Staphylococcus aureus (AA)  Final    Comment: Staphylococcus aureus Negative for inducible clindamycin resistance. , from aerobic and anaerobic bottles   COMMENT: Aerobic and anaerobic bottle received.  Final      Susceptibility   Staphylococcus aureus - BLOOD CULTURE, POSITIVE 1    VANCOMYCIN 1 Sensitive     CLINDAMYCIN <=0.25 Sensitive     ERYTHROMYCIN >=8 Resistant     GENTAMICIN <=0.5 Sensitive     OXACILLIN* 1 Sensitive      * Oxacillin susceptible staphylococci are susceptible to other penicillinase-stable penicillins (e.g., methicillin, nafcillin), beta- lactam/beta-lactamase inhibitor combinations, and cephems with staphylococcal indications, including cefazolin.     TETRACYCLINE <=1 Sensitive     TRIMETH/SULFA* <=10 Sensitive      * Oxacillin susceptible staphylococci are susceptible to other penicillinase-stable penicillins (e.g., methicillin, nafcillin), beta- lactam/beta-lactamase inhibitor combinations, and cephems with staphylococcal indications, including cefazolin. Legend: S = Susceptible  I = Intermediate R = Resistant  NS = Not susceptible * = Not tested  NR = Not reported **NN = See antimicrobic comments   Blood culture (routine single)     Status: Abnormal   Collection Time: 12/24/21  2:27 PM   Specimen: Blood  Result Value Ref Range Status   MICRO NUMBER: [id number]  Final   SPECIMEN QUALITY: Adequate  Final   Source BLOOD  1  Final   STATUS: FINAL  Final   ISOLATE 1: Staphylococcus aureus (AA)  Final    Comment: Staphylococcus aureus Negative for inducible clindamycin resistance. , from aerobic and anaerobic bottles   COMMENT: Aerobic and anaerobic bottle received.  Final  Susceptibility   Staphylococcus aureus - BLOOD CULTURE, POSITIVE 1    VANCOMYCIN 1 Sensitive     CLINDAMYCIN <=0.25 Sensitive     ERYTHROMYCIN >=8 Resistant     GENTAMICIN <=0.5 Sensitive     OXACILLIN* 0.5 Sensitive      * Oxacillin susceptible staphylococci are susceptible to other penicillinase-stable penicillins (e.g., methicillin, nafcillin), beta- lactam/beta-lactamase inhibitor combinations, and cephems with staphylococcal indications, including cefazolin.     TETRACYCLINE <=1 Sensitive     TRIMETH/SULFA* <=10 Sensitive      * Oxacillin susceptible staphylococci are susceptible to other penicillinase-stable penicillins (e.g., methicillin, nafcillin), beta- lactam/beta-lactamase inhibitor combinations, and cephems with staphylococcal indications, including cefazolin. Legend: S = Susceptible  I = Intermediate R = Resistant  NS = Not susceptible * = Not tested  NR = Not reported **NN = See antimicrobic comments   Blood culture (routine x 2)     Status: None   Collection Time: 12/26/21 12:17 PM   Specimen: BLOOD RIGHT FOREARM  Result Value Ref Range Status   Specimen Description BLOOD RIGHT FOREARM  Final   Special Requests   Final    AEROBIC BOTTLE ONLY Blood Culture results may not be optimal due to an inadequate volume of blood received in culture bottles   Culture   Final    NO GROWTH 5 DAYS Performed at [facility], [place] [address] [place], [place] [address]    Report Status 12/31/2021 FINAL  Final    RADIOLOGY STUDIES/RESULTS: No results found.   LOS: 5 days   Oren Binet, MD  [facility]    To contact the attending provider between 7A-7P or the covering provider during after  hours 7P-7A, please log into the web site [email or web address] and access using universal [place] password for that web site. If you do not have the password, please call the hospital operator.  12/31/2021, 9:30 AM

## 2022-01-01 ENCOUNTER — Ambulatory Visit: Payer: BLUE CROSS/BLUE SHIELD

## 2022-01-01 DIAGNOSIS — R7881 Bacteremia: Secondary | ICD-10-CM | POA: Diagnosis not present

## 2022-01-01 LAB — CBC
HCT: 32.4 % — ABNORMAL LOW (ref 38.5–50.0)
Hemoglobin: 10.5 g/dL — ABNORMAL LOW (ref 13.2–17.1)
MCH: 29.1 pg (ref 27.0–33.0)
MCHC: 32.4 g/dL (ref 32.0–36.0)
MCV: 89.8 fL (ref 80.0–100.0)
MPV: 10.7 fL (ref 7.5–12.5)
Platelets: 112 10*3/uL — ABNORMAL LOW (ref 140–400)
RBC: 3.61 10*6/uL — ABNORMAL LOW (ref 4.20–5.80)
RDW: 13.7 % (ref 11.0–15.0)
WBC: 7.2 10*3/uL (ref 3.8–10.8)

## 2022-01-01 LAB — LIVER FIBROSIS, FIBROTEST-ACTITEST
ALT: 21 U/L (ref 9–46)
Alpha-2-Macroglobulin: 151 mg/dL (ref 106–279)
Apolipoprotein A1: 65 mg/dL — ABNORMAL LOW (ref 94–176)
Bilirubin: 0.4 mg/dL (ref 0.2–1.2)
Fibrosis Score: 0.12
GGT: 27 U/L (ref 3–90)
Haptoglobin: 251 mg/dL — ABNORMAL HIGH (ref 43–212)
Necroinflammat ACT Score: 0.07
Reference ID: 4386437

## 2022-01-01 LAB — HEPATIC FUNCTION PANEL
AG Ratio: 1 (calc) (ref 1.0–2.5)
ALT: 24 U/L (ref 9–46)
AST: 22 U/L (ref 10–40)
Albumin: 3.4 g/dL — ABNORMAL LOW (ref 3.6–5.1)
Alkaline phosphatase (APISO): 59 U/L (ref 36–130)
Bilirubin, Direct: 0.1 mg/dL (ref 0.0–0.2)
Globulin: 3.5 g/dL (calc) (ref 1.9–3.7)
Indirect Bilirubin: 0.4 mg/dL (calc) (ref 0.2–1.2)
Total Bilirubin: 0.5 mg/dL (ref 0.2–1.2)
Total Protein: 6.9 g/dL (ref 6.1–8.1)

## 2022-01-01 LAB — PROTIME-INR
INR: 1.2 — ABNORMAL HIGH
Prothrombin Time: 12.2 s — ABNORMAL HIGH (ref 9.0–11.5)

## 2022-01-01 LAB — CULTURE, BLOOD (SINGLE)
MICRO NUMBER:: 13428335
SPECIMEN QUALITY:: ADEQUATE

## 2022-01-01 LAB — HIV ANTIBODY (ROUTINE TESTING W REFLEX): HIV 1&2 Ab, 4th Generation: NONREACTIVE

## 2022-01-01 LAB — CULTURE, BLOOD (ROUTINE X 2): Culture: NO GROWTH

## 2022-01-01 LAB — HEPATITIS B CORE ANTIBODY, TOTAL: Hep B Core Total Ab: NONREACTIVE

## 2022-01-01 NOTE — Progress Notes (Signed)
     [address]       [name] [address]             [phone]        Images reviewed. Tricuspid valve endocarditis.  S/p angiovac debridement.  Original case was challenging due to location and chronicity of the vegetation.  Likely would not benefit from repeat angiovac.  Pt also is being treated for spinal osteomyelitis.  This will need to be fully treated prior to planning for valve replacement.   Continue abx therapy for now.  Will revisit treatment for tricuspid valve as outpatient  Arthur Aydelotte O Eddie Koc

## 2022-01-01 NOTE — Progress Notes (Signed)
PROGRESS NOTE        PATIENT DETAILS Name: Reginald Clarke Age: 31 y.o. Sex: male Date of Birth: 01-18-1991 Admit Date: 12/26/2021 Admitting Physician Orland Mustard, MD PCP:Pcp, No  Brief Summary: Patient is a 31 y.o.  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  Underwent angio vac procedure on 4/26.Completed cefazolin on 5/10-planned weekly dalbavancin at the ID clinic.   5/22>> outpatient blood culture positive for MSSA 5/24>> admit to [facility] for persistent bacteremia-worsening back pain.  Significant studies: 4/13>> MRI thoracic/MRI lumbar spine: No obvious discitis/osteomyelitis. 4/21>> TEE: EF 60-65%, tricuspid valve vegetation. 5/24>> CTA chest: Cavitary nodule RLL-additional scattered nodules.  No PE. 5/25>> orthopantogram: No periapical abscesses. 5/25>> MRI T-spine: Edema T10-11 endplates-early spinal infection is possible. 5/25>> MRI L-spine: Negative for infection. 5/25>>TTE: EF 60-65%, 1.4 x 1.8 cm mobile mass on the septal tricuspid leaflet.  Significant microbiology data: 4/09>> blood culture: MSSA 4/12>> blood culture: No growth 5/22>> blood culture: MSSA 5/24>> blood culture: No growth  Procedures: 4/26>> debridement of right atrial mass/tricuspid valve vegetation by cardiothoracic surgery.  Consults: ID, IR, cardiothoracic surgery  Subjective: Some anxiety issues but otherwise stable.  Objective: Vitals: Blood pressure 93/64, pulse 85, temperature 98 F (36.7 C), temperature source Oral, resp. rate 15, height 5\' 10"  (1.778 m), weight 76.2 kg, SpO2 99 %.   Exam: Gen Exam:Alert  awake-not in any distress HEENT:atraumatic, normocephalic Chest: B/L clear to auscultation anteriorly CVS:S1S2 regular Abdomen:soft non tender, non distended Extremities:no edema Neurology: Non focal Skin: no rash   Pertinent Labs/Radiology:    Latest Ref Rng & Units 12/31/2021   12:53 AM 12/29/2021    4:42 AM 12/27/2021    3:32 AM  CBC  WBC 4.0 - 10.5 K/uL 5.9   4.5   6.5    Hemoglobin 13.0 - 17.0 g/dL 9.7   12/29/2021   9.5    Hematocrit 39.0 - 52.0 % 29.7   31.6   29.9    Platelets 150 - 400 K/uL 245   196   152      Lab Results  Component Value Date   NA 139 12/31/2021   K 3.9 12/31/2021   CL 106 12/31/2021   CO2 28 12/31/2021     Assessment/Plan: Persistent MSSA bacteremia with native tricuspid valve endocarditis-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  Per CT surgery-not a candidate for repeat angio vac-reassess for valve replacement in the outpatient setting.  Awaiting further recommendations from infectious disease.    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  No concerning findings on physical exam.    Chronic pain syndrome: Continue methadone-on Percocet/Toradol for breakthrough pain.    Prolonged QTc: QTc normalized as of EKG on 5/27.  Watch closely-as patient on high-dose methadone.  Attempt to keep K > 4, Mg > 2.    Anxiety disorder: As needed benzodiazepines-stable this morning.  Chronic hepatitis C: Treatment deferred to the outpatient setting.  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  Tobacco use: Counseled  BMI: Estimated body mass index is 24.11 kg/m as calculated from the following:   Height as of this encounter: 5\' 10"  (1.778 m).   Weight as of this encounter: 76.2 kg.   Code status:   Code Status: Full Code   DVT Prophylaxis: enoxaparin (LOVENOX)  injection 40 mg Start: 12/28/21 1100 SCDs Start: 12/26/21 1724   Family Communication: None at bedside-he will update family members himself-he does not want me calling any particular family member.   Disposition Plan: Status is: Inpatient Remains inpatient appropriate because: Persistent MSSA bacteremia-not yet stable for discharge.   Planned Discharge Destination:Home when completes course of IV antibiotics.  Not a candidate for outpatient IV antibiotics.   Diet: Diet Order             Diet regular Room service appropriate? Yes; Fluid consistency: Thin  Diet effective now                     Antimicrobial agents: Anti-infectives (From admission, onward)    Start     Dose/Rate Route Frequency Ordered Stop   12/28/21 2200  ceFAZolin (ANCEF) IVPB 2g/100 mL premix        2 g 200 mL/hr over 30 Minutes Intravenous Every 8 hours 12/28/21 1352     12/27/21 0200  vancomycin (VANCOREADY) IVPB 1750 mg/350 mL  Status:  Discontinued        1,750 mg 175 mL/hr over 120 Minutes Intravenous Every 12 hours 12/26/21 1413 12/28/21 1352   12/26/21 1400  vancomycin (VANCOREADY) IVPB 1750 mg/350 mL        1,750 mg 175 mL/hr over 120 Minutes Intravenous  Once 12/26/21 1354 12/26/21 1645        MEDICATIONS: Scheduled Meds:  enoxaparin (LOVENOX) injection  40 mg Subcutaneous Daily   methadone  135 mg Oral Daily   pantoprazole  40 mg Oral Daily   polyethylene glycol  17 g Oral Daily   sodium chloride flush  3 mL Intravenous Q12H   Continuous Infusions:  sodium chloride      ceFAZolin (ANCEF) IV 2 g (01/01/22 0619)   PRN Meds:.sodium chloride, acetaminophen **OR** acetaminophen, ALPRAZolam, HYDROcodone-acetaminophen, ketorolac, methocarbamol, sodium chloride flush   I have personally reviewed following labs and imaging studies  LABORATORY DATA: CBC: Recent Labs  Lab 12/26/21 1216 12/27/21 0332 12/29/21 0442 12/31/21 0053  WBC 7.5 6.5 4.5 5.9  NEUTROABS 5.0  --   --   --    HGB 11.1* 9.5* 10.1* 9.7*  HCT 34.5* 29.9* 31.6* 29.7*  MCV 94.3 92.9 92.7 91.4  PLT 144* 152 196 245     Basic Metabolic Panel: Recent Labs  Lab 12/26/21 1216 12/26/21 1809 12/27/21 0332 12/28/21 0237 12/29/21 0442 12/31/21 0053  NA 138  --  135 140 139 139  K 3.9  --  3.6 3.7 3.9 3.9  CL 103  --  102 105 106 106  CO2 25  --  26 27 26 28   GLUCOSE 88  --  90 108* 107* 91  BUN <5*  --  5* <5* 6 5*  CREATININE 0.63  --  0.69 0.59* 0.69 0.85  CALCIUM 8.8*  --  8.2* 8.5* 8.8* 8.7*  MG  --  2.0  --  2.0 2.0 1.8     GFR: Estimated Creatinine Clearance: 131.2 mL/min (by C-G formula based on SCr of 0.85 mg/dL).  Liver Function Tests: Recent Labs  Lab 12/26/21 1216  AST 28  ALT  29  ALKPHOS 64  BILITOT 0.5  PROT 7.3  ALBUMIN 3.1*    No results for input(s): LIPASE, AMYLASE in the last 168 hours. No results for input(s): AMMONIA in the last 168 hours.  Coagulation Profile: No results for input(s): INR, PROTIME in the last 168 hours.   Cardiac Enzymes: No results for input(s): CKTOTAL, CKMB, CKMBINDEX, TROPONINI in the last 168 hours.  BNP (last 3 results) No results for input(s): PROBNP in the last 8760 hours.  Lipid Profile: No results for input(s): CHOL, HDL, LDLCALC, TRIG, CHOLHDL, LDLDIRECT in the last 72 hours.  Thyroid Function Tests: No results for input(s): TSH, T4TOTAL, FREET4, T3FREE, THYROIDAB in the last 72 hours.   Anemia Panel: No results for input(s): VITAMINB12, FOLATE, FERRITIN, TIBC, IRON, RETICCTPCT in the last 72 hours.   Urine analysis:    Component Value Date/Time   COLORURINE YELLOW 12/26/2021 1106   APPEARANCEUR CLEAR 12/26/2021 1106   LABSPEC 1.015 12/26/2021 1106   PHURINE 6.0 12/26/2021 1106   GLUCOSEU NEGATIVE 12/26/2021 1106   HGBUR NEGATIVE 12/26/2021 1106   BILIRUBINUR NEGATIVE 12/26/2021 1106   KETONESUR NEGATIVE 12/26/2021 1106   PROTEINUR NEGATIVE 12/26/2021 1106   NITRITE NEGATIVE 12/26/2021 1106   LEUKOCYTESUR  NEGATIVE 12/26/2021 1106    Sepsis Labs: Lactic Acid, Venous    Component Value Date/Time   LATICACIDVEN 1.4 12/26/2021 1412    MICROBIOLOGY: Recent Results (from the past 240 hour(s))  Blood culture (routine single)     Status: Abnormal   Collection Time: 12/24/21  2:27 AM   Specimen: Blood  Result Value Ref Range Status   MICRO NUMBER: [id number]  Final   SPECIMEN QUALITY: Adequate  Final   Source BLOOD 2  Final   STATUS: FINAL  Final   ISOLATE 1: Staphylococcus aureus (AA)  Final    Comment: Staphylococcus aureus Negative for inducible clindamycin resistance. , from aerobic and anaerobic bottles   COMMENT: Aerobic and anaerobic bottle received.  Final      Susceptibility   Staphylococcus aureus - BLOOD CULTURE, POSITIVE 1    VANCOMYCIN 1 Sensitive     CLINDAMYCIN <=0.25 Sensitive     ERYTHROMYCIN >=8 Resistant     GENTAMICIN <=0.5 Sensitive     OXACILLIN* 1 Sensitive      * Oxacillin susceptible staphylococci are susceptible to other penicillinase-stable penicillins (e.g., methicillin, nafcillin), beta- lactam/beta-lactamase inhibitor combinations, and cephems with staphylococcal indications, including cefazolin.     TETRACYCLINE <=1 Sensitive     TRIMETH/SULFA* <=10 Sensitive      * Oxacillin susceptible staphylococci are susceptible to other penicillinase-stable penicillins (e.g., methicillin, nafcillin), beta- lactam/beta-lactamase inhibitor combinations, and cephems with staphylococcal indications, including cefazolin. Legend: S = Susceptible  I = Intermediate R = Resistant  NS = Not susceptible * = Not tested  NR = Not reported **NN = See antimicrobic comments   Blood culture (routine single)     Status: Abnormal   Collection Time: 12/24/21  2:27 PM   Specimen: Blood  Result Value Ref Range Status   MICRO NUMBER: [id number]  Final   SPECIMEN QUALITY: Adequate  Final   Source BLOOD 1  Final   STATUS: FINAL  Final   ISOLATE 1: Staphylococcus aureus (AA)   Final    Comment: Staphylococcus aureus Negative for inducible clindamycin resistance. , from aerobic and anaerobic bottles   COMMENT: Aerobic and anaerobic bottle received.  Final      Susceptibility   Staphylococcus aureus - BLOOD CULTURE, POSITIVE 1  VANCOMYCIN 1 Sensitive     CLINDAMYCIN <=0.25 Sensitive     ERYTHROMYCIN >=8 Resistant     GENTAMICIN <=0.5 Sensitive     OXACILLIN* 0.5 Sensitive      * Oxacillin susceptible staphylococci are susceptible to other penicillinase-stable penicillins (e.g., methicillin, nafcillin), beta- lactam/beta-lactamase inhibitor combinations, and cephems with staphylococcal indications, including cefazolin.     TETRACYCLINE <=1 Sensitive     TRIMETH/SULFA* <=10 Sensitive      * Oxacillin susceptible staphylococci are susceptible to other penicillinase-stable penicillins (e.g., methicillin, nafcillin), beta- lactam/beta-lactamase inhibitor combinations, and cephems with staphylococcal indications, including cefazolin. Legend: S = Susceptible  I = Intermediate R = Resistant  NS = Not susceptible * = Not tested  NR = Not reported **NN = See antimicrobic comments   Blood culture (routine x 2)     Status: None   Collection Time: 12/26/21 12:17 PM   Specimen: BLOOD RIGHT FOREARM  Result Value Ref Range Status   Specimen Description BLOOD RIGHT FOREARM  Final   Special Requests   Final    AEROBIC BOTTLE ONLY Blood Culture results may not be optimal due to an inadequate volume of blood received in culture bottles   Culture   Final    NO GROWTH 5 DAYS Performed at [facility], [address], [place], [place] [address]    Report Status 12/31/2021 FINAL  Final    RADIOLOGY STUDIES/RESULTS: No results found.   LOS: 6 days   Jeoffrey Massed, MD  [facility]    To contact the attending provider between 7A-7P or the covering provider during after hours 7P-7A, please log into the web site [email or web address] and access using  universal [place] password for that web site. If you do not have the password, please call the hospital operator.  01/01/2022, 11:47 AM

## 2022-01-02 DIAGNOSIS — R7881 Bacteremia: Secondary | ICD-10-CM | POA: Diagnosis not present

## 2022-01-02 MED ORDER — KETOROLAC TROMETHAMINE 30 MG/ML IJ SOLN
30.0000 mg | Freq: Three times a day (TID) | INTRAMUSCULAR | Status: AC | PRN
  Administered 2022-01-02 – 2022-01-04 (×6): 30 mg via INTRAVENOUS
  Filled 2022-01-02 (×11): qty 1

## 2022-01-02 NOTE — Progress Notes (Signed)
[facility]  Date of Admission:  12/26/2021     Total days of antibiotics 10         ASSESSMENT:  Reginald Clarke is not a candidate for repeat angiovac secondary to chronicity and location of the vegetation. Discussed plan of care to include 6 weeks of IV therapy and then transition to at least 2 weeks of oral therapy with Cefazolin for his disseminated MSSA infection. CT surgery will re-evaluate for valve replacement once infection is treated. Amenable to staying inpatient for 6 week course of IV antibiotics. Currently on Day 7 placing IV end date of July 5th. Informed Hepatitis C can be treated as outpatient. ID will sign off for now and please call back prior to end date of July 5th or sooner if needed. Remaining medical and supportive care per primary team.   PLAN:  Continue Cefazolin through 02/06/22. Please re-consult prior to July 5th. Remaining medical and supportive care per primary team.   Principal Problem:   Bacteremia with gram positive cocci  Active Problems:   Chronic pain syndrome/methadone abuse    GERD (gastroesophageal reflux disease)   Tobacco abuse   Endocarditis of tricuspid valve   Polysubstance abuse ([name])   Hepatitis C, chronic ([name])   Septic pulmonary embolism ([name])   Anxiety   Normocytic anemia   Prolonged QT interval    enoxaparin (LOVENOX) injection  40 mg Subcutaneous Daily   methadone  135 mg Oral Daily   pantoprazole  40 mg Oral Daily   polyethylene glycol  17 g Oral Daily   sodium chloride flush  3 mL Intravenous Q12H    SUBJECTIVE:  Afebrile overnight with no acute events. Has questions about Hepatitis C treatment and plan of care.    Allergies  Allergen Reactions   Penicillins Hives   Amoxil [Amoxicillin Trihydrate]      Review of Systems: Review of Systems  Constitutional:  Negative for chills, fever and weight loss.  Respiratory:  Negative for cough, shortness of breath and wheezing.   Cardiovascular:   Negative for chest pain and leg swelling.  Gastrointestinal:  Negative for abdominal pain, constipation, diarrhea, nausea and vomiting.  Skin:  Negative for rash.     OBJECTIVE: Vitals:   01/01/22 2304 01/02/22 0300 01/02/22 0754 01/02/22 1203  BP: (!) 88/58 (!) 99/59 100/68 97/77  Pulse: (!) 110 90 96 94  Resp: 16 19 15 19   Temp: 98.7 F (37.1 C) 98.2 F (36.8 C) 98 F (36.7 C) 97.6 F (36.4 C)  TempSrc: Oral Oral Oral Oral  SpO2:   94% 94%  Weight:      Height:       Body mass index is 24.11 kg/m.  Physical Exam Constitutional:      General: Reginald Clarke is not in acute distress.    Appearance: Reginald Clarke is well-developed.     Comments: Lying in bed; pleasant  Eyes:     Conjunctiva/sclera: Conjunctivae normal.  Cardiovascular:     Rate and Rhythm: Normal rate and regular rhythm.     Heart sounds: Normal heart sounds. No murmur heard.   No friction rub. No gallop.  Pulmonary:     Effort: Pulmonary effort is normal. No respiratory distress.     Breath sounds: Normal breath sounds. No wheezing or rales.  Chest:     Chest wall: No tenderness.  Abdominal:     General: Bowel sounds are normal.     Palpations: Abdomen is soft.  Tenderness: There is no abdominal tenderness.  Musculoskeletal:     Cervical back: Neck supple.  Lymphadenopathy:     Cervical: No cervical adenopathy.  Skin:    General: Skin is warm and dry.     Findings: No rash.  Neurological:     Mental Status: Reginald Clarke is alert and oriented to person, place, and time.  Psychiatric:        Behavior: Behavior normal.        Thought Content: Thought content normal.        Judgment: Judgment normal.    Lab Results Lab Results  Component Value Date   WBC 5.9 12/31/2021   HGB 9.7 (L) 12/31/2021   HCT 29.7 (L) 12/31/2021   MCV 91.4 12/31/2021   PLT 245 12/31/2021    Lab Results  Component Value Date   CREATININE 0.85 12/31/2021   BUN 5 (L) 12/31/2021   NA 139 12/31/2021   K 3.9 12/31/2021   CL 106 12/31/2021    CO2 28 12/31/2021    Lab Results  Component Value Date   ALT 29 12/26/2021   AST 28 12/26/2021   GGT 27 12/24/2021   ALKPHOS 64 12/26/2021   BILITOT 0.5 12/26/2021     Microbiology: Recent Results (from the past 240 hour(s))  Blood culture (routine single)     Status: Abnormal   Collection Time: 12/24/21  2:27 AM   Specimen: Blood  Result Value Ref Range Status   MICRO NUMBER: [id number]  Final   SPECIMEN QUALITY: Adequate  Final   Source BLOOD 2  Final   STATUS: FINAL  Final   ISOLATE 1: Staphylococcus aureus (AA)  Final    Comment: Staphylococcus aureus Negative for inducible clindamycin resistance. , from aerobic and anaerobic bottles   COMMENT: Aerobic and anaerobic bottle received.  Final      Susceptibility   Staphylococcus aureus - BLOOD CULTURE, POSITIVE 1    VANCOMYCIN 1 Sensitive     CLINDAMYCIN <=0.25 Sensitive     ERYTHROMYCIN >=8 Resistant     GENTAMICIN <=0.5 Sensitive     OXACILLIN* 1 Sensitive      * Oxacillin susceptible staphylococci are susceptible to other penicillinase-stable penicillins (e.g., methicillin, nafcillin), beta- lactam/beta-lactamase inhibitor combinations, and cephems with staphylococcal indications, including cefazolin.     TETRACYCLINE <=1 Sensitive     TRIMETH/SULFA* <=10 Sensitive      * Oxacillin susceptible staphylococci are susceptible to other penicillinase-stable penicillins (e.g., methicillin, nafcillin), beta- lactam/beta-lactamase inhibitor combinations, and cephems with staphylococcal indications, including cefazolin. Legend: S = Susceptible  I = Intermediate R = Resistant  NS = Not susceptible * = Not tested  NR = Not reported **NN = See antimicrobic comments   Blood culture (routine single)     Status: Abnormal   Collection Time: 12/24/21  2:27 PM   Specimen: Blood  Result Value Ref Range Status   MICRO NUMBER: [id number]  Final   SPECIMEN QUALITY: Adequate  Final   Source BLOOD 1  Final   STATUS: FINAL  Final    ISOLATE 1: Staphylococcus aureus (AA)  Final    Comment: Staphylococcus aureus Negative for inducible clindamycin resistance. , from aerobic and anaerobic bottles   COMMENT: Aerobic and anaerobic bottle received.  Final      Susceptibility   Staphylococcus aureus - BLOOD CULTURE, POSITIVE 1    VANCOMYCIN 1 Sensitive     CLINDAMYCIN <=0.25 Sensitive     ERYTHROMYCIN >=8 Resistant     GENTAMICIN <=0.5 Sensitive  OXACILLIN* 0.5 Sensitive      * Oxacillin susceptible staphylococci are susceptible to other penicillinase-stable penicillins (e.g., methicillin, nafcillin), beta- lactam/beta-lactamase inhibitor combinations, and cephems with staphylococcal indications, including cefazolin.     TETRACYCLINE <=1 Sensitive     TRIMETH/SULFA* <=10 Sensitive      * Oxacillin susceptible staphylococci are susceptible to other penicillinase-stable penicillins (e.g., methicillin, nafcillin), beta- lactam/beta-lactamase inhibitor combinations, and cephems with staphylococcal indications, including cefazolin. Legend: S = Susceptible  I = Intermediate R = Resistant  NS = Not susceptible * = Not tested  NR = Not reported **NN = See antimicrobic comments   Blood culture (routine x 2)     Status: None   Collection Time: 12/26/21 12:17 PM   Specimen: BLOOD  Result Value Ref Range Status   Specimen Description BLOOD LEFT ANTECUBITAL  Final   Special Requests   Final    BOTTLES DRAWN AEROBIC AND ANAEROBIC Blood Culture results may not be optimal due to an inadequate volume of blood received in culture bottles   Culture   Final    NO GROWTH 5 DAYS Performed at [facility], [address], [place], [place] [address]    Report Status 01/01/2022 FINAL  Final  Blood culture (routine x 2)     Status: None   Collection Time: 12/26/21 12:17 PM   Specimen: BLOOD RIGHT FOREARM  Result Value Ref Range Status   Specimen Description BLOOD RIGHT FOREARM  Final   Special Requests   Final    AEROBIC  BOTTLE ONLY Blood Culture results may not be optimal due to an inadequate volume of blood received in culture bottles   Culture   Final    NO GROWTH 5 DAYS Performed at [facility], [address], [place], [place] [address]    Report Status 12/31/2021 FINAL  Final     Marcos Eke, NP [facility]  01/02/2022  1:35 PM

## 2022-01-02 NOTE — Progress Notes (Signed)
PROGRESS NOTE        PATIENT DETAILS Name: Reginald Clarke Age: 31 y.o. Sex: male Date of Birth: June 23, 1991 Admit Date: 12/26/2021 Admitting Physician Orma Flaming, MD PCP:Pcp, No  Brief Summary: Patient is a 31 y.o.  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  Underwent angio vac procedure on 4/26.Completed cefazolin on 5/10-planned weekly dalbavancin at the ID clinic.   5/22>> outpatient blood culture positive for MSSA 5/24>> admit to [facility] for persistent bacteremia-worsening back pain.  Significant studies: 4/13>> MRI thoracic/MRI lumbar spine: No obvious discitis/osteomyelitis. 4/21>> TEE: EF 60-65%, tricuspid valve vegetation. 5/24>> CTA chest: Cavitary nodule RLL-additional scattered nodules.  No PE. 5/25>> orthopantogram: No periapical abscesses. 5/25>> MRI T-spine: Edema T10-11 endplates-early spinal infection is possible. 5/25>> MRI L-spine: Negative for infection. 5/25>>TTE: EF 60-65%, 1.4 x 1.8 cm mobile mass on the septal tricuspid leaflet.  Significant microbiology data: 4/09>> blood culture: MSSA 4/12>> blood culture: No growth 5/22>> blood culture: MSSA 5/24>> blood culture: No growth  Procedures: 4/26>> debridement of right atrial mass/tricuspid valve vegetation by cardiothoracic surgery.  Consults: ID, IR, cardiothoracic surgery  Subjective: Lying comfortably in bed-no major issues overnight.  Objective: Vitals: Blood pressure 100/68, pulse 96, temperature 98 F (36.7 C), temperature source Oral, resp. rate 15, height 5\' 10"  (1.778 m), weight 76.2 kg, SpO2 94 %.   Exam: Gen  Exam:Alert awake-not in any distress HEENT:atraumatic, normocephalic Chest: B/L clear to auscultation anteriorly CVS:S1S2 regular Abdomen:soft non tender, non distended Extremities:no edema Neurology: Non focal Skin: no rash   Pertinent Labs/Radiology:    Latest Ref Rng & Units 12/31/2021   12:53 AM 12/29/2021    4:42 AM 12/27/2021    3:32 AM  CBC  WBC 4.0 - 10.5 K/uL 5.9   4.5   6.5    Hemoglobin 13.0 - 17.0 g/dL 9.7   10.1   9.5    Hematocrit 39.0 - 52.0 % 29.7   31.6   29.9    Platelets 150 - 400 K/uL 245   196   152      Lab Results  Component Value Date   NA 139 12/31/2021   K 3.9 12/31/2021   CL 106 12/31/2021   CO2 28 12/31/2021     Assessment/Plan: Persistent MSSA bacteremia with native tricuspid valve endocarditis-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  Per CT surgery-not a candidate for repeat angio vac-reassess for valve replacement in the outpatient setting.  Awaiting further recommendations from infectious disease regarding duration of IV antimicrobial therapy while inpatient.    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  No concerning findings on physical exam.    Chronic pain syndrome: Continue methadone-on Percocet/Toradol for breakthrough pain.    Prolonged QTc: QTc normalized as of EKG on 5/27.  Watch closely-as patient on high-dose methadone.  Attempt to keep K > 4, Mg > 2.    Anxiety disorder: As needed benzodiazepines-stable this morning.  Chronic hepatitis C: Treatment deferred to the outpatient setting.  GERD: Continue PPI  History of  IV methamphetamine use: Claims abstinence since last hospitalization.  Tobacco use: Counseled  BMI: Estimated body mass index is 24.11 kg/m as calculated from the following:   Height as of this encounter: 5\' 10"  (1.778 m).   Weight as of this encounter: 76.2 kg.   Code  status:   Code Status: Full Code   DVT Prophylaxis: enoxaparin (LOVENOX) injection 40 mg Start: 12/28/21 1100 SCDs Start: 12/26/21 1724   Family Communication: None at bedside-he will update family members himself-he does not want me calling any particular family member.   Disposition Plan: Status is: Inpatient Remains inpatient appropriate because: Persistent MSSA bacteremia-not yet stable for discharge.   Planned Discharge Destination:Home when completes course of IV antibiotics.  Not a candidate for outpatient IV antibiotics.   Diet: Diet Order             Diet regular Room service appropriate? Yes; Fluid consistency: Thin  Diet effective now                     Antimicrobial agents: Anti-infectives (From admission, onward)    Start     Dose/Rate Route Frequency Ordered Stop   12/28/21 2200  ceFAZolin (ANCEF) IVPB 2g/100 mL premix        2 g 200 mL/hr over 30 Minutes Intravenous Every 8 hours 12/28/21 1352     12/27/21 0200  vancomycin (VANCOREADY) IVPB 1750 mg/350 mL  Status:  Discontinued        1,750 mg 175 mL/hr over 120 Minutes Intravenous Every 12 hours 12/26/21 1413 12/28/21 1352   12/26/21 1400  vancomycin (VANCOREADY) IVPB 1750 mg/350 mL        1,750 mg 175 mL/hr over 120 Minutes Intravenous  Once 12/26/21 1354 12/26/21 1645        MEDICATIONS: Scheduled Meds:  enoxaparin (LOVENOX) injection  40 mg Subcutaneous Daily   methadone  135 mg Oral Daily   pantoprazole  40 mg Oral Daily   polyethylene glycol  17 g Oral Daily   sodium chloride flush  3 mL Intravenous Q12H   Continuous Infusions:  sodium chloride      ceFAZolin (ANCEF) IV 2 g (01/02/22 0536)   PRN Meds:.sodium chloride, acetaminophen **OR** acetaminophen, ALPRAZolam, HYDROcodone-acetaminophen, ketorolac, methocarbamol, sodium chloride flush   I have personally reviewed following labs and imaging studies  LABORATORY DATA: CBC: Recent Labs  Lab 12/26/21 1216 12/27/21 0332  12/29/21 0442 12/31/21 0053  WBC 7.5 6.5 4.5 5.9  NEUTROABS 5.0  --   --   --   HGB 11.1* 9.5* 10.1* 9.7*  HCT 34.5* 29.9* 31.6* 29.7*  MCV 94.3 92.9 92.7 91.4  PLT 144* 152 196 245     Basic Metabolic Panel: Recent Labs  Lab 12/26/21 1216 12/26/21 1809 12/27/21 0332 12/28/21 0237 12/29/21 0442 12/31/21 0053  NA 138  --  135 140 139 139  K 3.9  --  3.6 3.7 3.9 3.9  CL 103  --  102 105 106 106  CO2 25  --  26 27 26 28   GLUCOSE 88  --  90 108* 107* 91  BUN <5*  --  5* <5* 6 5*  CREATININE 0.63  --  0.69 0.59* 0.69 0.85  CALCIUM 8.8*  --  8.2* 8.5* 8.8* 8.7*  MG  --  2.0  --  2.0 2.0 1.8     GFR: Estimated Creatinine Clearance: 131.2 mL/min (by C-G formula based on SCr of 0.85 mg/dL).  Liver Function Tests: Recent Labs  Lab 12/26/21 1216  AST 28  ALT 29  ALKPHOS 64  BILITOT 0.5  PROT 7.3  ALBUMIN 3.1*    No results for input(s): LIPASE, AMYLASE in the last 168 hours. No results for input(s): AMMONIA in the last 168 hours.  Coagulation Profile: No results for input(s): INR, PROTIME in the last 168 hours.   Cardiac Enzymes: No results for input(s): CKTOTAL, CKMB, CKMBINDEX, TROPONINI in the last 168 hours.  BNP (last 3 results) No results for input(s): PROBNP in the last 8760 hours.  Lipid Profile: No results for input(s): CHOL, HDL, LDLCALC, TRIG, CHOLHDL, LDLDIRECT in the last 72 hours.  Thyroid Function Tests: No results for input(s): TSH, T4TOTAL, FREET4, T3FREE, THYROIDAB in the last 72 hours.   Anemia Panel: No results for input(s): VITAMINB12, FOLATE, FERRITIN, TIBC, IRON, RETICCTPCT in the last 72 hours.   Urine analysis:    Component Value Date/Time   COLORURINE YELLOW 12/26/2021 1106   APPEARANCEUR CLEAR 12/26/2021 1106   LABSPEC 1.015 12/26/2021 1106   PHURINE 6.0 12/26/2021 1106   GLUCOSEU NEGATIVE 12/26/2021 1106   HGBUR NEGATIVE 12/26/2021 1106   [place] 12/26/2021 1106   [place] 12/26/2021 1106    PROTEINUR NEGATIVE 12/26/2021 1106   NITRITE NEGATIVE 12/26/2021 1106   [place] 12/26/2021 1106    Sepsis Labs: Lactic Acid, Venous    Component Value Date/Time   LATICACIDVEN 1.4 12/26/2021 1412    MICROBIOLOGY: Recent Results (from the past 240 hour(s))  Blood culture (routine single)     Status: Abnormal   Collection Time: 12/24/21  2:27 AM   Specimen: Blood  Result Value Ref Range Status   MICRO NUMBER: [id number]  Final   SPECIMEN QUALITY: Adequate  Final   Source BLOOD 2  Final   STATUS: FINAL  Final   ISOLATE 1: Staphylococcus aureus (AA)  Final    Comment: Staphylococcus aureus Negative for inducible clindamycin resistance. , from aerobic and anaerobic bottles   COMMENT: Aerobic and anaerobic bottle received.  Final      Susceptibility   Staphylococcus aureus - BLOOD CULTURE, POSITIVE 1    VANCOMYCIN 1 Sensitive     CLINDAMYCIN <=0.25 Sensitive     ERYTHROMYCIN >=8 Resistant     GENTAMICIN <=0.5 Sensitive     OXACILLIN* 1 Sensitive      * Oxacillin susceptible staphylococci are susceptible to other penicillinase-stable penicillins (e.g., methicillin, nafcillin), beta- lactam/beta-lactamase inhibitor combinations, and cephems with staphylococcal indications, including cefazolin.     TETRACYCLINE <=1 Sensitive     TRIMETH/SULFA* <=10 Sensitive      * Oxacillin susceptible staphylococci are susceptible to other penicillinase-stable penicillins (e.g., methicillin, nafcillin), beta- lactam/beta-lactamase inhibitor combinations, and cephems with staphylococcal indications, including cefazolin. Legend: S = Susceptible  I = Intermediate R = Resistant  NS = Not susceptible * = Not tested  NR = Not reported **NN = See antimicrobic comments   Blood culture (routine single)     Status: Abnormal   Collection Time: 12/24/21  2:27 PM   Specimen: Blood  Result Value Ref Range Status   MICRO NUMBER: [id number]  Final   SPECIMEN QUALITY: Adequate  Final    Source BLOOD 1  Final   STATUS: FINAL  Final   ISOLATE 1: Staphylococcus aureus (AA)  Final    Comment: Staphylococcus aureus Negative for inducible clindamycin resistance. , from aerobic and anaerobic bottles   COMMENT: Aerobic and anaerobic bottle received.  Final      Susceptibility   Staphylococcus  aureus - BLOOD CULTURE, POSITIVE 1    VANCOMYCIN 1 Sensitive     CLINDAMYCIN <=0.25 Sensitive     ERYTHROMYCIN >=8 Resistant     GENTAMICIN <=0.5 Sensitive     OXACILLIN* 0.5 Sensitive      * Oxacillin susceptible staphylococci are susceptible to other penicillinase-stable penicillins (e.g., methicillin, nafcillin), beta- lactam/beta-lactamase inhibitor combinations, and cephems with staphylococcal indications, including cefazolin.     TETRACYCLINE <=1 Sensitive     TRIMETH/SULFA* <=10 Sensitive      * Oxacillin susceptible staphylococci are susceptible to other penicillinase-stable penicillins (e.g., methicillin, nafcillin), beta- lactam/beta-lactamase inhibitor combinations, and cephems with staphylococcal indications, including cefazolin. Legend: S = Susceptible  I = Intermediate R = Resistant  NS = Not susceptible * = Not tested  NR = Not reported **NN = See antimicrobic comments   Blood culture (routine x 2)     Status: None   Collection Time: 12/26/21 12:17 PM   Specimen: BLOOD  Result Value Ref Range Status   Specimen Description BLOOD LEFT ANTECUBITAL  Final   Special Requests   Final    BOTTLES DRAWN AEROBIC AND ANAEROBIC Blood Culture results may not be optimal due to an inadequate volume of blood received in culture bottles   Culture   Final    NO GROWTH 5 DAYS Performed at [facility], [place] [address] [place], [place] [address]    Report Status 01/01/2022 FINAL  Final  Blood culture (routine x 2)     Status: None   Collection Time: 12/26/21 12:17 PM   Specimen: BLOOD RIGHT FOREARM  Result Value Ref Range Status   Specimen Description BLOOD RIGHT  FOREARM  Final   Special Requests   Final    AEROBIC BOTTLE ONLY Blood Culture results may not be optimal due to an inadequate volume of blood received in culture bottles   Culture   Final    NO GROWTH 5 DAYS Performed at [facility], [place] [address] [place], [place] [address]    Report Status 12/31/2021 FINAL  Final    RADIOLOGY STUDIES/RESULTS: No results found.   LOS: 7 days   Oren Binet, MD  [facility]    To contact the attending provider between 7A-7P or the covering provider during after hours 7P-7A, please log into the web site [email or web address] and access using universal [place] password for that web site. If you do not have the password, please call the hospital operator.  01/02/2022, 9:49 AM

## 2022-01-03 DIAGNOSIS — K219 Gastro-esophageal reflux disease without esophagitis: Secondary | ICD-10-CM | POA: Diagnosis not present

## 2022-01-03 DIAGNOSIS — G894 Chronic pain syndrome: Secondary | ICD-10-CM | POA: Diagnosis not present

## 2022-01-03 DIAGNOSIS — F419 Anxiety disorder, unspecified: Secondary | ICD-10-CM | POA: Diagnosis not present

## 2022-01-03 DIAGNOSIS — R7881 Bacteremia: Secondary | ICD-10-CM | POA: Diagnosis not present

## 2022-01-03 LAB — BASIC METABOLIC PANEL
Anion gap: 6 (ref 5–15)
BUN: 8 mg/dL (ref 6–20)
CO2: 30 mmol/L (ref 22–32)
Calcium: 8.9 mg/dL (ref 8.9–10.3)
Chloride: 104 mmol/L (ref 98–111)
Creatinine, Ser: 0.69 mg/dL (ref 0.61–1.24)
GFR, Estimated: >60 mL/min (ref >60)
Glucose, Bld: 82 mg/dL (ref 70–99)
Potassium: 4.5 mmol/L (ref 3.5–5.1)
Sodium: 140 mmol/L (ref 135–145)

## 2022-01-03 LAB — CBC
HCT: 29.3 % — ABNORMAL LOW (ref 39.0–52.0)
Hemoglobin: 9.4 g/dL — ABNORMAL LOW (ref 13.0–17.0)
MCH: 30 pg (ref 26.0–34.0)
MCHC: 32.1 g/dL (ref 30.0–36.0)
MCV: 93.6 fL (ref 80.0–100.0)
Platelets: 245 10*3/uL (ref 150–400)
RBC: 3.13 MIL/uL — ABNORMAL LOW (ref 4.22–5.81)
RDW: 14.8 % (ref 11.5–15.5)
WBC: 6.9 10*3/uL (ref 4.0–10.5)
nRBC: 0 % (ref 0.0–0.2)

## 2022-01-03 LAB — MAGNESIUM: Magnesium: 1.8 mg/dL (ref 1.7–2.4)

## 2022-01-03 MED ORDER — SIMETHICONE 80 MG PO CHEW
80.0000 mg | CHEWABLE_TABLET | Freq: Four times a day (QID) | ORAL | Status: DC | PRN
  Administered 2022-01-03: 80 mg via ORAL
  Filled 2022-01-03: qty 1

## 2022-01-03 MED ORDER — SODIUM CHLORIDE 0.9 % IV BOLUS
500.0000 mL | Freq: Once | INTRAVENOUS | Status: AC
  Administered 2022-01-03: 500 mL via INTRAVENOUS

## 2022-01-03 MED ORDER — MAGNESIUM SULFATE 2 GM/50ML IV SOLN
2.0000 g | Freq: Once | INTRAVENOUS | Status: AC
  Administered 2022-01-03: 2 g via INTRAVENOUS
  Filled 2022-01-03: qty 50

## 2022-01-03 NOTE — Progress Notes (Signed)
PROGRESS NOTE        PATIENT DETAILS Name: Reginald Clarke Age: 31 y.o. Sex: male Date of Birth: [birth year] Admit Date: 12/26/2021 Admitting Physician Orland MustardAllison Wolfe, MD PCP:Pcp, No  Brief Summary: Patient is a 31 y.o.  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  Underwent angio vac procedure on 4/26.Completed cefazolin on 5/10-planned weekly dalbavancin at the ID clinic.   5/22>> outpatient blood culture positive for MSSA 5/24>> admit to [facility] for persistent bacteremia-worsening back pain.  Significant studies: 4/13>> MRI thoracic/MRI lumbar spine: No obvious discitis/osteomyelitis. 4/21>> TEE: EF 60-65%, tricuspid valve vegetation. 5/24>> CTA chest: Cavitary nodule RLL-additional scattered nodules.  No PE. 5/25>> orthopantogram: No periapical abscesses. 5/25>> MRI T-spine: Edema T10-11 endplates-early spinal infection is possible. 5/25>> MRI L-spine: Negative for infection. 5/25>>TTE: EF 60-65%, 1.4 x 1.8 cm mobile mass on the septal tricuspid leaflet.  Significant microbiology data: 4/09>> blood culture: MSSA 4/12>> blood culture: No growth 5/22>> blood culture: MSSA 5/24>> blood culture: No growth  Procedures: 4/26>> debridement of right atrial mass/tricuspid valve vegetation by cardiothoracic surgery.  Consults: ID, IR, cardiothoracic surgery  Subjective: No chest pain or shortness of breath.  Lying comfortably in bed.  Objective: Vitals: Blood pressure 94/70, pulse 83, temperature 98.2 F (36.8 C), temperature source Oral, resp. rate 19, height 5\' 10"  (1.778 m), weight 76.2 kg, SpO2 96 %.    Exam: Gen Exam:Alert awake-not in any distress HEENT:atraumatic, normocephalic Chest: B/L clear to auscultation anteriorly CVS:S1S2 regular Abdomen:soft non tender, non distended Extremities:no edema Neurology: Non focal Skin: no rash   Pertinent Labs/Radiology:    Latest Ref Rng & Units 01/03/2022    5:30 AM 12/31/2021   12:53 AM 12/29/2021    4:42 AM  CBC  WBC 4.0 - 10.5 K/uL 6.9   5.9   4.5    Hemoglobin 13.0 - 17.0 g/dL 9.4   9.7   16.110.1    Hematocrit 39.0 - 52.0 % 29.3   29.7   31.6    Platelets 150 - 400 K/uL 245   245   196      Lab Results  Component Value Date   NA 140 01/03/2022   K 4.5 01/03/2022   CL 104 01/03/2022   CO2 30 01/03/2022     Assessment/Plan: Persistent MSSA bacteremia with native tricuspid valve endocarditis-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  Per CT surgery-not a candidate for repeat angio vac-reassess for valve replacement in the outpatient setting.  Per ID-continue IV Ancef while inpatient-end date 02/06/2022-followed by 2 weeks of cefdinir-end date 7/19.  Unfortunately-patient is not a candidate for outpatient IV antibiotic therapy-and will remain inpatient until he completes IV Ancef.  Per ID note-please engage ID services prior to discharge on 7/5.  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  No concerning findings on physical exam.    Chronic pain syndrome: Continue methadone-on Percocet/Toradol for breakthrough pain.    Prolonged QTc: QTc normalized as of EKG on 5/27.  Watch closely-as patient on high-dose methadone.  Attempt  to keep K > 4, Mg > 2.    Anxiety disorder: As needed benzodiazepines-stable this morning.  Chronic hepatitis C: Treatment deferred to the outpatient setting.  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  Tobacco use:  Counseled  BMI: Estimated body mass index is 24.11 kg/m as calculated from the following:   Height as of this encounter: 5\' 10"  (1.778 m).   Weight as of this encounter: 76.2 kg.   Code status:   Code Status: Full Code   DVT Prophylaxis: enoxaparin (LOVENOX) injection 40 mg Start: 12/28/21 1100 SCDs Start: 12/26/21 1724   Family Communication: None at bedside-he will update family members himself-he does not want me calling any particular family member.   Disposition Plan: Status is: Inpatient Remains inpatient appropriate because: Persistent MSSA bacteremia-not a candidate for outpatient IV antibiotics-we will remain inpatient until 7/5    Planned Discharge Destination:Home when completes course of IV antibiotics.  Not a candidate for outpatient IV antibiotics.   Diet: Diet Order             Diet regular Room service appropriate? Yes; Fluid consistency: Thin  Diet effective now                     Antimicrobial agents: Anti-infectives (From admission, onward)    Start     Dose/Rate Route Frequency Ordered Stop   12/28/21 2200  ceFAZolin (ANCEF) IVPB 2g/100 mL premix        2 g 200 mL/hr over 30 Minutes Intravenous Every 8 hours 12/28/21 1352 02/06/22 2359   12/27/21 0200  vancomycin (VANCOREADY) IVPB 1750 mg/350 mL  Status:  Discontinued        1,750 mg 175 mL/hr over 120 Minutes Intravenous Every 12 hours 12/26/21 1413 12/28/21 1352   12/26/21 1400  vancomycin (VANCOREADY) IVPB 1750 mg/350 mL        1,750 mg 175 mL/hr over 120 Minutes Intravenous  Once 12/26/21 1354 12/26/21 1645        MEDICATIONS: Scheduled Meds:  enoxaparin (LOVENOX) injection  40 mg Subcutaneous Daily   methadone  135 mg Oral Daily   pantoprazole  40 mg Oral Daily   polyethylene glycol  17 g Oral Daily   sodium chloride flush  3 mL Intravenous Q12H   Continuous Infusions:  sodium chloride      ceFAZolin (ANCEF) IV 2 g (01/03/22 0527)   PRN Meds:.sodium chloride, acetaminophen  **OR** acetaminophen, ALPRAZolam, HYDROcodone-acetaminophen, ketorolac, methocarbamol, sodium chloride flush   I have personally reviewed following labs and imaging studies  LABORATORY DATA: CBC: Recent Labs  Lab 12/29/21 0442 12/31/21 0053 01/03/22 0530  WBC 4.5 5.9 6.9  HGB 10.1* 9.7* 9.4*  HCT 31.6* 29.7* 29.3*  MCV 92.7 91.4 93.6  PLT 196 245 245     Basic Metabolic Panel: Recent Labs  Lab 12/28/21 0237 12/29/21 0442 12/31/21 0053 01/03/22 0530  NA 140 139 139 140  K 3.7 3.9 3.9 4.5  CL 105 106 106 104  CO2 27 26 28 30   GLUCOSE 108* 107* 91 82  BUN <5* 6 5* 8  CREATININE 0.59* 0.69 0.85 0.69  CALCIUM 8.5* 8.8* 8.7* 8.9  MG 2.0 2.0 1.8 1.8     GFR: Estimated Creatinine Clearance: 139.4 mL/min (by C-G formula based on SCr of 0.69 mg/dL).  Liver Function Tests: No results for input(s): AST, ALT, ALKPHOS, BILITOT, PROT, ALBUMIN in the last 168 hours.  No results for input(s):  LIPASE, AMYLASE in the last 168 hours. No results for input(s): AMMONIA in the last 168 hours.  Coagulation Profile: No results for input(s): INR, PROTIME in the last 168 hours.   Cardiac Enzymes: No results for input(s): CKTOTAL, CKMB, CKMBINDEX, TROPONINI in the last 168 hours.  BNP (last 3 results) No results for input(s): PROBNP in the last 8760 hours.  Lipid Profile: No results for input(s): CHOL, HDL, LDLCALC, TRIG, CHOLHDL, LDLDIRECT in the last 72 hours.  Thyroid Function Tests: No results for input(s): TSH, T4TOTAL, FREET4, T3FREE, THYROIDAB in the last 72 hours.   Anemia Panel: No results for input(s): VITAMINB12, FOLATE, FERRITIN, TIBC, IRON, RETICCTPCT in the last 72 hours.   Urine analysis:    Component Value Date/Time   COLORURINE YELLOW 12/26/2021 1106   APPEARANCEUR CLEAR 12/26/2021 1106   LABSPEC 1.015 12/26/2021 1106   PHURINE 6.0 12/26/2021 1106   GLUCOSEU NEGATIVE 12/26/2021 1106   HGBUR NEGATIVE 12/26/2021 1106   BILIRUBINUR NEGATIVE 12/26/2021  1106   KETONESUR NEGATIVE 12/26/2021 1106   PROTEINUR NEGATIVE 12/26/2021 1106   NITRITE NEGATIVE 12/26/2021 1106   LEUKOCYTESUR NEGATIVE 12/26/2021 1106    Sepsis Labs: Lactic Acid, Venous    Component Value Date/Time   LATICACIDVEN 1.4 12/26/2021 1412    MICROBIOLOGY: Recent Results (from the past 240 hour(s))  Blood culture (routine single)     Status: Abnormal   Collection Time: 12/24/21  2:27 PM   Specimen: Blood  Result Value Ref Range Status   MICRO NUMBER: [id number]  Final   SPECIMEN QUALITY: Adequate  Final   Source BLOOD 1  Final   STATUS: FINAL  Final   ISOLATE 1: Staphylococcus aureus (AA)  Final    Comment: Staphylococcus aureus Negative for inducible clindamycin resistance. , from aerobic and anaerobic bottles   COMMENT: Aerobic and anaerobic bottle received.  Final      Susceptibility   Staphylococcus aureus - BLOOD CULTURE, POSITIVE 1    VANCOMYCIN 1 Sensitive     CLINDAMYCIN <=0.25 Sensitive     ERYTHROMYCIN >=8 Resistant     GENTAMICIN <=0.5 Sensitive     OXACILLIN* 0.5 Sensitive      * Oxacillin susceptible staphylococci are susceptible to other penicillinase-stable penicillins (e.g., methicillin, nafcillin), beta- lactam/beta-lactamase inhibitor combinations, and cephems with staphylococcal indications, including cefazolin.     TETRACYCLINE <=1 Sensitive     TRIMETH/SULFA* <=10 Sensitive      * Oxacillin susceptible staphylococci are susceptible to other penicillinase-stable penicillins (e.g., methicillin, nafcillin), beta- lactam/beta-lactamase inhibitor combinations, and cephems with staphylococcal indications, including cefazolin. Legend: S = Susceptible  I = Intermediate R = Resistant  NS = Not susceptible * = Not tested  NR = Not reported **NN = See antimicrobic comments   Blood culture (routine x 2)     Status: None   Collection Time: 12/26/21 12:17 PM   Specimen: BLOOD  Result Value Ref Range Status   Specimen Description BLOOD LEFT  ANTECUBITAL  Final   Special Requests   Final    BOTTLES DRAWN AEROBIC AND ANAEROBIC Blood Culture results may not be optimal due to an inadequate volume of blood received in culture bottles   Culture   Final    NO GROWTH 5 DAYS Performed at [facility], [address], [place], [place] [address]    Report Status 01/01/2022 FINAL  Final  Blood culture (routine x 2)     Status: None   Collection Time: 12/26/21 12:17 PM   Specimen: BLOOD  RIGHT FOREARM  Result Value Ref Range Status   Specimen Description BLOOD RIGHT FOREARM  Final   Special Requests   Final    AEROBIC BOTTLE ONLY Blood Culture results may not be optimal due to an inadequate volume of blood received in culture bottles   Culture   Final    NO GROWTH 5 DAYS Performed at [facility], [address], [place], [place] [address]    Report Status 12/31/2021 FINAL  Final    RADIOLOGY STUDIES/RESULTS: No results found.   LOS: 8 days   Jeoffrey Massed, MD  [facility]    To contact the attending provider between 7A-7P or the covering provider during after hours 7P-7A, please log into the web site [email or web address] and access using universal [place] password for that web site. If you do not have the password, please call the hospital operator.  01/03/2022, 11:34 AM

## 2022-01-03 NOTE — TOC Progression Note (Signed)
Transition of Care [facility]) - Progression Note    Patient Details  Name: [place] Losada MRN: [id number] Date of Birth: 1991/01/13  [facility] CM/SW Contact  Harriet Masson, RN Phone Number: 01/03/2022, 2:50 PM  Clinical Narrative:     Patient will need iv antibiotics until 02/06/22. Not a candidate for outpatient iv therapy.    Expected Discharge Plan and Services                                                 Social Determinants of Health (SDOH) Interventions    Readmission Risk Interventions     View : No data to display.

## 2022-01-03 NOTE — Progress Notes (Signed)
Patient's BP decreased. Notified MD, new order received.

## 2022-01-03 NOTE — Progress Notes (Signed)
Patient refused enoxaparin shot.Patient was educated about the medication.Informed to MD.

## 2022-01-04 DIAGNOSIS — R7881 Bacteremia: Secondary | ICD-10-CM | POA: Diagnosis not present

## 2022-01-04 MED ORDER — IBUPROFEN 600 MG PO TABS
600.0000 mg | ORAL_TABLET | Freq: Four times a day (QID) | ORAL | Status: DC | PRN
  Administered 2022-01-09: 600 mg via ORAL
  Filled 2022-01-04 (×2): qty 1

## 2022-01-04 MED ORDER — SODIUM CHLORIDE 0.9 % IV BOLUS
500.0000 mL | Freq: Once | INTRAVENOUS | Status: AC
  Administered 2022-01-04: 500 mL via INTRAVENOUS

## 2022-01-04 NOTE — Progress Notes (Signed)
PROGRESS NOTE        PATIENT DETAILS Name: Reginald Clarke Age: 31 y.o. Sex: male Date of Birth: [birth year] Admit Date: 12/26/2021 Admitting Physician Orland MustardAllison Wolfe, MD PCP:Pcp, No  Brief Summary: Patient is a 31 y.o.  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  Underwent angio vac procedure on 4/26.Completed cefazolin on 5/10-planned weekly dalbavancin at the ID clinic.   5/22>> outpatient blood culture positive for MSSA 5/24>> [facility]-worsening back pain.  Significant studies: 4/13>> MRI thoracic/MRI lumbar spine: No obvious discitis/osteomyelitis. 4/21>> TEE: EF 60-65%, tricuspid valve vegetation. 5/24>> CTA chest: Cavitary nodule RLL-additional scattered nodules.  No PE. 5/25>> orthopantogram: No periapical abscesses. 5/25>> MRI T-spine: Edema T10-11 endplates-early spinal infection is possible. 5/25>> MRI L-spine: Negative for infection. 5/25>>TTE: EF 60-65%, 1.4 x 1.8 cm mobile mass on the septal tricuspid leaflet.  Significant microbiology data: 4/09>> blood culture: MSSA 4/12>> blood culture: No growth 5/22>> blood culture: MSSA 5/24>> blood culture: No growth  Procedures: 4/26>> debridement of right atrial mass/tricuspid valve vegetation by cardiothoracic surgery.  Consults: ID, IR, cardiothoracic surgery  Subjective: Sitting up in bed and eating breakfast this morning.  Appears comfortable.  Objective: Vitals: Blood pressure 107/66, pulse 86, temperature 97.6 F (36.4 C), temperature source Oral, resp. rate 16, height 5\' 10"  (1.778 m), weight 76.2 kg,  SpO2 98 %.   Exam: Gen Exam:Alert awake-not in any distress HEENT:atraumatic, normocephalic Chest: B/L clear to auscultation anteriorly CVS:S1S2 regular Abdomen:soft non tender, non distended Extremities:no edema Neurology: Non focal Skin: no rash   Pertinent Labs/Radiology:    Latest Ref Rng & Units 01/03/2022    5:30 AM 12/31/2021   12:53 AM 12/29/2021    4:42 AM  CBC  WBC 4.0 - 10.5 K/uL 6.9   5.9   4.5    Hemoglobin 13.0 - 17.0 g/dL 9.4   9.7   52.810.1    Hematocrit 39.0 - 52.0 % 29.3   29.7   31.6    Platelets 150 - 400 K/uL 245   245   196      Lab Results  Component Value Date   NA 140 01/03/2022   K 4.5 01/03/2022   CL 104 01/03/2022   CO2 30 01/03/2022     Assessment/Plan: Persistent MSSA bacteremia with native tricuspid valve endocarditis-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  Per CT surgery-not a candidate for repeat angio vac-reassess for valve replacement in the outpatient setting.  Patient is not a candidate for outpatient IV antibiotics-he is willing to remain inpatient until he completes his IV antibiotic course.   Per ID-continue IV Ancef while inpatient-end date 02/06/2022-followed by 2 weeks of cefdinir-end date 7/19.  Per ID note-please engage ID services prior to discharge on 7/5.  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    Prolonged QTc: QTc normalized as of EKG on 5/27.  Watch closely-as patient on high-dose methadone.  Attempt to keep K > 4,  Mg > 2.    Anxiety disorder: As needed benzodiazepines-stable this morning.  Chronic hepatitis C: Treatment deferred to the outpatient setting.  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  Tobacco use: Counseled  BMI: Estimated body  mass index is 24.11 kg/m as calculated from the following:   Height as of this encounter: 5\' 10"  (1.778 m).   Weight as of this encounter: 76.2 kg.   Code status:   Code Status: Full Code   DVT Prophylaxis: enoxaparin (LOVENOX) injection 40 mg Start: 12/28/21 1100 SCDs Start: 12/26/21 1724   Family Communication: None at bedside-he will update family members himself-he does not want me calling any particular family member.   Disposition Plan: Status is: Inpatient Remains inpatient appropriate because: Persistent MSSA bacteremia-not a candidate for outpatient IV antibiotics-we will remain inpatient until 7/5    Planned Discharge Destination:Home when completes course of IV antibiotics.  Not a candidate for outpatient IV antibiotics.   Diet: Diet Order             Diet regular Room service appropriate? Yes; Fluid consistency: Thin  Diet effective now                     Antimicrobial agents: Anti-infectives (From admission, onward)    Start     Dose/Rate Route Frequency Ordered Stop   12/28/21 2200  ceFAZolin (ANCEF) IVPB 2g/100 mL premix        2 g 200 mL/hr over 30 Minutes Intravenous Every 8 hours 12/28/21 1352 02/06/22 2359   12/27/21 0200  vancomycin (VANCOREADY) IVPB 1750 mg/350 mL  Status:  Discontinued        1,750 mg 175 mL/hr over 120 Minutes Intravenous Every 12 hours 12/26/21 1413 12/28/21 1352   12/26/21 1400  vancomycin (VANCOREADY) IVPB 1750 mg/350 mL        1,750 mg 175 mL/hr over 120 Minutes Intravenous  Once 12/26/21 1354 12/26/21 1645        MEDICATIONS: Scheduled Meds:  enoxaparin (LOVENOX) injection  40 mg Subcutaneous Daily   methadone  135 mg Oral Daily   pantoprazole  40 mg Oral Daily   polyethylene glycol  17 g Oral Daily   sodium chloride flush  3 mL Intravenous Q12H   Continuous Infusions:  sodium chloride      ceFAZolin (ANCEF) IV 2 g (01/04/22 1307)   PRN Meds:.sodium chloride, acetaminophen **OR** acetaminophen, ALPRAZolam,  HYDROcodone-acetaminophen, methocarbamol, simethicone, sodium chloride flush   I have personally reviewed following labs and imaging studies  LABORATORY DATA: CBC: Recent Labs  Lab 12/29/21 0442 12/31/21 0053 01/03/22 0530  WBC 4.5 5.9 6.9  HGB 10.1* 9.7* 9.4*  HCT 31.6* 29.7* 29.3*  MCV 92.7 91.4 93.6  PLT 196 245 245     Basic Metabolic Panel: Recent Labs  Lab 12/29/21 0442 12/31/21 0053 01/03/22 0530  NA 139 139 140  K 3.9 3.9 4.5  CL 106 106 104  CO2 26 28 30   GLUCOSE 107* 91 82  BUN 6 5* 8  CREATININE 0.69 0.85 0.69  CALCIUM 8.8* 8.7* 8.9  MG 2.0 1.8 1.8     GFR: Estimated Creatinine Clearance: 139.4 mL/min (by C-G formula based on SCr of 0.69 mg/dL).  Liver Function Tests: No results for input(s): AST, ALT, ALKPHOS, BILITOT, PROT, ALBUMIN in the last 168 hours.  No results for input(s): LIPASE, AMYLASE in the last 168 hours. No results for input(s): AMMONIA in the last 168  hours.  Coagulation Profile: No results for input(s): INR, PROTIME in the last 168 hours.   Cardiac Enzymes: No results for input(s): CKTOTAL, CKMB, CKMBINDEX, TROPONINI in the last 168 hours.  BNP (last 3 results) No results for input(s): PROBNP in the last 8760 hours.  Lipid Profile: No results for input(s): CHOL, HDL, LDLCALC, TRIG, CHOLHDL, LDLDIRECT in the last 72 hours.  Thyroid Function Tests: No results for input(s): TSH, T4TOTAL, FREET4, T3FREE, THYROIDAB in the last 72 hours.   Anemia Panel: No results for input(s): VITAMINB12, FOLATE, FERRITIN, TIBC, IRON, RETICCTPCT in the last 72 hours.   Urine analysis:    Component Value Date/Time   COLORURINE YELLOW 12/26/2021 1106   APPEARANCEUR CLEAR 12/26/2021 1106   LABSPEC 1.015 12/26/2021 1106   PHURINE 6.0 12/26/2021 1106   GLUCOSEU NEGATIVE 12/26/2021 1106   HGBUR NEGATIVE 12/26/2021 1106   BILIRUBINUR NEGATIVE 12/26/2021 1106   KETONESUR NEGATIVE 12/26/2021 1106   PROTEINUR NEGATIVE 12/26/2021 1106    NITRITE NEGATIVE 12/26/2021 1106   LEUKOCYTESUR NEGATIVE 12/26/2021 1106    Sepsis Labs: Lactic Acid, Venous    Component Value Date/Time   LATICACIDVEN 1.4 12/26/2021 1412    MICROBIOLOGY: Recent Results (from the past 240 hour(s))  Blood culture (routine x 2)     Status: None   Collection Time: 12/26/21 12:17 PM   Specimen: BLOOD  Result Value Ref Range Status   Specimen Description BLOOD LEFT ANTECUBITAL  Final   Special Requests   Final    BOTTLES DRAWN AEROBIC AND ANAEROBIC Blood Culture results may not be optimal due to an inadequate volume of blood received in culture bottles   Culture   Final    NO GROWTH 5 DAYS Performed at [facility], [address], [place], [place] [address]    Report Status 01/01/2022 FINAL  Final  Blood culture (routine x 2)     Status: None   Collection Time: 12/26/21 12:17 PM   Specimen: BLOOD RIGHT FOREARM  Result Value Ref Range Status   Specimen Description BLOOD RIGHT FOREARM  Final   Special Requests   Final    AEROBIC BOTTLE ONLY Blood Culture results may not be optimal due to an inadequate volume of blood received in culture bottles   Culture   Final    NO GROWTH 5 DAYS Performed at [facility], [address], [place], [place] [address]    Report Status 12/31/2021 FINAL  Final    RADIOLOGY STUDIES/RESULTS: No results found.   LOS: 9 days   Jeoffrey Massed, MD  [facility]    To contact the attending provider between 7A-7P or the covering provider during after hours 7P-7A, please log into the web site [email or web address] and access using universal [place] password for that web site. If you do not have the password, please call the hospital operator.  01/04/2022, 1:46 PM

## 2022-01-04 NOTE — Progress Notes (Signed)
Pt's BP low.  MD notified and bolus running per [facility].  Pt requesting xanax, this RN educated pt on inability to give xanax at this time due to low BP.  Pt verbalized understanding, but continues to request.  MD aware.    01/04/22 0309  Assess: MEWS Score  Temp 98.2 F (36.8 C)  BP (!) 90/52  MAP (mmHg) (!) 64  Pulse Rate 86  ECG Heart Rate 86  Resp 13  Assess: MEWS Score  MEWS Temp 0  MEWS Systolic 1  MEWS Pulse 0  MEWS RR 1  MEWS LOC 0  MEWS Score 2  MEWS Score Color Yellow  Assess: if the MEWS score is Yellow or Red  Were vital signs taken at a resting state? Yes  Focused Assessment No change from prior assessment  Does the patient meet 2 or more of the SIRS criteria? No  MEWS guidelines implemented *See Row Information* No, vital signs rechecked  Document  Progress note created (see row info) Yes  Assess: SIRS CRITERIA  SIRS Temperature  0  SIRS Pulse 0  SIRS Respirations  0  SIRS WBC 0  SIRS Score Sum  0

## 2022-01-05 DIAGNOSIS — R7881 Bacteremia: Secondary | ICD-10-CM | POA: Diagnosis not present

## 2022-01-05 NOTE — Progress Notes (Signed)
PROGRESS NOTE        PATIENT DETAILS Name: Reginald Clarke Age: 31 y.o. Sex: male Date of Birth: 01/06/1991 Admit Date: 12/26/2021 Admitting Physician Orland MustardAllison Wolfe, MD PCP:Pcp, No  Brief Summary: Patient is a 31 y.o.  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  Upon further evaluation with imaging studies-he was found to have T10-T11 level discitis/osteomyelitis and septic emboli in his lungs.  ID is recommending IV Ancef until 7/5.  See below for further details.  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  Underwent angio vac procedure on 4/26.Completed cefazolin on 5/10-planned weekly dalbavancin at the ID clinic.   5/22>> outpatient blood culture positive for MSSA 5/24>> [facility]-worsening back pain  Significant studies: 4/13>> MRI thoracic/MRI lumbar spine: No obvious discitis/osteomyelitis. 4/21>> TEE: EF 60-65%, tricuspid valve vegetation. 5/24>> CTA chest: Cavitary nodule RLL-additional scattered nodules.  No PE. 5/25>> orthopantogram: No periapical abscesses. 5/25>> MRI T-spine: Edema T10-11 endplates-early spinal infection is possible. 5/25>> MRI L-spine: Negative for infection. 5/25>>TTE: EF 60-65%, 1.4 x 1.8 cm mobile mass on the septal tricuspid leaflet.  Significant microbiology data: 4/09>> blood culture: MSSA 4/12>> blood culture: No growth 5/22>> blood culture: MSSA 5/24>> blood culture: No growth  Procedures: 4/26>> debridement of right atrial mass/tricuspid valve vegetation by cardiothoracic surgery.  Consults: ID, IR, cardiothoracic surgery  Subjective: No chest pain.  Back pain is stable.   Anxiety stable on Xanax.  Objective: Vitals: Blood pressure 102/62, pulse 80, temperature 98.1 F (36.7 C), temperature source Oral, resp. rate 18, height 5\' 10"  (1.778 m), weight 76.2 kg, SpO2 98 %.   Exam: Gen Exam:Alert awake-not in any distress HEENT:atraumatic, normocephalic Chest: B/L clear to auscultation anteriorly CVS:S1S2 regular Abdomen:soft non tender, non distended Extremities:no edema Neurology: Non focal Skin: no rash   Pertinent Labs/Radiology:    Latest Ref Rng & Units 01/03/2022    5:30 AM 12/31/2021   12:53 AM 12/29/2021    4:42 AM  CBC  WBC 4.0 - 10.5 K/uL 6.9   5.9   4.5    Hemoglobin 13.0 - 17.0 g/dL 9.4   9.7   09.810.1    Hematocrit 39.0 - 52.0 % 29.3   29.7   31.6    Platelets 150 - 400 K/uL 245   245   196      Lab Results  Component Value Date   NA 140 01/03/2022   K 4.5 01/03/2022   CL 104 01/03/2022   CO2 30 01/03/2022     Assessment/Plan: Persistent MSSA bacteremia with native tricuspid valve endocarditis-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  Per CT surgery-not a candidate for repeat angio vac-reassess for valve replacement in the outpatient setting.  Patient is not a candidate for outpatient IV antibiotics-he is willing to remain inpatient until he completes his IV antibiotic course.   Per ID-continue IV Ancef while inpatient-end date 02/06/2022-followed by 2 weeks of cefdinir-end date 7/19.  Per ID note-please RECONSULT ID PRIOR TO DISCHARGE ON 7/5.  Intractable back pain: Likely due to early T10-11 osteomyelitis-continue as needed narcotics  Chronic pain  syndrome: Continue methadone-on Percocet/NSAIDS for breakthrough pain.    Prolonged QTc: QTc normalized as of EKG on 5/27.  Watch closely-as patient on high-dose methadone.  Attempt to keep K > 4, Mg > 2.    Anxiety disorder: As needed benzodiazepines-stable this morning.  Chronic hepatitis C: Treatment deferred  to the outpatient setting.  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  Tobacco use: Counseled  BMI: Estimated body mass index is 24.11 kg/m as calculated from the following:   Height as of this encounter: 5\' 10"  (1.778 m).   Weight as of this encounter: 76.2 kg.   Code status:   Code Status: Full Code   DVT Prophylaxis: enoxaparin (LOVENOX) injection 40 mg Start: 12/28/21 1100 SCDs Start: 12/26/21 1724   Family Communication: None at bedside-he will update family members himself-he does not want me calling any particular family member.   Disposition Plan: Status is: Inpatient Remains inpatient appropriate because: Persistent MSSA bacteremia-not a candidate for outpatient IV antibiotics-we will remain inpatient until 7/5    Planned Discharge Destination:Home when completes course of IV antibiotics.  Not a candidate for outpatient IV antibiotics.   Diet: Diet Order             Diet regular Room service appropriate? Yes; Fluid consistency: Thin  Diet effective now                     Antimicrobial agents: Anti-infectives (From admission, onward)    Start     Dose/Rate Route Frequency Ordered Stop   12/28/21 2200  ceFAZolin (ANCEF) IVPB 2g/100 mL premix        2 g 200 mL/hr over 30 Minutes Intravenous Every 8 hours 12/28/21 1352 02/06/22 2359   12/27/21 0200  vancomycin (VANCOREADY) IVPB 1750 mg/350 mL  Status:  Discontinued        1,750 mg 175 mL/hr over 120 Minutes Intravenous Every 12 hours 12/26/21 1413 12/28/21 1352   12/26/21 1400  vancomycin (VANCOREADY) IVPB 1750 mg/350 mL        1,750 mg 175 mL/hr over 120 Minutes Intravenous  Once 12/26/21 1354 12/26/21 1645        MEDICATIONS: Scheduled Meds:  enoxaparin (LOVENOX) injection  40 mg Subcutaneous Daily   methadone  135 mg Oral Daily   pantoprazole  40 mg Oral Daily   polyethylene glycol  17 g Oral Daily   sodium chloride flush  3 mL  Intravenous Q12H   Continuous Infusions:  sodium chloride      ceFAZolin (ANCEF) IV 2 g (01/05/22 0538)   PRN Meds:.sodium chloride, acetaminophen **OR** acetaminophen, ALPRAZolam, HYDROcodone-acetaminophen, ibuprofen, methocarbamol, simethicone, sodium chloride flush   I have personally reviewed following labs and imaging studies  LABORATORY DATA: CBC: Recent Labs  Lab 12/31/21 0053 01/03/22 0530  WBC 5.9 6.9  HGB 9.7* 9.4*  HCT 29.7* 29.3*  MCV 91.4 93.6  PLT 245 245     Basic Metabolic Panel: Recent Labs  Lab 12/31/21 0053 01/03/22 0530  NA 139 140  K 3.9 4.5  CL 106 104  CO2 28 30  GLUCOSE 91 82  BUN 5* 8  CREATININE 0.85 0.69  CALCIUM 8.7* 8.9  MG 1.8 1.8     GFR: Estimated Creatinine Clearance: 139.4 mL/min (by C-G formula based on SCr of 0.69 mg/dL).  Liver Function Tests: No results for input(s): AST, ALT, ALKPHOS, BILITOT, PROT, ALBUMIN in the last 168 hours.  No results for input(s):  LIPASE, AMYLASE in the last 168 hours. No results for input(s): AMMONIA in the last 168 hours.  Coagulation Profile: No results for input(s): INR, PROTIME in the last 168 hours.   Cardiac Enzymes: No results for input(s): CKTOTAL, CKMB, CKMBINDEX, TROPONINI in the last 168 hours.  BNP (last 3 results) No results for input(s): PROBNP in the last 8760 hours.  Lipid Profile: No results for input(s): CHOL, HDL, LDLCALC, TRIG, CHOLHDL, LDLDIRECT in the last 72 hours.  Thyroid Function Tests: No results for input(s): TSH, T4TOTAL, FREET4, T3FREE, THYROIDAB in the last 72 hours.   Anemia Panel: No results for input(s): VITAMINB12, FOLATE, FERRITIN, TIBC, IRON, RETICCTPCT in the last 72 hours.   Urine analysis:    Component Value Date/Time   COLORURINE YELLOW 12/26/2021 1106   APPEARANCEUR CLEAR 12/26/2021 1106   LABSPEC 1.015 12/26/2021 1106   PHURINE 6.0 12/26/2021 1106   GLUCOSEU NEGATIVE 12/26/2021 1106   HGBUR NEGATIVE 12/26/2021 1106   BILIRUBINUR  NEGATIVE 12/26/2021 1106   KETONESUR NEGATIVE 12/26/2021 1106   PROTEINUR NEGATIVE 12/26/2021 1106   NITRITE NEGATIVE 12/26/2021 1106   LEUKOCYTESUR NEGATIVE 12/26/2021 1106    Sepsis Labs: Lactic Acid, Venous    Component Value Date/Time   LATICACIDVEN 1.4 12/26/2021 1412    MICROBIOLOGY: Recent Results (from the past 240 hour(s))  Blood culture (routine x 2)     Status: None   Collection Time: 12/26/21 12:17 PM   Specimen: BLOOD  Result Value Ref Range Status   Specimen Description BLOOD LEFT ANTECUBITAL  Final   Special Requests   Final    BOTTLES DRAWN AEROBIC AND ANAEROBIC Blood Culture results may not be optimal due to an inadequate volume of blood received in culture bottles   Culture   Final    NO GROWTH 5 DAYS Performed at [facility], [address] [place], [place] [address]    Report Status 01/01/2022 FINAL  Final  Blood culture (routine x 2)     Status: None   Collection Time: 12/26/21 12:17 PM   Specimen: BLOOD RIGHT FOREARM  Result Value Ref Range Status   Specimen Description BLOOD RIGHT FOREARM  Final   Special Requests   Final    AEROBIC BOTTLE ONLY Blood Culture results may not be optimal due to an inadequate volume of blood received in culture bottles   Culture   Final    NO GROWTH 5 DAYS Performed at [facility], [address] [place], [place] [address]    Report Status 12/31/2021 FINAL  Final    RADIOLOGY STUDIES/RESULTS: No results found.   LOS: 10 days   Jeoffrey Massed, MD  [facility]    To contact the attending provider between 7A-7P or the covering provider during after hours 7P-7A, please log into the web site [email or web address] and access using universal [place] password for that web site. If you do not have the password, please call the hospital operator.  01/05/2022, 10:22 AM

## 2022-01-05 NOTE — Plan of Care (Signed)
  Problem: Clinical Measurements: Goal: Will remain free from infection Outcome: Progressing Goal: Diagnostic test results will improve Outcome: Progressing Goal: Respiratory complications will improve Outcome: Progressing Goal: Cardiovascular complication will be avoided Outcome: Progressing   

## 2022-01-05 NOTE — TOC Progression Note (Signed)
Transition of Care [facility]) - Progression Note    Patient Details  Name: Reginald Clarke MRN: [id number] Date of Birth: 1991-06-18  Transition of [facility]) CM/SW Contact  Lawerance Sabal, RN Phone Number: 01/05/2022, 12:19 PM  Clinical Narrative:    Provided [employer] with clinical updates with CSW over the phone yesterday. Also confirmed that [facility] does accept admissions over the weekend, and would be able to if Berkley Harvey is approved by [employer]. LVM with [facility] rep today to see if they have heard back from [facility] yet, will continue to follow.          Expected Discharge Plan and Services                                                 Social Determinants of Health (SDOH) Interventions    Readmission Risk Interventions     View : No data to display.

## 2022-01-06 DIAGNOSIS — R7881 Bacteremia: Secondary | ICD-10-CM | POA: Diagnosis not present

## 2022-01-06 LAB — CBC
HCT: 30.7 % — ABNORMAL LOW (ref 39.0–52.0)
Hemoglobin: 9.9 g/dL — ABNORMAL LOW (ref 13.0–17.0)
MCH: 30.1 pg (ref 26.0–34.0)
MCHC: 32.2 g/dL (ref 30.0–36.0)
MCV: 93.3 fL (ref 80.0–100.0)
Platelets: 231 10*3/uL (ref 150–400)
RBC: 3.29 MIL/uL — ABNORMAL LOW (ref 4.22–5.81)
RDW: 15.2 % (ref 11.5–15.5)
WBC: 6 10*3/uL (ref 4.0–10.5)
nRBC: 0 % (ref 0.0–0.2)

## 2022-01-06 LAB — BASIC METABOLIC PANEL
Anion gap: 8 (ref 5–15)
BUN: 11 mg/dL (ref 6–20)
CO2: 28 mmol/L (ref 22–32)
Calcium: 9.1 mg/dL (ref 8.9–10.3)
Chloride: 102 mmol/L (ref 98–111)
Creatinine, Ser: 0.76 mg/dL (ref 0.61–1.24)
GFR, Estimated: >60 mL/min (ref >60)
Glucose, Bld: 88 mg/dL (ref 70–99)
Potassium: 4.3 mmol/L (ref 3.5–5.1)
Sodium: 138 mmol/L (ref 135–145)

## 2022-01-06 LAB — MAGNESIUM: Magnesium: 1.8 mg/dL (ref 1.7–2.4)

## 2022-01-06 NOTE — Progress Notes (Signed)
PROGRESS NOTE        PATIENT DETAILS Name: Reginald Clarke Age: 31 y.o. Sex: male Date of Birth: 01-04-1991 Admit Date: 12/26/2021 Admitting Physician Orland Mustard, MD PCP:Pcp, No  Brief Summary: Patient is a 31 y.o.  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  Upon further evaluation with imaging studies-he was found to have T10-T11 level discitis/osteomyelitis and septic emboli in his lungs.  ID is recommending IV Ancef until 7/5.  See below for further details.  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  Underwent angio vac procedure on 4/26.Completed cefazolin on 5/10-planned weekly dalbavancin at the ID clinic.   5/22>> outpatient blood culture positive for MSSA 5/24>> admit to [facility] for persistent bacteremia-worsening back pain  Significant studies: 4/13>> MRI thoracic/MRI lumbar spine: No obvious discitis/osteomyelitis. 4/21>> TEE: EF 60-65%, tricuspid valve vegetation. 5/24>> CTA chest: Cavitary nodule RLL-additional scattered nodules.  No PE. 5/25>> orthopantogram: No periapical abscesses. 5/25>> MRI T-spine: Edema T10-11 endplates-early spinal infection is possible. 5/25>> MRI L-spine: Negative for infection. 5/25>>TTE: EF 60-65%, 1.4 x 1.8 cm mobile mass on the septal tricuspid leaflet.  Significant microbiology data: 4/09>> blood culture: MSSA 4/12>> blood culture: No growth 5/22>> blood culture: MSSA 5/24>> blood culture: No growth  Procedures: 4/26>> debridement of right atrial mass/tricuspid valve vegetation by cardiothoracic surgery.  Consults: ID, IR, cardiothoracic surgery  Subjective:  Patient in bed in no distress, denies  any headache chest or abdominal pain, mid low back pain stable, no weakness in lower extremities, no bowel or bladder incontinence.  Objective: Vitals: Blood pressure (!) 93/52, pulse 84, temperature 97.6 F (36.4 C), temperature source Oral, resp. rate 16, height 5\' 10"  (1.778 m), weight 76.2 kg, SpO2 100 %.   Exam:  Awake Alert, No new F.N deficits, Normal affect [place].AT,PERRAL Supple Neck, No JVD,   Symmetrical Chest wall movement, Good air movement bilaterally, CTAB RRR,No Gallops, Rubs or new Murmurs,  +ve B.Sounds, Abd Soft, No tenderness,   No Cyanosis, Clubbing or edema   Assessment/Plan: Persistent MSSA bacteremia with native tricuspid valve endocarditis-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  Per CT surgery-not a candidate for repeat angio vac-reassess for valve replacement in the outpatient setting.  Patient is not a candidate for outpatient IV antibiotics-he is willing to remain inpatient until he completes his IV antibiotic course.  Will contemplate PICC line placement if he stays here and remains compliant.   Per ID-continue IV Ancef while inpatient-end date 02/06/2022-followed by 2 weeks of cefdinir-end date 7/19.  Per ID note-please RECONSULT ID PRIOR TO DISCHARGE ON 7/5.  Intractable back pain: Likely due to early T10-11 osteomyelitis-continue as needed narcotics  Chronic pain syndrome: Continue methadone-on Percocet/NSAIDS for breakthrough pain.    Prolonged QTc: QTc normalized as of EKG on 5/27.  Watch closely-as patient on high-dose methadone.  Attempt to keep K > 4, Mg > 2.    Anxiety disorder: As needed benzodiazepines-stable this morning.  Chronic hepatitis C: Treatment deferred to the outpatient setting.  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  Tobacco use: Counseled  BMI: Estimated body mass index is 24.11 kg/m as  calculated from the following:   Height as of this encounter: 5\' 10"  (1.778 m).   Weight as of this encounter: 76.2 kg.   Code status:   Code Status: Full Code   DVT Prophylaxis: enoxaparin (LOVENOX) injection 40 mg Start: 12/28/21 1100 SCDs Start: 12/26/21 1724   Family Communication: Male companion at bedside on 01/06/2022   Disposition Plan: Status is: Inpatient Remains inpatient appropriate because: Persistent MSSA bacteremia-not a candidate for outpatient IV antibiotics-we will remain inpatient until 7/5    Planned Discharge Destination:Home when completes course of IV antibiotics.  Not a candidate for outpatient IV antibiotics.   Diet: Diet Order             Diet regular Room service appropriate? Yes; Fluid consistency: Thin  Diet effective now                     Antimicrobial agents: Anti-infectives (From admission, onward)    Start     Dose/Rate Route Frequency Ordered Stop   12/28/21 2200  ceFAZolin (ANCEF) IVPB 2g/100 mL premix        2 g 200 mL/hr over 30 Minutes Intravenous Every 8 hours 12/28/21 1352 02/06/22 2359   12/27/21 0200  vancomycin (VANCOREADY) IVPB 1750 mg/350 mL  Status:  Discontinued        1,750 mg 175 mL/hr over 120 Minutes Intravenous Every 12 hours 12/26/21 1413 12/28/21 1352   12/26/21 1400  vancomycin (VANCOREADY) IVPB 1750 mg/350 mL        1,750 mg 175 mL/hr over 120 Minutes Intravenous  Once 12/26/21 1354 12/26/21 1645        MEDICATIONS: Scheduled Meds:  enoxaparin (LOVENOX) injection  40 mg Subcutaneous Daily   methadone  135 mg Oral Daily   pantoprazole  40 mg Oral Daily   polyethylene glycol  17 g Oral Daily   sodium chloride flush  3 mL Intravenous Q12H   Continuous Infusions:  sodium chloride      ceFAZolin (ANCEF) IV 2 g (01/06/22 0636)   PRN Meds:.sodium chloride, acetaminophen **OR** acetaminophen, ALPRAZolam, HYDROcodone-acetaminophen, ibuprofen, methocarbamol, simethicone, sodium chloride flush   I have  personally reviewed following labs and imaging studies  LABORATORY DATA:  Recent Labs  Lab 12/31/21 0053 01/03/22 0530 01/06/22 0544  WBC 5.9 6.9 6.0  HGB 9.7* 9.4* 9.9*  HCT 29.7* 29.3* 30.7*  PLT 245 245 231  MCV 91.4 93.6 93.3  MCH 29.8 30.0 30.1  MCHC 32.7 32.1 32.2  RDW 13.9 14.8 15.2    Recent Labs  Lab 12/31/21 0053 01/03/22 0530 01/06/22 0544  NA 139 140 138  K 3.9 4.5 4.3  CL 106 104 102  CO2 28 30 28   GLUCOSE 91 82 88  BUN 5* 8 11  CREATININE 0.85 0.69 0.76  CALCIUM 8.7* 8.9 9.1  MG 1.8 1.8 1.8          LOS: 11 days  Signature  03/08/22 M.D on 01/06/2022 at 11:56 AM   -  To page go to [email or web address]

## 2022-01-07 DIAGNOSIS — R7881 Bacteremia: Secondary | ICD-10-CM | POA: Diagnosis not present

## 2022-01-07 MED ORDER — MAGNESIUM SULFATE 2 GM/50ML IV SOLN
2.0000 g | Freq: Once | INTRAVENOUS | Status: AC
  Administered 2022-01-08: 2 g via INTRAVENOUS
  Filled 2022-01-07: qty 50

## 2022-01-07 MED ORDER — MAGNESIUM SULFATE 2 GM/50ML IV SOLN
INTRAVENOUS | Status: AC
  Administered 2022-01-07: 2 g
  Filled 2022-01-07: qty 50

## 2022-01-07 NOTE — Progress Notes (Signed)
PROGRESS NOTE        PATIENT DETAILS Name: Reginald Clarke Age: 31 y.o. Sex: male Date of Birth: [birth year] Admit Date: 12/26/2021 Admitting Physician Orma Flaming, MD PCP:Pcp, No  Brief Summary: Patient is a 31 y.o.  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  Upon further evaluation with imaging studies-he was found to have T10-T11 level discitis/osteomyelitis and septic emboli in his lungs.  ID is recommending IV Ancef until 7/5.  See below for further details.  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  Underwent angio vac procedure on 4/26.Completed cefazolin on 5/10-planned weekly dalbavancin at the ID clinic.   5/22>> outpatient blood culture positive for MSSA 5/24>> admit to [facility] for persistent bacteremia-worsening back pain  Significant studies: 4/13>> MRI thoracic/MRI lumbar spine: No obvious discitis/osteomyelitis. 4/21>> TEE: EF 60-65%, tricuspid valve vegetation. 5/24>> CTA chest: Cavitary nodule RLL-additional scattered nodules.  No PE. 5/25>> orthopantogram: No periapical abscesses. 5/25>> MRI T-spine: Edema T10-11 endplates-early spinal infection is possible. 5/25>> MRI L-spine: Negative for infection. 5/25>>TTE: EF 60-65%, 1.4 x 1.8 cm mobile mass on the septal tricuspid leaflet.  Significant microbiology data: 4/09>> blood culture: MSSA 4/12>> blood culture: No growth 5/22>> blood culture: MSSA 5/24>> blood culture: No growth  Procedures: 4/26>> debridement of right atrial mass/tricuspid valve vegetation by cardiothoracic surgery.  Consults: ID, IR, cardiothoracic surgery   Subjective: Patient in bed, no headache, no chest  pain, back pain is much improved, no new weakness in lower extremities, no bowel or bladder incontinence.   Objective: Vitals: Blood pressure (!) 96/59, pulse 88, temperature 97.6 F (36.4 C), temperature source Oral, resp. rate 16, height 5\' 10"  (1.778 m), weight 76.2 kg, SpO2 96 %.   Exam:  Awake Alert, No new F.N deficits, Normal affect [place].AT,PERRAL Supple Neck, No JVD,   Symmetrical Chest wall movement, Good air movement bilaterally, CTAB RRR,No Gallops, Rubs or new Murmurs,  +ve B.Sounds, Abd Soft, No tenderness,   No Cyanosis, Clubbing or edema    Assessment/Plan:  Persistent MSSA bacteremia with native tricuspid valve endocarditis-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  Per CT surgery-not a candidate for repeat angio vac-reassess for valve replacement in the outpatient setting.  Patient is not a candidate for outpatient IV antibiotics-he is willing to remain inpatient until he completes his IV antibiotic course.  Will contemplate PICC line placement if he stays here and remains compliant.  Per ID-continue IV Ancef while inpatient-end date 02/06/2022-followed by 2 weeks of cefdinir-end date 7/19.  Per ID note-please RECONSULT ID PRIOR TO DISCHARGE ON 02/06/22, discussed with ID Dr. Jerilynn Mages. Shakina Choy on 01/07/2022 not a PICC line candidate even in the hospital as very high risk for AMA.   Intractable back pain: Likely due to early T10-11 osteomyelitis-continue as needed narcotics, overall appears comfortable.  Chronic pain syndrome: Continue methadone-on Percocet/NSAIDS for breakthrough pain.    Prolonged QTc: QTc normalized as of EKG on 5/27.  Watch closely-as patient on high-dose methadone.   Anxiety disorder: As needed benzodiazepines-stable this morning.  Chronic hepatitis C: Treatment deferred to the outpatient setting.  GERD: Continue PPI  History  of IV methamphetamine use: Claims abstinence since last  hospitalization.  Tobacco use: Counseled  BMI: Estimated body mass index is 24.11 kg/m as calculated from the following:   Height as of this encounter: 5\' 10"  (1.778 m).   Weight as of this encounter: 76.2 kg.   Code status:   Code Status: Full Code   DVT Prophylaxis: enoxaparin (LOVENOX) injection 40 mg Start: 12/28/21 1100 SCDs Start: 12/26/21 1724   Family Communication: Male companion at bedside on 01/06/2022   Disposition Plan: Status is: Inpatient Remains inpatient appropriate because: Persistent MSSA bacteremia-not a candidate for outpatient IV antibiotics-we will remain inpatient until 7/5    Planned Discharge Destination:Home when completes course of IV antibiotics.  Not a candidate for outpatient IV antibiotics.   Diet: Diet Order             Diet regular Room service appropriate? Yes; Fluid consistency: Thin  Diet effective now                     Antimicrobial agents: Anti-infectives (From admission, onward)    Start     Dose/Rate Route Frequency Ordered Stop   12/28/21 2200  ceFAZolin (ANCEF) IVPB 2g/100 mL premix        2 g 200 mL/hr over 30 Minutes Intravenous Every 8 hours 12/28/21 1352 02/06/22 2359   12/27/21 0200  vancomycin (VANCOREADY) IVPB 1750 mg/350 mL  Status:  Discontinued        1,750 mg 175 mL/hr over 120 Minutes Intravenous Every 12 hours 12/26/21 1413 12/28/21 1352   12/26/21 1400  vancomycin (VANCOREADY) IVPB 1750 mg/350 mL        1,750 mg 175 mL/hr over 120 Minutes Intravenous  Once 12/26/21 1354 12/26/21 1645        MEDICATIONS: Scheduled Meds:  enoxaparin (LOVENOX) injection  40 mg Subcutaneous Daily   methadone  135 mg Oral Daily   pantoprazole  40 mg Oral Daily   polyethylene glycol  17 g Oral Daily   sodium chloride flush  3 mL Intravenous Q12H   Continuous Infusions:  sodium chloride      ceFAZolin (ANCEF) IV 2 g (01/07/22 0523)   PRN Meds:.sodium chloride, acetaminophen **OR** acetaminophen, ALPRAZolam,  HYDROcodone-acetaminophen, ibuprofen, methocarbamol, simethicone, sodium chloride flush   I have personally reviewed following labs and imaging studies  LABORATORY DATA:  Recent Labs  Lab 01/03/22 0530 01/06/22 0544  WBC 6.9 6.0  HGB 9.4* 9.9*  HCT 29.3* 30.7*  PLT 245 231  MCV 93.6 93.3  MCH 30.0 30.1  MCHC 32.1 32.2  RDW 14.8 15.2    Recent Labs  Lab 01/03/22 0530 01/06/22 0544  NA 140 138  K 4.5 4.3  CL 104 102  CO2 30 28  GLUCOSE 82 88  BUN 8 11  CREATININE 0.69 0.76  CALCIUM 8.9 9.1  MG 1.8 1.8       LOS: 12 days  Signature  Lala Lund M.D on 01/07/2022 at 10:54 AM   -  To page go to [email or web address]

## 2022-01-07 NOTE — Progress Notes (Signed)
Destination Service Provider Request Status Selected Services Address Phone Fax Patient Preferred  [facility] Preferred SNF  Declined  Acuity too high N/A [address], [place] [address] [phone] --  [facility] Preferred SNF  Declined N/A [address] [place] [address] [phone] --  [facility] Preferred SNF  Declined N/A [address], [place] [address] [phone] --  [facility] Preferred SNF  Declined  Cost of care N/A [address], [place] [address] [phone] --  [facility] Preferred SNF  Declined  Age N/A [address], [place] [address] [phone] --  [facility] [place] [facility]  Declined  Acuity too high N/A [address], [place] [address] [phone] --  [facility] Preferred SNF  Declined N/A [address] [place] [address] [phone] --  [facility]  Declined  Cannot meet patient's needs N/A [address], [place] [address] [phone] --  [facility] Preferred SNF  Declined N/A [address] [place] [address] [phone] --  [facility] Preferred [facility]  Declined N/A [id number] [place] [address] [phone] --  [facility]  Declined  Acuity too high N/A [address], [place] [address] [phone] --  [facility]  Declined  Insurance not in network N/A [address] [place] [address] [phone] --

## 2022-01-08 ENCOUNTER — Ambulatory Visit: Payer: BLUE CROSS/BLUE SHIELD | Admitting: Infectious Diseases

## 2022-01-08 ENCOUNTER — Inpatient Hospital Stay (HOSPITAL_COMMUNITY): Payer: BLUE CROSS/BLUE SHIELD

## 2022-01-08 ENCOUNTER — Ambulatory Visit: Payer: BLUE CROSS/BLUE SHIELD

## 2022-01-08 DIAGNOSIS — R7881 Bacteremia: Secondary | ICD-10-CM | POA: Diagnosis not present

## 2022-01-08 IMAGING — DX DG ORTHOPANTOGRAM /PANORAMIC
1 series · 1 of 1 positions shown · non-contrast
Comparison: [DATE]

CLINICAL DATA: Jaw pain.

EXAM:
ORTHOPANTOGRAM/PANORAMIC

[view not recorded]
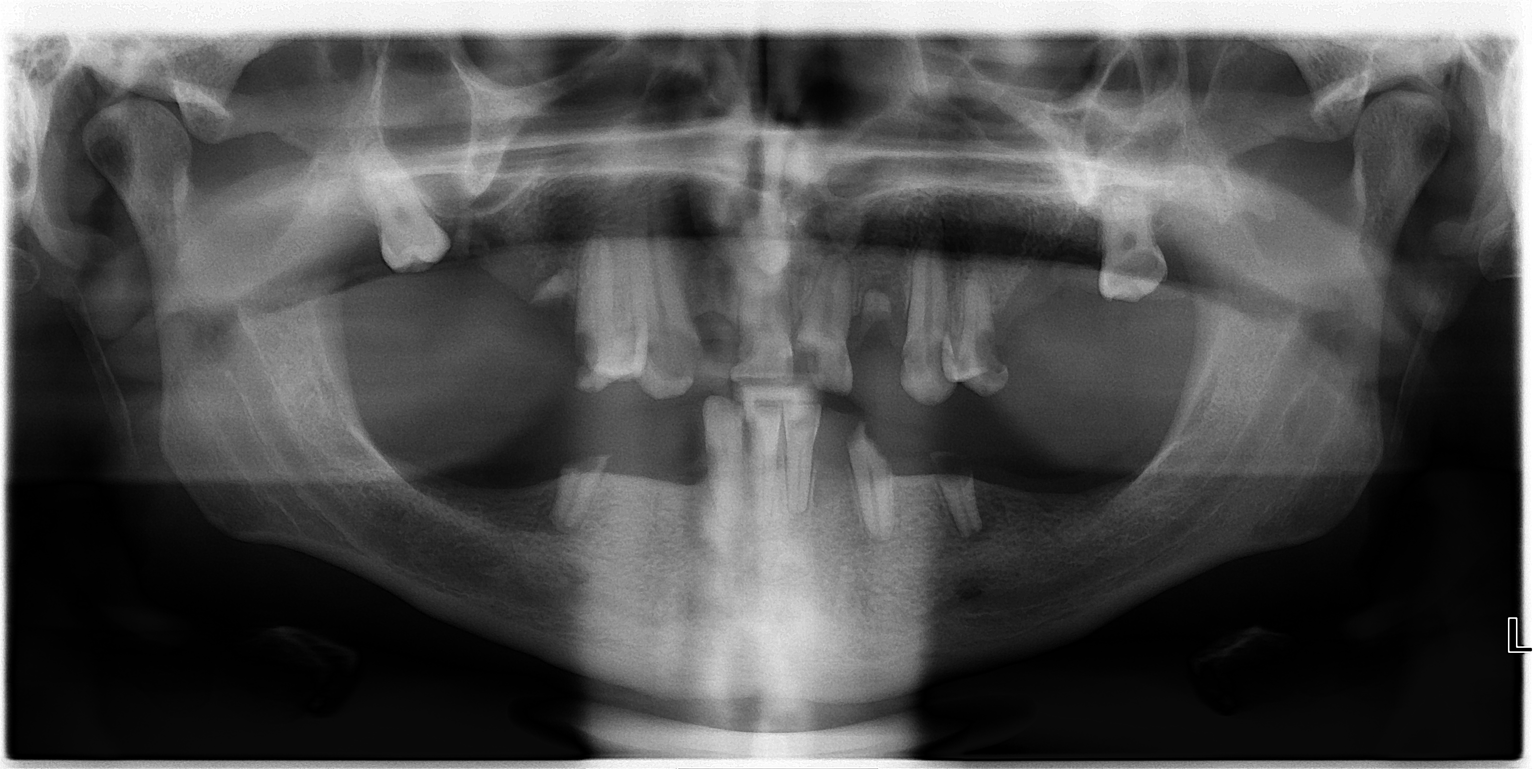

[1 of 1 positions shown; findings below may reference images not displayed]

FINDINGS: Stable appearing extensive dental caries and multiple missing teeth.
Areas of periapical lucency consistent with periapical disease.
IMPRESSION: Extensive dental caries and periapical disease.

## 2022-01-08 MED ORDER — KETOROLAC TROMETHAMINE 30 MG/ML IJ SOLN
30.0000 mg | Freq: Once | INTRAMUSCULAR | Status: AC
  Administered 2022-01-08: 30 mg via INTRAVENOUS
  Filled 2022-01-08: qty 1

## 2022-01-08 NOTE — Progress Notes (Signed)
PROGRESS NOTE        PATIENT DETAILS Name: Reginald Clarke Age: 31 y.o. Sex: male Date of Birth: February 02, 1991 Admit Date: 12/26/2021 Admitting Physician Reginald Mustard, MD PCP:Pcp, No  Brief Summary: Patient is a 31 y.o.  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  Upon further evaluation with imaging studies-he was found to have T10-T11 level discitis/osteomyelitis and septic emboli in his lungs.  ID is recommending IV Ancef until 7/5.  See below for further details.  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  Underwent angio vac procedure on 4/26.Completed cefazolin on 5/10-planned weekly dalbavancin at the ID clinic.   5/22>> outpatient blood culture positive for MSSA 5/24>> admit to [facility] for persistent bacteremia-worsening back pain  Significant studies: 4/13>> MRI thoracic/MRI lumbar spine: No obvious discitis/osteomyelitis. 4/21>> TEE: EF 60-65%, tricuspid valve vegetation. 5/24>> CTA chest: Cavitary nodule RLL-additional scattered nodules.  No PE. 5/25>> orthopantogram: No periapical abscesses. 5/25>> MRI T-spine: Edema T10-11 endplates-early spinal infection is possible. 5/25>> MRI L-spine: Negative for infection. 5/25>>TTE: EF 60-65%, 1.4 x 1.8 cm mobile mass on the septal tricuspid leaflet.  Significant microbiology data: 4/09>> blood culture: MSSA 4/12>> blood culture: No growth 5/22>> blood culture: MSSA 5/24>> blood culture: No growth  Procedures: 4/26>> debridement of right atrial mass/tricuspid valve vegetation by cardiothoracic surgery.  Consults: ID, IR, cardiothoracic surgery   Subjective: Patient in bed appears to be in no  distress whatsoever, no headache, no chest or abdominal pain, he sounds very sleepy, he complained to the nurse that he was having to take however could not point out exactly where he was hurting when I saw him, pointed towards his right lower jaw which appears stable on exam.  Was not tender to palpate.  Objective: Vitals: Blood pressure (!) 102/57, pulse 83, temperature 97.7 F (36.5 C), temperature source Oral, resp. rate 16, height 5\' 10"  (1.778 m), weight 76.2 kg, SpO2 96 %.   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  Per CT surgery-not a candidate for repeat angio vac-reassess for valve replacement in the outpatient setting.  Patient is not a candidate for outpatient IV antibiotics-he is willing to remain inpatient until he completes his IV antibiotic course.  Will contemplate PICC line placement if he stays here and remains compliant.  Per ID-continue IV Ancef while inpatient-end date 02/06/2022-followed by 2 weeks of cefdinir-end date 7/19.  Per ID note-please RECONSULT ID PRIOR TO DISCHARGE ON 02/06/22, discussed with ID Dr. 04/09/22. Reginald Clarke on 01/07/2022 not a PICC line candidate even in the hospital as very high risk for AMA.   Intractable back pain: Likely due to early T10-11 osteomyelitis-continue as needed narcotics, overall appears comfortable.  Chronic pain syndrome: Continue methadone-on  Percocet/NSAIDS for breakthrough pain.    Tooth ache.  Check orthopantogram, 1 dose of Toradol on  01/08/2022.    Prolonged QTc: QTc normalized as of EKG on 5/27.  Watch closely-as patient on high-dose methadone.   Anxiety disorder: As needed benzodiazepines-stable this morning.  Chronic hepatitis C: Treatment deferred to the outpatient setting.  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  Tobacco use: Counseled  BMI: Estimated body mass index is 24.11 kg/m as calculated from the following:   Height as of this encounter: 5\' 10"  (1.778 m).   Weight as of this encounter: 76.2 kg.   Code status:   Code Status: Full Code   DVT Prophylaxis: enoxaparin (LOVENOX) injection 40 mg Start: 12/28/21 1100 SCDs Start: 12/26/21 1724   Family Communication: Male companion at bedside on 01/06/2022   Disposition Plan: Status is: Inpatient Remains inpatient appropriate because: Persistent MSSA bacteremia-not a candidate for outpatient IV antibiotics-we will remain inpatient until 7/5    Planned Discharge Destination:Home when completes course of IV antibiotics.  Not a candidate for outpatient IV antibiotics.   Diet: Diet Order             Diet regular Room service appropriate? Yes; Fluid consistency: Thin  Diet effective now                     Antimicrobial agents: Anti-infectives (From admission, onward)    Start     Dose/Rate Route Frequency Ordered Stop   12/28/21 2200  ceFAZolin (ANCEF) IVPB 2g/100 mL premix        2 g 200 mL/hr over 30 Minutes Intravenous Every 8 hours 12/28/21 1352 02/07/22 0729   12/27/21 0200  vancomycin (VANCOREADY) IVPB 1750 mg/350 mL  Status:  Discontinued        1,750 mg 175 mL/hr over 120 Minutes Intravenous Every 12 hours 12/26/21 1413 12/28/21 1352   12/26/21 1400  vancomycin (VANCOREADY) IVPB 1750 mg/350 mL        1,750 mg 175 mL/hr over 120 Minutes Intravenous  Once 12/26/21 1354 12/26/21 1645        MEDICATIONS: Scheduled Meds:  enoxaparin (LOVENOX) injection  40 mg  Subcutaneous Daily   methadone  135 mg Oral Daily   pantoprazole  40 mg Oral Daily   polyethylene glycol  17 g Oral Daily   sodium chloride flush  3 mL Intravenous Q12H   Continuous Infusions:  sodium chloride      ceFAZolin (ANCEF) IV 2 g (01/08/22 0957)   PRN Meds:.sodium chloride, acetaminophen **OR** acetaminophen, ALPRAZolam, HYDROcodone-acetaminophen, ibuprofen, methocarbamol, simethicone, sodium chloride flush   I have personally reviewed following labs and imaging studies  LABORATORY DATA:  Recent Labs  Lab 01/03/22 0530 01/06/22 0544  WBC 6.9 6.0  HGB 9.4* 9.9*  HCT 29.3* 30.7*  PLT 245 231  MCV 93.6 93.3  MCH 30.0 30.1  MCHC 32.1 32.2  RDW 14.8 15.2    Recent Labs  Lab 01/03/22 0530 01/06/22 0544  NA 140 138  K 4.5 4.3  CL 104 102  CO2 30 28  GLUCOSE 82 88  BUN 8 11  CREATININE 0.69 0.76  CALCIUM 8.9 9.1  MG 1.8 1.8      LOS: 13 days  Signature  03/08/22 M.D on 01/08/2022 at 10:46 AM   -  To page go to [email or web address]

## 2022-01-08 NOTE — Plan of Care (Signed)
  Problem: Education: Goal: Knowledge of General Education information will improve Description: Including pain rating scale, medication(s)/side effects and non-pharmacologic comfort measures Outcome: Progressing   Problem: Nutrition: Goal: Adequate nutrition will be maintained Outcome: Progressing   Problem: Pain Managment: Goal: General experience of comfort will improve Outcome: Progressing   Problem: Skin Integrity: Goal: Risk for impaired skin integrity will decrease Outcome: Progressing   

## 2022-01-09 DIAGNOSIS — R7881 Bacteremia: Secondary | ICD-10-CM | POA: Diagnosis not present

## 2022-01-09 NOTE — Progress Notes (Signed)
PROGRESS NOTE        PATIENT DETAILS Name: Reginald Clarke Age: 31 y.o. Sex: male Date of Birth: 1991-01-06 Admit Date: 12/26/2021 Admitting Physician Orma Flaming, MD PCP:Pcp, No  Brief Summary: Patient is a 31 y.o.  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  Upon further evaluation with imaging studies-he was found to have T10-T11 level discitis/osteomyelitis and septic emboli in his lungs.  ID is recommending IV Ancef until 7/5.  See below for further details.  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  Underwent angio vac procedure on 4/26.Completed cefazolin on 5/10-planned weekly dalbavancin at the ID clinic.   5/22>> outpatient blood culture positive for MSSA 5/24>> admit to [facility] for persistent bacteremia-worsening back pain  Significant studies: 4/13>> MRI thoracic/MRI lumbar spine: No obvious discitis/osteomyelitis. 4/21>> TEE: EF 60-65%, tricuspid valve vegetation. 5/24>> CTA chest: Cavitary nodule RLL-additional scattered nodules.  No PE. 5/25>> orthopantogram: No periapical abscesses. 5/25>> MRI T-spine: Edema T10-11 endplates-early spinal infection is possible. 5/25>> MRI L-spine: Negative for infection. 5/25>>TTE: EF 60-65%, 1.4 x 1.8 cm mobile mass on the septal tricuspid leaflet.  Significant microbiology data: 4/09>> blood culture: MSSA 4/12>> blood culture: No growth 5/22>> blood culture: MSSA 5/24>> blood culture: No growth  Procedures: 4/26>> debridement of right atrial mass/tricuspid valve vegetation by cardiothoracic surgery.  Consults: ID, IR, cardiothoracic surgery   Subjective: Patient in bed, appears comfortable ,  denies any headache, no fever, no chest pain or pressure, no shortness of breath , no abdominal pain. No new focal weakness. No dental pain.   Objective: Vitals: Blood pressure (!) 98/52, pulse (!) 110, temperature 99 F (37.2 C), temperature source Oral, resp. rate 16, height 5\' 10"  (1.778 m), weight 76.2 kg, SpO2 94 %.   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  Per CT surgery-not a candidate for repeat angio vac-reassess for valve replacement in the outpatient setting.  Patient is not a candidate for outpatient IV antibiotics-he is willing to remain inpatient until he completes his IV antibiotic course.  Will contemplate PICC line placement if he stays here and remains compliant.  Per ID-continue IV Ancef while inpatient-end date 02/06/2022-followed by 2 weeks of cefdinir-end date 7/19.  Per ID note-please RECONSULT ID PRIOR TO DISCHARGE ON 02/06/22, discussed with ID Dr. Jerilynn Mages. Lorane Cousar on 01/07/2022 not a PICC line candidate even in the hospital as very high risk for AMA.   Intractable back pain: Likely due to early T10-11 osteomyelitis-continue as needed narcotics, overall appears comfortable.  Chronic pain syndrome: Continue methadone-on Percocet/NSAIDS for breakthrough pain.    Tooth ache.  Chronic poor dentition stable orthopantogram with no acute abscess, does have dental caries and periapical disease due to poor dental hygiene, missing multiple teeth, outpatient dental follow-up.  Prolonged QTc:  QTc normalized as of EKG on 5/27.  Watch closely-as patient  on high-dose methadone.   Anxiety disorder: As needed benzodiazepines-stable this morning.  Chronic hepatitis C: Treatment deferred to the outpatient setting.  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  Tobacco use: Counseled  BMI: Estimated body mass index is 24.11 kg/m as calculated from the following:   Height as of this encounter: 5\' 10"  (1.778 m).   Weight as of this encounter: 76.2 kg.   Code status:   Code Status: Full Code   DVT Prophylaxis: enoxaparin (LOVENOX) injection 40 mg Start: 12/28/21 1100 SCDs Start: 12/26/21 1724   Family Communication: Male companion at bedside on 01/06/2022   Disposition Plan: Status is: Inpatient Remains inpatient appropriate because: Persistent MSSA bacteremia-not a candidate for outpatient IV antibiotics-we will remain inpatient until 7/5    Planned Discharge Destination:Home when completes course of IV antibiotics.  Not a candidate for outpatient IV antibiotics.   Diet: Diet Order             Diet regular Room service appropriate? Yes; Fluid consistency: Thin  Diet effective now                     Antimicrobial agents: Anti-infectives (From admission, onward)    Start     Dose/Rate Route Frequency Ordered Stop   12/28/21 2200  ceFAZolin (ANCEF) IVPB 2g/100 mL premix        2 g 200 mL/hr over 30 Minutes Intravenous Every 8 hours 12/28/21 1352 02/07/22 0729   12/27/21 0200  vancomycin (VANCOREADY) IVPB 1750 mg/350 mL  Status:  Discontinued        1,750 mg 175 mL/hr over 120 Minutes Intravenous Every 12 hours 12/26/21 1413 12/28/21 1352   12/26/21 1400  vancomycin (VANCOREADY) IVPB 1750 mg/350 mL        1,750 mg 175 mL/hr over 120 Minutes Intravenous  Once 12/26/21 1354 12/26/21 1645        MEDICATIONS: Scheduled Meds:  enoxaparin (LOVENOX) injection  40 mg Subcutaneous Daily   methadone  135 mg Oral Daily   pantoprazole  40 mg Oral Daily    polyethylene glycol  17 g Oral Daily   sodium chloride flush  3 mL Intravenous Q12H   Continuous Infusions:  sodium chloride 10 mL/hr at 01/09/22 0400    ceFAZolin (ANCEF) IV 2 g (01/09/22 0732)   PRN Meds:.sodium chloride, acetaminophen **OR** acetaminophen, ALPRAZolam, HYDROcodone-acetaminophen, ibuprofen, methocarbamol, simethicone, sodium chloride flush   I have personally reviewed following labs and imaging studies  LABORATORY DATA:  Recent Labs  Lab 01/03/22 0530 01/06/22 0544  WBC 6.9 6.0  HGB 9.4* 9.9*  HCT 29.3* 30.7*  PLT 245 231  MCV 93.6 93.3  MCH 30.0 30.1  MCHC 32.1 32.2  RDW 14.8 15.2    Recent Labs  Lab 01/03/22 0530 01/06/22 0544  NA 140 138  K 4.5 4.3  CL 104 102  CO2 30 28  GLUCOSE 82 88  BUN 8 11  CREATININE 0.69 0.76  CALCIUM 8.9 9.1  MG 1.8 1.8     LOS: 14 days  Signature  Lala Lund M.D on 01/09/2022 at 9:49 AM   -  To page go to [email or web address]

## 2022-01-10 DIAGNOSIS — R7881 Bacteremia: Secondary | ICD-10-CM | POA: Diagnosis not present

## 2022-01-10 MED ORDER — IBUPROFEN 600 MG PO TABS: 600.0000 mg | ORAL_TABLET | Freq: Three times a day (TID) | ORAL | 0 refills | Status: DC | PRN

## 2022-01-10 MED ORDER — HYDROCODONE-ACETAMINOPHEN 5-325 MG PO TABS: 1.0000 | ORAL_TABLET | Freq: Four times a day (QID) | ORAL | 0 refills | Status: DC | PRN

## 2022-01-10 MED ORDER — CEFAZOLIN SODIUM-DEXTROSE 2-4 GM/100ML-% IV SOLN: 2.0000 g | Freq: Three times a day (TID) | INTRAVENOUS | 0 refills | Status: AC

## 2022-01-10 NOTE — Progress Notes (Signed)
Discharge report called to [facility] at [phone], spoke with receiving nurse Marisue Ivan, RN.  AVS will be printed and given to patient prior to transport.

## 2022-01-10 NOTE — TOC Transition Note (Signed)
Transition of Care [facility]) - CM/SW Discharge Note   Patient Details  Name: Reginald Clarke MRN: [id number] Date of Birth: 10/15/1990  [facility]) CM/SW Contact:  Harriet Masson, RN Phone Number: 01/10/2022, 12:33 PM   Clinical Narrative:    Patient agreeable to transfer to [facility] for the continuation of his IV antibiotics. Irving Burton with [facility] notified this RNCM of the insurance authorization.  Patient will be admitted to room 318. Bedside nurse to call report to [phone] or [phone]. Admitting MD is Dr. Ellsworth Lennox.  Safe transport arranged for transportation  No other TOC needs at this time.  Final next level of care: Long Term Acute Care (LTAC) Barriers to Discharge: Barriers Resolved   Patient Goals and CMS Choice        Discharge Placement               [facility]        Discharge Plan and Services               LTAC                  Social Determinants of Health (SDOH) Interventions     Readmission Risk Interventions     No data to display

## 2022-01-10 NOTE — Discharge Instructions (Signed)
Follow with Primary MD in 7 days   Get CBC, CMP, Magnesium -  checked next visit within 1 week by LTAC MD    Activity: As tolerated with Full fall precautions use walker/cane & assistance as needed  Disposition LTAC  Diet: Heart Healthy    Special Instructions: If you have smoked or chewed Tobacco  in the last 2 yrs please stop smoking, stop any regular Alcohol  and or any Recreational drug use.  On your next visit with your primary care physician please Get Medicines reviewed and adjusted.  Please request your Prim.MD to go over all Hospital Tests and Procedure/Radiological results at the follow up, please get all Hospital records sent to your Prim MD by signing hospital release before you go home.  If you experience worsening of your admission symptoms, develop shortness of breath, life threatening emergency, suicidal or homicidal thoughts you must seek medical attention immediately by calling 911 or calling your MD immediately  if symptoms less severe.  You Must read complete instructions/literature along with all the possible adverse reactions/side effects for all the Medicines you take and that have been prescribed to you. Take any new Medicines after you have completely understood and accpet all the possible adverse reactions/side effects.

## 2022-01-10 NOTE — Progress Notes (Signed)
PROGRESS NOTE        PATIENT DETAILS Name: Reginald Clarke Age: 31 y.o. Sex: male Date of Birth: 1991-05-23 Admit Date: 12/26/2021 Admitting Physician Reginald Mustard, MD PCP:Pcp, No  Brief Summary: Patient is a 31 y.o.  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  Upon further evaluation with imaging studies-he was found to have T10-T11 level discitis/osteomyelitis and septic emboli in his lungs.  ID is recommending IV Ancef until 7/5.  See below for further details.  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  Underwent angio vac procedure on 4/26.Completed cefazolin on 5/10-planned weekly dalbavancin at the ID clinic.   5/22>> outpatient blood culture positive for MSSA 5/24>> admit to [facility] for persistent bacteremia-worsening back pain  Significant studies: 4/13>> MRI thoracic/MRI lumbar spine: No obvious discitis/osteomyelitis. 4/21>> TEE: EF 60-65%, tricuspid valve vegetation. 5/24>> CTA chest: Cavitary nodule RLL-additional scattered nodules.  No PE. 5/25>> orthopantogram: No periapical abscesses. 5/25>> MRI T-spine: Edema T10-11 endplates-early spinal infection is possible. 5/25>> MRI L-spine: Negative for infection. 5/25>>TTE: EF 60-65%, 1.4 x 1.8 cm mobile mass on the septal tricuspid leaflet.  Significant microbiology data: 4/09>> blood culture: MSSA 4/12>> blood culture: No growth 5/22>> blood culture: MSSA 5/24>> blood culture: No growth  Procedures: 4/26>> debridement of right atrial mass/tricuspid valve vegetation by cardiothoracic surgery.  Consults: ID, IR, cardiothoracic surgery   Subjective: Patient in bed, appears comfortable,  denies any headache, no fever, no chest pain or pressure, no shortness of breath , no abdominal pain. No new focal weakness.  Back pain is better controlled.  No jaw pain.  Objective: Vitals: Blood pressure (!) 91/57, pulse 75, temperature 98.3 F (36.8 C), temperature source Oral, resp. rate 17, height 5\' 10"  (1.778 m), weight 76.2 kg, SpO2 100 %.   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  Per CT surgery-not a candidate for repeat angio vac-reassess for valve replacement in the outpatient setting.  Patient is not a candidate for outpatient IV antibiotics-he is willing to remain inpatient until he completes his IV antibiotic course.  Will contemplate PICC line placement if he stays here and remains compliant.  Per ID-continue IV Ancef while inpatient-end date 02/06/2022-followed by 2 weeks of cefdinir-end date 7/19.  Per ID note-please RECONSULT ID PRIOR TO DISCHARGE ON 02/06/22, discussed with ID Reginald Clarke. Reginald Clarke not a PICC line candidate even in the hospital as very high risk for AMA.   Intractable back pain: Likely due to early T10-11 osteomyelitis-continue as needed narcotics, overall appears comfortable.  Chronic pain syndrome: Continue methadone-on Percocet/NSAIDS for breakthrough pain.    Tooth ache.  Chronic poor dentition stable orthopantogram with no acute abscess, does have dental caries and periapical disease due to poor dental hygiene, missing multiple teeth, outpatient  dental follow-up.  Prolonged QTc: QTc normalized as of EKG on  5/27.  Watch closely-as patient on high-dose methadone.  QTc normalized on 01/10/2022 with 473 ms.  Anxiety disorder: As needed benzodiazepines-stable this morning.  Chronic hepatitis C: Treatment deferred to the outpatient setting.  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  Tobacco use: Counseled  BMI: Estimated body mass index is 24.11 kg/m as calculated from the following:   Height as of this encounter: 5\' 10"  (1.778 m).   Weight as of this encounter: 76.2 kg.   Code status:   Code Status: Full Code   DVT Prophylaxis: enoxaparin (LOVENOX) injection 40 mg Start: 12/28/21 1100 SCDs Start: 12/26/21 1724   Family Communication: Male companion at bedside on 01/06/2022   Disposition Plan: Status is: Inpatient Remains inpatient appropriate because: Persistent MSSA bacteremia-not a candidate for outpatient IV antibiotics-we will remain inpatient until 7/5    Planned Discharge Destination:Home when completes course of IV antibiotics.  Not a candidate for outpatient IV antibiotics.   Diet: Diet Order             Diet regular Room service appropriate? Yes; Fluid consistency: Thin  Diet effective now                     Antimicrobial agents: Anti-infectives (From admission, onward)    Start     Dose/Rate Route Frequency Ordered Stop   12/28/21 2200  ceFAZolin (ANCEF) IVPB 2g/100 mL premix        2 g 200 mL/hr over 30 Minutes Intravenous Every 8 hours 12/28/21 1352 02/07/22 0729   12/27/21 0200  vancomycin (VANCOREADY) IVPB 1750 mg/350 mL  Status:  Discontinued        1,750 mg 175 mL/hr over 120 Minutes Intravenous Every 12 hours 12/26/21 1413 12/28/21 1352   12/26/21 1400  vancomycin (VANCOREADY) IVPB 1750 mg/350 mL        1,750 mg 175 mL/hr over 120 Minutes Intravenous  Once 12/26/21 1354 12/26/21 1645        MEDICATIONS: Scheduled Meds:  enoxaparin (LOVENOX) injection  40 mg Subcutaneous Daily    methadone  135 mg Oral Daily   pantoprazole  40 mg Oral Daily   polyethylene glycol  17 g Oral Daily   sodium chloride flush  3 mL Intravenous Q12H   Continuous Infusions:  sodium chloride Stopped (01/09/22 0909)    ceFAZolin (ANCEF) IV 2 g (01/10/22 03/12/22)   PRN Meds:.sodium chloride, acetaminophen **OR** acetaminophen, ALPRAZolam, HYDROcodone-acetaminophen, ibuprofen, methocarbamol, simethicone, sodium chloride flush   I have personally reviewed following labs and imaging studies  LABORATORY DATA:  Recent Labs  Lab 01/06/22 0544  WBC 6.0  HGB 9.9*  HCT 30.7*  PLT 231  MCV 93.3  MCH 30.1  MCHC 32.2  RDW 15.2    Recent Labs  Lab 01/06/22 0544  NA 138  K 4.3  CL 102  CO2 28  GLUCOSE 88  BUN 11  CREATININE 0.76  CALCIUM 9.1  MG 1.8     LOS: 15 days  Signature  03/08/22 M.D on 01/10/2022 at 7:59 AM   -  To page go to [email or web address]

## 2022-01-10 NOTE — Plan of Care (Signed)
  Problem: Education: Goal: Knowledge of General Education information will improve Description: Including pain rating scale, medication(s)/side effects and non-pharmacologic comfort measures Outcome: Adequate for Discharge   Problem: Health Behavior/Discharge Planning: Goal: Ability to manage health-related needs will improve Outcome: Adequate for Discharge   Problem: Clinical Measurements: Goal: Will remain free from infection Outcome: Adequate for Discharge Goal: Diagnostic test results will improve Outcome: Adequate for Discharge Goal: Respiratory complications will improve Outcome: Adequate for Discharge Goal: Cardiovascular complication will be avoided Outcome: Adequate for Discharge   Problem: Activity: Goal: Risk for activity intolerance will decrease Outcome: Adequate for Discharge   Problem: Nutrition: Goal: Adequate nutrition will be maintained Outcome: Adequate for Discharge   Problem: Pain Managment: Goal: General experience of comfort will improve Outcome: Adequate for Discharge   Problem: Skin Integrity: Goal: Risk for impaired skin integrity will decrease Outcome: Adequate for Discharge

## 2022-01-10 NOTE — Discharge Summary (Signed)
Reginald Clarke [id number] DOB: 21-Oct-1990 DOA: 12/26/2021  PCP: Pcp, No  Admit date: 12/26/2021  Discharge date: 01/10/2022  Admitted From: Home   Disposition:  [facility]   Recommendations for Outpatient Follow-up:   Follow up with PCP in 1-2 weeks  PCP Please obtain BMP/CBC, 2 view CXR in 1week,  (see Discharge instructions)   PCP Please follow up on the following pending results:    Home Health: None   Equipment/Devices: None  Consultations: ID, cardiothoracic surgery Discharge Condition: Stable    CODE STATUS: Full    Diet Recommendation: Heart Healthy   Diet Order             Diet - low sodium heart healthy           Diet regular Room service appropriate? Yes; Fluid consistency: Thin  Diet effective now                    Chief Complaint  Patient presents with   Back Pain   Nausea     Brief history of present illness from the day of admission and additional interim summary    31 y.o.  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  Upon further evaluation with imaging studies-he was found to have T10-T11 level discitis/osteomyelitis and septic emboli in his lungs.  ID is recommending IV Ancef until 7/5.  See below for further details.   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  Underwent angio vac procedure on 4/26.Completed cefazolin on 5/10-planned weekly dalbavancin at the ID clinic.   5/22>> outpatient blood culture positive for MSSA 5/24>> admit to [facility] for persistent  bacteremia-worsening back pain   Significant studies: 4/13>> MRI thoracic/MRI lumbar spine: No obvious discitis/osteomyelitis. 4/21>> TEE: EF 60-65%, tricuspid valve vegetation. 5/24>> CTA chest: Cavitary nodule RLL-additional scattered nodules.  No PE. 5/25>> orthopantogram: No periapical abscesses. 5/25>> MRI T-spine: Edema T10-11 endplates-early spinal infection is possible. 5/25>> MRI L-spine: Negative for infection. 5/25>>TTE: EF 60-65%, 1.4 x 1.8 cm mobile mass on the septal tricuspid leaflet.   Significant microbiology data: 4/09>> blood culture: MSSA 4/12>> blood culture: No growth 5/22>> blood culture: MSSA 5/24>> blood culture: No growth   Procedures: 4/26>> debridement of right atrial mass/tricuspid valve vegetation by cardiothoracic surgery.   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  Per CT surgery-not a candidate for repeat angio vac-reassess for valve replacement in the outpatient setting.  Patient is not a candidate for outpatient IV antibiotics-he is willing to remain inpatient until he completes his IV antibiotic course.  Will contemplate PICC line placement if he stays here and remains compliant.   Per ID-continue IV Ancef while inpatient-end date 02/06/2022-followed by 2 weeks of cefdinir-end date 02/20/22.  Must follow with ID within 2 weeks from today and cardiothoracic surgery within a month from today.  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   Chronic pain syndrome: Continue methadone-on  Percocet/NSAIDS for breakthrough pain.    Tooth ache.  Chronic poor dentition stable orthopantogram with no acute abscess, does have dental caries and periapical disease due to poor dental hygiene, missing multiple teeth, outpatient dental follow-up.   Prolonged QTc: QTc normalized as of EKG on 5/27.  Watch closely-as patient on high-dose methadone.  QTc normalized on 01/10/2022 with 473 ms.   Anxiety disorder: As needed benzodiazepines-stable.   Chronic hepatitis C: Treatment deferred to the outpatient setting.   GERD: Continue PPI   History of IV methamphetamine use: Claims abstinence since last hospitalization.   Tobacco use: Counseled to quit.   Discharge diagnosis     Principal Problem:   Bacteremia with gram positive cocci  Active Problems:   Endocarditis of tricuspid valve   Septic pulmonary embolism ([name])   Chronic pain syndrome/methadone abuse    Prolonged QT interval   Anxiety   Hepatitis C, chronic ([name])   GERD (gastroesophageal reflux disease)   Normocytic anemia   Polysubstance abuse ([name])   Tobacco abuse    Discharge instructions    Discharge Instructions     Diet - low sodium heart healthy   Complete by: As directed    Discharge instructions   Complete by: As directed    Follow with Primary MD in 7 days   Get CBC, CMP, Magnesium -  checked next visit within 1 week by LTAC MD    Activity: As tolerated with Full fall precautions use walker/cane & assistance as needed  Disposition LTAC  Diet: Heart Healthy    Special Instructions: If you have smoked or chewed Tobacco  in the last 2 yrs please stop smoking, stop any regular Alcohol  and or any Recreational drug use.  On your next visit with your primary care physician please Get Medicines reviewed and adjusted.  Please request your Prim.MD to go over all Hospital Tests and Procedure/Radiological results at the follow up, please get all Hospital records sent to your Prim MD by signing hospital release  before you go home.  If you experience worsening of your admission symptoms, develop shortness of breath, life threatening emergency, suicidal or homicidal thoughts you must seek medical attention immediately by calling 911 or calling your MD immediately  if symptoms less severe.  You Must read complete instructions/literature along with all the possible adverse reactions/side effects for all the Medicines you take and that have been prescribed to you. Take any new Medicines after you have completely understood and accpet all the possible adverse reactions/side effects.   Increase activity slowly   Complete by: As directed        Discharge Medications   Allergies as of 01/10/2022  Reactions   Penicillins Hives   Amoxil [amoxicillin Trihydrate]         Medication List     TAKE these medications    ALPRAZolam 0.25 MG tablet Commonly known as: XANAX Take 1 tablet (0.25 mg total) by mouth 2 (two) times daily as needed for anxiety.   ceFAZolin 2-4 GM/100ML-% IVPB Commonly known as: ANCEF Inject 100 mLs (2 g total) into the vein every 8 (eight) hours for 27 days.   HYDROcodone-acetaminophen 5-325 MG tablet Commonly known as: NORCO/VICODIN Take 1 tablet by mouth every 6 (six) hours as needed for moderate pain.   ibuprofen 600 MG tablet Commonly known as: ADVIL Take 1 tablet (600 mg total) by mouth every 8 (eight) hours as needed for moderate pain.   methadone 1 MG/1ML solution Commonly known as: DOLOPHINE Take 135 mg by mouth daily.   methocarbamol 500 MG tablet Commonly known as: ROBAXIN Take 1 tablet (500 mg total) by mouth every 8 (eight) hours as needed for muscle spasms.   pantoprazole 40 MG tablet Commonly known as: PROTONIX Take 1 tablet (40 mg total) by mouth daily.         Follow-up Information     Laurice Record, MD. Schedule an appointment as soon as possible for a visit in 2 week(s).   Specialty: Infectious Diseases Contact information: [address], [place] [address] [phone]         Melrose Nakayama, MD. Schedule an appointment as soon as possible for a visit in 1 month(s).   Specialty: Cardiothoracic Surgery Contact information: [address] [place]  [address] [phone]                 Major procedures and Radiology Reports - PLEASE review detailed and final reports thoroughly  -      DG Orthopantogram  Result Date: 01/08/2022 CLINICAL DATA:  Jaw pain. EXAM: ORTHOPANTOGRAM/PANORAMIC COMPARISON:  12/27/2021 FINDINGS: Stable appearing extensive dental caries and multiple missing teeth. Areas of periapical lucency consistent with periapical disease. IMPRESSION: Extensive dental caries and periapical disease. Electronically Signed   By: Marijo Sanes M.D.   On: 01/08/2022 11:41   DG Orthopantogram  Result Date: 12/27/2021 CLINICAL DATA:  Bacteremia EXAM: ORTHOPANTOGRAM/PANORAMIC COMPARISON:  None Available. FINDINGS: Multiple missing mandibular and maxillary teeth. Multiple remaining teeth demonstrate dental caries. No visible periapical abscess. No acute bony abnormality. IMPRESSION: Multiple missing teeth with multiple dental caries within the remaining teeth. No periapical abscess. Electronically Signed   By: Rolm Baptise M.D.   On: 12/27/2021 19:15   ECHOCARDIOGRAM COMPLETE BUBBLE STUDY  Result Date: 12/27/2021    ECHOCARDIOGRAM REPORT   Patient Name:   Reginald Clarke Date of Exam: 12/27/2021 Medical Rec #:  [id number]    Height:       70.0 in Accession #:    [id number]   Weight:       168.0 lb Date of Birth:  01/05/1991    BSA:          1.938 m Patient Age:    30 years     BP:           126/77 mmHg Patient Gender: M            HR:           101 bpm. Exam Location:  Inpatient Procedure: 2D Echo, Cardiac Doppler and Color Doppler Indications:    Bacteremia  History:  Patient has prior history of Echocardiogram examinations, most                 recent 11/28/2021. H/O  angiovac for TV vegetation.  Sonographer:    Merrie Roof RDCS Referring Phys: [id number] [place]  1. Left ventricular ejection fraction, by estimation, is 60 to 65%. The left ventricle has normal function. The left ventricle has no regional wall motion abnormalities. Left ventricular diastolic parameters were normal.  2. Right ventricular systolic function is normal. The right ventricular size is normal. There is normal pulmonary artery systolic pressure. The estimated right ventricular systolic pressure is 123456 mmHg.  3. The mitral valve is normal in structure. No evidence of mitral valve regurgitation. No evidence of mitral stenosis.  4. There is a 1.4 x 1.8 cm moible mass associated with the septal tricuspid leaflet, consistent with vegetation. The tricuspid valve is abnormal. Tricuspid valve regurgitation is mild to moderate.  5. The aortic valve is tricuspid. Aortic valve regurgitation is not visualized. No aortic stenosis is present.  6. The inferior vena cava is normal in size with greater than 50% respiratory variability, suggesting right atrial pressure of 3 mmHg. Comparison(s): Changes from prior study are noted. 11/28/2021: TEE -demonstrated TV vegetations on the septal and anterior leaflets- the septal leaflet vegetation appears to have increased in size. FINDINGS  Left Ventricle: Left ventricular ejection fraction, by estimation, is 60 to 65%. The left ventricle has normal function. The left ventricle has no regional wall motion abnormalities. The left ventricular internal cavity size was normal in size. There is  no left ventricular hypertrophy. Left ventricular diastolic parameters were normal. Right Ventricle: The right ventricular size is normal. No increase in right ventricular wall thickness. Right ventricular systolic function is normal. There is normal pulmonary artery systolic pressure. The tricuspid regurgitant velocity is 2.74 m/s, and  with an assumed right atrial pressure of 3  mmHg, the estimated right ventricular systolic pressure is 123456 mmHg. Left Atrium: Left atrial size was normal in size. Right Atrium: Right atrial size was normal in size. Pericardium: There is no evidence of pericardial effusion. Mitral Valve: The mitral valve is normal in structure. No evidence of mitral valve regurgitation. No evidence of mitral valve stenosis. Tricuspid Valve: There is a 1.4 x 1.8 cm moible mass associated with the septal tricuspid leaflet, consistent with vegetation. The tricuspid valve is abnormal. Tricuspid valve regurgitation is mild to moderate. No evidence of tricuspid stenosis. Aortic Valve: The aortic valve is tricuspid. Aortic valve regurgitation is not visualized. No aortic stenosis is present. Aortic valve mean gradient measures 6.0 mmHg. Aortic valve peak gradient measures 11.0 mmHg. Pulmonic Valve: The pulmonic valve was not well visualized. Pulmonic valve regurgitation is not visualized. No evidence of pulmonic stenosis. Aorta: The aortic root is normal in size and structure. Venous: The inferior vena cava is normal in size with greater than 50% respiratory variability, suggesting right atrial pressure of 3 mmHg. IAS/Shunts: No atrial level shunt detected by color flow Doppler.  LEFT VENTRICLE PLAX 2D LVIDd:         4.70 cm LVIDs:         3.00 cm LV PW:         0.80 cm LV IVS:        0.90 cm  RIGHT VENTRICLE RV S prime:     13.70 cm/s TAPSE (M-mode): 2.9 cm LEFT ATRIUM             Index LA diam:  4.10 cm 2.12 cm/m LA Vol (A2C):   63.0 ml 32.50 ml/m LA Vol (A4C):   42.0 ml 21.67 ml/m LA Biplane Vol: 51.0 ml 26.31 ml/m  AORTIC VALVE AV Vmax:           166.00 cm/s AV Vmean:          112.000 cm/s AV VTI:            0.266 m AV Peak Grad:      11.0 mmHg AV Mean Grad:      6.0 mmHg LVOT Vmax:         130.00 cm/s LVOT Vmean:        87.500 cm/s LVOT VTI:          0.226 m LVOT/AV VTI ratio: 0.85  AORTA Ao Root diam: 3.00 cm Ao Asc diam:  2.30 cm MITRAL VALVE                 TRICUSPID VALVE MV Area (PHT): 4.49 cm     TR Peak grad:   30.0 mmHg MV Decel Time: 169 msec     TR Vmax:        274.00 cm/s MV E velocity: 102.00 cm/s MV A velocity: 92.90 cm/s   SHUNTS MV E/A ratio:  1.10         Systemic VTI: 0.23 m Lyman Bishop MD Electronically signed by Lyman Bishop MD Signature Date/Time: 12/27/2021/6:23:34 PM    Final    MR THORACIC SPINE W WO CONTRAST  Result Date: 12/27/2021 CLINICAL DATA:  Low back pain with infection suspected. Positive x-ray/CT EXAM: MRI THORACIC AND LUMBAR SPINE WITHOUT AND WITH CONTRAST TECHNIQUE: Multiplanar and multiecho pulse sequences of the thoracic and lumbar spine were obtained without and with intravenous contrast. CONTRAST:  62mL GADAVIST GADOBUTROL 1 MMOL/ML IV SOLN COMPARISON:  11/15/2021 FINDINGS: MRI THORACIC SPINE FINDINGS Alignment:  Physiologic. Vertebrae: Mild marrow edema leftward at the level of the T10-11 endplate with mild adjacent paravertebral edema/enhancement. No disc collapse and only minimal associated disc T2 hyperintensity. Cord: Normal signal and morphology. No evidence of spinal canal collection. Paraspinal and other soft tissues: Trace bilateral pleural effusion. Nodular opacities in the bilateral lungs, recently evaluated by CT. Disc levels: Small disc protrusions at T5-6, T6-7, and T8-9. Negative facets. No neural impingement. MRI LUMBAR SPINE FINDINGS Segmentation:  Standard. Alignment:  Physiologic. Vertebrae:  No fracture, evidence of discitis, or bone lesion. Conus medullaris: Extends to the L1-2 level and appears normal. Paraspinal and other soft tissues: Negative for perispinal mass or inflammation Disc levels: Diffusely preserved disc height and hydration. No facet spurring. No neural impingement. IMPRESSION: 1. Minimal marrow edema about the left aspect of the T10-11 endplates, early spinal infection is possible given absence on comparison from last month. No spinal canal collection. 2. Negative lumbar spine.  Electronically Signed   By: Jorje Guild M.D.   On: 12/27/2021 13:25   MR Lumbar Spine W Wo Contrast  Result Date: 12/27/2021 CLINICAL DATA:  Low back pain with infection suspected. Positive x-ray/CT EXAM: MRI THORACIC AND LUMBAR SPINE WITHOUT AND WITH CONTRAST TECHNIQUE: Multiplanar and multiecho pulse sequences of the thoracic and lumbar spine were obtained without and with intravenous contrast. CONTRAST:  38mL GADAVIST GADOBUTROL 1 MMOL/ML IV SOLN COMPARISON:  11/15/2021 FINDINGS: MRI THORACIC SPINE FINDINGS Alignment:  Physiologic. Vertebrae: Mild marrow edema leftward at the level of the T10-11 endplate with mild adjacent paravertebral edema/enhancement. No disc collapse and only minimal associated disc T2 hyperintensity. Cord: Normal signal  and morphology. No evidence of spinal canal collection. Paraspinal and other soft tissues: Trace bilateral pleural effusion. Nodular opacities in the bilateral lungs, recently evaluated by CT. Disc levels: Small disc protrusions at T5-6, T6-7, and T8-9. Negative facets. No neural impingement. MRI LUMBAR SPINE FINDINGS Segmentation:  Standard. Alignment:  Physiologic. Vertebrae:  No fracture, evidence of discitis, or bone lesion. Conus medullaris: Extends to the L1-2 level and appears normal. Paraspinal and other soft tissues: Negative for perispinal mass or inflammation Disc levels: Diffusely preserved disc height and hydration. No facet spurring. No neural impingement. IMPRESSION: 1. Minimal marrow edema about the left aspect of the T10-11 endplates, early spinal infection is possible given absence on comparison from last month. No spinal canal collection. 2. Negative lumbar spine. Electronically Signed   By: Jorje Guild M.D.   On: 12/27/2021 13:25   CT Angio Chest PE W and/or Wo Contrast  Result Date: 12/26/2021 CLINICAL DATA:  Pt arrived POV from home c/o back pin and nausea x1 week. Pt states he had blood cultures drawn through infectious disease and they  called stating they were positive and to come here. EXAM: CT ANGIOGRAPHY CHEST WITH CONTRAST TECHNIQUE: Multidetector CT imaging of the chest was performed using the standard protocol during bolus administration of intravenous contrast. Multiplanar CT image reconstructions and MIPs were obtained to evaluate the vascular anatomy. RADIATION DOSE REDUCTION: This exam was performed according to the departmental dose-optimization program which includes automated exposure control, adjustment of the mA and/or kV according to patient size and/or use of iterative reconstruction technique. CONTRAST:  122mL OMNIPAQUE IOHEXOL 350 MG/ML SOLN COMPARISON:  None Available. FINDINGS: Cardiovascular: No filling defects within the pulmonary arteries to suggest acute pulmonary embolism. Heart appears normal. Mediastinum/Nodes: No axillary or supraclavicular adenopathy. No mediastinal or hilar adenopathy. No pericardial fluid. Esophagus normal. Lungs/Pleura: Cavitary nodule with gas and fluid at the RIGHT lung base measures 2.3 x 1.5 cm (105/7). Solid nodule in the RIGHT lower lobe measures 6 mm on image 101/7). Semi-solid nodule in the LEFT lower lobe measures 7 mm on image 7/7. Solid nodule along the RIGHT horizontal fissure measures 7 mm image 62. Solid nodule in LEFT upper lobe measures 3 mm on image 29/7. Smaller additional peripheral subpleural nodules in the RIGHT lung on image 74/7 Upper Abdomen: Limited view of the liver, kidneys, pancreas are unremarkable. Normal adrenal glands. Musculoskeletal: No aggressive osseous lesion. Review of the MIP images confirms the above findings. IMPRESSION: 1. Cavitary nodule with fluid and gas in the RIGHT lower lobe is concerning for pulmonary infection. 2. Additional scattered pulmonary nodules in the LEFT and RIGHT lung are concerning for early pulmonary infections. 3. Similar pattern of pulmonary nodules in the lower lobes on CT abdomen 11/11/2021. 4. No pulmonary embolism.  Heart appears  normal. Electronically Signed   By: Suzy Bouchard M.D.   On: 12/26/2021 14:56   DG Chest 2 View  Result Date: 12/26/2021 CLINICAL DATA:  Provided history: Shortness of breath. EXAM: CHEST - 2 VIEW COMPARISON:  Chest radiographs 12/22/2021 and earlier FINDINGS: Heart size within normal limits. Small ill-defined opacity within the right lung base, similar to the prior chest radiograph 12/22/2021. No appreciable airspace consolidation within the left lung. No sizable pleural effusion. No evidence of pneumothorax. No acute bony abnormality identified. IMPRESSION: Small ill-defined opacity within the right lung base, similar to the prior chest radiographs of 12/22/2021. This may reflect scarring or incompletely resolved right lower lobe pneumonia. Electronically Signed   By: Kellie Simmering  D.O.   On: 12/26/2021 12:41   DG Chest 2 View  Result Date: 12/22/2021 CLINICAL DATA:  Fever EXAM: CHEST - 2 VIEW COMPARISON:  None Available. FINDINGS: Stable right basilar scarring. Lungs are otherwise clear. No pneumothorax or pleural effusion. Cardiac size within normal limits. Pulmonary vascularity is normal. No acute bone abnormality. IMPRESSION: No active cardiopulmonary disease. Electronically Signed   By: Fidela Salisbury M.D.   On: 12/22/2021 21:14       Today   Subjective    Reginald Clarke today has no headache,no chest abdominal pain,no new weakness tingling or numbness, feels much better wants to go home today.    Objective   Blood pressure 102/60, pulse 75, temperature 98.3 F (36.8 C), temperature source Oral, resp. rate 17, height 5\' 10"  (1.778 m), weight 76.2 kg, SpO2 100 %.   Intake/Output Summary (Last 24 hours) at 01/10/2022 1120 Last data filed at 01/09/2022 2300 Gross per 24 hour  Intake 1249.59 ml  Output --  Net 1249.59 ml    Exam  Awake Alert, No new F.N deficits, poor dental hygiene with multiple missing teeth, [place].AT,PERRAL Supple Neck,   Symmetrical Chest wall movement, Good air  movement bilaterally, CTAB RRR,No Gallops,   +ve B.Sounds, Abd Soft, Non tender,  No Cyanosis, Clubbing or edema    Data Review   Recent Labs  Lab 01/06/22 0544  WBC 6.0  HGB 9.9*  HCT 30.7*  PLT 231  MCV 93.3  MCH 30.1  MCHC 32.2  RDW 15.2    Recent Labs  Lab 01/06/22 0544  NA 138  K 4.3  CL 102  CO2 28  GLUCOSE 88  BUN 11  CREATININE 0.76  CALCIUM 9.1  MG 1.8    Total Time in preparing paper work, data evaluation and todays exam - 28 minutes  Lala Lund M.D on 01/10/2022 at 11:20 AM  [facility]

## 2022-01-11 LAB — ACID FAST CULTURE WITH REFLEXED SENSITIVITIES (MYCOBACTERIA): Acid Fast Culture: NEGATIVE

## 2022-02-11 ENCOUNTER — Telehealth: Payer: Self-pay

## 2022-02-11 NOTE — Telephone Encounter (Signed)
Patient stated he got better - Haves a NP apt with office on 02/14/22 at 130pm -    Patient Name: Reginald Clarke Gender: Male DOB: 02-09-1991 Age: 31 Y 22 D Return Phone Number: [phone] (Primary), [phone] (Secondary) Address: City/ State/ Zip: [place] [address] Client [facility] - Human resources officer [facility] Contact Type Call Who Is Calling Patient / Member / Family / Caregiver Call Type Triage / Clinical Caller Name n/a Relationship To Patient Self Return Phone Number [phone] (Primary) Chief Complaint Skin Lesion - Moles/ Lumps/ Growths Reason for Call Symptomatic / Request for Health Information Initial Comment Caller states he has blisters that are itchy on his feet that kinda swollen. Caller states it is the size of marble. Translation No Nurse Assessment Nurse: Iona Coach, RN, Christina Date/Time (Eastern Time): 02/09/2022 12:13:54 PM Confirm and document reason for call. If symptomatic, describe symptoms. ---Caller states he has blisters that are itchy on his feet that swollen. Caller states it is the size of marble and between his toes. This has increased in size since yesterday. Pt states it looks like poison oak but has had no exposure. Denies any other symptoms at this time. Pt has been on antibiotics for colitis and was hospitalized for the past 10 weeks. Does the patient have any new or worsening symptoms? ---Yes Will a triage be completed? ---Yes Related visit to physician within the last 2 weeks? ---No Does the PT have any chronic conditions? (i.e. diabetes, asthma, this includes High risk factors for pregnancy, etc.) ---Yes List chronic conditions. ---colitis, GERD, anxiety, muscle spasms. Cefdinir Is this a behavioral health or substance abuse call? ---No Guidelines Guideline Title Affirmed Question Affirmed Notes Nurse Date/Time (Eastern Time) Skin Lump or Localized  Swelling Looks like a boil, infected sore, deep Iona Coach, RN, Trula Ore 02/09/2022 12:19:57 PM PLEASE NOTE: All timestamps contained within this report are represented as [place] Standard Time. CONFIDENTIALTY NOTICE: This fax transmission is intended only for the addressee. It contains information that is legally privileged, confidential or otherwise protected from use or disclosure. If you are not the intended recipient, you are strictly prohibited from reviewing, disclosing, copying using or disseminating any of this information or taking any action in reliance on or regarding this information. If you have received this fax in error, please notify us immediately by telephone so that we can arrange for its return to [place]. Phone: [phone], Toll-Free: [phone], Fax: [phone] Page: 2 of 2 Call Id: [id number] Guidelines Guideline Title Affirmed Question Affirmed Notes Nurse Date/Time Lamount Cohen Time) ulcer or other infected rash Disp. Time Lamount Cohen Time) Disposition Final User 02/09/2022 12:27:35 PM See PCP within 24 Hours Yes Iona Coach, RN, Trula Ore Final Disposition 02/09/2022 12:27:35 PM See PCP within 24 Hours Yes Iona Coach, RN, [place] Disagree/Comply Comply Caller Understands Yes PreDisposition Did not know what to do Care Advice Given Per Guideline SEE PCP WITHIN 24 HOURS: * IF OFFICE WILL BE CLOSED: You need to be seen within the next 24 hours. A clinic or an urgent care center is often a good source of care if your doctor's office is closed or you can't get an appointment. CALL BACK IF: * Severe pain occurs * Fever occurs * You become worse CARE ADVICE given per Skin Swelling or Lump (Adult) guideline. Comments User: Raina Mina, RN Date/Time Lamount Cohen Time): 02/09/2022 12:22:23 PM 97.44F orally Referrals GO TO FACILITY UNDECIDED

## 2022-02-14 ENCOUNTER — Telehealth: Payer: Self-pay | Admitting: Physician Assistant

## 2022-02-14 ENCOUNTER — Ambulatory Visit (INDEPENDENT_AMBULATORY_CARE_PROVIDER_SITE_OTHER): Payer: BLUE CROSS/BLUE SHIELD | Admitting: Physician Assistant

## 2022-02-14 ENCOUNTER — Encounter: Payer: Self-pay | Admitting: Physician Assistant

## 2022-02-14 VITALS — BP 108/62 | HR 98 | Temp 97.8°F | Ht 70.0 in | Wt 173.8 lb

## 2022-02-14 DIAGNOSIS — B182 Chronic viral hepatitis C: Secondary | ICD-10-CM | POA: Diagnosis not present

## 2022-02-14 DIAGNOSIS — I079 Rheumatic tricuspid valve disease, unspecified: Secondary | ICD-10-CM

## 2022-02-14 DIAGNOSIS — B86 Scabies: Secondary | ICD-10-CM

## 2022-02-14 DIAGNOSIS — R7881 Bacteremia: Secondary | ICD-10-CM | POA: Diagnosis not present

## 2022-02-14 DIAGNOSIS — F1120 Opioid dependence, uncomplicated: Secondary | ICD-10-CM

## 2022-02-14 DIAGNOSIS — D508 Other iron deficiency anemias: Secondary | ICD-10-CM

## 2022-02-14 DIAGNOSIS — J181 Lobar pneumonia, unspecified organism: Secondary | ICD-10-CM

## 2022-02-14 DIAGNOSIS — I2690 Septic pulmonary embolism without acute cor pulmonale: Secondary | ICD-10-CM

## 2022-02-14 LAB — CBC WITH DIFFERENTIAL/PLATELET
Basophils Absolute: 0 10*3/uL (ref 0.0–0.1)
Basophils Relative: 0.2 % (ref 0.0–3.0)
Eosinophils Absolute: 0.1 10*3/uL (ref 0.0–0.7)
Eosinophils Relative: 1 % (ref 0.0–5.0)
HCT: 33.9 % — ABNORMAL LOW (ref 39.0–52.0)
Hemoglobin: 11 g/dL — ABNORMAL LOW (ref 13.0–17.0)
Lymphocytes Relative: 9.9 % — ABNORMAL LOW (ref 12.0–46.0)
Lymphs Abs: 1.1 10*3/uL (ref 0.7–4.0)
MCHC: 32.4 g/dL (ref 30.0–36.0)
MCV: 85.7 fl (ref 78.0–100.0)
Monocytes Absolute: 0.7 10*3/uL (ref 0.1–1.0)
Monocytes Relative: 5.9 % (ref 3.0–12.0)
Neutro Abs: 9.2 10*3/uL — ABNORMAL HIGH (ref 1.4–7.7)
Neutrophils Relative %: 83 % — ABNORMAL HIGH (ref 43.0–77.0)
Platelets: 171 10*3/uL (ref 150.0–400.0)
RBC: 3.96 Mil/uL — ABNORMAL LOW (ref 4.22–5.81)
RDW: 15.6 % — ABNORMAL HIGH (ref 11.5–15.5)
WBC: 11.1 10*3/uL — ABNORMAL HIGH (ref 4.0–10.5)

## 2022-02-14 LAB — COMPREHENSIVE METABOLIC PANEL
ALT: 22 U/L (ref 0–53)
AST: 33 U/L (ref 0–37)
Albumin: 3.9 g/dL (ref 3.5–5.2)
Alkaline Phosphatase: 96 U/L (ref 39–117)
BUN: 6 mg/dL (ref 6–23)
CO2: 29 mEq/L (ref 19–32)
Calcium: 9.2 mg/dL (ref 8.4–10.5)
Chloride: 99 mEq/L (ref 96–112)
Creatinine, Ser: 0.63 mg/dL (ref 0.40–1.50)
GFR: 127.14 mL/min (ref >60.00)
Glucose, Bld: 96 mg/dL (ref 70–99)
Potassium: 3.8 mEq/L (ref 3.5–5.1)
Sodium: 134 mEq/L — ABNORMAL LOW (ref 135–145)
Total Bilirubin: 0.8 mg/dL (ref 0.2–1.2)
Total Protein: 7.6 g/dL (ref 6.0–8.3)

## 2022-02-14 MED ORDER — PERMETHRIN 5 % EX CREA: 1.0000 | TOPICAL_CREAM | Freq: Once | CUTANEOUS | 1 refills | Status: AC

## 2022-02-14 NOTE — Telephone Encounter (Signed)
Caller states the prescription listed below was "only one tube" and they need another because they have to treat 6 people in the house.   Caller requests a return call with how to move forward.   permethrin (ELIMITE) 5 % cream [[id number]]    [facility] [address] [place] - [address]  [address], [place] [address]  Phone:  [phone]  Fax:  [phone]

## 2022-02-14 NOTE — Patient Instructions (Addendum)
Use the permethrin cream as directed Labs today Refer to cardiology Keep regular f/up with [facility] and ID

## 2022-02-14 NOTE — Progress Notes (Signed)
Subjective:    Patient ID: Reginald Clarke, male    DOB: [birth year], 31 y.o.   MRN: [id number]  Chief Complaint  Patient presents with   [place] pt coming in to establish care; last saw PCP in high school; pt states he has Endocarditis dx 12 wks ago and in need of a valve replacement, Needs to meet with ID team and Cardiology referral; outbreak of poision oak/ivy    HPI 31 y.o. patient presents today for new patient establishment with me.  Patient hasn't seen PCP in awhile. Here with GF, [name]. Lives with father and two grandparents.   Current Care Team: Infectious disease at [place] referral to cardiology - endocarditis - still on antibiotics  [facility] in [place] - Rx Methadone past 3 years  Concerns: -Recurrent hospitalizations, most recent from 12/26/21 - 01/10/22; native tricuspid endocarditis / MSSA bacteremia; T10-T11 discitis / osteomyelitis, septic emboli in lung; IV Ancef -Still following with ID, needing to see cardiothoracic surgery -Needing labs and CXR updated -New itchy rash on patient developed over the last month; also GF & father with same itchy rash on hands / feet / legs.    Past Medical History:  Diagnosis Date   Anxiety    Endocarditis    Hepatitis    hx of hep C per pt    Past Surgical History:  Procedure Laterality Date   APPLICATION OF ANGIOVAC N/A 11/28/2021   Procedure: APPLICATION OF ANGIOVAC;  Surgeon: Lajuana Matte, MD;  Location: [place];  Service: Vascular;  Laterality: N/A;   TEE WITHOUT CARDIOVERSION N/A 11/23/2021   Procedure: TRANSESOPHAGEAL ECHOCARDIOGRAM (TEE);  Surgeon: Werner Lean, MD;  [facility] END[facility]COPY;  Service: Cardiovascular;  Laterality: N/A;   TEE WITHOUT CARDIOVERSION N/A 11/28/2021   Procedure: TRANSESOPHAGEAL ECHOCARDIOGRAM (TEE);  Surgeon: Lajuana Matte, MD;  Location: [place];  Service: Open Heart Surgery;  Laterality: N/A;   tubes in ears     WRIST FRACTURE SURGERY       History reviewed. No pertinent family history.  Social History   Tobacco Use   Smoking status: Every Day    Types: Cigarettes   Smokeless tobacco: Never  Vaping Use   Vaping Use: Never used  Substance Use Topics   Alcohol use: No   Drug use: No     Allergies  Allergen Reactions   Penicillins Hives   Amoxil [Amoxicillin Trihydrate]     Review of Systems NEGATIVE UNLESS OTHERWISE INDICATED IN HPI      Objective:     BP 108/62 (BP Location: Right Arm)   Pulse 98   Temp 97.8 F (36.6 C) (Temporal)   Ht 5\' 10"  (1.778 m)   Wt 173 lb 12.8 oz (78.8 kg)   SpO2 99%   BMI 24.94 kg/m   Wt Readings from Last 3 Encounters:  02/14/22 173 lb 12.8 oz (78.8 kg)  12/26/21 168 lb (76.2 kg)  12/24/21 166 lb (75.3 kg)    BP Readings from Last 3 Encounters:  02/14/22 108/62  01/10/22 (!) 91/57  12/24/21 125/70     Physical Exam Vitals and nursing note reviewed.  Constitutional:      General: He is not in acute distress.    Appearance: Normal appearance. He is not toxic-appearing.  HENT:     Head: Normocephalic and atraumatic.     Mouth/Throat:     Comments: Poor dentition Eyes:     Extraocular Movements: Extraocular movements intact.  Conjunctiva/sclera: Conjunctivae normal.     Pupils: Pupils are equal, round, and reactive to light.  Cardiovascular:     Rate and Rhythm: Normal rate and regular rhythm.     Pulses: Normal pulses.     Heart sounds: Normal heart sounds.  Pulmonary:     Effort: Pulmonary effort is normal.     Breath sounds: Normal breath sounds.  Musculoskeletal:        General: Normal range of motion.     Cervical back: Normal range of motion and neck supple.  Skin:    General: Skin is warm and dry.     Findings: Rash (scabitic rash feet, legs, hands) present.  Neurological:     General: No focal deficit present.     Mental Status: He is alert and oriented to person, place, and time.  Psychiatric:        Mood and Affect: Mood normal.         Behavior: Behavior normal.        Assessment & Plan:   Problem List Items Addressed This Visit       Cardiovascular and Mediastinum   Endocarditis of tricuspid valve - Primary   Relevant Orders   Ambulatory referral to Cardiothoracic Surgery   Septic pulmonary embolism [facility])   Relevant Orders   Ambulatory referral to Cardiothoracic Surgery     Digestive   Hepatitis C, chronic ([name])   Relevant Orders   Comprehensive metabolic panel (Completed)     Other   Methadone dependence ([name])   Bacteremia with gram positive cocci    Relevant Orders   Ambulatory referral to Cardiothoracic Surgery   Other Visit Diagnoses     Scabies       Other iron deficiency anemia       Relevant Orders   CBC with Differential/Platelet (Completed)   Iron, TIBC and Ferritin Panel   Right lower lobe consolidation ([name])       Relevant Orders   DG Chest 2 View        Meds ordered this encounter  Medications   permethrin (ELIMITE) 5 % cream    Sig: Apply 1 Application topically once for 1 dose. Thoroughly massage cream from head to soles of feet; leave on for 8 to 14 hours (basically put on before bed and then shower in AM) before removing (shower or bath). While you have this on- go ahead and clean all clothes/sheets in hot water. Repeat treatment in 14 days and let us know if this works or not    Dispense:  60 g    Refill:  1    Order Specific Question:   Supervising Provider    Answer:   Shelva Majestic [[id number]]   PLAN: -New pt establishment, doing better since hosp discharge -Needing labs/ CXR updated -Refer to cardiothoracic surgery -Cont f/up with ID and [facility] -Tx scabies with Permethrin cream -Psych referral strong consideration; will need to address anxiety treatment, don't want him on Xanax long-term -Will continue close f/up; pt & GF to reach out with any concerns / questions   Return in about 3 weeks (around 03/07/2022) for recheck / med refill .  This note was  prepared with assistance of Conservation officer, historic buildings. Occasional wrong-word or sound-a-like substitutions may have occurred due to the inherent limitations of voice recognition software.  Time Spent: 49 minutes of total time was spent on the date of the encounter performing the following actions: chart review prior to seeing the patient,  obtaining history, performing a medically necessary exam, counseling on the treatment plan, placing orders, and documenting in our EHR.     Ronit Cranfield M Wai Minotti, PA-C

## 2022-02-14 NOTE — Telephone Encounter (Signed)
Contacted pt and advised other occupants of the home would need to contact their PCP for treatment. Pt verbalized understanding

## 2022-02-15 ENCOUNTER — Telehealth: Payer: Self-pay | Admitting: Physician Assistant

## 2022-02-15 LAB — IRON,TIBC AND FERRITIN PANEL
%SAT: 6 % (calc) — ABNORMAL LOW (ref 20–48)
Ferritin: 129 ng/mL (ref 38–380)
Iron: 15 ug/dL — ABNORMAL LOW (ref 50–180)
TIBC: 244 mcg/dL (calc) — ABNORMAL LOW (ref 250–425)

## 2022-02-15 NOTE — Telephone Encounter (Signed)
Rowland Lathe from Cardiothoracic surgery called back in regards to Alyssa's previous call. States that patient had a procedure with Dr. Cliffton Asters in April that he did not complete a needed follow up for. At this time she would need to know if he still has a pick line and if he doesn't she would need to know if he completed 2 weeks of oral abx per a previous hospital note. Darl Pikes goes on to state that he will need to be referred to infectious disease so he can have the infection around his heart treated then go to cardiology to have a P.E.E completed by cardiology. Following the completion of this he will be able to have a visit with Dr. Cliffton Asters to start considering an additional surgery. She can be reached at [phone] for more information.

## 2022-02-18 ENCOUNTER — Other Ambulatory Visit: Payer: Self-pay

## 2022-02-18 ENCOUNTER — Telehealth: Payer: Self-pay | Admitting: Physician Assistant

## 2022-02-18 ENCOUNTER — Ambulatory Visit: Payer: BLUE CROSS/BLUE SHIELD | Admitting: Physician Assistant

## 2022-02-18 DIAGNOSIS — I079 Rheumatic tricuspid valve disease, unspecified: Secondary | ICD-10-CM

## 2022-02-18 DIAGNOSIS — I2690 Septic pulmonary embolism without acute cor pulmonale: Secondary | ICD-10-CM

## 2022-02-18 NOTE — Telephone Encounter (Signed)
Patient was sent to ED -   Patient Name: Reginald Clarke Gender: Male DOB: 11/19/1990 Age: 31 Y 29 D Return Phone Number: [phone] (Primary), [phone] (Secondary) Address: City/ State/ Zip: [place] [address] Client [facility] - Human resources officer [facility] Type Call Who Is Calling Patient / Member / Family / Caregiver Call Type Triage / Clinical Relationship To Patient Self Return Phone Number [phone] (Primary) Chief Complaint Fever (non-urgent symptom) (greater than THREE MONTHS old) Reason for Call Symptomatic / Request for Health Information Initial Comment Caller states she has a fever of 102. Translation No Nurse Assessment Nurse: Delford Field, RN, Ivin Booty Date/Time (Eastern Time): 02/16/2022 5:50:21 PM Confirm and document reason for call. If symptomatic, describe symptoms. ---Caller states that they have a temp of 102 earlier today. Does the patient have any new or worsening symptoms? ---Yes Will a triage be completed? ---Yes Related visit to physician within the last 2 weeks? ---Yes Does the PT have any chronic conditions? (i.e. diabetes, asthma, this includes High risk factors for pregnancy, etc.) ---Yes List chronic conditions. ---heart valve issue, colitis Is this a behavioral health or substance abuse call? ---No Guidelines Guideline Title Affirmed Question Affirmed Notes Nurse Date/Time (Eastern Time) Infection on Antibiotic Follow-up Call [1] Taking antibiotic AND [2] new-onset of fever Ria Bush 02/16/2022 5:52:40 PM Disp. Time Lamount Cohen Time) Disposition Final User 02/16/2022 5:56:06 PM Go to ED Now (or PCP triage) Yes Delford Field, RN, Ivin Booty Final Disposition 02/16/2022 5:56:06 PM Go to ED Now (or PCP triage) Yes Delford Field, RN, Carlyle Dolly NOTE: All timestamps contained within this report are represented as [place] Standard Time. CONFIDENTIALTY NOTICE: This fax  transmission is intended only for the addressee. It contains information that is legally privileged, confidential or otherwise protected from use or disclosure. If you are not the intended recipient, you are strictly prohibited from reviewing, disclosing, copying using or disseminating any of this information or taking any action in reliance on or regarding this information. If you have received this fax in error, please notify us immediately by telephone so that we can arrange for its return to [place]. Phone: [phone], Toll-Free: [phone], Fax: [phone] Page: 2 of 2 Call Id: [id number] Disposition Overriden: Call PCP within 24 Hours Override Reason: Patient's symptoms need a higher level of care Caller Disagree/Comply Comply Caller Understands Yes PreDisposition Did not know what to do Care Advice Given Per Guideline GO TO ED NOW (OR PCP TRIAGE): * IF PCP SECOND-LEVEL TRIAGE REQUIRED: You may need to be seen. Your doctor (or NP/PA) will want to talk with you to decide what's best. I'll page the provider on-call now. If you haven't heard from the provider (or me) within 30 minutes, go directly to the ED/UCC at _____________ Hospital. CARE ADVICE given per Infection on Antibiotics Follow-Up Call (Adult) guideline. Comments User: Cordella Register, RN Date/Time Lamount Cohen Time): 02/16/2022 5:56:04 PM pt currently on ABX to prevent infection; has endocarditis as well. RN upgraded to have the pt assessed. Referrals GO TO FACILITY UNDECIDED

## 2022-02-18 NOTE — Telephone Encounter (Signed)
Please see triage note

## 2022-02-18 NOTE — Telephone Encounter (Signed)
Noted, see lab results for discussion with patient.

## 2022-02-18 NOTE — Telephone Encounter (Signed)
Noted and agreed, thank you. 

## 2022-02-18 NOTE — Telephone Encounter (Signed)
Please see note regarding patient treatment needed

## 2022-02-19 ENCOUNTER — Ambulatory Visit: Payer: BLUE CROSS/BLUE SHIELD | Admitting: Cardiology

## 2022-02-21 NOTE — Progress Notes (Deleted)
Cardiology Office Note:    Date:  02/21/2022   ID:  Reginald Clarke, DOB 1990/11/21, MRN [id number]  PCP:  Allwardt, [name], PA-C   [facility] Providers Cardiologist:  None { Click to update primary MD,subspecialty MD or APP then REFRESH:1}    Referring MD: Allwardt, [name], PA-C   CC: *** Consulted for the evaluation of Tricuspid valve infective endocarditis at the behest of Ms. Allwardt  History of Present Illness:    Reginald Clarke is a 31 y.o. male with a hx of Infective endocarditis (TR) bacteremia s/p Angiovac 11/28/21, complicated by recent discitis and ostemyelitis with IV AB until 02/06/22.  Patient notes that he is doing ***.   Since last visit notes *** .  No chest pain or pressure ***.  No SOB/DOE*** and no PND/Orthopnea***.  No weight gain or leg swelling***.  No palpitations or syncope ***.  Ambulatory blood pressure ***.   Past Medical History:  Diagnosis Date   Anxiety    Endocarditis    Hepatitis    hx of hep C per pt    Past Surgical History:  Procedure Laterality Date   APPLICATION OF ANGIOVAC N/A 11/28/2021   Procedure: APPLICATION OF ANGIOVAC;  Surgeon: Corliss Skains, MD;  Location[facility];  Service: Vascular;  Laterality: N/A;   TEE WITHOUT CARDIOVERSION N/A 11/23/2021   Procedure: TRANSESOPHAGEAL ECHOCARDIOGRAM (TEE);  Surgeon: Christell Constant, MD;  Location: [facility] ENDOSCOPY;  Service: Cardiovascular;  Laterality: N/A;   TEE WITHOUT CARDIOVERSION N/A 11/28/2021   Procedure: TRANSESOPHAGEAL ECHOCARDIOGRAM (TEE);  Surgeon: Corliss Skains, MD;  Location: [facility] OR;  Service: Open Heart Surgery;  Laterality: N/A;   tubes in ears     WRIST FRACTURE SURGERY      Current Medications: No outpatient medications have been marked as taking for the 02/22/22 encounter (Appointment) with Christell Constant, MD.     Allergies:   Penicillins and Amoxil [amoxicillin trihydrate]   Social History   Socioeconomic History   Marital  status: Single    Spouse name: Not on file   Number of children: Not on file   Years of education: Not on file   Highest education level: Not on file  Occupational History   Not on file  Tobacco Use   Smoking status: Every Day    Types: Cigarettes   Smokeless tobacco: Never  Vaping Use   Vaping Use: Never used  Substance and Sexual Activity   Alcohol use: No   Drug use: No   Sexual activity: Yes    Birth control/protection: None  Other Topics Concern   Not on file  Social History Narrative   Not on file   Social Determinants of Health   Financial Resource Strain: Not on file  Food Insecurity: Not on file  Transportation Needs: Not on file  Physical Activity: Not on file  Stress: Not on file  Social Connections: Not on file     Family History: History of coronary artery disease notable for ***. History of heart failure notable for ***. History of arrhythmia notable for ***. Denies family history of sudden cardiac death including drowning, car accidents, or unexplained deaths in the family.*** No history of bicuspid aortic valve or aortic aneurysm or dissection.  *** No history of cardiomyopathies including hypertrophic cardiomyopathy, left ventricular non-compaction, or arrhythmogenic right ventricular cardiomyopathy.***   ROS:   Please see the history of present illness.    *** All other systems reviewed and are negative.  EKGs/Labs/Other  Studies Reviewed:    The following studies were reviewed today: ***  EKG:  EKG is *** ordered today.  The ekg ordered today demonstrates ***  Recent Labs: 12/26/2021: TSH 2.125 01/06/2022: Magnesium 1.8 02/14/2022: ALT 22; BUN 6; Creatinine, Ser 0.63; Hemoglobin 11.0; Platelets 171.0; Potassium 3.8; Sodium 134  Recent Lipid Panel No results found for: "CHOL", "TRIG", "HDL", "CHOLHDL", "VLDL", "LDLCALC", "LDLDIRECT"       Physical Exam:    VS:  There were no vitals taken for this visit.    Wt Readings from Last 3  Encounters:  02/14/22 173 lb 12.8 oz (78.8 kg)  12/26/21 168 lb (76.2 kg)  12/24/21 166 lb (75.3 kg)    Gen: *** distress, *** obese/well nourished/malnourished   Neck: No JVD, *** carotid bruit Ears: *** Frank Sign Cardiac: No Rubs or Gallops, *** Murmur, ***cardia, *** radial pulses Respiratory: Clear to auscultation bilaterally, *** effort, ***  respiratory rate GI: Soft, nontender, non-distended *** MS: No *** edema; *** moves all extremities Integument: Skin feels *** Neuro:  At time of evaluation, alert and oriented to person/place/time/situation *** Psych: Normal affect, patient feels ***   ASSESSMENT:    No diagnosis found. PLAN:    Infective Endocarditis - involving the *** valve - with *** for dental work related prophylaxis - Antibiotics completed *** - If concurrent substance abuse, see *** or behavioral health referral - Last imaging notable for ***  EKG Echo CCTA if positive        {Are you ordering a CV Procedure (e.g. stress test, cath, DCCV, TEE, etc)?   Press F2        :[id number]}    Medication Adjustments/Labs and Tests Ordered: Current medicines are reviewed at length with the patient today.  Concerns regarding medicines are outlined above.  No orders of the defined types were placed in this encounter.  No orders of the defined types were placed in this encounter.   There are no Patient Instructions on file for this visit.   Signed, Christell Constant, MD  02/21/2022 8:00 AM    [facility]

## 2022-02-22 ENCOUNTER — Ambulatory Visit: Payer: BLUE CROSS/BLUE SHIELD | Admitting: Interventional Cardiology

## 2022-02-22 ENCOUNTER — Encounter: Payer: Self-pay | Admitting: Interventional Cardiology

## 2022-02-22 ENCOUNTER — Ambulatory Visit: Payer: BLUE CROSS/BLUE SHIELD | Admitting: Internal Medicine

## 2022-02-22 VITALS — BP 102/60 | HR 94 | Ht 70.0 in | Wt 164.8 lb

## 2022-02-22 DIAGNOSIS — B182 Chronic viral hepatitis C: Secondary | ICD-10-CM

## 2022-02-22 DIAGNOSIS — I079 Rheumatic tricuspid valve disease, unspecified: Secondary | ICD-10-CM

## 2022-02-22 DIAGNOSIS — I2690 Septic pulmonary embolism without acute cor pulmonale: Secondary | ICD-10-CM

## 2022-02-22 DIAGNOSIS — R7881 Bacteremia: Secondary | ICD-10-CM | POA: Diagnosis not present

## 2022-02-22 DIAGNOSIS — F1910 Other psychoactive substance abuse, uncomplicated: Secondary | ICD-10-CM

## 2022-02-22 DIAGNOSIS — M4620 Osteomyelitis of vertebra, site unspecified: Secondary | ICD-10-CM

## 2022-02-22 NOTE — Patient Instructions (Addendum)
Medication Instructions:  Your physician recommends that you continue on your current medications as directed. Please refer to the Current Medication list given to you today.  *If you need a refill on your cardiac medications before your next appointment, please call your pharmacy*  Lab Work: NONE  Testing/Procedures: Your physician has requested that you have a TEE. During a TEE, sound waves are used to create images of your heart. It provides your doctor with information about the size and shape of your heart and how well your heart's chambers and valves are working. In this test, a transducer is attached to the end of a flexible tube that's guided down your throat and into your esophagus (the tube leading from you mouth to your stomach) to get a more detailed image of your heart. You are not awake for the procedure. Please see the instruction sheet given to you today. For further information please visit [email or web address]  Follow-Up: At [facility], you and your health needs are our priority.  As part of our continuing mission to provide you with exceptional heart care, we have created designated Provider Care Teams.  These Care Teams include your primary Cardiologist (physician) and Advanced Practice Providers (APPs -  Physician Assistants and Nurse Practitioners) who all work together to provide you with the care you need, when you need it.  Your next appointment:   4 month(s)  The format for your next appointment:   In Person  Provider:   Riley Lam, MD  Other Instructions You are scheduled for a TEE on Thursday March 07, 2022 with Dr. Riley Lam. Please arrive at the [facility] (Main Entrance A) at [facility]: [address] [place], [place] [address] at 10:00 am.  DIET: Nothing to eat or drink after midnight except a sip of water with medications (see medication instructions below)   FYI: For your safety, and to allow [place] to monitor your vital  signs accurately during the surgery/procedure we request that    if you have artificial nails, gel coating, SNS etc. Please have those removed prior to your surgery/procedure. Not having the nail coverings /polish removed may result in cancellation or delay of your surgery/procedure.   You must have a responsible person to drive you home and stay in the waiting area during your procedure. Failure to do so could result in cancellation.   Bring your insurance cards.   *Special Note: Every effort is made to have your procedure done on time. Occasionally there are emergencies that occur at the hospital that may cause delays. Please be patient if a delay does occur.       Important Information About Sugar

## 2022-02-22 NOTE — Progress Notes (Signed)
Cardiology Office Note:    Date:  02/22/2022   ID:  Reginald Saad, DOB [birth year], MRN [id number]  PCP:  Allwardt, [name], PA-C  Cardiologist:  None   Referring MD: Bary Leriche, PA-C   Chief Complaint  Patient presents with   Cardiac Valve Problem    Tricuspid endocarditis Angiovac debridement    History of Present Illness:    Reginald Clarke is a 31 y.o. male with a hx of tricuspid valve endocarditis with MSSA bacteremia and septic pulmonary emboli.  Underwent AngioVAC assisted debridement of right atrial mass and tricuspid valve vegetation but persistence of valvular component 11/28/2021 by Dr. Cliffton Asters.  Debrided material was culture positive for MSSA.    Other significant medical problems include IV drug abuse, hepatitis C, and anxiety disorder.  In reviewing chart it appears that the patient will be considered for surgical valve replacement but needs to have transesophageal echo procedure scheduled before he sees Dr. Cliffton Asters.  Imaging while in the hospital was done by Dr. Raynelle Jan.  Past Medical History:  Diagnosis Date   Anxiety    Endocarditis    Hepatitis    hx of hep C per pt    Past Surgical History:  Procedure Laterality Date   APPLICATION OF ANGIOVAC N/A 11/28/2021   Procedure: APPLICATION OF ANGIOVAC;  Surgeon: Corliss Skains, MD;  Location: [facility] OR;  Service: Vascular;  Laterality: N/A;   TEE WITHOUT CARDIOVERSION N/A 11/23/2021   Procedure: TRANSESOPHAGEAL ECHOCARDIOGRAM (TEE);  Surgeon: Christell Constant, MD;  Location: [facility] ENDOSCOPY;  Service: Cardiovascular;  Laterality: N/A;   TEE WITHOUT CARDIOVERSION N/A 11/28/2021   Procedure: TRANSESOPHAGEAL ECHOCARDIOGRAM (TEE);  Surgeon: Corliss Skains, MD;  Location: [facility] OR;  Service: Open Heart Surgery;  Laterality: N/A;   tubes in ears     WRIST FRACTURE SURGERY      Current Medications: Current Meds  Medication Sig   ALPRAZolam (XANAX) 0.25 MG tablet Take 1 tablet (0.25 mg total) by  mouth 2 (two) times daily as needed for anxiety.   cefdinir (OMNICEF) 300 MG capsule Take 300 mg by mouth 2 (two) times daily.   ibuprofen (ADVIL) 600 MG tablet Take 1 tablet (600 mg total) by mouth every 8 (eight) hours as needed for moderate pain.   methadone (DOLOPHINE) 1 MG/1ML solution Take 135 mg by mouth daily.   methocarbamol (ROBAXIN) 500 MG tablet Take 1 tablet (500 mg total) by mouth every 8 (eight) hours as needed for muscle spasms.   ondansetron (ZOFRAN) 4 MG tablet Take 4 mg by mouth every 8 (eight) hours as needed.   pantoprazole (PROTONIX) 40 MG tablet Take 1 tablet (40 mg total) by mouth daily.   Pseudoephedrine-guaiFENesin (GUAIFENESIN 600/PSE 120 PO) Take 600 mg by mouth 2 (two) times daily.     Allergies:   Penicillins and Amoxil [amoxicillin trihydrate]   Social History   Socioeconomic History   Marital status: Single    Spouse name: Not on file   Number of children: Not on file   Years of education: Not on file   Highest education level: Not on file  Occupational History   Not on file  Tobacco Use   Smoking status: Every Day    Types: Cigarettes   Smokeless tobacco: Never  Vaping Use   Vaping Use: Never used  Substance and Sexual Activity   Alcohol use: No   Drug use: No   Sexual activity: Yes    Birth control/protection: None  Other Topics Concern  Not on file  Social History Narrative   Not on file   Social Determinants of Health   Financial Resource Strain: Not on file  Food Insecurity: Not on file  Transportation Needs: Not on file  Physical Activity: Not on file  Stress: Not on file  Social Connections: Not on file     Family History: The patient's family history is not on file.  ROS:   Please see the history of present illness.    Is now off antibiotics.  Has had some occasional low-grade fevers.  He is in contact with infectious disease about this.  He is established with a primary physician.  He has not yet seen Dr. Cliffton Asters.  As I  understand it he needs to have the TEE done before he can be seen.  All other systems reviewed and are negative.  EKGs/Labs/Other Studies Reviewed:    The following studies were reviewed today: Reports of transesophageal echo, angio Vac procedure for tricuspid valve and right atrial vegetation debridement, and transthoracic echo.  EKG:  EKG normal sinus rhythm without any significant abnormality noted on tracing performed 01/10/2022.  Heart rate 77 bpm.  Recent Labs: 12/26/2021: TSH 2.125 01/06/2022: Magnesium 1.8 02/14/2022: ALT 22; BUN 6; Creatinine, Ser 0.63; Hemoglobin 11.0; Platelets 171.0; Potassium 3.8; Sodium 134  Recent Lipid Panel No results found for: "CHOL", "TRIG", "HDL", "CHOLHDL", "VLDL", "LDLCALC", "LDLDIRECT"  Physical Exam:    VS:  BP 102/60   Pulse 94   Ht 5\' 10"  (1.778 m)   Wt 164 lb 12.8 oz (74.8 kg)   SpO2 95%   BMI 23.65 kg/m     Wt Readings from Last 3 Encounters:  02/22/22 164 lb 12.8 oz (74.8 kg)  02/14/22 173 lb 12.8 oz (78.8 kg)  12/26/21 168 lb (76.2 kg)     GEN: Slender, pale, and chronically ill-appearing. No acute distress HEENT: Normal NECK: No JVD. LYMPHATICS: No lymphadenopathy CARDIAC: Very low pitched left lower and mid sternal systolic murmur that increases in intensity with deep inspiration (Carvalho sign).  RRR no gallop, or edema. VASCULAR:  Normal Pulses. No bruits. RESPIRATORY:  Clear to auscultation without rales, wheezing or rhonchi  ABDOMEN: Soft, non-tender, non-distended, No pulsatile mass, MUSCULOSKELETAL: No deformity  SKIN: Warm and dry NEUROLOGIC:  Alert and oriented x 3 PSYCHIATRIC:  Normal affect   ASSESSMENT:    1. Endocarditis of tricuspid valve   2. Septic pulmonary embolism without acute cor pulmonale, unspecified chronicity ([name])   3. Bacteremia with gram positive cocci    4. Polysubstance abuse ([name])   5. Chronic hepatitis C without hepatic coma ([name])   6. Vertebral osteomyelitis ([name])    PLAN:    In order  of problems listed above:  Transesophageal echo Dr. 12/28/21.  Patient is status post angio vac debridement of tricuspid vegetation.  He has completed antibiotic therapy.  He is in queue to see Dr. Raynelle Jan for consideration of tricuspid valve replacement.  Needs transesophageal echo prior to seeing Dr. Cliffton Asters.  TEE procedure and risks were discussed with the patient.  The patient is encouraged to make an appointment to follow-up with infectious disease as well.   Medication Adjustments/Labs and Tests Ordered: Current medicines are reviewed at length with the patient today.  Concerns regarding medicines are outlined above.  No orders of the defined types were placed in this encounter.  No orders of the defined types were placed in this encounter.   There are no Patient Instructions on file for this visit.  Signed, Lesleigh Noe, MD  02/22/2022 1:36 PM    [facility]

## 2022-02-24 ENCOUNTER — Encounter (HOSPITAL_COMMUNITY): Payer: Self-pay

## 2022-02-24 ENCOUNTER — Other Ambulatory Visit: Payer: Self-pay

## 2022-02-24 ENCOUNTER — Emergency Department (HOSPITAL_COMMUNITY)
Admission: EM | Admit: 2022-02-24 | Discharge: 2022-02-24 | Discharge status: Against Medical Advice | Payer: BLUE CROSS/BLUE SHIELD | Source: Home / Self Care

## 2022-02-24 ENCOUNTER — Emergency Department (HOSPITAL_COMMUNITY): Payer: BLUE CROSS/BLUE SHIELD

## 2022-02-24 DIAGNOSIS — R7881 Bacteremia: Secondary | ICD-10-CM | POA: Diagnosis not present

## 2022-02-24 DIAGNOSIS — R112 Nausea with vomiting, unspecified: Secondary | ICD-10-CM | POA: Insufficient documentation

## 2022-02-24 DIAGNOSIS — D72829 Elevated white blood cell count, unspecified: Secondary | ICD-10-CM | POA: Insufficient documentation

## 2022-02-24 DIAGNOSIS — R509 Fever, unspecified: Secondary | ICD-10-CM | POA: Insufficient documentation

## 2022-02-24 DIAGNOSIS — Z5321 Procedure and treatment not carried out due to patient leaving prior to being seen by health care provider: Secondary | ICD-10-CM | POA: Insufficient documentation

## 2022-02-24 DIAGNOSIS — I330 Acute and subacute infective endocarditis: Secondary | ICD-10-CM | POA: Diagnosis not present

## 2022-02-24 LAB — CBC WITH DIFFERENTIAL/PLATELET
Abs Immature Granulocytes: 0.13 10*3/uL — ABNORMAL HIGH (ref 0.00–0.07)
Basophils Absolute: 0.1 10*3/uL (ref 0.0–0.1)
Basophils Relative: 0 %
Eosinophils Absolute: 0 10*3/uL (ref 0.0–0.5)
Eosinophils Relative: 0 %
HCT: 37.8 % — ABNORMAL LOW (ref 39.0–52.0)
Hemoglobin: 12.2 g/dL — ABNORMAL LOW (ref 13.0–17.0)
Immature Granulocytes: 1 %
Lymphocytes Relative: 8 %
Lymphs Abs: 1.2 10*3/uL (ref 0.7–4.0)
MCH: 27 pg (ref 26.0–34.0)
MCHC: 32.3 g/dL (ref 30.0–36.0)
MCV: 83.6 fL (ref 80.0–100.0)
Monocytes Absolute: 1.6 10*3/uL — ABNORMAL HIGH (ref 0.1–1.0)
Monocytes Relative: 11 %
Neutro Abs: 11.7 10*3/uL — ABNORMAL HIGH (ref 1.7–7.7)
Neutrophils Relative %: 80 %
Platelets: 123 10*3/uL — ABNORMAL LOW (ref 150–400)
RBC: 4.52 MIL/uL (ref 4.22–5.81)
RDW: 15 % (ref 11.5–15.5)
WBC: 14.8 10*3/uL — ABNORMAL HIGH (ref 4.0–10.5)
nRBC: 0 % (ref 0.0–0.2)

## 2022-02-24 LAB — COMPREHENSIVE METABOLIC PANEL
ALT: 14 U/L (ref 0–44)
AST: 22 U/L (ref 15–41)
Albumin: 3 g/dL — ABNORMAL LOW (ref 3.5–5.0)
Alkaline Phosphatase: 55 U/L (ref 38–126)
Anion gap: 14 (ref 5–15)
BUN: 12 mg/dL (ref 6–20)
CO2: 28 mmol/L (ref 22–32)
Calcium: 8.6 mg/dL — ABNORMAL LOW (ref 8.9–10.3)
Chloride: 91 mmol/L — ABNORMAL LOW (ref 98–111)
Creatinine, Ser: 1.06 mg/dL (ref 0.61–1.24)
GFR, Estimated: >60 mL/min (ref >60)
Glucose, Bld: 136 mg/dL — ABNORMAL HIGH (ref 70–99)
Potassium: 3.1 mmol/L — ABNORMAL LOW (ref 3.5–5.1)
Sodium: 133 mmol/L — ABNORMAL LOW (ref 135–145)
Total Bilirubin: 0.8 mg/dL (ref 0.3–1.2)
Total Protein: 7.7 g/dL (ref 6.5–8.1)

## 2022-02-24 LAB — PROTIME-INR
INR: 1.4 — ABNORMAL HIGH (ref 0.8–1.2)
Prothrombin Time: 16.7 seconds — ABNORMAL HIGH (ref 11.4–15.2)

## 2022-02-24 LAB — LACTIC ACID, PLASMA: Lactic Acid, Venous: 2.7 mmol/L (ref 0.5–1.9)

## 2022-02-24 NOTE — ED Triage Notes (Signed)
Patient has endocarditis in the hospital for 12 weeks just discharged on 7/5 from [facility].  Last week was called with high WBC and + blood cultures and has an appt with ID MD and for  TEE on 8/3  but last 4 days has not been able to eat anything and now can't keep water down.  Reports have fevers on and off.l

## 2022-02-24 NOTE — ED Provider Triage Note (Signed)
Emergency Medicine Provider Triage Evaluation Note  Reginald Clarke , a 31 y.o. male  was evaluated in triage.  Pt complains of emesis onset 4 days. Has a history of IV drug use and notes that his last use was approximately 3 years ago.  Was recently in the hospital for endocarditis for approximately 12 weeks and was recently discharged.  Was seen with his primary care provider who noted that he had positive blood cultures and elevated WBC.  Has a follow-up appointment with the infectious disease specialist on 02/27/2022.  Notes that within the past 4 days he has been unable to keep any food or water down due to nausea and vomiting.  Denies chest pain, shortness of breath.   Review of Systems  Positive: As per HPI Negative:   Physical Exam  BP 96/72 (BP Location: Right Arm)   Pulse 96   Temp (!) 97.4 F (36.3 C) (Oral)   Resp 20   Ht 5\' 10"  (1.778 m)   Wt 74.4 kg   SpO2 100%   BMI 23.53 kg/m  Gen:   Awake, no distress   Resp:  Normal effort  MSK:   Moves extremities without difficulty  Other:  No abdominal tenderness to palpation.  No chest wall tenderness to palpation.  Medical Decision Making  Medically screening exam initiated at 6:21 PM.  Appropriate orders placed.  Nocera was informed that the remainder of the evaluation will be completed by another provider, this initial triage assessment does not replace that evaluation, and the importance of remaining in the ED until their evaluation is complete.  Work-up initiated.    Dionne Rossa A, PA-C 02/24/22 1827

## 2022-02-24 NOTE — ED Notes (Signed)
Pt states that he is leaving and will come back in the morning.  

## 2022-02-25 ENCOUNTER — Inpatient Hospital Stay (HOSPITAL_COMMUNITY)
Admission: EM | Admit: 2022-02-25 | Discharge: 2022-06-04 | DRG: 219 | Disposition: A | Discharge status: Home / Self Care | Payer: BLUE CROSS/BLUE SHIELD | Attending: Internal Medicine | Admitting: Internal Medicine

## 2022-02-25 ENCOUNTER — Encounter (HOSPITAL_COMMUNITY): Payer: Self-pay | Admitting: Emergency Medicine

## 2022-02-25 ENCOUNTER — Emergency Department (HOSPITAL_COMMUNITY): Payer: BLUE CROSS/BLUE SHIELD

## 2022-02-25 DIAGNOSIS — Y828 Other medical devices associated with adverse incidents: Secondary | ICD-10-CM | POA: Diagnosis present

## 2022-02-25 DIAGNOSIS — K08109 Complete loss of teeth, unspecified cause, unspecified class: Secondary | ICD-10-CM

## 2022-02-25 DIAGNOSIS — F17210 Nicotine dependence, cigarettes, uncomplicated: Secondary | ICD-10-CM | POA: Diagnosis present

## 2022-02-25 DIAGNOSIS — F1910 Other psychoactive substance abuse, uncomplicated: Secondary | ICD-10-CM | POA: Diagnosis present

## 2022-02-25 DIAGNOSIS — F32A Depression, unspecified: Secondary | ICD-10-CM | POA: Diagnosis present

## 2022-02-25 DIAGNOSIS — I072 Rheumatic tricuspid stenosis and insufficiency: Secondary | ICD-10-CM | POA: Diagnosis present

## 2022-02-25 DIAGNOSIS — G894 Chronic pain syndrome: Secondary | ICD-10-CM | POA: Diagnosis not present

## 2022-02-25 DIAGNOSIS — Z87891 Personal history of nicotine dependence: Secondary | ICD-10-CM | POA: Diagnosis present

## 2022-02-25 DIAGNOSIS — E860 Dehydration: Principal | ICD-10-CM

## 2022-02-25 DIAGNOSIS — E440 Moderate protein-calorie malnutrition: Secondary | ICD-10-CM | POA: Diagnosis present

## 2022-02-25 DIAGNOSIS — F419 Anxiety disorder, unspecified: Secondary | ICD-10-CM | POA: Diagnosis present

## 2022-02-25 DIAGNOSIS — R7881 Bacteremia: Secondary | ICD-10-CM

## 2022-02-25 DIAGNOSIS — J9600 Acute respiratory failure, unspecified whether with hypoxia or hypercapnia: Secondary | ICD-10-CM | POA: Diagnosis not present

## 2022-02-25 DIAGNOSIS — B182 Chronic viral hepatitis C: Secondary | ICD-10-CM | POA: Diagnosis not present

## 2022-02-25 DIAGNOSIS — Z720 Tobacco use: Secondary | ICD-10-CM | POA: Diagnosis present

## 2022-02-25 DIAGNOSIS — D696 Thrombocytopenia, unspecified: Secondary | ICD-10-CM | POA: Diagnosis present

## 2022-02-25 DIAGNOSIS — K219 Gastro-esophageal reflux disease without esophagitis: Secondary | ICD-10-CM | POA: Diagnosis present

## 2022-02-25 DIAGNOSIS — T801XXA Vascular complications following infusion, transfusion and therapeutic injection, initial encounter: Secondary | ICD-10-CM | POA: Diagnosis not present

## 2022-02-25 DIAGNOSIS — T80211A Bloodstream infection due to central venous catheter, initial encounter: Secondary | ICD-10-CM | POA: Diagnosis present

## 2022-02-25 DIAGNOSIS — Z01818 Encounter for other preprocedural examination: Secondary | ICD-10-CM

## 2022-02-25 DIAGNOSIS — Z20822 Contact with and (suspected) exposure to covid-19: Secondary | ICD-10-CM | POA: Diagnosis present

## 2022-02-25 DIAGNOSIS — R7989 Other specified abnormal findings of blood chemistry: Secondary | ICD-10-CM | POA: Diagnosis not present

## 2022-02-25 DIAGNOSIS — K036 Deposits [accretions] on teeth: Secondary | ICD-10-CM

## 2022-02-25 DIAGNOSIS — Z515 Encounter for palliative care: Secondary | ICD-10-CM

## 2022-02-25 DIAGNOSIS — K083 Retained dental root: Secondary | ICD-10-CM | POA: Diagnosis present

## 2022-02-25 DIAGNOSIS — Z86711 Personal history of pulmonary embolism: Secondary | ICD-10-CM

## 2022-02-25 DIAGNOSIS — M546 Pain in thoracic spine: Secondary | ICD-10-CM | POA: Diagnosis present

## 2022-02-25 DIAGNOSIS — I079 Rheumatic tricuspid valve disease, unspecified: Secondary | ICD-10-CM | POA: Diagnosis present

## 2022-02-25 DIAGNOSIS — D649 Anemia, unspecified: Secondary | ICD-10-CM | POA: Diagnosis present

## 2022-02-25 DIAGNOSIS — A4101 Sepsis due to Methicillin susceptible Staphylococcus aureus: Secondary | ICD-10-CM | POA: Diagnosis not present

## 2022-02-25 DIAGNOSIS — D638 Anemia in other chronic diseases classified elsewhere: Secondary | ICD-10-CM | POA: Diagnosis present

## 2022-02-25 DIAGNOSIS — B9561 Methicillin susceptible Staphylococcus aureus infection as the cause of diseases classified elsewhere: Secondary | ICD-10-CM | POA: Diagnosis not present

## 2022-02-25 DIAGNOSIS — J90 Pleural effusion, not elsewhere classified: Secondary | ICD-10-CM

## 2022-02-25 DIAGNOSIS — K089 Disorder of teeth and supporting structures, unspecified: Secondary | ICD-10-CM | POA: Diagnosis not present

## 2022-02-25 DIAGNOSIS — K000 Anodontia: Secondary | ICD-10-CM | POA: Diagnosis present

## 2022-02-25 DIAGNOSIS — E871 Hypo-osmolality and hyponatremia: Secondary | ICD-10-CM | POA: Diagnosis present

## 2022-02-25 DIAGNOSIS — F1990 Other psychoactive substance use, unspecified, uncomplicated: Secondary | ICD-10-CM | POA: Diagnosis not present

## 2022-02-25 DIAGNOSIS — J9601 Acute respiratory failure with hypoxia: Secondary | ICD-10-CM | POA: Diagnosis not present

## 2022-02-25 DIAGNOSIS — F40232 Fear of other medical care: Secondary | ICD-10-CM

## 2022-02-25 DIAGNOSIS — Z7189 Other specified counseling: Secondary | ICD-10-CM | POA: Diagnosis not present

## 2022-02-25 DIAGNOSIS — I2782 Chronic pulmonary embolism: Secondary | ICD-10-CM | POA: Diagnosis not present

## 2022-02-25 DIAGNOSIS — R6521 Severe sepsis with septic shock: Secondary | ICD-10-CM | POA: Diagnosis not present

## 2022-02-25 DIAGNOSIS — K045 Chronic apical periodontitis: Secondary | ICD-10-CM | POA: Diagnosis present

## 2022-02-25 DIAGNOSIS — F411 Generalized anxiety disorder: Secondary | ICD-10-CM | POA: Diagnosis present

## 2022-02-25 DIAGNOSIS — I38 Endocarditis, valve unspecified: Secondary | ICD-10-CM | POA: Diagnosis not present

## 2022-02-25 DIAGNOSIS — R9431 Abnormal electrocardiogram [ECG] [EKG]: Secondary | ICD-10-CM | POA: Diagnosis present

## 2022-02-25 DIAGNOSIS — A4102 Sepsis due to Methicillin resistant Staphylococcus aureus: Secondary | ICD-10-CM | POA: Diagnosis not present

## 2022-02-25 DIAGNOSIS — J15212 Pneumonia due to Methicillin resistant Staphylococcus aureus: Secondary | ICD-10-CM

## 2022-02-25 DIAGNOSIS — Z952 Presence of prosthetic heart valve: Secondary | ICD-10-CM | POA: Diagnosis present

## 2022-02-25 DIAGNOSIS — Z8679 Personal history of other diseases of the circulatory system: Secondary | ICD-10-CM

## 2022-02-25 DIAGNOSIS — J9811 Atelectasis: Secondary | ICD-10-CM | POA: Diagnosis not present

## 2022-02-25 DIAGNOSIS — Z79899 Other long term (current) drug therapy: Secondary | ICD-10-CM

## 2022-02-25 DIAGNOSIS — I361 Nonrheumatic tricuspid (valve) insufficiency: Secondary | ICD-10-CM | POA: Diagnosis not present

## 2022-02-25 DIAGNOSIS — K011 Impacted teeth: Secondary | ICD-10-CM | POA: Diagnosis present

## 2022-02-25 DIAGNOSIS — F1120 Opioid dependence, uncomplicated: Secondary | ICD-10-CM | POA: Diagnosis present

## 2022-02-25 DIAGNOSIS — K0530 Chronic periodontitis, unspecified: Secondary | ICD-10-CM

## 2022-02-25 DIAGNOSIS — A419 Sepsis, unspecified organism: Secondary | ICD-10-CM

## 2022-02-25 DIAGNOSIS — Z880 Allergy status to penicillin: Secondary | ICD-10-CM

## 2022-02-25 DIAGNOSIS — I330 Acute and subacute infective endocarditis: Secondary | ICD-10-CM | POA: Diagnosis not present

## 2022-02-25 DIAGNOSIS — J189 Pneumonia, unspecified organism: Secondary | ICD-10-CM | POA: Diagnosis not present

## 2022-02-25 DIAGNOSIS — E876 Hypokalemia: Secondary | ICD-10-CM | POA: Diagnosis present

## 2022-02-25 DIAGNOSIS — B9562 Methicillin resistant Staphylococcus aureus infection as the cause of diseases classified elsewhere: Secondary | ICD-10-CM | POA: Diagnosis not present

## 2022-02-25 DIAGNOSIS — R579 Shock, unspecified: Secondary | ICD-10-CM

## 2022-02-25 DIAGNOSIS — G479 Sleep disorder, unspecified: Secondary | ICD-10-CM | POA: Diagnosis present

## 2022-02-25 DIAGNOSIS — F1591 Other stimulant use, unspecified, in remission: Secondary | ICD-10-CM | POA: Diagnosis present

## 2022-02-25 DIAGNOSIS — R042 Hemoptysis: Secondary | ICD-10-CM | POA: Diagnosis present

## 2022-02-25 DIAGNOSIS — Z6823 Body mass index (BMI) 23.0-23.9, adult: Secondary | ICD-10-CM

## 2022-02-25 DIAGNOSIS — D62 Acute posthemorrhagic anemia: Secondary | ICD-10-CM | POA: Diagnosis not present

## 2022-02-25 DIAGNOSIS — I2690 Septic pulmonary embolism without acute cor pulmonale: Secondary | ICD-10-CM | POA: Diagnosis not present

## 2022-02-25 DIAGNOSIS — Z5181 Encounter for therapeutic drug level monitoring: Secondary | ICD-10-CM

## 2022-02-25 DIAGNOSIS — I019 Acute rheumatic heart disease, unspecified: Secondary | ICD-10-CM | POA: Diagnosis not present

## 2022-02-25 DIAGNOSIS — K029 Dental caries, unspecified: Secondary | ICD-10-CM | POA: Diagnosis present

## 2022-02-25 DIAGNOSIS — Z9889 Other specified postprocedural states: Secondary | ICD-10-CM

## 2022-02-25 HISTORY — DX: Opioid dependence, uncomplicated: F11.20

## 2022-02-25 LAB — URINALYSIS, ROUTINE W REFLEX MICROSCOPIC
Bilirubin Urine: NEGATIVE
Glucose, UA: NEGATIVE mg/dL
Ketones, ur: NEGATIVE mg/dL
Nitrite: NEGATIVE
Protein, ur: 30 mg/dL — AB
Specific Gravity, Urine: 1.015 (ref 1.005–1.030)
pH: 6 (ref 5.0–8.0)

## 2022-02-25 LAB — BLOOD CULTURE ID PANEL (REFLEXED) - BCID2

## 2022-02-25 LAB — COMPREHENSIVE METABOLIC PANEL
ALT: 12 U/L (ref 0–44)
AST: 24 U/L (ref 15–41)
Albumin: 2.8 g/dL — ABNORMAL LOW (ref 3.5–5.0)
Alkaline Phosphatase: 56 U/L (ref 38–126)
Anion gap: 12 (ref 5–15)
BUN: 15 mg/dL (ref 6–20)
CO2: 28 mmol/L (ref 22–32)
Calcium: 8.4 mg/dL — ABNORMAL LOW (ref 8.9–10.3)
Chloride: 87 mmol/L — ABNORMAL LOW (ref 98–111)
Creatinine, Ser: 1.19 mg/dL (ref 0.61–1.24)
GFR, Estimated: >60 mL/min (ref >60)
Glucose, Bld: 104 mg/dL — ABNORMAL HIGH (ref 70–99)
Potassium: 2.6 mmol/L — CL (ref 3.5–5.1)
Sodium: 127 mmol/L — ABNORMAL LOW (ref 135–145)
Total Bilirubin: 1.1 mg/dL (ref 0.3–1.2)
Total Protein: 7.3 g/dL (ref 6.5–8.1)

## 2022-02-25 LAB — PROTIME-INR
INR: 1.3 — ABNORMAL HIGH (ref 0.8–1.2)
Prothrombin Time: 15.9 seconds — ABNORMAL HIGH (ref 11.4–15.2)

## 2022-02-25 LAB — CBC WITH DIFFERENTIAL/PLATELET
Abs Immature Granulocytes: 0.08 10*3/uL — ABNORMAL HIGH (ref 0.00–0.07)
Basophils Absolute: 0 10*3/uL (ref 0.0–0.1)
Basophils Relative: 0 %
Eosinophils Absolute: 0 10*3/uL (ref 0.0–0.5)
Eosinophils Relative: 0 %
HCT: 33.4 % — ABNORMAL LOW (ref 39.0–52.0)
Hemoglobin: 11 g/dL — ABNORMAL LOW (ref 13.0–17.0)
Immature Granulocytes: 1 %
Lymphocytes Relative: 7 %
Lymphs Abs: 1 10*3/uL (ref 0.7–4.0)
MCH: 27.1 pg (ref 26.0–34.0)
MCHC: 32.9 g/dL (ref 30.0–36.0)
MCV: 82.3 fL (ref 80.0–100.0)
Monocytes Absolute: 1.3 10*3/uL — ABNORMAL HIGH (ref 0.1–1.0)
Monocytes Relative: 10 %
Neutro Abs: 11.6 10*3/uL — ABNORMAL HIGH (ref 1.7–7.7)
Neutrophils Relative %: 82 %
Platelets: 117 10*3/uL — ABNORMAL LOW (ref 150–400)
RBC: 4.06 MIL/uL — ABNORMAL LOW (ref 4.22–5.81)
RDW: 15.2 % (ref 11.5–15.5)
WBC: 14 10*3/uL — ABNORMAL HIGH (ref 4.0–10.5)
nRBC: 0 % (ref 0.0–0.2)

## 2022-02-25 LAB — LIPASE, BLOOD: Lipase: 21 U/L (ref 11–51)

## 2022-02-25 LAB — LACTIC ACID, PLASMA
Lactic Acid, Venous: 2.1 mmol/L (ref 0.5–1.9)
Lactic Acid, Venous: 1.8 mmol/L (ref 0.5–1.9)

## 2022-02-25 LAB — MAGNESIUM: Magnesium: 1.3 mg/dL — ABNORMAL LOW (ref 1.7–2.4)

## 2022-02-25 MED ORDER — TRIMETHOBENZAMIDE HCL 100 MG/ML IM SOLN
200.0000 mg | Freq: Once | INTRAMUSCULAR | Status: AC
Start: 2022-02-25 — End: 2022-02-25
  Administered 2022-02-25: 200 mg via INTRAMUSCULAR
  Filled 2022-02-25: qty 2

## 2022-02-25 MED ORDER — POTASSIUM CHLORIDE CRYS ER 20 MEQ PO TBCR
40.0000 meq | EXTENDED_RELEASE_TABLET | Freq: Once | ORAL | Status: AC
  Administered 2022-02-25: 40 meq via ORAL
  Filled 2022-02-25: qty 2

## 2022-02-25 MED ORDER — IBUPROFEN 600 MG PO TABS
600.0000 mg | ORAL_TABLET | Freq: Three times a day (TID) | ORAL | Status: DC | PRN
  Administered 2022-02-27: 600 mg via ORAL
  Filled 2022-02-25: qty 1

## 2022-02-25 MED ORDER — METHOCARBAMOL 500 MG PO TABS
500.0000 mg | ORAL_TABLET | Freq: Three times a day (TID) | ORAL | Status: DC | PRN
  Administered 2022-02-25 – 2022-04-29 (×23): 500 mg via ORAL
  Filled 2022-02-25 (×24): qty 1

## 2022-02-25 MED ORDER — LACTATED RINGERS IV SOLN
INTRAVENOUS | Status: AC
Start: 2022-02-25 — End: 2022-02-26

## 2022-02-25 MED ORDER — BISACODYL 5 MG PO TBEC
5.0000 mg | DELAYED_RELEASE_TABLET | Freq: Every day | ORAL | Status: DC | PRN
  Filled 2022-02-25: qty 1

## 2022-02-25 MED ORDER — HYDRALAZINE HCL 20 MG/ML IJ SOLN: 5.0000 mg | INTRAMUSCULAR | Status: DC | PRN

## 2022-02-25 MED ORDER — METHADONE HCL 10 MG PO TABS
135.0000 mg | ORAL_TABLET | Freq: Every day | ORAL | Status: DC
  Administered 2022-02-26 – 2022-04-22 (×56): 135 mg via ORAL
  Filled 2022-02-25 (×58): qty 14

## 2022-02-25 MED ORDER — ACETAMINOPHEN 325 MG PO TABS
650.0000 mg | ORAL_TABLET | Freq: Three times a day (TID) | ORAL | Status: DC | PRN
  Administered 2022-02-25 – 2022-04-13 (×13): 650 mg via ORAL
  Filled 2022-02-25 (×13): qty 2

## 2022-02-25 MED ORDER — CEFAZOLIN SODIUM-DEXTROSE 2-4 GM/100ML-% IV SOLN
2.0000 g | Freq: Three times a day (TID) | INTRAVENOUS | Status: DC
  Administered 2022-02-25 – 2022-03-09 (×35): 2 g via INTRAVENOUS
  Filled 2022-02-25 (×36): qty 100

## 2022-02-25 MED ORDER — DOCUSATE SODIUM 100 MG PO CAPS
100.0000 mg | ORAL_CAPSULE | Freq: Two times a day (BID) | ORAL | Status: DC
  Administered 2022-02-28 – 2022-03-06 (×7): 100 mg via ORAL
  Filled 2022-02-25 (×15): qty 1

## 2022-02-25 MED ORDER — ENOXAPARIN SODIUM 40 MG/0.4ML IJ SOSY
40.0000 mg | PREFILLED_SYRINGE | INTRAMUSCULAR | Status: DC
  Administered 2022-02-25 – 2022-03-30 (×33): 40 mg via SUBCUTANEOUS
  Filled 2022-02-25 (×40): qty 0.4

## 2022-02-25 MED ORDER — LACTATED RINGERS IV BOLUS
1000.0000 mL | Freq: Once | INTRAVENOUS | Status: AC
  Administered 2022-02-25: 1000 mL via INTRAVENOUS

## 2022-02-25 MED ORDER — NICOTINE 14 MG/24HR TD PT24
14.0000 mg | MEDICATED_PATCH | Freq: Every day | TRANSDERMAL | Status: DC
  Filled 2022-02-25 (×21): qty 1

## 2022-02-25 MED ORDER — POLYETHYLENE GLYCOL 3350 17 G PO PACK: 17.0000 g | PACK | Freq: Every day | ORAL | Status: DC | PRN

## 2022-02-25 MED ORDER — SODIUM CHLORIDE 0.9% FLUSH
3.0000 mL | Freq: Two times a day (BID) | INTRAVENOUS | Status: DC
  Administered 2022-02-25 – 2022-03-22 (×39): 3 mL via INTRAVENOUS

## 2022-02-25 MED ORDER — ALPRAZOLAM 0.25 MG PO TABS
0.2500 mg | ORAL_TABLET | Freq: Two times a day (BID) | ORAL | Status: DC | PRN
  Administered 2022-02-26 – 2022-02-28 (×4): 0.25 mg via ORAL
  Filled 2022-02-25 (×4): qty 1

## 2022-02-25 MED ORDER — PANTOPRAZOLE SODIUM 40 MG PO TBEC
40.0000 mg | DELAYED_RELEASE_TABLET | Freq: Every day | ORAL | Status: DC
Start: 2022-02-25 — End: 2022-02-27
  Administered 2022-02-25 – 2022-02-27 (×3): 40 mg via ORAL
  Filled 2022-02-25 (×3): qty 1

## 2022-02-25 MED ORDER — POTASSIUM CHLORIDE 10 MEQ/100ML IV SOLN
10.0000 meq | Freq: Once | INTRAVENOUS | Status: AC
  Administered 2022-02-25: 10 meq via INTRAVENOUS
  Filled 2022-02-25: qty 100

## 2022-02-25 NOTE — ED Notes (Signed)
Rec'd call from [facility] w/ positive blood cultures. Per review of chart, hx of positive blood cultures. Will call pt and notify him and request return to dept.

## 2022-02-25 NOTE — H&P (Addendum)
History and Physical    Patient: Reginald Clarke DOB: 01-06-1991 DOA: 02/25/2022 DOS: the patient was seen and examined on 02/25/2022 PCP: Allwardt, Randa Evens, PA-C  Patient coming from: Home - lives with grandparents, father, and sometimes his girlfriend; NOKDarl Householder, [phone]   Chief Complaint: n/v/d  HPI: Reginald Clarke is a 31 y.o. male with medical history significant of IVDU -> persistent MSSA bacteremia with native tricuspid valve endocarditis, septic emboli to lungs, and T10-11 osteomyelitis; she was hospitalized for this in April-May and had angio vac procedure -> weekly dalbavancin and then was readmitted from 5/24-6/8 for the same, not a candidate for repeat treatment.  He was continued on IV Ancef and discharged to [facility] for completion of IV Ancef through 7/5 with plan to transition to Cefdinir through 7/19.  He was feeling better when he left [facility] but still had some symptoms.  Shortly thereafter he started noticing subjective fevers.  When he finished antibiotics, things started up again.  He girlfriend noticed even before that decreased energy, strength, motivation.  His hands and feet and lips have been blue.  Alternating hot/cold.  He woke up in a puddle of sweat 2 nights ago.  Some days he feels fine but other days he can't do anything.  He has eaten very little the last few days, having n/v/d.  GI symptoms are new.  By the time his back is hurting, that is when it is bad - he started again with back pain about 4 days ago.  He is on methadone chronically, decreased to 105 or 110 from the clinic since he wants Xanax and they need to decrease him to 60 in order to rx - but can only decrease 5-10/week.  He has been on suboxone in the past and he either took too much and it made him sick or he took too little and it didn't help.  Methadone was the only thing that helped and it "saved my life", he has been on it 3 years.  He has had maybe 2 total stools in the  last week.  He hasn't vomited at all today, previous days were first thing in the AM or when he moves - maybe related to anxiety.    ER Course:  IVDU, septic emboli, endocarditis, osteo.  Completed Ancef, Cefdinir though 7/19.  Since finishing abx, no appetite, vomiting, diarrhea.  No fevers.  BP 90-100, looks very dehydration.  Lactate 2.1.  Seen in triage yesterday with bacteremia on smear but no growth on culture yet.  QTc 600, on methadone.       Review of Systems: As mentioned in the history of present illness. All other systems reviewed and are negative. Past Medical History:  Diagnosis Date   Anxiety    Endocarditis    Hepatitis    hx of hep C per pt   Opiate dependence ([place])    on methadone   Past Surgical History:  Procedure Laterality Date   APPLICATION OF ANGIOVAC N/A 11/28/2021   Procedure: APPLICATION OF ANGIOVAC;  Surgeon: Lajuana Matte, MD;  Location: [place];  Service: Vascular;  Laterality: N/A;   TEE WITHOUT CARDIOVERSION N/A 11/23/2021   Procedure: TRANSESOPHAGEAL ECHOCARDIOGRAM (TEE);  Surgeon: Werner Lean, MD;  Location: [facility] ENDOSCOPY;  Service: Cardiovascular;  Laterality: N/A;   TEE WITHOUT CARDIOVERSION N/A 11/28/2021   Procedure: TRANSESOPHAGEAL ECHOCARDIOGRAM (TEE);  Surgeon: Lajuana Matte, MD;  Location: [place];  Service: Open Heart Surgery;  Laterality: N/A;  tubes in ears     WRIST FRACTURE SURGERY     Social History:  reports that he has been smoking cigarettes. He has a 19.00 pack-year smoking history. He has never used smokeless tobacco. He reports current drug use. Drugs: Methamphetamines, Marijuana, and Heroin. He reports that he does not drink alcohol.  Allergies  Allergen Reactions   Penicillins Hives   Amoxil [Amoxicillin Trihydrate]     childhood    History reviewed. No pertinent family history.  Prior to Admission medications   Medication Sig Start Date End Date Taking? Authorizing Provider  ALPRAZolam (XANAX) 0.25  MG tablet Take 1 tablet (0.25 mg total) by mouth 2 (two) times daily as needed for anxiety. 12/12/21   Osvaldo Shipper, MD  cefdinir (OMNICEF) 300 MG capsule Take 300 mg by mouth 2 (two) times daily. 02/06/22   [provider]  ibuprofen (ADVIL) 600 MG tablet Take 1 tablet (600 mg total) by mouth every 8 (eight) hours as needed for moderate pain. 01/10/22   Leroy Sea, MD  methadone (DOLOPHINE) 1 MG/1ML solution Take 135 mg by mouth daily. 04/24/20   [provider]  methocarbamol (ROBAXIN) 500 MG tablet Take 1 tablet (500 mg total) by mouth every 8 (eight) hours as needed for muscle spasms. 12/12/21   Osvaldo Shipper, MD  ondansetron (ZOFRAN) 4 MG tablet Take 4 mg by mouth every 8 (eight) hours as needed. 02/06/22   [provider]  pantoprazole (PROTONIX) 40 MG tablet Take 1 tablet (40 mg total) by mouth daily. 12/13/21   Osvaldo Shipper, MD  Pseudoephedrine-guaiFENesin (GUAIFENESIN 600/PSE 120 PO) Take 600 mg by mouth 2 (two) times daily.    [provider]    Physical Exam: Vitals:   02/25/22 1315 02/25/22 1430 02/25/22 1530 02/25/22 1600  BP: 101/60 104/60 95/61 103/67  Pulse: 97 94 91 (!) 104  Resp: 14 20 19 17   Temp:      TempSrc:      SpO2: 100% 100% 97% (!) 64%  Weight:      Height:       General:  Appears calm and comfortable and is in NAD, somewhat anxious/jittery Eyes:  PERRL, EOMI, normal lids, iris ENT:  grossly normal hearing, lips & tongue, mmm; poor dentition Neck:  no LAD, masses or thyromegaly Cardiovascular:  RR with mild tachycardia, no r/g, 2/6 systolic murmur. No LE edema.  Respiratory:   CTA bilaterally with no wheezes/rales/rhonchi.  Normal respiratory effort. Abdomen:  soft, NT, ND Skin:  no rash or induration seen on limited exam, multiple tattoos Musculoskeletal:  grossly normal tone BUE/BLE, good ROM, no bony abnormality Psychiatric:  mildly anxious mood and affect, speech fluent and appropriate, AOx3 Neurologic:  CN 2-12  grossly intact, moves all extremities in coordinated fashion   Radiological Exams on Admission: Independently reviewed - see discussion in A/P where applicable  DG Chest 2 View  Result Date: 02/25/2022 CLINICAL DATA:  Provided history: Chest pain. Additional history provided: Patient reports pain in right lower back, intermittent chest pain, shortness of breath. EXAM: CHEST - 2 VIEW COMPARISON:  Prior chest radiographs 02/24/2022 and earlier. Chest CT 12/27/2018 FINDINGS: Heart size within normal limits. Unchanged bandlike and linear opacities within the right mid to lower lung field compatible with scarring. A more nodular opacity within the right lung base is also unchanged. Trace right pleural effusion. No appreciable airspace consolidation within the left lung. No evidence of pneumothorax. No acute bony abnormality identified. IMPRESSION: Nodular opacity within the right  lung base, unchanged from yesterday's chest radiograph. A cavitary nodule concerning for pulmonary infection was present at this site on the prior chest CT of 12/26/2021. The nodular opacity present on today's examination may reflect persistent pneumonia or a focus of nodular scarring. Trace right pleural effusion. Bandlike and linear foci of scarring within the right mid to lower lung field. Electronically Signed   By: Jackey Loge D.O.   On: 02/25/2022 12:30   DG Chest 2 View  Result Date: 02/24/2022 CLINICAL DATA:  Suspected sepsis EXAM: CHEST - 2 VIEW COMPARISON:  12/26/2021 FINDINGS: Cardiac size is within normal limits. There are no signs of pulmonary edema or focal pulmonary consolidation. Linear densities are seen in right lower lung field. Similar finding was seen in the previous study. There is no pleural effusion or pneumothorax. IMPRESSION: Linear densities in right lower lung field have not changed significantly suggesting scarring. Possibility of residual pneumonia in right lower lung field is not excluded. There are no  new infiltrates. There is no pleural effusion. Electronically Signed   By: Ernie Avena M.D.   On: 02/24/2022 19:12    EKG: Independently reviewed.  NSR with rate 99; prolonged QTc 591; nonspecific ST changes with no evidence of acute ischemia   Labs on Admission: I have personally reviewed the available labs and imaging studies at the time of the admission.  Pertinent labs:    Na++ 127 K+ 2.6 Albumin 2.8 Lactate 2.1; 2.7 yesterday WBC 14 Hgb 11 - stable Platelets 117 INR 1.3 UA: large Hgb, trace LE, 30 protein, few bacteria   Assessment and Plan: Principal Problem:   Bacteremia with gram positive cocci  Active Problems:   MSSA bacteremia   Chronic pain syndrome/methadone abuse    Anxiety   Prolonged QT interval   Hepatitis C, chronic ([name])   Polysubstance abuse ([name])   Tobacco abuse      Persistent MSSA bacteremia with native tricuspid valve endocarditis-septic emboli to lungs-T10-11 osteomyelitis  -Patient with 2 recent hospitalizations related to this issue -He was discharged to [facility] and completed IV Ancef and then took 2 weeks of Cefdinir -He reports developing mild symptoms similar to prior presentations while on Cefdinir, significant worse since completing course -He presented to the ER last night and gram stain showed GPC in clusters (staph) but culture is NTD -Lactate is mildly elevated, will trend -Repeat blood cultures -ID consult (Secure Chat message sent to Dr. Luciana Axe and then I spoke with Dr. Daiva Eves who recommended restarting Ancef and ID will see him in the AM)  Chronic pain -Patient with chronic pain and h/o polysubstance abuse (?ongoing use - UDS pending) -Continue methadone - he reported lower dose to me (105-110) than he did to the pharm tech; will continue reported dose on med rec with NO additional narcotics -He reports being weaned slowly down on his dose and this may be contributing to his current presentation -He has a prolonged QTc and  the dangers of methadone were discussed with the patient and he was offered suboxone but he declined and prefers to continue methadone despite the risks -Will add Tylenol and ibuprofen for pain  Anxiety -Patient reports h/o taking Xanax and that this helps his anxiety -He has not been given an rx for Xanax by his methadone clinic due to his high dose of methadone and is trying to wean down to 60 mg daily so that he can get Xanax -Will resume Xanax for now    Prolonged QTc  -As  noted above, increased risk due to methadone; patient understands the risk and desires to continue methadone regardless -Will attempt to avoid additional QT-prolonging medications such as PPI, nausea meds, SSRIs -Repeat EKG in AM   N/v/d -Could be associated with substance abuse/withdrawal, recurrent bacteremia, C diff (checking), or other -Will monitor for now -Clear liquids, advance diet as tolerated   Chronic hepatitis C -Treatment deferred to the outpatient setting -ID is consulting    Tobacco dependence  -Encourage cessation.   -This was discussed with the patient and should be reviewed on an ongoing basis.   -Patch ordered    Advance Care Planning:   Code Status: Full Code   Consults: ID; nutrition; PT/OT  DVT Prophylaxis: Lovenox  Family Communication: Girlfriend was present throughout encounter  Severity of Illness: The appropriate patient status for this patient is INPATIENT. Inpatient status is judged to be reasonable and necessary in order to provide the required intensity of service to ensure the patient's safety. The patient's presenting symptoms, physical exam findings, and initial radiographic and laboratory data in the context of their chronic comorbidities is felt to place them at high risk for further clinical deterioration. Furthermore, it is not anticipated that the patient will be medically stable for discharge from the hospital within 2 midnights of admission.   * I certify that at  the point of admission it is my clinical judgment that the patient will require inpatient hospital care spanning beyond 2 midnights from the point of admission due to high intensity of service, high risk for further deterioration and high frequency of surveillance required.*  Author: Karmen Bongo, MD 02/25/2022 5:20 PM  For on call review [email or web address].

## 2022-02-25 NOTE — ED Triage Notes (Signed)
Pt was here last night for eval for endocarditis but left due to long wait time. Had blood cx drawn but then left. Pt with n/v and unable to keep solids down. Patient w/ fever. Just finished course of abx. Pt w/ complaints of lower R sided back pain for a couple of days now. Reports intermittent chest pain, SHOB. Pt denies current chest pain however. [name].

## 2022-02-25 NOTE — Telephone Encounter (Signed)
Significant other has called stating patient is heading to [facility] ED now.

## 2022-02-25 NOTE — Telephone Encounter (Signed)
See note pt heading to ED

## 2022-02-25 NOTE — ED Notes (Signed)
Called pt and attempted to leave voicemail on pts phone requesting return to department, however mailbox is full and cannot receive any voice messages at this time.

## 2022-02-25 NOTE — Progress Notes (Signed)
Pharmacy Antibiotic Note  Reginald Clarke is a 31 y.o. male for which pharmacy has been consulted for cefazolin dosing for bacteremia.  Patient with a history of  IVDU -> persistent MSSA bacteremia with native tricuspid valve endocarditis, septic emboli to lungs, and T10-11 osteomyelitis. Hospitalization in April-May s/p angio vac, discharged on dalbavancin then re-hospitalized. Discharged on that admission to [facility] on IV ancef until 7/5. Patient presenting with n/v/d.  7/23 BCx w/ GPCs nothing on BCID  Estimated Creatinine Clearance: 92.9 mL/min (by C-G formula based on SCr of 1.19 mg/dL).  WBC 14.8; LA 2.7>1.8; T 103.1 F  Plan: Ancef 2g q8h Trend WBC, Fever, Renal function, & Clinical course F/u cultures, clinical course, WBC, fever  Height: 5\' 10"  (177.8 cm) Weight: 74 kg (163 lb 2.3 oz) IBW/kg (Calculated) : 73  Temp (24hrs), Avg:98.5 F (36.9 C), Min:97.4 F (36.3 C), Max:99.9 F (37.7 C)  Recent Labs  Lab 02/24/22 1841 02/25/22 1200  WBC 14.8* 14.0*  CREATININE 1.06 1.19  LATICACIDVEN 2.7* 2.1*    Estimated Creatinine Clearance: 92.9 mL/min (by C-G formula based on SCr of 1.19 mg/dL).    Allergies  Allergen Reactions   Penicillins Hives   Amoxil [Amoxicillin Trihydrate]     childhood    Antimicrobials this admission: ancef 7/24 >>  Thank you for allowing pharmacy to be a part of this patient's care.  8/24, PharmD, BCPS 02/25/2022 5:33 PM ED Clinical Pharmacist -  [phone]

## 2022-02-25 NOTE — ED Provider Triage Note (Signed)
Emergency Medicine Provider Triage Evaluation Note  Reginald Clarke , a 31 y.o. male  was evaluated in triage.  Pt complains of blood cultures that are positive for Staph aureus, called by PCP and told to come to ED.  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  He has had intermittent fevers, nausea and vomiting.  Unable to tolerate anything by mouth.  Having midline low back pain and right paraspinal pain/CVA tenderness.  Also endorsed some right lower quadrant tenderness.  No urinary symptoms, no saddle anesthesia, lower extremity weakness or numbness he denies any chest pain or shortness of breath to me.  Not on any blood thinners..  Review of Systems  Per HPI  Physical Exam  BP 105/73   Pulse (!) 112   Temp 98.5 F (36.9 C) (Oral)   Resp 16   Ht 5\' 10"  (1.778 m)   Wt 74 kg   SpO2 98%   BMI 23.41 kg/m  Gen:   Awake, no distress   Resp:  Normal effort  MSK:   Moves extremities without difficulty  Other:  Murmur.  Midline tenderness to lumbar spine.  Right CVA tenderness and right lower quadrant tenderness.  Mild tachycardia, lungs clear to auscultation bilaterally.  Skin is without any rashes, do not palpate any masses.  Cranial nerves II through XII are grossly intact, grip strength equal bilaterally.  Medical Decision Making  Medically screening exam initiated at 11:24 AM.  Appropriate orders placed.  Demers was informed that the remainder of the evaluation will be completed by another provider, this initial triage assessment does not replace that evaluation, and the importance of remaining in the ED until their evaluation is complete.  Positive blood cultures in patient with history of endocarditis unable to tolerate anything by mouth x1 week.  Suspect patient may need admission, work-up initiated.  Will not repeat blood cultures.   Kasin, [place], PA-C 02/25/22 1127

## 2022-02-25 NOTE — ED Provider Notes (Signed)
[facility] EMERGENCY DEPARTMENT Provider Note   CSN: [id number] Arrival date & time: 02/25/22  1029     History  Chief Complaint  Patient presents with   Nausea   Emesis    Reginald Clarke is a 31 y.o. male.  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  The patient states that over the last 5 days he has hardly been able to keep down any food or drinks.  He states that he has no appetite and when he does try to eat he feels nauseous and vomits.  He denies any associated abdominal pain or chest pain.  He states that he has had mid to right sided back pain since he was first diagnosed with endocarditis has been unchanged.  He states that he has been feeling subjectively febrile at home last measured fever approximately 1 to 2 weeks ago of 102 F.  He states that he completed his oral antibiotics that he was on for 2 weeks following a course of IV antibiotics as of 5 days ago.  He states that he is pending an outpatient TEE and follow-up with ID but states that doing to not being able to eat he decided to come to the emergency department.  He states that he is also had an associated nonproductive cough.  He denies any urinary symptoms.  The history is provided by the patient and the spouse.  Emesis      Home Medications Prior to Admission medications   Medication Sig Start Date End Date Taking? Authorizing Provider  ALPRAZolam (XANAX) 0.25 MG tablet Take 1 tablet (0.25 mg total) by mouth 2 (two) times daily as needed for anxiety. 12/12/21   Osvaldo Shipper, MD  cefdinir (OMNICEF) 300 MG capsule Take 300 mg by mouth 2 (two) times daily. 02/06/22   [provider]  ibuprofen (ADVIL) 600 MG tablet Take 1 tablet (600 mg total) by mouth every 8  (eight) hours as needed for moderate pain. 01/10/22   Leroy Sea, MD  methadone (DOLOPHINE) 1 MG/1ML solution Take 135 mg by mouth daily. 04/24/20   [provider]  methocarbamol (ROBAXIN) 500 MG tablet Take 1 tablet (500 mg total) by mouth every 8 (eight) hours as needed for muscle spasms. 12/12/21   Osvaldo Shipper, MD  ondansetron (ZOFRAN) 4 MG tablet Take 4 mg by mouth every 8 (eight) hours as needed. 02/06/22   [provider]  pantoprazole (PROTONIX) 40 MG tablet Take 1 tablet (40 mg total) by mouth daily. 12/13/21   Osvaldo Shipper, MD  Pseudoephedrine-guaiFENesin (GUAIFENESIN 600/PSE 120 PO) Take 600 mg by mouth 2 (two) times daily.    [provider]      Allergies    Penicillins and Amoxil [amoxicillin trihydrate]    Review of Systems   Review of Systems  Gastrointestinal:  Positive for vomiting.    Physical Exam Updated Vital Signs BP 101/60   Pulse 97   Temp 98.2 F (36.8 C) (Oral)   Resp 14   Ht 5\' 10"  (1.778 m)   Wt 74 kg   SpO2 100%   BMI 23.41 kg/m  Physical Exam Vitals and nursing note reviewed.  Constitutional:      General: He is not in acute distress.    Appearance: He  is ill-appearing (Chronically ill appearing).  HENT:     Head: Normocephalic.  Eyes:     Extraocular Movements: Extraocular movements intact.     Conjunctiva/sclera: Conjunctivae normal.  Cardiovascular:     Rate and Rhythm: Regular rhythm. Tachycardia present.  Pulmonary:     Effort: Pulmonary effort is normal.     Breath sounds: Normal breath sounds.  Abdominal:     General: Abdomen is flat.     Palpations: Abdomen is soft.     Tenderness: There is no abdominal tenderness.  Musculoskeletal:        General: Normal range of motion.     Cervical back: Normal range of motion and neck supple.  Skin:    General: Skin is warm.  Neurological:     General: No focal deficit present.     Mental Status: He is alert and oriented to person, place, and time.   Psychiatric:        Mood and Affect: Mood normal.      ED Results / Procedures / Treatments   Labs (all labs ordered are listed, but only abnormal results are displayed) Labs Reviewed  CBC WITH DIFFERENTIAL/PLATELET - Abnormal; Notable for the following components:      Result Value   WBC 14.0 (*)    RBC 4.06 (*)    Hemoglobin 11.0 (*)    HCT 33.4 (*)    Platelets 117 (*)    Neutro Abs 11.6 (*)    Monocytes Absolute 1.3 (*)    Abs Immature Granulocytes 0.08 (*)    All other components within normal limits  COMPREHENSIVE METABOLIC PANEL - Abnormal; Notable for the following components:   Sodium 127 (*)    Potassium 2.6 (*)    Chloride 87 (*)    Glucose, Bld 104 (*)    Calcium 8.4 (*)    Albumin 2.8 (*)    All other components within normal limits  URINALYSIS, ROUTINE W REFLEX MICROSCOPIC - Abnormal; Notable for the following components:   Color, Urine AMBER (*)    APPearance HAZY (*)    Hgb urine dipstick LARGE (*)    Protein, ur 30 (*)    Leukocytes,Ua TRACE (*)    Bacteria, UA FEW (*)    All other components within normal limits  LACTIC ACID, PLASMA - Abnormal; Notable for the following components:   Lactic Acid, Venous 2.1 (*)    All other components within normal limits  PROTIME-INR - Abnormal; Notable for the following components:   Prothrombin Time 15.9 (*)    INR 1.3 (*)    All other components within normal limits  LIPASE, BLOOD  LACTIC ACID, PLASMA  MAGNESIUM    EKG EKG Interpretation  Date/Time:  Monday February 25 2022 13:07:59 EDT Ventricular Rate:  99 PR Interval:  122 QRS Duration: 111 QT Interval:  460 QTC Calculation: 591 R Axis:   35 Text Interpretation: Sinus rhythm Prolonged QT interval NO SIGNIFICANT CHANGE SINCE LAST TRACING YESTERDAY Confirmed by Arturo Morton ([id number]) on 02/25/2022 1:26:09 PM  Radiology DG Chest 2 View  Result Date: 02/25/2022 CLINICAL DATA:  Provided history: Chest pain. Additional history provided: Patient  reports pain in right lower back, intermittent chest pain, shortness of breath. EXAM: CHEST - 2 VIEW COMPARISON:  Prior chest radiographs 02/24/2022 and earlier. Chest CT 12/27/2018 FINDINGS: Heart size within normal limits. Unchanged bandlike and linear opacities within the right mid to lower lung field compatible with scarring. A more nodular opacity within the right lung  base is also unchanged. Trace right pleural effusion. No appreciable airspace consolidation within the left lung. No evidence of pneumothorax. No acute bony abnormality identified. IMPRESSION: Nodular opacity within the right lung base, unchanged from yesterday's chest radiograph. A cavitary nodule concerning for pulmonary infection was present at this site on the prior chest CT of 12/26/2021. The nodular opacity present on today's examination may reflect persistent pneumonia or a focus of nodular scarring. Trace right pleural effusion. Bandlike and linear foci of scarring within the right mid to lower lung field. Electronically Signed   By: Jackey Loge D.O.   On: 02/25/2022 12:30   DG Chest 2 View  Result Date: 02/24/2022 CLINICAL DATA:  Suspected sepsis EXAM: CHEST - 2 VIEW COMPARISON:  12/26/2021 FINDINGS: Cardiac size is within normal limits. There are no signs of pulmonary edema or focal pulmonary consolidation. Linear densities are seen in right lower lung field. Similar finding was seen in the previous study. There is no pleural effusion or pneumothorax. IMPRESSION: Linear densities in right lower lung field have not changed significantly suggesting scarring. Possibility of residual pneumonia in right lower lung field is not excluded. There are no new infiltrates. There is no pleural effusion. Electronically Signed   By: Ernie Avena M.D.   On: 02/24/2022 19:12    Procedures Procedures    Medications Ordered in ED Medications  potassium chloride SA (KLOR-CON M) CR tablet 40 mEq (has no administration in time range)   potassium chloride 10 mEq in 100 mL IVPB (has no administration in time range)  lactated ringers bolus 1,000 mL (has no administration in time range)  lactated ringers bolus 1,000 mL (1,000 mLs Intravenous New Bag/Given 02/25/22 1313)    ED Course/ Medical Decision Making/ A&P Clinical Course as of 02/25/22 1518  Mon Feb 25, 2022  1239 Chest x-ray with nodular opacity in RLL field consistent with prior area of pulmonary infection [VK]  1247 Platelets(!): 117 [VK]  1326 Thrombocytopenia worsening compared to yesterday with stable WBC. Lactate with mild improvement. Will reassess after IVF bolus [VK]  1431 Patient's potassium 2.6, he will receive IV and PO repletion. Worsening hyponatremia and hypochloremia consistent with dehydration with soft BP and will be given additional IVF bolus. 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  Patient was seen in triage last night and had labs performed which did show signs of dehydration and had blood cultures sent which have no growth on preliminary evaluation.  Patient here is afebrile but tachycardic, concerning for dehydration, acute infection. No focal neurologic deficits making spinal epidural abscess or other spinal cause of symptoms.  Abdomen is soft and nontender making cholelithiasis, nephrolithiasis or pancreatitis unlikely.  Amount and/or Complexity of Data Reviewed External Data Reviewed: labs and notes.    Details: Lactate 2.7 yesterday, labs with hyponatremia, hypochloremia and hypokalemia, consistent with dehydration.  Blood culture shows gram-positive cocci on smear but no  growth to date  Patient was on IV ancef until 7/5 for endocarditis with septic emboli and spinal osteomyelitis and then completed 2  week course of PO cefdinir completed on 7/19 Labs: ordered. Decision-making details documented in ED Course. Radiology: ordered. ECG/medicine tests: ordered.  Risk Prescription drug management. Decision regarding hospitalization.          Final Clinical Impression(s) / ED Diagnoses Final diagnoses:  Thrombocytopenia ([name])  Hypokalemia  Dehydration    Rx / DC Orders ED Discharge Orders          Ordered    Urine Culture        02/25/22 1126              Willard, [place] K, DO 02/25/22 1518

## 2022-02-25 NOTE — ED Notes (Signed)
Dinner Tray Ordered at 18:13

## 2022-02-26 ENCOUNTER — Inpatient Hospital Stay (HOSPITAL_COMMUNITY): Payer: BLUE CROSS/BLUE SHIELD

## 2022-02-26 DIAGNOSIS — M546 Pain in thoracic spine: Secondary | ICD-10-CM

## 2022-02-26 DIAGNOSIS — R7989 Other specified abnormal findings of blood chemistry: Secondary | ICD-10-CM | POA: Diagnosis present

## 2022-02-26 DIAGNOSIS — B9561 Methicillin susceptible Staphylococcus aureus infection as the cause of diseases classified elsewhere: Secondary | ICD-10-CM

## 2022-02-26 DIAGNOSIS — Z8679 Personal history of other diseases of the circulatory system: Secondary | ICD-10-CM | POA: Diagnosis not present

## 2022-02-26 DIAGNOSIS — R7881 Bacteremia: Secondary | ICD-10-CM | POA: Diagnosis not present

## 2022-02-26 DIAGNOSIS — F1910 Other psychoactive substance abuse, uncomplicated: Secondary | ICD-10-CM

## 2022-02-26 DIAGNOSIS — A419 Sepsis, unspecified organism: Secondary | ICD-10-CM

## 2022-02-26 DIAGNOSIS — F1120 Opioid dependence, uncomplicated: Secondary | ICD-10-CM

## 2022-02-26 DIAGNOSIS — G894 Chronic pain syndrome: Secondary | ICD-10-CM

## 2022-02-26 DIAGNOSIS — I38 Endocarditis, valve unspecified: Secondary | ICD-10-CM

## 2022-02-26 DIAGNOSIS — Z720 Tobacco use: Secondary | ICD-10-CM

## 2022-02-26 DIAGNOSIS — R9431 Abnormal electrocardiogram [ECG] [EKG]: Secondary | ICD-10-CM

## 2022-02-26 DIAGNOSIS — F419 Anxiety disorder, unspecified: Secondary | ICD-10-CM

## 2022-02-26 DIAGNOSIS — B182 Chronic viral hepatitis C: Secondary | ICD-10-CM

## 2022-02-26 DIAGNOSIS — I2782 Chronic pulmonary embolism: Secondary | ICD-10-CM

## 2022-02-26 DIAGNOSIS — I2690 Septic pulmonary embolism without acute cor pulmonale: Secondary | ICD-10-CM

## 2022-02-26 LAB — ECHOCARDIOGRAM LIMITED
Height: 70 in
Weight: 2610.25 oz

## 2022-02-26 LAB — CBC
HCT: 27.8 % — ABNORMAL LOW (ref 39.0–52.0)
Hemoglobin: 9.3 g/dL — ABNORMAL LOW (ref 13.0–17.0)
MCH: 27.5 pg (ref 26.0–34.0)
MCHC: 33.5 g/dL (ref 30.0–36.0)
MCV: 82.2 fL (ref 80.0–100.0)
Platelets: 112 10*3/uL — ABNORMAL LOW (ref 150–400)
RBC: 3.38 MIL/uL — ABNORMAL LOW (ref 4.22–5.81)
RDW: 15.3 % (ref 11.5–15.5)
WBC: 9.9 10*3/uL (ref 4.0–10.5)
nRBC: 0 % (ref 0.0–0.2)

## 2022-02-26 LAB — BASIC METABOLIC PANEL
Anion gap: 7 (ref 5–15)
BUN: 10 mg/dL (ref 6–20)
CO2: 29 mmol/L (ref 22–32)
Calcium: 7.7 mg/dL — ABNORMAL LOW (ref 8.9–10.3)
Chloride: 95 mmol/L — ABNORMAL LOW (ref 98–111)
Creatinine, Ser: 0.95 mg/dL (ref 0.61–1.24)
GFR, Estimated: >60 mL/min (ref >60)
Glucose, Bld: 86 mg/dL (ref 70–99)
Potassium: 2.9 mmol/L — ABNORMAL LOW (ref 3.5–5.1)
Sodium: 131 mmol/L — ABNORMAL LOW (ref 135–145)

## 2022-02-26 MED ORDER — GADOBUTROL 1 MMOL/ML IV SOLN
7.0000 mL | Freq: Once | INTRAVENOUS | Status: AC | PRN
  Administered 2022-02-26: 7 mL via INTRAVENOUS

## 2022-02-26 MED ORDER — ADULT MULTIVITAMIN W/MINERALS CH
1.0000 | ORAL_TABLET | Freq: Every day | ORAL | Status: DC
  Administered 2022-02-26 – 2022-04-22 (×56): 1 via ORAL
  Filled 2022-02-26 (×57): qty 1

## 2022-02-26 MED ORDER — LACTATED RINGERS IV SOLN
INTRAVENOUS | Status: DC
Start: 2022-02-26 — End: 2022-02-27

## 2022-02-26 MED ORDER — ENSURE ENLIVE PO LIQD
237.0000 mL | Freq: Two times a day (BID) | ORAL | Status: DC
  Administered 2022-02-26 – 2022-03-01 (×4): 237 mL via ORAL

## 2022-02-26 NOTE — Progress Notes (Signed)
   02/26/22 0942  Assess: MEWS Score  Temp (!) 97.5 F (36.4 C)  BP 100/68  MAP (mmHg) 76  Pulse Rate (!) 110  Resp 17  SpO2 99 %  O2 Device Room Air  Assess: MEWS Score  MEWS Temp 0  MEWS Systolic 1  MEWS Pulse 1  MEWS RR 0  MEWS LOC 0  MEWS Score 2  MEWS Score Color Yellow  Assess: if the MEWS score is Yellow or Red  Were vital signs taken at a resting state? Yes  Does the patient meet 2 or more of the SIRS criteria? No  MEWS guidelines implemented *See Row Information* Yes  Treat  MEWS Interventions Other (Comment) (more frequent vital sign check)  Pain Scale 0-10  Pain Score 4  Take Vital Signs  Increase Vital Sign Frequency  Yellow: Q 2hr X 2 then Q 4hr X 2, if remains yellow, continue Q 4hrs  Escalate  MEWS: Escalate Yellow: discuss with charge nurse/RN and consider discussing with provider and RRT  Notify: Charge Nurse/RN  Name of Charge Nurse/RN Notified Alferd Apa  Date Charge Nurse/RN Notified 02/26/22  Time Charge Nurse/RN Notified 1045  Notify: Provider  Provider Name/Title Dr Ophelia Charter  Date Provider Notified 02/26/22  Time Provider Notified 1108  Method of Notification Page  Document  Progress note created (see row info) Yes  Assess: SIRS CRITERIA  SIRS Temperature  0  SIRS Pulse 1  SIRS Respirations  0  SIRS WBC 1  SIRS Score Sum  2   Pt admitted from ED already in yellow MEWS. Protocol initiated

## 2022-02-26 NOTE — Progress Notes (Signed)
OT Cancellation Note  Patient Details Name: Reginald Clarke MRN: [id number] DOB: [birth year]   Cancelled Treatment:    Reason Eval/Treat Not Completed: OT screened, no needs identified, will sign off.  Pt per his report is independent with all functional mobility and ADL tasks.    Jiya Kissinger OTR/L 02/26/2022, 11:57 AM

## 2022-02-26 NOTE — Progress Notes (Addendum)
PROGRESS NOTE    Reginald Clarke  WVP:[id number] DOB: 09-Oct-1990 DOA: 02/25/2022 PCP: Bary Leriche, PA-C    Brief Narrative:  Reginald Clarke is a 31 y.o. 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  Patient was continued on IV Ancef and discharged to [facility] for completion of IV Ancef through 7/5 with plan to transition to Cefdinir through 7/19.  Patient was feeling better when he left [facility] but still had some symptoms.  Shortly thereafter.  Patient started having subjective fevers and after completion of antibiotics he persisted to have decreased energy strength and motivation with feeling of chills and night sweats.  He also had nausea vomiting and diarrhea and had impaired appetite with worsening of back pain.  Of note patient is on  methadone chronically, decreased to 105 or 110 from the clinic since he wants Xanax and they need to decrease him to 60 in order to rx - but can only decrease 5-10/week.  Patient has been on methadone for 3 years..  In the ED patient had low blood pressure looked very dehydrated lactate was elevated at 2.1.    QTc 600, on methadone.  Patient was then admitted hospital for further evaluation and treatment.   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  Was recently discharged to [facility] and has completed IV Ancef and 2 weeks of cefdinir.  Patient presented to the ED and Gram stain showed gram-positive cocci in clusters.  Follow blood cultures have been sent.  ID has been consulted who recommended starting Ancef.   Temperature max of 103.1 F  Chronic pain -Patient with chronic pain and h/o polysubstance abuse.  Continue methadone.  Dose was decreased.  He reports being weaned down slowly on this dose and might be contributing to his current presentation.  Has prolonged QTc in the he declines to discontinue methadone at this time.  Continue Tylenol and Motrin.   Anxiety On Xanax.  Dose of methadone was decreased to adjust Xanax in the regimen.    Prolonged QTc  Despite the risk of being on methadone patient wishes to continue on it.  We will try to avoid SSRI PPI nausea medicine except if possible.  QTc from repeat EKG today at 425  Nausea vomiting diarrhea. Could be secondary to substance abuse or withdrawal.  Improved at this time.  Would Zofran her QT prolonging medications if possible.   Chronic hepatitis C ID will be consulted.  Tobacco dependence  Continue nicotine patch.    DVT prophylaxis: enoxaparin (LOVENOX) injection 40 mg Start: 02/25/22 1700   Code Status:     Code Status: Full Code  Disposition: Home  Status is: Inpatient  Remains inpatient appropriate because: IV antibiotic,   Family Communication:  Spoke with the patient's girlfriend at bedside.  Consultants:  Infectious disease.  Procedures:  None  Antimicrobials:  Cefazolin  Anti-infectives (From admission, onward)    Start     Dose/Rate Route Frequency Ordered Stop   02/25/22 1745  ceFAZolin (ANCEF) IVPB 2g/100 mL premix        2 g 200 mL/hr over 30 Minutes Intravenous Every 8 hours 02/25/22 1734  Subjective: Today, patient was seen and examined at bedside.  Patient denies any nausea, vomiting, abdominal pain or diarrhea.  Has been able to tolerate clears.  Objective: Vitals:   02/26/22 0745 02/26/22 0856 02/26/22 0900 02/26/22 0942  BP: (!) 93/56 (!) 95/52 100/64 100/68  Pulse: 86 99 100 (!) 110  Resp: (!) 25 (!) 21 20 17   Temp:  98 F (36.7 C)  (!) 97.5 F (36.4 C)  TempSrc:    Oral   SpO2: 96% 96% 97% 99%  Weight:      Height:        Intake/Output Summary (Last 24 hours) at 02/26/2022 1045 Last data filed at 02/26/2022 0402 Gross per 24 hour  Intake 2298.38 ml  Output --  Net 2298.38 ml   Filed Weights   02/25/22 1108  Weight: 74 kg    Physical Examination: Body mass index is 23.41 kg/m.   General:  Average built, not in obvious distress HENT:   No scleral pallor or icterus noted. Oral mucosa is moist.  Poor dentition. Chest:  Clear breath sounds.  Diminished breath sounds bilaterally. No crackles or wheezes.  CVS: S1 &S2 heard. No murmur.  Regular rate and rhythm. Abdomen: Soft, nontender, nondistended.  Bowel sounds are heard.   Extremities: No cyanosis, clubbing or edema.  Peripheral pulses are palpable. Psych: Alert, awake and oriented, normal mood CNS:  No cranial nerve deficits.  Power equal in all extremities.   Skin: Warm and dry.  No rashes noted.  Data Reviewed:   CBC: Recent Labs  Lab 02/24/22 1841 02/25/22 1200 02/26/22 0326  WBC 14.8* 14.0* 9.9  NEUTROABS 11.7* 11.6*  --   HGB 12.2* 11.0* 9.3*  HCT 37.8* 33.4* 27.8*  MCV 83.6 82.3 82.2  PLT 123* 117* 112*    Basic Metabolic Panel: Recent Labs  Lab 02/24/22 1841 02/25/22 1200 02/25/22 1820 02/26/22 0326  NA 133* 127*  --  131*  K 3.1* 2.6*  --  2.9*  CL 91* 87*  --  95*  CO2 28 28  --  29  GLUCOSE 136* 104*  --  86  BUN 12 15  --  10  CREATININE 1.06 1.19  --  0.95  CALCIUM 8.6* 8.4*  --  7.7*  MG  --   --  1.3*  --     Liver Function Tests: Recent Labs  Lab 02/24/22 1841 02/25/22 1200  AST 22 24  ALT 14 12  ALKPHOS 55 56  BILITOT 0.8 1.1  PROT 7.7 7.3  ALBUMIN 3.0* 2.8*     Radiology Studies: DG Chest 2 View  Result Date: 02/25/2022 CLINICAL DATA:  Provided history: Chest pain. Additional history provided: Patient reports pain in right lower back, intermittent chest pain, shortness of breath. EXAM: CHEST - 2 VIEW COMPARISON:  Prior chest radiographs  02/24/2022 and earlier. Chest CT 12/27/2018 FINDINGS: Heart size within normal limits. Unchanged bandlike and linear opacities within the right mid to lower lung field compatible with scarring. A more nodular opacity within the right lung base is also unchanged. Trace right pleural effusion. No appreciable airspace consolidation within the left lung. No evidence of pneumothorax. No acute bony abnormality identified. IMPRESSION: Nodular opacity within the right lung base, unchanged from yesterday's chest radiograph. A cavitary nodule concerning for pulmonary infection was present at this site on the prior chest CT of 12/26/2021. The nodular opacity present on today's examination may reflect persistent pneumonia or a focus of nodular scarring. Trace right pleural effusion.  Bandlike and linear foci of scarring within the right mid to lower lung field. Electronically Signed   By: Jackey Loge D.O.   On: 02/25/2022 12:30   DG Chest 2 View  Result Date: 02/24/2022 CLINICAL DATA:  Suspected sepsis EXAM: CHEST - 2 VIEW COMPARISON:  12/26/2021 FINDINGS: Cardiac size is within normal limits. There are no signs of pulmonary edema or focal pulmonary consolidation. Linear densities are seen in right lower lung field. Similar finding was seen in the previous study. There is no pleural effusion or pneumothorax. IMPRESSION: Linear densities in right lower lung field have not changed significantly suggesting scarring. Possibility of residual pneumonia in right lower lung field is not excluded. There are no new infiltrates. There is no pleural effusion. Electronically Signed   By: Ernie Avena M.D.   On: 02/24/2022 19:12      LOS: 1 day    Joycelyn Das, MD [facility] Available via Epic secure chat 7am-7pm After these hours, please refer to coverage provider listed on [email or web address] 02/26/2022, 10:45 AM

## 2022-02-26 NOTE — Evaluation (Signed)
Physical Therapy Evaluation Patient Details Name: Reginald Clarke MRN: [id number] DOB: [birth year] Today's Date: 02/26/2022  History of Present Illness  Pt is a 31 y/o male admitted secondary to nausea/vomiting and fevers. 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 Recent admission and LTAC stay for same. PMH includes hepatitis C, polysubstance abuse and tobacco use.  Clinical Impression  Pt admitted secondary to problem above with deficits below. Pt overall at an independent level with mobility tasks. Able to perform DGI tasks without LOB. Pt reports family can assist if needed. Reviewed back precautions for comfort. No further skilled PT needs at this time. Will sign off. If needs change, please re-consult.        Recommendations for follow up therapy are one component of a multi-disciplinary discharge planning process, led by the attending physician.  Recommendations may be updated based on patient status, additional functional criteria and insurance authorization.  Follow Up Recommendations No PT follow up      Assistance Recommended at Discharge PRN  Patient can return home with the following  Assistance with cooking/housework    Equipment Recommendations None recommended by PT  Recommendations for Other Services       Functional Status Assessment Patient has had a recent decline in their functional status and demonstrates the ability to make significant improvements in function in a reasonable and predictable amount of time.     Precautions / Restrictions Precautions Precautions: Back (for comfort) Precaution Booklet Issued: No Precaution Comments: Verbally reviewed back precautions for comfort Restrictions Weight Bearing Restrictions: No      Mobility  Bed Mobility Overal bed mobility: Independent                  Transfers Overall transfer level: Independent                       Ambulation/Gait Ambulation/Gait assistance: Independent Gait Distance (Feet): 300 Feet Assistive device: None Gait Pattern/deviations: WFL(Within Functional Limits) Gait velocity: WFL     General Gait Details: Able to perform DGI tasks without LOB.  Stairs            Wheelchair Mobility    Modified Rankin (Stroke Patients Only)       Balance Overall balance assessment: Independent                               Standardized Balance Assessment Standardized Balance Assessment : Dynamic Gait Index   Dynamic Gait Index Level Surface: Normal Change in Gait Speed: Normal Gait with Horizontal Head Turns: Normal Gait with Vertical Head Turns: Normal Gait and Pivot Turn: Normal Step Over Obstacle: Normal Step Around Obstacles: Normal       Pertinent Vitals/Pain Pain Assessment Pain Assessment: 0-10 Pain Score: 7  Pain Location: back Pain Descriptors / Indicators: Shooting Pain Intervention(s): Limited activity within patient's tolerance, Monitored during session, Repositioned    Home Living Family/patient expects to be discharged to:: Private residence Living Arrangements: Other relatives (grandma, grandpa) Available Help at Discharge: Family Type of Home: Mobile home Home Access: Stairs to enter Entrance Stairs-Rails: Right;Left;Can reach both Entrance Stairs-Number of Steps: 5   Home Layout: One level Home Equipment: Other (comment) (walking sticks)      Prior Function Prior Level of Function : Independent/Modified Independent  Hand Dominance        Extremity/Trunk Assessment   Upper Extremity Assessment Upper Extremity Assessment: Overall WFL for tasks assessed    Lower Extremity Assessment Lower Extremity Assessment: RLE deficits/detail RLE Deficits / Details: Reports limited ankle ROM secondary to previous fx    Cervical / Trunk Assessment Cervical / Trunk Assessment: Other  exceptions Cervical / Trunk Exceptions: Thoracic osteomyelitis  Communication   Communication: No difficulties  Cognition Arousal/Alertness: Awake/alert Behavior During Therapy: WFL for tasks assessed/performed Overall Cognitive Status: Within Functional Limits for tasks assessed                                          General Comments      Exercises     Assessment/Plan    PT Assessment Patient does not need any further PT services  PT Problem List         PT Treatment Interventions      PT Goals (Current goals can be found in the Care Plan section)  Acute Rehab PT Goals Patient Stated Goal: to feel better PT Goal Formulation: All assessment and education complete, DC therapy Time For Goal Achievement: 02/26/22 Potential to Achieve Goals: Good    Frequency       Co-evaluation               AM-PAC PT "6 Clicks" Mobility  Outcome Measure Help needed turning from your back to your side while in a flat bed without using bedrails?: None Help needed moving from lying on your back to sitting on the side of a flat bed without using bedrails?: None Help needed moving to and from a bed to a chair (including a wheelchair)?: None Help needed standing up from a chair using your arms (e.g., wheelchair or bedside chair)?: None Help needed to walk in hospital room?: None Help needed climbing 3-5 steps with a railing? : None 6 Click Score: 24    End of Session   Activity Tolerance: Patient tolerated treatment well Patient left: in bed;with call bell/phone within reach Nurse Communication: Mobility status PT Visit Diagnosis: Other abnormalities of gait and mobility (R26.89)    Time: 1018-1030 PT Time Calculation (min) (ACUTE ONLY): 12 min   Charges:   PT Evaluation $PT Eval Low Complexity: 1 Low          Cindee Salt, DPT  Acute Rehabilitation Services  Office: [phone]   Lehman Prom 02/26/2022, 10:46 AM

## 2022-02-26 NOTE — Progress Notes (Signed)
Initial Nutrition Assessment  DOCUMENTATION CODES:   Not applicable  INTERVENTION:  RN to advance to regular diet, encouraged PO intake Ensure Enlive po BID, each supplement provides 350 kcal and 20 grams of protein. MVI with minerals daily  NUTRITION DIAGNOSIS:   Inadequate oral intake related to vomiting, nausea as evidenced by per patient/family report.  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 Pt reports recent poor PO with nausea, vomiting, and diarrhea. Hx complicated by endocarditis which pt recently finished antibiotics for <1 week ago.  Met with pt at bedside. States that he is feeling hungry today and is looking forward to ordering his meal. Reports that over the past week intake has been minimal as he was having constant nausea and vomiting. Since getting back on antibiotics, he is feeling better.   Inquired about weight, pt states 175 UBW and about a 10 lb wt loss over the last week. Pt agreeable to ensure.  Nutritionally Relevant Medications: Scheduled Meds:  docusate sodium  100 mg Oral BID   methadone  135 mg Oral Daily   pantoprazole  40 mg Oral Daily   Continuous Infusions:  lactated ringers     Labs Reviewed: Na 131, chloride 95 K 2.9 Mg 1.3  NUTRITION - FOCUSED PHYSICAL EXAM: Flowsheet Row Most Recent Value  Orbital Region Mild depletion  Upper Arm Region No depletion  Thoracic and Lumbar Region No depletion  Buccal Region No depletion  Temple Region Mild depletion  Clavicle Bone Region Mild depletion  Clavicle and Acromion Bone Region No depletion  Scapular Bone Region No depletion   Dorsal Hand No depletion  Patellar Region No depletion  Anterior Thigh Region No depletion  Posterior Calf Region Mild depletion  Edema (RD Assessment) None  Hair Reviewed  Eyes Reviewed  Mouth Reviewed  Skin Reviewed  Nails Reviewed   Diet Order:   Diet Order             Diet regular Room service appropriate? Yes; Fluid consistency: Thin  Diet effective now                   EDUCATION NEEDS:  No education needs have been identified at this time  Skin:  Skin Assessment: Reviewed RN Assessment  Last BM:  unsure  Height:  Ht Readings from Last 1 Encounters:  02/25/22 5' 10"  (1.778 m)    Weight:  Wt Readings from Last 1 Encounters:  02/25/22 74 kg    Ideal Body Weight:  75.5 kg  BMI:  Body mass index is 23.41 kg/m.  Estimated Nutritional Needs:  Kcal:  2300-2500 kcal/d Protein:  115-130g/d Fluid:  >/=2.3L/d    Ranell Patrick, RD, LDN Clinical Dietitian RD pager # available in [facility]  After hours/weekend pager # available in [facility]

## 2022-02-26 NOTE — Progress Notes (Signed)
  Echocardiogram 2D Echocardiogram limited  has been performed.  Reginald Clarke M 02/26/2022, 3:37 PM

## 2022-02-26 NOTE — TOC Initial Note (Addendum)
Transition of Care [facility]) - Initial/Assessment Note    Patient Details  Name: Reginald Clarke MRN: [id number] Date of Birth: May 08, 1991  Transition of [facility]) CM/SW Contact:    Kingsley Plan, RN Phone Number: 02/26/2022, 10:00 AM  Clinical Narrative:                 Spoke to patient and significant other at bedside.   Patient recently discharged from hospital to  [facility] for long term IV ABX.Patient discharged from [facility] back to his grand parents home.    If long term IV ABX needed again he does not want to return to [employer].    PCP is Alyssa Allwardt  Confirmed address and phone number.   Patient has transportation      Transition of Care Department [facility])  will continue to monitor patient advancement through interdisciplinary progression rounds. If new patient transition needs arise, please place a TOC consult.      Expected Discharge Plan: Home/Self Care Barriers to Discharge: Continued Medical Work up   Patient Goals and CMS Choice Patient states their goals for this hospitalization and ongoing recovery are:: to return to home      Expected Discharge Plan and Services Expected Discharge Plan: Home/Self Care   Discharge Planning Services: CM Consult   Living arrangements for the past 2 months: Single Family Home                 DME Arranged: N/A         HH Arranged: NA          Prior Living Arrangements/Services Living arrangements for the past 2 months: Single Family Home Lives with:: Other (Comment) (grand parents) Patient language and need for interpreter reviewed:: Yes Do you feel safe going back to the place where you live?: Yes      Need for Family Participation in Patient Care: Yes (Comment) Care giver support system in place?: Yes (comment)   Criminal Activity/Legal Involvement Pertinent to Current Situation/Hospitalization: No - Comment as needed  Activities of Daily Living      Permission Sought/Granted    Permission granted to share information with : Yes, Verbal Permission Granted  Share Information with NAME: [name] significant other           Emotional Assessment Appearance:: Appears stated age Attitude/Demeanor/Rapport: Engaged Affect (typically observed): Accepting Orientation: : Oriented to Self, Oriented to Place, Oriented to  Time, Oriented to Situation Alcohol / Substance Use: Not Applicable Psych Involvement: No (comment)  Admission diagnosis:  Bacteremia [R78.81] Dehydration [E86.0] Hypokalemia [E87.6] Thrombocytopenia ([name]) [D69.6] Patient Active Problem List   Diagnosis Date Noted   Bacteremia with gram positive cocci  12/26/2021   Normocytic anemia 12/26/2021   Prolonged QT interval 12/26/2021   Fever 12/25/2021   Smoking 12/25/2021   Anxiety 12/05/2021   Septic pulmonary embolism ([name]) 11/25/2021   Hepatitis C, chronic ([name]) 11/24/2021   Polysubstance abuse ([name]) 11/17/2021   Tobacco abuse 11/15/2021   Endocarditis of tricuspid valve 11/15/2021   MSSA bacteremia 11/14/2021   Chronic pain syndrome/methadone abuse  11/14/2021   GERD (gastroesophageal reflux disease) 11/14/2021   PCP:  Bary Leriche, PA-C Pharmacy:   [facility] [address], [place] - [address] [place] [address] Phone: [phone] Fax: [phone]     Social Determinants of Health (SDOH) Interventions    Readmission Risk Interventions     No data to display

## 2022-02-26 NOTE — ED Notes (Signed)
Breakfast order placed ?

## 2022-02-26 NOTE — Consult Note (Signed)
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 S/p initial cefazolin, then discharged with weekly dalbavancin #Persistent mssa bacteremia 12/22/21 readmitted; s/p weeks cefazolin by 7/05 with planned 2 more weeks cefadroxil  #n/v/diarrhea  #Chronic hep C #ivdu  His n/v/d doesn't appear to be cdiff/gastroenteritis. Query if related to marijuanna use with cyclic vomiting syndrome although appears to be his first episode  Sepsis and back pain/chest pain-cough concerning for relapse of mssa process. Will need repeat focused imaging and blood cx and agree empiric cefazolin abx for mssa again. Denies current ivdu since discharged from [facility]   Plan: Repeat echo, chest ct, and thoracic spine mri to w/u sepsis Gi cdiff testing per primary team but patient's diarrhea had resolved/much improved since here and he doesn't clinically appear to have cdiff so can d/c cdiff testing if no further diarrhea today Continue cefazolin F/u blood cx Discussed with primary team   I spent 75 minute reviewing data/chart, and coordinating care and >50% direct face to face time providing counseling/discussing diagnostics/treatment plan with patient      ------------------------------------------------ Principal Problem:   Bacteremia with gram positive cocci  Active Problems:   MSSA bacteremia   Chronic pain syndrome/methadone abuse    Tobacco abuse   Polysubstance abuse ([name])   Hepatitis C, chronic ([name])   Anxiety   Prolonged QT interval    HPI: Reginald Clarke is a 31 y.o. male hx ivdu, recent tv endocarditis with mssa s/p 12 weeks tx  readmitted for ongoing fever, recurrent back pain, and new nausea/vomiting/diarrhea  I reviewed chart and spoke with patient/his girlfriend  Patient was initially admitted 11/2021 for tv mssa endocarditis. He underwent angiovac but residual veg burden remains on subvalvular side; he received weekly dalbavancin and discharged but then readmitted 5/24-6/08 for recurrent/persistent mssa bacteremia thought to be treatment failure. He was then placed on cefazolin and discharged to [facility] due to hx ivdu, to finish iv abx  He reports he felt well for about 2 weeks but then even before finishing iv cefazolin noted to have subjective fever. 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 No neurological deficit.  1-2 weeks prior to this admission started having n/v/diarrhea. The nausea/vomiting is getting worse and not able to hold down much food. Diarrhea is < 3 times a day and not present every day  No other sx otherwise, including rash Denies ongoing ivdu  Previously his other complication include also septic pulm emboli  He takes methadone for his opioid dependence  He uses [place] since age 40, reports once a week now. No hx cyclic vomiting    History reviewed. No pertinent family history.  Social History   Tobacco Use   Smoking status: Every Day    Packs/day: 1.00    Years: 19.00    Total pack years: 19.00    Types: Cigarettes   Smokeless  tobacco: Never   Tobacco comments:    Currently 5 cigs/day  Vaping Use   Vaping Use: Never used  Substance Use Topics   Alcohol use: No   Drug use: Yes    Types: Methamphetamines, Marijuana, Heroin    Comment: last use in April - meth; no heroin in 3 years; occasional marijuana    Allergies   Allergen Reactions   Penicillins Hives   Amoxil [Amoxicillin Trihydrate]     childhood    Review of Systems: ROS All Other ROS was negative, except mentioned above   Past Medical History:  Diagnosis Date   Anxiety    Endocarditis    Hepatitis    hx of hep C per pt   Opiate dependence ([name])    on methadone       Scheduled Meds:  docusate sodium  100 mg Oral BID   enoxaparin (LOVENOX) injection  40 mg Subcutaneous Q24H   methadone  135 mg Oral Daily   nicotine  14 mg Transdermal Daily   pantoprazole  40 mg Oral Daily   sodium chloride flush  3 mL Intravenous Q12H   Continuous Infusions:   ceFAZolin (ANCEF) IV Stopped (02/26/22 0402)   PRN Meds:.acetaminophen, ALPRAZolam, bisacodyl, hydrALAZINE, ibuprofen, methocarbamol, polyethylene glycol   OBJECTIVE: Blood pressure 100/68, pulse (!) 110, temperature (!) 97.5 F (36.4 C), temperature source Oral, resp. rate 17, height 5\' 10"  (1.778 m), weight 74 kg, SpO2 99 %.  Physical Exam  General/constitutional: no distress, pleasant HEENT: Normocephalic, PER, Conj Clear, EOMI, Oropharynx clear; poor dentition Neck supple CV: rrr no mrg Lungs: clear to auscultation, normal respiratory effort Abd: Soft, Nontender Ext: no edema Skin: No Rash Neuro: nonfocal MSK: no peripheral joint swelling/tenderness/warmth; back spines nontender   Central line presence: no   Lab Results Lab Results  Component Value Date   WBC 9.9 02/26/2022   HGB 9.3 (L) 02/26/2022   HCT 27.8 (L) 02/26/2022   MCV 82.2 02/26/2022   PLT 112 (L) 02/26/2022    Lab Results  Component Value Date   CREATININE 0.95 02/26/2022   BUN 10 02/26/2022   NA 131 (L) 02/26/2022   K 2.9 (L) 02/26/2022   CL 95 (L) 02/26/2022   CO2 29 02/26/2022    Lab Results  Component Value Date   ALT 12 02/25/2022   AST 24 02/25/2022   GGT 27 12/24/2021   ALKPHOS 56 02/25/2022   BILITOT 1.1 02/25/2022      Microbiology: Recent Results (from the past 240  hour(s))  Culture, blood (Routine x 2)     Status: None (Preliminary result)   Collection Time: 02/24/22  6:41 PM   Specimen: BLOOD  Result Value Ref Range Status   Specimen Description BLOOD SITE NOT SPECIFIED  Final   Special Requests   Final    BOTTLES DRAWN AEROBIC ONLY Blood Culture results may not be optimal due to an inadequate volume of blood received in culture bottles   Culture  Setup Time   Final    GRAM POSITIVE COCCI IN CLUSTERS Organism ID to follow CRITICAL RESULT CALLED TO, READ BACK BY AND VERIFIED WITH: PHARMD AUSTIN PAYTES [id number]  1010 AM BY EC CRITICAL RESULT CALLED TO, READ BACK BY AND VERIFIED WITH: ANNIE OLEARY CHARGE NURSE 02/26/22 1025 AM BY EC PATIENT DISCHARGED    Culture   Final    CULTURE REINCUBATED FOR BETTER GROWTH Performed at [facility], [address] [place], [place]    Report  Status PENDING  Incomplete  Blood Culture ID Panel (Reflexed)     Status: None   Collection Time: 02/24/22  6:41 PM  Result Value Ref Range Status   Enterococcus faecalis NOT DETECTED NOT DETECTED Final   Enterococcus Faecium NOT DETECTED NOT DETECTED Final   Listeria monocytogenes NOT DETECTED NOT DETECTED Final   Staphylococcus species NOT DETECTED NOT DETECTED Final   Staphylococcus aureus (BCID) NOT DETECTED NOT DETECTED Final   Staphylococcus epidermidis NOT DETECTED NOT DETECTED Final   Staphylococcus lugdunensis NOT DETECTED NOT DETECTED Final   Streptococcus species NOT DETECTED NOT DETECTED Final   Streptococcus agalactiae NOT DETECTED NOT DETECTED Final   Streptococcus pneumoniae NOT DETECTED NOT DETECTED Final   Streptococcus pyogenes NOT DETECTED NOT DETECTED Final   A.calcoaceticus-baumannii NOT DETECTED NOT DETECTED Final   Bacteroides fragilis NOT DETECTED NOT DETECTED Final   Enterobacterales NOT DETECTED NOT DETECTED Final   Enterobacter cloacae complex NOT DETECTED NOT DETECTED Final   Escherichia coli NOT DETECTED NOT DETECTED Final    Klebsiella aerogenes NOT DETECTED NOT DETECTED Final   Klebsiella oxytoca NOT DETECTED NOT DETECTED Final   Klebsiella pneumoniae NOT DETECTED NOT DETECTED Final   Proteus species NOT DETECTED NOT DETECTED Final   Salmonella species NOT DETECTED NOT DETECTED Final   Serratia marcescens NOT DETECTED NOT DETECTED Final   Haemophilus influenzae NOT DETECTED NOT DETECTED Final   Neisseria meningitidis NOT DETECTED NOT DETECTED Final   Pseudomonas aeruginosa NOT DETECTED NOT DETECTED Final   Stenotrophomonas maltophilia NOT DETECTED NOT DETECTED Final   Candida albicans NOT DETECTED NOT DETECTED Final   Candida auris NOT DETECTED NOT DETECTED Final   Candida glabrata NOT DETECTED NOT DETECTED Final   Candida krusei NOT DETECTED NOT DETECTED Final   Candida parapsilosis NOT DETECTED NOT DETECTED Final   Candida tropicalis NOT DETECTED NOT DETECTED Final   Cryptococcus neoformans/gattii NOT DETECTED NOT DETECTED Final    Comment: Performed at [facility], [address] [place], [place] [address]  Culture, blood (Routine X 2) w Reflex to ID Panel     Status: None (Preliminary result)   Collection Time: 02/25/22  6:21 PM   Specimen: BLOOD RIGHT HAND  Result Value Ref Range Status   Specimen Description BLOOD RIGHT HAND  Final   Special Requests   Final    BOTTLES DRAWN AEROBIC AND ANAEROBIC Blood Culture results may not be optimal due to an inadequate volume of blood received in culture bottles   Culture  Setup Time   Final    GRAM POSITIVE COCCI IN CLUSTERS IN BOTH AEROBIC AND ANAEROBIC BOTTLES CRITICAL VALUE NOTED.  VALUE IS CONSISTENT WITH PREVIOUSLY REPORTED AND CALLED VALUE. Performed at [facility], [address], [place], [place] [address]    Culture [facility] POSITIVE COCCI  Final   Report Status PENDING  Incomplete     Serology:    Imaging: If present, new imagings (plain films, ct scans, and mri) have been personally visualized and interpreted; radiology  reports have been reviewed. Decision making incorporated into the Impression / Recommendations.  7/24 cxr IMPRESSION: Nodular opacity within the right lung base, unchanged from yesterday's chest radiograph. A cavitary nodule concerning for pulmonary infection was present at this site on the prior chest CT of 12/26/2021. The nodular opacity present on today's examination may reflect persistent pneumonia or a focus of nodular scarring.   Trace right pleural effusion.   Bandlike and linear foci of scarring within the right mid  to lower lung field.    -------- 5/25 thoracolumbar mri 1. Minimal marrow edema about the left aspect of the T10-11 endplates, early spinal infection is possible given absence on comparison from last month. No spinal canal collection. 2. Negative lumbar spine.    5/25 tte  1. Left ventricular ejection fraction, by estimation, is 60 to 65%. The  left ventricle has normal function. The left ventricle has no regional  wall motion abnormalities. Left ventricular diastolic parameters were  normal.   2. Right ventricular systolic function is normal. The right ventricular  size is normal. There is normal pulmonary artery systolic pressure. The  estimated right ventricular systolic pressure is 33.0 mmHg.   3. The mitral valve is normal in structure. No evidence of mitral valve  regurgitation. No evidence of mitral stenosis.   4. There is a 1.4 x 1.8 cm moible mass associated with the septal  tricuspid leaflet, consistent with vegetation. The tricuspid valve is  abnormal. Tricuspid valve regurgitation is mild to moderate.   5. The aortic valve is tricuspid. Aortic valve regurgitation is not  visualized. No aortic stenosis is present.   6. The inferior vena cava is normal in size with greater than 50%  respiratory variability, suggesting right atrial pressure of 3 mmHg.   Comparison(s): Changes from prior study are noted. 11/28/2021: TEE  -demonstrated TV  vegetations on the septal and anterior leaflets- the  septal leaflet vegetation appears to have increased in size.     5/24 chest cta 1. Cavitary nodule with fluid and gas in the RIGHT lower lobe is concerning for pulmonary infection. 2. Additional scattered pulmonary nodules in the LEFT and RIGHT lung are concerning for early pulmonary infections. 3. Similar pattern of pulmonary nodules in the lower lobes on CT abdomen 11/11/2021. 4. No pulmonary embolism.  Heart appears normal.   4/21 tee  1. The tricuspid valve is abnormal; there are vegetations on all  tricuspid leaflets. Largest in 2.0 cm X 0.6 cm on the anterior leaflet.  0.86 X 0.55 cm vegetation on the septal leaflet. 0.58 cm X 0.67 cm  vegetation on the posterior leaflet. Tricuspid  valve regurgitation is severe and eccentric.   2. Left ventricular ejection fraction, by estimation, is 60 to 65%. The  left ventricle has normal function.   3. Right ventricular systolic function is normal. The right ventricular  size is normal.   4. No left atrial/left atrial appendage thrombus was detected.   5. The mitral valve is grossly normal, with bileaflet billowing but no  vegetation. No evidence of significant mitral valve regurgitation. No  evidence of mitral stenosis.   6. The aortic valve is tricuspid. Aortic valve regurgitation is not  visualized. No aortic stenosis is present.   Conclusion(s)/Recommendation(s): Findings are concerning for  vegetation/infective endocarditis as detailed above.   Raymondo Band, MD [facility] [phone] pager    02/26/2022, 10:22 AM

## 2022-02-27 ENCOUNTER — Ambulatory Visit: Payer: BLUE CROSS/BLUE SHIELD | Admitting: Infectious Diseases

## 2022-02-27 DIAGNOSIS — E876 Hypokalemia: Secondary | ICD-10-CM

## 2022-02-27 DIAGNOSIS — B182 Chronic viral hepatitis C: Secondary | ICD-10-CM | POA: Diagnosis not present

## 2022-02-27 DIAGNOSIS — E871 Hypo-osmolality and hyponatremia: Secondary | ICD-10-CM

## 2022-02-27 DIAGNOSIS — I079 Rheumatic tricuspid valve disease, unspecified: Secondary | ICD-10-CM | POA: Diagnosis not present

## 2022-02-27 DIAGNOSIS — I330 Acute and subacute infective endocarditis: Secondary | ICD-10-CM

## 2022-02-27 DIAGNOSIS — M546 Pain in thoracic spine: Secondary | ICD-10-CM | POA: Diagnosis not present

## 2022-02-27 DIAGNOSIS — G894 Chronic pain syndrome: Secondary | ICD-10-CM | POA: Diagnosis not present

## 2022-02-27 DIAGNOSIS — F419 Anxiety disorder, unspecified: Secondary | ICD-10-CM | POA: Diagnosis not present

## 2022-02-27 DIAGNOSIS — A4101 Sepsis due to Methicillin susceptible Staphylococcus aureus: Secondary | ICD-10-CM

## 2022-02-27 LAB — CBC
HCT: 26 % — ABNORMAL LOW (ref 39.0–52.0)
Hemoglobin: 8.8 g/dL — ABNORMAL LOW (ref 13.0–17.0)
MCH: 27.2 pg (ref 26.0–34.0)
MCHC: 33.8 g/dL (ref 30.0–36.0)
MCV: 80.5 fL (ref 80.0–100.0)
Platelets: 115 10*3/uL — ABNORMAL LOW (ref 150–400)
RBC: 3.23 MIL/uL — ABNORMAL LOW (ref 4.22–5.81)
RDW: 15.2 % (ref 11.5–15.5)
WBC: 7.2 10*3/uL (ref 4.0–10.5)
nRBC: 0 % (ref 0.0–0.2)

## 2022-02-27 LAB — BASIC METABOLIC PANEL
Anion gap: 8 (ref 5–15)
BUN: 7 mg/dL (ref 6–20)
CO2: 29 mmol/L (ref 22–32)
Calcium: 7.9 mg/dL — ABNORMAL LOW (ref 8.9–10.3)
Chloride: 96 mmol/L — ABNORMAL LOW (ref 98–111)
Creatinine, Ser: 0.72 mg/dL (ref 0.61–1.24)
GFR, Estimated: >60 mL/min (ref >60)
Glucose, Bld: 104 mg/dL — ABNORMAL HIGH (ref 70–99)
Potassium: 3 mmol/L — ABNORMAL LOW (ref 3.5–5.1)
Sodium: 133 mmol/L — ABNORMAL LOW (ref 135–145)

## 2022-02-27 LAB — URINE CULTURE: Culture: NO GROWTH

## 2022-02-27 LAB — GLUCOSE, CAPILLARY: Glucose-Capillary: 129 mg/dL — ABNORMAL HIGH (ref 70–99)

## 2022-02-27 LAB — MAGNESIUM: Magnesium: 1.5 mg/dL — ABNORMAL LOW (ref 1.7–2.4)

## 2022-02-27 MED ORDER — MAGNESIUM SULFATE 2 GM/50ML IV SOLN
2.0000 g | Freq: Once | INTRAVENOUS | Status: AC
  Administered 2022-02-27: 2 g via INTRAVENOUS
  Filled 2022-02-27: qty 50

## 2022-02-27 MED ORDER — POTASSIUM CHLORIDE 20 MEQ PO PACK
60.0000 meq | PACK | Freq: Once | ORAL | Status: AC
  Administered 2022-02-27: 60 meq via ORAL
  Filled 2022-02-27: qty 3

## 2022-02-27 MED ORDER — SODIUM CHLORIDE 0.9 % IV BOLUS
500.0000 mL | Freq: Once | INTRAVENOUS | Status: AC | PRN
  Administered 2022-02-28: 500 mL via INTRAVENOUS

## 2022-02-27 NOTE — Assessment & Plan Note (Addendum)
No clinical bleeding, hemoglobin down from admission, suspect this is dilutional.

## 2022-02-27 NOTE — Progress Notes (Signed)
  Progress Note   Patient: Reginald Clarke DUK:[id number] DOB: 1990-10-06 DOA: 02/25/2022     2 DOS: the patient was seen and examined on 02/27/2022 at 11:08 AM      Brief hospital course: Mr. Kanouse is a 31 y.o. M with history IVDU, negative tricuspid valve endocarditis due to MSSA bacteremia with septic pulmonary emboli and thoracic vertebral osteomyelitis who presented with fevers, weakness.  Blood cultures again positive for MSSA.      Assessment and Plan: * Sepsis ([name])    MSSA bacteremia    Infective endocarditis of tricuspid valve Sepsis due to MSSA bacteremia with infective endocarditis of the tricuspid valve.  Echocardiogram this admission shows a persistent 1.8 cm tricuspid valve vegetation, and his CT chest shows ongoing embolic phenomena into the lungs.  Presumably this large vegetation is the cause of his antibiotics failures. - Continue cefazolin - Consult infectious disease, appreciate expertise - Consult CT surgery, for question if surgical treatment of his valve disease is possible, called to the office today  Hyponatremia - Avoid IV fluids for now  Hypomagnesemia - Supplement magnesium  Hypokalemia - Supplement potassium  Acute midline thoracic back pain MRI thoracic spine improved from previous  Prolonged QT interval  - Stop PPI  Normocytic anemia No clinical bleeding, hemoglobin down to 8.8 from 12.2 several days ago, suspect this is dilutional.  Anxiety - Continue Xanax for now  Hepatitis C, chronic ([name]) - Consult ID, appreciate recommendations  Polysubstance abuse ([name]) - Continue methadone  Tobacco abuse - Continue nicotine patch   Chronic pain syndrome/methadone abuse  - Continue methadone          Subjective: Patient having some right flank pain with deep inspiration, he has mild hemoptysis, he has frequent cough, he feels generally weak and tired, no fever in last 24 hours     Physical Exam: Vitals:   02/26/22 1949  02/27/22 0001 02/27/22 0623 02/27/22 0808  BP: (!) 105/57 93/60 (!) 94/49 101/62  Pulse: (!) 101 88 (!) 102 98  Resp: 17 17 18 17   Temp: (!) 101.8 F (38.8 C) 99.1 F (37.3 C) 99.6 F (37.6 C) 99.4 F (37.4 C)  TempSrc: Oral Oral Oral Oral  SpO2: 97% 98% 94% 97%  Weight:      Height:       Thin adult male, lying in bed, interactive, appropriate Tachycardic, he has what sounds like a gallop, does not exactly hear a diastolic murmur Lung sounds diminished throughout, I do not appreciate rales in the right base Respiratory effort appears normal, no wheezing Abdomen soft without tenderness palpation or guarding Attention normal, affect appropriate, judgment and insight appear normal  Data Reviewed: Metabolic panel shows hypokalemia, low magnesium, hyponatremia, normal renal function Hemogram shows worsening anemia, white blood cells improved CT of the chest shows bilateral opacities with cavitation worse in the right lower lobe, consistent with septic pulmonary emboli Echocardiogram report reviewed, shows normal EF, 1.8 cm mass in the tricuspid valve MRI of the thoracic spine shows no evidence of discitis or osteomyelitis     Disposition: Status is: Inpatient         Author: , MD 02/27/2022 2:57 PM  For on call review [email or web address].

## 2022-02-27 NOTE — Assessment & Plan Note (Addendum)
Continue nicotine patch  °

## 2022-02-27 NOTE — Assessment & Plan Note (Addendum)
QT interval progrsesively shorter last 2 days, now normal

## 2022-02-27 NOTE — Assessment & Plan Note (Addendum)
Discussed with patient, Xanax not acceptable long-term daily medication.  Will not d/c for now, as patient reports high anxiety during inpatient stay.  Now that QTc improved, will start SSRI.  Patient aware that Xanax taper likely during this hospitalization - Start escitalopram - Check ECG tomorrow for QTc  - Continue PRN Xanax for now - Continue PRN hydroxyzine - Trazodone at night

## 2022-02-27 NOTE — Assessment & Plan Note (Signed)
-   Consult ID, appreciate recommendations

## 2022-02-27 NOTE — Assessment & Plan Note (Addendum)
Stable

## 2022-02-27 NOTE — Assessment & Plan Note (Addendum)
Bcx 7/23: MRSA and MSSA both in 1/1 (patient left ER triage) Bcx 7/24: MRSA and MSSA both in 1/1 (collected on readmission) Bcx 7/26: No growth Bcx 7/28: NGTD  - Continue cefazolin and daptomycin  - Plan for 6 weeks IV antibiotics from negative 7/26 blood cultures, until 9/6 - Consult infectious disease, appreciate expertise - Place PICC today

## 2022-02-27 NOTE — Plan of Care (Signed)
  Problem: Pain Managment: Goal: General experience of comfort will improve Outcome: Progressing   

## 2022-02-27 NOTE — Hospital Course (Addendum)
Reginald Clarke is a 31 y.o. M w/ hx IVDU with MSSA bacteremia native tricuspid valve endocarditis starting Apr 2023 who is admitted for recurrent endocarditis.     4/8 Diagnosed with pneumonia, discharged from ED 4/12 Blood cultures from 4/8 growing MSSA, admitted to hospital 4/21 TEE showed large TV vegetation 4/26 Underwent angiovac by Dr. Cliffton Asters with residual subendocardial component 5/10 Discharged on oritavancin/dalbavancin   5/20-5/22: Had recurrent fevers despite adherence to orita/dalba appointments 5/22: Blood cultures obtained at outpatient ID appointment  5/24-6/8: Cultures from 5/22 growing MSSA again, readmitted; CT chest redemonstrates septic pulmonary emboli; MRI thoracic spine shows possible T11 discitis  6/8-7/5: Discharged from [facility] to [facility] to complete 6 weeks cefazolin  7/5: Completed cefazolin, discharged from [facility] 7/5 on oral cefdinir 7/19: Completed oral cefdinir, developed fevers again immediately after 7/23: Seen in ER, for fever, weakness, dehdyration blood cultures with bacteremia but patient LWBS 7/24: Returned to ER, in morning, admitted again

## 2022-02-27 NOTE — Assessment & Plan Note (Signed)
Continue methadone 

## 2022-02-27 NOTE — Significant Event (Signed)
Rapid Response Event Note   Reason for Call :  Temp 92.8  Initial Focused Assessment:  Patient laying in bed with his eyes shut, easily arouasable, covered in blankets. His skin is pale and clammy to the touch. Denies any complaints. Pt presently being treated for MSSA endocarditis.   VS: BP 84/59(67) HR 60 RR 16 Sats 99% on RA, Temp 92.8 (Rectal)  Interventions:  IVF Bolus  Plan of Care:  Keep covered in blankets and monitor patient's temperature closely until normalized Monitor BP after fluid bolus finished and as temperature normalizes   Event Summary:  Notify RRT if further assistance is needed.  May need bair hugger. RN to notify MD if any changes noted.   MD Notified: Dr. Garner Nash  Call Time: 2252  Arrival Time: 2300 End Time: 2329  Ronne Binning, RN  ADDENDUM @0215 : Called by MD to come assess patient at bedside. SBP 70's, Temp not staying up despite warm blankets and K pad. On my arrival, patient shivering in bed but complaints of being hot. Not in any obvious distress. BP BP 90/68(76) on right arm HR 68, Sats 100% on room air, Rectal temp 92.3. BP on L arm 86/75(80). Patient also has not peed tonight, bladder scan completed showed 75mL. Notified Dr. 20m who is consulting CCM.   Per Dr. Garner Nash, checking lactic acid now. Result with determine POC.

## 2022-02-27 NOTE — Progress Notes (Signed)
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 S/p initial cefazolin, then discharged with weekly dalbavancin #Persistent mssa bacteremia 12/22/21 readmitted; s/p weeks cefazolin by 7/05 with planned 2 more weeks cefadroxil which he was taking #n/v/diarrhea  #Chronic hep C #ivdu   ----------- 7/26 assessment His n/v/d doesn't appear to be cdiff/gastroenteritis. Query if related to marijuanna use with cyclic vomiting syndrome although appears to be his first episode. Resolved since HD#1. Will cancel cdiff testing  7/23 bcx mssa; 7/24 bcx gpc clusters. Tte showed persistent same size vegetation. Ct chest worsening bilateral R>L cavitary opacity. Mri t-spine no concerning changes and back pain much better since being here  Given 2nd failure, which I suspect is due to burden of tv vegetation, will see if CTS could review case and able to offer any further burden reduction      Plan: Repeat blood culture tomorrow Continue cefazolin Cancel cdiff pcr and enteric precaution Please discuss with CTS regarding case and mssa tv endocarditis tx failure Discussed with primary team   I spent more than 35 minute reviewing data/chart, and coordinating care and >50% direct face to face time providing counseling/discussing diagnostics/treatment plan with patient    Principal Problem:   Sepsis ([name]) Active Problems:   MSSA bacteremia   Chronic pain syndrome/methadone abuse    Tobacco abuse   Polysubstance abuse ([name])   Hepatitis C, chronic ([name])   Anxiety   Normocytic anemia   Prolonged QT interval   History of bacterial endocarditis   Acute midline thoracic  back pain   Hypokalemia   Hypomagnesemia   Hyponatremia   Allergies  Allergen Reactions   Penicillins Hives   Amoxil [Amoxicillin Trihydrate]     childhood    Scheduled Meds:  docusate sodium  100 mg Oral BID   enoxaparin (LOVENOX) injection  40 mg Subcutaneous Q24H   feeding supplement  237 mL Oral BID BM   methadone  135 mg Oral Daily   multivitamin with minerals  1 tablet Oral Daily   nicotine  14 mg Transdermal Daily   pantoprazole  40 mg Oral Daily   sodium chloride flush  3 mL Intravenous Q12H   Continuous Infusions:   ceFAZolin (ANCEF) IV 2 g (02/27/22 1610)   lactated ringers 75 mL/hr at 02/26/22 2307   magnesium sulfate bolus IVPB 2 g (02/27/22 1029)   PRN Meds:.acetaminophen, ALPRAZolam, bisacodyl, hydrALAZINE, ibuprofen, methocarbamol, polyethylene glycol   SUBJECTIVE: Back pain better Ongoing fever  Wbc improving Ct chest, mri t spine, and tte result reviewed with patient Bcx still positive  No new focal pain  Not hypoxic No further n/v/diarrhea (none since HD#1)  Review of Systems: ROS All other ROS was negative, except mentioned above     OBJECTIVE: Vitals:   02/26/22 1949 02/27/22 0001 02/27/22 0623 02/27/22 0808  BP: (!) 105/57 93/60 (!) 94/49 101/62  Pulse: (!) 101 88 (!)  102 98  Resp: 17 17 18 17   Temp: (!) 101.8 F (38.8 C) 99.1 F (37.3 C) 99.6 F (37.6 C) 99.4 F (37.4 C)  TempSrc: Oral Oral Oral Oral  SpO2: 97% 98% 94% 97%  Weight:      Height:       Body mass index is 23.41 kg/m.  Physical Exam General/constitutional: no distress, pleasant HEENT: Normocephalic, PER, Conj Clear, EOMI, Oropharynx clear; poor dentition Neck supple CV: rrr no mrg Lungs: clear to auscultation, normal respiratory effort Abd: Soft, Nontender Ext: no edema Skin: No Rash Neuro: nonfocal MSK: no peripheral joint swelling/tenderness/warmth; back spines nontender      Lab Results Lab Results  Component Value Date   WBC 7.2 02/27/2022    HGB 8.8 (L) 02/27/2022   HCT 26.0 (L) 02/27/2022   MCV 80.5 02/27/2022   PLT 115 (L) 02/27/2022    Lab Results  Component Value Date   CREATININE 0.72 02/27/2022   BUN 7 02/27/2022   NA 133 (L) 02/27/2022   K 3.0 (L) 02/27/2022   CL 96 (L) 02/27/2022   CO2 29 02/27/2022    Lab Results  Component Value Date   ALT 12 02/25/2022   AST 24 02/25/2022   GGT 27 12/24/2021   ALKPHOS 56 02/25/2022   BILITOT 1.1 02/25/2022      Microbiology: Recent Results (from the past 240 hour(s))  Culture, blood (Routine x 2)     Status: Abnormal (Preliminary result)   Collection Time: 02/24/22  6:41 PM   Specimen: BLOOD  Result Value Ref Range Status   Specimen Description BLOOD SITE NOT SPECIFIED  Final   Special Requests   Final    BOTTLES DRAWN AEROBIC ONLY Blood Culture results may not be optimal due to an inadequate volume of blood received in culture bottles   Culture  Setup Time   Final    GRAM POSITIVE COCCI IN CLUSTERS Organism ID to follow CRITICAL RESULT CALLED TO, READ BACK BY AND VERIFIED WITH: PHARMD AUSTIN PAYTES [id number]  1010 AM BY EC CRITICAL RESULT CALLED TO, READ BACK BY AND VERIFIED WITH: ANNIE OLEARY CHARGE NURSE [phone] AM BY EC PATIENT DISCHARGED    Culture (A)  Final    STAPHYLOCOCCUS AUREUS SUSCEPTIBILITIES TO FOLLOW Performed at [facility], [address] [place], [place]    Report Status PENDING  Incomplete  Blood Culture ID Panel (Reflexed)     Status: None   Collection Time: 02/24/22  6:41 PM  Result Value Ref Range Status   Enterococcus faecalis NOT DETECTED NOT DETECTED Final   Enterococcus Faecium NOT DETECTED NOT DETECTED Final   Listeria monocytogenes NOT DETECTED NOT DETECTED Final   Staphylococcus species NOT DETECTED NOT DETECTED Final   Staphylococcus aureus (BCID) NOT DETECTED NOT DETECTED Final   Staphylococcus epidermidis NOT DETECTED NOT DETECTED Final   Staphylococcus lugdunensis NOT DETECTED NOT DETECTED Final    Streptococcus species NOT DETECTED NOT DETECTED Final   Streptococcus agalactiae NOT DETECTED NOT DETECTED Final   Streptococcus pneumoniae NOT DETECTED NOT DETECTED Final   Streptococcus pyogenes NOT DETECTED NOT DETECTED Final   A.calcoaceticus-baumannii NOT DETECTED NOT DETECTED Final   Bacteroides fragilis NOT DETECTED NOT DETECTED Final   Enterobacterales NOT DETECTED NOT DETECTED Final   Enterobacter cloacae complex NOT DETECTED NOT DETECTED Final   Escherichia coli NOT DETECTED NOT DETECTED Final   Klebsiella aerogenes NOT DETECTED NOT DETECTED Final   Klebsiella oxytoca NOT DETECTED NOT DETECTED Final  Klebsiella pneumoniae NOT DETECTED NOT DETECTED Final   Proteus species NOT DETECTED NOT DETECTED Final   Salmonella species NOT DETECTED NOT DETECTED Final   Serratia marcescens NOT DETECTED NOT DETECTED Final   Haemophilus influenzae NOT DETECTED NOT DETECTED Final   Neisseria meningitidis NOT DETECTED NOT DETECTED Final   Pseudomonas aeruginosa NOT DETECTED NOT DETECTED Final   Stenotrophomonas maltophilia NOT DETECTED NOT DETECTED Final   Candida albicans NOT DETECTED NOT DETECTED Final   Candida auris NOT DETECTED NOT DETECTED Final   Candida glabrata NOT DETECTED NOT DETECTED Final   Candida krusei NOT DETECTED NOT DETECTED Final   Candida parapsilosis NOT DETECTED NOT DETECTED Final   Candida tropicalis NOT DETECTED NOT DETECTED Final   Cryptococcus neoformans/gattii NOT DETECTED NOT DETECTED Final    Comment: Performed at [facility], [address], [place], [place] [address]  Culture, blood (Routine X 2) w Reflex to ID Panel     Status: None (Preliminary result)   Collection Time: 02/25/22  6:21 PM   Specimen: BLOOD RIGHT HAND  Result Value Ref Range Status   Specimen Description BLOOD RIGHT HAND  Final   Special Requests   Final    BOTTLES DRAWN AEROBIC AND ANAEROBIC Blood Culture results may not be optimal due to an inadequate volume of blood received in  culture bottles   Culture  Setup Time   Final    GRAM POSITIVE COCCI IN CLUSTERS IN BOTH AEROBIC AND ANAEROBIC BOTTLES CRITICAL VALUE NOTED.  VALUE IS CONSISTENT WITH PREVIOUSLY REPORTED AND CALLED VALUE. Performed at [facility], [address] [place], [place] [address]    Culture [facility] POSITIVE COCCI  Final   Report Status PENDING  Incomplete     Serology:   Imaging: If present, new imagings (plain films, ct scans, and mri) have been personally visualized and interpreted; radiology reports have been reviewed. Decision making incorporated into the Impression / Recommendations.  7/25 tte  1. Left ventricular ejection fraction, by estimation, is 60 to 65%. The  left ventricle has normal function.   2. Right ventricular systolic function is normal. The right ventricular  size is normal. There is normal pulmonary artery systolic pressure.   3. No evidence of mitral valve regurgitation.   4. Persistent tricuspid leaflet mass 1.8 cm x 0.95 cm seen on the  anterior leaflet. Tricuspid valve regurgitation is mild to moderate.   5. Aortic valve regurgitation is not visualized.   7/25 chest ct 1. Development of multifocal airspace opacities and increasing cavitary nodules in both lungs compared with previous chest CT from 2 months ago. Findings are most consistent with multilobar pneumonia, and the cavitary nodularity suggests possible septic emboli as the etiology. Correlate clinically. Cavitary metastatic disease/lymphoma considered unlikely. CT follow-up recommended. 2. Mildly enlarged mediastinal lymph nodes, likely reactive. 3. Stable small right pleural effusion.  Small pericardial effusion. 4. Mild splenomegaly.  7/25 mri thoracic spine 1. No evidence for acute infection within the thoracic spine. 2. Multifocal infiltrates within the partially visualized lungs, right greater than left, likely multifocal pneumonia. Associated small layering right pleural  effusion.   Raymondo Band, MD [facility] [phone] pager    02/27/2022, 10:33 AM

## 2022-02-27 NOTE — Assessment & Plan Note (Addendum)
AKI ruled out.  Cr back to normal

## 2022-02-28 DIAGNOSIS — I330 Acute and subacute infective endocarditis: Secondary | ICD-10-CM | POA: Diagnosis not present

## 2022-02-28 DIAGNOSIS — Z7189 Other specified counseling: Secondary | ICD-10-CM | POA: Diagnosis not present

## 2022-02-28 DIAGNOSIS — F411 Generalized anxiety disorder: Secondary | ICD-10-CM | POA: Diagnosis not present

## 2022-02-28 DIAGNOSIS — M546 Pain in thoracic spine: Secondary | ICD-10-CM | POA: Diagnosis not present

## 2022-02-28 DIAGNOSIS — B9561 Methicillin susceptible Staphylococcus aureus infection as the cause of diseases classified elsewhere: Secondary | ICD-10-CM | POA: Diagnosis not present

## 2022-02-28 DIAGNOSIS — A4101 Sepsis due to Methicillin susceptible Staphylococcus aureus: Secondary | ICD-10-CM | POA: Diagnosis not present

## 2022-02-28 DIAGNOSIS — I079 Rheumatic tricuspid valve disease, unspecified: Secondary | ICD-10-CM | POA: Diagnosis not present

## 2022-02-28 DIAGNOSIS — B182 Chronic viral hepatitis C: Secondary | ICD-10-CM | POA: Diagnosis not present

## 2022-02-28 DIAGNOSIS — I2690 Septic pulmonary embolism without acute cor pulmonale: Secondary | ICD-10-CM | POA: Diagnosis not present

## 2022-02-28 DIAGNOSIS — R9431 Abnormal electrocardiogram [ECG] [EKG]: Secondary | ICD-10-CM | POA: Diagnosis not present

## 2022-02-28 DIAGNOSIS — B9562 Methicillin resistant Staphylococcus aureus infection as the cause of diseases classified elsewhere: Secondary | ICD-10-CM

## 2022-02-28 DIAGNOSIS — R7881 Bacteremia: Secondary | ICD-10-CM

## 2022-02-28 LAB — CBC
HCT: 30.7 % — ABNORMAL LOW (ref 39.0–52.0)
Hemoglobin: 10.1 g/dL — ABNORMAL LOW (ref 13.0–17.0)
MCH: 27.3 pg (ref 26.0–34.0)
MCHC: 32.9 g/dL (ref 30.0–36.0)
MCV: 83 fL (ref 80.0–100.0)
Platelets: 169 10*3/uL (ref 150–400)
RBC: 3.7 MIL/uL — ABNORMAL LOW (ref 4.22–5.81)
RDW: 15.3 % (ref 11.5–15.5)
WBC: 8.2 10*3/uL (ref 4.0–10.5)
nRBC: 0 % (ref 0.0–0.2)

## 2022-02-28 LAB — CULTURE, BLOOD (ROUTINE X 2)

## 2022-02-28 LAB — COMPREHENSIVE METABOLIC PANEL
ALT: 8 U/L (ref 0–44)
AST: 21 U/L (ref 15–41)
Albumin: 2.4 g/dL — ABNORMAL LOW (ref 3.5–5.0)
Alkaline Phosphatase: 52 U/L (ref 38–126)
Anion gap: 7 (ref 5–15)
BUN: 11 mg/dL (ref 6–20)
CO2: 28 mmol/L (ref 22–32)
Calcium: 8.3 mg/dL — ABNORMAL LOW (ref 8.9–10.3)
Chloride: 101 mmol/L (ref 98–111)
Creatinine, Ser: 1.32 mg/dL — ABNORMAL HIGH (ref 0.61–1.24)
GFR, Estimated: >60 mL/min (ref >60)
Glucose, Bld: 124 mg/dL — ABNORMAL HIGH (ref 70–99)
Potassium: 3.4 mmol/L — ABNORMAL LOW (ref 3.5–5.1)
Sodium: 136 mmol/L (ref 135–145)
Total Bilirubin: 0.5 mg/dL (ref 0.3–1.2)
Total Protein: 6.9 g/dL (ref 6.5–8.1)

## 2022-02-28 LAB — MAGNESIUM: Magnesium: 2.5 mg/dL — ABNORMAL HIGH (ref 1.7–2.4)

## 2022-02-28 LAB — LACTIC ACID, PLASMA
Lactic Acid, Venous: 1.9 mmol/L (ref 0.5–1.9)
Lactic Acid, Venous: 1.5 mmol/L (ref 0.5–1.9)

## 2022-02-28 MED ORDER — ALPRAZOLAM 0.5 MG PO TABS
0.2500 mg | ORAL_TABLET | Freq: Every evening | ORAL | Status: DC | PRN
  Administered 2022-03-01 – 2022-03-08 (×8): 0.25 mg via ORAL
  Filled 2022-02-28 (×7): qty 1

## 2022-02-28 MED ORDER — ALPRAZOLAM 0.5 MG PO TABS
0.5000 mg | ORAL_TABLET | Freq: Once | ORAL | Status: AC | PRN
Start: 2022-02-28 — End: 2022-02-28
  Administered 2022-02-28: 0.5 mg via ORAL
  Filled 2022-02-28: qty 1

## 2022-02-28 MED ORDER — LACTATED RINGERS IV SOLN: INTRAVENOUS | Status: AC

## 2022-02-28 MED ORDER — POTASSIUM CHLORIDE 10 MEQ/100ML IV SOLN
10.0000 meq | INTRAVENOUS | Status: AC
  Administered 2022-02-28 (×2): 10 meq via INTRAVENOUS
  Filled 2022-02-28 (×2): qty 100

## 2022-02-28 MED ORDER — TRAZODONE HCL 50 MG PO TABS
50.0000 mg | ORAL_TABLET | Freq: Every day | ORAL | Status: DC
Start: 2022-02-28 — End: 2022-02-28

## 2022-02-28 MED ORDER — ALPRAZOLAM 0.25 MG PO TABS
0.2500 mg | ORAL_TABLET | Freq: Two times a day (BID) | ORAL | Status: DC | PRN
  Administered 2022-03-01 – 2022-04-22 (×101): 0.25 mg via ORAL
  Filled 2022-02-28 (×102): qty 1

## 2022-02-28 MED ORDER — TRAZODONE HCL 50 MG PO TABS
25.0000 mg | ORAL_TABLET | Freq: Every day | ORAL | Status: DC
  Administered 2022-02-28 – 2022-03-09 (×10): 25 mg via ORAL
  Filled 2022-02-28 (×10): qty 1

## 2022-02-28 MED ORDER — SODIUM CHLORIDE 0.9 % IV SOLN
8.0000 mg/kg | Freq: Every day | INTRAVENOUS | Status: DC
  Administered 2022-02-28 – 2022-03-08 (×9): 600 mg via INTRAVENOUS
  Filled 2022-02-28 (×10): qty 12

## 2022-02-28 MED ORDER — SODIUM CHLORIDE 0.9 % IV BOLUS: 500.0000 mL | Freq: Once | INTRAVENOUS | Status: DC | PRN

## 2022-02-28 NOTE — Progress Notes (Signed)
Pt is hypothermic , rectal temp check of 92 and hypotensive readings RRT called and on NP informed see new orders.

## 2022-02-28 NOTE — Consult Note (Signed)
Consultation Note Date: 02/28/2022   Patient Name: Reginald Clarke  DOB: 29-May-1991  MRN: [id number]  Age / Sex: 31 y.o., male  PCP: Allwardt, [name], PA-C Referring Physician: Alberteen Sam, *  Reason for Consultation:  tricuspid endocarditis, poor prognosis  HPI/Patient Profile: 31 y.o. 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 Blood cultures positive now for recurrent MSSA and new MRSA- likely related to PICC line he had at [facility].    Primary Decision Maker PATIENT  Discussion: I have reviewed medical records including Care Everywhere, progress notes from this and prior admissions, labs and imaging, discussed with RN.  On evaluation patient is awake alert and oriented.  His girlfriend, [name] is at the bedside.  I introduced Palliative Medicine as specialized medical care for people living with serious illness. It focuses on providing relief from the symptoms and stress of a serious illness. The goal is to improve quality of life for both the patient and the family.  We discussed a brief life review of the patient.  He openly shared his history of trauma as a child and early introduction to substance abuse.  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  This has been a great help to him.  He has 2 children.  He lives with his grandmother.  As far as functional and nutritional status-   prior to  this admission he was living independently.  Has had decrease in his appetite and weight loss.   We discussed patient's current illness and what it means in the larger context of patient's on-going co-morbidities.  Natural disease trajectory and risks of further complications were discussed.  I attempted to elicit values and goals of care important to the patient.  Being independent, able to interact with his family, and improving to a point where he can assume some care for his daughter are his primary goals of care.  Advance directives, concepts specific to code status, artificial feeding and hydration, and rehospitalization were considered and discussed.  If you are unable to make his own medical decisions he would like for his grandmother and his girlfriend [name] to be his surrogate decision maker to gather.  We discussed healthcare power of attorney and he is in agreement with chaplain consult to complete this.  We also discussed advanced directives.  If he was to end up on artificial life support he would not want it prolonged if it was determined he had a terminal illness.  For now he does desire full aggressive care.  We discussed resuscitation and he does want  CPR in the event that his heart stops and he stops breathing.  Discussed with patient/family the importance of continued conversation with family and the medical providers regarding overall plan of care and treatment options, ensuring decisions are within the context of the patient's values and GOCs.    We discussed aspects of suffering.  He acknowledges significant anxiety.  He has difficulty sleeping.  He gets very antsy, he cannot tolerate other people being very close to him, or hovering over him, or coming directly towards him.  His girlfriend notes that he will chew his fingers until they bleed.  We discussed the possibility that he also has some PTSD from his traumatic childhood.  He would likely benefit from a daily SSRI to start.   He has taken Wellbutrin in the past and this made him more anxious.  He also has tried Remeron and he did not have any improvement in his anxiety with this.  He has QT prolongation and so he cannot take Lexapro.  Discussed trialing trazodone.  He is in agreement with this we will start low-dose with 25 mg p.o. at night.  This will likely need to be titrated up.  I will also add an additional 0.25 mg Xanax as needed at night to help with his sleep.  Prior to admission he was taken 0.5 mg twice a day.  This was decreased to 0.25 mg due to hypotension from his sepsis.  His blood pressure is stabilizing now.  Questions and concerns were addressed. The family was encouraged to call with questions or concerns.    SUMMARY OF RECOMMENDATIONS -Spiritual care consult for healthcare power of attorney and advanced directives -Trazodone 25 mg nightly for generalized anxiety disorder -Continue Xanax 0.25 mg twice daily prn for acute anxiety attacks, will add one additional night time dose if he is unable to sleep despite trazadone -Continue full code, full scope -He is very clear he does not want to discharge again to an LTAC this was a terrible experience for him his hope is to discharge home -PMT will continue to follow -He inquired about starting treatment for his hepatitis C noting it was going to be started outpatient however he has not been out of acute or LTAC care long enough to see infectious disease outpatient will defer management of chronic hepatitis C to them   Code Status/Advance Care Planning: Full code   Prognosis:   Unable to determine  Discharge Planning: To Be Determined  Primary Diagnoses: Present on Admission:  Tobacco abuse  Prolonged QT interval  Polysubstance abuse ([name])  (Resolved) Methadone dependence ([name])  Hepatitis C, chronic ([name])  Chronic pain syndrome/methadone abuse   Anxiety  Normocytic anemia  Elevated serum creatinine   Review of Systems  Constitutional:   Positive for appetite change, fatigue and unexpected weight change.  Psychiatric/Behavioral:  Positive for sleep disturbance. Negative for suicidal ideas. The patient is nervous/anxious.     Physical Exam Vitals and nursing note reviewed.  Constitutional:      General: He is not in acute distress.    Appearance: Normal appearance. He is not ill-appearing.  HENT:     Head: Normocephalic and atraumatic.     Mouth/Throat:     Comments: Poor dentition Eyes:     General: No scleral icterus. Cardiovascular:     Rate and Rhythm: Normal rate.  Pulmonary:     Effort: Pulmonary effort is normal.  Abdominal:     General: Abdomen is flat.  Skin:  General: Skin is warm and dry.  Neurological:     General: No focal deficit present.     Mental Status: He is oriented to person, place, and time.  Psychiatric:        Mood and Affect: Mood normal.        Behavior: Behavior normal.        Thought Content: Thought content normal.        Judgment: Judgment normal.     Vital Signs: BP 112/73 (BP Location: Right Arm)   Pulse 96   Temp 97.9 F (36.6 C) (Oral)   Resp 19   Ht 5\' 10"  (1.778 m)   Wt 74 kg   SpO2 100%   BMI 23.41 kg/m  Pain Scale: 0-10   Pain Score: 0-No pain   SpO2: SpO2: 100 % O2 Device:SpO2: 100 % O2 Flow Rate: .   IO: Intake/output summary:  Intake/Output Summary (Last 24 hours) at 02/28/2022 1627 Last data filed at 02/28/2022 1524 Gross per 24 hour  Intake 2115.38 ml  Output --  Net 2115.38 ml    LBM: Last BM Date : 02/28/22 (per pt) Baseline Weight: Weight: 74 kg Most recent weight: Weight: 74 kg       Thank you for this consult. Palliative medicine will continue to follow and assist as needed.  Time Total: 120 minutes Greater than 50%  of this time was spent counseling and coordinating care related to the above assessment and plan.  Signed by: 03/02/22, AGNP-C Palliative Medicine    Please contact Palliative Medicine Team phone at [phone] for  questions and concerns.  For individual provider: See [phone]

## 2022-02-28 NOTE — Progress Notes (Signed)
     [address]       [name] [address]             [phone]       Images and chart reviewed.  31 yo male with hx of IV drug abuse previously admitted and treated for MSSA tricuspid valve endocarditis with angiovac.  He represents with septic pulmonary emboli, and persistent vegetation.  On review of his echo, the vegetation is mostly in the subvalvular apparatus, and thus not accessible via angiovac.  He only has mild to mod regurgitation, thus medical therapy and continued abx are recommended.  He can be evaluated for surgery as an outpt after he completes a drug rehab program, and has negative serial drug screens.  Reginald Clarke Scrape

## 2022-02-28 NOTE — Progress Notes (Addendum)
  Progress Note   Patient: Reginald Clarke NWG:[id number] DOB: 1991/06/26 DOA: 02/25/2022     3 DOS: the patient was seen and examined on 02/28/2022 at 8:50AM      Brief hospital course: Mr. Decola is a 31 y.o. M with history IVDU, negative tricuspid valve endocarditis due to MSSA bacteremia with septic pulmonary emboli and thoracic vertebral osteomyelitis who presented with fevers, weakness.  Blood cultures again positive for MSSA.        Assessment and Plan: * Sepsis ([name]) MSSA and MRSA bacteremia Infective endocarditis of tricuspid valve BP soft overnight, hypothermic.  Asymptomatic.  No new joint pains.   Cultures now growing both MSSA and MRSA - Continue cefazolin - Consult ID - Start Dapto - Consult CT surgery  Hyponatremia Stable  Elevated serum creatinine Cr up to 1.3.  Only 1 day.   - Give fluids and monitor - Hold NSAIDs  Prolonged QT interval - Monitor tele while on methadone  Anxiety - Continue Xanax for now  Polysubstance abuse ([name]) - Continue methadone           Subjective: Patient tired overnight, felt cold and hot alternating, no new joint pains, no respiratory distress, no change in cough, no new hemoptysis or confusion.     Physical Exam: Vitals:   02/28/22 0449 02/28/22 0622 02/28/22 0736 02/28/22 0850  BP: (!) 94/55 (P) 93/61 102/72 (!) 86/53  Pulse: 73 (P) 79 88 84  Resp: 16 (P) 16 18 18   Temp: (!) 94 F (34.4 C) (!) (P) 94.2 F (34.6 C) 97.6 F (36.4 C) (!) 95.8 F (35.4 C)  TempSrc: Rectal (P) Rectal Oral Rectal  SpO2:   100%   Weight:      Height:       Thin adult male, lying in bed, no acute distress Heart rate normal, did not appreciate murmurs today, no peripheral edema Respiratory rate normal, lungs overall diminished Abdomen soft no tenderness palpation or guarding Attention normal, affect appropriate, judgment and insight appear normal  Data Reviewed: Metabolic panel notable for creatinine slightly up, potassium  up little bit, magnesium normal, mild hyponatremia LFTs within normal limits Hemogram shows normal white count, hemoglobin no change    Family Communication: Girlfriend at the bedside    Disposition: Status is: Inpatient         Author: , MD 02/28/2022 9:48 AM  For on call review [email or web address].

## 2022-02-28 NOTE — Progress Notes (Signed)
Pt admitted to rm 4E from 6noth. Initiated tele. VSS. Oriented pt to the unit. Call bell within reach.   Lawson Radar, RN

## 2022-02-28 NOTE — Progress Notes (Addendum)
    OVERNIGHT PROGRESS REPORT  (Remote floor coverage) Notified by RN for continued low BP and low Temp. Warming blanket ordered, fluid bolus ordered.  CMP,CBC, Lactic Acid ordered. Sought recommendation of CCM,  Thanks to them for the response to bedside visit request.  Patient to remain in room for now.  Labs pending. =================================================== Update:  Latest Reference Range & Units 02/28/22 03:01  COMPREHENSIVE METABOLIC PANEL  Rpt !  Sodium 135 - 145 mmol/L 136  Potassium 3.5 - 5.1 mmol/L 3.4 (L)  Chloride 98 - 111 mmol/L 101  CO2 22 - 32 mmol/L 28  Glucose 70 - 99 mg/dL 462 (H)  BUN 6 - 20 mg/dL 11  Creatinine 7.03 - 5.00 mg/dL 9.38 (H)  Calcium 8.9 - 10.3 mg/dL 8.3 (L)  Anion gap 5 - 15  7  Alkaline Phosphatase 38 - 126 U/L 52  Albumin 3.5 - 5.0 g/dL 2.4 (L)  AST 15 - 41 U/L 21  ALT 0 - 44 U/L 8  Total Protein 6.5 - 8.1 g/dL 6.9  !: Data is abnormal (L): Data is abnormally low (H): Data is abnormally high Rpt: View report in Results Review for more information    Latest Reference Range & Units 02/28/22 03:01  Lactic Acid, Venous 0.5 - 1.9 mmol/L 1.9   Potassium replacement ordered. IV fluid ordered. Bair Hugger in use    Magnesium level pending.     Chinita Greenland MSNA MSN ACNPC-AG Acute Care Nurse Practitioner [facility]

## 2022-02-28 NOTE — Progress Notes (Signed)
Mobility Specialist Progress Note    02/28/22 1646  Mobility  Activity Refused mobility   Pt wanting to rest. Encouraged mobility. Will f/u as schedule permits.   [place] Mobility Specialist

## 2022-02-28 NOTE — Progress Notes (Signed)
Bare hugger in use to help with temp

## 2022-02-28 NOTE — Plan of Care (Signed)
?  Problem: Education: ?Goal: Knowledge of General Education information will improve ?Description: Including pain rating scale, medication(s)/side effects and non-pharmacologic comfort measures ?Outcome: Progressing ?  ?Problem: Health Behavior/Discharge Planning: ?Goal: Ability to manage health-related needs will improve ?Outcome: Progressing ?  ?Problem: Clinical Measurements: ?Goal: Will remain free from infection ?Outcome: Progressing ?  ?Problem: Clinical Measurements: ?Goal: Diagnostic test results will improve ?Outcome: Progressing ?  ?Problem: Clinical Measurements: ?Goal: Respiratory complications will improve ?Outcome: Progressing ?  ?Problem: Clinical Measurements: ?Goal: Cardiovascular complication will be avoided ?Outcome: Progressing ?  ?

## 2022-02-28 NOTE — Progress Notes (Signed)
Pt is still hypotensive after 500cc bolus  and was sweating profusely, blankets removed tem still 92.8 rectally . he is still oriented oriented *4. I am concerned about septic shock . k pad in place. He is a mews of 4 .would benefit from a transfer to another unit for closer monitoring. On call NP informed .

## 2022-02-28 NOTE — Progress Notes (Signed)
Pt transferred to 4E06

## 2022-02-28 NOTE — Progress Notes (Signed)
Hey so the patient Doc from [facility] saw patient and says pt can stay on the unit, and bare hugger to be  used and to continue to monitor and if lactic acid comes back abnormal they will consider transfer out . The patient doesn't have any fluids for maintenance and no urine output bladder scan show 35ml.   consider IVF and bp still low at 87/53. NP informed see new orders.

## 2022-02-28 NOTE — Progress Notes (Signed)
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 Spoke and confirmed with microbiology lab Suspect he has mrsa infection near end of [facility] stay via picc line infection. This would explain his history  Spoke with CTS dr Cliffton Asters again, tte finding cotinues to show sub-valvular vegetation not amenable to angiovac  Will continue abx for now hope can sterilize bacteremia        Plan: Repeat bcx Continue cefazolin due to superiority for mssa bacteremia over dapto/vancomycin Add daptomycin for mrsa coverage Discussed with [facility]TS team and primary team    I spent 50 minute reviewing data/chart, and coordinating care and >50% direct face to face time providing counseling/discussing diagnostics/treatment plan with patient   Principal Problem:   Sepsis ([name]) Active Problems:   MSSA bacteremia   Chronic pain syndrome/methadone abuse    Tobacco abuse   Polysubstance abuse ([name])   Hepatitis C, chronic ([name])   Anxiety   Normocytic anemia   Prolonged QT interval   Elevated serum creatinine   Hypokalemia    Hypomagnesemia   Hyponatremia   Infective endocarditis of tricuspid valve   Allergies  Allergen Reactions   Penicillins Hives   Amoxil [Amoxicillin Trihydrate]     childhood    Scheduled Meds:  docusate sodium  100 mg Oral BID   enoxaparin (LOVENOX) injection  40 mg Subcutaneous Q24H   feeding supplement  237 mL Oral BID BM   methadone  135 mg Oral Daily   multivitamin with minerals  1 tablet Oral Daily   nicotine  14 mg Transdermal Daily   sodium chloride flush  3 mL Intravenous Q12H   Continuous Infusions:   ceFAZolin (ANCEF) IV 2 g (02/28/22 1148)   DAPTOmycin (CUBICIN) 600 mg in sodium chloride 0.9 % IVPB     sodium chloride     PRN Meds:.acetaminophen, ALPRAZolam, bisacodyl, methocarbamol, polyethylene glycol, sodium chloride   SUBJECTIVE: Hypothermic last 24 hours Hds  Bcx mrsa and mssa on 7/23 and likely 7/24 (2 morphologies seen on plates)  Cough improving Not hypoxic  Review of Systems: ROS All other ROS was negative, except mentioned above     OBJECTIVE: Vitals:   02/28/22 0736 02/28/22 0850 02/28/22 1010 02/28/22 1100  BP: 102/72 (!) 86/53 (!) 118/105 112/73  Pulse: 88 84 100 96  Resp: 18 18 17 19   Temp: 97.6 F (36.4 C) (!) 95.8 F (35.4 C) (!)  97.4 F (36.3 C) 97.9 F (36.6 C)  TempSrc: Oral Rectal Oral Oral  SpO2: 100%  100% 100%  Weight:      Height:       Body mass index is 23.41 kg/m.  Physical Exam General/constitutional: no distress, pleasant HEENT: Normocephalic, PER, Conj Clear, EOMI, Oropharynx clear Neck supple CV: rrr no mrg Lungs: clear to auscultation, normal respiratory effort Abd: Soft, Nontender Ext: no edema Skin: No Rash Neuro: nonfocal MSK: no peripheral joint swelling/tenderness/warmth; back spines nontender         Lab Results Lab Results  Component Value Date   WBC 8.2 02/28/2022   HGB 10.1 (L) 02/28/2022   HCT 30.7 (L) 02/28/2022   MCV 83.0 02/28/2022   PLT 169 02/28/2022    Lab Results   Component Value Date   CREATININE 1.32 (H) 02/28/2022   BUN 11 02/28/2022   NA 136 02/28/2022   K 3.4 (L) 02/28/2022   CL 101 02/28/2022   CO2 28 02/28/2022    Lab Results  Component Value Date   ALT 8 02/28/2022   AST 21 02/28/2022   GGT 27 12/24/2021   ALKPHOS 52 02/28/2022   BILITOT 0.5 02/28/2022      Microbiology: Recent Results (from the past 240 hour(s))  Culture, blood (Routine x 2)     Status: Abnormal   Collection Time: 02/24/22  6:41 PM   Specimen: BLOOD  Result Value Ref Range Status   Specimen Description BLOOD SITE NOT SPECIFIED  Final   Special Requests   Final    BOTTLES DRAWN AEROBIC ONLY Blood Culture results may not be optimal due to an inadequate volume of blood received in culture bottles   Culture  Setup Time   Final    GRAM POSITIVE COCCI IN CLUSTERS Organism ID to follow CRITICAL RESULT CALLED TO, READ BACK BY AND VERIFIED WITH: PHARMD AUSTIN PAYTES [id number]  1010 AM BY EC CRITICAL RESULT CALLED TO, READ BACK BY AND VERIFIED WITH: Santo Held CHARGE NURSE [id number] 1025 AM BY EC PATIENT DISCHARGED Performed at [facility], [address], [place], [place] [address]    Culture (A)  Final    STAPHYLOCOCCUS AUREUS METHICILLIN RESISTANT STAPHYLOCOCCUS AUREUS    Report Status 02/28/2022 FINAL  Final   Organism ID, Bacteria STAPHYLOCOCCUS AUREUS  Final   Organism ID, Bacteria METHICILLIN RESISTANT STAPHYLOCOCCUS AUREUS  Final      Susceptibility   Methicillin resistant staphylococcus aureus - MIC*    CIPROFLOXACIN <=0.5 SENSITIVE Sensitive     ERYTHROMYCIN >=8 RESISTANT Resistant     GENTAMICIN <=0.5 SENSITIVE Sensitive     OXACILLIN >=4 RESISTANT Resistant     TETRACYCLINE <=1 SENSITIVE Sensitive     VANCOMYCIN 2 SENSITIVE Sensitive     TRIMETH/SULFA <=10 SENSITIVE Sensitive     CLINDAMYCIN <=0.25 SENSITIVE Sensitive     RIFAMPIN <=0.5 SENSITIVE Sensitive     Inducible Clindamycin NEGATIVE Sensitive     * METHICILLIN RESISTANT  STAPHYLOCOCCUS AUREUS   Staphylococcus aureus - MIC*    CIPROFLOXACIN <=0.5 SENSITIVE Sensitive     ERYTHROMYCIN <=0.25 SENSITIVE Sensitive     GENTAMICIN <=0.5 SENSITIVE Sensitive     OXACILLIN 1 SENSITIVE Sensitive     TETRACYCLINE <=1 SENSITIVE Sensitive     VANCOMYCIN 2 SENSITIVE Sensitive     TRIMETH/SULFA <=10 SENSITIVE Sensitive     CLINDAMYCIN <=0.25 SENSITIVE Sensitive     RIFAMPIN <=0.5 SENSITIVE Sensitive     Inducible Clindamycin NEGATIVE Sensitive     *  STAPHYLOCOCCUS AUREUS  Blood Culture ID Panel (Reflexed)     Status: None   Collection Time: 02/24/22  6:41 PM  Result Value Ref Range Status   Enterococcus faecalis NOT DETECTED NOT DETECTED Final   Enterococcus Faecium NOT DETECTED NOT DETECTED Final   Listeria monocytogenes NOT DETECTED NOT DETECTED Final   Staphylococcus species NOT DETECTED NOT DETECTED Final   Staphylococcus aureus (BCID) NOT DETECTED NOT DETECTED Final   Staphylococcus epidermidis NOT DETECTED NOT DETECTED Final   Staphylococcus lugdunensis NOT DETECTED NOT DETECTED Final   Streptococcus species NOT DETECTED NOT DETECTED Final   Streptococcus agalactiae NOT DETECTED NOT DETECTED Final   Streptococcus pneumoniae NOT DETECTED NOT DETECTED Final   Streptococcus pyogenes NOT DETECTED NOT DETECTED Final   A.calcoaceticus-baumannii NOT DETECTED NOT DETECTED Final   Bacteroides fragilis NOT DETECTED NOT DETECTED Final   Enterobacterales NOT DETECTED NOT DETECTED Final   Enterobacter cloacae complex NOT DETECTED NOT DETECTED Final   Escherichia coli NOT DETECTED NOT DETECTED Final   Klebsiella aerogenes NOT DETECTED NOT DETECTED Final   Klebsiella oxytoca NOT DETECTED NOT DETECTED Final   Klebsiella pneumoniae NOT DETECTED NOT DETECTED Final   Proteus species NOT DETECTED NOT DETECTED Final   Salmonella species NOT DETECTED NOT DETECTED Final   Serratia marcescens NOT DETECTED NOT DETECTED Final   Haemophilus influenzae NOT DETECTED NOT DETECTED Final    Neisseria meningitidis NOT DETECTED NOT DETECTED Final   Pseudomonas aeruginosa NOT DETECTED NOT DETECTED Final   Stenotrophomonas maltophilia NOT DETECTED NOT DETECTED Final   Candida albicans NOT DETECTED NOT DETECTED Final   Candida auris NOT DETECTED NOT DETECTED Final   Candida glabrata NOT DETECTED NOT DETECTED Final   Candida krusei NOT DETECTED NOT DETECTED Final   Candida parapsilosis NOT DETECTED NOT DETECTED Final   Candida tropicalis NOT DETECTED NOT DETECTED Final   Cryptococcus neoformans/gattii NOT DETECTED NOT DETECTED Final    Comment: Performed at [facility], [address] [place], [place] [address]  Urine Culture     Status: None   Collection Time: 02/25/22 11:26 AM   Specimen: Urine, Random  Result Value Ref Range Status   Specimen Description URINE, RANDOM  Final   Special Requests NONE  Final   Culture   Final    NO GROWTH Performed at [facility], [address], [place], [place] [address]    Report Status 02/27/2022 FINAL  Final  Culture, blood (Routine X 2) w Reflex to ID Panel     Status: Abnormal   Collection Time: 02/25/22  6:21 PM   Specimen: BLOOD RIGHT HAND  Result Value Ref Range Status   Specimen Description BLOOD RIGHT HAND  Final   Special Requests   Final    BOTTLES DRAWN AEROBIC AND ANAEROBIC Blood Culture results may not be optimal due to an inadequate volume of blood received in culture bottles   Culture  Setup Time   Final    GRAM POSITIVE COCCI IN CLUSTERS IN BOTH AEROBIC AND ANAEROBIC BOTTLES CRITICAL VALUE NOTED.  VALUE IS CONSISTENT WITH PREVIOUSLY REPORTED AND CALLED VALUE.    Culture (A)  Final    STAPHYLOCOCCUS AUREUS SUSCEPTIBILITIES PERFORMED ON PREVIOUS CULTURE WITHIN THE LAST 5 DAYS. Performed at [facility], [address] [place], [place] [address]    Report Status 02/28/2022 FINAL  Final  Culture, blood (Routine X 2) w Reflex to ID Panel     Status: None (Preliminary result)   Collection Time:  02/28/22  3:01 AM   Specimen: BLOOD RIGHT HAND  Result Value Ref Range Status   Specimen Description BLOOD RIGHT HAND  Final   Special Requests   Final    BOTTLES DRAWN AEROBIC ONLY Blood Culture results may not be optimal due to an excessive volume of blood received in culture bottles   Culture   Final    NO GROWTH < 12 HOURS Performed at [facility], [address], [place], [place] [address]    Report Status PENDING  Incomplete  Culture, blood (Routine X 2) w Reflex to ID Panel     Status: None (Preliminary result)   Collection Time: 02/28/22  3:01 AM   Specimen: BLOOD LEFT HAND  Result Value Ref Range Status   Specimen Description BLOOD LEFT HAND  Final   Special Requests   Final    BOTTLES DRAWN AEROBIC ONLY Blood Culture results may not be optimal due to an excessive volume of blood received in culture bottles   Culture   Final    NO GROWTH < 12 HOURS Performed at [facility], [address] [place], [place] [address]    Report Status PENDING  Incomplete     Serology:   Imaging: If present, new imagings (plain films, ct scans, and mri) have been personally visualized and interpreted; radiology reports have been reviewed. Decision making incorporated into the Impression / Recommendations.  7/25 tte  1. Left ventricular ejection fraction, by estimation, is 60 to 65%. The  left ventricle has normal function.   2. Right ventricular systolic function is normal. The right ventricular  size is normal. There is normal pulmonary artery systolic pressure.   3. No evidence of mitral valve regurgitation.   4. Persistent tricuspid leaflet mass 1.8 cm x 0.95 cm seen on the  anterior leaflet. Tricuspid valve regurgitation is mild to moderate.   5. Aortic valve regurgitation is not visualized.   7/25 chest ct 1. Development of multifocal airspace opacities and increasing cavitary nodules in both lungs compared with previous chest CT from 2 months ago. Findings are  most consistent with multilobar pneumonia, and the cavitary nodularity suggests possible septic emboli as the etiology. Correlate clinically. Cavitary metastatic disease/lymphoma considered unlikely. CT follow-up recommended. 2. Mildly enlarged mediastinal lymph nodes, likely reactive. 3. Stable small right pleural effusion.  Small pericardial effusion. 4. Mild splenomegaly.  7/25 mri thoracic spine 1. No evidence for acute infection within the thoracic spine. 2. Multifocal infiltrates within the partially visualized lungs, right greater than left, likely multifocal pneumonia. Associated small layering right pleural effusion.   Raymondo Band, MD [facility] [phone] pager    02/28/2022, 1:15 PM

## 2022-03-01 DIAGNOSIS — R7989 Other specified abnormal findings of blood chemistry: Secondary | ICD-10-CM

## 2022-03-01 DIAGNOSIS — I079 Rheumatic tricuspid valve disease, unspecified: Secondary | ICD-10-CM | POA: Diagnosis not present

## 2022-03-01 DIAGNOSIS — B182 Chronic viral hepatitis C: Secondary | ICD-10-CM | POA: Diagnosis not present

## 2022-03-01 DIAGNOSIS — A4101 Sepsis due to Methicillin susceptible Staphylococcus aureus: Secondary | ICD-10-CM | POA: Diagnosis not present

## 2022-03-01 DIAGNOSIS — F1120 Opioid dependence, uncomplicated: Secondary | ICD-10-CM | POA: Diagnosis not present

## 2022-03-01 DIAGNOSIS — I330 Acute and subacute infective endocarditis: Secondary | ICD-10-CM | POA: Diagnosis not present

## 2022-03-01 DIAGNOSIS — B9561 Methicillin susceptible Staphylococcus aureus infection as the cause of diseases classified elsewhere: Secondary | ICD-10-CM | POA: Diagnosis not present

## 2022-03-01 DIAGNOSIS — G894 Chronic pain syndrome: Secondary | ICD-10-CM | POA: Diagnosis not present

## 2022-03-01 DIAGNOSIS — F419 Anxiety disorder, unspecified: Secondary | ICD-10-CM | POA: Diagnosis not present

## 2022-03-01 DIAGNOSIS — I2690 Septic pulmonary embolism without acute cor pulmonale: Secondary | ICD-10-CM | POA: Diagnosis not present

## 2022-03-01 LAB — COMPREHENSIVE METABOLIC PANEL
ALT: 7 U/L (ref 0–44)
AST: 21 U/L (ref 15–41)
Albumin: 2 g/dL — ABNORMAL LOW (ref 3.5–5.0)
Alkaline Phosphatase: 47 U/L (ref 38–126)
Anion gap: 6 (ref 5–15)
BUN: 7 mg/dL (ref 6–20)
CO2: 28 mmol/L (ref 22–32)
Calcium: 7.9 mg/dL — ABNORMAL LOW (ref 8.9–10.3)
Chloride: 99 mmol/L (ref 98–111)
Creatinine, Ser: 0.88 mg/dL (ref 0.61–1.24)
GFR, Estimated: >60 mL/min (ref >60)
Glucose, Bld: 89 mg/dL (ref 70–99)
Potassium: 3.6 mmol/L (ref 3.5–5.1)
Sodium: 133 mmol/L — ABNORMAL LOW (ref 135–145)
Total Bilirubin: 0.4 mg/dL (ref 0.3–1.2)
Total Protein: 6.2 g/dL — ABNORMAL LOW (ref 6.5–8.1)

## 2022-03-01 LAB — CBC
HCT: 28.1 % — ABNORMAL LOW (ref 39.0–52.0)
Hemoglobin: 9.3 g/dL — ABNORMAL LOW (ref 13.0–17.0)
MCH: 27 pg (ref 26.0–34.0)
MCHC: 33.1 g/dL (ref 30.0–36.0)
MCV: 81.4 fL (ref 80.0–100.0)
Platelets: 211 10*3/uL (ref 150–400)
RBC: 3.45 MIL/uL — ABNORMAL LOW (ref 4.22–5.81)
RDW: 15.6 % — ABNORMAL HIGH (ref 11.5–15.5)
WBC: 7.7 10*3/uL (ref 4.0–10.5)
nRBC: 0 % (ref 0.0–0.2)

## 2022-03-01 LAB — CK: Total CK: 25 U/L — ABNORMAL LOW (ref 49–397)

## 2022-03-01 MED ORDER — GABAPENTIN 100 MG PO CAPS
200.0000 mg | ORAL_CAPSULE | Freq: Every day | ORAL | Status: DC
  Administered 2022-03-01 – 2022-03-05 (×5): 200 mg via ORAL
  Filled 2022-03-01 (×5): qty 2

## 2022-03-01 MED ORDER — HYDROXYZINE HCL 25 MG PO TABS
25.0000 mg | ORAL_TABLET | Freq: Three times a day (TID) | ORAL | Status: DC | PRN
  Administered 2022-03-01 – 2022-06-04 (×11): 25 mg via ORAL
  Filled 2022-03-01 (×13): qty 1

## 2022-03-01 NOTE — Progress Notes (Signed)
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 S/p initial cefazolin, then discharged with weekly dalbavancin #Persistent mssa bacteremia 12/22/21 readmitted; s/p weeks cefazolin by 7/05 with planned 2 more weeks cefadroxil which he was taking until 7/19 #n/v/diarrhea resolved sepsis/abx related #Chronic hep C #ivdu   31 yo male admitted 7/24 for recurrent mssa bacteremia after 2 courses of tx for such and angiovac x1, this time also with mrsa bacteremia  ----------- 7/28 assessment Admission and 7/24 repeat bcx showing 2 staph aureus morphology; there is mrsa and mssa. Spoke and confirmed with microbiology lab Suspect he has mrsa infection near end of [facility] stay via picc line infection. This would explain his history  Spoke with CTS dr Cliffton Asters again, tte finding cotinues to show sub-valvular vegetation not amenable to angiovac  Patient's sepsis appears to be better today, no longer hypothermic  Repeat bcx 7/27 ngtd. Will repeat once more tomorrow   Daptomycin added for mrsa bsi, should be ok even with septic pulm emboli      Plan: Repeat bcx tomorrow Continue cefazolin and daptomycin; plan 6 weeks from first persistent negative bcx Will continue to follow Discussed with primary team   I spent more than 35 minute reviewing data/chart, and coordinating care and >50% direct face to face time providing counseling/discussing diagnostics/treatment plan with patient    Principal Problem:   Sepsis ([name]) Active Problems:   MRSA bacteremia   Chronic pain syndrome/methadone abuse    Tobacco abuse   Polysubstance abuse ([name])    Hepatitis C, chronic ([name])   Anxiety   Normocytic anemia   Prolonged QT interval   Elevated serum creatinine   Hypokalemia   Hypomagnesemia   Hyponatremia   Infective endocarditis of tricuspid valve   MSSA bacteremia   Allergies  Allergen Reactions   Penicillins Hives   Amoxil [Amoxicillin Trihydrate]     childhood    Scheduled Meds:  docusate sodium  100 mg Oral BID   enoxaparin (LOVENOX) injection  40 mg Subcutaneous Q24H   feeding supplement  237 mL Oral BID BM   gabapentin  200 mg Oral Daily   methadone  135 mg Oral Daily   multivitamin with minerals  1 tablet Oral Daily   nicotine  14 mg Transdermal Daily   sodium chloride flush  3 mL Intravenous Q12H   traZODone  25 mg Oral QHS   Continuous Infusions:   ceFAZolin (ANCEF) IV 2 g (03/01/22 1253)   DAPTOmycin (CUBICIN) 600 mg in sodium chloride 0.9 % IVPB Stopped (02/28/22 1501)   sodium chloride     PRN Meds:.acetaminophen, ALPRAZolam, ALPRAZolam, bisacodyl, hydrOXYzine, methocarbamol, polyethylene glycol, sodium chloride   SUBJECTIVE: Temperature normalized No n/v/diarrhea No new focal pain No dyspnea or hypoxemia  Review of Systems: ROS All other ROS was negative, except mentioned above     OBJECTIVE: Vitals:   03/01/22 0445 03/01/22 0756  03/01/22 0821 03/01/22 1149  BP: 109/68 (!) 122/49 (!) 104/59 110/67  Pulse: (!) 107  (!) 101 98  Resp: 15 16 15 16   Temp: 99.3 F (37.4 C) 97.7 F (36.5 C) 98.1 F (36.7 C) 98.2 F (36.8 C)  TempSrc: Oral Oral Oral Oral  SpO2: 99% 100% 100% 97%  Weight:      Height:       Body mass index is 23.41 kg/m.  Physical Exam General/constitutional: no distress, pleasant HEENT: Normocephalic, PER, Conj Clear, EOMI, Oropharynx clear Neck supple CV: rrr no mrg Lungs: clear to auscultation, normal respiratory effort Abd: Soft, Nontender Ext: no edema Skin: No Rash Neuro: nonfocal MSK: no peripheral joint swelling/tenderness/warmth; back spines  nontender          Lab Results Lab Results  Component Value Date   WBC 7.7 03/01/2022   HGB 9.3 (L) 03/01/2022   HCT 28.1 (L) 03/01/2022   MCV 81.4 03/01/2022   PLT 211 03/01/2022    Lab Results  Component Value Date   CREATININE 0.88 03/01/2022   BUN 7 03/01/2022   NA 133 (L) 03/01/2022   K 3.6 03/01/2022   CL 99 03/01/2022   CO2 28 03/01/2022    Lab Results  Component Value Date   ALT 7 03/01/2022   AST 21 03/01/2022   GGT 27 12/24/2021   ALKPHOS 47 03/01/2022   BILITOT 0.4 03/01/2022      Microbiology: Recent Results (from the past 240 hour(s))  Culture, blood (Routine x 2)     Status: Abnormal   Collection Time: 02/24/22  6:41 PM   Specimen: BLOOD  Result Value Ref Range Status   Specimen Description BLOOD SITE NOT SPECIFIED  Final   Special Requests   Final    BOTTLES DRAWN AEROBIC ONLY Blood Culture results may not be optimal due to an inadequate volume of blood received in culture bottles   Culture  Setup Time   Final    GRAM POSITIVE COCCI IN CLUSTERS Organism ID to follow CRITICAL RESULT CALLED TO, READ BACK BY AND VERIFIED WITH: PHARMD AUSTIN PAYTES [id number]  1010 AM BY EC CRITICAL RESULT CALLED TO, READ BACK BY AND VERIFIED WITH: 02/26/22 CHARGE NURSE Santo Held 1025 AM BY EC PATIENT DISCHARGED Performed at [facility], [address], [place], [place]    Culture (A)  Final    STAPHYLOCOCCUS AUREUS METHICILLIN RESISTANT STAPHYLOCOCCUS AUREUS    Report Status 02/28/2022 FINAL  Final   Organism ID, Bacteria STAPHYLOCOCCUS AUREUS  Final   Organism ID, Bacteria METHICILLIN RESISTANT STAPHYLOCOCCUS AUREUS  Final      Susceptibility   Methicillin resistant staphylococcus aureus - MIC*    CIPROFLOXACIN <=0.5 SENSITIVE Sensitive     ERYTHROMYCIN >=8 RESISTANT Resistant     GENTAMICIN <=0.5 SENSITIVE Sensitive     OXACILLIN >=4 RESISTANT Resistant     TETRACYCLINE <=1 SENSITIVE Sensitive     VANCOMYCIN 2 SENSITIVE Sensitive      TRIMETH/SULFA <=10 SENSITIVE Sensitive     CLINDAMYCIN <=0.25 SENSITIVE Sensitive     RIFAMPIN <=0.5 SENSITIVE Sensitive     Inducible Clindamycin NEGATIVE Sensitive     * METHICILLIN RESISTANT STAPHYLOCOCCUS AUREUS   Staphylococcus aureus - MIC*    CIPROFLOXACIN <=0.5 SENSITIVE Sensitive     ERYTHROMYCIN <=0.25 SENSITIVE Sensitive     GENTAMICIN <=0.5 SENSITIVE Sensitive     OXACILLIN 1 SENSITIVE Sensitive     TETRACYCLINE <=1 SENSITIVE Sensitive     VANCOMYCIN 2  SENSITIVE Sensitive     TRIMETH/SULFA <=10 SENSITIVE Sensitive     CLINDAMYCIN <=0.25 SENSITIVE Sensitive     RIFAMPIN <=0.5 SENSITIVE Sensitive     Inducible Clindamycin NEGATIVE Sensitive     * STAPHYLOCOCCUS AUREUS  Blood Culture ID Panel (Reflexed)     Status: None   Collection Time: 02/24/22  6:41 PM  Result Value Ref Range Status   Enterococcus faecalis NOT DETECTED NOT DETECTED Final   Enterococcus Faecium NOT DETECTED NOT DETECTED Final   Listeria monocytogenes NOT DETECTED NOT DETECTED Final   Staphylococcus species NOT DETECTED NOT DETECTED Final   Staphylococcus aureus (BCID) NOT DETECTED NOT DETECTED Final   Staphylococcus epidermidis NOT DETECTED NOT DETECTED Final   Staphylococcus lugdunensis NOT DETECTED NOT DETECTED Final   Streptococcus species NOT DETECTED NOT DETECTED Final   Streptococcus agalactiae NOT DETECTED NOT DETECTED Final   Streptococcus pneumoniae NOT DETECTED NOT DETECTED Final   Streptococcus pyogenes NOT DETECTED NOT DETECTED Final   A.calcoaceticus-baumannii NOT DETECTED NOT DETECTED Final   Bacteroides fragilis NOT DETECTED NOT DETECTED Final   Enterobacterales NOT DETECTED NOT DETECTED Final   Enterobacter cloacae complex NOT DETECTED NOT DETECTED Final   Escherichia coli NOT DETECTED NOT DETECTED Final   Klebsiella aerogenes NOT DETECTED NOT DETECTED Final   Klebsiella oxytoca NOT DETECTED NOT DETECTED Final   Klebsiella pneumoniae NOT DETECTED NOT DETECTED Final   Proteus species  NOT DETECTED NOT DETECTED Final   Salmonella species NOT DETECTED NOT DETECTED Final   Serratia marcescens NOT DETECTED NOT DETECTED Final   Haemophilus influenzae NOT DETECTED NOT DETECTED Final   Neisseria meningitidis NOT DETECTED NOT DETECTED Final   Pseudomonas aeruginosa NOT DETECTED NOT DETECTED Final   Stenotrophomonas maltophilia NOT DETECTED NOT DETECTED Final   Candida albicans NOT DETECTED NOT DETECTED Final   Candida auris NOT DETECTED NOT DETECTED Final   Candida glabrata NOT DETECTED NOT DETECTED Final   Candida krusei NOT DETECTED NOT DETECTED Final   Candida parapsilosis NOT DETECTED NOT DETECTED Final   Candida tropicalis NOT DETECTED NOT DETECTED Final   Cryptococcus neoformans/gattii NOT DETECTED NOT DETECTED Final    Comment: Performed at [facility], [address] [place], [place] [address]  Urine Culture     Status: None   Collection Time: 02/25/22 11:26 AM   Specimen: Urine, Random  Result Value Ref Range Status   Specimen Description URINE, RANDOM  Final   Special Requests NONE  Final   Culture   Final    NO GROWTH Performed at [facility], [place] [address] [place], [place] [address]    Report Status 02/27/2022 FINAL  Final  Culture, blood (Routine X 2) w Reflex to ID Panel     Status: Abnormal   Collection Time: 02/25/22  6:21 PM   Specimen: BLOOD RIGHT HAND  Result Value Ref Range Status   Specimen Description BLOOD RIGHT HAND  Final   Special Requests   Final    BOTTLES DRAWN AEROBIC AND ANAEROBIC Blood Culture results may not be optimal due to an inadequate volume of blood received in culture bottles   Culture  Setup Time   Final    GRAM POSITIVE COCCI IN CLUSTERS IN BOTH AEROBIC AND ANAEROBIC BOTTLES CRITICAL VALUE NOTED.  VALUE IS CONSISTENT WITH PREVIOUSLY REPORTED AND CALLED VALUE.    Culture (A)  Final    STAPHYLOCOCCUS AUREUS SUSCEPTIBILITIES PERFORMED ON PREVIOUS CULTURE WITHIN THE LAST 5 DAYS. Performed at [facility]  Lab, [address] [place], [place] [address]    Report Status 02/28/2022 FINAL  Final  Culture, blood (Routine X 2) w Reflex to ID Panel     Status: None (Preliminary result)   Collection Time: 02/28/22  3:01 AM   Specimen: BLOOD RIGHT HAND  Result Value Ref Range Status   Specimen Description BLOOD RIGHT HAND  Final   Special Requests   Final    BOTTLES DRAWN AEROBIC ONLY Blood Culture results may not be optimal due to an excessive volume of blood received in culture bottles   Culture   Final    NO GROWTH 1 DAY Performed at [facility], [place] [address], [place], [place] [address]    Report Status PENDING  Incomplete  Culture, blood (Routine X 2) w Reflex to ID Panel     Status: None (Preliminary result)   Collection Time: 02/28/22  3:01 AM   Specimen: BLOOD LEFT HAND  Result Value Ref Range Status   Specimen Description BLOOD LEFT HAND  Final   Special Requests   Final    BOTTLES DRAWN AEROBIC ONLY Blood Culture results may not be optimal due to an excessive volume of blood received in culture bottles   Culture   Final    NO GROWTH 1 DAY Performed at [facility], [place] [address] [place], [place] [address]    Report Status PENDING  Incomplete     Serology:   Imaging: If present, new imagings (plain films, ct scans, and mri) have been personally visualized and interpreted; radiology reports have been reviewed. Decision making incorporated into the Impression / Recommendations.  7/25 tte  1. Left ventricular ejection fraction, by estimation, is 60 to 65%. The  left ventricle has normal function.   2. Right ventricular systolic function is normal. The right ventricular  size is normal. There is normal pulmonary artery systolic pressure.   3. No evidence of mitral valve regurgitation.   4. Persistent tricuspid leaflet mass 1.8 cm x 0.95 cm seen on the  anterior leaflet. Tricuspid valve regurgitation is mild to moderate.   5. Aortic valve regurgitation is  not visualized.   7/25 chest ct 1. Development of multifocal airspace opacities and increasing cavitary nodules in both lungs compared with previous chest CT from 2 months ago. Findings are most consistent with multilobar pneumonia, and the cavitary nodularity suggests possible septic emboli as the etiology. Correlate clinically. Cavitary metastatic disease/lymphoma considered unlikely. CT follow-up recommended. 2. Mildly enlarged mediastinal lymph nodes, likely reactive. 3. Stable small right pleural effusion.  Small pericardial effusion. 4. Mild splenomegaly.  7/25 mri thoracic spine 1. No evidence for acute infection within the thoracic spine. 2. Multifocal infiltrates within the partially visualized lungs, right greater than left, likely multifocal pneumonia. Associated small layering right pleural effusion.   Jabier Mutton, [facility] [place] [phone] pager    03/01/2022, 2:57 PM

## 2022-03-01 NOTE — Progress Notes (Signed)
  Progress Note   Patient: Reginald Clarke BPZ:[id number] DOB: April 20, 1991 DOA: 02/25/2022     4 DOS: the patient was seen and examined on 03/01/2022        Brief hospital course: Reginald Clarke is a 31 y.o. M with history IVDU, negative tricuspid valve endocarditis due to MSSA bacteremia with septic pulmonary emboli and thoracic vertebral osteomyelitis admitted for recurrent right sided endocarditis, now MRSA and MSSA.      4/8 Diagnosed with pneumonia, discharged from ED 4/12 Blood cultures from 4/8 growing MSSA, admitted to hospital 4/21 TEE showed large TV vegetation 4/26 Underwent angiovac by Dr. Cliffton Asters with residual subendocardial component 5/10 Discharged on oritavancin/dalbavancin   5/20-5/22: Had recurrent fevers despite adherence to orita/dalba appointments 5/22: Blood cultures obtained at outpatient ID appointment  5/24-6/8: Cultures from 5/22 growing MSSA again, readmitted; CT chest redemonstrates septic pulmonary emboli; MRI thoracic spine shows possible T11 discitis  6/8-7/5: Discharged from [facility] to [facility] to complete 6 weeks cefazolin  7/5: Completed cefazolin, discharged from [facility] 7/5 on oral cefdinir 7/19: Completed oral cefdinir, developed fevers again immediately after 7/23: Seen in ER, for fever, weakness, dehdyration blood cultures with bacteremia but patient LWBS 7/24: Returned to ER, in morning, admitted again       Assessment and Plan: * Sepsis ([name])     MSSA bacteremia    MRSA bacteremia     Infective endocarditis of tricuspid valve - Continue cefazolin and daptomycin - Consult infectious disease, appreciate expertise   Hyponatremia Stable  Hypomagnesemia    Hypokalemia    Elevated serum creatinine AKI ruled out.  Cr back to normal  Prolonged QT interval QT interval progrsesively shorter last 2 days, now normal   Normocytic anemia No clinical bleeding, hemoglobin down from admission, suspect this is  dilutional.  Anxiety Discussed with patient, Xanax not acceptable long-term daily medication.  Okay on as needed basis in the hospital, but should avoid. - Continue PRN Xanax for now - Trial hydroxyzine - Add gabapentin  Hepatitis C, chronic ([name]) - Consult ID, appreciate recommendations  Polysubstance abuse ([name]) - Continue methadone  Tobacco abuse - Continue nicotine patch   Chronic pain syndrome/methadone abuse  - Continue methadone          Subjective: Fevers are improved, no vomiting, no confusion, still quite anxious.  No focal joint pains     Physical Exam: Vitals:   03/01/22 0445 03/01/22 0756 03/01/22 0821 03/01/22 1149  BP: 109/68 (!) 122/49 (!) 104/59 110/67  Pulse: (!) 107  (!) 101 98  Resp: 15 16 15 16   Temp: 99.3 F (37.4 C) 97.7 F (36.5 C) 98.1 F (36.7 C) 98.2 F (36.8 C)  TempSrc: Oral Oral Oral Oral  SpO2: 99% 100% 100% 97%  Weight:      Height:       Thin adult male, lying in bed, appropriate Tachycardia, gallop, no LE edema Respiratory rate normal, lungs clear without rales or wheezes, diminished at both bases Abdomen soft no tenderness palpation or guarding No large effusions of the large joints, no tenderness palpation of the shoulders, elbows, knees or feet Attention normal, affect normal, judgment and insight appear normal    Disposition: Status is: Inpatient         Author: , MD 03/01/2022 2:24 PM  For on call review [email or web address].

## 2022-03-01 NOTE — Progress Notes (Signed)
Palliative Medicine Progress Note   Patient Name: Reginald Clarke       Date: 03/01/2022 DOB: 10-Feb-1991  Age: 31 y.o. MRN#: [id number] Attending Physician: Alberteen Sam, * Primary Care Physician: Allwardt, [name], PA-C Admit Date: 02/25/2022    HPI/Patient Profile: 31 y.o. 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 Blood cultures positive now for recurrent MSSA and new MRSA- likely related to PICC line he had at [facility].   Subjective: Chart reviewed. I went to see patient at bedside.  His girlfriend and 56 year old son are visiting.  Reginald reports that he slept well last night after taking trazodone and the additional prn dose of xanax. Discussed continuing this regimen for now.  Reginald expresses appreciation for PMT support with symptom management.  Discussed the importance of continued conversation with patient, family, and the medical team regarding overall plan of care and treatment options, ensuring decisions are within the context of the patients values and GOCs.   Objective:  Physical Exam Vitals reviewed.  Constitutional:      General: He is not in acute distress. Pulmonary:     Effort: Pulmonary effort is normal.  Neurological:     Mental Status: He is alert and oriented to person, place, and time.  Psychiatric:         Behavior: Behavior normal.             Vital Signs: BP 110/67 (BP Location: Left Arm)   Pulse 98   Temp 98.2 F (36.8 C) (Oral)   Resp 16   Ht 5\' 10"  (1.778 m)   Wt 74 kg   SpO2 97%   BMI 23.41 kg/m  SpO2: SpO2: 97 % O2 Device: O2 Device: Room Air O2 Flow Rate:      LBM: Last BM Date : 02/28/22      Palliative Medicine Assessment & Plan   Assessment: Principal Problem:   Sepsis ([name]) Active Problems:  MRSA bacteremia   Chronic pain syndrome/methadone abuse    Tobacco abuse   Polysubstance abuse ([name])   Hepatitis C, chronic ([name])   Anxiety   Normocytic anemia   Prolonged QT interval   Elevated serum creatinine   Hypokalemia   Hypomagnesemia   Hyponatremia   Infective endocarditis of tricuspid valve   MSSA bacteremia    Recommendations/Plan: Continue full code full scope Continue trazodone 25 mg daily at bedtime for sleep Continue Xanax 0.25 mg daily as needed for anxiety Continue Xanax 0.25 mg at bedtime as needed for sleep Appreciate spiritual care assistance with advanced directives PMT will continue to follow   Prognosis:  Unable to determine  Discharge Planning: To Be Determined    Thank you for allowing the Palliative Medicine Team to assist in the care of this patient.   MDM - low   Merry Proud, NP   Please contact Palliative Medicine Team phone at [phone] for questions and concerns.  For individual providers, please see AMION.

## 2022-03-01 NOTE — Progress Notes (Signed)
Ekg will not transmit placed on chart

## 2022-03-01 NOTE — Progress Notes (Signed)
This chaplain responded to PMT consult for creating/updating the Pt. Advance Directive.  The Pt. is awake, waiting for breakfast. The Pt. girlfriend-[name] is at the bedside.  The Pt. is open to the visit and AD education with the chaplain. The chaplain understands [name] and the Pt. grandmother-Emma will share the role of HCPOA.  The Pt. shares he has no clarifying questions at this time and prefers to keep the document to read and review with Kara Mead and [name]. The chaplain understands the Pt. prefers not to complete a Living Will. The Pt. shares he trusts Kara Mead and [name] to make the decisions at the appropriate time. The chaplain affirmed the Pt. ongoing conversations with [name] and Kara Mead.  This chaplain will revisit on Monday to determine the Pt. next steps with completing the AD in the hospital.  Theodosia Paling [phone]

## 2022-03-02 DIAGNOSIS — F419 Anxiety disorder, unspecified: Secondary | ICD-10-CM | POA: Diagnosis not present

## 2022-03-02 DIAGNOSIS — F1120 Opioid dependence, uncomplicated: Secondary | ICD-10-CM | POA: Diagnosis not present

## 2022-03-02 DIAGNOSIS — I330 Acute and subacute infective endocarditis: Secondary | ICD-10-CM | POA: Diagnosis not present

## 2022-03-02 DIAGNOSIS — G894 Chronic pain syndrome: Secondary | ICD-10-CM | POA: Diagnosis not present

## 2022-03-02 DIAGNOSIS — Z515 Encounter for palliative care: Secondary | ICD-10-CM

## 2022-03-02 NOTE — Progress Notes (Signed)
  Progress Note   Patient: Reginald Clarke QQP:[id number] DOB: Apr 26, 1991 DOA: 02/25/2022     5 DOS: the patient was seen and examined on 03/02/2022        Brief hospital course: Reginald Clarke is a 31 y.o. M w/ hx IVDU with MSSA bacteremia native tricuspid valve endocarditis starting Apr 2023 who is admitted for recurrent endocarditis.     4/8 Diagnosed with pneumonia, discharged from ED 4/12 Blood cultures from 4/8 growing MSSA, admitted to hospital 4/21 TEE showed large TV vegetation 4/26 Underwent angiovac by Dr. Cliffton Asters with residual subendocardial component 5/10 Discharged on oritavancin/dalbavancin   5/20-5/22: Had recurrent fevers despite adherence to orita/dalba appointments 5/22: Blood cultures obtained at outpatient ID appointment  5/24-6/8: Cultures from 5/22 growing MSSA again, readmitted; CT chest redemonstrates septic pulmonary emboli; MRI thoracic spine shows possible T11 discitis  6/8-7/5: Discharged from [facility] to complete 6 weeks cefazolin  7/5: Completed cefazolin, discharged from [facility] 7/5 on oral cefdinir 7/19: Completed oral cefdinir, developed fevers again immediately after 7/23: Seen in ER, for fever, weakness, dehdyration blood cultures with bacteremia but patient LWBS 7/24: Returned to ER, in morning, admitted again       Assessment and Plan: * Sepsis ([name])   MSSA bacteremia  MRSA bacteremia  Infective endocarditis of tricuspid valve Bcx 7/23: MRSA and MSSA both in 1/1 (patient left ER triage) Bcx 7/24: MRSA and MSSA both in 1/1 (collected on readmission) Bcx 7/26: NGTD Bcx 7/28: NGTD  - Continue cefazolin and daptomycin - Consult infectious disease, appreciate expertise      Prolonged QT interval QT interval progressively shorter than his admission, normalized 7/28. - Could start SSRI   Normocytic anemia No clinical bleeding   Anxiety Discussed with patient, Xanax not acceptable long-term daily medication.  Okay on as  needed basis in the hospital, but should avoid. - Continue PRN Xanax for now - Continue trial hydroxyzine and gabapentin - Trazodone at night    Polysubstance abuse ([name]) - Continue methadone  Tobacco abuse - Continue nicotine patch            Subjective: No new joint pains, fever, confusion.  No respiratory distress, vomiting, diarrhea     Physical Exam: Vitals:   03/01/22 1958 03/01/22 2317 03/02/22 0345 03/02/22 0757  BP: 118/76 108/63 119/76 102/65  Pulse: (!) 104 (!) 109 100 98  Resp: (!) 21 20 16 18   Temp: 99.6 F (37.6 C) 98.1 F (36.7 C) 98.7 F (37.1 C) 98.6 F (37 C)  TempSrc: Oral Oral Oral Oral  SpO2: 100% 95% 95% 96%  Weight:      Height:       Thin adult male, lying in bed, no acute distress RRR, intermittent appreciated murmurs, no peripheral edema, No respiratory distress, normal respirations, lungs clear without rales or wheezes Abdomen soft no tenderness palpation or guarding, no ascites or distention Attention normal, affect normal, judgment and insight appear normal  Data Reviewed: No new labs  Family Communication: Partner at the bedside    Disposition: Status is: Inpatient         Author: , MD 03/02/2022 3:01 PM  For on call review [email or web address].

## 2022-03-03 DIAGNOSIS — R7881 Bacteremia: Secondary | ICD-10-CM | POA: Diagnosis present

## 2022-03-03 DIAGNOSIS — B182 Chronic viral hepatitis C: Secondary | ICD-10-CM | POA: Diagnosis not present

## 2022-03-03 DIAGNOSIS — Z515 Encounter for palliative care: Secondary | ICD-10-CM | POA: Diagnosis not present

## 2022-03-03 DIAGNOSIS — R7989 Other specified abnormal findings of blood chemistry: Secondary | ICD-10-CM | POA: Diagnosis not present

## 2022-03-03 DIAGNOSIS — A4101 Sepsis due to Methicillin susceptible Staphylococcus aureus: Secondary | ICD-10-CM | POA: Diagnosis not present

## 2022-03-03 DIAGNOSIS — G894 Chronic pain syndrome: Secondary | ICD-10-CM | POA: Diagnosis not present

## 2022-03-03 NOTE — Progress Notes (Signed)
   Palliative Medicine Inpatient Follow Up Note   HPI: 31 y.o. 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 Blood cultures positive now for recurrent MSSA and new MRSA- likely related to PICC line he had at [facility].   Today's Discussion 03/03/2022  *Please note that this is a verbal dictation therefore any spelling or grammatical errors are due to the "[place] [employer]" system interpretation.  Chart reviewed inclusive of vital signs, progress notes, laboratory results, and diagnostic images.   I met with Reginald Clarke this afternoon.  We discussed the reason for palliative care involvement in the setting of his symptoms.  Reginald Clarke shares with me that he has had relief of his feelings of anxiousness in the setting of trazodone and Xanax.  Discussed continuing this regiment for the time being which she is in agreement with.  Reginald Clarke denies any other troubling symptoms at this point in time.  Questions and concerns addressed/Palliative Support Provided.   Objective Assessment: Vital Signs Vitals:   03/03/22 0012 03/03/22 0422  BP: 110/65 106/66  Pulse: (!) 105 80  Resp: 16 20  Temp: 98.3 F (36.8 C) 98.9 F (37.2 C)  SpO2: 92% 94%    Intake/Output Summary (Last 24 hours) at 03/03/2022 1330 Last data filed at 03/03/2022 0013 Gross per 24 hour  Intake --  Output 0 ml  Net 0 ml   Last Weight  Most recent update: 02/25/2022 11:08 AM    Weight  74 kg (163 lb 2.3 oz)            Gen: Young Caucasian male in no acute distress HEENT: moist mucous  membranes CV: Regular rate and rhythm PULM: clear to auscultation bilaterally. No wheezes/rales/rhonchi ABD: soft/nontender/nondistended/normal bowel sounds EXT: No edema Neuro: Alert and oriented x3  SUMMARY OF RECOMMENDATIONS   Continue full code full scope Continue trazodone 25 mg daily at bedtime for sleep Continue Xanax 0.25 mg daily as needed for anxiety Continue Xanax 0.25 mg at bedtime as needed for sleep Appreciate spiritual care assistance with advanced directives PMT will continue to follow incrementally  Billing based on MDM: High ______________________________________________________________________________________ [facility] Team Team Cell Phone: [phone] Please utilize secure chat with additional questions, if there is no response within 30 minutes please call the above phone number  Palliative Medicine Team providers are available by phone from 7am to 7pm daily and can be reached through the team cell phone.  Should this patient require assistance outside of these hours, please call the patient's attending physician.

## 2022-03-03 NOTE — Progress Notes (Signed)
  Progress Note   Patient: Reginald Clarke MVE:[id number] DOB: 1991/06/29 DOA: 02/25/2022     6 DOS: the patient was seen and examined on 03/03/2022        Brief hospital course: [name] is a 31 y.o. M w/ hx IVDU with MSSA bacteremia native tricuspid valve endocarditis starting Apr 2023 who is admitted for recurrent endocarditis.     4/8 Diagnosed with pneumonia, discharged from ED 4/12 Blood cultures from 4/8 growing MSSA, admitted to hospital 4/21 TEE showed large TV vegetation 4/26 Underwent angiovac by Dr. Cliffton Asters with residual subendocardial component 5/10 Discharged on oritavancin/dalbavancin   5/20-5/22: Had recurrent fevers despite adherence to orita/dalba appointments 5/22: Blood cultures obtained at outpatient ID appointment  5/24-6/8: Cultures from 5/22 growing MSSA again, readmitted; CT chest redemonstrates septic pulmonary emboli; MRI thoracic spine shows possible T11 discitis  6/8-7/5: Discharged from [facility] to [facility] to complete 6 weeks cefazolin  7/5: Completed cefazolin, discharged from [facility] 7/5 on oral cefdinir 7/19: Completed oral cefdinir, developed fevers again immediately after 7/23: Seen in ER, for fever, weakness, dehdyration blood cultures with bacteremia but patient LWBS 7/24: Returned to ER, in morning, admitted again       Assessment and Plan: Sepsis ([name])     MSSA bacteremia    MRSA bacteremia     Infective endocarditis of tricuspid valve Bcx 7/23: MRSA and MSSA both in 1/1 (patient left ER triage) Bcx 7/24: MRSA and MSSA both in 1/1 (collected on readmission) Bcx 7/26: NGTD Bcx 7/28: NGTD  - Continue cefazolin and daptomycin - Consult infectious disease, appreciate expertise   Hyponatremia Stable  Hypomagnesemia    Hypokalemia    Elevated serum creatinine AKI ruled out.  Cr back to normal  Prolonged QT interval QT interval progressively shorter than his admission, normalized 7/28. - Could start SSRI   Normocytic  anemia No clinical bleeding   Anxiety Discussed with patient, Xanax not acceptable long-term daily medication.  Okay on as needed basis in the hospital, but should avoid. - Continue PRN Xanax for now - Continue trial hydroxyzine and gabapentin - Trazodone at night  Hepatitis C, chronic ([name]) - Consult ID, appreciate recommendations  Polysubstance abuse ([name]) - Continue methadone  Tobacco abuse - Continue nicotine patch   Chronic pain syndrome/methadone abuse  - Continue methadone          Subjective: Restarted on new complaints.  Slept well.  No sweats, no confusion, no respiratory distress.     Physical Exam: Vitals:   03/02/22 2114 03/03/22 0012 03/03/22 0422 03/03/22 1535  BP: 114/74 110/65 106/66 121/78  Pulse: 99 (!) 105 80 98  Resp: 18 16 20 19   Temp: 99.1 F (37.3 C) 98.3 F (36.8 C) 98.9 F (37.2 C) 98 F (36.7 C)  TempSrc: Oral Oral Oral Oral  SpO2: 99% 92% 94% 92%  Weight:      Height:       Thin adult male, lying in bed, no acute distress RRR, systolic murmur noted, no peripheral edema Respiratory rate normal, lungs diminished overall, I do not really appreciate rales, no wheezes Abdomen soft without tenderness palpation or guarding No deformities electrolytes effusions of the large joints Attention normal, affect appropriate, judgment insight appear normal  Data Reviewed: No new lab  Family Communication: Father  at bedside    Disposition: Status is: Inpatient         Author: , MD 03/03/2022 5:04 PM  For on call review [email or web address].

## 2022-03-03 NOTE — Progress Notes (Signed)
Report given to [facility] RN, patient is being transferred to 5W room 23. Vitals stable, denies any pain or discomfort at this time. Patient is packing his belongings.

## 2022-03-03 NOTE — Progress Notes (Signed)
Patient transferred to [phone] via wheelchair per 2 Nts, all patient belongings send with patient.

## 2022-03-04 DIAGNOSIS — R7989 Other specified abnormal findings of blood chemistry: Secondary | ICD-10-CM | POA: Diagnosis not present

## 2022-03-04 DIAGNOSIS — A4101 Sepsis due to Methicillin susceptible Staphylococcus aureus: Secondary | ICD-10-CM | POA: Diagnosis not present

## 2022-03-04 DIAGNOSIS — B182 Chronic viral hepatitis C: Secondary | ICD-10-CM | POA: Diagnosis not present

## 2022-03-04 DIAGNOSIS — G894 Chronic pain syndrome: Secondary | ICD-10-CM | POA: Diagnosis not present

## 2022-03-04 NOTE — Progress Notes (Signed)
  Progress Note   Patient: Reginald Clarke [id number]:[id number] DOB: 03/11/1991 DOA: 02/25/2022     7 DOS: the patient was seen and examined on 03/04/2022        Brief hospital course: Reginald Clarke is a 31 y.o. M w/ hx IVDU with MSSA bacteremia native tricuspid valve endocarditis starting Apr 2023 who is admitted for recurrent endocarditis.    See prior summary        Assessment and Plan: Sepsis ([name]) MSSA bacteremia MRSA bacteremia Infective endocarditis of tricuspid valve - Continue cefazolin and daptomycin    Anxiety - Continue PRN Xanax for now - Continue trial hydroxyzine and gabapentin - Trazodone at night  Polysubstance abuse ([name]) - Continue methadone           Subjective: No new joint pain, no fever, no confusion, still has a mild cough.     Physical Exam: Vitals:   03/03/22 1953 03/04/22 0100 03/04/22 0400 03/04/22 0854  BP: 127/70 118/76 (!) 144/66 119/81  Pulse: (!) 103  91 98  Resp: 20 16  18   Temp: 98.4 F (36.9 C) 98.3 F (36.8 C) 98.3 F (36.8 C) 98.3 F (36.8 C)  TempSrc: Oral Oral Oral Oral  SpO2: 98%  93%   Weight:      Height:       Thin adult male, lying in bed, no acute distress RRR, gallop noted, no peripheral edema, no JVD Respiratory rate normal, lungs clear without rales or wheezes Abdomen soft no tenderness palpation or guarding Normal, affect normal, judgment and insight appear normal  Data Reviewed: No new labs     Disposition: Status is: Inpatient         Author: , MD 03/04/2022 11:03 AM  For on call review [email or web address].

## 2022-03-04 NOTE — TOC Progression Note (Signed)
Transition of Care [facility]) - Progression Note    Patient Details  Name: [place] Slyter MRN: [id number] Date of Birth: 1991-04-23  [facility] CM/SW Contact  Harriet Masson, RN Phone Number: 03/04/2022, 1:50 PM  Clinical Narrative:    Infectious disease note from 03/01/22 states "Continue cefazolin and daptomycin; plan 6 weeks from first persistent negative bcx"  TOC will continue to follow for needs.   Expected Discharge Plan: Home/Self Care Barriers to Discharge: Continued Medical Work up  Expected Discharge Plan and Services Expected Discharge Plan: Home/Self Care   Discharge Planning Services: CM Consult   Living arrangements for the past 2 months: Single Family Home                 DME Arranged: N/A         HH Arranged: NA           Social Determinants of Health (SDOH) Interventions    Readmission Risk Interventions    02/26/2022   10:07 AM  Readmission Risk Prevention Plan  Transportation Screening Complete  PCP or Specialist Appt within 3-5 Days Complete  HRI or Home Care Consult Complete  Palliative Care Screening Not Applicable

## 2022-03-05 ENCOUNTER — Inpatient Hospital Stay: Payer: Self-pay

## 2022-03-05 DIAGNOSIS — R7989 Other specified abnormal findings of blood chemistry: Secondary | ICD-10-CM | POA: Diagnosis not present

## 2022-03-05 DIAGNOSIS — G894 Chronic pain syndrome: Secondary | ICD-10-CM | POA: Diagnosis not present

## 2022-03-05 DIAGNOSIS — A4101 Sepsis due to Methicillin susceptible Staphylococcus aureus: Secondary | ICD-10-CM | POA: Diagnosis not present

## 2022-03-05 DIAGNOSIS — B182 Chronic viral hepatitis C: Secondary | ICD-10-CM | POA: Diagnosis not present

## 2022-03-05 LAB — CULTURE, BLOOD (ROUTINE X 2)
Culture: NO GROWTH
Culture: NO GROWTH

## 2022-03-05 MED ORDER — ESCITALOPRAM OXALATE 10 MG PO TABS
10.0000 mg | ORAL_TABLET | Freq: Every day | ORAL | Status: DC
  Administered 2022-03-05 – 2022-06-03 (×90): 10 mg via ORAL
  Filled 2022-03-05 (×90): qty 1

## 2022-03-05 NOTE — Progress Notes (Incomplete)
[facility] Infectious Diseases Follow Up Note  Patient Identification: Patient Name: Reginald Clarke MRN: [id number] Admit Date: 02/25/2022 10:48 AM Age: 31 y.o.Today's Date: 03/06/2022  Reason for Visit: bacteremia   Principal Problem:   Sepsis ([name]) Active Problems:   MRSA bacteremia   Chronic pain syndrome/methadone abuse    Tobacco abuse   Polysubstance abuse ([name])   Hepatitis C, chronic ([name])   Anxiety   Normocytic anemia   Prolonged QT interval   Elevated serum creatinine   Hypokalemia   Hypomagnesemia   Hyponatremia   Infective endocarditis of tricuspid valve   MSSA bacteremia   Antibiotics:  Cefazolin 7/24-c Daptomycin 7/27-c  Lines/Hardwares: PICC rt UE   Interval Events: continues to be afebrile    Assessment # MSSA/MRSA bacteremia in the setting of recent PICC line use for IV cefazolin  - PICC removed  - Repeat blood cx 7/24 NG Final, 7/29 NG in 4 days - TTE 7/25 Persistent tricuspid leaflet mass 1.8 cm x 0.95 cm seen on the  anterior leaflet - No surgical candidate per CTVS  # 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 s/p angiovac with residual subvalvular component of vegetation, not amenable for repeat angiovac, septic pulmonary emboli, T10-T11 OM s/p IV cefazplin then discharged on weekly dalbavancin  # Persistent MSSA bacteremia 5/23 with readmission s/p 6 weeks of cefazolin by 7/5 with planned 2 weeks of PO cefadroxil which he was taking until 7/19 # Chronic Hepatitis C  # IVDU  Recommendations Continue 6 weeks of IV daptomycin and cefazolin from date of negative blood cultures on 7/27 Patient willing to stay inpatient for the duration of tx Hepatitis C tx as OP Monitor CBC , BMP and CPK Please call us back when approaching end date for arranging fu as well as consideration for repeat echocardiogram at end of tx ID available as needed. Please call with questions   Rest of the management as per the  primary team. Thank you for the consult. Please page with pertinent questions or concerns.  ______________________________________________________________________ Subjective patient seen and examined at the bedside.  No complains Spoke with girlfriend at bedside.   Vitals BP 110/69 (BP Location: Right Arm)   Pulse 84   Temp 98.1 F (36.7 C) (Oral)   Resp 16   Ht 5\' 10"  (1.778 m)   Wt 74 kg   SpO2 93%   BMI 23.41 kg/m     Physical Exam Constitutional:  lying in the bed and appears comfortable    Comments:   Cardiovascular:     Rate and Rhythm: Normal rate and regular rhythm.     Heart sounds: systolic murmur  Pulmonary:     Effort: Pulmonary effort is normal.     Comments: Normal breath sounds   Abdominal:     Palpations: Abdomen is soft.     Tenderness: non distended and non tender   Musculoskeletal:        General: No swelling or tenderness.   Neurological:     General: No focal deficit present. Awake, alert and oriented   Psychiatric:        Mood and Affect: Mood normal.   Pertinent Microbiology Results for orders placed or performed during the hospital encounter of 02/25/22  Urine Culture     Status: None   Collection Time: 02/25/22 11:26 AM   Specimen: Urine, Random  Result Value Ref Range Status   Specimen Description URINE, RANDOM  Final   Special Requests NONE  Final   Culture  Final    NO GROWTH Performed at [facility], [address] [place], [place] [address]    Report Status 02/27/2022 FINAL  Final  Culture, blood (Routine X 2) w Reflex to ID Panel     Status: Abnormal   Collection Time: 02/25/22  6:21 PM   Specimen: BLOOD RIGHT HAND  Result Value Ref Range Status   Specimen Description BLOOD RIGHT HAND  Final   Special Requests   Final    BOTTLES DRAWN AEROBIC AND ANAEROBIC Blood Culture results may not be optimal due to an inadequate volume of blood received in culture bottles   Culture  Setup Time   Final    GRAM POSITIVE  COCCI IN CLUSTERS IN BOTH AEROBIC AND ANAEROBIC BOTTLES CRITICAL VALUE NOTED.  VALUE IS CONSISTENT WITH PREVIOUSLY REPORTED AND CALLED VALUE.    Culture (A)  Final    STAPHYLOCOCCUS AUREUS SUSCEPTIBILITIES PERFORMED ON PREVIOUS CULTURE WITHIN THE LAST 5 DAYS. Performed at [facility], [address], [place], [place] [address]    Report Status 02/28/2022 FINAL  Final  Culture, blood (Routine X 2) w Reflex to ID Panel     Status: None   Collection Time: 02/28/22  3:01 AM   Specimen: BLOOD RIGHT HAND  Result Value Ref Range Status   Specimen Description BLOOD RIGHT HAND  Final   Special Requests   Final    BOTTLES DRAWN AEROBIC ONLY Blood Culture results may not be optimal due to an excessive volume of blood received in culture bottles   Culture   Final    NO GROWTH 5 DAYS Performed at [facility], [address] [place], [place] [address]    Report Status 03/05/2022 FINAL  Final  Culture, blood (Routine X 2) w Reflex to ID Panel     Status: None   Collection Time: 02/28/22  3:01 AM   Specimen: BLOOD LEFT HAND  Result Value Ref Range Status   Specimen Description BLOOD LEFT HAND  Final   Special Requests   Final    BOTTLES DRAWN AEROBIC ONLY Blood Culture results may not be optimal due to an excessive volume of blood received in culture bottles   Culture   Final    NO GROWTH 5 DAYS Performed at [facility], [address] [place], [place] [address]    Report Status 03/05/2022 FINAL  Final  Culture, blood (Routine X 2) w Reflex to ID Panel     Status: None (Preliminary result)   Collection Time: 03/02/22  5:49 AM   Specimen: BLOOD LEFT HAND  Result Value Ref Range Status   Specimen Description BLOOD LEFT HAND  Final   Special Requests   Final    BOTTLES DRAWN AEROBIC AND ANAEROBIC Blood Culture results may not be optimal due to an excessive volume of blood received in culture bottles   Culture   Final    NO GROWTH 4 DAYS Performed at [facility]  [facility], [address] [place], [place] [address]    Report Status PENDING  Incomplete  Culture, blood (Routine X 2) w Reflex to ID Panel     Status: None (Preliminary result)   Collection Time: 03/02/22  5:50 AM   Specimen: BLOOD RIGHT HAND  Result Value Ref Range Status   Specimen Description BLOOD RIGHT HAND  Final   Special Requests   Final    BOTTLES DRAWN AEROBIC AND ANAEROBIC Blood Culture results may not be optimal  due to an excessive volume of blood received in culture bottles   Culture   Final    NO GROWTH 4 DAYS Performed at [facility], [address], [place], [place] [address]    Report Status PENDING  Incomplete   Pertinent Lab.    Latest Ref Rng & Units 03/01/2022    5:59 AM 02/28/2022    3:01 AM 02/27/2022    6:27 AM  CBC  WBC 4.0 - 10.5 K/uL 7.7  8.2  7.2   Hemoglobin 13.0 - 17.0 g/dL 9.3  26.7  8.8   Hematocrit 39.0 - 52.0 % 28.1  30.7  26.0   Platelets 150 - 400 K/uL 211  169  115       Latest Ref Rng & Units 03/01/2022    5:59 AM 02/28/2022    3:01 AM 02/27/2022    6:27 AM  CMP  Glucose 70 - 99 mg/dL 89  124  580   BUN 6 - 20 mg/dL 7  11  7    Creatinine 0.61 - 1.24 mg/dL  9.98  3.38   Sodium 135 - 145 mmol/L 133  136  133   Potassium 3.5 - 5.1 mmol/L 3.6  3.4  3.0   Chloride 98 - 111 mmol/L 99  101  96   CO2 22 - 32 mmol/L 28  28  29    Calcium 8.9 - 10.3 mg/dL 7.9  8.3  7.9   Total Protein 6.5 - 8.1 g/dL 6.2  6.9    Total Bilirubin 0.3 - 1.2 mg/dL 0.4  0.5    Alkaline Phos 38 - 126 U/L 47  52    AST 15 - 41 U/L 21  21    ALT 0 - 44 U/L 7  8       Pertinent Imaging today Plain films and CT images have been personally visualized and interpreted; radiology reports have been reviewed. Decision making incorporated into the Impression / Recommendations.  2.50 EKG SITE RITE  Result Date: 03/05/2022 If Site Rite image not attached, placement could not be confirmed due to current cardiac rhythm.  MR THORACIC SPINE W WO CONTRAST  Result Date:  02/27/2022 CLINICAL DATA:  Initial evaluation for tenderness at T12, bacteremia. EXAM: MRI THORACIC WITHOUT AND WITH CONTRAST TECHNIQUE: Multiplanar and multiecho pulse sequences of the thoracic spine were obtained without and with intravenous contrast. CONTRAST:  68mL GADAVIST GADOBUTROL 1 MMOL/ML IV SOLN COMPARISON:  MRI from 12/27/2021. FINDINGS: Alignment: Physiologic with preservation of the normal thoracic kyphosis. No listhesis. Vertebrae: Vertebral body height maintained without acute or chronic fracture. Bone marrow signal intensity diffusely decreased on T1 weighted imaging, nonspecific, but most commonly related to anemia, smoking, or obesity. No worrisome osseous lesions. No evidence for osteomyelitis discitis or septic arthritis Cord: Normal signal and morphology. No abnormal enhancement. No epidural collections. Paraspinal and other soft tissues: Paraspinous soft tissues demonstrate no acute finding. Multifocal infiltrates noted within the right greater than left lungs. Associated small layering right pleural effusion. Disc levels: Minor for age degenerative spondylosis at T4-5 through T8-9. No significant spinal stenosis. Foramina remain patent. No neural impingement. IMPRESSION: 1. No evidence for acute infection within the thoracic spine. 2. Multifocal infiltrates within the partially visualized lungs, right greater than left, likely multifocal pneumonia. Associated small layering right pleural effusion. Electronically Signed   By: 11m M.D.   On: 02/27/2022 04:57   CT CHEST WO CONTRAST  Result Date: 02/26/2022 CLINICAL DATA:  Chest pain  with cough.  Sepsis. EXAM: CT CHEST WITHOUT CONTRAST TECHNIQUE: Multidetector CT imaging of the chest was performed following the standard protocol without IV contrast. RADIATION DOSE REDUCTION: This exam was performed according to the departmental dose-optimization program which includes automated exposure control, adjustment of the mA and/or kV  according to patient size and/or use of iterative reconstruction technique. COMPARISON:  Radiographs 02/25/2022, 02/24/2022 and 12/26/2021. CT 12/26/2021 FINDINGS: Cardiovascular: No significant vascular findings on noncontrast imaging. There is a new small pericardial effusion. The heart size is normal. Mediastinum/Nodes: New mildly enlarged mediastinal lymph nodes, likely reactive. Representative nodes include a precarinal node measuring 1.1 cm short axis on image 54/3 and a subcarinal node measuring 1.6 cm on image 68/3. No axillary adenopathy. The thyroid gland, trachea and esophagus demonstrate no significant findings. Lungs/Pleura: Stable trace right pleural effusion. No pneumothorax. There are new multifocal airspace opacities in both lungs, most confluent within the right lower lobe. There are associated air bronchograms. There are numerous new and/or enlarging ill-defined pulmonary nodules bilaterally, some of which are cavitary. Cavitation is greatest in the right lower lobe, measuring up to 2.3 cm on image 97/5. Upper abdomen: The visualized upper abdomen appears stable, without acute findings. The spleen appears mildly enlarged. Musculoskeletal/Chest wall: There is no chest wall mass or suspicious osseous finding. IMPRESSION: 1. Development of multifocal airspace opacities and increasing cavitary nodules in both lungs compared with previous chest CT from 2 months ago. Findings are most consistent with multilobar pneumonia, and the cavitary nodularity suggests possible septic emboli as the etiology. Correlate clinically. Cavitary metastatic disease/lymphoma considered unlikely. CT follow-up recommended. 2. Mildly enlarged mediastinal lymph nodes, likely reactive. 3. Stable small right pleural effusion.  Small pericardial effusion. 4. Mild splenomegaly. Electronically Signed   By: Carey Bullocks M.D.   On: 02/26/2022 18:41   ECHOCARDIOGRAM LIMITED  Result Date: 02/26/2022    ECHOCARDIOGRAM LIMITED  REPORT   Patient Name:   Reginald Delbridge Date of Exam: 02/26/2022 Medical Rec #:  [id number]    Height:       70.0 in Accession #:    [id number]   Weight:       163.1 lb Date of Birth:  Jul 20, 1991    BSA:          1.914 m Patient Age:    31 years     BP:           97/59 mmHg Patient Gender: M            HR:           101 bpm. Exam Location:  Inpatient Procedure: Limited Echo, Cardiac Doppler and Color Doppler Indications:    Endocarditis I38  History:        Patient has prior history of Echocardiogram examinations, most                 recent 12/27/2021. Arrythmias:Prolonged QTc; Risk Factors:Current                 Smoker. MSSA bacteremia with native tricuspid valve                 endocarditis, septic emboli to lungs, and T10-11 osteomyelitis                 who was hospitalized from April-May and had angio vac procedure                 followed by weekly dalbavancin and then was readmitted from  5/24-6/8 for the same.                 Chronic hepatitis C.  Sonographer:    Leta Junglingiffany Cooper RDCS Referring Phys: [id number] TRUNG T VU IMPRESSIONS  1. Left ventricular ejection fraction, by estimation, is 60 to 65%. The left ventricle has normal function.  2. Right ventricular systolic function is normal. The right ventricular size is normal. There is normal pulmonary artery systolic pressure.  3. No evidence of mitral valve regurgitation.  4. Persistent tricuspid leaflet mass 1.8 cm x 0.95 cm seen on the anterior leaflet. Tricuspid valve regurgitation is mild to moderate.  5. Aortic valve regurgitation is not visualized. FINDINGS  Left Ventricle: Left ventricular ejection fraction, by estimation, is 60 to 65%. The left ventricle has normal function. Right Ventricle: The right ventricular size is normal. Right ventricular systolic function is normal. There is normal pulmonary artery systolic pressure. The tricuspid regurgitant velocity is 1.95 m/s, and with an assumed right atrial pressure of 8 mmHg,  the estimated  right ventricular systolic pressure is 23.2 mmHg. Tricuspid Valve: Persistent tricuspid leaflet mass 1.8 cm x 0.95 cm seen on the anterior leaflet. Tricuspid valve regurgitation is mild to moderate. Aortic Valve: Aortic valve regurgitation is not visualized. AORTIC VALVE LVOT Vmax:   106.00 cm/s LVOT Vmean:  87.200 cm/s LVOT VTI:    0.113 m TRICUSPID VALVE TR Peak grad:   15.2 mmHg TR Mean grad:   10.0 mmHg TR Vmax:        195.00 cm/s TR Vmean:       149.0 cm/s  SHUNTS Systemic VTI: 0.11 m Carolan ClinesMary Branch Electronically signed by Carolan ClinesMary Branch Signature Date/Time: 02/26/2022/3:52:06 PM    Final    DG Chest 2 View  Result Date: 02/25/2022 CLINICAL DATA:  Provided history: Chest pain. Additional history provided: Patient reports pain in right lower back, intermittent chest pain, shortness of breath. EXAM: CHEST - 2 VIEW COMPARISON:  Prior chest radiographs 02/24/2022 and earlier. Chest CT 12/27/2018 FINDINGS: Heart size within normal limits. Unchanged bandlike and linear opacities within the right mid to lower lung field compatible with scarring. A more nodular opacity within the right lung base is also unchanged. Trace right pleural effusion. No appreciable airspace consolidation within the left lung. No evidence of pneumothorax. No acute bony abnormality identified. IMPRESSION: Nodular opacity within the right lung base, unchanged from yesterday's chest radiograph. A cavitary nodule concerning for pulmonary infection was present at this site on the prior chest CT of 12/26/2021. The nodular opacity present on today's examination may reflect persistent pneumonia or a focus of nodular scarring. Trace right pleural effusion. Bandlike and linear foci of scarring within the right mid to lower lung field. Electronically Signed   By: Jackey LogeKyle  Golden D.O.   On: 02/25/2022 12:30   DG Chest 2 View  Result Date: 02/24/2022 CLINICAL DATA:  Suspected sepsis EXAM: CHEST - 2 VIEW COMPARISON:  12/26/2021 FINDINGS: Cardiac size is  within normal limits. There are no signs of pulmonary edema or focal pulmonary consolidation. Linear densities are seen in right lower lung field. Similar finding was seen in the previous study. There is no pleural effusion or pneumothorax. IMPRESSION: Linear densities in right lower lung field have not changed significantly suggesting scarring. Possibility of residual pneumonia in right lower lung field is not excluded. There are no new infiltrates. There is no pleural effusion. Electronically Signed   By: Ernie AvenaPalani  Rathinasamy M.D.   On: 02/24/2022 19:12    I  spent 60  minutes for this patient encounter including review of prior medical records, coordination of care with primary/other specialist with greater than 50% of time being face to face/counseling and discussing diagnostics/treatment plan with the patient/family.  Electronically signed by:   Odette Fraction, MD Infectious Disease Physician [facility] Pager: [phone]

## 2022-03-05 NOTE — Progress Notes (Signed)
Nutrition Follow-up  DOCUMENTATION CODES:   Not applicable  INTERVENTION:  Encourage adequate PO intake Discontinue Ensure Enlive Continue MVI with minerals daily Request updated weight  NUTRITION DIAGNOSIS:   Inadequate oral intake related to vomiting, nausea as evidenced by per patient/family report.  Improving-  GOAL:   Patient will meet greater than or equal to 90% of their needs  Progressing  MONITOR:   PO intake, Supplement acceptance, Diet advancement, I & O's, Weight trends  REASON FOR ASSESSMENT:   Consult Assessment of nutrition requirement/status  ASSESSMENT:   Pt with hx of hepatitis C, prior IV drug use currently on methadone x3 years, and GERD presented to ED after being notified of positive blood cultures. Pt reports recent poor PO with nausea, vomiting, and diarrhea. Hx complicated by endocarditis which pt recently finished antibiotics for <1 week ago.  Pt resting in bed at time of visit. Denies nausea and vomiting. Reports eating well (at least 3 meals per day). He is ordering some meals in the hospital as well as via [employer] and [employer] from [employer] and [employer]. He was previously drinking Ensure but now that his meal intake has improved, he is not drinking them anymore. Will discontinue these supplements.   No updated weight on file to review.   Medications: colace, MVI, IV abx  Labs reviewed  Diet Order:   Diet Order             Diet regular Room service appropriate? Yes; Fluid consistency: Thin  Diet effective now                  EDUCATION NEEDS:   No education needs have been identified at this time  Skin:  Skin Assessment: Reviewed RN Assessment  Last BM:  7/31 (type 3)  Height:   Ht Readings from Last 1 Encounters:  02/25/22 5\' 10"  (1.778 m)   Weight:   Wt Readings from Last 1 Encounters:  02/25/22 74 kg   Ideal Body Weight:  75.5 kg  BMI:  Body mass index is 23.41 kg/m.  Estimated Nutritional Needs:    Kcal:  2300-2500 kcal/d  Protein:  115-130g/d  Fluid:  >/=2.3L/d  02/27/22, RDN, LDN Clinical Nutrition

## 2022-03-05 NOTE — Progress Notes (Signed)
Progress Note   Patient: Reginald Clarke TFT:[id number] DOB: 1990/09/13 DOA: 02/25/2022     8 DOS: the patient was seen and examined on 03/05/2022        Brief hospital course: Mr. Mcglinn is a 31 y.o. M w/ hx IVDU with MSSA bacteremia native tricuspid valve endocarditis starting Apr 2023 who is admitted for recurrent endocarditis.     4/8 Diagnosed with pneumonia, discharged from ED  4/12 Blood cultures from ED visit 4/8 growing MSSA, admitted to hospital 4/21 TEE showed large TV vegetation 4/26 Underwent angiovac by Dr. Cliffton Asters with residual subendocardial component 5/10 Discharged on oritavancin/dalbavancin   5/20-5/22: Had recurrent fevers despite adherence to orita/dalba appointments 5/22: Blood cultures obtained at outpatient ID appointment  5/24-6/8: Cultures from 5/22 growing MSSA again, readmitted; CT chest redemonstrates septic pulmonary emboli; MRI thoracic spine shows possible T11 discitis  6/8-7/5: Discharged from [facility] to [facility] to complete 6 weeks cefazolin  7/5: Completed cefazolin, discharged from [facility] 7/5 on oral cefdinir; despite cefdinir, patient continued to have fevers  7/23: Seen in ER, for fever, weakness, dehdyration blood cultures with bacteremia but patient LWBS 7/24: Returned to ER, in morning, admitted again         Assessment and Plan: * Sepsis ([name]) MSSA bacteremia MRSA bacteremia Infective endocarditis of tricuspid valve Bcx 7/23: MRSA and MSSA both in 1/1 (patient left ER triage) Bcx 7/24: MRSA and MSSA both in 1/1 (collected on readmission) Bcx 7/26: No growth Bcx 7/28: NGTD  - Continue cefazolin and daptomycin   - Plan for 6 weeks IV antibiotics from negative 7/26 blood cultures, until 9/6 - Consult infectious disease, appreciate expertise - Place PICC today    Anxiety Discussed with patient, Xanax not acceptable long-term daily medication.  Will not d/c for now, as patient reports high anxiety during inpatient stay.  Now  that QTc improved, will start SSRI.  Patient aware that Xanax taper likely during this hospitalization - Start escitalopram - Check ECG tomorrow for QTc  - Continue PRN Xanax for now - Continue PRN hydroxyzine - Trazodone at night   Prolonged QT interval QT interval 600 on admission.  Repeat ECGs showed QT down to 480 now.  - Start escitalopram - Monitor tele - Daily ECG for 2 days to monitor QTc on new SSRI    Polysubstance abuse ([name]) Chronic pain syndrome/methadone abuse  - Continue methadone     Stable or resolved issues: Normocytic anemia No clinical bleeding   Hyponatremia Stable  Hypomagnesemia Treated and resolved  Hypokalemia Treated and resolved  Elevated serum creatinine AKI ruled out.  Cr back to normal  Hepatitis C, chronic ([name]) - Consult ID, appreciate recommendations  Tobacco abuse - Continue nicotine patch           Subjective: No new focal joint pains, no fever no confusion.  No respiratory distress.  Still mild cough.     Physical Exam: Vitals:   03/04/22 2030 03/05/22 0435 03/05/22 0833 03/05/22 1200  BP: 114/78 117/72 120/80 106/82  Pulse: 99 82 90 91  Resp: 16 16 16    Temp: 98.7 F (37.1 C) 98.4 F (36.9 C) 98.3 F (36.8 C) 98.1 F (36.7 C)  TempSrc: Oral Oral Oral Oral  SpO2: 100% 99% 94%   Weight:      Height:       Thin adult male, lying in bed sleeping, rosuses and is interactive and appropriate RRR no murmurs, no LE edema Respiratory rate normal, lungs clear without rales or wheeizng  Abdomen soft with TTP, no distension or ascites Attention nromal, affect normal, judgment and insight appear normal  Data Reviewed: BMP and CBC reviewed and normal Discussed with ID      Disposition: Status is: Inpatient Patient admitted with recurrent endocarditis.  ID recommend inaptient treatment until 9/6  Medically stable           Author: Alberteen Sam, MD 03/05/2022 2:24 PM  For on call  review [email or web address].

## 2022-03-06 DIAGNOSIS — A4101 Sepsis due to Methicillin susceptible Staphylococcus aureus: Secondary | ICD-10-CM | POA: Diagnosis not present

## 2022-03-06 MED ORDER — SODIUM CHLORIDE 0.9% FLUSH
10.0000 mL | INTRAVENOUS | Status: DC | PRN
  Administered 2022-03-11 – 2022-05-12 (×8): 10 mL

## 2022-03-06 MED ORDER — CHLORHEXIDINE GLUCONATE CLOTH 2 % EX PADS
6.0000 | MEDICATED_PAD | Freq: Every day | CUTANEOUS | Status: DC
  Administered 2022-03-06 – 2022-04-09 (×34): 6 via TOPICAL

## 2022-03-06 NOTE — Progress Notes (Signed)
This chaplain is present for F/U spiritual care and notarizing of the Pt. HCPOA. The chaplain is appreciative of Pt. RN-Kelly's assistance.  The Pt. is awake and conversational during the visit. The Pt. girlfriend-[name] is at the bedside. The chaplain listens reflectively as the Pt. and [name] positively talk about the Pt. PICC, increased appetite, and desire to be walking around soon. The chaplain understands the Pt. and [name] are pleased with the Pt. progress.  The chaplain is present with the notary and witnesses for the notarizing of the Pt. Advance Directive:  HCPOA.  The Pt. named Reginald Clarke as his healthcare agent. If this person is unable or unable to serve as the Pt. healthcare agent the Pt. second choice is Reginald Clarke.  The Pt. documented and the chaplain understands Reginald Clarke and Reginald Clarke have  "50/50 decision making rights."  This chaplain gave the Pt. the original AD along with two copies. The chaplain scanned the Pt. AD into EMR.  The chaplain is available for F/U spiritual care as needed.  Chaplain Stephanie Acre [phone]

## 2022-03-06 NOTE — Progress Notes (Signed)
  Progress Note   Patient: Reginald Clarke MRN:[id number] DOB: 09/12/1990 DOA: 02/25/2022     9 DOS: the patient was seen and examined on 03/06/2022        Brief hospital course: Reginald Clarke is a 31 y.o. M w/ hx IVDU with MSSA bacteremia, native tricuspid valve endocarditis starting Apr 2023 who is admitted for recurrent endocarditis.     4/8 Diagnosed with pneumonia, discharged from ED  4/12 Blood cultures from ED visit 4/8 growing MSSA, admitted to hospital 4/21 TEE showed large TV vegetation 4/26 Underwent angiovac by Dr. Lightfoot with residual subendocardial component 5/10 Discharged on oritavancin/dalbavancin   5/20-5/22: Had recurrent fevers despite adherence to orita/dalba appointments 5/22: Blood cultures obtained at outpatient ID appointment  5/24-6/8: Cultures from 5/22 growing MSSA again, readmitted; CT chest redemonstrates septic pulmonary emboli; MRI thoracic spine shows possible T11 discitis  6/8-7/5: Discharged from [facility] to [facility] to complete 6 weeks cefazolin  7/5: Completed cefazolin, discharged from [facility] 7/5 on oral cefdinir; despite cefdinir, patient continued to have fevers  7/23: Seen in ER, for fever, weakness, dehdyration blood cultures with bacteremia but patient LWBS 7/24: Returned to ER, in morning, admitted again         Assessment and Plan: * Sepsis ([name]) MSSA bacteremia 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 - Place PICC today    Anxiety  -Started on Lexapro.  QTc is less than 500.  Will gradually wean off Xanax.   Prolonged QT interval QT interval 600 on admission.  Repeat ECGs showed QT down to 480  now.  - Start escitalopram - Monitor tele - Daily ECG for 2 days to monitor QTc on new SSRI  -QTc 490 today.  Recheck tomorrow.   Polysubstance abuse ([name]) Chronic pain syndrome/methadone abuse  - Continue methadone     Stable or resolved issues: Normocytic anemia No clinical bleeding   Hyponatremia Stable  Hypomagnesemia Treated and resolved  Hypokalemia Treated and resolved  Elevated serum creatinine AKI ruled out.  Cr back to normal  Hepatitis C, chronic ([name]) - Consult ID, appreciate recommendations  Tobacco abuse - Continue nicotine patch           Subjective: Seen and examined.  Girlfriend at bedside.  No overnight events.  Denies any complaints.     Physical Exam: Vitals:   03/06/22 0836 03/06/22 0837 03/06/22 0838 03/06/22 1234  BP: 112/65   123/83  Pulse: 93 89 87 81  Resp: 18   16  Temp: 97.7 F (36.5 C)   98.1 F (36.7 C)  TempSrc: Oral   Oral  SpO2: (!) 87% 92% 91% 95%  Weight:      Height:       Looks comfortable.  Data Reviewed: BMP and CBC.      Disposition: Status is: Inpatient Patient admitted with recurrent endocarditis.  ID recommend inaptient treatment until 9/6  Medically stable           Author: Charlee Squibb, MD 03/06/2022 2:37 PM     

## 2022-03-06 NOTE — H&P (View-Only) (Signed)
  Progress Note   Patient: Reginald Clarke [id number]:[id number] DOB: 05-21-1991 DOA: 02/25/2022     9 DOS: the patient was seen and examined on 03/06/2022        Brief hospital course: Reginald Clarke is a 31 y.o. M w/ hx IVDU with MSSA bacteremia, native tricuspid valve endocarditis starting Apr 2023 who is admitted for recurrent endocarditis.     4/8 Diagnosed with pneumonia, discharged from ED  4/12 Blood cultures from ED visit 4/8 growing MSSA, admitted to hospital 4/21 TEE showed large TV vegetation 4/26 Underwent angiovac by Dr. Cliffton Asters with residual subendocardial component 5/10 Discharged on oritavancin/dalbavancin   5/20-5/22: Had recurrent fevers despite adherence to orita/dalba appointments 5/22: Blood cultures obtained at outpatient ID appointment  5/24-6/8: Cultures from 5/22 growing MSSA again, readmitted; CT chest redemonstrates septic pulmonary emboli; MRI thoracic spine shows possible T11 discitis  6/8-7/5: Discharged from [facility] to [facility] to complete 6 weeks cefazolin  7/5: Completed cefazolin, discharged from [facility] 7/5 on oral cefdinir; despite cefdinir, patient continued to have fevers  7/23: Seen in ER, for fever, weakness, dehdyration blood cultures with bacteremia but patient LWBS 7/24: Returned to ER, in morning, admitted again         Assessment and Plan: * Sepsis ([name]) MSSA bacteremia 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 - Place PICC today    Anxiety  -Started on Lexapro.  QTc is less than 500.  Will gradually wean off Xanax.   Prolonged QT interval QT interval 600 on admission.  Repeat ECGs showed QT down to 480  now.  - Start escitalopram - Monitor tele - Daily ECG for 2 days to monitor QTc on new SSRI  -QTc 490 today.  Recheck tomorrow.   Polysubstance abuse ([name]) Chronic pain syndrome/methadone abuse  - Continue methadone     Stable or resolved issues: Normocytic anemia No clinical bleeding   Hyponatremia Stable  Hypomagnesemia Treated and resolved  Hypokalemia Treated and resolved  Elevated serum creatinine AKI ruled out.  Cr back to normal  Hepatitis C, chronic ([name]) - Consult ID, appreciate recommendations  Tobacco abuse - Continue nicotine patch           Subjective: Seen and examined.  Girlfriend at bedside.  No overnight events.  Denies any complaints.     Physical Exam: Vitals:   03/06/22 0836 03/06/22 0837 03/06/22 0838 03/06/22 1234  BP: 112/65   123/83  Pulse: 93 89 87 81  Resp: 18   16  Temp: 97.7 F (36.5 C)   98.1 F (36.7 C)  TempSrc: Oral   Oral  SpO2: (!) 87% 92% 91% 95%  Weight:      Height:       Looks comfortable.  Data Reviewed: BMP and CBC.      Disposition: Status is: Inpatient Patient admitted with recurrent endocarditis.  ID recommend inaptient treatment until 9/6  Medically stable           Author: Dorcas Carrow, MD 03/06/2022 2:37 PM

## 2022-03-06 NOTE — Progress Notes (Signed)
Peripherally Inserted Central Catheter Placement  The IV Nurse has discussed with the patient and/or persons authorized to consent for the patient, the purpose of this procedure and the potential benefits and risks involved with this procedure.  The benefits include less needle sticks, lab draws from the catheter, and the patient may be discharged home with the catheter. Risks include, but not limited to, infection, bleeding, blood clot (thrombus formation), and puncture of an artery; nerve damage and irregular heartbeat and possibility to perform a PICC exchange if needed/ordered by physician.  Alternatives to this procedure were also discussed.  Bard Power PICC patient education guide, fact sheet on infection prevention and patient information card has been provided to patient /or left at bedside.    PICC Placement Documentation  PICC Single Lumen 03/06/22 Right Basilic 38 cm 0 cm (Active)  Indication for Insertion or Continuance of Line Prolonged intravenous therapies 03/06/22 1205  Exposed Catheter (cm) 1 cm 03/06/22 1205  Site Assessment Clean, Dry, Intact 03/06/22 1205  Line Status Flushed;Saline locked;Blood return noted 03/06/22 1205  Dressing Type Transparent;Securing device 03/06/22 1205  Dressing Status Antimicrobial disc in place 03/06/22 1205  Dressing Intervention New dressing;Other (Comment) 03/06/22 1205  Dressing Change Due 03/13/22 03/06/22 1205       Reginia Forts Albarece 03/06/2022, 12:06 PM

## 2022-03-07 ENCOUNTER — Inpatient Hospital Stay (HOSPITAL_COMMUNITY): Payer: BLUE CROSS/BLUE SHIELD | Admitting: Anesthesiology

## 2022-03-07 ENCOUNTER — Ambulatory Visit (HOSPITAL_COMMUNITY)
Admission: RE | Admit: 2022-03-07 | Payer: BLUE CROSS/BLUE SHIELD | Source: Home / Self Care | Admitting: Internal Medicine

## 2022-03-07 ENCOUNTER — Ambulatory Visit: Payer: BLUE CROSS/BLUE SHIELD | Admitting: Physician Assistant

## 2022-03-07 ENCOUNTER — Inpatient Hospital Stay (HOSPITAL_COMMUNITY): Payer: BLUE CROSS/BLUE SHIELD

## 2022-03-07 ENCOUNTER — Encounter (HOSPITAL_COMMUNITY)
Admission: EM | Disposition: A | Discharge status: Home / Self Care | Payer: BLUE CROSS/BLUE SHIELD | Source: Home / Self Care | Attending: Internal Medicine

## 2022-03-07 ENCOUNTER — Encounter (HOSPITAL_COMMUNITY): Payer: Self-pay | Admitting: Internal Medicine

## 2022-03-07 DIAGNOSIS — B9562 Methicillin resistant Staphylococcus aureus infection as the cause of diseases classified elsewhere: Secondary | ICD-10-CM | POA: Diagnosis not present

## 2022-03-07 DIAGNOSIS — A4101 Sepsis due to Methicillin susceptible Staphylococcus aureus: Secondary | ICD-10-CM | POA: Diagnosis not present

## 2022-03-07 DIAGNOSIS — I330 Acute and subacute infective endocarditis: Secondary | ICD-10-CM | POA: Diagnosis not present

## 2022-03-07 DIAGNOSIS — F1990 Other psychoactive substance use, unspecified, uncomplicated: Secondary | ICD-10-CM

## 2022-03-07 DIAGNOSIS — I361 Nonrheumatic tricuspid (valve) insufficiency: Secondary | ICD-10-CM | POA: Diagnosis not present

## 2022-03-07 DIAGNOSIS — B9561 Methicillin susceptible Staphylococcus aureus infection as the cause of diseases classified elsewhere: Secondary | ICD-10-CM | POA: Diagnosis not present

## 2022-03-07 DIAGNOSIS — R7881 Bacteremia: Secondary | ICD-10-CM | POA: Diagnosis not present

## 2022-03-07 HISTORY — PX: TEE WITHOUT CARDIOVERSION: SHX5443

## 2022-03-07 LAB — CULTURE, BLOOD (ROUTINE X 2)
Culture: NO GROWTH
Culture: NO GROWTH

## 2022-03-07 SURGERY — ECHOCARDIOGRAM, TRANSESOPHAGEAL
Anesthesia: Monitor Anesthesia Care

## 2022-03-07 MED ORDER — DEXMEDETOMIDINE (PRECEDEX) IN NS 20 MCG/5ML (4 MCG/ML) IV SYRINGE
PREFILLED_SYRINGE | INTRAVENOUS | Status: DC | PRN
  Administered 2022-03-07: 20 ug via INTRAVENOUS

## 2022-03-07 MED ORDER — EPHEDRINE SULFATE-NACL 50-0.9 MG/10ML-% IV SOSY
PREFILLED_SYRINGE | INTRAVENOUS | Status: DC | PRN
  Administered 2022-03-07: 5 mg via INTRAVENOUS

## 2022-03-07 MED ORDER — MIDAZOLAM HCL 2 MG/2ML IJ SOLN
INTRAMUSCULAR | Status: DC | PRN
  Administered 2022-03-07: 2 mg via INTRAVENOUS

## 2022-03-07 MED ORDER — SODIUM CHLORIDE 0.9 % IV SOLN
INTRAVENOUS | Status: DC
Start: 2022-03-07 — End: 2022-03-07

## 2022-03-07 MED ORDER — PROPOFOL 10 MG/ML IV BOLUS
INTRAVENOUS | Status: DC | PRN
  Administered 2022-03-07: 200 mg via INTRAVENOUS

## 2022-03-07 MED ORDER — LIDOCAINE 2% (20 MG/ML) 5 ML SYRINGE
INTRAMUSCULAR | Status: DC | PRN
  Administered 2022-03-07: 60 mg via INTRAVENOUS

## 2022-03-07 MED ORDER — ONDANSETRON HCL 4 MG/2ML IJ SOLN
INTRAMUSCULAR | Status: DC | PRN
  Administered 2022-03-07: 4 mg via INTRAVENOUS

## 2022-03-07 MED ORDER — MIDAZOLAM HCL 2 MG/2ML IJ SOLN
INTRAMUSCULAR | Status: AC
  Filled 2022-03-07: qty 2

## 2022-03-07 MED ORDER — SUCCINYLCHOLINE CHLORIDE 200 MG/10ML IV SOSY
PREFILLED_SYRINGE | INTRAVENOUS | Status: DC | PRN
  Administered 2022-03-07: 100 mg via INTRAVENOUS

## 2022-03-07 NOTE — TOC Progression Note (Signed)
Transition of Care [facility]) - Progression Note    Patient Details  Name: Reginald Clarke MRN: [id number] Date of Birth: 08/11/1990  Transition of Care [place]) CM/SW Contact  Mearl Latin, LCSW Phone Number: 03/07/2022, 11:19 AM  Clinical Narrative:    TOC continuing to follow for any needs.    Expected Discharge Plan: Home/Self Care Barriers to Discharge: Continued Medical Work up  Expected Discharge Plan and Services Expected Discharge Plan: Home/Self Care   Discharge Planning Services: CM Consult   Living arrangements for the past 2 months: Single Family Home                 DME Arranged: N/A         HH Arranged: NA           Social Determinants of Health (SDOH) Interventions    Readmission Risk Interventions    02/26/2022   10:07 AM  Readmission Risk Prevention Plan  Transportation Screening Complete  PCP or Specialist Appt within 3-5 Days Complete  HRI or Home Care Consult Complete  Palliative Care Screening Not Applicable

## 2022-03-07 NOTE — Anesthesia Procedure Notes (Signed)
Procedure Name: Intubation Date/Time: 03/07/2022 10:32 AM  Performed by: Janace Litten, CRNAPre-anesthesia Checklist: Patient identified, Emergency Drugs available, Suction available and Patient being monitored Patient Re-evaluated:Patient Re-evaluated prior to induction Oxygen Delivery Method: Circle System Utilized Preoxygenation: Pre-oxygenation with 100% oxygen Induction Type: IV induction, Rapid sequence and Cricoid Pressure applied Laryngoscope Size: Mac and 4 Grade View: Grade I Tube type: Oral Tube size: 7.0 mm Number of attempts: 1 Airway Equipment and Method: Stylet Placement Confirmation: ETT inserted through vocal cords under direct vision, positive ETCO2 and breath sounds checked- equal and bilateral Secured at: 23 cm Tube secured with: Tape Dental Injury: Teeth and Oropharynx as per pre-operative assessment

## 2022-03-07 NOTE — Progress Notes (Signed)
  Progress Note   Patient: Reginald Clarke [id number]:[id number] DOB: 02-25-1991 DOA: 02/25/2022     10 DOS: the patient was seen and examined on 03/07/2022        Brief hospital course: Reginald Clarke is a 31 y.o. M w/ hx IVDU with MSSA bacteremia, native tricuspid valve endocarditis starting Apr 2023 who is admitted for recurrent endocarditis.     4/8 Diagnosed with pneumonia, discharged from ED  4/12 Blood cultures from ED visit 4/8 growing MSSA, admitted to hospital 4/21 TEE showed large TV vegetation 4/26 Underwent angiovac by Dr. Cliffton Asters with residual subendocardial component 5/10 Discharged on oritavancin/dalbavancin   5/20-5/22: Had recurrent fevers despite adherence to orita/dalba appointments 5/22: Blood cultures obtained at outpatient ID appointment  5/24-6/8: Cultures from 5/22 growing MSSA again, readmitted; CT chest redemonstrates septic pulmonary emboli; MRI thoracic spine shows possible T11 discitis  6/8-7/5: Discharged from [facility] to [facility] to complete 6 weeks cefazolin  7/5: Completed cefazolin, discharged from [facility] 7/5 on oral cefdinir; despite cefdinir, patient continued to have fevers  7/23: Seen in ER, for fever, weakness, dehdyration blood cultures with bacteremia but patient LWBS 7/24: Returned to ER, in morning, admitted again         Assessment and Plan: * Sepsis ([name]) MSSA bacteremia Infective endocarditis of tricuspid valve Bcx 7/23: MRSA and MSSA both in 1/1 (patient left ER triage) Bcx 7/24: MRSA and MSSA both in 1/1 (collected on readmission) Bcx 7/26: No growth Bcx 7/28: NGTD TEE 8/3: Severe tricuspid regurgitation with multiple tricuspid vegetations.  - Continue cefazolin and daptomycin  - Plan for 6 weeks IV antibiotics from negative 7/26 blood cultures, until 9/6 - Followed by infectious disease. - has a PICC line.   Anxiety  -Started on Lexapro.  QTc is less than 500.  Gradually wean off Xanax.   Prolonged QT interval QT interval 600  on admission.  Repeat ECGs showed QT down to 480 now.  - Start escitalopram - Monitor tele - Daily ECG for 2 days to monitor QTc on new SSRI  -QTc 490 on subsequent EKG.  Stable.   Polysubstance abuse ([name]) Chronic pain syndrome/methadone abuse  - Continue methadone.  Denies current ongoing use of drugs.     Stable or resolved issues: Normocytic anemia No clinical bleeding   Hyponatremia Stable  Hypomagnesemia Treated and resolved  Hypokalemia Treated and resolved  Elevated serum creatinine AKI ruled out.  Cr back to normal  Hepatitis C, chronic ([name]) - Consult ID, appreciate recommendations  Tobacco abuse - Continue nicotine patch           Subjective: Seen and examined.  Girlfriend at bedside.  No overnight events.  Denies any complaints.     Physical Exam: Vitals:   03/06/22 1624 03/06/22 2000 03/07/22 0849 03/07/22 0931  BP: 110/69 111/70 111/67 123/83  Pulse: 84 86 82 99  Resp: 16 16 16 19   Temp: 98.1 F (36.7 C) 98.2 F (36.8 C) 98.1 F (36.7 C)   TempSrc: Oral Oral Oral   SpO2: 93% 96% 93% 95%  Weight:      Height:       Looks comfortable.  Data Reviewed: BMP and CBC.      Disposition: Status is: Inpatient Patient admitted with recurrent endocarditis.  ID recommend inaptient treatment until 9/6  Medically stable           Author: 11/6, MD 03/07/2022 11:05 AM

## 2022-03-07 NOTE — Anesthesia Postprocedure Evaluation (Signed)
Anesthesia Post Note  Patient: Reginald Clarke  Procedure(s) Performed: TRANSESOPHAGEAL ECHOCARDIOGRAM (TEE)     Patient location during evaluation: PACU Anesthesia Type: General Level of consciousness: awake and alert Pain management: pain level controlled Vital Signs Assessment: post-procedure vital signs reviewed and stable Respiratory status: spontaneous breathing, nonlabored ventilation and respiratory function stable Cardiovascular status: blood pressure returned to baseline and stable Postop Assessment: no apparent nausea or vomiting Anesthetic complications: no   No notable events documented.  Last Vitals:  Vitals:   03/07/22 1115 03/07/22 1130  BP: 107/63 106/68  Pulse: 76 74  Resp: (!) 22 (!) 21  Temp:  37.1 C  SpO2: 92% 95%    Last Pain:  Vitals:   03/07/22 1130  TempSrc:   PainSc: 0-No pain                 Lucretia Kern

## 2022-03-07 NOTE — Interval H&P Note (Signed)
History and Physical Interval Note:  03/07/2022 10:24 AM  Reginald Clarke  has presented today for surgery, with the diagnosis of endocarditis of tricuspid valve.  The various methods of treatment have been discussed with the patient and family. After consideration of risks, benefits and other options for treatment, the patient has consented to  Procedure(s): TRANSESOPHAGEAL ECHOCARDIOGRAM (TEE) (N/A) as a surgical intervention.  The patient's history has been reviewed, patient examined, no change in status, stable for surgery.  I have reviewed the patient's chart and labs.  Questions were answered to the patient's satisfaction.    No loose teeth.  Given nausea our team has discussed general anesthesia with anesthesia team and patient.   Deep Bonawitz A Marisa Hufstetler

## 2022-03-07 NOTE — Progress Notes (Signed)
  Echocardiogram Echocardiogram Transesophageal has been performed.  Delcie Roch 03/07/2022, 11:18 AM

## 2022-03-07 NOTE — Anesthesia Preprocedure Evaluation (Addendum)
Anesthesia Evaluation  Patient identified by MRN, date of birth, ID band Patient awake    Reviewed: Allergy & Precautions, NPO status , Patient's Chart, lab work & pertinent test results  History of Anesthesia Complications Negative for: history of anesthetic complications  Airway Mallampati: II  TM Distance: >3 FB Neck ROM: Full    Dental  (+) Poor Dentition, Dental Advisory Given   Pulmonary Current Smoker and Patient abstained from smoking.,    Pulmonary exam normal        Cardiovascular Normal cardiovascular exam+ Valvular Problems/Murmurs (tricuspid valve endocarditis)      Neuro/Psych Anxiety negative neurological ROS     GI/Hepatic GERD  ,(+)     substance abuse  IV drug use, Hepatitis -, C  Endo/Other  negative endocrine ROS  Renal/GU negative Renal ROS  negative genitourinary   Musculoskeletal negative musculoskeletal ROS (+) narcotic dependent  Abdominal   Peds  Hematology  (+) Blood dyscrasia, anemia ,   Anesthesia Other Findings   Reproductive/Obstetrics                            Anesthesia Physical Anesthesia Plan  ASA: 4  Anesthesia Plan: General   Post-op Pain Management: Minimal or no pain anticipated   Induction: Intravenous, Rapid sequence and Cricoid pressure planned  PONV Risk Score and Plan: 2 and Ondansetron, Dexamethasone, Treatment may vary due to age or medical condition and Midazolam  Airway Management Planned: Oral ETT  Additional Equipment: None  Intra-op Plan:   Post-operative Plan: Extubation in OR  Informed Consent: I have reviewed the patients History and Physical, chart, labs and discussed the procedure including the risks, benefits and alternatives for the proposed anesthesia with the patient or authorized representative who has indicated his/her understanding and acceptance.     Dental advisory given  Plan Discussed with:    Anesthesia Plan Comments: (Patient having N/V this morning. He thinks it is just due to anxiety but he has had significant volume of gastric fluid come up. Decided to proceed with GETA. )       Anesthesia Quick Evaluation

## 2022-03-07 NOTE — Transfer of Care (Signed)
Immediate Anesthesia Transfer of Care Note  Patient: Reginald Clarke  Procedure(s) Performed: TRANSESOPHAGEAL ECHOCARDIOGRAM (TEE)  Patient Location: PACU  Anesthesia Type:General  Level of Consciousness: drowsy, patient cooperative and responds to stimulation  Airway & Oxygen Therapy: Patient Spontanous Breathing  Post-op Assessment: Report given to RN and Post -op Vital signs reviewed and stable  Post vital signs: Reviewed and stable  Last Vitals:  Vitals Value Taken Time  BP    Temp    Pulse 78 03/07/22 1109  Resp 23 03/07/22 1109  SpO2 92 % 03/07/22 1109  Vitals shown include unvalidated device data.  Last Pain:  Vitals:   03/07/22 0931  TempSrc:   PainSc: 0-No pain         Complications: No notable events documented.

## 2022-03-07 NOTE — CV Procedure (Signed)
    TRANSESOPHAGEAL ECHOCARDIOGRAM   NAME:  Reginald Clarke    MRN: [id number] DOB:  06/12/1991    ADMIT DATE: 02/25/2022  INDICATIONS: Infective endocarditis  PROCEDURE:   Informed consent was obtained prior to the procedure. The risks, benefits and alternatives for the procedure were discussed and the patient comprehended these risks.  Risks include, but are not limited to, cough, sore throat, vomiting, nausea, somnolence, esophageal and stomach trauma or perforation, bleeding, low blood pressure, aspiration, pneumonia, infection, trauma to the teeth and death.    Procedural time out performed. The oropharynx was anesthetized with topical 1% benzocaine.    Anesthesia was administered by Dr. Clemens Catholic and team.  The patient's heart rate, blood pressure, and oxygen saturation are monitored continuously during the procedure. T  The transesophageal probe was inserted in the esophagus and stomach without difficulty and multiple views were obtained.   COMPLICATIONS:    There were no immediate complications.  KEY FINDINGS:  Tricuspid valve infective endocarditis with severe tricuspid regurgitation. No other valve vegetations.  Full report to follow. Further management per primary team.   Riley Lam, MD [place]  [facility]  11:00 AM

## 2022-03-08 DIAGNOSIS — A4101 Sepsis due to Methicillin susceptible Staphylococcus aureus: Secondary | ICD-10-CM | POA: Diagnosis not present

## 2022-03-08 LAB — CBC
HCT: 24.2 % — ABNORMAL LOW (ref 39.0–52.0)
Hemoglobin: 7.4 g/dL — ABNORMAL LOW (ref 13.0–17.0)
MCH: 26.1 pg (ref 26.0–34.0)
MCHC: 30.6 g/dL (ref 30.0–36.0)
MCV: 85.5 fL (ref 80.0–100.0)
Platelets: 169 10*3/uL (ref 150–400)
RBC: 2.83 MIL/uL — ABNORMAL LOW (ref 4.22–5.81)
RDW: 15.8 % — ABNORMAL HIGH (ref 11.5–15.5)
WBC: 6.3 10*3/uL (ref 4.0–10.5)
nRBC: 0 % (ref 0.0–0.2)

## 2022-03-08 LAB — BASIC METABOLIC PANEL
Anion gap: 8 (ref 5–15)
BUN: 11 mg/dL (ref 6–20)
CO2: 27 mmol/L (ref 22–32)
Calcium: 8.2 mg/dL — ABNORMAL LOW (ref 8.9–10.3)
Chloride: 100 mmol/L (ref 98–111)
Creatinine, Ser: 1.06 mg/dL (ref 0.61–1.24)
GFR, Estimated: >60 mL/min (ref >60)
Glucose, Bld: 89 mg/dL (ref 70–99)
Potassium: 3.9 mmol/L (ref 3.5–5.1)
Sodium: 135 mmol/L (ref 135–145)

## 2022-03-08 LAB — CK: Total CK: 14 U/L — ABNORMAL LOW (ref 49–397)

## 2022-03-08 MED ORDER — DOCUSATE SODIUM 100 MG PO CAPS: 100.0000 mg | ORAL_CAPSULE | Freq: Every day | ORAL | Status: DC | PRN

## 2022-03-08 NOTE — Progress Notes (Signed)
  Progress Note   Patient: Reginald Clarke NOB:[id number] DOB: [birth year] DOA: 02/25/2022     11 DOS: the patient was seen and examined on 03/08/2022        Brief hospital course: Mr. Colson is a 31 y.o. M w/ hx IVDU with MSSA bacteremia, native tricuspid valve endocarditis starting Apr 2023 who is admitted for recurrent endocarditis.     4/8 Diagnosed with pneumonia, discharged from ED  4/12 Blood cultures from ED visit 4/8 growing MSSA, admitted to hospital 4/21 TEE showed large TV vegetation 4/26 Underwent angiovac by Dr. Cliffton Asters with residual subendocardial component 5/10 Discharged on oritavancin/dalbavancin   5/20-5/22: Had recurrent fevers despite adherence to orita/dalba appointments 5/22: Blood cultures obtained at outpatient ID appointment  5/24-6/8: Cultures from 5/22 growing MSSA again, readmitted; CT chest redemonstrates septic pulmonary emboli; MRI thoracic spine shows possible T11 discitis  6/8-7/5: Discharged from [facility] to [facility] to complete 6 weeks cefazolin  7/5: Completed cefazolin, discharged from [facility] 7/5 on oral cefdinir; despite cefdinir, patient continued to have fevers  7/23: Seen in ER, for fever, weakness, dehdyration blood cultures with bacteremia but patient LWBS 7/24: Returned to ER admitted again  Remains in the hospital, cannot be discharged with indwelling IV line.       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  - Continue cefazolin and daptomycin  - Plan for 6 weeks IV antibiotics from negative 7/26 blood cultures, until 9/6 - Followed by infectious disease. - has a PICC line.   Anxiety  -Started on Lexapro.  QTc is less than 500.  Continue  Xanax.   Prolonged QT interval QT interval 600 on admission.  Repeat ECGs showed QT down to 480 now.  - Start escitalopram - Monitor tele - Daily ECG for 2 days to monitor QTc on new SSRI  -QTc 490 on subsequent EKG.  Stable.   Polysubstance abuse ([name]) Chronic pain syndrome/methadone abuse  - Continue methadone.  Denies current ongoing use of drugs.        Subjective: Seen and examined.  No new events.  I felt some soreness on his chest after echocardiogram yesterday.  Denies any other complaints.     Physical Exam: Vitals:   03/07/22 2317 03/08/22 0406 03/08/22 0500 03/08/22 0805  BP: 110/63 106/62  (!) 101/59  Pulse: 95 85  90  Resp: 18 18    Temp: 98.7 F (37.1 C) 98.8 F (37.1 C)  99.3 F (37.4 C)  TempSrc: Oral Oral  Oral  SpO2: 95% 95%  95%  Weight:   75.1 kg   Height:       Looks comfortable.  Walking around in the room.  Data Reviewed: BMP and CBC.      Disposition: Status is: Inpatient Patient admitted with recurrent endocarditis.  ID recommend inaptient treatment until 9/6  Medically stable           Author: Dorcas Carrow, MD 03/08/2022 11:13 AM

## 2022-03-08 NOTE — Progress Notes (Signed)
This chaplain attempted F/U spiritual care. Pt. Sleeping.  Chaplain Stephanie Acre [phone]

## 2022-03-08 NOTE — Plan of Care (Signed)

## 2022-03-08 NOTE — Progress Notes (Signed)
ID Brief Note   Discussed with Dr Lucilla Lame nurse Darius Bump about new TEE findings, Dr Sherlynn Stalls foot will review TEE once he is back on Monday. Unlikely to be a surgical candidate per last note on 02/28/22.  ID available as needed. Please call with questions   Odette Fraction, MD Infectious Disease Physician [name] [address] [place], [place] [address] Phone: [phone]  Fax: [phone]

## 2022-03-09 ENCOUNTER — Encounter (HOSPITAL_COMMUNITY): Payer: Self-pay | Admitting: Internal Medicine

## 2022-03-09 DIAGNOSIS — A4101 Sepsis due to Methicillin susceptible Staphylococcus aureus: Secondary | ICD-10-CM | POA: Diagnosis not present

## 2022-03-09 MED ORDER — SODIUM CHLORIDE 0.9 % IV SOLN
10.0000 mg/kg | Freq: Every day | INTRAVENOUS | Status: DC
  Administered 2022-03-09 – 2022-04-08 (×31): 750 mg via INTRAVENOUS
  Filled 2022-03-09 (×33): qty 15

## 2022-03-09 NOTE — Progress Notes (Signed)
ID PROGRESS NOTE   31yo M with hx of recurrent TV endocarditis (Recently treated for MSSA) but also most recently found to have MSSA and MRSA bacteremia in the setting of picc line where he completed his prior endocarditis treatment. With ongoing vegetations to TV, unclear if they are also related to MRSA bacteremia.  - will plan to narrow abtx to daptomycin 10mg /kg/day which would cover both MRSA and MSSA.  - discontinue cefazolin - re-evaluate the patient early next week with CT surgery to see if he would be a candidate for redo-angiovac  Caytlyn Evers B. MD MPH [facility] [phone]

## 2022-03-09 NOTE — Plan of Care (Signed)
  Problem: Health Behavior/Discharge Planning: Goal: Ability to manage health-related needs will improve Outcome: Progressing   Problem: Clinical Measurements: Goal: Diagnostic test results will improve Outcome: Progressing   Problem: Activity: Goal: Risk for activity intolerance will decrease Outcome: Progressing   Problem: Nutrition: Goal: Adequate nutrition will be maintained Outcome: Progressing   Problem: Coping: Goal: Level of anxiety will decrease Outcome: Progressing   

## 2022-03-09 NOTE — Progress Notes (Signed)
  Progress Note   Patient: Reginald Clarke [id number]:[id number] DOB: [birth year] DOA: 02/25/2022     12 DOS: the patient was seen and examined on 03/09/2022        Brief hospital course: Reginald Clarke is a 31 y.o. M w/ hx IVDU with MSSA bacteremia, native tricuspid valve endocarditis starting Apr 2023 who is admitted for recurrent endocarditis.     4/8 Diagnosed with pneumonia, discharged from ED  4/12 Blood cultures from ED visit 4/8 growing MSSA, admitted to hospital 4/21 TEE showed large TV vegetation 4/26 Underwent angiovac by Dr. Cliffton Asters with residual subendocardial component 5/10 Discharged on oritavancin/dalbavancin   5/20-5/22: Had recurrent fevers despite adherence to orita/dalba appointments 5/22: Blood cultures obtained at outpatient ID appointment  5/24-6/8: Cultures from 5/22 growing MSSA again, readmitted; CT chest redemonstrates septic pulmonary emboli; MRI thoracic spine shows possible T11 discitis  6/8-7/5: Discharged from [facility] to [facility] to complete 6 weeks cefazolin  7/5: Completed cefazolin, discharged from [facility] 7/5 on oral cefdinir; despite cefdinir, patient continued to have fevers  7/23: Seen in ER, for fever, weakness, dehdyration blood cultures with bacteremia but patient LWBS 7/24: Returned to ER admitted again  Remains in the hospital, cannot be discharged with indwelling IV line.       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  Will be seen by CT surgery but not likely an intervention candidate.  - Currently on cefazolin and daptomycin.  ID changed to daptomycin alone today. - Plan for 6 weeks IV antibiotics from negative 7/26 blood cultures, until 9/6 - Followed by  infectious disease. - has a PICC line.   Anxiety  -Started on Lexapro.  QTc is less than 500.  Wean off Xanax. -Today on Xanax twice daily as well as additional dose at bedtime.  Discontinue additional dose of Xanax.  I have discussed with patient about weaning off Xanax in about 1 week.   Prolonged QT interval QT interval 600 on admission.  Repeat ECGs showed QT down to 480 now.   Polysubstance abuse ([name]) Chronic pain syndrome/methadone abuse  - Continue methadone.  Denies current ongoing use of drugs.        Subjective: Seen and examined.  No events.  Agreed to wean off Xanax but with some difficulties.    Physical Exam: Vitals:   03/08/22 2014 03/08/22 2310 03/09/22 0500 03/09/22 0813  BP: 107/64 106/65  116/72  Pulse: 89 85  92  Resp: 16 17    Temp: 99.4 F (37.4 C) 99.7 F (37.6 C)    TempSrc: Oral Oral    SpO2: 91% 96%  93%  Weight:   80.8 kg   Height:       Looks comfortable.  Laying in bed. Exam unchanged.  Data Reviewed: BMP and CBC.      Disposition: Status is: Inpatient Patient admitted with recurrent endocarditis.  ID recommend inaptient treatment until 9/6  Medically stable           Author: Dorcas Carrow, MD 03/09/2022 11:07 AM

## 2022-03-10 DIAGNOSIS — A4101 Sepsis due to Methicillin susceptible Staphylococcus aureus: Secondary | ICD-10-CM | POA: Diagnosis not present

## 2022-03-10 MED ORDER — LIDOCAINE 5 % EX PTCH
1.0000 | MEDICATED_PATCH | CUTANEOUS | Status: DC
  Administered 2022-03-10 – 2022-03-11 (×2): 1 via TRANSDERMAL
  Filled 2022-03-10 (×2): qty 1

## 2022-03-10 MED ORDER — TRAZODONE HCL 50 MG PO TABS
50.0000 mg | ORAL_TABLET | Freq: Every day | ORAL | Status: DC
  Administered 2022-03-10 – 2022-04-22 (×44): 50 mg via ORAL
  Filled 2022-03-10 (×44): qty 1

## 2022-03-10 MED ORDER — PROMETHAZINE HCL 25 MG PO TABS
12.5000 mg | ORAL_TABLET | Freq: Four times a day (QID) | ORAL | Status: DC | PRN
  Administered 2022-03-10 – 2022-05-15 (×69): 12.5 mg via ORAL
  Filled 2022-03-10 (×76): qty 1

## 2022-03-10 NOTE — Progress Notes (Signed)
  Progress Note   Patient: Reginald Clarke IRC:[id number] DOB: 11-Nov-1990 DOA: 02/25/2022     13 DOS: the patient was seen and examined on 03/10/2022        Brief hospital course: Mr. Severs is a 31 y.o. M w/ hx IVDU with MSSA bacteremia, native tricuspid valve endocarditis starting Apr 2023 who is admitted for recurrent endocarditis.     4/8 Diagnosed with pneumonia, discharged from ED  4/12 Blood cultures from ED visit 4/8 growing MSSA, admitted to hospital 4/21 TEE showed large TV vegetation 4/26 Underwent angiovac by Dr. Cliffton Asters with residual subendocardial component 5/10 Discharged on oritavancin/dalbavancin   5/20-5/22: Had recurrent fevers despite adherence to orita/dalba appointments 5/22: Blood cultures obtained at outpatient ID appointment  5/24-6/8: Cultures from 5/22 growing MSSA again, readmitted; CT chest redemonstrates septic pulmonary emboli; MRI thoracic spine shows possible T11 discitis  6/8-7/5: Discharged from [facility] to [facility] to complete 6 weeks cefazolin  7/5: Completed cefazolin, discharged from [facility] 7/5 on oral cefdinir; despite cefdinir, patient continued to have fevers  7/23: Seen in ER, for fever, weakness, dehdyration blood cultures with bacteremia but patient LWBS 7/24: Returned to ER admitted again  Remains in the hospital, cannot be discharged with indwelling IV line.       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  Will be seen by CT surgery but not likely an intervention candidate.  - Currently on cefazolin and daptomycin.  ID changed to daptomycin monotherapy. - Plan for 6 weeks IV antibiotics from negative 7/26 blood cultures, until 9/6 - Followed by  infectious disease. - has a PICC line.   Anxiety  -Started on Lexapro.  QTc is less than 500.  Weaning off Xanax.  Currently on 0.25 mg twice daily. -He has been using this even at outpatient so it will be difficult to discontinue altogether.  We will keep on low doses.   Prolonged QT interval QT interval 600 on admission.  Repeat ECGs showed QT down to 480 now.   Polysubstance abuse ([name]) Chronic pain syndrome/methadone abuse  - Continue methadone.  Denies current ongoing use of drugs.        Subjective: Seen and examined.  He has some subcostal pain.  Using Tylenol Robaxin.  Will add lidocaine patches.  Complain of nausea.  Will use limited doses of Phenergan because of prolonged QT.    Physical Exam: Vitals:   03/09/22 2020 03/10/22 0145 03/10/22 0731 03/10/22 1200  BP: 122/84 127/86 122/88 (!) 135/93  Pulse: 95 (!) 107 (!) 103 (!) 102  Resp: 16 16 16 18   Temp: 98.7 F (37.1 C) 98.8 F (37.1 C) 98 F (36.7 C) 98.4 F (36.9 C)  TempSrc: Oral Oral Oral Oral  SpO2: 93% 94% 93% 93%  Weight:      Height:       Looks comfortable.  Laying in bed. Exam unchanged.  Data Reviewed: BMP and CBC.      Disposition: Status is: Inpatient Patient admitted with recurrent endocarditis.  ID recommend inaptient treatment until 9/6  Medically stable           Author: 11/6, MD 03/10/2022 2:56 PM

## 2022-03-11 ENCOUNTER — Inpatient Hospital Stay (HOSPITAL_COMMUNITY): Payer: BLUE CROSS/BLUE SHIELD

## 2022-03-11 DIAGNOSIS — A4101 Sepsis due to Methicillin susceptible Staphylococcus aureus: Secondary | ICD-10-CM | POA: Diagnosis not present

## 2022-03-11 MED ORDER — DICLOFENAC SODIUM 1 % EX GEL
2.0000 g | Freq: Four times a day (QID) | CUTANEOUS | Status: DC
Start: 2022-03-11 — End: 2022-03-22
  Administered 2022-03-11 – 2022-03-19 (×19): 2 g via TOPICAL
  Filled 2022-03-11: qty 100

## 2022-03-11 MED ORDER — CALCIUM CARBONATE ANTACID 500 MG PO CHEW
1.0000 | CHEWABLE_TABLET | Freq: Three times a day (TID) | ORAL | Status: DC
  Administered 2022-03-11 – 2022-03-13 (×6): 200 mg via ORAL
  Filled 2022-03-11 (×6): qty 1

## 2022-03-11 NOTE — Progress Notes (Signed)
  Progress Note   Patient: Reginald Clarke CWC:[id number] DOB: 02-May-1991 DOA: 02/25/2022     14 DOS: the patient was seen and examined on 03/11/2022        Brief hospital course: Mr. Samek is a 31 y.o. M w/ hx IVDU with MSSA bacteremia, native tricuspid valve endocarditis starting Apr 2023 who is admitted for recurrent endocarditis.     4/8 Diagnosed with pneumonia, discharged from ED  4/12 Blood cultures from ED visit 4/8 growing MSSA, admitted to hospital 4/21 TEE showed large TV vegetation 4/26 Underwent angiovac by Dr. Cliffton Asters with residual subendocardial component 5/10 Discharged on oritavancin/dalbavancin   5/20-5/22: Had recurrent fevers despite adherence to orita/dalba appointments 5/22: Blood cultures obtained at outpatient ID appointment  5/24-6/8: Cultures from 5/22 growing MSSA again, readmitted; CT chest redemonstrates septic pulmonary emboli; MRI thoracic spine shows possible T11 discitis  6/8-7/5: Discharged from [facility] to [facility] to complete 6 weeks cefazolin  7/5: Completed cefazolin, discharged from [facility] 7/5 on oral cefdinir; despite cefdinir, patient continued to have fevers  7/23: Seen in ER, for fever, weakness, dehdyration blood cultures with bacteremia but patient LWBS 7/24: Returned to ER admitted again  Remains in the hospital, cannot be discharged with indwelling IV line.       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  Will be seen by CT surgery but not likely an intervention candidate.  - Currently on cefazolin and daptomycin.  ID changed to daptomycin monotherapy. - Plan for 6 weeks IV antibiotics from negative 7/26 blood cultures, until 9/6 - Followed by  infectious disease. - has a PICC line.   Anxiety  -Started on Lexapro.  QTc is less than 500.  Weaning off Xanax.  Currently on 0.25 mg twice daily. -He has been using this even at outpatient so it will be difficult to discontinue altogether.  We will keep on low doses.   Prolonged QT interval QT interval 600 on admission.  Repeat ECGs showed QT down to 480 now.   Polysubstance abuse ([name]) Chronic pain syndrome/methadone abuse  - Continue methadone.  Denies current ongoing use of drugs.        Subjective: No new events.  Complains of persistent left-sided subcostal pain.  Will check two-view chest x-ray.    Physical Exam: Vitals:   03/11/22 0330 03/11/22 0500 03/11/22 0920 03/11/22 1243  BP: 133/77  118/79 (!) 130/91  Pulse: (!) 110  (!) 109 (!) 105  Resp: 18  18   Temp: 98.3 F (36.8 C)  98.2 F (36.8 C) 97.7 F (36.5 C)  TempSrc: Oral  Oral Oral  SpO2: 92%   92%  Weight:  81.2 kg    Height:       Looks comfortable.  Data Reviewed: BMP and CBC.      Disposition: Status is: Inpatient Patient admitted with recurrent endocarditis.  ID recommend inaptient treatment until 9/6  Medically stable           Author: Dorcas Carrow, MD 03/11/2022 1:52 PM

## 2022-03-12 DIAGNOSIS — A4101 Sepsis due to Methicillin susceptible Staphylococcus aureus: Secondary | ICD-10-CM | POA: Diagnosis not present

## 2022-03-12 DIAGNOSIS — R7881 Bacteremia: Secondary | ICD-10-CM | POA: Diagnosis not present

## 2022-03-12 DIAGNOSIS — I330 Acute and subacute infective endocarditis: Secondary | ICD-10-CM | POA: Diagnosis not present

## 2022-03-12 DIAGNOSIS — J15212 Pneumonia due to Methicillin resistant Staphylococcus aureus: Secondary | ICD-10-CM

## 2022-03-12 DIAGNOSIS — B9562 Methicillin resistant Staphylococcus aureus infection as the cause of diseases classified elsewhere: Secondary | ICD-10-CM | POA: Diagnosis not present

## 2022-03-12 MED ORDER — ENSURE ENLIVE PO LIQD
237.0000 mL | Freq: Two times a day (BID) | ORAL | Status: DC
  Administered 2022-03-12 – 2022-04-22 (×76): 237 mL via ORAL
  Filled 2022-03-12 (×18): qty 237

## 2022-03-12 MED ORDER — DOXYCYCLINE HYCLATE 100 MG PO TABS
100.0000 mg | ORAL_TABLET | Freq: Two times a day (BID) | ORAL | Status: AC
  Administered 2022-03-12 – 2022-03-19 (×14): 100 mg via ORAL
  Filled 2022-03-12 (×14): qty 1

## 2022-03-12 NOTE — Progress Notes (Addendum)
.                                                            [facility] Infectious Diseases Follow Up Note  Patient Identification: Patient Name: Reginald Clarke MRN: [id number] Admit Date: 02/25/2022 10:48 AM Age: 31 y.o.Today's Date: 03/12/2022  Reason for Visit: Follow up on bacteremia   Principal Problem:   Sepsis [facility]) Active Problems:   MRSA bacteremia   Chronic pain syndrome/methadone abuse    Tobacco abuse   Polysubstance abuse ([name])   Hepatitis C, chronic ([name])   Anxiety   Normocytic anemia   Prolonged QT interval   Elevated serum creatinine   Hypokalemia   Hypomagnesemia   Hyponatremia   Subacute bacterial endocarditis   MSSA bacteremia   IVDU (intravenous drug user)   Antibiotics:  Cefazolin 7/24-8/3 Daptomycin 7/27-c  Lines/Hardwares: PICC rt UE   Interval Events: continues to be afebrile    Assessment # MSSA/MRSA bacteremia in the setting of recent PICC line use for IV cefazolin  - PICC removed  - Repeat blood cx 7/27 NG Final, 7/29 NG Final  - TEE 8/3 still three vegetations on TV leaflets. Largest 1.9 cm X 1.0 cm on the anterior mitral valve leaflet, Septal leaflet 0.7 cm X 0.3 cm, and posterior leaflet 0.9 cm X 0.6 cm. Tricuspid valve regurgitation is severe, eccentric and is related to leaflet malcoaptation - Per Dr Cliffton Asters "I'll take a look this morning but nothing will change. It was subvalvular on the last one. It just needs to be cut out.  He can see me after completing drug rehab and random drug screens"   # H/o Native TV MSSA endocarditis April 2023 s/p angiovac with residual subvalvular component of vegetation, not amenable for repeat angiovac, septic pulmonary emboli  s/p IV cefazolin then discharged on weekly dalbavancin  # Persistent MSSA bacteremia May 2023. T1-T11 discitis?OM with readmission s/p 6 weeks of cefazolin by 7/5 with planned 2 weeks of PO cefdinir which he was taking until 7/19 # Chronic Hepatitis C  # IVDU  Recommendations Continue  Daptomycin 10mg /kg as is. Monitor CPK  Will add doxycycline for 7 days for PNA given new pleuritic chest pain/some cough and bibasilar PNA in chest Xray  Plan for 6 weeks course of Daptomycin from date of negative blood cultures on from 7/27. EOT 04/11/22  Call 06/11/22 back when approaching end date for arranging appropriate follow up with ID/CTVS and HCV tx  ID available as needed, please recall with questions or concerns   Rest of the management as per the primary team. Thank you for the consult. Please page with pertinent questions or concerns.  ______________________________________________________________________ Subjective patient seen and examined at the bedside.  Complains of pleuritic pain at the left chest with some cough with minimal phlegm. Night sweats yesterday   Vitals BP 117/79 (BP Location: Left Arm)   Pulse 100   Temp 98.8 F (37.1 C) (Oral)   Resp 20   Ht 5\' 10"  (1.778 m)   Wt 81.2 kg   SpO2 94%   BMI 25.69 kg/m     Physical Exam Constitutional:  sitting up in bed and appears comfortable    Comments:   Cardiovascular:     Rate and Rhythm: Normal rate and regular rhythm.  Heart sounds: systolic murmur  Pulmonary:     Effort: Pulmonary effort is normal on room air     Comments: Bibasilar rales +  Abdominal:     Palpations: Abdomen is soft.     Tenderness: non distended and non tender   Musculoskeletal:        General: No swelling or tenderness. RT arm picc +  Neurological:     General: No focal deficit present. Awake, alert and oriented. No spinal tenderness   Psychiatric:        Mood and Affect: Mood normal.   Pertinent Microbiology Results for orders placed or performed during the hospital encounter of 02/25/22  Urine Culture     Status: None   Collection Time: 02/25/22 11:26 AM   Specimen: Urine, Random  Result Value Ref Range Status   Specimen Description URINE, RANDOM  Final   Special Requests NONE  Final   Culture   Final    NO  GROWTH Performed at [facility], [address] [place], [place] [address]    Report Status 02/27/2022 FINAL  Final  Culture, blood (Routine X 2) w Reflex to ID Panel     Status: Abnormal   Collection Time: 02/25/22  6:21 PM   Specimen: BLOOD RIGHT HAND  Result Value Ref Range Status   Specimen Description BLOOD RIGHT HAND  Final   Special Requests   Final    BOTTLES DRAWN AEROBIC AND ANAEROBIC Blood Culture results may not be optimal due to an inadequate volume of blood received in culture bottles   Culture  Setup Time   Final    GRAM POSITIVE COCCI IN CLUSTERS IN BOTH AEROBIC AND ANAEROBIC BOTTLES CRITICAL VALUE NOTED.  VALUE IS CONSISTENT WITH PREVIOUSLY REPORTED AND CALLED VALUE.    Culture (A)  Final    STAPHYLOCOCCUS AUREUS SUSCEPTIBILITIES PERFORMED ON PREVIOUS CULTURE WITHIN THE LAST 5 DAYS. Performed at [facility], [address], [place], [place] [address]    Report Status 02/28/2022 FINAL  Final  Culture, blood (Routine X 2) w Reflex to ID Panel     Status: None   Collection Time: 02/28/22  3:01 AM   Specimen: BLOOD RIGHT HAND  Result Value Ref Range Status   Specimen Description BLOOD RIGHT HAND  Final   Special Requests   Final    BOTTLES DRAWN AEROBIC ONLY Blood Culture results may not be optimal due to an excessive volume of blood received in culture bottles   Culture   Final    NO GROWTH 5 DAYS Performed at [facility], [address] [place], [place] [address]    Report Status 03/05/2022 FINAL  Final  Culture, blood (Routine X 2) w Reflex to ID Panel     Status: None   Collection Time: 02/28/22  3:01 AM   Specimen: BLOOD LEFT HAND  Result Value Ref Range Status   Specimen Description BLOOD LEFT HAND  Final   Special Requests   Final    BOTTLES DRAWN AEROBIC ONLY Blood Culture results may not be optimal due to an excessive volume of blood received in culture bottles   Culture   Final    NO GROWTH 5 DAYS Performed at [facility], [address], [place], [place] [address]    Report Status 03/05/2022 FINAL  Final  Culture, blood (Routine X 2) w Reflex to ID Panel     Status: None   Collection Time: 03/02/22  5:49  AM   Specimen: BLOOD LEFT HAND  Result Value Ref Range Status   Specimen Description BLOOD LEFT HAND  Final   Special Requests   Final    BOTTLES DRAWN AEROBIC AND ANAEROBIC Blood Culture results may not be optimal due to an excessive volume of blood received in culture bottles   Culture   Final    NO GROWTH 5 DAYS Performed at [facility], [address] [place], [place] [address]    Report Status 03/07/2022 FINAL  Final  Culture, blood (Routine X 2) w Reflex to ID Panel     Status: None   Collection Time: 03/02/22  5:50 AM   Specimen: BLOOD RIGHT HAND  Result Value Ref Range Status   Specimen Description BLOOD RIGHT HAND  Final   Special Requests   Final    BOTTLES DRAWN AEROBIC AND ANAEROBIC Blood Culture results may not be optimal due to an excessive volume of blood received in culture bottles   Culture   Final    NO GROWTH 5 DAYS Performed at [facility], [address] [place] [address], [place], [place] [address]    Report Status 03/07/2022 FINAL  Final   Pertinent Lab.    Latest Ref Rng & Units 03/08/2022    3:11 AM 03/01/2022    5:59 AM 02/28/2022    3:01 AM  CBC  WBC 4.0 - 10.5 K/uL 6.3  7.7  8.2   Hemoglobin 13.0 - 17.0 g/dL 7.4  9.3  53.9   Hematocrit 39.0 - 52.0 % 24.2  28.1  30.7   Platelets 150 - 400 K/uL 169  211  169       Latest Ref Rng & Units 03/08/2022    3:11 AM 03/01/2022    5:59 AM 02/28/2022    3:01 AM  CMP  Glucose 70 - 99 mg/dL 89  89  767   BUN 6 - 20 mg/dL 11  7  11    Creatinine 0.61 - 1.24 mg/dL  3.41  9.37   Sodium 135 - 145 mmol/L 135  133  136   Potassium 3.5 - 5.1 mmol/L 3.9  3.6  3.4   Chloride 98 - 111 mmol/L 100  99  101   CO2 22 - 32 mmol/L 27  28  28    Calcium 8.9 - 10.3 mg/dL 8.2  7.9  8.3   Total Protein 6.5 - 8.1 g/dL  6.2  6.9   Total  Bilirubin 0.3 - 1.2 mg/dL  0.4  0.5   Alkaline Phos 38 - 126 U/L  47  52   AST 15 - 41 U/L  21  21   ALT 0 - 44 U/L  7  8      Pertinent Imaging today Plain films and CT images have been personally visualized and interpreted; radiology reports have been reviewed. Decision making incorporated into the Impression / Recommendations.  DG Chest 2 View  Result Date: 03/11/2022 CLINICAL DATA:  Endocarditis, chest pain, shortness of breath, multilobar pneumonia and septic emboli by recent chest CT EXAM: CHEST - 2 VIEW COMPARISON:  02/26/2022 FINDINGS: Slight worsening bibasilar opacities and new small effusions, larger on the right. Right PICC line tip lower SVC level. Trachea midline. No superimposed edema pattern. Normal heart size and vascularity. IMPRESSION: Slight worsening bibasilar pneumonia pattern and new small effusions. Electronically Signed   By: 05/11/2022.  Shick M.D.   On: 03/11/2022 14:08     I spent 51  minutes for  this patient encounter including review of prior medical records, coordination of care with primary/other specialist with greater than 50% of time being face to face/counseling and discussing diagnostics/treatment plan with the patient/family.  Electronically signed by:   Odette Fraction, MD Infectious Disease Physician [facility] for Infectious Disease Pager: [phone]

## 2022-03-12 NOTE — Progress Notes (Signed)
  Progress Note   Patient: Reginald Clarke WUX:[id number] DOB: 04-23-1991 DOA: 02/25/2022     15 DOS: the patient was seen and examined on 03/12/2022        Brief hospital course: Reginald Clarke is a 31 y.o. M w/ hx IVDU with MSSA bacteremia, native tricuspid valve endocarditis starting Apr 2023 who is admitted for recurrent endocarditis.     4/8 Diagnosed with pneumonia, discharged from ED  4/12 Blood cultures from ED visit 4/8 growing MSSA, admitted to hospital 4/21 TEE showed large TV vegetation 4/26 Underwent angiovac by Dr. Cliffton Asters with residual subendocardial component 5/10 Discharged on oritavancin/dalbavancin   5/20-5/22: Had recurrent fevers despite adherence to orita/dalba appointments 5/22: Blood cultures obtained at outpatient ID appointment  5/24-6/8: Cultures from 5/22 growing MSSA again, readmitted; CT chest redemonstrates septic pulmonary emboli; MRI thoracic spine shows possible T11 discitis  6/8-7/5: Discharged from [facility] to [facility] to complete 6 weeks cefazolin  7/5: Completed cefazolin, discharged from [facility] 7/5 on oral cefdinir; despite cefdinir, patient continued to have fevers  7/23: Seen in ER, for fever, weakness, dehdyration blood cultures with bacteremia but patient LWBS 7/24: Returned to ER admitted again  Remains in the hospital, cannot be discharged with indwelling IV line.       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  Will be seen by CT surgery but not likely an intervention candidate.  - Currently on cefazolin and daptomycin.  ID changed to daptomycin monotherapy. - Plan for 6 weeks IV antibiotics from negative 7/26 blood cultures, until 9/6 - Followed by  infectious disease. - has a PICC line.   Anxiety  -Started on Lexapro.  QTc is less than 500.  Weaning off Xanax.  Currently on 0.25 mg twice daily. -He has been using this even at outpatient so it will be difficult to discontinue altogether.  We will keep on low doses.   Prolonged QT interval QT interval 600 on admission.  Repeat ECGs showed QT down to 480 now.   Polysubstance abuse ([name]) Chronic pain syndrome/methadone abuse  - Continue methadone.  Denies current ongoing use of drugs.        Subjective: No new events.  Persistent left infrascapular pain.  New x-ray with some atelectasis and minimal pleural effusion.  Afebrile.  Using Voltaren gel.    Physical Exam: Vitals:   03/11/22 1630 03/11/22 2125 03/12/22 0005 03/12/22 0752  BP: 132/86 126/84 116/69 109/67  Pulse: (!) 114 (!) 104 (!) 102 90  Resp:  18 18 18   Temp:  98.8 F (37.1 C) 98.8 F (37.1 C) 98.1 F (36.7 C)  TempSrc:  Oral Oral Oral  SpO2: 93% 93% 94%   Weight:      Height:       Looks comfortable.  Data Reviewed: BMP and CBC.      Disposition: Status is: Inpatient Patient admitted with recurrent endocarditis.  ID recommend inaptient treatment until 9/6  Medically stable           Author: 11/6, MD 03/12/2022 12:19 PM

## 2022-03-12 NOTE — Progress Notes (Signed)
Nutrition Follow-up  DOCUMENTATION CODES:   Not applicable  INTERVENTION:   Continue Multivitamin w/ minerals daily Ensure Enlive po BID, each supplement provides 350 kcal and 20 grams of protein. Snacks daily Encourage good PO intake Meal ordering with assist  NUTRITION DIAGNOSIS:   Inadequate oral intake related to vomiting, nausea as evidenced by per patient/family report. 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 Pt reports recent poor PO with nausea, vomiting, and diarrhea. Hx complicated by endocarditis which pt recently finished antibiotics for <1 week ago.  Pt reports that his appetite is not as good as it was prior. States that he Is having some nausea and rib pain, making it difficult to eat. Pt reports that he has been receiving medication for nausea and heartburn. Discussed adding supplements back due to not eating well, pt agreeable and would like to try all flavors of the Ensure. RD to also order pt daily snack to assist with decreased appetite. Pt with no other questions or concern at this time.   Discussed case with RN outside of room. Provided supplement update to RN.   Medications reviewed and include: TUMS, MVI, IV antibiotics  Labs reviewed.  Diet Order:   Diet Order             Diet regular Room service appropriate? Yes; Fluid consistency: Thin  Diet effective now                   EDUCATION NEEDS:   No education needs have been identified at this time  Skin:  Skin Assessment: Reviewed RN Assessment  Last BM:  8/4  Height:   Ht Readings from Last 1 Encounters:  02/25/22 5\' 10"   (1.778 m)    Weight:   Wt Readings from Last 1 Encounters:  03/11/22 81.2 kg    Ideal Body Weight:  75.5 kg  BMI:  Body mass index is 25.69 kg/m.  Estimated Nutritional Needs:   Kcal:  2300-2500 kcal/d  Protein:  115-130g/d  Fluid:  >/=2.3L/d    05/11/22 RD, LDN Clinical Dietitian See [facility] for contact information.

## 2022-03-13 DIAGNOSIS — A4101 Sepsis due to Methicillin susceptible Staphylococcus aureus: Secondary | ICD-10-CM | POA: Diagnosis not present

## 2022-03-13 MED ORDER — ALUM & MAG HYDROXIDE-SIMETH 200-200-20 MG/5ML PO SUSP
15.0000 mL | Freq: Four times a day (QID) | ORAL | Status: DC | PRN
  Administered 2022-03-13 – 2022-04-10 (×15): 15 mL via ORAL
  Filled 2022-03-13 (×15): qty 30

## 2022-03-13 NOTE — Progress Notes (Signed)
TOC continues to follow for needs. Patient will need to be inpatient for iv antibiotics until 04/10/22.

## 2022-03-13 NOTE — Progress Notes (Signed)
  Progress Note   Patient: Reginald Clarke [id number] DOB: May 02, 1991 DOA: 02/25/2022   hospital day   16 DOS: the patient was seen and examined on 03/13/2022     Brief hospital course: Mr. Salmons is a 31 y.o. M w/ hx IVDU with MSSA bacteremia, native tricuspid valve endocarditis starting Apr 2023 who is admitted for recurrent endocarditis. Chronology of events   4/8 Diagnosed with pneumonia, discharged from ED  4/12 Blood cultures from ED visit 4/8 growing MSSA, admitted to hospital 4/21 TEE showed large TV vegetation 4/26 Underwent angiovac by Dr. Cliffton Asters with residual subendocardial component 5/10 Discharged on oritavancin/dalbavancin   5/20-5/22: Had recurrent fevers despite adherence to orita/dalba appointments 5/22: Blood cultures obtained at outpatient ID appointment  5/24-6/8: Cultures from 5/22 growing MSSA again, readmitted; CT chest redemonstrates septic pulmonary emboli; MRI thoracic spine shows possible T11 discitis  6/8-7/5: Discharged from [facility] to [facility] to complete 6 weeks cefazolin  7/5: Completed cefazolin, discharged from [facility] 7/5 on oral cefdinir; despite cefdinir, patient continued to have fevers  7/23: Seen in ER, for fever, weakness, dehdyration blood cultures with bacteremia but patient LWBS 7/24: Returned to ER admitted again  Remains in the hospital, cannot be discharged with indwelling IV line.       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  Will be seen by CT surgery but not likely an intervention candidate. 8/8, with pleuritic chest pain , cxr with some pleural effusion , repeat cultures remain negative.  ID added doxycycline for one week.   - was on  cefazolin and daptomycin.  ID changed to daptomycin monotherapy. - Plan for 6 weeks IV antibiotics from negative 7/26 blood cultures, until 9/6 - Followed by infectious disease. - has a PICC line.    Anxiety  -Started on Lexapro.  QTc is less than 500.  Weaning off Xanax.  Currently on 0.25 mg twice daily. -He has been using this even at outpatient so it will be difficult to discontinue altogether.  We will keep on low doses.   Prolonged QT interval QT interval 600 on admission.  Repeat ECGs showed QT down to 480 now.   Polysubstance abuse ([name]) Chronic pain syndrome/methadone abuse  - Continue methadone.  Denies current ongoing use of drugs.        Subjective: No new events.  Some improvement of the pleuritic pain with volteran gel. Afebrile.    Physical Exam: Vitals:   03/12/22 2320 03/13/22 0500 03/13/22 0815 03/13/22 1235  BP: 126/71  103/66 123/85  Pulse: (!) 106  98 83  Resp: 20  20 18   Temp: 98.5 F (36.9 C)  98.3 F (36.8 C)   TempSrc: Oral  Oral Oral  SpO2: 91%     Weight:  82 kg    Height:       Looks comfortable. No new clinical exam changes   Data Reviewed: BMP and CBC.      Disposition: Status is: Inpatient Patient admitted with recurrent endocarditis.  ID recommend inaptient treatment until 9/6  Medically stable           Author: 11/6, MD 03/13/2022 2:51 PM

## 2022-03-14 DIAGNOSIS — A4101 Sepsis due to Methicillin susceptible Staphylococcus aureus: Secondary | ICD-10-CM | POA: Diagnosis not present

## 2022-03-14 NOTE — Progress Notes (Signed)
  Progress Note   Patient: Reginald Clarke [id number]:[id number] DOB: 26-Jan-1991 DOA: 02/25/2022   hospital day   17 DOS: the patient was seen and examined on 03/14/2022     Brief hospital course: Reginald Clarke is a 31 y.o. M w/ hx IVDU with MSSA bacteremia, native tricuspid valve endocarditis starting Apr 2023 who is admitted for recurrent endocarditis. Chronology of events   4/8 Diagnosed with pneumonia, discharged from ED  4/12 Blood cultures from ED visit 4/8 growing MSSA, admitted to hospital 4/21 TEE showed large TV vegetation 4/26 Underwent angiovac by Dr. Cliffton Asters with residual subendocardial component 5/10 Discharged on oritavancin/dalbavancin   5/20-5/22: Had recurrent fevers despite adherence to orita/dalba appointments 5/22: Blood cultures obtained at outpatient ID appointment  5/24-6/8: Cultures from 5/22 growing MSSA again, readmitted; CT chest redemonstrates septic pulmonary emboli; MRI thoracic spine shows possible T11 discitis  6/8-7/5: Discharged from [facility] to [facility] to complete 6 weeks cefazolin  7/5: Completed cefazolin, discharged from [facility] 7/5 on oral cefdinir; despite cefdinir, patient continued to have fevers  7/23: Seen in ER, for fever, weakness, dehdyration blood cultures with bacteremia but patient LWBS 7/24: Returned to ER admitted again  Remains in the hospital, cannot be discharged with indwelling IV line.       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  Will be seen by CT surgery but not likely an intervention candidate. 8/8, with pleuritic chest pain , cxr with some pleural effusion , repeat cultures remain negative.  ID added doxycycline for one week.   - was on  cefazolin and daptomycin.  ID changed to daptomycin monotherapy. - Plan for 6 weeks IV antibiotics from negative 7/26 blood cultures, until 9/6 - Followed by infectious disease. - has a PICC line.    Anxiety  -Started on Lexapro.  QTc is less than 500.  Weaning off Xanax.  Currently on 0.25 mg twice daily. -He has been using this even at outpatient so it will be difficult to discontinue altogether.  We will keep on low doses.   Prolonged QT interval QT interval 600 on admission.  Repeat ECGs showed QT down to 480 now.   Polysubstance abuse ([name]) Chronic pain syndrome/methadone abuse  - Continue methadone.  Denies current ongoing use of drugs.        Subjective: No new events.  Still using Phenergan.  Will check EKG tomorrow for QTc.  Physical Exam: Vitals:   03/13/22 1235 03/13/22 1652 03/13/22 2220 03/14/22 0500  BP: 123/85 114/75 111/61   Pulse: 83 98 96   Resp: 18 18 18    Temp:  98.3 F (36.8 C) 98.2 F (36.8 C)   TempSrc: Oral Oral Oral   SpO2:   92%   Weight:    82.5 kg  Height:       Comfortably laying in the bed. Data Reviewed: BMP and CBC.      Disposition: Status is: Inpatient Patient admitted with recurrent endocarditis.  ID recommend inaptient treatment until 9/6  Medically stable           Author: 11/6, MD 03/14/2022 3:40 PM

## 2022-03-15 DIAGNOSIS — A4101 Sepsis due to Methicillin susceptible Staphylococcus aureus: Secondary | ICD-10-CM | POA: Diagnosis not present

## 2022-03-15 LAB — BASIC METABOLIC PANEL
Anion gap: 7 (ref 5–15)
BUN: 10 mg/dL (ref 6–20)
CO2: 30 mmol/L (ref 22–32)
Calcium: 8.6 mg/dL — ABNORMAL LOW (ref 8.9–10.3)
Chloride: 101 mmol/L (ref 98–111)
Creatinine, Ser: 0.83 mg/dL (ref 0.61–1.24)
GFR, Estimated: >60 mL/min (ref >60)
Glucose, Bld: 114 mg/dL — ABNORMAL HIGH (ref 70–99)
Potassium: 3.8 mmol/L (ref 3.5–5.1)
Sodium: 138 mmol/L (ref 135–145)

## 2022-03-15 LAB — CBC
HCT: 24.5 % — ABNORMAL LOW (ref 39.0–52.0)
Hemoglobin: 7.7 g/dL — ABNORMAL LOW (ref 13.0–17.0)
MCH: 26.7 pg (ref 26.0–34.0)
MCHC: 31.4 g/dL (ref 30.0–36.0)
MCV: 85.1 fL (ref 80.0–100.0)
Platelets: 283 10*3/uL (ref 150–400)
RBC: 2.88 MIL/uL — ABNORMAL LOW (ref 4.22–5.81)
RDW: 16.4 % — ABNORMAL HIGH (ref 11.5–15.5)
WBC: 7.8 10*3/uL (ref 4.0–10.5)
nRBC: 0 % (ref 0.0–0.2)

## 2022-03-15 LAB — CK: Total CK: 10 U/L — ABNORMAL LOW (ref 49–397)

## 2022-03-15 NOTE — Progress Notes (Signed)
Progress Note   Patient: Reginald Clarke [id number] DOB: [birth year] DOA: 02/25/2022   hospital day   18 DOS: the patient was seen and examined on 03/15/2022     Brief hospital course: Reginald Clarke is a 31 y.o. M w/ hx IVDU with MSSA bacteremia, native tricuspid valve endocarditis starting Apr 2023 who is admitted for recurrent endocarditis. Chronology of events   4/8 Diagnosed with pneumonia, discharged from ED  4/12 Blood cultures from ED visit 4/8 growing MSSA, admitted to hospital 4/21 TEE showed large TV vegetation 4/26 Underwent angiovac by Dr. Cliffton Asters with residual subendocardial component 5/10 Discharged on oritavancin/dalbavancin   5/20-5/22: Had recurrent fevers despite adherence to orita/dalba appointments 5/22: Blood cultures obtained at outpatient ID appointment  5/24-6/8: Cultures from 5/22 growing MSSA again, readmitted; CT chest redemonstrates septic pulmonary emboli; MRI thoracic spine shows possible T11 discitis  6/8-7/5: Discharged from [facility] to [facility] to complete 6 weeks cefazolin  7/5: Completed cefazolin, discharged from [facility] 7/5 on oral cefdinir; despite cefdinir, patient continued to have fevers  7/23: Seen in ER, for fever, weakness, dehdyration blood cultures with bacteremia but patient LWBS 7/24: Returned to ER admitted again  Remains in the hospital, cannot be discharged with indwelling IV line.       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  Will be seen by CT surgery but not likely an intervention candidate. 8/8, with pleuritic chest pain , cxr with some pleural effusion , repeat cultures remain negative.  ID added doxycycline for one week.   - was on  cefazolin and daptomycin.  ID changed to daptomycin monotherapy. - Plan for 6 weeks IV antibiotics from negative 7/26 blood cultures, until 9/6 - Followed by infectious disease. - has a PICC line.    Anxiety  -Started on Lexapro.  QTc is less than 500.  Weaning off Xanax.  Currently on 0.25 mg twice daily.  -He has been using this even at outpatient so it will be difficult to discontinue altogether.  We will keep on low doses.   Prolonged QT interval QT interval 600 on admission.  Repeat ECGs showed QT down to 480 now.   Polysubstance abuse ([name]) Chronic pain syndrome/methadone abuse  - Continue methadone.  Denies current ongoing use of drugs.        Subjective: No new events.  Pleuritic chest pain is gone.  He is only using 1 dose of Xanax especially at night.  Using 1 dose of Phenergan after using methadone.  Feels overall better today.  Physical Exam: Vitals:   03/14/22 2035 03/14/22 2040 03/15/22 0841 03/15/22 1243  BP: 110/68  110/66 102/71  Pulse: 94  93 83  Resp: 16  16 16   Temp: 98 F (36.7 C)  97.7 F (36.5 C) 97.8 F (36.6 C)  TempSrc: Oral  Oral Oral  SpO2: 97% 96% 91% 94%  Weight:      Height:       Comfortably laying in the bed.  No new physical exam findings.  Data Reviewed: BMP and CBC.      Disposition: Status is: Inpatient Patient admitted with recurrent endocarditis.  ID recommend inaptient treatment until 9/6  Medically stable  Author: Dorcas Carrow, MD 03/15/2022 1:44 PM

## 2022-03-16 DIAGNOSIS — A4101 Sepsis due to Methicillin susceptible Staphylococcus aureus: Secondary | ICD-10-CM | POA: Diagnosis not present

## 2022-03-16 NOTE — Plan of Care (Signed)

## 2022-03-16 NOTE — Progress Notes (Signed)
  Progress Note   Patient: Reginald Clarke [id number] DOB: [birth year] DOA: 02/25/2022   hospital day   19 DOS: the patient was seen and examined on 03/16/2022     Brief hospital course: Reginald Clarke is a 31 y.o. M w/ hx IVDU with MSSA bacteremia, native tricuspid valve endocarditis starting Apr 2023 who is admitted for recurrent endocarditis. Chronology of events   4/8 Diagnosed with pneumonia, discharged from ED  4/12 Blood cultures from ED visit 4/8 growing MSSA, admitted to hospital 4/21 TEE showed large TV vegetation 4/26 Underwent angiovac by Dr. Cliffton Asters with residual subendocardial component 5/10 Discharged on oritavancin/dalbavancin   5/20-5/22: Had recurrent fevers despite adherence to orita/dalba appointments 5/22: Blood cultures obtained at outpatient ID appointment  5/24-6/8: Cultures from 5/22 growing MSSA again, readmitted; CT chest redemonstrates septic pulmonary emboli; MRI thoracic spine shows possible T11 discitis  6/8-7/5: Discharged from [facility] to [facility] to complete 6 weeks cefazolin  7/5: Completed cefazolin, discharged from [facility] 7/5 on oral cefdinir; despite cefdinir, patient continued to have fevers  7/23: Seen in ER, for fever, weakness, dehdyration blood cultures with bacteremia but patient LWBS 7/24: Returned to ER admitted again  Remains in the hospital, cannot be discharged with indwelling IV line.       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  Will be seen by CT surgery but not likely an intervention candidate. 8/8, with pleuritic chest pain , cxr with some pleural effusion , repeat cultures remain negative.  ID added doxycycline for one week.   - was on  cefazolin and daptomycin.  ID changed to daptomycin monotherapy. - Plan for 6 weeks IV antibiotics from negative 7/26 blood cultures, until 9/6 - Followed by infectious disease. - has a PICC line.    Anxiety  -Started on Lexapro.  QTc is less than 500.  Weaning off Xanax.  Currently on 0.25 mg twice daily.  -He has been using this even at outpatient so it will be difficult to discontinue altogether.  We will keep on low doses.   Prolonged QT interval QT interval 600 on admission.  Repeat ECGs showed QT down to 480 now.   Polysubstance abuse ([name]) Chronic pain syndrome/methadone abuse  - Continue methadone.  Denies current ongoing use of drugs.        Subjective: No new events.  No more pleuritic chest pain. Physical Exam: Vitals:   03/16/22 0006 03/16/22 0300 03/16/22 0835 03/16/22 1224  BP: (!) 121/52 111/65 128/81 132/84  Pulse: 75 99 98 99  Resp: 19 19 16 16   Temp: 98.4 F (36.9 C) 99.5 F (37.5 C) 97.8 F (36.6 C) 98.2 F (36.8 C)  TempSrc: Oral Oral Oral Oral  SpO2: 96% 96% 91% 93%  Weight:      Height:       Comfortably sleeping in the bed.  Data Reviewed: BMP and CBC.      Disposition: Status is: Inpatient Patient admitted with recurrent endocarditis.  ID recommend inaptient treatment until 9/6  Medically stable           Author: 11/6, MD 03/16/2022 1:49 PM

## 2022-03-16 NOTE — Plan of Care (Signed)
  Problem: Education: Goal: Knowledge of General Education information will improve Description: Including pain rating scale, medication(s)/side effects and non-pharmacologic comfort measures Outcome: Progressing   Problem: Health Behavior/Discharge Planning: Goal: Ability to manage health-related needs will improve Outcome: Progressing   Problem: Clinical Measurements: Goal: Ability to maintain clinical measurements within normal limits will improve Outcome: Progressing Goal: Will remain free from infection Outcome: Progressing Goal: Respiratory complications will improve Outcome: Progressing Goal: Cardiovascular complication will be avoided Outcome: Progressing   Problem: Activity: Goal: Risk for activity intolerance will decrease Outcome: Progressing   Problem: Nutrition: Goal: Adequate nutrition will be maintained Outcome: Progressing   Problem: Coping: Goal: Level of anxiety will decrease Outcome: Progressing   Problem: Elimination: Goal: Will not experience complications related to bowel motility Outcome: Progressing   Problem: Pain Managment: Goal: General experience of comfort will improve Outcome: Progressing   Problem: Safety: Goal: Ability to remain free from injury will improve Outcome: Progressing   Problem: Skin Integrity: Goal: Risk for impaired skin integrity will decrease Outcome: Progressing   

## 2022-03-17 DIAGNOSIS — A4101 Sepsis due to Methicillin susceptible Staphylococcus aureus: Secondary | ICD-10-CM | POA: Diagnosis not present

## 2022-03-17 NOTE — Progress Notes (Signed)
PROGRESS NOTE                                                                                                                                                                                                             Patient Demographics:    Reginald Clarke, is a 31 y.o. male, DOB - Nov 11, 1990, OZD:[id number]  Outpatient Primary MD for the patient is Allwardt, [name], PA-C    LOS - 20  Admit date - 02/25/2022    Chief Complaint  Patient presents with   Nausea   Emesis       Brief Narrative (HPI from H&P)  Reginald Clarke is a 31 y.o. M w/ hx IVDU with MSSA bacteremia, native tricuspid valve endocarditis starting Apr 2023 who is admitted for recurrent endocarditis.  Chronology of events    4/8 Diagnosed with pneumonia, discharged from ED   4/12 Blood cultures from ED visit 4/8 growing MSSA, admitted to hospital 4/21 TEE showed large TV vegetation 4/26 Underwent angiovac by Dr. Cliffton Asters with residual subendocardial component 5/10 Discharged on oritavancin/dalbavancin    5/20-5/22: Had recurrent fevers despite adherence to orita/dalba appointments 5/22: Blood cultures obtained at outpatient ID appointment   5/24-6/8: Cultures from 5/22 growing MSSA again, readmitted; CT chest redemonstrates septic pulmonary emboli; MRI thoracic spine shows possible T11 discitis   6/8-7/5: Discharged from [facility] to [facility] to complete 6 weeks cefazolin   7/5: Completed cefazolin, discharged from [facility] 7/5 on oral cefdinir; despite cefdinir, patient continued to have fevers   7/23: Seen in ER, for fever, weakness, dehdyration blood cultures with bacteremia but patient LWBS 7/24: Returned to ER admitted again   Remains in the hospital, cannot be discharged with indwelling IV line.  Was transferred to my service on 03/17/2022.   Subjective:    Reginald Neumann today has, No headache, No chest pain, No abdominal pain - No Nausea, No new  weakness tingling or numbness, No Cough - SOB.     Assessment  & Plan :    Sepsis, MSSA & MRSA Bacteremia with Tricuspid endocarditis.    MRSA bacteremia     Subacute bacterial endocarditis Bcx 7/23: MRSA and MSSA both in 1/1 (patient left ER triage) Bcx 7/24: MRSA and MSSA both in 1/1 (collected on readmission) Bcx 7/26: No growth Bcx 7/28: NGTD TEE 8/3: Severe tricuspid regurgitation with  multiple tricuspid vegetations.  Will be seen by CT surgery but not likely an intervention candidate. 8/8, with pleuritic chest pain , cxr with some pleural effusion , repeat cultures remain negative.   ID added doxycycline for one week.    - was on cefazolin and daptomycin.  ID changed to daptomycin monotherapy. - Plan for 6 weeks IV Daptomycin from negative 7/26 blood cultures, until 04/10/22 - Followed by infectious disease. - has a R. Arm PICC line.    Hyponatremia - Stable  Elevated serum creatinine - AKI ruled out.  Cr back to normal  Prolonged QT interval - QT interval 600 on admission.  Repeat ECGs showed QT down to 480 now. Has been restarted on escitalopram.  Monitor tele, repeat EKG 03/17/22   Anxiety - Discussed with patient, Xanax not acceptable long-term daily medication.  Will not d/c for now, as patient reports high anxiety during inpatient stay.  Continue PRN Xanax for now, Continue PRN hydroxyzine, Trazodone at night.  Hepatitis C, chronic ([name])  - Consult ID, appreciate recommendations post discharge follow-up with ID.  Normocytic anemia - No clinical bleeding   Polysubstance abuse ([name]), tobacco abuse.  Counseled to quit all. - Continue methadone  Tobacco abuse - Continue nicotine patch, counseled to quit.  Chronic pain syndrome/methadone abuse - Continue methadone has been told to arrange for outpatient methadone supplies postdischarge.        Condition - Fair  Family Communication  : Girlfriend bedside on 03/17/2022  Code Status : Full code  Consults  :  ID  PUD Prophylaxis :    Procedures  :            Disposition Plan  :    Status is: Inpatient  DVT Prophylaxis  :    enoxaparin (LOVENOX) injection 40 mg Start: 02/25/22 1700   Lab Results  Component Value Date   PLT 283 03/15/2022    Diet :  Diet Order             Diet regular Room service appropriate? Yes; Fluid consistency: Thin  Diet effective now                    Inpatient Medications  Scheduled Meds:  Chlorhexidine Gluconate Cloth  6 each Topical Daily   diclofenac Sodium  2 g Topical QID   doxycycline  100 mg Oral BID   enoxaparin (LOVENOX) injection  40 mg Subcutaneous Q24H   escitalopram  10 mg Oral Daily   feeding supplement  237 mL Oral BID BM   methadone  135 mg Oral Daily   multivitamin with minerals  1 tablet Oral Daily   nicotine  14 mg Transdermal Daily   sodium chloride flush  3 mL Intravenous Q12H   traZODone  50 mg Oral QHS   Continuous Infusions:  DAPTOmycin (CUBICIN) 750 mg in sodium chloride 0.9 % IVPB 750 mg (03/16/22 1956)   sodium chloride     PRN Meds:.acetaminophen, ALPRAZolam, alum & mag hydroxide-simeth, bisacodyl, docusate sodium, hydrOXYzine, methocarbamol, polyethylene glycol, promethazine, sodium chloride, sodium chloride flush  Antibiotics  :    Anti-infectives (From admission, onward)    Start     Dose/Rate Route Frequency Ordered Stop   03/12/22 1500  doxycycline (VIBRA-TABS) tablet 100 mg        100 mg Oral 2 times daily 03/12/22 1412 03/19/22 1959   03/09/22 2000  DAPTOmycin (CUBICIN) 750 mg in sodium chloride 0.9 % IVPB  10 mg/kg  74 kg 130 mL/hr over 30 Minutes Intravenous Daily 03/09/22 1036 04/11/22 1959   02/28/22 1245  DAPTOmycin (CUBICIN) 600 mg in sodium chloride 0.9 % IVPB  Status:  Discontinued        8 mg/kg  74 kg 124 mL/hr over 30 Minutes Intravenous Daily 02/28/22 1145 03/09/22 1036   02/25/22 1745  ceFAZolin (ANCEF) IVPB 2g/100 mL premix  Status:  Discontinued        2 g 200 mL/hr  over 30 Minutes Intravenous Every 8 hours 02/25/22 1734 03/09/22 1036        Time Spent in minutes  30   Susa Raring M.D on 03/17/2022 at 9:13 AM  To page go to [email or web address]   [facility] -  Office  [phone]  See all Orders from today for further details    Objective:   Vitals:   03/16/22 2304 03/17/22 0329 03/17/22 0444 03/17/22 0827  BP: 106/62 111/81  121/89  Pulse: 98 90  99  Resp: 18 18  16   Temp: 98.2 F (36.8 C) 98.1 F (36.7 C)  97.8 F (36.6 C)  TempSrc: Oral Oral  Oral  SpO2: 95% 95%  93%  Weight:   77 kg   Height:        Wt Readings from Last 3 Encounters:  03/17/22 77 kg  02/24/22 74.4 kg  02/22/22 74.8 kg     Intake/Output Summary (Last 24 hours) at 03/17/2022 0913 Last data filed at 03/16/2022 1554 Gross per 24 hour  Intake 260 ml  Output --  Net 260 ml     Physical Exam  Awake Alert, No new F.N deficits, Normal affect [place].AT,PERRAL Supple Neck, No JVD,   Symmetrical Chest wall movement, Good air movement bilaterally, CTAB RRR,No Gallops,Rubs or new Murmurs,  +ve B.Sounds, Abd Soft, No tenderness,   No Cyanosis, Clubbing or edema      Data Review:    CBC Recent Labs  Lab 03/15/22 0500  WBC 7.8  HGB 7.7*  HCT 24.5*  PLT 283  MCV 85.1  MCH 26.7  MCHC 31.4  RDW 16.4*    Electrolytes Recent Labs  Lab 03/15/22 0500  NA 138  K 3.8  CL 101  CO2 30  GLUCOSE 114*  BUN 10  CREATININE 0.83  CALCIUM 8.6*    ------------------------------------------------------------------------------------------------------------------ No results for input(s): "CHOL", "HDL", "LDLCALC", "TRIG", "CHOLHDL", "LDLDIRECT" in the last 72 hours.  No results found for: "HGBA1C"  No results for input(s): "TSH", "T4TOTAL", "T3FREE", "THYROIDAB" in the last 72 hours.  Invalid input(s): "FREET3" ------------------------------------------------------------------------------------------------------------------ ID Labs Recent Labs   Lab 03/15/22 0500  WBC 7.8  PLT 283  CREATININE 0.83   Cardiac Enzymes No results for input(s): "CKMB", "TROPONINI", "MYOGLOBIN" in the last 168 hours.  Invalid input(s): "CK"   Micro Results No results found for this or any previous visit (from the past 240 hour(s)).  Radiology Reports No results found.

## 2022-03-18 DIAGNOSIS — A4101 Sepsis due to Methicillin susceptible Staphylococcus aureus: Secondary | ICD-10-CM | POA: Diagnosis not present

## 2022-03-18 LAB — CBC WITH DIFFERENTIAL/PLATELET
Abs Immature Granulocytes: 0.04 10*3/uL (ref 0.00–0.07)
Basophils Absolute: 0 10*3/uL (ref 0.0–0.1)
Basophils Relative: 1 %
Eosinophils Absolute: 0.2 10*3/uL (ref 0.0–0.5)
Eosinophils Relative: 2 %
HCT: 25.6 % — ABNORMAL LOW (ref 39.0–52.0)
Hemoglobin: 8 g/dL — ABNORMAL LOW (ref 13.0–17.0)
Immature Granulocytes: 1 %
Lymphocytes Relative: 31 %
Lymphs Abs: 2.3 10*3/uL (ref 0.7–4.0)
MCH: 26.6 pg (ref 26.0–34.0)
MCHC: 31.3 g/dL (ref 30.0–36.0)
MCV: 85 fL (ref 80.0–100.0)
Monocytes Absolute: 0.6 10*3/uL (ref 0.1–1.0)
Monocytes Relative: 8 %
Neutro Abs: 4.4 10*3/uL (ref 1.7–7.7)
Neutrophils Relative %: 57 %
Platelets: 320 10*3/uL (ref 150–400)
RBC: 3.01 MIL/uL — ABNORMAL LOW (ref 4.22–5.81)
RDW: 16.3 % — ABNORMAL HIGH (ref 11.5–15.5)
WBC: 7.5 10*3/uL (ref 4.0–10.5)
nRBC: 0 % (ref 0.0–0.2)

## 2022-03-18 LAB — COMPREHENSIVE METABOLIC PANEL
ALT: 9 U/L (ref 0–44)
AST: 12 U/L — ABNORMAL LOW (ref 15–41)
Albumin: 2.3 g/dL — ABNORMAL LOW (ref 3.5–5.0)
Alkaline Phosphatase: 55 U/L (ref 38–126)
Anion gap: 7 (ref 5–15)
BUN: 19 mg/dL (ref 6–20)
CO2: 28 mmol/L (ref 22–32)
Calcium: 8.9 mg/dL (ref 8.9–10.3)
Chloride: 102 mmol/L (ref 98–111)
Creatinine, Ser: 0.89 mg/dL (ref 0.61–1.24)
GFR, Estimated: >60 mL/min (ref >60)
Glucose, Bld: 93 mg/dL (ref 70–99)
Potassium: 4.4 mmol/L (ref 3.5–5.1)
Sodium: 137 mmol/L (ref 135–145)
Total Bilirubin: 0.4 mg/dL (ref 0.3–1.2)
Total Protein: 6.8 g/dL (ref 6.5–8.1)

## 2022-03-18 LAB — MAGNESIUM: Magnesium: 1.9 mg/dL (ref 1.7–2.4)

## 2022-03-18 NOTE — Progress Notes (Signed)
Nutrition Follow-up  DOCUMENTATION CODES:   Not applicable  INTERVENTION:   Continue Multivitamin w/ minerals daily Continue Ensure Enlive po BID, each supplement provides 350 kcal and 20 grams of protein. Continue Snacks daily  NUTRITION DIAGNOSIS:   Inadequate oral intake related to vomiting, nausea as evidenced by per patient/family report. 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 Pt reports recent poor PO with nausea, vomiting, and diarrhea. Hx complicated by endocarditis which pt recently finished antibiotics for <1 week ago.  Pt laying in bed at time of RD visit. Reports that his appetite has been improving and his PO intake is better. States that he is drinking the Ensure's and would like to continue them. States that he has received his snack a few times, RD reminded pt that he just needs to ask for it. Pt is ok with current snack order and did not want to change. PT with no other questions or concerns.   Discussed case with RN outside of room. RN reports that pt is eating well and that he has been receiving Phenergan in the morning and that has improved his nausea.   Medications reviewed and include: Doxycycline, MVI, IV antibiotics, Phenergan (PRN) Labs reviewed.  Diet Order:   Diet Order             Diet regular Room service appropriate? Yes; Fluid consistency: Thin  Diet effective now                   EDUCATION NEEDS:   No education needs have been identified at this time  Skin:  Skin Assessment: Reviewed RN Assessment  Last BM:  8/13 - per pt  Height:   Ht Readings from Last 1 Encounters:   02/25/22 5\' 10"  (1.778 m)    Weight:   Wt Readings from Last 1 Encounters:  03/18/22 77.8 kg    Ideal Body Weight:  75.5 kg  BMI:  Body mass index is 24.61 kg/m.  Estimated Nutritional Needs:   Kcal:  2300-2500 kcal/d  Protein:  115-130g/d  Fluid:  >/=2.3L/d    03/20/22 RD, LDN Clinical Dietitian See [facility] for contact information.

## 2022-03-18 NOTE — Progress Notes (Signed)
PROGRESS NOTE                                                                                                                                                                                                             Patient Demographics:    Reginald Clarke, is a 31 y.o. male, DOB - 05/07/1991, [id number]  Outpatient Primary MD for the patient is Allwardt, Randa Evens, PA-C    LOS - 21  Admit date - 02/25/2022    Chief Complaint  Patient presents with   Nausea   Emesis       Brief Narrative (HPI from H&P)  Reginald Clarke is a 31 y.o. M w/ hx IVDU with MSSA bacteremia, native tricuspid valve endocarditis starting Apr 2023 who is admitted for recurrent endocarditis.  Chronology of events    4/8 Diagnosed with pneumonia, discharged from ED   4/12 Blood cultures from ED visit 4/8 growing MSSA, admitted to hospital 4/21 TEE showed large TV vegetation 4/26 Underwent angiovac by Dr. Kipp Brood with residual subendocardial component 5/10 Discharged on oritavancin/dalbavancin    5/20-5/22: Had recurrent fevers despite adherence to orita/dalba appointments 5/22: Blood cultures obtained at outpatient ID appointment   5/24-6/8: Cultures from 5/22 growing MSSA again, readmitted; CT chest redemonstrates septic pulmonary emboli; MRI thoracic spine shows possible T11 discitis   6/8-7/5: Discharged from [facility] to [place] to complete 6 weeks cefazolin   7/5: Completed cefazolin, discharged from [place] 7/5 on oral cefdinir; despite cefdinir, patient continued to have fevers   7/23: Seen in ER, for fever, weakness, dehdyration blood cultures with bacteremia but patient LWBS 7/24: Returned to ER admitted again   Remains in the hospital, cannot be discharged with indwelling IV line.  Was transferred to my service on 03/17/2022.   Subjective:   Patient in bed, appears comfortable, denies any headache, no fever, no chest pain or pressure,  no shortness of breath , no abdominal pain. No new focal weakness.   Assessment  & Plan :    Sepsis, MSSA & MRSA Bacteremia with Tricuspid endocarditis.   Subacute bacterial endocarditis Bcx 7/23: MRSA and MSSA both in 1/1 (patient left ER triage) Bcx 7/24: MRSA and MSSA both in 1/1 (collected on readmission) Bcx 7/26: No growth Bcx 7/28: NGTD TEE 8/3: Severe tricuspid regurgitation with multiple tricuspid vegetations.  Will be seen by  CT surgery but not likely an intervention candidate. 8/8, with pleuritic chest pain , cxr with some pleural effusion , repeat cultures remain negative.   ID added doxycycline for one week.    - was on cefazolin and daptomycin.  ID changed to daptomycin monotherapy. - Plan for 6 weeks IV Daptomycin from negative 7/26 blood cultures, stop date 04/10/22, Followed by infectious disease, has a R. Arm PICC line.    Prolonged QT interval - QT interval 600 on admission.  Repeat ECGs showed QT down to 480 now. Has been restarted on escitalopram.  Monitor tele, repeat EKG 03/17/22 - stable QTc  Anxiety - Discussed with patient, Xanax not acceptable long-term daily medication.  Will not d/c for now, as patient reports high anxiety during inpatient stay.  Continue PRN Xanax for now, Continue PRN hydroxyzine, Trazodone at night.  Hepatitis C, chronic ([name])  - Consult ID, appreciate recommendations post discharge follow-up with ID.  Normocytic anemia - No clinical bleeding   Polysubstance abuse ([name]), tobacco abuse.  Counseled to quit all. - Continue methadone  Tobacco abuse - Continue nicotine patch, counseled to quit.  Chronic pain syndrome/methadone abuse - Continue methadone has been told to arrange for outpatient methadone supplies postdischarge.        Condition - Fair  Family Communication  : Girlfriend bedside on 03/17/2022  Code Status : Full code  Consults  : ID  PUD Prophylaxis :    Procedures  :            Disposition Plan  :    Status  is: Inpatient  DVT Prophylaxis  :    enoxaparin (LOVENOX) injection 40 mg Start: 02/25/22 1700   Lab Results  Component Value Date   PLT 320 03/18/2022    Diet :  Diet Order             Diet regular Room service appropriate? Yes; Fluid consistency: Thin  Diet effective now                    Inpatient Medications  Scheduled Meds:  Chlorhexidine Gluconate Cloth  6 each Topical Daily   diclofenac Sodium  2 g Topical QID   doxycycline  100 mg Oral BID   enoxaparin (LOVENOX) injection  40 mg Subcutaneous Q24H   escitalopram  10 mg Oral Daily   feeding supplement  237 mL Oral BID BM   methadone  135 mg Oral Daily   multivitamin with minerals  1 tablet Oral Daily   nicotine  14 mg Transdermal Daily   sodium chloride flush  3 mL Intravenous Q12H   traZODone  50 mg Oral QHS   Continuous Infusions:  DAPTOmycin (CUBICIN) 750 mg in sodium chloride 0.9 % IVPB 750 mg (03/17/22 2024)   sodium chloride     PRN Meds:.acetaminophen, ALPRAZolam, alum & mag hydroxide-simeth, bisacodyl, docusate sodium, hydrOXYzine, methocarbamol, polyethylene glycol, promethazine, sodium chloride, sodium chloride flush  Time Spent in minutes  30   Susa Raring M.D on 03/18/2022 at 8:30 AM  To page go to [email or web address]   [facility] -  Office  [phone]  See all Orders from today for further details    Objective:   Vitals:   03/17/22 1954 03/17/22 2306 03/18/22 0335 03/18/22 0415  BP: 110/69 111/67 107/65   Pulse: 92 77 84   Resp: 18 18 18    Temp: 98.1 F (36.7 C) 98.1 F (36.7 C) 98.2 F (36.8 C)   TempSrc: Oral Oral Oral  SpO2: 94% 95% 95%   Weight:    77.8 kg  Height:        Wt Readings from Last 3 Encounters:  03/18/22 77.8 kg  02/24/22 74.4 kg  02/22/22 74.8 kg    No intake or output data in the 24 hours ending 03/18/22 0830  Physical Exam  Awake Alert, No new F.N deficits, Normal affect .AT,PERRAL Supple Neck, No JVD,   Symmetrical Chest wall  movement, Good air movement bilaterally, CTAB RRR,No Gallops, Rubs or new Murmurs,  +ve B.Sounds, Abd Soft, No tenderness,   No Cyanosis, Clubbing or edema     Data Review:    CBC Recent Labs  Lab 03/15/22 0500 03/18/22 0425  WBC 7.8 7.5  HGB 7.7* 8.0*  HCT 24.5* 25.6*  PLT 283 320  MCV 85.1 85.0  MCH 26.7 26.6  MCHC 31.4 31.3  RDW 16.4* 16.3*  LYMPHSABS  --  2.3  MONOABS  --  0.6  EOSABS  --  0.2  BASOSABS  --  0.0    Electrolytes Recent Labs  Lab 03/15/22 0500 03/18/22 0425  NA 138 137  K 3.8 4.4  CL 101 102  CO2 30 28  GLUCOSE 114* 93  BUN 10 19  CREATININE 0.83 0.89  CALCIUM 8.6* 8.9  AST  --  12*  ALT  --  9  ALKPHOS  --  55  BILITOT  --  0.4  ALBUMIN  --  2.3*  MG  --  1.9    Micro Results No results found for this or any previous visit (from the past 240 hour(s)).  Radiology Reports No results found.

## 2022-03-18 NOTE — TOC Progression Note (Signed)
Transition of Care [facility]) - Progression Note    Patient Details  Name: Reginald Clarke MRN: [id number] Date of Birth: Jul 11, 1991  [facility] CM/SW Contact  Harriet Masson, RN Phone Number: 03/18/2022, 4:22 PM  Clinical Narrative:     [facility] contacted this RNCM to get patient to sign release form and choose another methadone clinic due to [facility] closing this month. Patient choose [facility]. Information emailed to [employer]. Jodi Mourning stated when patient discharges RNCM needs to fax DC summary and packet (that will be emailed to this [facility] and then placed on patient chart) to [facility]. Fax # [phone] TOC will continue to follow for needs. Expected Discharge Plan: Home/Self Care Barriers to Discharge: Continued Medical Work up  Expected Discharge Plan and Services Expected Discharge Plan: Home/Self Care   Discharge Planning Services: CM Consult   Living arrangements for the past 2 months: Single Family Home                 DME Arranged: N/A         HH Arranged: NA           Social Determinants of Health (SDOH) Interventions    Readmission Risk Interventions    02/26/2022   10:07 AM  Readmission Risk Prevention Plan  Transportation Screening Complete  PCP or Specialist Appt within 3-5 Days Complete  HRI or Home Care Consult Complete  Palliative Care Screening Not Applicable

## 2022-03-19 DIAGNOSIS — A4101 Sepsis due to Methicillin susceptible Staphylococcus aureus: Secondary | ICD-10-CM | POA: Diagnosis not present

## 2022-03-19 LAB — COMPREHENSIVE METABOLIC PANEL
ALT: 9 U/L (ref 0–44)
AST: 13 U/L — ABNORMAL LOW (ref 15–41)
Albumin: 2.5 g/dL — ABNORMAL LOW (ref 3.5–5.0)
Alkaline Phosphatase: 57 U/L (ref 38–126)
Anion gap: 7 (ref 5–15)
BUN: 21 mg/dL — ABNORMAL HIGH (ref 6–20)
CO2: 28 mmol/L (ref 22–32)
Calcium: 9.4 mg/dL (ref 8.9–10.3)
Chloride: 102 mmol/L (ref 98–111)
Creatinine, Ser: 0.83 mg/dL (ref 0.61–1.24)
GFR, Estimated: >60 mL/min (ref >60)
Glucose, Bld: 89 mg/dL (ref 70–99)
Potassium: 4.5 mmol/L (ref 3.5–5.1)
Sodium: 137 mmol/L (ref 135–145)
Total Bilirubin: 0.2 mg/dL — ABNORMAL LOW (ref 0.3–1.2)
Total Protein: 7.3 g/dL (ref 6.5–8.1)

## 2022-03-19 LAB — CBC WITH DIFFERENTIAL/PLATELET
Abs Immature Granulocytes: 0.04 10*3/uL (ref 0.00–0.07)
Basophils Absolute: 0 10*3/uL (ref 0.0–0.1)
Basophils Relative: 0 %
Eosinophils Absolute: 0.2 10*3/uL (ref 0.0–0.5)
Eosinophils Relative: 3 %
HCT: 27 % — ABNORMAL LOW (ref 39.0–52.0)
Hemoglobin: 8.5 g/dL — ABNORMAL LOW (ref 13.0–17.0)
Immature Granulocytes: 1 %
Lymphocytes Relative: 30 %
Lymphs Abs: 2.4 10*3/uL (ref 0.7–4.0)
MCH: 26.5 pg (ref 26.0–34.0)
MCHC: 31.5 g/dL (ref 30.0–36.0)
MCV: 84.1 fL (ref 80.0–100.0)
Monocytes Absolute: 0.6 10*3/uL (ref 0.1–1.0)
Monocytes Relative: 8 %
Neutro Abs: 4.7 10*3/uL (ref 1.7–7.7)
Neutrophils Relative %: 58 %
Platelets: 348 10*3/uL (ref 150–400)
RBC: 3.21 MIL/uL — ABNORMAL LOW (ref 4.22–5.81)
RDW: 16.3 % — ABNORMAL HIGH (ref 11.5–15.5)
WBC: 7.9 10*3/uL (ref 4.0–10.5)
nRBC: 0 % (ref 0.0–0.2)

## 2022-03-19 LAB — MAGNESIUM: Magnesium: 2 mg/dL (ref 1.7–2.4)

## 2022-03-19 NOTE — Progress Notes (Signed)
PROGRESS NOTE                                                                                                                                                                                                             Patient Demographics:    Reginald Clarke, is a 31 y.o. male, DOB - 10-09-1990, RUE:[id number]  Outpatient Primary MD for the patient is Allwardt, [name], PA-C    LOS - 22  Admit date - 02/25/2022    Chief Complaint  Patient presents with   Nausea   Emesis       Brief Narrative (HPI from H&P)  Reginald Clarke is a 31 y.o. M w/ hx IVDU with MSSA bacteremia, native tricuspid valve endocarditis starting Apr 2023 who is admitted for recurrent endocarditis.  Chronology of events    4/8 Diagnosed with pneumonia, discharged from ED   4/12 Blood cultures from ED visit 4/8 growing MSSA, admitted to hospital 4/21 TEE showed large TV vegetation 4/26 Underwent angiovac by Dr. Cliffton Asters with residual subendocardial component 5/10 Discharged on oritavancin/dalbavancin    5/20-5/22: Had recurrent fevers despite adherence to orita/dalba appointments 5/22: Blood cultures obtained at outpatient ID appointment   5/24-6/8: Cultures from 5/22 growing MSSA again, readmitted; CT chest redemonstrates septic pulmonary emboli; MRI thoracic spine shows possible T11 discitis   6/8-7/5: Discharged from [facility] to [facility] to complete 6 weeks cefazolin   7/5: Completed cefazolin, discharged from [facility] 7/5 on oral cefdinir; despite cefdinir, patient continued to have fevers   7/23: Seen in ER, for fever, weakness, dehdyration blood cultures with bacteremia but patient LWBS 7/24: Returned to ER admitted again   Remains in the hospital, cannot be discharged with indwelling IV line.  Was transferred to my service on 03/17/2022.   Subjective:   Patient in bed, appears comfortable, denies any headache, no fever, no chest pain or pressure,  no shortness of breath , no abdominal pain. No new focal weakness.   Assessment  & Plan :    Sepsis, MSSA & MRSA Bacteremia with Tricuspid endocarditis.   Subacute bacterial endocarditis Bcx 7/23: MRSA and MSSA both in 1/1 (patient left ER triage) Bcx 7/24: MRSA and MSSA both in 1/1 (collected on readmission) Bcx 7/26: No growth Bcx 7/28: NGTD TEE 8/3: Severe tricuspid regurgitation with multiple tricuspid vegetations.  Will be seen by  CT surgery but not likely an intervention candidate. 8/8, with pleuritic chest pain , cxr with some pleural effusion , repeat cultures remain negative.   ID added doxycycline for one week.    - was on cefazolin and daptomycin.  ID changed to daptomycin monotherapy. - Plan for 6 weeks IV Daptomycin from negative 7/26 blood cultures, stop date 04/10/22, Followed by infectious disease, has a R. Arm PICC line.    Prolonged QT interval - QT interval 600 on admission.  Repeat ECGs showed QT down to 480 now. Has been restarted on escitalopram.  Monitor tele, repeat EKG 03/17/22 - stable QTc  Anxiety - Discussed with patient, Xanax not acceptable long-term daily medication.  Will not d/c for now, as patient reports high anxiety during inpatient stay.  Continue PRN Xanax for now, Continue PRN hydroxyzine, Trazodone at night.  Hepatitis C, chronic ([name])  - Consult ID, appreciate recommendations post discharge follow-up with ID.  Normocytic anemia - No clinical bleeding   Polysubstance abuse ([name]), tobacco abuse.  Counseled to quit all. - Continue methadone  Tobacco abuse - Continue nicotine patch, counseled to quit.  Chronic pain syndrome/methadone abuse - Continue methadone has been told to arrange for outpatient methadone supplies postdischarge.        Condition - Fair  Family Communication  : Girlfriend bedside on 03/17/2022  Code Status : Full code  Consults  : ID  PUD Prophylaxis :    Procedures  :            Disposition Plan  :    Status  is: Inpatient  DVT Prophylaxis  :    enoxaparin (LOVENOX) injection 40 mg Start: 02/25/22 1700   Lab Results  Component Value Date   PLT 348 03/19/2022    Diet :  Diet Order             Diet regular Room service appropriate? Yes; Fluid consistency: Thin  Diet effective now                    Inpatient Medications  Scheduled Meds:  Chlorhexidine Gluconate Cloth  6 each Topical Daily   diclofenac Sodium  2 g Topical QID   enoxaparin (LOVENOX) injection  40 mg Subcutaneous Q24H   escitalopram  10 mg Oral Daily   feeding supplement  237 mL Oral BID BM   methadone  135 mg Oral Daily   multivitamin with minerals  1 tablet Oral Daily   nicotine  14 mg Transdermal Daily   sodium chloride flush  3 mL Intravenous Q12H   traZODone  50 mg Oral QHS   Continuous Infusions:  DAPTOmycin (CUBICIN) 750 mg in sodium chloride 0.9 % IVPB 750 mg (03/18/22 2048)   sodium chloride     PRN Meds:.acetaminophen, ALPRAZolam, alum & mag hydroxide-simeth, bisacodyl, docusate sodium, hydrOXYzine, methocarbamol, polyethylene glycol, promethazine, sodium chloride, sodium chloride flush  Time Spent in minutes  30   Susa Raring M.D on 03/19/2022 at 9:26 AM  To page go to [email or web address]   [facility] -  Office  [phone]  See all Orders from today for further details    Objective:   Vitals:   03/17/22 2306 03/18/22 0335 03/18/22 0415 03/18/22 1935  BP: 111/67 107/65  116/81  Pulse: 77 84  88  Resp: 18 18    Temp: 98.1 F (36.7 C) 98.2 F (36.8 C)  97.9 F (36.6 C)  TempSrc: Oral Oral  Oral  SpO2: 95% 95%  94%  Weight:   77.8 kg   Height:        Wt Readings from Last 3 Encounters:  03/18/22 77.8 kg  02/24/22 74.4 kg  02/22/22 74.8 kg    No intake or output data in the 24 hours ending 03/19/22 0926  Physical Exam  Awake Alert, No new F.N deficits, Normal affect [place].AT,PERRAL Supple Neck, No JVD,   Symmetrical Chest wall movement, Good air movement  bilaterally, CTAB RRR,No Gallops, Rubs or new Murmurs,  +ve B.Sounds, Abd Soft, No tenderness,   No Cyanosis, Clubbing or edema     Data Review:    CBC Recent Labs  Lab 03/15/22 0500 03/18/22 0425 03/19/22 0345  WBC 7.8 7.5 7.9  HGB 7.7* 8.0* 8.5*  HCT 24.5* 25.6* 27.0*  PLT 283 320 348  MCV 85.1 85.0 84.1  MCH 26.7 26.6 26.5  MCHC 31.4 31.3 31.5  RDW 16.4* 16.3* 16.3*  LYMPHSABS  --  2.3 2.4  MONOABS  --  0.6 0.6  EOSABS  --  0.2 0.2  BASOSABS  --  0.0 0.0    Electrolytes Recent Labs  Lab 03/15/22 0500 03/18/22 0425 03/19/22 0345  NA 138 137 137  K 3.8 4.4 4.5  CL 101 102 102  CO2 30 28 28   GLUCOSE 114* 93 89  BUN 10 19 21*  CREATININE 0.83 0.89 0.83  CALCIUM 8.6* 8.9 9.4  AST  --  12* 13*  ALT  --  9 9  ALKPHOS  --  55 57  BILITOT  --  0.4 0.2*  ALBUMIN  --  2.3* 2.5*  MG  --  1.9 2.0    Micro Results No results found for this or any previous visit (from the past 240 hour(s)).  Radiology Reports No results found.

## 2022-03-20 DIAGNOSIS — A4101 Sepsis due to Methicillin susceptible Staphylococcus aureus: Secondary | ICD-10-CM | POA: Diagnosis not present

## 2022-03-20 NOTE — Progress Notes (Signed)
PROGRESS NOTE                                                                                                                                                                                                             Patient Demographics:    Reginald Clarke, is a 31 y.o. male, DOB - 1990-10-09, OIZ:[id number]  Outpatient Primary MD for the patient is Allwardt, [name], PA-C    LOS - 23  Admit date - 02/25/2022    Chief Complaint  Patient presents with   Nausea   Emesis       Brief Narrative (HPI from H&P)  Reginald Clarke is a 31 y.o. M w/ hx IVDU with MSSA bacteremia, native tricuspid valve endocarditis starting Apr 2023 who is admitted for recurrent endocarditis.  Chronology of events    4/8 Diagnosed with pneumonia, discharged from ED   4/12 Blood cultures from ED visit 4/8 growing MSSA, admitted to hospital 4/21 TEE showed large TV vegetation 4/26 Underwent angiovac by Dr. Cliffton Asters with residual subendocardial component 5/10 Discharged on oritavancin/dalbavancin    5/20-5/22: Had recurrent fevers despite adherence to orita/dalba appointments 5/22: Blood cultures obtained at outpatient ID appointment   5/24-6/8: Cultures from 5/22 growing MSSA again, readmitted; CT chest redemonstrates septic pulmonary emboli; MRI thoracic spine shows possible T11 discitis   6/8-7/5: Discharged from [facility] to [facility] to complete 6 weeks cefazolin   7/5: Completed cefazolin, discharged from [facility] 7/5 on oral cefdinir; despite cefdinir, patient continued to have fevers   7/23: Seen in ER, for fever, weakness, dehdyration blood cultures with bacteremia but patient LWBS 7/24: Returned to ER admitted again   Remains in the hospital, cannot be discharged with indwelling IV line.  Was transferred to my service on 03/17/2022.   Subjective:   Patient in bed, appears comfortable, denies any headache, no fever, no chest pain or pressure,  no shortness of breath , no abdominal pain. No new focal weakness.  Assessment  & Plan :    Sepsis, MSSA & MRSA Bacteremia with Tricuspid endocarditis.  Bcx 7/23: MRSA and MSSA both in 1/1 (patient left ER triage) Bcx 7/24: MRSA and MSSA both in 1/1 (collected on readmission) Bcx 7/26: No growth Bcx 7/28: NGTD TEE 8/3: Severe tricuspid regurgitation with multiple tricuspid vegetations.  Will be seen by CT surgery but not likely  an intervention candidate. 8/8, with pleuritic chest pain , cxr with some pleural effusion , repeat cultures remain negative.   ID added doxycycline for one week, finished the course. He was on cefazolin and daptomycin.  ID changed to daptomycin monotherapy. Plan is for 6 weeks IV Daptomycin from negative 7/26 blood cultures, stop date 04/10/22, Followed by infectious disease, has a R. Arm PICC line.    Prolonged QT interval - QT interval 600 on admission.  Repeat ECGs showed QT down to 480 now. Has been restarted on escitalopram.  Monitor tele, repeat EKG 03/17/22 - stable QTc  Anxiety - Discussed with patient, Xanax not acceptable long-term daily medication.  Will not d/c for now, as patient reports high anxiety during inpatient stay.  Continue PRN Xanax for now, Continue PRN hydroxyzine, Trazodone at night.  Hepatitis C, chronic ([name])  - Consult ID, appreciate recommendations post discharge follow-up with ID.  Normocytic anemia - No clinical bleeding   Polysubstance abuse ([name]), tobacco abuse.  Counseled to quit all. - Continue methadone  Tobacco abuse - Continue nicotine patch, counseled to quit.  Chronic pain syndrome/methadone abuse - Continue methadone has been told to arrange for outpatient methadone supplies postdischarge.        Condition - Fair  Family Communication  : Girlfriend bedside on 03/17/2022  Code Status : Full code  Consults  : ID  PUD Prophylaxis :    Procedures  :            Disposition Plan  :    Status is:  Inpatient  DVT Prophylaxis  :    enoxaparin (LOVENOX) injection 40 mg Start: 02/25/22 1700   Lab Results  Component Value Date   PLT 348 03/19/2022    Diet :  Diet Order             Diet regular Room service appropriate? Yes; Fluid consistency: Thin  Diet effective now                    Inpatient Medications  Scheduled Meds:  Chlorhexidine Gluconate Cloth  6 each Topical Daily   diclofenac Sodium  2 g Topical QID   enoxaparin (LOVENOX) injection  40 mg Subcutaneous Q24H   escitalopram  10 mg Oral Daily   feeding supplement  237 mL Oral BID BM   methadone  135 mg Oral Daily   multivitamin with minerals  1 tablet Oral Daily   nicotine  14 mg Transdermal Daily   sodium chloride flush  3 mL Intravenous Q12H   traZODone  50 mg Oral QHS   Continuous Infusions:  DAPTOmycin (CUBICIN) 750 mg in sodium chloride 0.9 % IVPB 750 mg (03/19/22 2010)   sodium chloride     PRN Meds:.acetaminophen, ALPRAZolam, alum & mag hydroxide-simeth, bisacodyl, docusate sodium, hydrOXYzine, methocarbamol, polyethylene glycol, promethazine, sodium chloride, sodium chloride flush  Time Spent in minutes  30   Susa Raring M.D on 03/20/2022 at 9:55 AM  To page go to [email or web address]   [facility] -  Office  [phone]  See all Orders from today for further details    Objective:   Vitals:   03/18/22 1935 03/19/22 2025 03/20/22 0343 03/20/22 0854  BP: 116/81 94/61 99/62  93/62  Pulse: 88 87 88 80  Resp:  18 18 18   Temp: 97.9 F (36.6 C) 97.9 F (36.6 C) 97.8 F (36.6 C) 98 F (36.7 C)  TempSrc: Oral Oral Oral Oral  SpO2: 94% 94% 96%   Weight:  76.4 kg   Height:        Wt Readings from Last 3 Encounters:  03/20/22 76.4 kg  02/24/22 74.4 kg  02/22/22 74.8 kg    No intake or output data in the 24 hours ending 03/20/22 0955  Physical Exam  Awake Alert, No new F.N deficits, Normal affect [place].AT,PERRAL Supple Neck, No JVD,   Symmetrical Chest wall movement, Good  air movement bilaterally, CTAB RRR,No Gallops, Rubs or new Murmurs,  +ve B.Sounds, Abd Soft, No tenderness,   No Cyanosis, Clubbing or edema     Data Review:    CBC Recent Labs  Lab 03/15/22 0500 03/18/22 0425 03/19/22 0345  WBC 7.8 7.5 7.9  HGB 7.7* 8.0* 8.5*  HCT 24.5* 25.6* 27.0*  PLT 283 320 348  MCV 85.1 85.0 84.1  MCH 26.7 26.6 26.5  MCHC 31.4 31.3 31.5  RDW 16.4* 16.3* 16.3*  LYMPHSABS  --  2.3 2.4  MONOABS  --  0.6 0.6  EOSABS  --  0.2 0.2  BASOSABS  --  0.0 0.0    Electrolytes Recent Labs  Lab 03/15/22 0500 03/18/22 0425 03/19/22 0345  NA 138 137 137  K 3.8 4.4 4.5  CL 101 102 102  CO2 30 28 28   GLUCOSE 114* 93 89  BUN 10 19 21*  CREATININE 0.83 0.89 0.83  CALCIUM 8.6* 8.9 9.4  AST  --  12* 13*  ALT  --  9 9  ALKPHOS  --  55 57  BILITOT  --  0.4 0.2*  ALBUMIN  --  2.3* 2.5*  MG  --  1.9 2.0    Micro Results No results found for this or any previous visit (from the past 240 hour(s)).  Radiology Reports No results found.

## 2022-03-21 DIAGNOSIS — A4101 Sepsis due to Methicillin susceptible Staphylococcus aureus: Secondary | ICD-10-CM | POA: Diagnosis not present

## 2022-03-21 LAB — COMPREHENSIVE METABOLIC PANEL
ALT: 9 U/L (ref 0–44)
AST: 14 U/L — ABNORMAL LOW (ref 15–41)
Albumin: 2.9 g/dL — ABNORMAL LOW (ref 3.5–5.0)
Alkaline Phosphatase: 73 U/L (ref 38–126)
Anion gap: 8 (ref 5–15)
BUN: 36 mg/dL — ABNORMAL HIGH (ref 6–20)
CO2: 28 mmol/L (ref 22–32)
Calcium: 9.4 mg/dL (ref 8.9–10.3)
Chloride: 100 mmol/L (ref 98–111)
Creatinine, Ser: 0.91 mg/dL (ref 0.61–1.24)
GFR, Estimated: >60 mL/min (ref >60)
Glucose, Bld: 86 mg/dL (ref 70–99)
Potassium: 4.8 mmol/L (ref 3.5–5.1)
Sodium: 136 mmol/L (ref 135–145)
Total Bilirubin: 0.5 mg/dL (ref 0.3–1.2)
Total Protein: 7.8 g/dL (ref 6.5–8.1)

## 2022-03-21 LAB — CBC WITH DIFFERENTIAL/PLATELET
Abs Immature Granulocytes: 0.05 10*3/uL (ref 0.00–0.07)
Basophils Absolute: 0 10*3/uL (ref 0.0–0.1)
Basophils Relative: 0 %
Eosinophils Absolute: 0.3 10*3/uL (ref 0.0–0.5)
Eosinophils Relative: 3 %
HCT: 30.4 % — ABNORMAL LOW (ref 39.0–52.0)
Hemoglobin: 9.4 g/dL — ABNORMAL LOW (ref 13.0–17.0)
Immature Granulocytes: 1 %
Lymphocytes Relative: 29 %
Lymphs Abs: 2.7 10*3/uL (ref 0.7–4.0)
MCH: 26.3 pg (ref 26.0–34.0)
MCHC: 30.9 g/dL (ref 30.0–36.0)
MCV: 85.2 fL (ref 80.0–100.0)
Monocytes Absolute: 0.8 10*3/uL (ref 0.1–1.0)
Monocytes Relative: 9 %
Neutro Abs: 5.5 10*3/uL (ref 1.7–7.7)
Neutrophils Relative %: 58 %
Platelets: 384 10*3/uL (ref 150–400)
RBC: 3.57 MIL/uL — ABNORMAL LOW (ref 4.22–5.81)
RDW: 16.5 % — ABNORMAL HIGH (ref 11.5–15.5)
WBC: 9.4 10*3/uL (ref 4.0–10.5)
nRBC: 0 % (ref 0.0–0.2)

## 2022-03-21 LAB — MAGNESIUM: Magnesium: 2.4 mg/dL (ref 1.7–2.4)

## 2022-03-21 LAB — CK: Total CK: 13 U/L — ABNORMAL LOW (ref 49–397)

## 2022-03-21 NOTE — Progress Notes (Signed)
PROGRESS NOTE                                                                                                                                                                                                             Patient Demographics:    Reginald Clarke, is a 30 y.o. male, DOB - 02/02/1991, FKC:[id number]  Outpatient Primary MD for the patient is Allwardt, [name], PA-C    LOS - 24  Admit date - 02/25/2022    Chief Complaint  Patient presents with   Nausea   Emesis       Brief Narrative (HPI from H&P)  Mr. Tullis is a 31 y.o. M w/ hx IVDU with MSSA bacteremia, native tricuspid valve endocarditis starting Apr 2023 who is admitted for recurrent endocarditis.  Chronology of events    4/8 Diagnosed with pneumonia, discharged from ED   4/12 Blood cultures from ED visit 4/8 growing MSSA, admitted to hospital 4/21 TEE showed large TV vegetation 4/26 Underwent angiovac by Dr. Cliffton Asters with residual subendocardial component 5/10 Discharged on oritavancin/dalbavancin    5/20-5/22: Had recurrent fevers despite adherence to orita/dalba appointments 5/22: Blood cultures obtained at outpatient ID appointment   5/24-6/8: Cultures from 5/22 growing MSSA again, readmitted; CT chest redemonstrates septic pulmonary emboli; MRI thoracic spine shows possible T11 discitis   6/8-7/5: Discharged from [facility] to [facility] to complete 6 weeks cefazolin   7/5: Completed cefazolin, discharged from [facility] 7/5 on oral cefdinir; despite cefdinir, patient continued to have fevers   7/23: Seen in ER, for fever, weakness, dehdyration blood cultures with bacteremia but patient LWBS 7/24: Returned to ER admitted again   Remains in the hospital, cannot be discharged with indwelling IV line.  Was transferred to my service on 03/17/2022.   Subjective:   Patient in bed, appears comfortable, denies any headache, no fever, no chest pain or pressure,  no shortness of breath , no abdominal pain. No new focal weakness.   Assessment  & Plan :    Sepsis, MSSA & MRSA Bacteremia with Tricuspid endocarditis.  Bcx 7/23: MRSA and MSSA both in 1/1 (patient left ER triage) Bcx 7/24: MRSA and MSSA both in 1/1 (collected on readmission) Bcx 7/26: No growth Bcx 7/28: NGTD TEE 8/3: Severe tricuspid regurgitation with multiple tricuspid vegetations.  Will be seen by CT surgery but not  likely an intervention candidate. 8/8, with pleuritic chest pain , cxr with some pleural effusion , repeat cultures remain negative.   ID added doxycycline for one week, finished the course. He was on cefazolin and daptomycin.  ID changed to daptomycin monotherapy. Plan is for 6 weeks IV Daptomycin from negative 7/26 blood cultures, stop date 04/10/22, Followed by infectious disease, has a R. Arm PICC line.    Prolonged QT interval - QT interval 600 on admission.  Repeat ECGs showed QT down to 480 now. Has been restarted on escitalopram.  Monitor tele, repeat EKG 03/17/22 - stable QTc  Anxiety - Discussed with patient, Xanax not acceptable long-term daily medication.  Will not d/c for now, as patient reports high anxiety during inpatient stay.  Continue PRN Xanax for now, Continue PRN hydroxyzine, Trazodone at night.  Hepatitis C, chronic ([name])  - Consult ID, appreciate recommendations post discharge follow-up with ID.  Normocytic anemia - No clinical bleeding   Polysubstance abuse ([name]), tobacco abuse.  Counseled to quit all. - Continue methadone  Tobacco abuse - Continue nicotine patch, counseled to quit.  Chronic pain syndrome/methadone abuse - Continue methadone has been told to arrange for outpatient methadone supplies postdischarge.        Condition - Fair  Family Communication  : Girlfriend bedside on 03/17/2022  Code Status : Full code  Consults  : ID  PUD Prophylaxis :    Procedures  :            Disposition Plan  :    Status is:  Inpatient  DVT Prophylaxis  :    enoxaparin (LOVENOX) injection 40 mg Start: 02/25/22 1700   Lab Results  Component Value Date   PLT 384 03/21/2022    Diet :  Diet Order             Diet regular Room service appropriate? Yes; Fluid consistency: Thin  Diet effective now                    Inpatient Medications  Scheduled Meds:  Chlorhexidine Gluconate Cloth  6 each Topical Daily   diclofenac Sodium  2 g Topical QID   enoxaparin (LOVENOX) injection  40 mg Subcutaneous Q24H   escitalopram  10 mg Oral Daily   feeding supplement  237 mL Oral BID BM   methadone  135 mg Oral Daily   multivitamin with minerals  1 tablet Oral Daily   nicotine  14 mg Transdermal Daily   sodium chloride flush  3 mL Intravenous Q12H   traZODone  50 mg Oral QHS   Continuous Infusions:  DAPTOmycin (CUBICIN) 750 mg in sodium chloride 0.9 % IVPB 750 mg (03/20/22 2058)   sodium chloride     PRN Meds:.acetaminophen, ALPRAZolam, alum & mag hydroxide-simeth, bisacodyl, docusate sodium, hydrOXYzine, methocarbamol, polyethylene glycol, promethazine, sodium chloride, sodium chloride flush  Time Spent in minutes  30   Susa Raring M.D on 03/21/2022 at 10:45 AM  To page go to [email or web address]   [facility] -  Office  [phone]  See all Orders from today for further details    Objective:   Vitals:   03/20/22 1921 03/21/22 0149 03/21/22 0444 03/21/22 0850  BP: 107/60 104/67 116/69 126/83  Pulse: 93 98 94 98  Resp: 20 16 18 18   Temp:  98 F (36.7 C) 98.4 F (36.9 C) 98.2 F (36.8 C)  TempSrc: Oral Oral Oral Oral  SpO2: 94% 94% 97% 97%  Weight:  Height:        Wt Readings from Last 3 Encounters:  03/20/22 76.4 kg  02/24/22 74.4 kg  02/22/22 74.8 kg    No intake or output data in the 24 hours ending 03/21/22 1045  Physical Exam  Awake Alert, No new F.N deficits, Normal affect [place].AT,PERRAL Supple Neck, No JVD,   Symmetrical Chest wall movement, Good air movement  bilaterally, CTAB RRR,No Gallops, Rubs or new Murmurs,  +ve B.Sounds, Abd Soft, No tenderness,   No Cyanosis, Clubbing or edema     Data Review:    CBC Recent Labs  Lab 03/15/22 0500 03/18/22 0425 03/19/22 0345 03/21/22 0620  WBC 7.8 7.5 7.9 9.4  HGB 7.7* 8.0* 8.5* 9.4*  HCT 24.5* 25.6* 27.0* 30.4*  PLT 283 320 348 384  MCV 85.1 85.0 84.1 85.2  MCH 26.7 26.6 26.5 26.3  MCHC 31.4 31.3 31.5 30.9  RDW 16.4* 16.3* 16.3* 16.5*  LYMPHSABS  --  2.3 2.4 2.7  MONOABS  --  0.6 0.6 0.8  EOSABS  --  0.2 0.2 0.3  BASOSABS  --  0.0 0.0 0.0    Electrolytes Recent Labs  Lab 03/15/22 0500 03/18/22 0425 03/19/22 0345 03/21/22 0620  NA 138 137 137 136  K 3.8 4.4 4.5 4.8  CL 101 102 102 100  CO2 30 28 28 28   GLUCOSE 114* 93 89 86  BUN 10 19 21* 36*  CREATININE 0.83 0.89 0.83 0.91  CALCIUM 8.6* 8.9 9.4 9.4  AST  --  12* 13* 14*  ALT  --  9 9 9   ALKPHOS  --  55 57 73  BILITOT  --  0.4 0.2* 0.5  ALBUMIN  --  2.3* 2.5* 2.9*  MG  --  1.9 2.0 2.4    Micro Results No results found for this or any previous visit (from the past 240 hour(s)).  Radiology Reports No results found.

## 2022-03-22 ENCOUNTER — Encounter: Payer: BLUE CROSS/BLUE SHIELD | Admitting: Thoracic Surgery (Cardiothoracic Vascular Surgery)

## 2022-03-22 DIAGNOSIS — A4101 Sepsis due to Methicillin susceptible Staphylococcus aureus: Secondary | ICD-10-CM | POA: Diagnosis not present

## 2022-03-22 MED ORDER — DICLOFENAC SODIUM 1 % EX GEL: 2.0000 g | Freq: Three times a day (TID) | CUTANEOUS | Status: DC | PRN

## 2022-03-22 NOTE — Progress Notes (Signed)
PROGRESS NOTE                                                                                                                                                                                                             Patient Demographics:    Reginald Clarke, is a 31 y.o. male, DOB - 31-Jul-1991, ZLD:[id number]  Outpatient Primary MD for the patient is Allwardt, [name], PA-C    LOS - 25  Admit date - 02/25/2022    Chief Complaint  Patient presents with   Nausea   Emesis       Brief Narrative (HPI from H&P)  Reginald Clarke is a 31 y.o. M w/ hx IVDU with MSSA bacteremia, native tricuspid valve endocarditis starting Apr 2023 who is admitted for recurrent endocarditis.  Chronology of events    4/8 Diagnosed with pneumonia, discharged from ED   4/12 Blood cultures from ED visit 4/8 growing MSSA, admitted to hospital 4/21 TEE showed large TV vegetation 4/26 Underwent angiovac by Dr. Cliffton Asters with residual subendocardial component 5/10 Discharged on oritavancin/dalbavancin    5/20-5/22: Had recurrent fevers despite adherence to orita/dalba appointments 5/22: Blood cultures obtained at outpatient ID appointment   5/24-6/8: Cultures from 5/22 growing MSSA again, readmitted; CT chest redemonstrates septic pulmonary emboli; MRI thoracic spine shows possible T11 discitis   6/8-7/5: Discharged from [facility] to [facility] to complete 6 weeks cefazolin   7/5: Completed cefazolin, discharged from [facility] 7/5 on oral cefdinir; despite cefdinir, patient continued to have fevers   7/23: Seen in ER, for fever, weakness, dehdyration blood cultures with bacteremia but patient LWBS 7/24: Returned to ER admitted again   Remains in the hospital, cannot be discharged with indwelling IV line.  Was transferred to my service on 03/17/2022.   Subjective:   Patient in bed, appears comfortable, denies any headache, no fever, no chest pain or pressure,  no shortness of breath , no abdominal pain. No new focal weakness.   Assessment  & Plan :    Sepsis, MSSA & MRSA Bacteremia with Tricuspid endocarditis.  Bcx 7/23: MRSA and MSSA both in 1/1 (patient left ER triage) Bcx 7/24: MRSA and MSSA both in 1/1 (collected on readmission) Bcx 7/26: No growth Bcx 7/28: NGTD TEE 8/3: Severe tricuspid regurgitation with multiple tricuspid vegetations.  Will be seen by CT surgery but not  likely an intervention candidate. 8/8, with pleuritic chest pain , cxr with some pleural effusion , repeat cultures remain negative.   ID added doxycycline for one week, finished the course. He was on cefazolin and daptomycin.  ID changed to daptomycin monotherapy. Plan is for 6 weeks IV Daptomycin from negative 7/26 blood cultures, stop date 04/10/22, Followed by infectious disease, has a R. Arm PICC line.    Prolonged QT interval - QT interval 600 on admission.  Repeat ECGs showed QT down to 480 now. Has been restarted on escitalopram.  Monitor tele, repeat EKG 03/17/22 - stable QTc  Anxiety - Discussed with patient, Xanax not acceptable long-term daily medication.  Will not d/c for now, as patient reports high anxiety during inpatient stay.  Continue PRN Xanax for now, Continue PRN hydroxyzine, Trazodone at night.  Hepatitis C, chronic ([name])  - Consult ID, appreciate recommendations post discharge follow-up with ID.  Normocytic anemia - No clinical bleeding   Polysubstance abuse ([name]), tobacco abuse.  Counseled to quit all. - Continue methadone  Tobacco abuse - Continue nicotine patch, counseled to quit.  Chronic pain syndrome/methadone abuse - Continue methadone has been told to arrange for outpatient methadone supplies postdischarge.        Condition - Fair  Family Communication  : Girlfriend bedside on 03/17/2022  Code Status : Full code  Consults  : ID  PUD Prophylaxis :    Procedures  :            Disposition Plan  :    Status is:  Inpatient  DVT Prophylaxis  :    enoxaparin (LOVENOX) injection 40 mg Start: 02/25/22 1700   Lab Results  Component Value Date   PLT 384 03/21/2022    Diet :  Diet Order             Diet regular Room service appropriate? Yes; Fluid consistency: Thin  Diet effective now                    Inpatient Medications  Scheduled Meds:  Chlorhexidine Gluconate Cloth  6 each Topical Daily   diclofenac Sodium  2 g Topical QID   enoxaparin (LOVENOX) injection  40 mg Subcutaneous Q24H   escitalopram  10 mg Oral Daily   feeding supplement  237 mL Oral BID BM   methadone  135 mg Oral Daily   multivitamin with minerals  1 tablet Oral Daily   nicotine  14 mg Transdermal Daily   sodium chloride flush  3 mL Intravenous Q12H   traZODone  50 mg Oral QHS   Continuous Infusions:  DAPTOmycin (CUBICIN) 750 mg in sodium chloride 0.9 % IVPB Stopped (03/22/22 0705)   sodium chloride     PRN Meds:.acetaminophen, ALPRAZolam, alum & mag hydroxide-simeth, bisacodyl, docusate sodium, hydrOXYzine, methocarbamol, polyethylene glycol, promethazine, sodium chloride, sodium chloride flush  Time Spent in minutes  30   Susa Raring M.D on 03/22/2022 at 10:08 AM  To page go to [email or web address]   [facility] -  Office  [phone]  See all Orders from today for further details    Objective:   Vitals:   03/21/22 1633 03/21/22 1926 03/22/22 0200 03/22/22 0800  BP: 90/76 110/73 (!) 100/57 105/68  Pulse:  97 98 88  Resp: 16 18 14 16   Temp: 98.1 F (36.7 C) 98.5 F (36.9 C) 98 F (36.7 C) 98.1 F (36.7 C)  TempSrc: Oral Oral Oral Oral  SpO2:   94% 95%  Weight:  Height:        Wt Readings from Last 3 Encounters:  03/20/22 76.4 kg  02/24/22 74.4 kg  02/22/22 74.8 kg    No intake or output data in the 24 hours ending 03/22/22 1008  Physical Exam  Awake Alert, No new F.N deficits, Normal affect [place].AT,PERRAL Supple Neck, No JVD,   Symmetrical Chest wall movement, Good air  movement bilaterally, CTAB RRR,No Gallops, Rubs or new Murmurs,  +ve B.Sounds, Abd Soft, No tenderness,   No Cyanosis, Clubbing or edema     Data Review:    CBC Recent Labs  Lab 03/18/22 0425 03/19/22 0345 03/21/22 0620  WBC 7.5 7.9 9.4  HGB 8.0* 8.5* 9.4*  HCT 25.6* 27.0* 30.4*  PLT 320 348 384  MCV 85.0 84.1 85.2  MCH 26.6 26.5 26.3  MCHC 31.3 31.5 30.9  RDW 16.3* 16.3* 16.5*  LYMPHSABS 2.3 2.4 2.7  MONOABS 0.6 0.6 0.8  EOSABS 0.2 0.2 0.3  BASOSABS 0.0 0.0 0.0    Electrolytes Recent Labs  Lab 03/18/22 0425 03/19/22 0345 03/21/22 0620  NA 137 137 136  K 4.4 4.5 4.8  CL 102 102 100  CO2 28 28 28   GLUCOSE 93 89 86  BUN 19 21* 36*  CREATININE 0.89 0.83 0.91  CALCIUM 8.9 9.4 9.4  AST 12* 13* 14*  ALT 9 9 9   ALKPHOS 55 57 73  BILITOT 0.4 0.2* 0.5  ALBUMIN 2.3* 2.5* 2.9*  MG 1.9 2.0 2.4    Micro Results No results found for this or any previous visit (from the past 240 hour(s)).  Radiology Reports No results found.

## 2022-03-22 NOTE — Progress Notes (Signed)
This chaplain is present for F/U spiritual care visit with the Pt. and Pt. girlfriend-[name]. Both are sleeping and open to the visit.   The Pt. shares most of his days consist of sleeping and watching TV, anticipating returning home to care for his new litter of hamsters. The chaplain understands another part returning home is filing for disability. The Pt is open to assistance with the process.  The chaplain will continue to periodically check in with the Pt.  Chaplain Stephanie Acre [phone]

## 2022-03-23 DIAGNOSIS — B182 Chronic viral hepatitis C: Secondary | ICD-10-CM | POA: Diagnosis not present

## 2022-03-23 DIAGNOSIS — A4101 Sepsis due to Methicillin susceptible Staphylococcus aureus: Secondary | ICD-10-CM | POA: Diagnosis not present

## 2022-03-23 DIAGNOSIS — G894 Chronic pain syndrome: Secondary | ICD-10-CM | POA: Diagnosis not present

## 2022-03-23 NOTE — Progress Notes (Signed)
PROGRESS NOTE                                                                                                                                                                                                             Patient Demographics:    Reginald Clarke, is a 31 y.o. male, DOB - [birth year], KGM:[id number]  Outpatient Primary MD for the patient is Allwardt, [name], PA-C    LOS - 26  Admit date - 02/25/2022    Chief Complaint  Patient presents with   Nausea   Emesis       Brief Narrative (HPI from H&P)  Reginald Clarke is a 30 y.o. M w/ hx IVDU with MSSA bacteremia, native tricuspid valve endocarditis starting Apr 2023 who is admitted for recurrent endocarditis.  Chronology of events    4/8 Diagnosed with pneumonia, discharged from ED   4/12 Blood cultures from ED visit 4/8 growing MSSA, admitted to hospital 4/21 TEE showed large TV vegetation 4/26 Underwent angiovac by Dr. Cliffton Asters with residual subendocardial component 5/10 Discharged on oritavancin/dalbavancin    5/20-5/22: Had recurrent fevers despite adherence to orita/dalba appointments 5/22: Blood cultures obtained at outpatient ID appointment   5/24-6/8: Cultures from 5/22 growing MSSA again, readmitted; CT chest redemonstrates septic pulmonary emboli; MRI thoracic spine shows possible T11 discitis   6/8-7/5: Discharged from [facility] to [facility] to complete 6 weeks cefazolin   7/5: Completed cefazolin, discharged from [facility] 7/5 on oral cefdinir; despite cefdinir, patient continued to have fevers   7/23: Seen in ER, for fever, weakness, dehdyration blood cultures with bacteremia but patient LWBS 7/24: Returned to ER admitted again   Remains in the hospital, cannot be discharged with indwelling IV line.  Was transferred to my service on 03/17/2022.   Subjective:   Patient in bed, appears comfortable, denies any headache, no fever, no chest pain or pressure,  no shortness of breath , no abdominal pain. No focal weakness.   Assessment  & Plan :    Sepsis, MSSA & MRSA Bacteremia with Tricuspid endocarditis.  Bcx 7/23: MRSA and MSSA both in 1/1 (patient left ER triage) Bcx 7/24: MRSA and MSSA both in 1/1 (collected on readmission) Bcx 7/26: No growth Bcx 7/28: NGTD TEE 8/3: Severe tricuspid regurgitation with multiple tricuspid vegetations.  Will be seen by CT surgery but not likely  an intervention candidate. 8/8, with pleuritic chest pain , cxr with some pleural effusion , repeat cultures remain negative.   ID added doxycycline for one week, finished the course. He was on cefazolin and daptomycin.  ID changed to daptomycin monotherapy. Plan is for 6 weeks IV Daptomycin from negative 7/26 blood cultures, stop date 04/10/22, Followed by infectious disease, has a R. Arm PICC line.    Prolonged QT interval - QT interval 600 on admission.  Repeat ECGs showed QT down to 480 now. Has been restarted on escitalopram.  Monitor tele, repeat EKG 03/17/22 - stable QTc  Anxiety - Discussed with patient, Xanax not acceptable long-term daily medication.  Will not d/c for now, as patient reports high anxiety during inpatient stay.  Continue PRN Xanax for now, Continue PRN hydroxyzine, Trazodone at night.  Hepatitis C, chronic ([name])  - Consult ID, appreciate recommendations post discharge follow-up with ID.  Normocytic anemia - No clinical bleeding   Polysubstance abuse ([name]), tobacco abuse.  Counseled to quit all. - Continue methadone  Tobacco abuse - Continue nicotine patch, counseled to quit.  Chronic pain syndrome/methadone abuse - Continue methadone has been told to arrange for outpatient methadone supplies postdischarge.        Condition - Fair  Family Communication  : Girlfriend bedside on 03/17/2022  Code Status : Full code  Consults  : ID  PUD Prophylaxis :    Procedures  :            Disposition Plan  :    Status is: Inpatient  DVT  Prophylaxis  :    enoxaparin (LOVENOX) injection 40 mg Start: 02/25/22 1700   Lab Results  Component Value Date   PLT 384 03/21/2022    Diet :  Diet Order             Diet regular Room service appropriate? Yes; Fluid consistency: Thin  Diet effective now                    Inpatient Medications  Scheduled Meds:  Chlorhexidine Gluconate Cloth  6 each Topical Daily   enoxaparin (LOVENOX) injection  40 mg Subcutaneous Q24H   escitalopram  10 mg Oral Daily   feeding supplement  237 mL Oral BID BM   methadone  135 mg Oral Daily   multivitamin with minerals  1 tablet Oral Daily   nicotine  14 mg Transdermal Daily   traZODone  50 mg Oral QHS   Continuous Infusions:  DAPTOmycin (CUBICIN) 750 mg in sodium chloride 0.9 % IVPB Stopped (03/22/22 2359)   sodium chloride     PRN Meds:.acetaminophen, ALPRAZolam, alum & mag hydroxide-simeth, bisacodyl, diclofenac Sodium, docusate sodium, hydrOXYzine, methocarbamol, polyethylene glycol, promethazine, sodium chloride, sodium chloride flush  Time Spent in minutes  30   Susa Raring M.D on 03/23/2022 at 9:38 AM  To page go to [email or web address]   [facility] -  Office  [phone]  See all Orders from today for further details    Objective:   Vitals:   03/22/22 2013 03/22/22 2335 03/22/22 2339 03/23/22 0300  BP: (!) 97/56 96/61 111/62 113/65  Pulse: 89 98 96 84  Resp: 16 16  16   Temp: 98.3 F (36.8 C) (!) 97.4 F (36.3 C)  97.9 F (36.6 C)  TempSrc: Oral Oral  Oral  SpO2: 97% 95% 96% 97%  Weight:      Height:        Wt Readings from Last 3 Encounters:  03/20/22 76.4 kg  02/24/22 74.4 kg  02/22/22 74.8 kg    No intake or output data in the 24 hours ending 03/23/22 3825  Physical Exam  Awake Alert, No new F.N deficits, Normal affect [place].AT,PERRAL Supple Neck, No JVD,   Symmetrical Chest wall movement, Good air movement bilaterally, CTAB RRR,No Gallops, Rubs or new Murmurs,  +ve B.Sounds, Abd Soft, No  tenderness,   No Cyanosis, Clubbing or edema     Data Review:    CBC Recent Labs  Lab 03/18/22 0425 03/19/22 0345 03/21/22 0620  WBC 7.5 7.9 9.4  HGB 8.0* 8.5* 9.4*  HCT 25.6* 27.0* 30.4*  PLT 320 348 384  MCV 85.0 84.1 85.2  MCH 26.6 26.5 26.3  MCHC 31.3 31.5 30.9  RDW 16.3* 16.3* 16.5*  LYMPHSABS 2.3 2.4 2.7  MONOABS 0.6 0.6 0.8  EOSABS 0.2 0.2 0.3  BASOSABS 0.0 0.0 0.0    Electrolytes Recent Labs  Lab 03/18/22 0425 03/19/22 0345 03/21/22 0620  NA 137 137 136  K 4.4 4.5 4.8  CL 102 102 100  CO2 28 28 28   GLUCOSE 93 89 86  BUN 19 21* 36*  CREATININE 0.89 0.83 0.91  CALCIUM 8.9 9.4 9.4  AST 12* 13* 14*  ALT 9 9 9   ALKPHOS 55 57 73  BILITOT 0.4 0.2* 0.5  ALBUMIN 2.3* 2.5* 2.9*  MG 1.9 2.0 2.4    Micro Results No results found for this or any previous visit (from the past 240 hour(s)).  Radiology Reports No results found.

## 2022-03-24 DIAGNOSIS — A4101 Sepsis due to Methicillin susceptible Staphylococcus aureus: Secondary | ICD-10-CM | POA: Diagnosis not present

## 2022-03-24 DIAGNOSIS — B182 Chronic viral hepatitis C: Secondary | ICD-10-CM | POA: Diagnosis not present

## 2022-03-24 DIAGNOSIS — G894 Chronic pain syndrome: Secondary | ICD-10-CM | POA: Diagnosis not present

## 2022-03-24 NOTE — Progress Notes (Signed)
PROGRESS NOTE                                                                                                                                                                                                             Patient Demographics:    Reginald Clarke, is a 31 y.o. male, DOB - February 02, 1991, RD:[id number]  Outpatient Primary MD for the patient is Allwardt, Randa Evens, PA-C    LOS - 44  Admit date - 02/25/2022    Chief Complaint  Patient presents with   Nausea   Emesis       Brief Narrative (HPI from H&P)  Reginald Clarke is a 31 y.o. M w/ hx IVDU with MSSA bacteremia, native tricuspid valve endocarditis starting Apr 2023 who is admitted for recurrent endocarditis.  Chronology of events    4/8 Diagnosed with pneumonia, discharged from ED   4/12 Blood cultures from ED visit 4/8 growing MSSA, admitted to hospital 4/21 TEE showed large TV vegetation 4/26 Underwent angiovac by Dr. Kipp Brood with residual subendocardial component 5/10 Discharged on oritavancin/dalbavancin    5/20-5/22: Had recurrent fevers despite adherence to orita/dalba appointments 5/22: Blood cultures obtained at outpatient ID appointment   5/24-6/8: Cultures from 5/22 growing MSSA again, readmitted; CT chest redemonstrates septic pulmonary emboli; MRI thoracic spine shows possible T11 discitis   6/8-7/5: Discharged from [facility] to [place] to complete 6 weeks cefazolin   7/5: Completed cefazolin, discharged from [place] 7/5 on oral cefdinir; despite cefdinir, patient continued to have fevers   7/23: Seen in ER, for fever, weakness, dehdyration blood cultures with bacteremia but patient LWBS 7/24: Returned to ER admitted again   Remains in the hospital, cannot be discharged with indwelling IV line.  Was transferred to my service on 03/17/2022.   Subjective:   Patient in bed, appears comfortable, denies any headache, no fever, no chest pain or pressure,  no shortness of breath , no abdominal pain. No focal weakness.   Assessment  & Plan :    Sepsis, MSSA & MRSA Bacteremia with Tricuspid endocarditis.  Bcx 7/23: MRSA and MSSA both in 1/1 (patient left ER triage) Bcx 7/24: MRSA and MSSA both in 1/1 (collected on readmission) Bcx 7/26: No growth Bcx 7/28: NGTD TEE 8/3: Severe tricuspid regurgitation with multiple tricuspid vegetations.  Will be seen by CT surgery but not likely  an intervention candidate. 8/8, with pleuritic chest pain , cxr with some pleural effusion , repeat cultures remain negative.   ID added doxycycline for one week, finished the course. He was on cefazolin and daptomycin.  ID changed to daptomycin monotherapy. Plan is for 6 weeks IV Daptomycin from negative 7/26 blood cultures, stop date 04/10/22, Followed by infectious disease, has a R. Arm PICC line.    Prolonged QT interval - QT interval 600 on admission.  Repeat ECGs showed QT down to 480 now. Has been restarted on escitalopram.  Monitor tele, repeat EKG 03/17/22 - stable QTc  Anxiety - Discussed with patient, Xanax not acceptable long-term daily medication.  Will not d/c for now, as patient reports high anxiety during inpatient stay.  Continue PRN Xanax for now, Continue PRN hydroxyzine, Trazodone at night.  Hepatitis C, chronic ([name])  - Consult ID, appreciate recommendations post discharge follow-up with ID.  Normocytic anemia - No clinical bleeding   Polysubstance abuse ([name]), tobacco abuse.  Counseled to quit all. - Continue methadone  Tobacco abuse - Continue nicotine patch, counseled to quit.  Chronic pain syndrome/methadone abuse - Continue methadone has been told to arrange for outpatient methadone supplies postdischarge.        Condition - Fair  Family Communication  : Girlfriend bedside on 03/17/2022  Code Status : Full code  Consults  : ID  PUD Prophylaxis :    Procedures  :            Disposition Plan  :    Status is: Inpatient  DVT  Prophylaxis  :    enoxaparin (LOVENOX) injection 40 mg Start: 02/25/22 1700   Lab Results  Component Value Date   PLT 384 03/21/2022    Diet :  Diet Order             Diet regular Room service appropriate? Yes; Fluid consistency: Thin  Diet effective now                    Inpatient Medications  Scheduled Meds:  Chlorhexidine Gluconate Cloth  6 each Topical Daily   enoxaparin (LOVENOX) injection  40 mg Subcutaneous Q24H   escitalopram  10 mg Oral Daily   feeding supplement  237 mL Oral BID BM   methadone  135 mg Oral Daily   multivitamin with minerals  1 tablet Oral Daily   nicotine  14 mg Transdermal Daily   traZODone  50 mg Oral QHS   Continuous Infusions:  DAPTOmycin (CUBICIN) 750 mg in sodium chloride 0.9 % IVPB Stopped (03/23/22 2359)   sodium chloride     PRN Meds:.acetaminophen, ALPRAZolam, alum & mag hydroxide-simeth, bisacodyl, diclofenac Sodium, docusate sodium, hydrOXYzine, methocarbamol, polyethylene glycol, promethazine, sodium chloride, sodium chloride flush  Time Spent in minutes  30   Susa Raring M.D on 03/24/2022 at 9:53 AM  To page go to [email or web address]   [facility] -  Office  [phone]  See all Orders from today for further details    Objective:   Vitals:   03/24/22 0012 03/24/22 0409 03/24/22 0500 03/24/22 0816  BP: 107/61 (!) 93/56  115/76  Pulse: 96 (!) 105  87  Resp: 16 16  18   Temp: 98.2 F (36.8 C) 98.1 F (36.7 C)  97.9 F (36.6 C)  TempSrc: Oral Oral  Oral  SpO2: 93% 94%    Weight:   78.7 kg   Height:        Wt Readings from Last 3 Encounters:  03/24/22 78.7 kg  02/24/22 74.4 kg  02/22/22 74.8 kg     Intake/Output Summary (Last 24 hours) at 03/24/2022 0953 Last data filed at 03/23/2022 2200 Gross per 24 hour  Intake 480 ml  Output --  Net 480 ml    Physical Exam  Awake Alert, No new F.N deficits, Normal affect, right arm PICC line. [place].AT,PERRAL Supple Neck, No JVD,   Symmetrical Chest wall  movement, Good air movement bilaterally, CTAB RRR,No Gallops, Rubs or new Murmurs,  +ve B.Sounds, Abd Soft, No tenderness,   No Cyanosis, Clubbing or edema      Data Review:    CBC Recent Labs  Lab 03/18/22 0425 03/19/22 0345 03/21/22 0620  WBC 7.5 7.9 9.4  HGB 8.0* 8.5* 9.4*  HCT 25.6* 27.0* 30.4*  PLT 320 348 384  MCV 85.0 84.1 85.2  MCH 26.6 26.5 26.3  MCHC 31.3 31.5 30.9  RDW 16.3* 16.3* 16.5*  LYMPHSABS 2.3 2.4 2.7  MONOABS 0.6 0.6 0.8  EOSABS 0.2 0.2 0.3  BASOSABS 0.0 0.0 0.0    Electrolytes Recent Labs  Lab 03/18/22 0425 03/19/22 0345 03/21/22 0620  NA 137 137 136  K 4.4 4.5 4.8  CL 102 102 100  CO2 28 28 28   GLUCOSE 93 89 86  BUN 19 21* 36*  CREATININE 0.89 0.83 0.91  CALCIUM 8.9 9.4 9.4  AST 12* 13* 14*  ALT 9 9 9   ALKPHOS 55 57 73  BILITOT 0.4 0.2* 0.5  ALBUMIN 2.3* 2.5* 2.9*  MG 1.9 2.0 2.4    Micro Results No results found for this or any previous visit (from the past 240 hour(s)).  Radiology Reports No results found.

## 2022-03-25 DIAGNOSIS — G894 Chronic pain syndrome: Secondary | ICD-10-CM | POA: Diagnosis not present

## 2022-03-25 DIAGNOSIS — A4101 Sepsis due to Methicillin susceptible Staphylococcus aureus: Secondary | ICD-10-CM | POA: Diagnosis not present

## 2022-03-25 DIAGNOSIS — B182 Chronic viral hepatitis C: Secondary | ICD-10-CM | POA: Diagnosis not present

## 2022-03-25 LAB — CBC WITH DIFFERENTIAL/PLATELET
Abs Immature Granulocytes: 0.02 10*3/uL (ref 0.00–0.07)
Basophils Absolute: 0 10*3/uL (ref 0.0–0.1)
Basophils Relative: 1 %
Eosinophils Absolute: 0.2 10*3/uL (ref 0.0–0.5)
Eosinophils Relative: 3 %
HCT: 28.9 % — ABNORMAL LOW (ref 39.0–52.0)
Hemoglobin: 8.9 g/dL — ABNORMAL LOW (ref 13.0–17.0)
Immature Granulocytes: 0 %
Lymphocytes Relative: 31 %
Lymphs Abs: 2.4 10*3/uL (ref 0.7–4.0)
MCH: 26.6 pg (ref 26.0–34.0)
MCHC: 30.8 g/dL (ref 30.0–36.0)
MCV: 86.3 fL (ref 80.0–100.0)
Monocytes Absolute: 0.7 10*3/uL (ref 0.1–1.0)
Monocytes Relative: 9 %
Neutro Abs: 4.3 10*3/uL (ref 1.7–7.7)
Neutrophils Relative %: 56 %
Platelets: 272 10*3/uL (ref 150–400)
RBC: 3.35 MIL/uL — ABNORMAL LOW (ref 4.22–5.81)
RDW: 16.8 % — ABNORMAL HIGH (ref 11.5–15.5)
WBC: 7.6 10*3/uL (ref 4.0–10.5)
nRBC: 0 % (ref 0.0–0.2)

## 2022-03-25 LAB — CK: Total CK: 15 U/L — ABNORMAL LOW (ref 49–397)

## 2022-03-25 LAB — COMPREHENSIVE METABOLIC PANEL
ALT: 14 U/L (ref 0–44)
AST: 15 U/L (ref 15–41)
Albumin: 3 g/dL — ABNORMAL LOW (ref 3.5–5.0)
Alkaline Phosphatase: 73 U/L (ref 38–126)
Anion gap: 6 (ref 5–15)
BUN: 33 mg/dL — ABNORMAL HIGH (ref 6–20)
CO2: 29 mmol/L (ref 22–32)
Calcium: 9.4 mg/dL (ref 8.9–10.3)
Chloride: 103 mmol/L (ref 98–111)
Creatinine, Ser: 0.74 mg/dL (ref 0.61–1.24)
GFR, Estimated: >60 mL/min (ref >60)
Glucose, Bld: 95 mg/dL (ref 70–99)
Potassium: 4.4 mmol/L (ref 3.5–5.1)
Sodium: 138 mmol/L (ref 135–145)
Total Bilirubin: 0.3 mg/dL (ref 0.3–1.2)
Total Protein: 7.6 g/dL (ref 6.5–8.1)

## 2022-03-25 LAB — MAGNESIUM: Magnesium: 1.9 mg/dL (ref 1.7–2.4)

## 2022-03-25 NOTE — Plan of Care (Signed)
  Problem: Clinical Measurements: Goal: Ability to maintain clinical measurements within normal limits will improve Outcome: Progressing   Problem: Clinical Measurements: Goal: Will remain free from infection Outcome: Progressing   Problem: Clinical Measurements: Goal: Diagnostic test results will improve Outcome: Progressing   Problem: Clinical Measurements: Goal: Diagnostic test results will improve Outcome: Progressing

## 2022-03-25 NOTE — Progress Notes (Signed)
PROGRESS NOTE                                                                                                                                                                                                             Patient Demographics:    Reginald Clarke, is a 31 y.o. male, DOB - Apr 29, 1991, DJT:[id number]  Outpatient Primary MD for the patient is Allwardt, [name], PA-C    LOS - 28  Admit date - 02/25/2022    Chief Complaint  Patient presents with   Nausea   Emesis       Brief Narrative (HPI from H&P)  Reginald Clarke is a 31 y.o. M w/ hx IVDU with MSSA bacteremia, native tricuspid valve endocarditis starting Apr 2023 who is admitted for recurrent endocarditis.  Chronology of events    4/8 Diagnosed with pneumonia, discharged from ED   4/12 Blood cultures from ED visit 4/8 growing MSSA, admitted to hospital 4/21 TEE showed large TV vegetation 4/26 Underwent angiovac by Dr. Cliffton Asters with residual subendocardial component 5/10 Discharged on oritavancin/dalbavancin    5/20-5/22: Had recurrent fevers despite adherence to orita/dalba appointments 5/22: Blood cultures obtained at outpatient ID appointment   5/24-6/8: Cultures from 5/22 growing MSSA again, readmitted; CT chest redemonstrates septic pulmonary emboli; MRI thoracic spine shows possible T11 discitis   6/8-7/5: Discharged from [facility] to [facility] to complete 6 weeks cefazolin   7/5: Completed cefazolin, discharged from [facility] 7/5 on oral cefdinir; despite cefdinir, patient continued to have fevers   7/23: Seen in ER, for fever, weakness, dehdyration blood cultures with bacteremia but patient LWBS 7/24: Returned to ER admitted again   Remains in the hospital, cannot be discharged with indwelling IV line.  Was transferred to my service on 03/17/2022.   Subjective:   Patient in bed, appears comfortable, denies any headache, no fever, no chest pain or pressure,  no shortness of breath , no abdominal pain. No new focal weakness.   Assessment  & Plan :    Sepsis, MSSA & MRSA Bacteremia with Tricuspid endocarditis.  Bcx 7/23: MRSA and MSSA both in 1/1 (patient left ER triage) Bcx 7/24: MRSA and MSSA both in 1/1 (collected on readmission) Bcx 7/26: No growth Bcx 7/28: NGTD TEE 8/3: Severe tricuspid regurgitation with multiple tricuspid vegetations.  Will be seen by CT surgery but not  likely an intervention candidate. 8/8, with pleuritic chest pain , cxr with some pleural effusion , repeat cultures remain negative.   ID added doxycycline for one week, finished the course. He was on cefazolin and daptomycin.  ID changed to daptomycin monotherapy. Plan is for 6 weeks IV Daptomycin from negative 7/26 blood cultures, stop date 04/10/22, Followed by infectious disease, has a R. Arm PICC line.    Prolonged QT interval - QT interval 600 on admission.  Repeat ECGs showed QT down to 480 now. Has been restarted on escitalopram.  Monitor tele, repeat EKG 03/17/22 - stable QTc  Anxiety - Discussed with patient, Xanax not acceptable long-term daily medication.  Will not d/c for now, as patient reports high anxiety during inpatient stay.  Continue PRN Xanax for now, Continue PRN hydroxyzine, Trazodone at night.  Hepatitis C, chronic ([name])  - Consult ID, appreciate recommendations post discharge follow-up with ID.  Normocytic anemia - No clinical bleeding   Polysubstance abuse ([name]), tobacco abuse.  Counseled to quit all. - Continue methadone  Tobacco abuse - Continue nicotine patch, counseled to quit.  Chronic pain syndrome/methadone abuse - Continue methadone has been told to arrange for outpatient methadone supplies postdischarge.        Condition - Fair  Family Communication  : Girlfriend bedside on 03/17/2022  Code Status : Full code  Consults  : ID  PUD Prophylaxis :    Procedures  :            Disposition Plan  :    Status is:  Inpatient  DVT Prophylaxis  :    enoxaparin (LOVENOX) injection 40 mg Start: 02/25/22 1700   Lab Results  Component Value Date   PLT 272 03/25/2022    Diet :  Diet Order             Diet regular Room service appropriate? Yes; Fluid consistency: Thin  Diet effective now                    Inpatient Medications  Scheduled Meds:  Chlorhexidine Gluconate Cloth  6 each Topical Daily   enoxaparin (LOVENOX) injection  40 mg Subcutaneous Q24H   escitalopram  10 mg Oral Daily   feeding supplement  237 mL Oral BID BM   methadone  135 mg Oral Daily   multivitamin with minerals  1 tablet Oral Daily   nicotine  14 mg Transdermal Daily   traZODone  50 mg Oral QHS   Continuous Infusions:  DAPTOmycin (CUBICIN) 750 mg in sodium chloride 0.9 % IVPB 750 mg (03/24/22 2115)   sodium chloride     PRN Meds:.acetaminophen, ALPRAZolam, alum & mag hydroxide-simeth, bisacodyl, diclofenac Sodium, docusate sodium, hydrOXYzine, methocarbamol, polyethylene glycol, promethazine, sodium chloride, sodium chloride flush  Time Spent in minutes  30   Susa Raring M.D on 03/25/2022 at 11:26 AM  To page go to [email or web address]   [facility] -  Office  [phone]  See all Orders from today for further details    Objective:   Vitals:   03/24/22 2301 03/24/22 2302 03/25/22 0448 03/25/22 0453  BP: 116/70  109/66   Pulse:  89 83   Resp: 16  16   Temp: 98.1 F (36.7 C)  97.9 F (36.6 C)   TempSrc: Oral  Oral   SpO2:  97% 95%   Weight:    77.8 kg  Height:        Wt Readings from Last 3 Encounters:  03/25/22 77.8  kg  02/24/22 74.4 kg  02/22/22 74.8 kg    No intake or output data in the 24 hours ending 03/25/22 1126   Physical Exam  Awake Alert, No new F.N deficits, Normal affect, right arm PICC line. [place].AT,PERRAL Supple Neck, No JVD,   Symmetrical Chest wall movement, Good air movement bilaterally, CTAB RRR,No Gallops, Rubs or new Murmurs,  +ve B.Sounds, Abd Soft, No  tenderness,   No Cyanosis, Clubbing or edema      Data Review:    CBC Recent Labs  Lab 03/19/22 0345 03/21/22 0620 03/25/22 0410  WBC 7.9 9.4 7.6  HGB 8.5* 9.4* 8.9*  HCT 27.0* 30.4* 28.9*  PLT 348 384 272  MCV 84.1 85.2 86.3  MCH 26.5 26.3 26.6  MCHC 31.5 30.9 30.8  RDW 16.3* 16.5* 16.8*  LYMPHSABS 2.4 2.7 2.4  MONOABS 0.6 0.8 0.7  EOSABS 0.2 0.3 0.2  BASOSABS 0.0 0.0 0.0    Electrolytes Recent Labs  Lab 03/19/22 0345 03/21/22 0620 03/25/22 0410  NA 137 136 138  K 4.5 4.8 4.4  CL 102 100 103  CO2 28 28 29   GLUCOSE 89 86 95  BUN 21* 36* 33*  CREATININE 0.83 0.91 0.74  CALCIUM 9.4 9.4 9.4  AST 13* 14* 15  ALT 9 9 14   ALKPHOS 57 73 73  BILITOT 0.2* 0.5 0.3  ALBUMIN 2.5* 2.9* 3.0*  MG 2.0 2.4 1.9    Micro Results No results found for this or any previous visit (from the past 240 hour(s)).  Radiology Reports No results found.

## 2022-03-25 NOTE — Progress Notes (Signed)
Nutrition Brief Note   Pt resting in bed. Reports that he has been eating well, drinking 2-3 Ensures per day. Denies any recent nausea or vomiting. Weight remains stable.  Discussed case with RN. RN reports pt eating well, drinking Ensure great. Taking phenergan every morning still.   Nutrition Diagnosis: Inadequate oral intake related to vomiting, nausea as evidenced by per patient/family report. Goal: Patient will meet greater than or equal to 90% of their needs.   Pt with Ensure and snack ordered, please continue. RD signing off, please re-consult if additional nutrition concerns arise.    Kirby Crigler RD, LDN Clinical Dietitian See Loretha Stapler for contact information.

## 2022-03-26 DIAGNOSIS — A4101 Sepsis due to Methicillin susceptible Staphylococcus aureus: Secondary | ICD-10-CM | POA: Diagnosis not present

## 2022-03-26 NOTE — Progress Notes (Signed)
PROGRESS NOTE                                                                                                                                                                                                             Patient Demographics:    Reginald Clarke, is a 31 y.o. male, DOB - Apr 27, 1991, [id number]  Outpatient Primary MD for the patient is Allwardt, [name], PA-C    LOS - 29  Admit date - 02/25/2022    Chief Complaint  Patient presents with   Nausea   Emesis       Brief Narrative (HPI from H&P)  Mr. Reginald Clarke is a 31 y.o. M w/ hx IVDU with MSSA bacteremia, native tricuspid valve endocarditis starting Apr 2023 who is admitted for recurrent endocarditis.  Chronology of events    4/8 Diagnosed with pneumonia, discharged from ED   4/12 Blood cultures from ED visit 4/8 growing MSSA, admitted to hospital 4/21 TEE showed large TV vegetation 4/26 Underwent angiovac by Dr. Cliffton Asters with residual subendocardial component 5/10 Discharged on oritavancin/dalbavancin    5/20-5/22: Had recurrent fevers despite adherence to orita/dalba appointments 5/22: Blood cultures obtained at outpatient ID appointment   5/24-6/8: Cultures from 5/22 growing MSSA again, readmitted; CT chest redemonstrates septic pulmonary emboli; MRI thoracic spine shows possible T11 discitis   6/8-7/5: Discharged from [facility] to [facility] to complete 6 weeks cefazolin   7/5: Completed cefazolin, discharged from [facility] 7/5 on oral cefdinir; despite cefdinir, patient continued to have fevers   7/23: Seen in ER, for fever, weakness, dehdyration blood cultures with bacteremia but patient LWBS 7/24: Returned to ER admitted again   Remains in the hospital, cannot be discharged with indwelling IV line.  Was transferred to my service on 03/17/2022.   Subjective:   Patient in bed, appears comfortable, denies any headache, no fever, no chest pain or pressure,  no shortness of breath , no abdominal pain. No new focal weakness.   Assessment  & Plan :    Sepsis, MSSA & MRSA Bacteremia with Tricuspid endocarditis.  Bcx 7/23: MRSA and MSSA both in 1/1 (patient left ER triage) Bcx 7/24: MRSA and MSSA both in 1/1 (collected on readmission) Bcx 7/26: No growth Bcx 7/28: NGTD TEE 8/3: Severe tricuspid regurgitation with multiple tricuspid vegetations.  Will be seen by CT surgery but not  likely an intervention candidate. 8/8, with pleuritic chest pain , cxr with some pleural effusion , repeat cultures remain negative.   ID added doxycycline for one week, finished the course. He was on cefazolin and daptomycin.  ID changed to daptomycin monotherapy. Plan is for 6 weeks IV Daptomycin from negative 7/26 blood cultures, stop date 04/10/22, Followed by infectious disease, has a R. Arm PICC line.    Prolonged QT interval - QT interval 600 on admission.  Repeat ECGs showed QT down to 480 now. Has been restarted on escitalopram.  Monitor tele, repeat EKG 03/17/22 - stable QTc  Anxiety - Discussed with patient, Xanax not acceptable long-term daily medication.  Will not d/c for now, as patient reports high anxiety during inpatient stay.  Continue PRN Xanax for now, Continue PRN hydroxyzine, Trazodone at night.  Hepatitis C, chronic ([name])  - Consult ID, appreciate recommendations post discharge follow-up with ID.  Normocytic anemia - No clinical bleeding   Polysubstance abuse ([name]), tobacco abuse.  Counseled to quit all. - Continue methadone  Tobacco abuse - Continue nicotine patch, counseled to quit.  Chronic pain syndrome/methadone abuse - Continue methadone has been told to arrange for outpatient methadone supplies postdischarge.        Condition - Fair  Family Communication  : Girlfriend bedside on 03/17/2022  Code Status : Full code  Consults  : ID  PUD Prophylaxis :    Procedures  :            Disposition Plan  :    Status is:  Inpatient  DVT Prophylaxis  :    enoxaparin (LOVENOX) injection 40 mg Start: 02/25/22 1700   Lab Results  Component Value Date   PLT 272 03/25/2022    Diet :  Diet Order             Diet regular Room service appropriate? Yes; Fluid consistency: Thin  Diet effective now                    Inpatient Medications  Scheduled Meds:  Chlorhexidine Gluconate Cloth  6 each Topical Daily   enoxaparin (LOVENOX) injection  40 mg Subcutaneous Q24H   escitalopram  10 mg Oral Daily   feeding supplement  237 mL Oral BID BM   methadone  135 mg Oral Daily   multivitamin with minerals  1 tablet Oral Daily   nicotine  14 mg Transdermal Daily   traZODone  50 mg Oral QHS   Continuous Infusions:  DAPTOmycin (CUBICIN) 750 mg in sodium chloride 0.9 % IVPB 750 mg (03/25/22 1944)   sodium chloride     PRN Meds:.acetaminophen, ALPRAZolam, alum & mag hydroxide-simeth, bisacodyl, diclofenac Sodium, docusate sodium, hydrOXYzine, methocarbamol, polyethylene glycol, promethazine, sodium chloride, sodium chloride flush  Time Spent in minutes  30   Susa Raring M.D on 03/26/2022 at 10:13 AM  To page go to [email or web address]   [facility] -  Office  [phone]  See all Orders from today for further details    Objective:   Vitals:   03/25/22 1953 03/26/22 0000 03/26/22 0400 03/26/22 0900  BP: 117/73 117/77 117/68 (!) 102/49  Pulse: 93 90 90 92  Resp: 16 16 16    Temp: 98.2 F (36.8 C) 98.2 F (36.8 C) 98.2 F (36.8 C) 98 F (36.7 C)  TempSrc: Oral Oral Oral Oral  SpO2: 93% 95% 95% 95%  Weight:      Height:        Wt Readings from  Last 3 Encounters:  03/25/22 77.8 kg  02/24/22 74.4 kg  02/22/22 74.8 kg    No intake or output data in the 24 hours ending 03/26/22 1013   Physical Exam  Awake Alert, No new F.N deficits, Normal affect, right arm PICC line. [place].AT,PERRAL Supple Neck, No JVD,   Symmetrical Chest wall movement, Good air movement bilaterally, CTAB RRR,No  Gallops, Rubs or new Murmurs,  +ve B.Sounds, Abd Soft, No tenderness,   No Cyanosis, Clubbing or edema     Data Review:    CBC Recent Labs  Lab 03/21/22 0620 03/25/22 0410  WBC 9.4 7.6  HGB 9.4* 8.9*  HCT 30.4* 28.9*  PLT 384 272  MCV 85.2 86.3  MCH 26.3 26.6  MCHC 30.9 30.8  RDW 16.5* 16.8*  LYMPHSABS 2.7 2.4  MONOABS 0.8 0.7  EOSABS 0.3 0.2  BASOSABS 0.0 0.0    Electrolytes Recent Labs  Lab 03/21/22 0620 03/25/22 0410  NA 136 138  K 4.8 4.4  CL 100 103  CO2 28 29  GLUCOSE 86 95  BUN 36* 33*  CREATININE 0.91 0.74  CALCIUM 9.4 9.4  AST 14* 15  ALT 9 14  ALKPHOS 73 73  BILITOT 0.5 0.3  ALBUMIN 2.9* 3.0*  MG 2.4 1.9    Micro Results No results found for this or any previous visit (from the past 240 hour(s)).  Radiology Reports No results found.

## 2022-03-27 DIAGNOSIS — F419 Anxiety disorder, unspecified: Secondary | ICD-10-CM | POA: Diagnosis not present

## 2022-03-27 DIAGNOSIS — A4101 Sepsis due to Methicillin susceptible Staphylococcus aureus: Secondary | ICD-10-CM | POA: Diagnosis not present

## 2022-03-27 DIAGNOSIS — G894 Chronic pain syndrome: Secondary | ICD-10-CM | POA: Diagnosis not present

## 2022-03-27 NOTE — Progress Notes (Signed)
  Progress Note   Patient: Reginald Clarke OVZ:[id number] DOB: 1991/02/02 DOA: 02/25/2022   hospital day   30 DOS: the patient was seen and examined on 03/27/2022     Brief hospital course: Reginald Clarke is a 31 y.o. M w/ hx IVDU with MSSA bacteremia, native tricuspid valve endocarditis starting Apr 2023 who is admitted for recurrent endocarditis. Chronology of events   4/8 Diagnosed with pneumonia, discharged from ED  4/12 Blood cultures from ED visit 4/8 growing MSSA, admitted to hospital 4/21 TEE showed large TV vegetation 4/26 Underwent angiovac by Dr. Cliffton Asters with residual subendocardial component 5/10 Discharged on oritavancin/dalbavancin   5/20-5/22: Had recurrent fevers despite adherence to orita/dalba appointments 5/22: Blood cultures obtained at outpatient ID appointment  5/24-6/8: Cultures from 5/22 growing MSSA again, readmitted; CT chest redemonstrates septic pulmonary emboli; MRI thoracic spine shows possible T11 discitis  6/8-7/5: Discharged from [facility] to [facility] to complete 6 weeks cefazolin  7/5: Completed cefazolin, discharged from [facility] 7/5 on oral cefdinir; despite cefdinir, patient continued to have fevers  7/23: Seen in ER, for fever, weakness, dehdyration blood cultures with bacteremia but patient LWBS 7/24: Returned to ER admitted again  Remains in the hospital, cannot be discharged with indwelling IV line.       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  seen by CT surgery but not likely an intervention candidate. 8/8, with pleuritic chest pain , cxr with some pleural effusion , repeat cultures remain negative.  ID added doxycycline for one week with clinical  improvement.  - was on cefazolin and daptomycin.  ID changed to daptomycin monotherapy. - Plan for 6 weeks IV antibiotics from negative 7/26 blood cultures, until 9/6 - Followed by infectious disease. - has a PICC line.    Anxiety  -Started on Lexapro.  QTc is stable.  Using Xanax as inpatient.  Currently weaned off to 0.25 mg twice daily as needed.   -Patient is aware that we will not prescribe this as long-term.  Prolonged QT interval QT interval 600 on admission.  Repeat ECGs showed QT down to 480 now.   Polysubstance abuse ([name]) Chronic pain syndrome/methadone abuse  - Continue methadone.  Denies current ongoing use of drugs.  He will need methadone clinic follow-up after discharge.        Subjective: Seen and examined.  No new events.   Physical Exam: Vitals:   03/26/22 1141 03/26/22 2000 03/27/22 0500 03/27/22 0735  BP: 104/70 110/62  118/67  Pulse: 74 80  83  Resp: 18 17  20   Temp: 98.6 F (37 C) 98 F (36.7 C)  98.4 F (36.9 C)  TempSrc: Oral Oral  Oral  SpO2: 99% 97%  96%  Weight:   77.8 kg   Height:       Comfortably laying in bed.  Data Reviewed: BMP and CBC.      Disposition: Status is: Inpatient Patient admitted with recurrent endocarditis.  ID recommend inaptient treatment until 9/6  Medically stable but cannot leave hospital.           Author: 11/6, MD 03/27/2022 10:58 AM

## 2022-03-28 DIAGNOSIS — G894 Chronic pain syndrome: Secondary | ICD-10-CM | POA: Diagnosis not present

## 2022-03-28 DIAGNOSIS — A4101 Sepsis due to Methicillin susceptible Staphylococcus aureus: Secondary | ICD-10-CM | POA: Diagnosis not present

## 2022-03-28 DIAGNOSIS — B182 Chronic viral hepatitis C: Secondary | ICD-10-CM | POA: Diagnosis not present

## 2022-03-28 DIAGNOSIS — F1990 Other psychoactive substance use, unspecified, uncomplicated: Secondary | ICD-10-CM | POA: Diagnosis not present

## 2022-03-28 LAB — COMPREHENSIVE METABOLIC PANEL
ALT: 26 U/L (ref 0–44)
AST: 27 U/L (ref 15–41)
Albumin: 3.1 g/dL — ABNORMAL LOW (ref 3.5–5.0)
Alkaline Phosphatase: 80 U/L (ref 38–126)
Anion gap: 5 (ref 5–15)
BUN: 29 mg/dL — ABNORMAL HIGH (ref 6–20)
CO2: 30 mmol/L (ref 22–32)
Calcium: 9.3 mg/dL (ref 8.9–10.3)
Chloride: 101 mmol/L (ref 98–111)
Creatinine, Ser: 0.83 mg/dL (ref 0.61–1.24)
GFR, Estimated: >60 mL/min (ref >60)
Glucose, Bld: 103 mg/dL — ABNORMAL HIGH (ref 70–99)
Potassium: 4.5 mmol/L (ref 3.5–5.1)
Sodium: 136 mmol/L (ref 135–145)
Total Bilirubin: 0.4 mg/dL (ref 0.3–1.2)
Total Protein: 7.6 g/dL (ref 6.5–8.1)

## 2022-03-28 LAB — CBC WITH DIFFERENTIAL/PLATELET
Abs Immature Granulocytes: 0.01 10*3/uL (ref 0.00–0.07)
Basophils Absolute: 0.1 10*3/uL (ref 0.0–0.1)
Basophils Relative: 1 %
Eosinophils Absolute: 0.2 10*3/uL (ref 0.0–0.5)
Eosinophils Relative: 3 %
HCT: 29.8 % — ABNORMAL LOW (ref 39.0–52.0)
Hemoglobin: 9.2 g/dL — ABNORMAL LOW (ref 13.0–17.0)
Immature Granulocytes: 0 %
Lymphocytes Relative: 32 %
Lymphs Abs: 2.2 10*3/uL (ref 0.7–4.0)
MCH: 26.7 pg (ref 26.0–34.0)
MCHC: 30.9 g/dL (ref 30.0–36.0)
MCV: 86.6 fL (ref 80.0–100.0)
Monocytes Absolute: 0.6 10*3/uL (ref 0.1–1.0)
Monocytes Relative: 8 %
Neutro Abs: 3.8 10*3/uL (ref 1.7–7.7)
Neutrophils Relative %: 56 %
Platelets: 263 10*3/uL (ref 150–400)
RBC: 3.44 MIL/uL — ABNORMAL LOW (ref 4.22–5.81)
RDW: 17.2 % — ABNORMAL HIGH (ref 11.5–15.5)
WBC: 6.8 10*3/uL (ref 4.0–10.5)
nRBC: 0 % (ref 0.0–0.2)

## 2022-03-28 LAB — MAGNESIUM: Magnesium: 2 mg/dL (ref 1.7–2.4)

## 2022-03-28 LAB — CK: Total CK: 22 U/L — ABNORMAL LOW (ref 49–397)

## 2022-03-28 NOTE — TOC Progression Note (Signed)
Transition of Care [facility]) - Progression Note    Patient Details  Name: Reginald Clarke MRN: [id number] Date of Birth: [birth year]  Transition of Care [facility]) CM/SW Contact  Harriet Masson, RN Phone Number: 03/28/2022, 9:04 AM  Clinical Narrative:     TOC continues to follow for needs. Patient will need to be inpatient for iv antibiotics until 04/10/22.  Expected Discharge Plan: Home/Self Care Barriers to Discharge: Continued Medical Work up  Expected Discharge Plan and Services Expected Discharge Plan: Home/Self Care   Discharge Planning Services: CM Consult   Living arrangements for the past 2 months: Single Family Home                 DME Arranged: N/A         HH Arranged: NA           Social Determinants of Health (SDOH) Interventions    Readmission Risk Interventions    02/26/2022   10:07 AM  Readmission Risk Prevention Plan  Transportation Screening Complete  PCP or Specialist Appt within 3-5 Days Complete  HRI or Home Care Consult Complete  Palliative Care Screening Not Applicable

## 2022-03-28 NOTE — Progress Notes (Addendum)
PROGRESS NOTE        PATIENT DETAILS Name: Reginald Clarke Age: 31 y.o. Sex: male Date of Birth: January 22, 1991 Admit Date: 02/25/2022 Admitting Physician Dorcas Carrow, MD [id number], [name], PA-C  Brief Summary: Patient is a 31 y.o.  male with history of recurrent MSSA native tricuspid valve endocarditis  Significant events: 4/12-5/10>> hospitalization for MSSA bacteremia with native tricuspid valve endocarditis.  Underwent angio vac procedure on 4/26.Completed cefazolin on 5/10-planned weekly dalbavancin at the ID clinic.   5/22>> outpatient blood culture positive for MSSA 5/24-6/8>> admit to [facility] for persistent bacteremia-worsening back pain-discharge to [facility] on IV Ancef. 7/5>> discharged from [facility] on oral cefdinir.  Continued to have intermittent fever postdischarge.   7/24>> admit for-persistent fever/nausea/vomiting/diarrhea-blood cultures positive for MSSA.  Significant studies: 7/25>> CT chest: Multifocal airspace opacities-increasing cavitary nodules 7/25>> MRI thoracic spine: No evidence of infection in thoracic spine.  Significant microbiology data: 7/24>> urine culture: Negative 7/25>> blood culture: 1/2-MSSA. 7/27>> blood culture: No growth 7/29>> blood culture: No growth  Procedures: 8/3>> TEE: 3 vegetations on tricuspid valve-severe TR.  EF 60-65%  Consults: ID  Subjective: Lying comfortably in bed-denies any chest pain or shortness of breath.  Objective: Vitals: Blood pressure 102/60, pulse 89, temperature 98.5 F (36.9 C), temperature source Oral, resp. rate 16, height 5\' 10"  (1.778 m), weight 78.5 kg, SpO2 100 %.   Exam: Gen Exam:Alert awake-not in any distress HEENT:atraumatic, normocephalic Chest: B/L clear to auscultation anteriorly CVS:S1S2 regular Abdomen:soft non tender, non distended Extremities:no edema Neurology: Non focal Skin: no rash  Pertinent Labs/Radiology:    Latest Ref Rng & Units 03/28/2022     3:41 AM 03/25/2022    4:10 AM 03/21/2022    6:20 AM  CBC  WBC 4.0 - 10.5 K/uL 6.8  7.6  9.4   Hemoglobin 13.0 - 17.0 g/dL 9.2  8.9  9.4   Hematocrit 39.0 - 52.0 % 29.8  28.9  30.4   Platelets 150 - 400 K/uL 263  272  384     Lab Results  Component Value Date   NA 136 03/28/2022   K 4.5 03/28/2022   CL 101 03/28/2022   CO2 30 03/28/2022      Assessment/Plan: Sepsis due to recurrent MSSA bacteremia with native tricuspid valve endocarditis: Afebrile-nontoxic-appearing-plan is to continue IV antibiotics until 9/6.  Will need to remain inpatient until patient completes a course of IV antibiotics.  Will need to reconsult infectious disease for further recommendations prior to discharge.  Normocytic anemia: Due to chronic disease/recurrent endocarditis: No indication for transfusion.  Anxiety: Stable-on Lexapro-Xanax be slowly weaned down.  Methamphetamine use: In remission  Chronic methadone use  Chronic HCV  Nutrition Status: Nutrition Problem: Inadequate oral intake Etiology: vomiting, nausea Signs/Symptoms: per patient/family report Interventions: Ensure Enlive (each supplement provides 350kcal and 20 grams of protein), MVI, Snacks  BMI: Estimated body mass index is 24.83 kg/m as calculated from the following:   Height as of this encounter: 5\' 10"  (1.778 m).   Weight as of this encounter: 78.5 kg.   Code status:   Code Status: Full Code   DVT Prophylaxis: enoxaparin (LOVENOX) injection 40 mg Start: 02/25/22 1700   Family Communication: None at bedside   Disposition Plan: Status is: Inpatient Remains inpatient appropriate because: Remain inpatient until 9/6 till patient completes course of IV antibiotics.   Planned Discharge Destination:Home  Diet: Diet Order             Diet regular Room service appropriate? Yes; Fluid consistency: Thin  Diet effective now                     Antimicrobial agents: Anti-infectives (From admission, onward)     Start     Dose/Rate Route Frequency Ordered Stop   03/12/22 1500  doxycycline (VIBRA-TABS) tablet 100 mg        100 mg Oral 2 times daily 03/12/22 1412 03/19/22 0852   03/09/22 2000  DAPTOmycin (CUBICIN) 750 mg in sodium chloride 0.9 % IVPB        10 mg/kg  74 kg 130 mL/hr over 30 Minutes Intravenous Daily 03/09/22 1036 04/11/22 1959   02/28/22 1245  DAPTOmycin (CUBICIN) 600 mg in sodium chloride 0.9 % IVPB  Status:  Discontinued        8 mg/kg  74 kg 124 mL/hr over 30 Minutes Intravenous Daily 02/28/22 1145 03/09/22 1036   02/25/22 1745  ceFAZolin (ANCEF) IVPB 2g/100 mL premix  Status:  Discontinued        2 g 200 mL/hr over 30 Minutes Intravenous Every 8 hours 02/25/22 1734 03/09/22 1036        MEDICATIONS: Scheduled Meds:  Chlorhexidine Gluconate Cloth  6 each Topical Daily   enoxaparin (LOVENOX) injection  40 mg Subcutaneous Q24H   escitalopram  10 mg Oral Daily   feeding supplement  237 mL Oral BID BM   methadone  135 mg Oral Daily   multivitamin with minerals  1 tablet Oral Daily   nicotine  14 mg Transdermal Daily   traZODone  50 mg Oral QHS   Continuous Infusions:  DAPTOmycin (CUBICIN) 750 mg in sodium chloride 0.9 % IVPB 750 mg (03/27/22 1950)   sodium chloride     PRN Meds:.acetaminophen, ALPRAZolam, alum & mag hydroxide-simeth, bisacodyl, diclofenac Sodium, docusate sodium, hydrOXYzine, methocarbamol, polyethylene glycol, promethazine, sodium chloride, sodium chloride flush   I have personally reviewed following labs and imaging studies  LABORATORY DATA: CBC: Recent Labs  Lab 03/25/22 0410 03/28/22 0341  WBC 7.6 6.8  NEUTROABS 4.3 3.8  HGB 8.9* 9.2*  HCT 28.9* 29.8*  MCV 86.3 86.6  PLT 272 263    Basic Metabolic Panel: Recent Labs  Lab 03/25/22 0410 03/28/22 0341  NA 138 136  K 4.4 4.5  CL 103 101  CO2 29 30  GLUCOSE 95 103*  BUN 33* 29*  CREATININE 0.74 0.83  CALCIUM 9.4 9.3  MG 1.9 2.0    GFR: Estimated Creatinine Clearance: 133.1  mL/min (by C-G formula based on SCr of 0.83 mg/dL).  Liver Function Tests: Recent Labs  Lab 03/25/22 0410 03/28/22 0341  AST 15 27  ALT 14 26  ALKPHOS 73 80  BILITOT 0.3 0.4  PROT 7.6 7.6  ALBUMIN 3.0* 3.1*   No results for input(s): "LIPASE", "AMYLASE" in the last 168 hours. No results for input(s): "AMMONIA" in the last 168 hours.  Coagulation Profile: No results for input(s): "INR", "PROTIME" in the last 168 hours.  Cardiac Enzymes: Recent Labs  Lab 03/25/22 0410 03/28/22 0341  CKTOTAL 15* 22*    BNP (last 3 results) No results for input(s): "PROBNP" in the last 8760 hours.  Lipid Profile: No results for input(s): "CHOL", "HDL", "LDLCALC", "TRIG", "CHOLHDL", "LDLDIRECT" in the last 72 hours.  Thyroid Function Tests: No results for input(s): "TSH", "T4TOTAL", "FREET4", "T3FREE", "THYROIDAB" in the last 72 hours.  Anemia Panel: No results for input(s): "VITAMINB12", "FOLATE", "FERRITIN", "TIBC", "IRON", "RETICCTPCT" in the last 72 hours.  Urine analysis:    Component Value Date/Time   COLORURINE AMBER (A) 02/25/2022 1250   APPEARANCEUR HAZY (A) 02/25/2022 1250   LABSPEC 1.015 02/25/2022 1250   PHURINE 6.0 02/25/2022 1250   GLUCOSEU NEGATIVE 02/25/2022 1250   HGBUR LARGE (A) 02/25/2022 1250   BILIRUBINUR NEGATIVE 02/25/2022 1250   KETONESUR NEGATIVE 02/25/2022 1250   PROTEINUR 30 (A) 02/25/2022 1250   NITRITE NEGATIVE 02/25/2022 1250   LEUKOCYTESUR TRACE (A) 02/25/2022 1250    Sepsis Labs: Lactic Acid, Venous    Component Value Date/Time   LATICACIDVEN 1.5 02/28/2022 0657    MICROBIOLOGY: No results found for this or any previous visit (from the past 240 hour(s)).  RADIOLOGY STUDIES/RESULTS: No results found.   LOS: 31 days   Jeoffrey Massed, MD  [facility]    To contact the attending provider between 7A-7P or the covering provider during after hours 7P-7A, please log into the web site [email or web address] and access using universal [facility] password for that web site. If you do not have the password, please call the hospital operator.  03/28/2022, 10:21 AM

## 2022-03-29 DIAGNOSIS — A4101 Sepsis due to Methicillin susceptible Staphylococcus aureus: Secondary | ICD-10-CM | POA: Diagnosis not present

## 2022-03-29 DIAGNOSIS — F1990 Other psychoactive substance use, unspecified, uncomplicated: Secondary | ICD-10-CM | POA: Diagnosis not present

## 2022-03-29 DIAGNOSIS — G894 Chronic pain syndrome: Secondary | ICD-10-CM | POA: Diagnosis not present

## 2022-03-29 DIAGNOSIS — B182 Chronic viral hepatitis C: Secondary | ICD-10-CM | POA: Diagnosis not present

## 2022-03-29 NOTE — Progress Notes (Signed)
PROGRESS NOTE        PATIENT DETAILS Name: Reginald Clarke Age: 31 y.o. Sex: male Date of Birth: 09/24/1990 Admit Date: 02/25/2022 Admitting Physician Dorcas Carrow, MD IRS:[id number], [name], PA-C  Brief Summary: Patient is a 31 y.o.  male with history of recurrent MSSA native tricuspid valve endocarditis  Significant events: 4/12-5/10>> hospitalization for MSSA bacteremia with native tricuspid valve endocarditis.  Underwent angio vac procedure on 4/26.Completed cefazolin on 5/10-planned weekly dalbavancin at the ID clinic.   5/22>> outpatient blood culture positive for MSSA 5/24-6/8>> admit to [facility] for persistent bacteremia-worsening back pain-discharge to [facility] on IV Ancef. 7/5>> discharged from [facility] on oral cefdinir.  Continued to have intermittent fever postdischarge.   7/24>> admit for-persistent fever/nausea/vomiting/diarrhea-blood cultures positive for MSSA.  Significant studies: 7/25>> CT chest: Multifocal airspace opacities-increasing cavitary nodules 7/25>> MRI thoracic spine: No evidence of infection in thoracic spine.  Significant microbiology data: 7/24>> urine culture: Negative 7/25>> blood culture: 1/2-MSSA. 7/27>> blood culture: No growth 7/29>> blood culture: No growth  Procedures: 8/3>> TEE: 3 vegetations on tricuspid valve-severe TR.  EF 60-65%  Consults: ID  Subjective: No issues overnight.  Lying comfortably in bed.  Objective: Vitals: Blood pressure (!) 93/59, pulse 88, temperature 98 F (36.7 C), temperature source Oral, resp. rate 18, height 5\' 10"  (1.778 m), weight 78.3 kg, SpO2 99 %.   Exam: Gen Exam:Alert awake-not in any distress HEENT:atraumatic, normocephalic Chest: B/L clear to auscultation anteriorly CVS:S1S2 regular Abdomen:soft non tender, non distended Extremities:no edema Neurology: Non focal Skin: no rash  Pertinent Labs/Radiology:    Latest Ref Rng & Units 03/28/2022    3:41 AM 03/25/2022    4:10  AM 03/21/2022    6:20 AM  CBC  WBC 4.0 - 10.5 K/uL 6.8  7.6  9.4   Hemoglobin 13.0 - 17.0 g/dL 9.2  8.9  9.4   Hematocrit 39.0 - 52.0 % 29.8  28.9  30.4   Platelets 150 - 400 K/uL 263  272  384     Lab Results  Component Value Date   NA 136 03/28/2022   K 4.5 03/28/2022   CL 101 03/28/2022   CO2 30 03/28/2022      Assessment/Plan: Sepsis due to recurrent MSSA bacteremia with native tricuspid valve endocarditis: Afebrile-nontoxic-appearing-plan is to continue IV antibiotics until 9/6.  Will need to remain inpatient until patient completes a course of IV antibiotics.  Will need to reconsult infectious disease for further recommendations prior to discharge.  Normocytic anemia: Due to chronic disease/recurrent endocarditis: No indication for transfusion.  Anxiety: Stable-on Lexapro-Xanax be slowly weaned down.  Methamphetamine use: In remission  Chronic methadone use  Chronic HCV  Nutrition Status: Nutrition Problem: Inadequate oral intake Etiology: vomiting, nausea Signs/Symptoms: per patient/family report Interventions: Ensure Enlive (each supplement provides 350kcal and 20 grams of protein), MVI, Snacks  BMI: Estimated body mass index is 24.77 kg/m as calculated from the following:   Height as of this encounter: 5\' 10"  (1.778 m).   Weight as of this encounter: 78.3 kg.   Code status:   Code Status: Full Code   DVT Prophylaxis: enoxaparin (LOVENOX) injection 40 mg Start: 02/25/22 1700   Family Communication: None at bedside   Disposition Plan: Status is: Inpatient Remains inpatient appropriate because: Remain inpatient until 9/6 till patient completes course of IV antibiotics.   Planned Discharge Destination:Home after patient  completes a course of IV antibiotics-stop date 9/6.   Diet: Diet Order             Diet regular Room service appropriate? Yes; Fluid consistency: Thin  Diet effective now                     Antimicrobial  agents: Anti-infectives (From admission, onward)    Start     Dose/Rate Route Frequency Ordered Stop   03/12/22 1500  doxycycline (VIBRA-TABS) tablet 100 mg        100 mg Oral 2 times daily 03/12/22 1412 03/19/22 0852   03/09/22 2000  DAPTOmycin (CUBICIN) 750 mg in sodium chloride 0.9 % IVPB        10 mg/kg  74 kg 130 mL/hr over 30 Minutes Intravenous Daily 03/09/22 1036 04/11/22 1959   02/28/22 1245  DAPTOmycin (CUBICIN) 600 mg in sodium chloride 0.9 % IVPB  Status:  Discontinued        8 mg/kg  74 kg 124 mL/hr over 30 Minutes Intravenous Daily 02/28/22 1145 03/09/22 1036   02/25/22 1745  ceFAZolin (ANCEF) IVPB 2g/100 mL premix  Status:  Discontinued        2 g 200 mL/hr over 30 Minutes Intravenous Every 8 hours 02/25/22 1734 03/09/22 1036        MEDICATIONS: Scheduled Meds:  Chlorhexidine Gluconate Cloth  6 each Topical Daily   enoxaparin (LOVENOX) injection  40 mg Subcutaneous Q24H   escitalopram  10 mg Oral Daily   feeding supplement  237 mL Oral BID BM   methadone  135 mg Oral Daily   multivitamin with minerals  1 tablet Oral Daily   nicotine  14 mg Transdermal Daily   traZODone  50 mg Oral QHS   Continuous Infusions:  DAPTOmycin (CUBICIN) 750 mg in sodium chloride 0.9 % IVPB 750 mg (03/28/22 2046)   sodium chloride     PRN Meds:.acetaminophen, ALPRAZolam, alum & mag hydroxide-simeth, bisacodyl, diclofenac Sodium, docusate sodium, hydrOXYzine, methocarbamol, polyethylene glycol, promethazine, sodium chloride, sodium chloride flush   I have personally reviewed following labs and imaging studies  LABORATORY DATA: CBC: Recent Labs  Lab 03/25/22 0410 03/28/22 0341  WBC 7.6 6.8  NEUTROABS 4.3 3.8  HGB 8.9* 9.2*  HCT 28.9* 29.8*  MCV 86.3 86.6  PLT 272 263     Basic Metabolic Panel: Recent Labs  Lab 03/25/22 0410 03/28/22 0341  NA 138 136  K 4.4 4.5  CL 103 101  CO2 29 30  GLUCOSE 95 103*  BUN 33* 29*  CREATININE 0.74 0.83  CALCIUM 9.4 9.3  MG 1.9  2.0     GFR: Estimated Creatinine Clearance: 133.1 mL/min (by C-G formula based on SCr of 0.83 mg/dL).  Liver Function Tests: Recent Labs  Lab 03/25/22 0410 03/28/22 0341  AST 15 27  ALT 14 26  ALKPHOS 73 80  BILITOT 0.3 0.4  PROT 7.6 7.6  ALBUMIN 3.0* 3.1*    No results for input(s): "LIPASE", "AMYLASE" in the last 168 hours. No results for input(s): "AMMONIA" in the last 168 hours.  Coagulation Profile: No results for input(s): "INR", "PROTIME" in the last 168 hours.  Cardiac Enzymes: Recent Labs  Lab 03/25/22 0410 03/28/22 0341  CKTOTAL 15* 22*     BNP (last 3 results) No results for input(s): "PROBNP" in the last 8760 hours.  Lipid Profile: No results for input(s): "CHOL", "HDL", "LDLCALC", "TRIG", "CHOLHDL", "LDLDIRECT" in the last 72 hours.  Thyroid Function Tests: No  results for input(s): "TSH", "T4TOTAL", "FREET4", "T3FREE", "THYROIDAB" in the last 72 hours.  Anemia Panel: No results for input(s): "VITAMINB12", "FOLATE", "FERRITIN", "TIBC", "IRON", "RETICCTPCT" in the last 72 hours.  Urine analysis:    Component Value Date/Time   COLORURINE AMBER (A) 02/25/2022 1250   APPEARANCEUR HAZY (A) 02/25/2022 1250   LABSPEC 1.015 02/25/2022 1250   PHURINE 6.0 02/25/2022 1250   GLUCOSEU NEGATIVE 02/25/2022 1250   HGBUR LARGE (A) 02/25/2022 1250   BILIRUBINUR NEGATIVE 02/25/2022 1250   KETONESUR NEGATIVE 02/25/2022 1250   PROTEINUR 30 (A) 02/25/2022 1250   NITRITE NEGATIVE 02/25/2022 1250   LEUKOCYTESUR TRACE (A) 02/25/2022 1250    Sepsis Labs: Lactic Acid, Venous    Component Value Date/Time   LATICACIDVEN 1.5 02/28/2022 0657    MICROBIOLOGY: No results found for this or any previous visit (from the past 240 hour(s)).  RADIOLOGY STUDIES/RESULTS: No results found.   LOS: 32 days   Jeoffrey Massed, MD  [facility]    To contact the attending provider between 7A-7P or the covering provider during after hours 7P-7A, please log into  the web site [email or web address] and access using universal [place] password for that web site. If you do not have the password, please call the hospital operator.  03/29/2022, 10:46 AM

## 2022-03-30 DIAGNOSIS — A4101 Sepsis due to Methicillin susceptible Staphylococcus aureus: Secondary | ICD-10-CM | POA: Diagnosis not present

## 2022-03-30 MED ORDER — LOPERAMIDE HCL 2 MG PO CAPS
2.0000 mg | ORAL_CAPSULE | ORAL | Status: DC | PRN
  Administered 2022-03-30: 2 mg via ORAL
  Filled 2022-03-30: qty 1

## 2022-03-30 NOTE — Progress Notes (Signed)
PROGRESS NOTE        PATIENT DETAILS Name: Reginald Clarke Age: 31 y.o. Sex: male Date of Birth: October 24, 1990 Admit Date: 02/25/2022 Admitting Physician Dorcas Carrow, MD YIR:[id number], [name], PA-C  Brief Summary: Patient is a 31 y.o.  male with history of recurrent MSSA native tricuspid valve endocarditis  Significant events: 4/12-5/10>> hospitalization for MSSA bacteremia with native tricuspid valve endocarditis.  Underwent angio vac procedure on 4/26.Completed cefazolin on 5/10-planned weekly dalbavancin at the ID clinic.   5/22>> outpatient blood culture positive for MSSA 5/24-6/8>> admit to [facility] for persistent bacteremia-worsening back pain-discharge to [facility] on IV Ancef. 7/5>> discharged from [facility] on oral cefdinir.  Continued to have intermittent fever postdischarge.   7/24>> admit for-persistent fever/nausea/vomiting/diarrhea-blood cultures positive for MSSA.  Significant studies: 7/25>> CT chest: Multifocal airspace opacities-increasing cavitary nodules 7/25>> MRI thoracic spine: No evidence of infection in thoracic spine.  Significant microbiology data: 7/24>> urine culture: Negative 7/25>> blood culture: 1/2-MSSA. 7/27>> blood culture: No growth 7/29>> blood culture: No growth  Procedures: 8/3>> TEE: 3 vegetations on tricuspid valve-severe TR.  EF 60-65%  Consults: ID  Subjective: No chest pain/shortness of breath.  Lying comfortably in bed.  Objective: Vitals: Blood pressure 119/88, pulse 94, temperature 97.7 F (36.5 C), temperature source Oral, resp. rate 16, height 5\' 10"  (1.778 m), weight 77.3 kg, SpO2 98 %.   Exam: Gen Exam:Alert awake-not in any distress HEENT:atraumatic, normocephalic Chest: B/L clear to auscultation anteriorly CVS:S1S2 regular Abdomen:soft non tender, non distended Extremities:no edema Neurology: Non focal Skin: no rash  Pertinent Labs/Radiology:    Latest Ref Rng & Units 03/28/2022    3:41 AM  03/25/2022    4:10 AM 03/21/2022    6:20 AM  CBC  WBC 4.0 - 10.5 K/uL 6.8  7.6  9.4   Hemoglobin 13.0 - 17.0 g/dL 9.2  8.9  9.4   Hematocrit 39.0 - 52.0 % 29.8  28.9  30.4   Platelets 150 - 400 K/uL 263  272  384     Lab Results  Component Value Date   NA 136 03/28/2022   K 4.5 03/28/2022   CL 101 03/28/2022   CO2 30 03/28/2022      Assessment/Plan: Sepsis due to recurrent MSSA bacteremia with native tricuspid valve endocarditis: Afebrile-nontoxic-appearing-plan is to continue IV antibiotics until 9/6.  Will need to remain inpatient until patient completes a course of IV antibiotics.  Will need to reconsult infectious disease for further recommendations prior to discharge.  Normocytic anemia: Due to chronic disease/recurrent endocarditis: No indication for transfusion.  Anxiety: Stable-on Lexapro-Xanax be slowly weaned down.  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   Weight as of this encounter: 77.3 kg.   Code status:   Code Status: Full Code   DVT Prophylaxis: enoxaparin (LOVENOX) injection 40 mg Start: 02/25/22 1700   Family Communication: None at bedside   Disposition Plan: Status is: Inpatient Remains inpatient appropriate because: Remain inpatient until 9/6 till patient completes course of IV antibiotics.   Planned Discharge Destination:Home after  patient completes a course of IV antibiotics-stop date 9/6.   Diet: Diet Order             Diet regular Room service appropriate? Yes; Fluid consistency: Thin  Diet effective now                     Antimicrobial  agents: Anti-infectives (From admission, onward)    Start     Dose/Rate Route Frequency Ordered Stop   03/12/22 1500  doxycycline (VIBRA-TABS) tablet 100 mg        100 mg Oral 2 times daily 03/12/22 1412 03/19/22 0852   03/09/22 2000  DAPTOmycin (CUBICIN) 750 mg in sodium chloride 0.9 % IVPB        10 mg/kg  74 kg 130 mL/hr over 30 Minutes Intravenous Daily 03/09/22 1036 04/11/22 1959   02/28/22 1245  DAPTOmycin (CUBICIN) 600 mg in sodium chloride 0.9 % IVPB  Status:  Discontinued        8 mg/kg  74 kg 124 mL/hr over 30 Minutes Intravenous Daily 02/28/22 1145 03/09/22 1036   02/25/22 1745  ceFAZolin (ANCEF) IVPB 2g/100 mL premix  Status:  Discontinued        2 g 200 mL/hr over 30 Minutes Intravenous Every 8 hours 02/25/22 1734 03/09/22 1036        MEDICATIONS: Scheduled Meds:  Chlorhexidine Gluconate Cloth  6 each Topical Daily   enoxaparin (LOVENOX) injection  40 mg Subcutaneous Q24H   escitalopram  10 mg Oral Daily   feeding supplement  237 mL Oral BID BM   methadone  135 mg Oral Daily   multivitamin with minerals  1 tablet Oral Daily   nicotine  14 mg Transdermal Daily   traZODone  50 mg Oral QHS   Continuous Infusions:  DAPTOmycin (CUBICIN) 750 mg in sodium chloride 0.9 % IVPB 750 mg (03/29/22 2019)   sodium chloride     PRN Meds:.acetaminophen, ALPRAZolam, alum & mag hydroxide-simeth, bisacodyl, diclofenac Sodium, docusate sodium, hydrOXYzine, methocarbamol, polyethylene glycol, promethazine, sodium chloride, sodium chloride flush   I have personally reviewed following labs and imaging studies  LABORATORY DATA: CBC: Recent Labs  Lab 03/25/22 0410 03/28/22 0341  WBC 7.6 6.8  NEUTROABS 4.3 3.8  HGB 8.9* 9.2*  HCT 28.9* 29.8*  MCV 86.3 86.6  PLT 272 263     Basic Metabolic Panel: Recent Labs  Lab 03/25/22 0410 03/28/22 0341  NA 138 136  K 4.4 4.5  CL 103 101  CO2 29 30  GLUCOSE 95 103*  BUN 33* 29*  CREATININE 0.74 0.83  CALCIUM 9.4 9.3  MG 1.9  2.0     GFR: Estimated Creatinine Clearance: 133.1 mL/min (by C-G formula based on SCr of 0.83 mg/dL).  Liver Function Tests: Recent Labs  Lab 03/25/22 0410 03/28/22 0341  AST 15 27  ALT 14 26  ALKPHOS 73 80  BILITOT 0.3 0.4  PROT 7.6 7.6  ALBUMIN 3.0* 3.1*    No results for input(s): "LIPASE", "AMYLASE" in the last 168 hours. No results for input(s): "AMMONIA" in the last 168 hours.  Coagulation Profile: No results for input(s): "INR", "PROTIME" in the last 168 hours.  Cardiac Enzymes: Recent Labs  Lab 03/25/22 0410 03/28/22 0341  CKTOTAL 15* 22*     BNP (last 3 results) No results for input(s): "PROBNP" in the last 8760 hours.  Lipid Profile: No results for input(s): "CHOL", "HDL", "LDLCALC", "TRIG", "CHOLHDL", "LDLDIRECT" in the last 72 hours.  Thyroid Function Tests:  No results for input(s): "TSH", "T4TOTAL", "FREET4", "T3FREE", "THYROIDAB" in the last 72 hours.  Anemia Panel: No results for input(s): "VITAMINB12", "FOLATE", "FERRITIN", "TIBC", "IRON", "RETICCTPCT" in the last 72 hours.  Urine analysis:    Component Value Date/Time   COLORURINE AMBER (A) 02/25/2022 1250   APPEARANCEUR HAZY (A) 02/25/2022 1250   LABSPEC 1.015 02/25/2022 1250   PHURINE 6.0 02/25/2022 1250   GLUCOSEU NEGATIVE 02/25/2022 1250   HGBUR LARGE (A) 02/25/2022 1250   BILIRUBINUR NEGATIVE 02/25/2022 1250   KETONESUR NEGATIVE 02/25/2022 1250   PROTEINUR 30 (A) 02/25/2022 1250   NITRITE NEGATIVE 02/25/2022 1250   LEUKOCYTESUR TRACE (A) 02/25/2022 1250    Sepsis Labs: Lactic Acid, Venous    Component Value Date/Time   LATICACIDVEN 1.5 02/28/2022 0657    MICROBIOLOGY: No results found for this or any previous visit (from the past 240 hour(s)).  RADIOLOGY STUDIES/RESULTS: No results found.   LOS: 33 days   Jeoffrey Massed, MD  [facility]    To contact the attending provider between 7A-7P or the covering provider during after hours 7P-7A, please log into  the web site [email or web address] and access using universal [place] password for that web site. If you do not have the password, please call the hospital operator.  03/30/2022, 11:02 AM

## 2022-03-31 DIAGNOSIS — G894 Chronic pain syndrome: Secondary | ICD-10-CM | POA: Diagnosis not present

## 2022-03-31 DIAGNOSIS — B182 Chronic viral hepatitis C: Secondary | ICD-10-CM | POA: Diagnosis not present

## 2022-03-31 DIAGNOSIS — A4101 Sepsis due to Methicillin susceptible Staphylococcus aureus: Secondary | ICD-10-CM | POA: Diagnosis not present

## 2022-03-31 NOTE — Progress Notes (Signed)
PROGRESS NOTE        PATIENT DETAILS Name: Reginald Clarke Age: 31 y.o. Sex: male Date of Birth: Jan 21, 1991 Admit Date: 02/25/2022 Admitting Physician Dorcas Carrow, MD VWU:[id number], [name], PA-C  Brief Summary: Patient is a 31 y.o.  male with history of recurrent MSSA native tricuspid valve endocarditis  Significant events: 4/12-5/10>> hospitalization for MSSA bacteremia with native tricuspid valve endocarditis.  Underwent angio vac procedure on 4/26.Completed cefazolin on 5/10-planned weekly dalbavancin at the ID clinic.   5/22>> outpatient blood culture positive for MSSA 5/24-6/8>> admit to [facility] for persistent bacteremia-worsening back pain-discharge to [facility] on IV Ancef. 7/5>> discharged from [facility] on oral cefdinir.  Continued to have intermittent fever postdischarge.   7/24>> admit for-persistent fever/nausea/vomiting/diarrhea-blood cultures positive for MSSA.  Significant studies: 7/25>> CT chest: Multifocal airspace opacities-increasing cavitary nodules 7/25>> MRI thoracic spine: No evidence of infection in thoracic spine.  Significant microbiology data: 7/24>> urine culture: Negative 7/25>> blood culture: 1/2-MSSA. 7/27>> blood culture: No growth 7/29>> blood culture: No growth  Procedures: 8/3>> TEE: 3 vegetations on tricuspid valve-severe TR.  EF 60-65%  Consults: ID  Subjective:  Patient in bed, appears comfortable, denies any headache, no fever, no chest pain or pressure, no shortness of breath , no abdominal pain. No new focal weakness.   Objective: Vitals: Blood pressure (!) 93/55, pulse 86, temperature 97.8 F (36.6 C), temperature source Oral, resp. rate 16, height 5\' 10"  (1.778 m), weight 78.1 kg, SpO2 98 %.   Exam:  Awake Alert, No new F.N deficits, Normal affect [place].AT,PERRAL Supple Neck, No JVD,   Symmetrical Chest wall movement, Good air movement bilaterally, CTAB RRR,No Gallops, Rubs or new Murmurs,  +ve B.Sounds,  Abd Soft, No tenderness,   No Cyanosis, Clubbing or edema    Assessment/Plan:  Sepsis due to recurrent MSSA bacteremia with native tricuspid valve endocarditis: Afebrile-nontoxic-appearing-plan is to continue IV antibiotics until 04/10/22, has a right lower arm PICC line.  Will need to remain inpatient until patient completes a course of IV antibiotics.  Will need to reconsult infectious disease for further recommendations prior to discharge.  Normocytic anemia: Due to chronic disease/recurrent endocarditis: No indication for transfusion.  Anxiety: Stable-on Lexapro-Xanax be slowly weaned down.  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   Weight as of this encounter: 78.1 kg.   Code status:   Code Status: Full Code   DVT Prophylaxis: enoxaparin (LOVENOX) injection 40 mg Start: 02/25/22 1700   Family Communication: None at bedside   Disposition Plan: Status is: Inpatient Remains inpatient appropriate because: Remain inpatient until 9/6 till patient completes course of IV antibiotics.   Planned Discharge Destination:Home after patient completes a course of IV antibiotics-stop date 9/6.   Diet: Diet Order             Diet regular Room service appropriate? Yes; Fluid consistency: Thin  Diet effective now                    MEDICATIONS: Scheduled Meds:  Chlorhexidine Gluconate Cloth  6 each Topical Daily   enoxaparin (LOVENOX) injection  40 mg Subcutaneous Q24H   escitalopram  10 mg Oral Daily   feeding supplement  237 mL Oral BID BM   methadone  135 mg Oral Daily    multivitamin with minerals  1 tablet Oral Daily   nicotine  14 mg Transdermal Daily   traZODone  50 mg Oral QHS   Continuous Infusions:  DAPTOmycin (CUBICIN) 750 mg in sodium chloride 0.9 % IVPB Stopped (03/30/22 2040)   sodium chloride     PRN Meds:.acetaminophen, ALPRAZolam, alum & mag hydroxide-simeth, bisacodyl, diclofenac Sodium, docusate sodium, hydrOXYzine, loperamide, methocarbamol, polyethylene glycol, promethazine, sodium chloride, sodium chloride flush   I have personally reviewed following labs and imaging studies  LABORATORY DATA: Recent Labs  Lab 03/25/22 0410 03/28/22 0341  WBC 7.6 6.8  HGB 8.9* 9.2*  HCT 28.9* 29.8*  PLT 272 263  MCV 86.3 86.6  MCH 26.6 26.7  MCHC 30.8 30.9  RDW 16.8* 17.2*  LYMPHSABS 2.4 2.2  MONOABS 0.7 0.6  EOSABS 0.2 0.2  BASOSABS 0.0 0.1    Recent Labs  Lab 03/25/22 0410 03/28/22 0341  NA 138 136  K 4.4 4.5  CL 103 101  CO2 29 30  GLUCOSE 95 103*  BUN 33* 29*  CREATININE 0.74 0.83  CALCIUM 9.4 9.3  AST 15 27  ALT 14 26  ALKPHOS 73 80  BILITOT 0.3 0.4  ALBUMIN 3.0* 3.1*  MG 1.9 2.0    MICROBIOLOGY: No results found for this or any previous visit (from the past 240 hour(s)).  RADIOLOGY STUDIES/RESULTS: No results found.   LOS: 34 days   Signature  Susa Raring M.D on 03/31/2022 at 10:57 AM   -  To page go to [email or web address]

## 2022-04-01 ENCOUNTER — Inpatient Hospital Stay (HOSPITAL_COMMUNITY): Payer: BLUE CROSS/BLUE SHIELD

## 2022-04-01 DIAGNOSIS — R7989 Other specified abnormal findings of blood chemistry: Secondary | ICD-10-CM | POA: Diagnosis not present

## 2022-04-01 DIAGNOSIS — G894 Chronic pain syndrome: Secondary | ICD-10-CM | POA: Diagnosis not present

## 2022-04-01 DIAGNOSIS — B182 Chronic viral hepatitis C: Secondary | ICD-10-CM | POA: Diagnosis not present

## 2022-04-01 LAB — COMPREHENSIVE METABOLIC PANEL
ALT: 101 U/L — ABNORMAL HIGH (ref 0–44)
AST: 75 U/L — ABNORMAL HIGH (ref 15–41)
Albumin: 3.1 g/dL — ABNORMAL LOW (ref 3.5–5.0)
Alkaline Phosphatase: 79 U/L (ref 38–126)
Anion gap: 5 (ref 5–15)
BUN: 24 mg/dL — ABNORMAL HIGH (ref 6–20)
CO2: 29 mmol/L (ref 22–32)
Calcium: 9 mg/dL (ref 8.9–10.3)
Chloride: 102 mmol/L (ref 98–111)
Creatinine, Ser: 0.77 mg/dL (ref 0.61–1.24)
GFR, Estimated: >60 mL/min (ref >60)
Glucose, Bld: 113 mg/dL — ABNORMAL HIGH (ref 70–99)
Potassium: 4.1 mmol/L (ref 3.5–5.1)
Sodium: 136 mmol/L (ref 135–145)
Total Bilirubin: 0.4 mg/dL (ref 0.3–1.2)
Total Protein: 7.2 g/dL (ref 6.5–8.1)

## 2022-04-01 LAB — CBC WITH DIFFERENTIAL/PLATELET
Abs Immature Granulocytes: 0.02 10*3/uL (ref 0.00–0.07)
Basophils Absolute: 0 10*3/uL (ref 0.0–0.1)
Basophils Relative: 0 %
Eosinophils Absolute: 0.2 10*3/uL (ref 0.0–0.5)
Eosinophils Relative: 3 %
HCT: 29.3 % — ABNORMAL LOW (ref 39.0–52.0)
Hemoglobin: 9.1 g/dL — ABNORMAL LOW (ref 13.0–17.0)
Immature Granulocytes: 0 %
Lymphocytes Relative: 35 %
Lymphs Abs: 2.1 10*3/uL (ref 0.7–4.0)
MCH: 26.8 pg (ref 26.0–34.0)
MCHC: 31.1 g/dL (ref 30.0–36.0)
MCV: 86.4 fL (ref 80.0–100.0)
Monocytes Absolute: 0.5 10*3/uL (ref 0.1–1.0)
Monocytes Relative: 8 %
Neutro Abs: 3.3 10*3/uL (ref 1.7–7.7)
Neutrophils Relative %: 54 %
Platelets: 217 10*3/uL (ref 150–400)
RBC: 3.39 MIL/uL — ABNORMAL LOW (ref 4.22–5.81)
RDW: 17.5 % — ABNORMAL HIGH (ref 11.5–15.5)
WBC: 6.1 10*3/uL (ref 4.0–10.5)
nRBC: 0 % (ref 0.0–0.2)

## 2022-04-01 LAB — PROTIME-INR
INR: 1.2 (ref 0.8–1.2)
Prothrombin Time: 14.8 seconds (ref 11.4–15.2)

## 2022-04-01 LAB — CK: Total CK: 21 U/L — ABNORMAL LOW (ref 49–397)

## 2022-04-01 LAB — MAGNESIUM: Magnesium: 1.9 mg/dL (ref 1.7–2.4)

## 2022-04-01 NOTE — Progress Notes (Signed)
PROGRESS NOTE        PATIENT DETAILS Name: Reginald Clarke Age: 31 y.o. Sex: male Date of Birth: Sep 02, 1990 Admit Date: 02/25/2022 Admitting Physician Dorcas Carrow, MD JJO:[id number], [name], PA-C  Brief Summary: Patient is a 31 y.o.  male with history of recurrent MSSA native tricuspid valve endocarditis  Significant events: 4/12-5/10>> hospitalization for MSSA bacteremia with native tricuspid valve endocarditis.  Underwent angio vac procedure on 4/26.Completed cefazolin on 5/10-planned weekly dalbavancin at the ID clinic.   5/22>> outpatient blood culture positive for MSSA 5/24-6/8>> admit to [facility] for persistent bacteremia-worsening back pain-discharge to [facility] on IV Ancef. 7/5>> discharged from [facility] on oral cefdinir.  Continued to have intermittent fever postdischarge.   7/24>> admit for-persistent fever/nausea/vomiting/diarrhea-blood cultures positive for MSSA.  Significant studies: 7/25>> CT chest: Multifocal airspace opacities-increasing cavitary nodules 7/25>> MRI thoracic spine: No evidence of infection in thoracic spine.  Significant microbiology data: 7/24>> urine culture: Negative 7/25>> blood culture: 1/2-MSSA. 7/27>> blood culture: No growth 7/29>> blood culture: No growth  Procedures: 8/3>> TEE: 3 vegetations on tricuspid valve-severe TR.  EF 60-65%  Consults: ID  Subjective:  Patient in bed, appears comfortable, denies any headache, no fever, no chest pain or pressure, no shortness of breath , no abdominal pain. No new focal weakness.    Objective: Vitals: Blood pressure (!) 90/49, pulse 74, temperature (!) 97.4 F (36.3 C), temperature source Oral, resp. rate 16, height 5\' 10"  (1.778 m), weight 78.5 kg, SpO2 97 %.   Exam:  Awake Alert, No new F.N deficits, Normal affect [place].AT,PERRAL Supple Neck, No JVD,   Symmetrical Chest wall movement, Good air movement bilaterally, CTAB RRR,No Gallops, Rubs or new Murmurs,  +ve  B.Sounds, Abd Soft, No tenderness,   No Cyanosis, Clubbing or edema     Assessment/Plan:  Sepsis due to recurrent MSSA bacteremia with native tricuspid valve endocarditis: Afebrile-nontoxic-appearing-plan is to continue IV antibiotics until 04/10/22, has a right lower arm PICC line.  Will need to remain inpatient until patient completes a course of IV antibiotics.  Will need to reconsult infectious disease for further recommendations prior to discharge.  Normocytic anemia: Due to chronic disease/recurrent endocarditis: No indication for transfusion.  Anxiety: Stable-on Lexapro-Xanax be slowly weaned down.  Methamphetamine use: In remission  Chronic methadone use  Chronic HCV - mild ovation in LFTs on 04/01/2022, symptom-free INR stable, check right upper quadrant ultrasound and trend.  ? if this is due to daptomycin.   Nutrition Status: Nutrition Problem: Inadequate oral intake Etiology: vomiting, nausea Signs/Symptoms: per patient/family report Interventions: Ensure Enlive (each supplement provides 350kcal and 20 grams of protein), MVI, Snacks  BMI: Estimated body mass index is 24.83 kg/m as calculated from the following:   Height as of this encounter: 5\' 10"  (1.778 m).   Weight as of this encounter: 78.5 kg.   Code status:   Code Status: Full Code   DVT Prophylaxis: enoxaparin (LOVENOX) injection 40 mg Start: 02/25/22 1700   Family Communication: None at bedside   Disposition Plan: Status is: Inpatient Remains inpatient appropriate because: Remain inpatient until 9/6 till patient completes course of IV antibiotics.   Planned Discharge Destination:Home after patient completes a course of IV antibiotics-stop date 9/6.   Diet: Diet Order             Diet regular Room service appropriate? Yes; Fluid consistency: Thin  Diet  effective now                    MEDICATIONS: Scheduled Meds:  Chlorhexidine Gluconate Cloth  6 each Topical Daily   enoxaparin (LOVENOX)  injection  40 mg Subcutaneous Q24H   escitalopram  10 mg Oral Daily   feeding supplement  237 mL Oral BID BM   methadone  135 mg Oral Daily   multivitamin with minerals  1 tablet Oral Daily   nicotine  14 mg Transdermal Daily   traZODone  50 mg Oral QHS   Continuous Infusions:  DAPTOmycin (CUBICIN) 750 mg in sodium chloride 0.9 % IVPB 750 mg (03/31/22 2028)   sodium chloride     PRN Meds:.acetaminophen, ALPRAZolam, alum & mag hydroxide-simeth, bisacodyl, diclofenac Sodium, docusate sodium, hydrOXYzine, loperamide, methocarbamol, polyethylene glycol, promethazine, sodium chloride, sodium chloride flush   I have personally reviewed following labs and imaging studies  LABORATORY DATA: Recent Labs  Lab 03/28/22 0341 04/01/22 0330  WBC 6.8 6.1  HGB 9.2* 9.1*  HCT 29.8* 29.3*  PLT 263 217  MCV 86.6 86.4  MCH 26.7 26.8  MCHC 30.9 31.1  RDW 17.2* 17.5*  LYMPHSABS 2.2 2.1  MONOABS 0.6 0.5  EOSABS 0.2 0.2  BASOSABS 0.1 0.0    Recent Labs  Lab 03/28/22 0341 04/01/22 0330 04/01/22 0931  NA 136 136  --   K 4.5 4.1  --   CL 101 102  --   CO2 30 29  --   GLUCOSE 103* 113*  --   BUN 29* 24*  --   CREATININE 0.83 0.77  --   CALCIUM 9.3 9.0  --   AST 27 75*  --   ALT 26 101*  --   ALKPHOS 80 79  --   BILITOT 0.4 0.4  --   ALBUMIN 3.1* 3.1*  --   MG 2.0 1.9  --   INR  --   --  1.2    MICROBIOLOGY: No results found for this or any previous visit (from the past 240 hour(s)).  RADIOLOGY STUDIES/RESULTS: No results found.   LOS: 35 days   Signature  Susa Raring M.D on 04/01/2022 at 11:39 AM   -  To page go to [email or web address]

## 2022-04-02 DIAGNOSIS — G894 Chronic pain syndrome: Secondary | ICD-10-CM | POA: Diagnosis not present

## 2022-04-02 DIAGNOSIS — R7989 Other specified abnormal findings of blood chemistry: Secondary | ICD-10-CM | POA: Diagnosis not present

## 2022-04-02 DIAGNOSIS — B182 Chronic viral hepatitis C: Secondary | ICD-10-CM | POA: Diagnosis not present

## 2022-04-02 LAB — COMPREHENSIVE METABOLIC PANEL
ALT: 114 U/L — ABNORMAL HIGH (ref 0–44)
AST: 79 U/L — ABNORMAL HIGH (ref 15–41)
Albumin: 3.1 g/dL — ABNORMAL LOW (ref 3.5–5.0)
Alkaline Phosphatase: 77 U/L (ref 38–126)
Anion gap: 7 (ref 5–15)
BUN: 21 mg/dL — ABNORMAL HIGH (ref 6–20)
CO2: 28 mmol/L (ref 22–32)
Calcium: 9.2 mg/dL (ref 8.9–10.3)
Chloride: 101 mmol/L (ref 98–111)
Creatinine, Ser: 0.78 mg/dL (ref 0.61–1.24)
GFR, Estimated: >60 mL/min (ref >60)
Glucose, Bld: 100 mg/dL — ABNORMAL HIGH (ref 70–99)
Potassium: 3.9 mmol/L (ref 3.5–5.1)
Sodium: 136 mmol/L (ref 135–145)
Total Bilirubin: 0.3 mg/dL (ref 0.3–1.2)
Total Protein: 6.9 g/dL (ref 6.5–8.1)

## 2022-04-02 LAB — CBC
HCT: 29.6 % — ABNORMAL LOW (ref 39.0–52.0)
Hemoglobin: 9.4 g/dL — ABNORMAL LOW (ref 13.0–17.0)
MCH: 27.4 pg (ref 26.0–34.0)
MCHC: 31.8 g/dL (ref 30.0–36.0)
MCV: 86.3 fL (ref 80.0–100.0)
Platelets: 215 10*3/uL (ref 150–400)
RBC: 3.43 MIL/uL — ABNORMAL LOW (ref 4.22–5.81)
RDW: 17.7 % — ABNORMAL HIGH (ref 11.5–15.5)
WBC: 6.3 10*3/uL (ref 4.0–10.5)
nRBC: 0 % (ref 0.0–0.2)

## 2022-04-02 NOTE — Progress Notes (Signed)
PROGRESS NOTE        PATIENT DETAILS Name: Reginald Clarke Age: 31 y.o. Sex: male Date of Birth: 1990/11/13 Admit Date: 02/25/2022 Admitting Physician Dorcas Carrow, MD IWL:[id number], [name], PA-C  Brief Summary: Patient is a 31 y.o.  male with history of recurrent MSSA native tricuspid valve endocarditis  Significant events: 4/12-5/10>> hospitalization for MSSA bacteremia with native tricuspid valve endocarditis.  Underwent angio vac procedure on 4/26.Completed cefazolin on 5/10-planned weekly dalbavancin at the ID clinic.   5/22>> outpatient blood culture positive for MSSA 5/24-6/8>> admit to [facility] for persistent bacteremia-worsening back pain-discharge to [facility] on IV Ancef. 7/5>> discharged from [facility] on oral cefdinir.  Continued to have intermittent fever postdischarge.   7/24>> admit for-persistent fever/nausea/vomiting/diarrhea-blood cultures positive for MSSA.  Significant studies: 7/25>> CT chest: Multifocal airspace opacities-increasing cavitary nodules 7/25>> MRI thoracic spine: No evidence of infection in thoracic spine.  Significant microbiology data: 7/24>> urine culture: Negative 7/25>> blood culture: 1/2-MSSA. 7/27>> blood culture: No growth 7/29>> blood culture: No growth  Procedures: 8/3>> TEE: 3 vegetations on tricuspid valve-severe TR.  EF 60-65%  Consults: ID  Subjective:  Patient in bed, appears comfortable, denies any headache, no fever, no chest pain or pressure, no shortness of breath , no abdominal pain. No new focal weakness.   Objective: Vitals: Blood pressure 95/60, pulse 83, temperature 98.2 F (36.8 C), temperature source Oral, resp. rate 18, height 5\' 10"  (1.778 m), weight 78.5 kg, SpO2 97 %.   Exam:  Awake Alert, No new F.N deficits, Normal affect [place].AT,PERRAL Supple Neck, No JVD,   Symmetrical Chest wall movement, Good air movement bilaterally, CTAB RRR,No Gallops, Rubs or new Murmurs,  +ve B.Sounds, Abd  Soft, No tenderness,   No Cyanosis, Clubbing or edema   Assessment/Plan:  Sepsis due to recurrent MSSA bacteremia with native tricuspid valve endocarditis: Afebrile-nontoxic-appearing-plan is to continue IV antibiotics until 04/10/22, has a right lower arm PICC line.  Will need to remain inpatient until patient completes a course of IV antibiotics.  Will need to reconsult infectious disease for further recommendations prior to discharge.  Normocytic anemia: Due to chronic disease/recurrent endocarditis: No indication for transfusion.  Anxiety: Stable-on Lexapro-Xanax be slowly weaned down.  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  Mild transaminitis.  Asymptomatic.  Does have chronic hepatitis C but LFTs have risen up in the last few days, asymptomatic, ultrasound stable.  Likely due to daptomycin.  Will request ID to evaluate antibiotics.   Nutrition Status: Nutrition Problem: Inadequate oral intake Etiology: vomiting, nausea Signs/Symptoms: per patient/family report Interventions: Ensure Enlive (each supplement provides 350kcal and 20 grams of protein), MVI, Snacks  BMI: Estimated body mass index is 24.83 kg/m as calculated from the following:   Height as of this encounter: 5\' 10"  (1.778 m).   Weight as of this encounter: 78.5 kg.   Code status:   Code Status: Full Code   DVT Prophylaxis: enoxaparin (LOVENOX) injection 40 mg Start: 02/25/22 1700   Family Communication: None at bedside   Disposition Plan: Status is: Inpatient Remains inpatient appropriate because: Remain inpatient until 9/6 till patient completes course of IV antibiotics.   Planned Discharge Destination:Home after patient completes a course of IV antibiotics-stop date 9/6.   Diet: Diet Order  Diet regular Room service appropriate? Yes; Fluid consistency: Thin  Diet effective now                     MEDICATIONS: Scheduled Meds:  Chlorhexidine Gluconate Cloth  6 each Topical Daily   enoxaparin (LOVENOX) injection  40 mg Subcutaneous Q24H   escitalopram  10 mg Oral Daily   feeding supplement  237 mL Oral BID BM   methadone  135 mg Oral Daily   multivitamin with minerals  1 tablet Oral Daily   nicotine  14 mg Transdermal Daily   traZODone  50 mg Oral QHS   Continuous Infusions:  DAPTOmycin (CUBICIN) 750 mg in sodium chloride 0.9 % IVPB 750 mg (04/01/22 2109)   sodium chloride     PRN Meds:.acetaminophen, ALPRAZolam, alum & mag hydroxide-simeth, bisacodyl, diclofenac Sodium, docusate sodium, hydrOXYzine, loperamide, methocarbamol, polyethylene glycol, promethazine, sodium chloride, sodium chloride flush   I have personally reviewed following labs and imaging studies  LABORATORY DATA: Recent Labs  Lab 03/28/22 0341 04/01/22 0330 04/02/22 0428  WBC 6.8 6.1 6.3  HGB 9.2* 9.1* 9.4*  HCT 29.8* 29.3* 29.6*  PLT 263 217 215  MCV 86.6 86.4 86.3  MCH 26.7 26.8 27.4  MCHC 30.9 31.1 31.8  RDW 17.2* 17.5* 17.7*  LYMPHSABS 2.2 2.1  --   MONOABS 0.6 0.5  --   EOSABS 0.2 0.2  --   BASOSABS 0.1 0.0  --     Recent Labs  Lab 03/28/22 0341 04/01/22 0330 04/01/22 0931 04/02/22 0428  NA 136 136  --  136  K 4.5 4.1  --  3.9  CL 101 102  --  101  CO2 30 29  --  28  GLUCOSE 103* 113*  --  100*  BUN 29* 24*  --  21*  CREATININE 0.83 0.77  --  0.78  CALCIUM 9.3 9.0  --  9.2  AST 27 75*  --  79*  ALT 26 101*  --  114*  ALKPHOS 80 79  --  77  BILITOT 0.4 0.4  --  0.3  ALBUMIN 3.1* 3.1*  --  3.1*  MG 2.0 1.9  --   --   INR  --   --  1.2  --     MICROBIOLOGY: No results found for this or any previous visit (from the past 240 hour(s)).  RADIOLOGY STUDIES/RESULTS: US Abdomen Limited RUQ (LIVER/GB)  Result Date: 04/01/2022 CLINICAL DATA:  Transaminitis EXAM: ULTRASOUND ABDOMEN LIMITED RIGHT UPPER QUADRANT COMPARISON:  None Available. FINDINGS: Gallbladder: No gallstones or  wall thickening visualized. No sonographic Murphy sign noted by sonographer. Common bile duct: Diameter: 0.5 cm Liver: No focal lesion identified. Within normal limits in parenchymal echogenicity. Portal vein is patent on color Doppler imaging with normal direction of blood flow towards the liver. Other: None. IMPRESSION: No ultrasound abnormality of the right upper quadrant to explain abnormal LFTs. No acute findings. Consider CT or MRI to further evaluate if clinically indicated. Electronically Signed   By: Jearld Lesch M.D.   On: 04/01/2022 11:38     LOS: 36 days   Signature  Susa Raring M.D on 04/02/2022 at 8:11 AM   -  To page go to [email or web address]

## 2022-04-03 DIAGNOSIS — B182 Chronic viral hepatitis C: Secondary | ICD-10-CM | POA: Diagnosis not present

## 2022-04-03 DIAGNOSIS — G894 Chronic pain syndrome: Secondary | ICD-10-CM | POA: Diagnosis not present

## 2022-04-03 DIAGNOSIS — R7989 Other specified abnormal findings of blood chemistry: Secondary | ICD-10-CM | POA: Diagnosis not present

## 2022-04-03 LAB — COMPREHENSIVE METABOLIC PANEL
ALT: 121 U/L — ABNORMAL HIGH (ref 0–44)
AST: 77 U/L — ABNORMAL HIGH (ref 15–41)
Albumin: 3.1 g/dL — ABNORMAL LOW (ref 3.5–5.0)
Alkaline Phosphatase: 74 U/L (ref 38–126)
Anion gap: 12 (ref 5–15)
BUN: 23 mg/dL — ABNORMAL HIGH (ref 6–20)
CO2: 29 mmol/L (ref 22–32)
Calcium: 9.6 mg/dL (ref 8.9–10.3)
Chloride: 97 mmol/L — ABNORMAL LOW (ref 98–111)
Creatinine, Ser: 0.84 mg/dL (ref 0.61–1.24)
GFR, Estimated: >60 mL/min (ref >60)
Glucose, Bld: 90 mg/dL (ref 70–99)
Potassium: 4.1 mmol/L (ref 3.5–5.1)
Sodium: 138 mmol/L (ref 135–145)
Total Bilirubin: 0.3 mg/dL (ref 0.3–1.2)
Total Protein: 6.9 g/dL (ref 6.5–8.1)

## 2022-04-03 NOTE — Progress Notes (Signed)
PROGRESS NOTE        PATIENT DETAILS Name: Reginald Clarke Age: 31 y.o. Sex: male Date of Birth: [birth year] Admit Date: 02/25/2022 Admitting Physician Dorcas Carrow, MD [id number], [name], PA-C  Brief Summary: Patient is a 31 y.o.  male with history of recurrent MSSA native tricuspid valve endocarditis  Significant events: 4/12-5/10>> hospitalization for MSSA bacteremia with native tricuspid valve endocarditis.  Underwent angio vac procedure on 4/26.Completed cefazolin on 5/10-planned weekly dalbavancin at the ID clinic.   5/22>> outpatient blood culture positive for MSSA 5/24-6/8>> admit to [facility] for persistent bacteremia-worsening back pain-discharge to [facility] on IV Ancef. 7/5>> discharged from [facility] on oral cefdinir.  Continued to have intermittent fever postdischarge.   7/24>> admit for-persistent fever/nausea/vomiting/diarrhea-blood cultures positive for MSSA.  Significant studies: 7/25>> CT chest: Multifocal airspace opacities-increasing cavitary nodules 7/25>> MRI thoracic spine: No evidence of infection in thoracic spine.  Significant microbiology data: 7/24>> urine culture: Negative 7/25>> blood culture: 1/2-MSSA. 7/27>> blood culture: No growth 7/29>> blood culture: No growth  Procedures: 8/3>> TEE: 3 vegetations on tricuspid valve-severe TR.  EF 60-65%  Consults: ID  Subjective:  Patient in bed, appears comfortable, denies any headache, no fever, no chest pain or pressure, no shortness of breath , no abdominal pain. No new focal weakness.    Objective: Vitals: Blood pressure (!) 99/56, pulse 76, temperature 97.8 F (36.6 C), temperature source Oral, resp. rate 17, height 5\' 10"  (1.778 m), weight 78.5 kg, SpO2 99 %.   Exam:  Awake Alert, No new F.N deficits, Normal affect [place].AT,PERRAL Supple Neck, No JVD,   Symmetrical Chest wall movement, Good air movement bilaterally, CTAB RRR,No Gallops, Rubs or new Murmurs,  +ve B.Sounds,  Abd Soft, No tenderness,   No Cyanosis, Clubbing or edema    Assessment/Plan:  Sepsis due to recurrent MSSA bacteremia with native tricuspid valve endocarditis: Afebrile-nontoxic-appearing-plan is to continue IV antibiotics until 04/10/22, has a right lower arm PICC line.  Will need to remain inpatient until patient completes a course of IV antibiotics.  Will need to reconsult infectious disease for further recommendations prior to discharge.  Normocytic anemia: Due to chronic disease/recurrent endocarditis: No indication for transfusion.  Anxiety: Stable-on Lexapro-Xanax be slowly weaned down.  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  Mild transaminitis.  Asymptomatic.  Does have chronic hepatitis C but LFTs have risen up in the last few days, asymptomatic, ultrasound stable.  Likely due to daptomycin.  ID monitoring.   Nutrition Status: Nutrition Problem: Inadequate oral intake Etiology: vomiting, nausea Signs/Symptoms: per patient/family report Interventions: Ensure Enlive (each supplement provides 350kcal and 20 grams of protein), MVI, Snacks  BMI: Estimated body mass index is 24.83 kg/m as calculated from the following:   Height as of this encounter: 5\' 10"  (1.778 m).   Weight as of this encounter: 78.5 kg.   Code status:   Code Status: Full Code   DVT Prophylaxis: enoxaparin (LOVENOX) injection 40 mg Start: 02/25/22 1700   Family Communication: None at bedside   Disposition Plan: Status is: Inpatient Remains inpatient appropriate because: Remain inpatient until 9/6 till patient completes course of IV antibiotics.   Planned Discharge Destination:Home after patient completes a course of IV antibiotics-stop date 9/6.   Diet: Diet Order             Diet  regular Room service appropriate? Yes; Fluid consistency: Thin  Diet effective now                    MEDICATIONS: Scheduled Meds:   Chlorhexidine Gluconate Cloth  6 each Topical Daily   enoxaparin (LOVENOX) injection  40 mg Subcutaneous Q24H   escitalopram  10 mg Oral Daily   feeding supplement  237 mL Oral BID BM   methadone  135 mg Oral Daily   multivitamin with minerals  1 tablet Oral Daily   nicotine  14 mg Transdermal Daily   traZODone  50 mg Oral QHS   Continuous Infusions:  DAPTOmycin (CUBICIN) 750 mg in sodium chloride 0.9 % IVPB 750 mg (04/02/22 2113)   sodium chloride     PRN Meds:.acetaminophen, ALPRAZolam, alum & mag hydroxide-simeth, bisacodyl, diclofenac Sodium, docusate sodium, hydrOXYzine, loperamide, methocarbamol, polyethylene glycol, promethazine, sodium chloride, sodium chloride flush   I have personally reviewed following labs and imaging studies  LABORATORY DATA: Recent Labs  Lab 03/28/22 0341 04/01/22 0330 04/02/22 0428  WBC 6.8 6.1 6.3  HGB 9.2* 9.1* 9.4*  HCT 29.8* 29.3* 29.6*  PLT 263 217 215  MCV 86.6 86.4 86.3  MCH 26.7 26.8 27.4  MCHC 30.9 31.1 31.8  RDW 17.2* 17.5* 17.7*  LYMPHSABS 2.2 2.1  --   MONOABS 0.6 0.5  --   EOSABS 0.2 0.2  --   BASOSABS 0.1 0.0  --     Recent Labs  Lab 03/28/22 0341 04/01/22 0330 04/01/22 0931 04/02/22 0428 04/03/22 0522  NA 136 136  --  136 138  K 4.5 4.1  --  3.9 4.1  CL 101 102  --  101 97*  CO2 30 29  --  28 29  GLUCOSE 103* 113*  --  100* 90  BUN 29* 24*  --  21* 23*  CREATININE 0.83 0.77  --  0.78 0.84  CALCIUM 9.3 9.0  --  9.2 9.6  AST 27 75*  --  79* 77*  ALT 26 101*  --  114* 121*  ALKPHOS 80 79  --  77 74  BILITOT 0.4 0.4  --  0.3 0.3  ALBUMIN 3.1* 3.1*  --  3.1* 3.1*  MG 2.0 1.9  --   --   --   INR  --   --  1.2  --   --     MICROBIOLOGY: No results found for this or any previous visit (from the past 240 hour(s)).  RADIOLOGY STUDIES/RESULTS: US Abdomen Limited RUQ (LIVER/GB)  Result Date: 04/01/2022 CLINICAL DATA:  Transaminitis EXAM: ULTRASOUND ABDOMEN LIMITED RIGHT UPPER QUADRANT COMPARISON:  None Available.  FINDINGS: Gallbladder: No gallstones or wall thickening visualized. No sonographic Murphy sign noted by sonographer. Common bile duct: Diameter: 0.5 cm Liver: No focal lesion identified. Within normal limits in parenchymal echogenicity. Portal vein is patent on color Doppler imaging with normal direction of blood flow towards the liver. Other: None. IMPRESSION: No ultrasound abnormality of the right upper quadrant to explain abnormal LFTs. No acute findings. Consider CT or MRI to further evaluate if clinically indicated. Electronically Signed   By: Jearld Lesch M.D.   On: 04/01/2022 11:38     LOS: 37 days   Signature  Susa Raring M.D on 04/03/2022 at 9:52 AM   -  To page go to [email or web address]

## 2022-04-04 DIAGNOSIS — B182 Chronic viral hepatitis C: Secondary | ICD-10-CM | POA: Diagnosis not present

## 2022-04-04 DIAGNOSIS — A4101 Sepsis due to Methicillin susceptible Staphylococcus aureus: Secondary | ICD-10-CM | POA: Diagnosis not present

## 2022-04-04 DIAGNOSIS — G894 Chronic pain syndrome: Secondary | ICD-10-CM | POA: Diagnosis not present

## 2022-04-04 LAB — CBC WITH DIFFERENTIAL/PLATELET
Abs Immature Granulocytes: 0.01 10*3/uL (ref 0.00–0.07)
Basophils Absolute: 0 10*3/uL (ref 0.0–0.1)
Basophils Relative: 0 %
Eosinophils Absolute: 0.3 10*3/uL (ref 0.0–0.5)
Eosinophils Relative: 4 %
HCT: 28.8 % — ABNORMAL LOW (ref 39.0–52.0)
Hemoglobin: 8.9 g/dL — ABNORMAL LOW (ref 13.0–17.0)
Immature Granulocytes: 0 %
Lymphocytes Relative: 30 %
Lymphs Abs: 1.7 10*3/uL (ref 0.7–4.0)
MCH: 27.2 pg (ref 26.0–34.0)
MCHC: 30.9 g/dL (ref 30.0–36.0)
MCV: 88.1 fL (ref 80.0–100.0)
Monocytes Absolute: 0.4 10*3/uL (ref 0.1–1.0)
Monocytes Relative: 7 %
Neutro Abs: 3.3 10*3/uL (ref 1.7–7.7)
Neutrophils Relative %: 59 %
Platelets: 168 10*3/uL (ref 150–400)
RBC: 3.27 MIL/uL — ABNORMAL LOW (ref 4.22–5.81)
RDW: 17.9 % — ABNORMAL HIGH (ref 11.5–15.5)
WBC: 5.7 10*3/uL (ref 4.0–10.5)
nRBC: 0 % (ref 0.0–0.2)

## 2022-04-04 LAB — COMPREHENSIVE METABOLIC PANEL
ALT: 117 U/L — ABNORMAL HIGH (ref 0–44)
AST: 70 U/L — ABNORMAL HIGH (ref 15–41)
Albumin: 3 g/dL — ABNORMAL LOW (ref 3.5–5.0)
Alkaline Phosphatase: 92 U/L (ref 38–126)
Anion gap: 6 (ref 5–15)
BUN: 25 mg/dL — ABNORMAL HIGH (ref 6–20)
CO2: 30 mmol/L (ref 22–32)
Calcium: 9.2 mg/dL (ref 8.9–10.3)
Chloride: 102 mmol/L (ref 98–111)
Creatinine, Ser: 0.76 mg/dL (ref 0.61–1.24)
GFR, Estimated: >60 mL/min (ref >60)
Glucose, Bld: 104 mg/dL — ABNORMAL HIGH (ref 70–99)
Potassium: 3.8 mmol/L (ref 3.5–5.1)
Sodium: 138 mmol/L (ref 135–145)
Total Bilirubin: 0.3 mg/dL (ref 0.3–1.2)
Total Protein: 6.7 g/dL (ref 6.5–8.1)

## 2022-04-04 LAB — MAGNESIUM: Magnesium: 1.9 mg/dL (ref 1.7–2.4)

## 2022-04-04 LAB — CK: Total CK: 19 U/L — ABNORMAL LOW (ref 49–397)

## 2022-04-04 NOTE — Progress Notes (Signed)
PROGRESS NOTE        PATIENT DETAILS Name: Reginald Clarke Age: 31 y.o. Sex: male Date of Birth: [birth year] Admit Date: 02/25/2022 Admitting Physician Dorcas Carrow, MD MOQ:[id number], [name], PA-C  Brief Summary: Patient is a 31 y.o.  male with history of recurrent MSSA native tricuspid valve endocarditis  Significant events: 4/12-5/10>> hospitalization for MSSA bacteremia with native tricuspid valve endocarditis.  Underwent angio vac procedure on 4/26.Completed cefazolin on 5/10-planned weekly dalbavancin at the ID clinic.   5/22>> outpatient blood culture positive for MSSA 5/24-6/8>> admit to [facility] for persistent bacteremia-worsening back pain-discharge to [facility] on IV Ancef. 7/5>> discharged from [facility] on oral cefdinir.  Continued to have intermittent fever postdischarge.   7/24>> admit for-persistent fever/nausea/vomiting/diarrhea-blood cultures positive for MSSA.  Significant studies: 7/25>> CT chest: Multifocal airspace opacities-increasing cavitary nodules 7/25>> MRI thoracic spine: No evidence of infection in thoracic spine.  Significant microbiology data: 7/24>> urine culture: Negative 7/25>> blood culture: 1/2-MSSA. 7/27>> blood culture: No growth 7/29>> blood culture: No growth  Procedures: 8/3>> TEE: 3 vegetations on tricuspid valve-severe TR.  EF 60-65%  Consults: ID  Subjective:  Patient in bed, appears comfortable, denies any headache, no fever, no chest pain or pressure, no shortness of breath , no abdominal pain. No new focal weakness.    Objective: Vitals: Blood pressure 94/61, pulse 98, temperature 97.9 F (36.6 C), temperature source Oral, resp. rate 18, height 5\' 10"  (1.778 m), weight 78.5 kg, SpO2 96 %.   Exam:  Awake Alert, No new F.N deficits, Normal affect [place].AT,PERRAL Supple Neck, No JVD,   Symmetrical Chest wall movement, Good air movement bilaterally, CTAB RRR,No Gallops, Rubs or new Murmurs,  +ve B.Sounds, Abd  Soft, No tenderness,   No Cyanosis, Clubbing or edema    Assessment/Plan:  Sepsis due to recurrent MSSA bacteremia with native tricuspid valve endocarditis: Afebrile-nontoxic-appearing-plan is to continue IV antibiotics until 04/10/22, has a right lower arm PICC line.  Will need to remain inpatient until patient completes a course of IV antibiotics.  Will need to reconsult infectious disease for further recommendations prior to discharge.  Normocytic anemia: Due to chronic disease/recurrent endocarditis: No indication for transfusion.  Anxiety: Stable-on Lexapro-Xanax be slowly weaned down.  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  Mild transaminitis.  Asymptomatic.  Does have chronic hepatitis C but LFTs have risen up in the last few days, asymptomatic, ultrasound stable.  Likely due to daptomycin.  ID monitoring.   Nutrition Status: Nutrition Problem: Inadequate oral intake Etiology: vomiting, nausea Signs/Symptoms: per patient/family report Interventions: Ensure Enlive (each supplement provides 350kcal and 20 grams of protein), MVI, Snacks  BMI: Estimated body mass index is 24.83 kg/m as calculated from the following:   Height as of this encounter: 5\' 10"  (1.778 m).   Weight as of this encounter: 78.5 kg.   Code status:   Code Status: Full Code   DVT Prophylaxis: enoxaparin (LOVENOX) injection 40 mg Start: 02/25/22 1700   Family Communication: None at bedside   Disposition Plan: Status is: Inpatient Remains inpatient appropriate because: Remain inpatient until 9/6 till patient completes course of IV antibiotics.   Planned Discharge Destination:Home after patient completes a course of IV antibiotics-stop date 9/6.   Diet: Diet Order             Diet regular  Room service appropriate? Yes; Fluid consistency: Thin  Diet effective now                    MEDICATIONS: Scheduled Meds:  Chlorhexidine  Gluconate Cloth  6 each Topical Daily   enoxaparin (LOVENOX) injection  40 mg Subcutaneous Q24H   escitalopram  10 mg Oral Daily   feeding supplement  237 mL Oral BID BM   methadone  135 mg Oral Daily   multivitamin with minerals  1 tablet Oral Daily   nicotine  14 mg Transdermal Daily   traZODone  50 mg Oral QHS   Continuous Infusions:  DAPTOmycin (CUBICIN) 750 mg in sodium chloride 0.9 % IVPB Stopped (04/04/22 0705)   sodium chloride     PRN Meds:.acetaminophen, ALPRAZolam, alum & mag hydroxide-simeth, bisacodyl, diclofenac Sodium, docusate sodium, hydrOXYzine, loperamide, methocarbamol, polyethylene glycol, promethazine, sodium chloride, sodium chloride flush   I have personally reviewed following labs and imaging studies  LABORATORY DATA: Recent Labs  Lab 04/01/22 0330 04/02/22 0428 04/04/22 0402  WBC 6.1 6.3 5.7  HGB 9.1* 9.4* 8.9*  HCT 29.3* 29.6* 28.8*  PLT 217 215 168  MCV 86.4 86.3 88.1  MCH 26.8 27.4 27.2  MCHC 31.1 31.8 30.9  RDW 17.5* 17.7* 17.9*  LYMPHSABS 2.1  --  1.7  MONOABS 0.5  --  0.4  EOSABS 0.2  --  0.3  BASOSABS 0.0  --  0.0    Recent Labs  Lab 04/01/22 0330 04/01/22 0931 04/02/22 0428 04/03/22 0522 04/04/22 0402  NA 136  --  136 138 138  K 4.1  --  3.9 4.1 3.8  CL 102  --  101 97* 102  CO2 29  --  28 29 30   GLUCOSE 113*  --  100* 90 104*  BUN 24*  --  21* 23* 25*  CREATININE 0.77  --  0.78 0.84 0.76  CALCIUM 9.0  --  9.2 9.6 9.2  AST 75*  --  79* 77* 70*  ALT 101*  --  114* 121* 117*  ALKPHOS 79  --  77 74 92  BILITOT 0.4  --  0.3 0.3 0.3  ALBUMIN 3.1*  --  3.1* 3.1* 3.0*  MG 1.9  --   --   --  1.9  INR  --  1.2  --   --   --     MICROBIOLOGY: No results found for this or any previous visit (from the past 240 hour(s)).  RADIOLOGY STUDIES/RESULTS: No results found.   LOS: 38 days   Signature  M.D on 04/04/2022 at 10:26 AM   -  To page go to [email or web address]

## 2022-04-05 DIAGNOSIS — F419 Anxiety disorder, unspecified: Secondary | ICD-10-CM | POA: Diagnosis not present

## 2022-04-05 LAB — COMPREHENSIVE METABOLIC PANEL
ALT: 119 U/L — ABNORMAL HIGH (ref 0–44)
AST: 72 U/L — ABNORMAL HIGH (ref 15–41)
Albumin: 3 g/dL — ABNORMAL LOW (ref 3.5–5.0)
Alkaline Phosphatase: 82 U/L (ref 38–126)
Anion gap: 6 (ref 5–15)
BUN: 22 mg/dL — ABNORMAL HIGH (ref 6–20)
CO2: 30 mmol/L (ref 22–32)
Calcium: 9 mg/dL (ref 8.9–10.3)
Chloride: 101 mmol/L (ref 98–111)
Creatinine, Ser: 0.8 mg/dL (ref 0.61–1.24)
GFR, Estimated: >60 mL/min (ref >60)
Glucose, Bld: 98 mg/dL (ref 70–99)
Potassium: 4.1 mmol/L (ref 3.5–5.1)
Sodium: 137 mmol/L (ref 135–145)
Total Bilirubin: 0.3 mg/dL (ref 0.3–1.2)
Total Protein: 6.9 g/dL (ref 6.5–8.1)

## 2022-04-05 NOTE — Progress Notes (Signed)
PROGRESS NOTE        PATIENT DETAILS Name: Reginald Clarke Age: 31 y.o. Sex: male Date of Birth: Mar 14, 1991 Admit Date: 02/25/2022 Admitting Physician Dorcas Carrow, MD PIR:[id number], [name], PA-C  Brief Summary: Patient is a 31 y.o.  male with history of recurrent MSSA native tricuspid valve endocarditis  Significant events: 4/12-5/10>> hospitalization for MSSA bacteremia with native tricuspid valve endocarditis.  Underwent angio vac procedure on 4/26.Completed cefazolin on 5/10-planned weekly dalbavancin at the ID clinic.   5/22>> outpatient blood culture positive for MSSA 5/24-6/8>> admit to [facility] for persistent bacteremia-worsening back pain-discharge to [facility] on IV Ancef. 7/5>> discharged from [facility] on oral cefdinir.  Continued to have intermittent fever postdischarge.   7/24>> admit for-persistent fever/nausea/vomiting/diarrhea-blood cultures positive for MSSA.  Significant studies: 7/25>> CT chest: Multifocal airspace opacities-increasing cavitary nodules 7/25>> MRI thoracic spine: No evidence of infection in thoracic spine.  Significant microbiology data: 7/24>> urine culture: Negative 7/25>> blood culture: 1/2-MSSA. 7/27>> blood culture: No growth 7/29>> blood culture: No growth  Procedures: 8/3>> TEE: 3 vegetations on tricuspid valve-severe TR.  EF 60-65%  Consults: ID  Subjective:  Patient in bed, appears comfortable, denies any headache, no fever, no chest pain or pressure, no shortness of breath , no abdominal pain. No new focal weakness.  Objective: Vitals: Blood pressure 101/69, pulse 80, temperature 97.7 F (36.5 C), temperature source Oral, resp. rate 20, height 5\' 10"  (1.778 m), weight 78.5 kg, SpO2 98 %.   Exam:  Awake Alert, No new F.N deficits, Normal affect [place].AT,PERRAL Supple Neck, No JVD,   Symmetrical Chest wall movement, Good air movement bilaterally, CTAB RRR,No Gallops, Rubs or new Murmurs,  +ve B.Sounds, Abd  Soft, No tenderness,   No Cyanosis, Clubbing or edema    Assessment/Plan:  Sepsis due to recurrent MSSA bacteremia with native tricuspid valve endocarditis: Afebrile-nontoxic-appearing-plan is to continue IV antibiotics until 04/10/22, has a right lower arm PICC line.  Will need to remain inpatient until patient completes a course of IV antibiotics.  Will need to reconsult infectious disease for further recommendations prior to discharge.  Normocytic anemia: Due to chronic disease/recurrent endocarditis: No indication for transfusion.  Anxiety: Stable-on Lexapro-Xanax be slowly weaned down.  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  Mild transaminitis.  Asymptomatic.  Does have chronic hepatitis C but LFTs have risen up in the last few days, asymptomatic, ultrasound stable.  Likely due to daptomycin.  ID monitoring.   Nutrition Status: Nutrition Problem: Inadequate oral intake Etiology: vomiting, nausea Signs/Symptoms: per patient/family report Interventions: Ensure Enlive (each supplement provides 350kcal and 20 grams of protein), MVI, Snacks  BMI: Estimated body mass index is 24.83 kg/m as calculated from the following:   Height as of this encounter: 5\' 10"  (1.778 m).   Weight as of this encounter: 78.5 kg.   Code status:   Code Status: Full Code   DVT Prophylaxis: enoxaparin (LOVENOX) injection 40 mg Start: 02/25/22 1700   Family Communication: None at bedside   Disposition Plan: Status is: Inpatient Remains inpatient appropriate because: Remain inpatient until 9/6 till patient completes course of IV antibiotics.   Planned Discharge Destination:Home after patient completes a course of IV antibiotics-stop date 9/6.   Diet: Diet Order             Diet regular Room service  appropriate? Yes; Fluid consistency: Thin  Diet effective now                    MEDICATIONS: Scheduled Meds:  Chlorhexidine  Gluconate Cloth  6 each Topical Daily   enoxaparin (LOVENOX) injection  40 mg Subcutaneous Q24H   escitalopram  10 mg Oral Daily   feeding supplement  237 mL Oral BID BM   methadone  135 mg Oral Daily   multivitamin with minerals  1 tablet Oral Daily   nicotine  14 mg Transdermal Daily   traZODone  50 mg Oral QHS   Continuous Infusions:  DAPTOmycin (CUBICIN) 750 mg in sodium chloride 0.9 % IVPB 750 mg (04/04/22 2006)   sodium chloride     PRN Meds:.acetaminophen, ALPRAZolam, alum & mag hydroxide-simeth, bisacodyl, diclofenac Sodium, docusate sodium, hydrOXYzine, loperamide, methocarbamol, polyethylene glycol, promethazine, sodium chloride, sodium chloride flush   I have personally reviewed following labs and imaging studies  LABORATORY DATA: Recent Labs  Lab 04/01/22 0330 04/02/22 0428 04/04/22 0402  WBC 6.1 6.3 5.7  HGB 9.1* 9.4* 8.9*  HCT 29.3* 29.6* 28.8*  PLT 217 215 168  MCV 86.4 86.3 88.1  MCH 26.8 27.4 27.2  MCHC 31.1 31.8 30.9  RDW 17.5* 17.7* 17.9*  LYMPHSABS 2.1  --  1.7  MONOABS 0.5  --  0.4  EOSABS 0.2  --  0.3  BASOSABS 0.0  --  0.0    Recent Labs  Lab 04/01/22 0330 04/01/22 0931 04/02/22 0428 04/03/22 0522 04/04/22 0402 04/05/22 0444  NA 136  --  136 138 138 137  K 4.1  --  3.9 4.1 3.8 4.1  CL 102  --  101 97* 102 101  CO2 29  --  28 29 30 30   GLUCOSE 113*  --  100* 90 104* 98  BUN 24*  --  21* 23* 25* 22*  CREATININE 0.77  --  0.78 0.84 0.76 0.80  CALCIUM 9.0  --  9.2 9.6 9.2 9.0  AST 75*  --  79* 77* 70* 72*  ALT 101*  --  114* 121* 117* 119*  ALKPHOS 79  --  77 74 92 82  BILITOT 0.4  --  0.3 0.3 0.3 0.3  ALBUMIN 3.1*  --  3.1* 3.1* 3.0* 3.0*  MG 1.9  --   --   --  1.9  --   INR  --  1.2  --   --   --   --     MICROBIOLOGY: No results found for this or any previous visit (from the past 240 hour(s)).  RADIOLOGY STUDIES/RESULTS: No results found.   LOS: 39 days   Signature  M.D on 04/05/2022 at 9:39 AM   -  To page go  to [email or web address]

## 2022-04-06 DIAGNOSIS — B182 Chronic viral hepatitis C: Secondary | ICD-10-CM | POA: Diagnosis not present

## 2022-04-06 DIAGNOSIS — G894 Chronic pain syndrome: Secondary | ICD-10-CM | POA: Diagnosis not present

## 2022-04-06 DIAGNOSIS — A4101 Sepsis due to Methicillin susceptible Staphylococcus aureus: Secondary | ICD-10-CM | POA: Diagnosis not present

## 2022-04-06 LAB — COMPREHENSIVE METABOLIC PANEL
ALT: 128 U/L — ABNORMAL HIGH (ref 0–44)
AST: 76 U/L — ABNORMAL HIGH (ref 15–41)
Albumin: 3.1 g/dL — ABNORMAL LOW (ref 3.5–5.0)
Alkaline Phosphatase: 86 U/L (ref 38–126)
Anion gap: 4 — ABNORMAL LOW (ref 5–15)
BUN: 17 mg/dL (ref 6–20)
CO2: 31 mmol/L (ref 22–32)
Calcium: 9.2 mg/dL (ref 8.9–10.3)
Chloride: 103 mmol/L (ref 98–111)
Creatinine, Ser: 0.81 mg/dL (ref 0.61–1.24)
GFR, Estimated: >60 mL/min (ref >60)
Glucose, Bld: 97 mg/dL (ref 70–99)
Potassium: 4.2 mmol/L (ref 3.5–5.1)
Sodium: 138 mmol/L (ref 135–145)
Total Bilirubin: 0.4 mg/dL (ref 0.3–1.2)
Total Protein: 6.8 g/dL (ref 6.5–8.1)

## 2022-04-06 LAB — PROTIME-INR
INR: 1.1 (ref 0.8–1.2)
Prothrombin Time: 14.3 seconds (ref 11.4–15.2)

## 2022-04-06 NOTE — Progress Notes (Signed)
PROGRESS NOTE        PATIENT DETAILS Name: Reginald Clarke Age: 31 y.o. Sex: male Date of Birth: 1990-12-12 Admit Date: 02/25/2022 Admitting Physician Dorcas Carrow, MD XBM:[id number], [name], PA-C  Brief Summary: Patient is a 31 y.o.  male with history of recurrent MSSA native tricuspid valve endocarditis  Significant events: 4/12-5/10>> hospitalization for MSSA bacteremia with native tricuspid valve endocarditis.  Underwent angio vac procedure on 4/26.Completed cefazolin on 5/10-planned weekly dalbavancin at the ID clinic.   5/22>> outpatient blood culture positive for MSSA 5/24-6/8>> admit to [facility] for persistent bacteremia-worsening back pain-discharge to [facility] on IV Ancef. 7/5>> discharged from [facility] on oral cefdinir.  Continued to have intermittent fever postdischarge.   7/24>> admit for-persistent fever/nausea/vomiting/diarrhea-blood cultures positive for MSSA.  Significant studies: 7/25>> CT chest: Multifocal airspace opacities-increasing cavitary nodules 7/25>> MRI thoracic spine: No evidence of infection in thoracic spine.  Significant microbiology data: 7/24>> urine culture: Negative 7/25>> blood culture: 1/2-MSSA. 7/27>> blood culture: No growth 7/29>> blood culture: No growth  Procedures:  8/3>> TEE: 3 vegetations on tricuspid valve-severe TR.  EF 60-65%  Consults: ID  Subjective: Patient in bed, appears comfortable, denies any headache, no fever, no chest pain or pressure, no shortness of breath , no abdominal pain. No focal weakness.  Objective: Vitals: Blood pressure (!) 101/58, pulse 87, temperature 98 F (36.7 C), temperature source Oral, resp. rate 18, height 5\' 10"  (1.778 m), weight 78.5 kg, SpO2 98 %.   Exam:  Awake Alert, No new F.N deficits, Normal affect [place].AT,PERRAL Supple Neck, No JVD,   Symmetrical Chest wall movement, Good air movement bilaterally, CTAB RRR,No Gallops, Rubs or new Murmurs,  +ve B.Sounds, Abd Soft,  No tenderness,   No Cyanosis, Clubbing or edema    Assessment/Plan:  Sepsis due to recurrent MSSA bacteremia with native tricuspid valve endocarditis: Afebrile-nontoxic-appearing-plan is to continue IV antibiotics until 04/10/22, has a right lower arm PICC line.  Will need to remain inpatient until patient completes a course of IV antibiotics.  Will need to reconsult infectious disease for further recommendations prior to discharge.  Normocytic anemia: Due to chronic disease/recurrent endocarditis: No indication for transfusion.  Anxiety: Stable-on Lexapro-Xanax be slowly weaned down.  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  Mild transaminitis.  Asymptomatic.  Does have chronic hepatitis C but LFTs have risen up in the last few days, asymptomatic, ultrasound stable.  Likely due to daptomycin.  ID monitoring.   Nutrition Status: Nutrition Problem: Inadequate oral intake Etiology: vomiting, nausea Signs/Symptoms: per patient/family report Interventions: Ensure Enlive (each supplement provides 350kcal and 20 grams of protein), MVI, Snacks  BMI: Estimated body mass index is 24.83 kg/m as calculated from the following:   Height as of this encounter: 5\' 10"  (1.778 m).   Weight as of this encounter: 78.5 kg.   Code status:   Code Status: Full Code   DVT Prophylaxis: enoxaparin (LOVENOX) injection 40 mg Start: 02/25/22 1700   Family Communication: None at bedside   Disposition Plan: Status is: Inpatient Remains inpatient appropriate because: Remain inpatient until 9/6 till patient completes course of IV antibiotics.   Planned Discharge Destination:Home after patient completes a course of IV antibiotics-stop date 9/6.   Diet: Diet Order             Diet regular Room service  appropriate? Yes; Fluid consistency: Thin  Diet effective now                    MEDICATIONS: Scheduled Meds:  Chlorhexidine  Gluconate Cloth  6 each Topical Daily   enoxaparin (LOVENOX) injection  40 mg Subcutaneous Q24H   escitalopram  10 mg Oral Daily   feeding supplement  237 mL Oral BID BM   methadone  135 mg Oral Daily   multivitamin with minerals  1 tablet Oral Daily   nicotine  14 mg Transdermal Daily   traZODone  50 mg Oral QHS   Continuous Infusions:  DAPTOmycin (CUBICIN) 750 mg in sodium chloride 0.9 % IVPB 750 mg (04/05/22 2059)   sodium chloride     PRN Meds:.acetaminophen, ALPRAZolam, alum & mag hydroxide-simeth, bisacodyl, diclofenac Sodium, docusate sodium, hydrOXYzine, loperamide, methocarbamol, polyethylene glycol, promethazine, sodium chloride, sodium chloride flush   I have personally reviewed following labs and imaging studies  LABORATORY DATA: Recent Labs  Lab 04/01/22 0330 04/02/22 0428 04/04/22 0402  WBC 6.1 6.3 5.7  HGB 9.1* 9.4* 8.9*  HCT 29.3* 29.6* 28.8*  PLT 217 215 168  MCV 86.4 86.3 88.1  MCH 26.8 27.4 27.2  MCHC 31.1 31.8 30.9  RDW 17.5* 17.7* 17.9*  LYMPHSABS 2.1  --  1.7  MONOABS 0.5  --  0.4  EOSABS 0.2  --  0.3  BASOSABS 0.0  --  0.0    Recent Labs  Lab 04/01/22 0330 04/01/22 0931 04/02/22 0428 04/03/22 0522 04/04/22 0402 04/05/22 0444 04/06/22 0449  NA 136  --  136 138 138 137 138  K 4.1  --  3.9 4.1 3.8 4.1 4.2  CL 102  --  101 97* 102 101 103  CO2 29  --  28 29 30 30 31   GLUCOSE 113*  --  100* 90 104* 98 97  BUN 24*  --  21* 23* 25* 22* 17  CREATININE 0.77  --  0.78 0.84 0.76 0.80 0.81  CALCIUM 9.0  --  9.2 9.6 9.2 9.0 9.2  AST 75*  --  79* 77* 70* 72* 76*  ALT 101*  --  114* 121* 117* 119* 128*  ALKPHOS 79  --  77 74 92 82 86  BILITOT 0.4  --  0.3 0.3 0.3 0.3 0.4  ALBUMIN 3.1*  --  3.1* 3.1* 3.0* 3.0* 3.1*  MG 1.9  --   --   --  1.9  --   --   INR  --  1.2  --   --   --   --  1.1    MICROBIOLOGY: No results found for this or any previous visit (from the past 240 hour(s)).  RADIOLOGY STUDIES/RESULTS: No results found.   LOS: 40 days    Signature  M.D on 04/06/2022 at 10:56 AM   -  To page go to [email or web address]

## 2022-04-07 DIAGNOSIS — A4101 Sepsis due to Methicillin susceptible Staphylococcus aureus: Secondary | ICD-10-CM | POA: Diagnosis not present

## 2022-04-07 DIAGNOSIS — B182 Chronic viral hepatitis C: Secondary | ICD-10-CM | POA: Diagnosis not present

## 2022-04-07 DIAGNOSIS — G894 Chronic pain syndrome: Secondary | ICD-10-CM | POA: Diagnosis not present

## 2022-04-07 NOTE — Progress Notes (Signed)
PROGRESS NOTE        PATIENT DETAILS Name: Reginald Clarke Age: 31 y.o. Sex: male Date of Birth: April 30, 1991 Admit Date: 02/25/2022 Admitting Physician Dorcas Carrow, MD [id number], [name], PA-C  Brief Summary: Patient is a 31 y.o.  male with history of recurrent MSSA native tricuspid valve endocarditis  Significant events: 4/12-5/10>> hospitalization for MSSA bacteremia with native tricuspid valve endocarditis.  Underwent angio vac procedure on 4/26.Completed cefazolin on 5/10-planned weekly dalbavancin at the ID clinic.   5/22>> outpatient blood culture positive for MSSA 5/24-6/8>> admit to [facility] for persistent bacteremia-worsening back pain-discharge to [facility] on IV Ancef. 7/5>> discharged from [facility] on oral cefdinir.  Continued to have intermittent fever postdischarge.   7/24>> admit for-persistent fever/nausea/vomiting/diarrhea-blood cultures positive for MSSA.  Significant studies: 7/25>> CT chest: Multifocal airspace opacities-increasing cavitary nodules 7/25>> MRI thoracic spine: No evidence of infection in thoracic spine.  Significant microbiology data: 7/24>> urine culture: Negative 7/25>> blood culture: 1/2-MSSA. 7/27>> blood culture: No growth 7/29>> blood culture: No growth  Procedures:  8/3>> TEE: 3 vegetations on tricuspid valve-severe TR.  EF 60-65%  Consults: ID  Subjective: Patient in bed, appears comfortable, denies any headache, no fever, no chest pain or pressure, no shortness of breath , no abdominal pain. No focal weakness.  Objective: Vitals: Blood pressure 101/65, pulse 100, temperature 98.2 F (36.8 C), temperature source Oral, resp. rate 16, height 5\' 10"  (1.778 m), weight 78.5 kg, SpO2 94 %.   Exam:  Awake Alert, No new F.N deficits, Normal affect [place].AT,PERRAL Supple Neck, No JVD,   Symmetrical Chest wall movement, Good air movement bilaterally, CTAB RRR,No Gallops, Rubs or new Murmurs,  +ve B.Sounds, Abd Soft,  No tenderness,   No Cyanosis, Clubbing or edema    Assessment/Plan:  Sepsis due to recurrent MSSA bacteremia with native tricuspid valve endocarditis: Afebrile-nontoxic-appearing-plan is to continue IV antibiotics until 04/10/22, has a right lower arm PICC line.  Will need to remain inpatient until patient completes a course of IV antibiotics.  Will need to reconsult infectious disease for further recommendations prior to discharge - call 04/09/22.  Normocytic anemia: Due to chronic disease/recurrent endocarditis: No indication for transfusion.  Anxiety: Stable-on Lexapro-Xanax be slowly weaned down.  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  Mild transaminitis.  Asymptomatic.  Does have chronic hepatitis C but LFTs have risen up in the last few days, asymptomatic, ultrasound stable.  Likely due to daptomycin.  ID monitoring.   Nutrition Status: Nutrition Problem: Inadequate oral intake Etiology: vomiting, nausea Signs/Symptoms: per patient/family report Interventions: Ensure Enlive (each supplement provides 350kcal and 20 grams of protein), MVI, Snacks  BMI: Estimated body mass index is 24.83 kg/m as calculated from the following:   Height as of this encounter: 5\' 10"  (1.778 m).   Weight as of this encounter: 78.5 kg.   Code status:   Code Status: Full Code   DVT Prophylaxis: enoxaparin (LOVENOX) injection 40 mg Start: 02/25/22 1700   Family Communication: None at bedside   Disposition Plan: Status is: Inpatient Remains inpatient appropriate because: Remain inpatient until 9/6 till patient completes course of IV antibiotics.   Planned Discharge Destination:Home after patient completes a course of IV antibiotics-stop date 9/6.   Diet: Diet Order             Diet regular  Room service appropriate? Yes; Fluid consistency: Thin  Diet effective now                    MEDICATIONS: Scheduled Meds:   Chlorhexidine Gluconate Cloth  6 each Topical Daily   enoxaparin (LOVENOX) injection  40 mg Subcutaneous Q24H   escitalopram  10 mg Oral Daily   feeding supplement  237 mL Oral BID BM   methadone  135 mg Oral Daily   multivitamin with minerals  1 tablet Oral Daily   nicotine  14 mg Transdermal Daily   traZODone  50 mg Oral QHS   Continuous Infusions:  DAPTOmycin (CUBICIN) 750 mg in sodium chloride 0.9 % IVPB 750 mg (04/06/22 2018)   sodium chloride     PRN Meds:.acetaminophen, ALPRAZolam, alum & mag hydroxide-simeth, bisacodyl, diclofenac Sodium, docusate sodium, hydrOXYzine, loperamide, methocarbamol, polyethylene glycol, promethazine, sodium chloride, sodium chloride flush   I have personally reviewed following labs and imaging studies  LABORATORY DATA: Recent Labs  Lab 04/01/22 0330 04/02/22 0428 04/04/22 0402  WBC 6.1 6.3 5.7  HGB 9.1* 9.4* 8.9*  HCT 29.3* 29.6* 28.8*  PLT 217 215 168  MCV 86.4 86.3 88.1  MCH 26.8 27.4 27.2  MCHC 31.1 31.8 30.9  RDW 17.5* 17.7* 17.9*  LYMPHSABS 2.1  --  1.7  MONOABS 0.5  --  0.4  EOSABS 0.2  --  0.3  BASOSABS 0.0  --  0.0    Recent Labs  Lab 04/01/22 0330 04/01/22 0931 04/02/22 0428 04/03/22 0522 04/04/22 0402 04/05/22 0444 04/06/22 0449  NA 136  --  136 138 138 137 138  K 4.1  --  3.9 4.1 3.8 4.1 4.2  CL 102  --  101 97* 102 101 103  CO2 29  --  28 29 30 30 31   GLUCOSE 113*  --  100* 90 104* 98 97  BUN 24*  --  21* 23* 25* 22* 17  CREATININE 0.77  --  0.78 0.84 0.76 0.80 0.81  CALCIUM 9.0  --  9.2 9.6 9.2 9.0 9.2  AST 75*  --  79* 77* 70* 72* 76*  ALT 101*  --  114* 121* 117* 119* 128*  ALKPHOS 79  --  77 74 92 82 86  BILITOT 0.4  --  0.3 0.3 0.3 0.3 0.4  ALBUMIN 3.1*  --  3.1* 3.1* 3.0* 3.0* 3.1*  MG 1.9  --   --   --  1.9  --   --   INR  --  1.2  --   --   --   --  1.1    MICROBIOLOGY: No results found for this or any previous visit (from the past 240 hour(s)).  RADIOLOGY STUDIES/RESULTS: No results  found.   LOS: 41 days   Signature  M.D on 04/07/2022 at 10:46 AM   -  To page go to [email or web address]

## 2022-04-08 ENCOUNTER — Inpatient Hospital Stay (HOSPITAL_COMMUNITY): Payer: BLUE CROSS/BLUE SHIELD

## 2022-04-08 DIAGNOSIS — I38 Endocarditis, valve unspecified: Secondary | ICD-10-CM | POA: Diagnosis not present

## 2022-04-08 DIAGNOSIS — F419 Anxiety disorder, unspecified: Secondary | ICD-10-CM | POA: Diagnosis not present

## 2022-04-08 LAB — CBC WITH DIFFERENTIAL/PLATELET
Abs Immature Granulocytes: 0.03 10*3/uL (ref 0.00–0.07)
Basophils Absolute: 0 10*3/uL (ref 0.0–0.1)
Basophils Relative: 0 %
Eosinophils Absolute: 0.3 10*3/uL (ref 0.0–0.5)
Eosinophils Relative: 4 %
HCT: 28.1 % — ABNORMAL LOW (ref 39.0–52.0)
Hemoglobin: 9 g/dL — ABNORMAL LOW (ref 13.0–17.0)
Immature Granulocytes: 0 %
Lymphocytes Relative: 19 %
Lymphs Abs: 1.3 10*3/uL (ref 0.7–4.0)
MCH: 28 pg (ref 26.0–34.0)
MCHC: 32 g/dL (ref 30.0–36.0)
MCV: 87.3 fL (ref 80.0–100.0)
Monocytes Absolute: 0.6 10*3/uL (ref 0.1–1.0)
Monocytes Relative: 9 %
Neutro Abs: 4.5 10*3/uL (ref 1.7–7.7)
Neutrophils Relative %: 68 %
Platelets: 109 10*3/uL — ABNORMAL LOW (ref 150–400)
RBC: 3.22 MIL/uL — ABNORMAL LOW (ref 4.22–5.81)
RDW: 17.9 % — ABNORMAL HIGH (ref 11.5–15.5)
WBC: 6.7 10*3/uL (ref 4.0–10.5)
nRBC: 0 % (ref 0.0–0.2)

## 2022-04-08 LAB — COMPREHENSIVE METABOLIC PANEL
ALT: 108 U/L — ABNORMAL HIGH (ref 0–44)
ALT: 103 U/L — ABNORMAL HIGH (ref 0–44)
AST: 54 U/L — ABNORMAL HIGH (ref 15–41)
AST: 55 U/L — ABNORMAL HIGH (ref 15–41)
Albumin: 3 g/dL — ABNORMAL LOW (ref 3.5–5.0)
Albumin: 3 g/dL — ABNORMAL LOW (ref 3.5–5.0)
Alkaline Phosphatase: 75 U/L (ref 38–126)
Alkaline Phosphatase: 86 U/L (ref 38–126)
Anion gap: 5 (ref 5–15)
Anion gap: 9 (ref 5–15)
BUN: 21 mg/dL — ABNORMAL HIGH (ref 6–20)
BUN: 25 mg/dL — ABNORMAL HIGH (ref 6–20)
CO2: 29 mmol/L (ref 22–32)
CO2: 26 mmol/L (ref 22–32)
Calcium: 9.1 mg/dL (ref 8.9–10.3)
Calcium: 9.1 mg/dL (ref 8.9–10.3)
Chloride: 101 mmol/L (ref 98–111)
Chloride: 100 mmol/L (ref 98–111)
Creatinine, Ser: 0.66 mg/dL (ref 0.61–1.24)
Creatinine, Ser: 0.68 mg/dL (ref 0.61–1.24)
GFR, Estimated: >60 mL/min (ref >60)
GFR, Estimated: >60 mL/min (ref >60)
Glucose, Bld: 101 mg/dL — ABNORMAL HIGH (ref 70–99)
Glucose, Bld: 93 mg/dL (ref 70–99)
Potassium: 4.2 mmol/L (ref 3.5–5.1)
Potassium: 4.1 mmol/L (ref 3.5–5.1)
Sodium: 135 mmol/L (ref 135–145)
Sodium: 135 mmol/L (ref 135–145)
Total Bilirubin: 0.6 mg/dL (ref 0.3–1.2)
Total Bilirubin: 0.3 mg/dL (ref 0.3–1.2)
Total Protein: 6.9 g/dL (ref 6.5–8.1)
Total Protein: 7 g/dL (ref 6.5–8.1)

## 2022-04-08 LAB — SARS CORONAVIRUS 2 BY RT PCR: SARS Coronavirus 2 by RT PCR: NEGATIVE

## 2022-04-08 LAB — ECHOCARDIOGRAM LIMITED
Height: 70 in
Weight: 2768.98 oz

## 2022-04-08 LAB — MAGNESIUM: Magnesium: 1.8 mg/dL (ref 1.7–2.4)

## 2022-04-08 LAB — PROCALCITONIN: Procalcitonin: <0.1 ng/mL

## 2022-04-08 LAB — CK: Total CK: 14 U/L — ABNORMAL LOW (ref 49–397)

## 2022-04-08 MED ORDER — LACTATED RINGERS IV BOLUS
1000.0000 mL | Freq: Once | INTRAVENOUS | Status: AC
  Administered 2022-04-08: 1000 mL via INTRAVENOUS

## 2022-04-08 MED ORDER — IBUPROFEN 200 MG PO TABS
600.0000 mg | ORAL_TABLET | Freq: Four times a day (QID) | ORAL | Status: DC | PRN
  Administered 2022-04-08 – 2022-04-20 (×6): 600 mg via ORAL
  Filled 2022-04-08 (×6): qty 1

## 2022-04-08 NOTE — Progress Notes (Signed)
PROGRESS NOTE        PATIENT DETAILS Name: Reginald Clarke Age: 31 y.o. Sex: male Date of Birth: [birth year] Admit Date: 02/25/2022 Admitting Physician Dorcas Carrow, MD [id number], [name], PA-C  Brief Summary: Patient is a 31 y.o.  male with history of recurrent MSSA native tricuspid valve endocarditis  Significant events: 4/12-5/10>> hospitalization for MSSA bacteremia with native tricuspid valve endocarditis.  Underwent angio vac procedure on 4/26.Completed cefazolin on 5/10-planned weekly dalbavancin at the ID clinic.   5/22>> outpatient blood culture positive for MSSA 5/24-6/8>> admit to [facility] for persistent bacteremia-worsening back pain-discharge to [facility] on IV Ancef. 7/5>> discharged from [facility] on oral cefdinir.  Continued to have intermittent fever postdischarge.   7/24>> admit for-persistent fever/nausea/vomiting/diarrhea-blood cultures positive for MSSA.  Significant studies: 7/25>> CT chest: Multifocal airspace opacities-increasing cavitary nodules 7/25>> MRI thoracic spine: No evidence of infection in thoracic spine.  Significant microbiology data: 7/24>> urine culture: Negative 7/25>> blood culture: 1/2-MSSA. 7/27>> blood culture: No growth 7/29>> blood culture: No growth  Procedures:  8/3>> TEE: 3 vegetations on tricuspid valve-severe TR.  EF 60-65%  Consults: ID  Subjective: Patient in bed, appears comfortable, denies any headache, no fever, no chest pain or pressure, no shortness of breath , no abdominal pain. No new focal weakness.   Objective: Vitals: Blood pressure 99/67, pulse 87, temperature 97.9 F (36.6 C), temperature source Oral, resp. rate 18, height 5\' 10"  (1.778 m), weight 78.5 kg, SpO2 96 %.   Exam:  Awake Alert, No new F.N deficits, Normal affect [place].AT,PERRAL Supple Neck, No JVD,   Symmetrical Chest wall movement, Good air movement bilaterally, CTAB RRR,No Gallops, Rubs or new Murmurs,  +ve B.Sounds, Abd  Soft, No tenderness,   No Cyanosis, Clubbing or edema     Assessment/Plan:  Sepsis due to recurrent MSSA bacteremia with native tricuspid valve endocarditis: Afebrile-nontoxic-appearing-plan is to continue IV antibiotics until 04/10/22, has a right lower arm PICC line.  Will need to remain inpatient until patient completes a course of IV antibiotics.  Will need to reconsult infectious disease for further recommendations prior to discharge - called 04/08/22.  Normocytic anemia: Due to chronic disease/recurrent endocarditis: No indication for transfusion.  Anxiety: Stable-on Lexapro-Xanax be slowly weaned down.  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  Mild transaminitis.  Asymptomatic.  Does have chronic hepatitis C but LFTs have risen up in the last few days, asymptomatic, ultrasound stable.  Likely due to daptomycin.  ID monitoring.   Nutrition Status: Nutrition Problem: Inadequate oral intake Etiology: vomiting, nausea Signs/Symptoms: per patient/family report Interventions: Ensure Enlive (each supplement provides 350kcal and 20 grams of protein), MVI, Snacks  BMI: Estimated body mass index is 24.83 kg/m as calculated from the following:   Height as of this encounter: 5\' 10"  (1.778 m).   Weight as of this encounter: 78.5 kg.   Code status:   Code Status: Full Code   DVT Prophylaxis: enoxaparin (LOVENOX) injection 40 mg Start: 02/25/22 1700   Family Communication: None at bedside   Disposition Plan: Status is: Inpatient Remains inpatient appropriate because: Remain inpatient until 9/6 till patient completes course of IV antibiotics.   Planned Discharge Destination:Home after patient completes a course of IV antibiotics-stop date 9/6.   Diet: Diet Order  Diet regular Room service appropriate? Yes; Fluid consistency: Thin  Diet effective now                    MEDICATIONS: Scheduled  Meds:  Chlorhexidine Gluconate Cloth  6 each Topical Daily   enoxaparin (LOVENOX) injection  40 mg Subcutaneous Q24H   escitalopram  10 mg Oral Daily   feeding supplement  237 mL Oral BID BM   methadone  135 mg Oral Daily   multivitamin with minerals  1 tablet Oral Daily   nicotine  14 mg Transdermal Daily   traZODone  50 mg Oral QHS   Continuous Infusions:  DAPTOmycin (CUBICIN) 750 mg in sodium chloride 0.9 % IVPB 750 mg (04/07/22 2038)   sodium chloride     PRN Meds:.acetaminophen, ALPRAZolam, alum & mag hydroxide-simeth, bisacodyl, diclofenac Sodium, docusate sodium, hydrOXYzine, loperamide, methocarbamol, polyethylene glycol, promethazine, sodium chloride, sodium chloride flush   I have personally reviewed following labs and imaging studies  LABORATORY DATA: Recent Labs  Lab 04/02/22 0428 04/04/22 0402 04/08/22 0430  WBC 6.3 5.7 6.7  HGB 9.4* 8.9* 9.0*  HCT 29.6* 28.8* 28.1*  PLT 215 168 109*  MCV 86.3 88.1 87.3  MCH 27.4 27.2 28.0  MCHC 31.8 30.9 32.0  RDW 17.7* 17.9* 17.9*  LYMPHSABS  --  1.7 1.3  MONOABS  --  0.4 0.6  EOSABS  --  0.3 0.3  BASOSABS  --  0.0 0.0    Recent Labs  Lab 04/01/22 0931 04/02/22 0428 04/03/22 0522 04/04/22 0402 04/05/22 0444 04/06/22 0449 04/08/22 0430  NA  --    < > 138 138 137 138 135  K  --    < > 4.1 3.8 4.1 4.2 4.2  CL  --    < > 97* 102 101 103 101  CO2  --    < > 29 30 30 31 29   GLUCOSE  --    < > 90 104* 98 97 101*  BUN  --    < > 23* 25* 22* 17 21*  CREATININE  --    < > 0.84 0.76 0.80 0.81 0.66  CALCIUM  --    < > 9.6 9.2 9.0 9.2 9.1  AST  --    < > 77* 70* 72* 76* 54*  ALT  --    < > 121* 117* 119* 128* 108*  ALKPHOS  --    < > 74 92 82 86 75  BILITOT  --    < > 0.3 0.3 0.3 0.4 0.6  ALBUMIN  --    < > 3.1* 3.0* 3.0* 3.1* 3.0*  MG  --   --   --  1.9  --   --  1.8  INR 1.2  --   --   --   --  1.1  --    < > = values in this interval not displayed.    MICROBIOLOGY: No results found for this or any previous visit  (from the past 240 hour(s)).  RADIOLOGY STUDIES/RESULTS: No results found.   LOS: 42 days   Signature  M.D on 04/08/2022 at 8:59 AM   -  To page go to [email or web address]

## 2022-04-08 NOTE — Progress Notes (Signed)
   04/08/22 1822  Assess: MEWS Score  Temp (!) 101.2 F (38.4 C)  BP 124/64  MAP (mmHg) 81  Pulse Rate (!) 117  Resp 20  SpO2 98 %  O2 Device Room Air  Assess: MEWS Score  MEWS Temp 1  MEWS Systolic 0  MEWS Pulse 2  MEWS RR 0  MEWS LOC 0  MEWS Score 3  MEWS Score Color Yellow  Assess: if the MEWS score is Yellow or Red  Were vital signs taken at a resting state? Yes  Focused Assessment Change from prior assessment (see assessment flowsheet)  Does the patient meet 2 or more of the SIRS criteria? Yes  Does the patient have a confirmed or suspected source of infection? Yes  Provider and Rapid Response Notified? Yes (MD Notified)  MEWS guidelines implemented *See Row Information* No, vital signs rechecked  Take Vital Signs  Increase Vital Sign Frequency  Yellow: Q 2hr X 2 then Q 4hr X 2, if remains yellow, continue Q 4hrs  Escalate  MEWS: Escalate Yellow: discuss with charge nurse/RN and consider discussing with provider and RRT  Notify: Charge Nurse/RN  Name of Charge Nurse/RN Notified Erin  Date Charge Nurse/RN Notified 04/08/22  Time Charge Nurse/RN Notified 1845  Notify: Provider  Provider Name/Title MD P. Thedore Mins  Date Provider Notified 04/08/22  Time Provider Notified 1820  Method of Notification Page  Notification Reason Change in status  Provider response See new orders  Date of Provider Response 04/08/22  Time of Provider Response 1824  Document  Patient Outcome Other (Comment) (new orders in place, outcome pending)  Progress note created (see row info) Yes  Assess: SIRS CRITERIA  SIRS Temperature  1  SIRS Pulse 1  SIRS Respirations  0  SIRS WBC 1  SIRS Score Sum  3

## 2022-04-08 NOTE — Progress Notes (Signed)
  Echocardiogram 2D Echocardiogram has been performed.  Delcie Roch 04/08/2022, 4:10 PM

## 2022-04-09 ENCOUNTER — Inpatient Hospital Stay (HOSPITAL_COMMUNITY): Payer: BLUE CROSS/BLUE SHIELD

## 2022-04-09 DIAGNOSIS — R7989 Other specified abnormal findings of blood chemistry: Secondary | ICD-10-CM | POA: Diagnosis not present

## 2022-04-09 DIAGNOSIS — G894 Chronic pain syndrome: Secondary | ICD-10-CM | POA: Diagnosis not present

## 2022-04-09 DIAGNOSIS — R7881 Bacteremia: Secondary | ICD-10-CM | POA: Diagnosis not present

## 2022-04-09 DIAGNOSIS — F1990 Other psychoactive substance use, unspecified, uncomplicated: Secondary | ICD-10-CM | POA: Diagnosis not present

## 2022-04-09 DIAGNOSIS — B182 Chronic viral hepatitis C: Secondary | ICD-10-CM | POA: Diagnosis not present

## 2022-04-09 LAB — BLOOD CULTURE ID PANEL (REFLEXED) - BCID2

## 2022-04-09 LAB — CBC WITH DIFFERENTIAL/PLATELET
Abs Immature Granulocytes: 0.02 10*3/uL (ref 0.00–0.07)
Basophils Absolute: 0 10*3/uL (ref 0.0–0.1)
Basophils Relative: 0 %
Eosinophils Absolute: 0.1 10*3/uL (ref 0.0–0.5)
Eosinophils Relative: 1 %
HCT: 30.9 % — ABNORMAL LOW (ref 39.0–52.0)
Hemoglobin: 9.6 g/dL — ABNORMAL LOW (ref 13.0–17.0)
Immature Granulocytes: 0 %
Lymphocytes Relative: 18 %
Lymphs Abs: 1.5 10*3/uL (ref 0.7–4.0)
MCH: 27.3 pg (ref 26.0–34.0)
MCHC: 31.1 g/dL (ref 30.0–36.0)
MCV: 87.8 fL (ref 80.0–100.0)
Monocytes Absolute: 0.8 10*3/uL (ref 0.1–1.0)
Monocytes Relative: 10 %
Neutro Abs: 5.8 10*3/uL (ref 1.7–7.7)
Neutrophils Relative %: 71 %
Platelets: 98 10*3/uL — ABNORMAL LOW (ref 150–400)
RBC: 3.52 MIL/uL — ABNORMAL LOW (ref 4.22–5.81)
RDW: 17.8 % — ABNORMAL HIGH (ref 11.5–15.5)
WBC: 8.2 10*3/uL (ref 4.0–10.5)
nRBC: 0 % (ref 0.0–0.2)

## 2022-04-09 LAB — MAGNESIUM: Magnesium: 2 mg/dL (ref 1.7–2.4)

## 2022-04-09 LAB — COMPREHENSIVE METABOLIC PANEL
ALT: 95 U/L — ABNORMAL HIGH (ref 0–44)
AST: 44 U/L — ABNORMAL HIGH (ref 15–41)
Albumin: 3 g/dL — ABNORMAL LOW (ref 3.5–5.0)
Alkaline Phosphatase: 72 U/L (ref 38–126)
Anion gap: 9 (ref 5–15)
BUN: 24 mg/dL — ABNORMAL HIGH (ref 6–20)
CO2: 26 mmol/L (ref 22–32)
Calcium: 9.5 mg/dL (ref 8.9–10.3)
Chloride: 103 mmol/L (ref 98–111)
Creatinine, Ser: 0.72 mg/dL (ref 0.61–1.24)
GFR, Estimated: >60 mL/min (ref >60)
Glucose, Bld: 117 mg/dL — ABNORMAL HIGH (ref 70–99)
Potassium: 4.8 mmol/L (ref 3.5–5.1)
Sodium: 138 mmol/L (ref 135–145)
Total Bilirubin: 0.3 mg/dL (ref 0.3–1.2)
Total Protein: 7.2 g/dL (ref 6.5–8.1)

## 2022-04-09 LAB — PROCALCITONIN: Procalcitonin: 0.19 ng/mL

## 2022-04-09 MED ORDER — LACTATED RINGERS IV SOLN: INTRAVENOUS | Status: AC

## 2022-04-09 MED ORDER — DIPHENHYDRAMINE HCL 50 MG/ML IJ SOLN
25.0000 mg | Freq: Once | INTRAMUSCULAR | Status: AC
Start: 2022-04-09 — End: 2022-04-09
  Administered 2022-04-09: 25 mg via INTRAVENOUS
  Filled 2022-04-09: qty 1

## 2022-04-09 MED ORDER — SODIUM CHLORIDE 0.9 % IV SOLN
10.0000 mg/kg | Freq: Every day | INTRAVENOUS | Status: DC
  Administered 2022-04-09: 750 mg via INTRAVENOUS
  Filled 2022-04-09: qty 15

## 2022-04-09 NOTE — Plan of Care (Signed)
  Problem: Clinical Measurements: Goal: Diagnostic test results will improve Outcome: Progressing   Problem: Activity: Goal: Risk for activity intolerance will decrease Outcome: Progressing   Problem: Nutrition: Goal: Adequate nutrition will be maintained Outcome: Progressing   

## 2022-04-09 NOTE — Progress Notes (Signed)
Subjective: Patient is nervous about his heart valve   Antibiotics:  Anti-infectives (From admission, onward)    Start     Dose/Rate Route Frequency Ordered Stop   04/09/22 2000  DAPTOmycin (CUBICIN) 750 mg in sodium chloride 0.9 % IVPB        10 mg/kg  74 kg 130 mL/hr over 30 Minutes Intravenous Daily 04/09/22 1340 05/01/22 1959   03/12/22 1500  doxycycline (VIBRA-TABS) tablet 100 mg        100 mg Oral 2 times daily 03/12/22 1412 03/19/22 0852   03/09/22 2000  DAPTOmycin (CUBICIN) 750 mg in sodium chloride 0.9 % IVPB  Status:  Discontinued        10 mg/kg  74 kg 130 mL/hr over 30 Minutes Intravenous Daily 03/09/22 1036 04/09/22 1340   02/28/22 1245  DAPTOmycin (CUBICIN) 600 mg in sodium chloride 0.9 % IVPB  Status:  Discontinued        8 mg/kg  74 kg 124 mL/hr over 30 Minutes Intravenous Daily 02/28/22 1145 03/09/22 1036   02/25/22 1745  ceFAZolin (ANCEF) IVPB 2g/100 mL premix  Status:  Discontinued        2 g 200 mL/hr over 30 Minutes Intravenous Every 8 hours 02/25/22 1734 03/09/22 1036       Medications: Scheduled Meds:  Chlorhexidine Gluconate Cloth  6 each Topical Daily   enoxaparin (LOVENOX) injection  40 mg Subcutaneous Q24H   escitalopram  10 mg Oral Daily   feeding supplement  237 mL Oral BID BM   methadone  135 mg Oral Daily   multivitamin with minerals  1 tablet Oral Daily   nicotine  14 mg Transdermal Daily   traZODone  50 mg Oral QHS   Continuous Infusions:  DAPTOmycin (CUBICIN) 750 mg in sodium chloride 0.9 % IVPB     sodium chloride     PRN Meds:.acetaminophen, ALPRAZolam, alum & mag hydroxide-simeth, bisacodyl, diclofenac Sodium, docusate sodium, hydrOXYzine, ibuprofen, loperamide, methocarbamol, polyethylene glycol, promethazine, sodium chloride, sodium chloride flush    Objective: Weight change:   Intake/Output Summary (Last 24 hours) at 04/09/2022 1558 Last data filed at 04/09/2022 2355 Gross per 24 hour  Intake 1628.42 ml  Output  --  Net 1628.42 ml   Blood pressure 107/70, pulse 91, temperature 98.5 F (36.9 C), temperature source Oral, resp. rate 16, height 5\' 10"  (1.778 m), weight 63.6 kg, SpO2 99 %. Temp:  [97.6 F (36.4 C)-101.2 F (38.4 C)] 98.5 F (36.9 C) (09/05 1242) Pulse Rate:  [78-117] 91 (09/05 1242) Resp:  [16-20] 16 (09/05 1242) BP: (98-124)/(57-70) 107/70 (09/05 1242) SpO2:  [92 %-100 %] 99 % (09/05 1242) Weight:  [63.6 kg] 63.6 kg (09/05 0500)  Physical Exam: Physical Exam Constitutional:      Appearance: He is well-developed.  HENT:     Head: Normocephalic and atraumatic.  Eyes:     Conjunctiva/sclera: Conjunctivae normal.  Cardiovascular:     Rate and Rhythm: Regular rhythm. Tachycardia present.     Heart sounds: Murmur heard.  Pulmonary:     Effort: Pulmonary effort is normal. No respiratory distress.     Breath sounds: Normal breath sounds. No stridor. No wheezing.  Abdominal:     General: There is no distension.     Palpations: Abdomen is soft.  Musculoskeletal:        General: Normal range of motion.     Cervical back: Normal range of motion and neck supple.  Skin:  General: Skin is warm and dry.     Findings: No erythema or rash.  Neurological:     General: No focal deficit present.     Mental Status: He is alert and oriented to person, place, and time.  Psychiatric:        Attention and Perception: Attention normal.        Mood and Affect: Mood is anxious.        Behavior: Behavior normal.        Thought Content: Thought content normal.        Judgment: Judgment normal.      CBC:    BMET Recent Labs    04/08/22 1848 04/09/22 0343  NA 135 138  K 4.1 4.8  CL 100 103  CO2 26 26  GLUCOSE 93 117*  BUN 25* 24*  CREATININE 0.68 0.72  CALCIUM 9.1 9.5     Liver Panel  Recent Labs    04/08/22 1848 04/09/22 0343  PROT 7.0 7.2  ALBUMIN 3.0* 3.0*  AST 55* 44*  ALT 103* 95*  ALKPHOS 86 72  BILITOT 0.3 0.3       Sedimentation Rate No results  for input(s): "ESRSEDRATE" in the last 72 hours. C-Reactive Protein No results for input(s): "CRP" in the last 72 hours.  Micro Results: Recent Results (from the past 720 hour(s))  Culture, blood (Routine X 2) w Reflex to ID Panel     Status: None (Preliminary result)   Collection Time: 04/08/22  7:12 PM   Specimen: BLOOD  Result Value Ref Range Status   Specimen Description BLOOD BLOOD LEFT FOREARM  Final   Special Requests   Final    BOTTLES DRAWN AEROBIC AND ANAEROBIC Blood Culture results may not be optimal due to an inadequate volume of blood received in culture bottles ARPEDB USED AS AEROBIC BOTTLE   Culture   Final    NO GROWTH < 12 HOURS Performed at [facility], [address] [place], [place] [address]    Report Status PENDING  Incomplete  Culture, blood (Routine X 2) w Reflex to ID Panel     Status: None (Preliminary result)   Collection Time: 04/08/22  7:12 PM   Specimen: BLOOD  Result Value Ref Range Status   Specimen Description BLOOD BLOOD LEFT FOREARM  Final   Special Requests   Final    BOTTLES DRAWN AEROBIC AND ANAEROBIC Blood Culture results may not be optimal due to an inadequate volume of blood received in culture bottles ARPEDB USED AS AEROBIC   Culture  Setup Time   Final    GRAM POSITIVE COCCI IN CLUSTERS IN PEDIATRIC BOTTLE Organism ID to follow Performed at [facility], [address], [place], [place] [address]    Culture GRAM POSITIVE COCCI  Final   Report Status PENDING  Incomplete  SARS Coronavirus 2 by RT PCR (hospital order, performed in [facility] hospital lab) *cepheid single result test* Anterior Nasal Swab     Status: None   Collection Time: 04/08/22  8:52 PM   Specimen: Anterior Nasal Swab  Result Value Ref Range Status   SARS Coronavirus 2 by RT PCR NEGATIVE NEGATIVE Final    Comment: (NOTE) SARS-CoV-2 target nucleic acids are NOT DETECTED.  The SARS-CoV-2 RNA is generally detectable in upper and lower respiratory  specimens during the acute phase of infection. The lowest concentration of SARS-CoV-2 viral copies this assay can detect is 250 copies / mL. A negative result  does not preclude SARS-CoV-2 infection and should not be used as the sole basis for treatment or other patient management decisions.  A negative result may occur with improper specimen collection / handling, submission of specimen other than nasopharyngeal swab, presence of viral mutation(s) within the areas targeted by this assay, and inadequate number of viral copies (<250 copies / mL). A negative result must be combined with clinical observations, patient history, and epidemiological information.  Fact Sheet for Patients:   RoadLapTop.co.zahttps://www.fda.gov/media/158405/download  Fact Sheet for Healthcare Providers: http://kim-miller.com/https://www.fda.gov/media/158404/download  This test is not yet approved or  cleared by the [place] FDA and has been authorized for detection and/or diagnosis of SARS-CoV-2 by FDA under an Emergency Use Authorization (EUA).  This EUA will remain in effect (meaning this test can be used) for the duration of the COVID-19 declaration under Section 564(b)(1) of the Act, 21 U.S.C. section 360bbb-3(b)(1), unless the authorization is terminated or revoked sooner.  Performed at [facility], [address] [place], [place] [address]     Studies/Results: DG Chest Port 1 View  Result Date: 04/09/2022 CLINICAL DATA:  [id number]. Fever this morning with a prior history of sepsis. EXAM: PORTABLE CHEST 1 VIEW COMPARISON:  PA Lat chest 03/11/2022, chest CT 02/26/2022. FINDINGS: The cardiomediastinal silhouette and vascular pattern are normal. There are coarse linear scar-like opacities in the right lower lung field with previous study demonstrating patchy infiltrates in both lower lung fields. The lungs are otherwise generally clear. No pleural effusion is seen. There is a right PICC terminating at about the superior cavoatrial  junction. IMPRESSION: Coarse linear scar-like markings in right lower lung field with previous study demonstrating patchy infiltrates in both lower zones and small pleural effusions. Currently no acute lung infiltrate is visualized. Electronically Signed   By: Almira BarKeith  Chesser M.D.   On: 04/09/2022 06:34   ECHOCARDIOGRAM LIMITED  Result Date: 04/08/2022    ECHOCARDIOGRAM LIMITED REPORT   Patient Name:   [place] Clarke Date of Exam: 04/08/2022 Medical Rec #:  [id number]    Height:       70.0 in Accession #:    [id number]   Weight:       173.1 lb Date of Birth:  Apr 08, 1991    BSA:          1.963 m Patient Age:    31 years     BP:           103/70 mmHg Patient Gender: M            HR:           97 bpm. Exam Location:  Inpatient Procedure: Limited Echo, Limited Color Doppler and Cardiac Doppler Indications:    endocarditis  History:        Patient has prior history of Echocardiogram examinations, most                 recent 03/07/2022.  Sonographer:    Delcie RochLauren Pennington RDCS Referring Phys: Effie [id number] [facility] ScotlandPRASHANT K [facility] IMPRESSIONS  1. Limited study for endocarditis  2. Left ventricular ejection fraction, by estimation, is 60 to 65%. The left ventricle has normal function. The left ventricle has no regional wall motion abnormalities.  3. 1 x 2.4 cm mobile mass attached to the septal leaflet of the valve, consistent with vegetation. An additional mass noted on the anterior leaflet measuring 1 x 1.65 cm which is also consistent with vegetation - the posterior leaflet is not well-visualized. Tricuspid valve regurgitation is moderate. 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  4. The aortic valve is tricuspid. Aortic valve regurgitation is not visualized.  5. There is moderately elevated pulmonary artery systolic pressure.  6. The inferior vena cava is dilated in size with <50% respiratory variability, suggesting right atrial pressure of 15 mmHg. Conclusion(s)/Recommendation(s): Critical findings reported to Dr. Thedore Mins and acknowledged at 4:23  pm. FINDINGS  Left Ventricle: Left ventricular ejection fraction, by estimation, is 60 to 65%. The left ventricle has normal function. The left ventricle has no regional wall motion abnormalities. Right Ventricle: There is moderately elevated pulmonary artery systolic pressure. The tricuspid regurgitant velocity is 2.78 m/s, and with an assumed right atrial pressure of 15 mmHg, the estimated right ventricular systolic pressure is 45.9 mmHg. Tricuspid Valve: 1 x 2.4 cm mobile mass attached to the septal leaflet of the valve, consistent with vegetation. An additional mass noted on the anterior leaflet measuring 1 x 1.65 cm which is also consistent with vegetation - the posterior leaflet is not well-visualized. Tricuspid valve regurgitation is moderate . 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 Aortic Valve: The aortic valve is tricuspid. Aortic valve regurgitation is not visualized. Pulmonic Valve: The pulmonic valve was not well visualized. Pulmonic valve regurgitation is trivial. Venous: The inferior vena cava is dilated in size with less than 50% respiratory variability, suggesting right atrial pressure of 15 mmHg.  Diastology LV e' medial:  11.90 cm/s LV e' lateral: 14.70 cm/s  IVC IVC diam: 2.20 cm AORTIC VALVE LVOT Vmax:   150.00 cm/s LVOT Vmean:  100.000 cm/s LVOT VTI:    0.230 m TRICUSPID VALVE TR Peak grad:   30.9 mmHg TR Vmax:        278.00 cm/s  SHUNTS Systemic VTI: 0.23 m Zoila Shutter MD Electronically signed by Zoila Shutter MD Signature Date/Time: 04/08/2022/4:39:53 PM    Final       Assessment/Plan:  INTERVAL HISTORY: TTE with increase in size of one of his TV vegetations   Principal Problem:   Sepsis ([name]) Active Problems:   MRSA bacteremia   Chronic pain syndrome/methadone abuse    Tobacco abuse   Polysubstance abuse ([name])   Hepatitis C, chronic ([name])   Anxiety   Normocytic anemia   Prolonged QT interval   Elevated serum creatinine   Hypokalemia   Hypomagnesemia   Hyponatremia    Subacute bacterial endocarditis   MSSA bacteremia   IVDU (intravenous drug user)   Pneumonia of both lower lobes due to methicillin resistant Staphylococcus aureus (MRSA) ([name])    Reginald Clarke is a 31 y.o. male with history of IV drug use MSSA bacteremia endocarditis status post angio back in April 2023 with residual subvalvular vegetation not amenable to repeat angio VAC.  History of cefazolin followed by dalbavancin.  He had persistent MSSA bacteremia in May 2023 with thoracic spine discitis sp 6 weeks of cefazolin followed by cefdinir then MRSA bacteremia in context of PICC line with  TV endocarditis, currently on daptomycin to cover for possibility of MSSA and MRSA tricuspid valve endocarditis.  He was on course to complete therapy but became acutely febrile and diaphoretic.  Blood cultures were taken and a repeat 2D echocardiogram was performed This is shown 1.2.4's mobile mass on the septal leaflet of the valve consistent with previous to vegetation as well as a mass in the anterior leaflet that is 1 x 1.65 cm.  The possibility was not well visualized his tricuspid valve regurgitation is now moderate with mild to moderate tricuspid stenosis  He is being  re-evaluated by CT surgery  One of the 2 blood cultures is now growing [facility]  Would continue daptomycin.  We will follow-up ID of the organism growing in one of his blood cultures.  If he is growing MSSA or MRSA in context of daptomycin therapy he will clearly need to have his PICC line removed I worry more that his source control wise with his bowel and that he will need valve replacement for control of this infection.  Chronic HCV: will recheck HCV RNA  I spent 52 minutes with the patient including than 50% of the time in face to face counseling of the patient.  Endocarditis his chronic hepatitis C without hepatic coma personally reviewing 2D echocardiogram along with review of medical records in preparation for the visit and during  the visit and in coordination of his care.    LOS: 43 days   Acey Lav 04/09/2022, 3:58 PM

## 2022-04-09 NOTE — Progress Notes (Addendum)
PHARMACY - PHYSICIAN COMMUNICATION CRITICAL VALUE ALERT - BLOOD CULTURE IDENTIFICATION (BCID)  Reginald Clarke is an 31 y.o. male who presented to [facility] on 02/25/2022 with sepsis due to recurrent MSSA Bacteremia with native tricuspid valve endocarditis.   Assessment: 1/4 blood cultures growing staph aureus with no resistance genes detected.   Name of physician (or Provider) Contacted: Dr. Daiva Eves  Current antibiotics: Daptomycin 750mg  IV daily   Changes to prescribed antibiotics recommended: None  Results for orders placed or performed during the hospital encounter of 02/24/22  Blood Culture ID Panel (Reflexed) (Collected: 02/24/2022  6:41 PM)  Result Value Ref Range   Enterococcus faecalis NOT DETECTED NOT DETECTED   Enterococcus Faecium NOT DETECTED NOT DETECTED   Listeria monocytogenes NOT DETECTED NOT DETECTED   Staphylococcus species NOT DETECTED NOT DETECTED   Staphylococcus aureus (BCID) NOT DETECTED NOT DETECTED   Staphylococcus epidermidis NOT DETECTED NOT DETECTED   Staphylococcus lugdunensis NOT DETECTED NOT DETECTED   Streptococcus species NOT DETECTED NOT DETECTED   Streptococcus agalactiae NOT DETECTED NOT DETECTED   Streptococcus pneumoniae NOT DETECTED NOT DETECTED   Streptococcus pyogenes NOT DETECTED NOT DETECTED   A.calcoaceticus-baumannii NOT DETECTED NOT DETECTED   Bacteroides fragilis NOT DETECTED NOT DETECTED   Enterobacterales NOT DETECTED NOT DETECTED   Enterobacter cloacae complex NOT DETECTED NOT DETECTED   Escherichia coli NOT DETECTED NOT DETECTED   Klebsiella aerogenes NOT DETECTED NOT DETECTED   Klebsiella oxytoca NOT DETECTED NOT DETECTED   Klebsiella pneumoniae NOT DETECTED NOT DETECTED   Proteus species NOT DETECTED NOT DETECTED   Salmonella species NOT DETECTED NOT DETECTED   Serratia marcescens NOT DETECTED NOT DETECTED   Haemophilus influenzae NOT DETECTED NOT DETECTED   Neisseria meningitidis NOT DETECTED NOT DETECTED   Pseudomonas  aeruginosa NOT DETECTED NOT DETECTED   Stenotrophomonas maltophilia NOT DETECTED NOT DETECTED   Candida albicans NOT DETECTED NOT DETECTED   Candida auris NOT DETECTED NOT DETECTED   Candida glabrata NOT DETECTED NOT DETECTED   Candida krusei NOT DETECTED NOT DETECTED   Candida parapsilosis NOT DETECTED NOT DETECTED   Candida tropicalis NOT DETECTED NOT DETECTED   Cryptococcus neoformans/gattii NOT DETECTED NOT DETECTED     Addendum: BCID call for 2/4 blood cultures now growing staph aureus with no resistance detected. MD 02/26/2022 made aware.    Daiva Eves, PharmD Clinical Pharmacist 04/09/2022 4:31 PM

## 2022-04-09 NOTE — Progress Notes (Signed)
Instructed this patient on procedure. HOB less than 45*. Pt held breath upon line removal, pressure held for 5 min post line removal. Instructed patient to remain in bed for 30 min, monitoring for bleeding and apply pressure and notify nurse. Instructed to drsg CDI for 24 hours. Patient VU. Notified nurse. Tomasita Morrow, RN VAST

## 2022-04-09 NOTE — Progress Notes (Signed)
PROGRESS NOTE        PATIENT DETAILS Name: Reginald Clarke Age: 31 y.o. Sex: male Date of Birth: 1991-05-25 Admit Date: 02/25/2022 Admitting Physician Dorcas Carrow, MD [id number], [name], PA-C  Brief Summary: Patient is a 31 y.o.  male with history of recurrent MSSA native tricuspid valve endocarditis  Significant events: 4/12-5/10>> hospitalization for MSSA bacteremia with native tricuspid valve endocarditis.  Underwent angio vac procedure on 4/26.Completed cefazolin on 5/10-planned weekly dalbavancin at the ID clinic.   5/22>> outpatient blood culture positive for MSSA 5/24-6/8>> admit to [facility] for persistent bacteremia-worsening back pain-discharge to [facility] on IV Ancef. 7/5>> discharged from [facility] on oral cefdinir.  Continued to have intermittent fever postdischarge.   7/24>> admit for-persistent fever/nausea/vomiting/diarrhea-blood cultures positive for MSSA.  Significant studies: 7/25>> CT chest: Multifocal airspace opacities-increasing cavitary nodules 7/25>> MRI thoracic spine: No evidence of infection in thoracic spine.  Significant microbiology data: 7/24>> urine culture: Negative 7/25>> blood culture: 1/2-MSSA. 7/27>> blood culture: No growth 7/29>> blood culture: No growth  Procedures:  8/3>> TEE: 3 vegetations on tricuspid valve-severe TR.  EF 60-65%  Consults: ID  Subjective: Patient in bed denies any headache chest or abdominal pain, had fevers yesterday twice.   Objective: Vitals: Blood pressure (!) 98/57, pulse 78, temperature 97.6 F (36.4 C), temperature source Oral, resp. rate 18, height 5\' 10"  (1.778 m), weight 63.6 kg, SpO2 100 %.   Exam:  Awake Alert, No new F.N deficits, right arm PICC line. [place].AT,PERRAL Supple Neck, No JVD,   Symmetrical Chest wall movement, Good air movement bilaterally, CTAB RRR,No Gallops, Rubs or new Murmurs,  +ve B.Sounds, Abd Soft, No tenderness,   No Cyanosis, Clubbing or edema       Assessment/Plan:  Sepsis due to recurrent MSSA bacteremia with native tricuspid valve endocarditis: Afebrile-nontoxic-appearing-plan is to continue IV antibiotics until 04/10/22, has a right lower arm PICC line.  Unfortunately patient had high fevers on 04/08/2022, repeat echocardiogram which was transthoracic obtained Case discussed with cardiologist Dr. 06/08/2022 he still has multiple vegetations on his echocardiogram with at least one vegetation slightly larger in size.  Patient will be kept in the hospital longer, repeat cultures were ordered and obtained on 04/08/2022, there is a small chance he might have PICC line infection, COVID test was negative, ID has been reconsulted they will see the patient shortly, I have also consulted cardiothoracic surgery who will see the patient today.  For now continue supportive care with IV antibiotics and monitor.    Normocytic anemia: Due to chronic disease/recurrent endocarditis: No indication for transfusion.  Anxiety: Stable-on Lexapro-Xanax be slowly weaned down.  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  Mild transaminitis.  Asymptomatic.  Does have chronic hepatitis C but LFTs have risen up in the last few days, asymptomatic, ultrasound stable.  Likely due to daptomycin.  ID monitoring.   Nutrition Status: Nutrition Problem: Inadequate oral intake Etiology: vomiting, nausea Signs/Symptoms: per patient/family report Interventions: Ensure Enlive (each supplement provides 350kcal and 20 grams of protein), MVI, Snacks  BMI: Estimated body mass index is 20.12 kg/m as calculated from the following:   Height as of this encounter: 5\' 10"  (1.778 m).   Weight as of this encounter: 63.6 kg.   Code status:   Code Status: Full Code   DVT Prophylaxis: enoxaparin (LOVENOX) injection  40 mg Start: 02/25/22 1700   Family Communication: None at bedside   Disposition Plan: Status is:  Inpatient Remains inpatient appropriate because: Remain inpatient until 9/6 till patient completes course of IV antibiotics.   Planned Discharge Destination:Home after patient completes a course of IV antibiotics-stop date 9/6.   Diet: Diet Order             Diet regular Room service appropriate? Yes; Fluid consistency: Thin  Diet effective now                    MEDICATIONS: Scheduled Meds:  Chlorhexidine Gluconate Cloth  6 each Topical Daily   enoxaparin (LOVENOX) injection  40 mg Subcutaneous Q24H   escitalopram  10 mg Oral Daily   feeding supplement  237 mL Oral BID BM   methadone  135 mg Oral Daily   multivitamin with minerals  1 tablet Oral Daily   nicotine  14 mg Transdermal Daily   traZODone  50 mg Oral QHS   Continuous Infusions:  DAPTOmycin (CUBICIN) 750 mg in sodium chloride 0.9 % IVPB 130 mL/hr at 04/09/22 0611   sodium chloride     PRN Meds:.acetaminophen, ALPRAZolam, alum & mag hydroxide-simeth, bisacodyl, diclofenac Sodium, docusate sodium, hydrOXYzine, ibuprofen, loperamide, methocarbamol, polyethylene glycol, promethazine, sodium chloride, sodium chloride flush   I have personally reviewed following labs and imaging studies  LABORATORY DATA: Recent Labs  Lab 04/04/22 0402 04/08/22 0430 04/09/22 0343  WBC 5.7 6.7 8.2  HGB 8.9* 9.0* 9.6*  HCT 28.8* 28.1* 30.9*  PLT 168 109* 98*  MCV 88.1 87.3 87.8  MCH 27.2 28.0 27.3  MCHC 30.9 32.0 31.1  RDW 17.9* 17.9* 17.8*  LYMPHSABS 1.7 1.3 1.5  MONOABS 0.4 0.6 0.8  EOSABS 0.3 0.3 0.1  BASOSABS 0.0 0.0 0.0    Recent Labs  Lab 04/04/22 0402 04/05/22 0444 04/06/22 0449 04/08/22 0430 04/08/22 1848 04/09/22 0343  NA 138 137 138 135 135 138  K 3.8 4.1 4.2 4.2 4.1 4.8  CL 102 101 103 101 100 103  CO2 30 30 31 29 26 26   GLUCOSE 104* 98 97 101* 93 117*  BUN 25* 22* 17 21* 25* 24*  CREATININE 0.76 0.80 0.81 0.66 0.68 0.72  CALCIUM 9.2 9.0 9.2 9.1 9.1 9.5  AST 70* 72* 76* 54* 55* 44*  ALT 117* 119*  128* 108* 103* 95*  ALKPHOS 92 82 86 75 86 72  BILITOT 0.3 0.3 0.4 0.6 0.3 0.3  ALBUMIN 3.0* 3.0* 3.1* 3.0* 3.0* 3.0*  MG 1.9  --   --  1.8  --  2.0  PROCALCITON  --   --   --   --  <0.10 0.19  INR  --   --  1.1  --   --   --     MICROBIOLOGY: Recent Results (from the past 240 hour(s))  Culture, blood (Routine X 2) w Reflex to ID Panel     Status: None (Preliminary result)   Collection Time: 04/08/22  7:12 PM   Specimen: BLOOD  Result Value Ref Range Status   Specimen Description BLOOD BLOOD LEFT FOREARM  Final   Special Requests   Final    BOTTLES DRAWN AEROBIC AND ANAEROBIC Blood Culture results may not be optimal due to an inadequate volume of blood received in culture bottles ARPEDB USED AS AEROBIC BOTTLE   Culture   Final    NO GROWTH < 12 HOURS Performed at [facility], [address], [place], [place] [address]    Report Status PENDING  Incomplete  Culture, blood (Routine X 2) w Reflex to ID Panel     Status: None (Preliminary result)   Collection Time: 04/08/22  7:12 PM   Specimen: BLOOD  Result Value Ref Range Status   Specimen Description BLOOD BLOOD LEFT FOREARM  Final   Special Requests   Final    BOTTLES DRAWN AEROBIC AND ANAEROBIC Blood Culture results may not be optimal due to an inadequate volume of blood received in culture bottles ARPEDB USED AS AEROBIC   Culture   Final    NO GROWTH < 12 HOURS Performed at [facility], [address] [place], [place] [address]    Report Status PENDING  Incomplete  SARS Coronavirus 2 by RT PCR (hospital order, performed in [facility] hospital lab) *cepheid single result test* Anterior Nasal Swab     Status: None   Collection Time: 04/08/22  8:52 PM   Specimen: Anterior Nasal Swab  Result Value Ref Range Status   SARS Coronavirus 2 by RT PCR NEGATIVE NEGATIVE Final    Comment: (NOTE) SARS-CoV-2 target nucleic acids are NOT DETECTED.  The SARS-CoV-2 RNA is generally detectable in upper and lower respiratory  specimens during the acute phase of infection. The lowest concentration of SARS-CoV-2 viral copies this assay can detect is 250 copies / mL. A negative result does not preclude SARS-CoV-2 infection and should not be used as the sole basis for treatment or other patient management decisions.  A negative result may occur with improper specimen collection / handling, submission of specimen other than nasopharyngeal swab, presence of viral mutation(s) within the areas targeted by this assay, and inadequate number of viral copies (<250 copies / mL). A negative result must be combined with clinical observations, patient history, and epidemiological information.  Fact Sheet for Patients:   RoadLapTop.co.za  Fact Sheet for Healthcare Providers: http://kim-miller.com/  This test is not yet approved or  cleared by the [place] FDA and has been authorized for detection and/or diagnosis of SARS-CoV-2 by FDA under an Emergency Use Authorization (EUA).  This EUA will remain in effect (meaning this test can be used) for the duration of the COVID-19 declaration under Section 564(b)(1) of the Act, 21 U.S.C. section 360bbb-3(b)(1), unless the authorization is terminated or revoked sooner.  Performed at [facility], [address] [place], [place] [address]     RADIOLOGY STUDIES/RESULTS: DG Chest Port 1 View  Result Date: 04/09/2022 CLINICAL DATA:  [id number]. Fever this morning with a prior history of sepsis. EXAM: PORTABLE CHEST 1 VIEW COMPARISON:  PA Lat chest 03/11/2022, chest CT 02/26/2022. FINDINGS: The cardiomediastinal silhouette and vascular pattern are normal. There are coarse linear scar-like opacities in the right lower lung field with previous study demonstrating patchy infiltrates in both lower lung fields. The lungs are otherwise generally clear. No pleural effusion is seen. There is a right PICC terminating at about the superior  cavoatrial junction. IMPRESSION: Coarse linear scar-like markings in right lower lung field with previous study demonstrating patchy infiltrates in both lower zones and small pleural effusions. Currently no acute lung infiltrate is visualized. Electronically Signed   By: Almira Bar M.D.   On: 04/09/2022 06:34   ECHOCARDIOGRAM LIMITED  Result Date: 04/08/2022    ECHOCARDIOGRAM LIMITED REPORT   Patient Name:   Reginald Avalos Date of Exam: 04/08/2022 Medical Rec #:  [id number]    Height:  70.0 in Accession #:    [id number]   Weight:       173.1 lb Date of Birth:  08-14-1990    BSA:          1.963 m Patient Age:    31 years     BP:           103/70 mmHg Patient Gender: M            HR:           97 bpm. Exam Location:  Inpatient Procedure: Limited Echo, Limited Color Doppler and Cardiac Doppler Indications:    endocarditis  History:        Patient has prior history of Echocardiogram examinations, most                 recent 03/07/2022.  Sonographer:    Delcie Roch RDCS Referring Phys: Effie Shy [place] [facility] IMPRESSIONS  1. Limited study for endocarditis  2. Left ventricular ejection fraction, by estimation, is 60 to 65%. The left ventricle has normal function. The left ventricle has no regional wall motion abnormalities.  3. 1 x 2.4 cm mobile mass attached to the septal leaflet of the valve, consistent with vegetation. An additional mass noted on the anterior leaflet measuring 1 x 1.65 cm which is also consistent with vegetation - the posterior leaflet is not well-visualized. Tricuspid valve regurgitation is moderate. 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  4. The aortic valve is tricuspid. Aortic valve regurgitation is not visualized.  5. There is moderately elevated pulmonary artery systolic pressure.  6. The inferior vena cava is dilated in size with <50% respiratory variability, suggesting right atrial pressure of 15 mmHg. Conclusion(s)/Recommendation(s): Critical findings reported to Dr. Thedore Mins and  acknowledged at 4:23 pm. FINDINGS  Left Ventricle: Left ventricular ejection fraction, by estimation, is 60 to 65%. The left ventricle has normal function. The left ventricle has no regional wall motion abnormalities. Right Ventricle: There is moderately elevated pulmonary artery systolic pressure. The tricuspid regurgitant velocity is 2.78 m/s, and with an assumed right atrial pressure of 15 mmHg, the estimated right ventricular systolic pressure is 45.9 mmHg. Tricuspid Valve: 1 x 2.4 cm mobile mass attached to the septal leaflet of the valve, consistent with vegetation. An additional mass noted on the anterior leaflet measuring 1 x 1.65 cm which is also consistent with vegetation - the posterior leaflet is not well-visualized. Tricuspid valve regurgitation is moderate . 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 Aortic Valve: The aortic valve is tricuspid. Aortic valve regurgitation is not visualized. Pulmonic Valve: The pulmonic valve was not well visualized. Pulmonic valve regurgitation is trivial. Venous: The inferior vena cava is dilated in size with less than 50% respiratory variability, suggesting right atrial pressure of 15 mmHg.  Diastology LV e' medial:  11.90 cm/s LV e' lateral: 14.70 cm/s  IVC IVC diam: 2.20 cm AORTIC VALVE LVOT Vmax:   150.00 cm/s LVOT Vmean:  100.000 cm/s LVOT VTI:    0.230 m TRICUSPID VALVE TR Peak grad:   30.9 mmHg TR Vmax:        278.00 cm/s  SHUNTS Systemic VTI: 0.23 m Zoila Shutter MD Electronically signed by Zoila Shutter MD Signature Date/Time: 04/08/2022/4:39:53 PM    Final      LOS: 43 days   Signature  Susa Raring M.D on 04/09/2022 at 10:05 AM   -  To page go to [email or web address]

## 2022-04-09 NOTE — Progress Notes (Signed)
     [address]       [name] [address]             [phone]       Images reviewed Failed angiovac previously, will discuss plans for TV replacement. Pt will need dental clearance  Reeya Bound [place]

## 2022-04-09 NOTE — Progress Notes (Signed)
   04/09/22 1616  Assess: MEWS Score  Temp 99.4 F (37.4 C)  BP 104/74  MAP (mmHg) 85  Pulse Rate (!) 119  Resp 16  Level of Consciousness Alert  SpO2 98 %  O2 Device Room Air  Assess: MEWS Score  MEWS Temp 0  MEWS Systolic 0  MEWS Pulse 2  MEWS RR 0  MEWS LOC 0  MEWS Score 2  MEWS Score Color Yellow  Assess: if the MEWS score is Yellow or Red  Were vital signs taken at a resting state? Yes  Focused Assessment No change from prior assessment  Does the patient meet 2 or more of the SIRS criteria? Yes  Does the patient have a confirmed or suspected source of infection? Yes  Provider and Rapid Response Notified? No (MD notified)  MEWS guidelines implemented *See Row Information* Yes  Treat  MEWS Interventions Escalated (See documentation below)  Take Vital Signs  Increase Vital Sign Frequency  Yellow: Q 2hr X 2 then Q 4hr X 2, if remains yellow, continue Q 4hrs  Escalate  MEWS: Escalate Yellow: discuss with charge nurse/RN and consider discussing with provider and RRT  Notify: Charge Nurse/RN  Name of Charge Nurse/RN Notified This nurse  Date Charge Nurse/RN Notified 04/09/22  Time Charge Nurse/RN Notified 1642  Notify: Provider  Provider Name/Title Dr. Thedore Mins  Date Provider Notified 04/09/22  Time Provider Notified 1642  Method of Notification Page  Notification Reason Change in status  Provider response See new orders  Date of Provider Response 04/09/22  Time of Provider Response 1645  Document  Progress note created (see row info) Yes  Assess: SIRS CRITERIA  SIRS Temperature  0  SIRS Pulse 1  SIRS Respirations  0  SIRS WBC 1  SIRS Score Sum  2

## 2022-04-10 DIAGNOSIS — B9562 Methicillin resistant Staphylococcus aureus infection as the cause of diseases classified elsewhere: Secondary | ICD-10-CM | POA: Diagnosis not present

## 2022-04-10 DIAGNOSIS — R7989 Other specified abnormal findings of blood chemistry: Secondary | ICD-10-CM | POA: Diagnosis not present

## 2022-04-10 DIAGNOSIS — R7881 Bacteremia: Secondary | ICD-10-CM | POA: Diagnosis not present

## 2022-04-10 DIAGNOSIS — F1990 Other psychoactive substance use, unspecified, uncomplicated: Secondary | ICD-10-CM | POA: Diagnosis not present

## 2022-04-10 DIAGNOSIS — T801XXA Vascular complications following infusion, transfusion and therapeutic injection, initial encounter: Secondary | ICD-10-CM

## 2022-04-10 DIAGNOSIS — Z880 Allergy status to penicillin: Secondary | ICD-10-CM

## 2022-04-10 DIAGNOSIS — K089 Disorder of teeth and supporting structures, unspecified: Secondary | ICD-10-CM

## 2022-04-10 DIAGNOSIS — B182 Chronic viral hepatitis C: Secondary | ICD-10-CM | POA: Diagnosis not present

## 2022-04-10 DIAGNOSIS — A4101 Sepsis due to Methicillin susceptible Staphylococcus aureus: Secondary | ICD-10-CM | POA: Diagnosis not present

## 2022-04-10 LAB — CBC WITH DIFFERENTIAL/PLATELET
Abs Immature Granulocytes: 0.01 10*3/uL (ref 0.00–0.07)
Basophils Absolute: 0 10*3/uL (ref 0.0–0.1)
Basophils Relative: 0 %
Eosinophils Absolute: 0.1 10*3/uL (ref 0.0–0.5)
Eosinophils Relative: 1 %
HCT: 26.7 % — ABNORMAL LOW (ref 39.0–52.0)
Hemoglobin: 8.6 g/dL — ABNORMAL LOW (ref 13.0–17.0)
Immature Granulocytes: 0 %
Lymphocytes Relative: 19 %
Lymphs Abs: 1 10*3/uL (ref 0.7–4.0)
MCH: 27.4 pg (ref 26.0–34.0)
MCHC: 32.2 g/dL (ref 30.0–36.0)
MCV: 85 fL (ref 80.0–100.0)
Monocytes Absolute: 0.7 10*3/uL (ref 0.1–1.0)
Monocytes Relative: 13 %
Neutro Abs: 3.5 10*3/uL (ref 1.7–7.7)
Neutrophils Relative %: 67 %
Platelets: 83 10*3/uL — ABNORMAL LOW (ref 150–400)
RBC: 3.14 MIL/uL — ABNORMAL LOW (ref 4.22–5.81)
RDW: 17.5 % — ABNORMAL HIGH (ref 11.5–15.5)
WBC: 5.2 10*3/uL (ref 4.0–10.5)
nRBC: 0 % (ref 0.0–0.2)

## 2022-04-10 LAB — COMPREHENSIVE METABOLIC PANEL
ALT: 83 U/L — ABNORMAL HIGH (ref 0–44)
AST: 40 U/L (ref 15–41)
Albumin: 2.7 g/dL — ABNORMAL LOW (ref 3.5–5.0)
Alkaline Phosphatase: 70 U/L (ref 38–126)
Anion gap: 7 (ref 5–15)
BUN: 18 mg/dL (ref 6–20)
CO2: 25 mmol/L (ref 22–32)
Calcium: 8.8 mg/dL — ABNORMAL LOW (ref 8.9–10.3)
Chloride: 101 mmol/L (ref 98–111)
Creatinine, Ser: 0.81 mg/dL (ref 0.61–1.24)
GFR, Estimated: >60 mL/min (ref >60)
Glucose, Bld: 103 mg/dL — ABNORMAL HIGH (ref 70–99)
Potassium: 4.5 mmol/L (ref 3.5–5.1)
Sodium: 133 mmol/L — ABNORMAL LOW (ref 135–145)
Total Bilirubin: 0.4 mg/dL (ref 0.3–1.2)
Total Protein: 6.5 g/dL (ref 6.5–8.1)

## 2022-04-10 LAB — PROCALCITONIN: Procalcitonin: <0.1 ng/mL

## 2022-04-10 LAB — MAGNESIUM: Magnesium: 1.7 mg/dL (ref 1.7–2.4)

## 2022-04-10 MED ORDER — SODIUM CHLORIDE 0.9 % IV SOLN
10.0000 mg/kg | Freq: Every day | INTRAVENOUS | Status: DC
  Filled 2022-04-10: qty 13

## 2022-04-10 MED ORDER — SODIUM CHLORIDE 0.9 % IV SOLN
8.0000 mg/kg | Freq: Every day | INTRAVENOUS | Status: DC
  Filled 2022-04-10: qty 10

## 2022-04-10 MED ORDER — SODIUM CHLORIDE 0.9 % IV SOLN
8.0000 mg/kg | Freq: Every day | INTRAVENOUS | Status: DC
  Administered 2022-04-10 – 2022-04-11 (×2): 500 mg via INTRAVENOUS
  Filled 2022-04-10 (×3): qty 10

## 2022-04-10 NOTE — Progress Notes (Signed)
PROGRESS NOTE        PATIENT DETAILS Name: Reginald Clarke Age: 31 y.o. Sex: male Date of Birth: 1991-03-09 Admit Date: 02/25/2022 Admitting Physician Dorcas Carrow, MD RUE:[id number], [name], PA-C  Brief Summary:  Patient is a 31 y.o.  male with history of recurrent MSSA native tricuspid valve endocarditis  Significant events: 4/12-5/10>> hospitalization for MSSA bacteremia with native tricuspid valve endocarditis.  Underwent angio vac procedure on 4/26.Completed cefazolin on 5/10-planned weekly dalbavancin at the ID clinic.   5/22>> outpatient blood culture positive for MSSA 5/24-6/8>> admit to [facility] for persistent bacteremia-worsening back pain-discharge to [facility] on IV Ancef. 7/5>> discharged from [facility] on oral cefdinir.  Continued to have intermittent fever postdischarge.   7/24>> admit for-persistent fever/nausea/vomiting/diarrhea-blood cultures positive for MSSA. 9/5>> supposed to finish IV antibiotic treatment 9/6, but unfortunately he spiked high fever to 103, where his blood culture growing Staph aureus  Significant studies: 7/25>> CT chest: Multifocal airspace opacities-increasing cavitary nodules 7/25>> MRI thoracic spine: No evidence of infection in thoracic spine.  Significant microbiology data: 7/24>> urine culture: Negative 7/25>> blood culture: 1/2-MSSA. 7/27>> blood culture: No growth 7/29>> blood culture: No growth 9/5>> blood cultures Staph aureus 9/5 >> PICC line discontinued  Procedures:  8/3>> TEE: 3 vegetations on tricuspid valve-severe TR.  EF 60-65%  Consults: ID CT suregery Dentist   Subjective:  -Patient reports some redness in the left arm during receiving IV daptomycin yesterday, denies fever or chills.     Objective: Vitals: Blood pressure 111/69, pulse (!) 103, temperature 99.1 F (37.3 C), temperature source Oral, resp. rate 18, height 5\' 10"  (1.778 m), weight 63.6 kg, SpO2 99 %.   Exam:  Awake Alert,  Oriented X 3, No new F.N deficits, Normal affect Symmetrical Chest wall movement, Good air movement bilaterally, CTAB RRR,No Gallops,Rubs or new Murmurs, No Parasternal Heave +ve B.Sounds, Abd Soft, No tenderness, No rebound - guarding or rigidity. No Cyanosis, Clubbing or edema, No new Rash or bruise        Assessment/Plan:  Sepsis due to recurrent MSSA bacteremia with native tricuspid valve endocarditis: - treated with  6 weeks of IV antibiotics, with plan stop date 9/6 . -Fortunately he spiked another fever, repeat blood culture showing Staphylococcus aureus, he remains currently on daptomycin to cover for MSSA and MRSA for tricuspid valve endocarditis. -CT surgery consulted, likely will need CV surgery. -Dentist consulted today. \ Normocytic anemia: Due to chronic disease/recurrent endocarditis: No indication for transfusion.  Anxiety: Stable-on Lexapro-Xanax be slowly weaned down.  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  Mild transaminitis.  Asymptomatic.  Does have chronic hepatitis C but LFTs have risen up in the last few days, asymptomatic, ultrasound stable.  Likely due to daptomycin.  ID monitoring.   Nutrition Status: Nutrition Problem: Inadequate oral intake Etiology: vomiting, nausea Signs/Symptoms: per patient/family report Interventions: Ensure Enlive (each supplement provides 350kcal and 20 grams of protein), MVI, Snacks  BMI: Estimated body mass index is 20.12 kg/m as calculated from the following:   Height as of this encounter: 5\' 10"  (1.778 m).   Weight as of this encounter: 63.6 kg.   Code status:   Code Status: Full Code   DVT Prophylaxis: enoxaparin (LOVENOX) injection 40 mg Start: 02/25/22 1700   Family Communication: None at bedside   Disposition Plan: Status  is: Inpatient    Diet: Diet Order             Diet regular Room service appropriate? Yes; Fluid consistency: Thin   Diet effective now                    MEDICATIONS: Scheduled Meds:  enoxaparin (LOVENOX) injection  40 mg Subcutaneous Q24H   escitalopram  10 mg Oral Daily   feeding supplement  237 mL Oral BID BM   methadone  135 mg Oral Daily   multivitamin with minerals  1 tablet Oral Daily   nicotine  14 mg Transdermal Daily   traZODone  50 mg Oral QHS   Continuous Infusions:  DAPTOmycin (CUBICIN) 500 mg in sodium chloride 0.9 % IVPB 500 mg (04/10/22 1457)   sodium chloride     PRN Meds:.acetaminophen, ALPRAZolam, alum & mag hydroxide-simeth, bisacodyl, diclofenac Sodium, docusate sodium, hydrOXYzine, ibuprofen, loperamide, methocarbamol, polyethylene glycol, promethazine, sodium chloride, sodium chloride flush   I have personally reviewed following labs and imaging studies  LABORATORY DATA: Recent Labs  Lab 04/04/22 0402 04/08/22 0430 04/09/22 0343 04/10/22 0802  WBC 5.7 6.7 8.2 5.2  HGB 8.9* 9.0* 9.6* 8.6*  HCT 28.8* 28.1* 30.9* 26.7*  PLT 168 109* 98* 83*  MCV 88.1 87.3 87.8 85.0  MCH 27.2 28.0 27.3 27.4  MCHC 30.9 32.0 31.1 32.2  RDW 17.9* 17.9* 17.8* 17.5*  LYMPHSABS 1.7 1.3 1.5 1.0  MONOABS 0.4 0.6 0.8 0.7  EOSABS 0.3 0.3 0.1 0.1  BASOSABS 0.0 0.0 0.0 0.0    Recent Labs  Lab 04/04/22 0402 04/05/22 0444 04/06/22 0449 04/08/22 0430 04/08/22 1848 04/09/22 0343 04/10/22 0802  NA 138   < > 138 135 135 138 133*  K 3.8   < > 4.2 4.2 4.1 4.8 4.5  CL 102   < > 103 101 100 103 101  CO2 30   < > 31 29 26 26 25   GLUCOSE 104*   < > 97 101* 93 117* 103*  BUN 25*   < > 17 21* 25* 24* 18  CREATININE 0.76   < > 0.81 0.66 0.68 0.72 0.81  CALCIUM 9.2   < > 9.2 9.1 9.1 9.5 8.8*  AST 70*   < > 76* 54* 55* 44* 40  ALT 117*   < > 128* 108* 103* 95* 83*  ALKPHOS 92   < > 86 75 86 72 70  BILITOT 0.3   < > 0.4 0.6 0.3 0.3 0.4  ALBUMIN 3.0*   < > 3.1* 3.0* 3.0* 3.0* 2.7*  MG 1.9  --   --  1.8  --  2.0 1.7  PROCALCITON  --   --   --   --  <0.10 0.19 <0.10  INR  --   --  1.1   --   --   --   --    < > = values in this interval not displayed.    MICROBIOLOGY: Recent Results (from the past 240 hour(s))  Culture, blood (Routine X 2) w Reflex to ID Panel     Status: Abnormal (Preliminary result)   Collection Time: 04/08/22  7:12 PM   Specimen: BLOOD  Result Value Ref Range Status   Specimen Description BLOOD BLOOD LEFT FOREARM  Final   Special Requests   Final    BOTTLES DRAWN AEROBIC AND ANAEROBIC Blood Culture results may not be optimal due to an inadequate volume of blood received in culture bottles ARPEDB  USED AS AEROBIC BOTTLE   Culture  Setup Time   Final    GRAM POSITIVE COCCI IN CLUSTERS IN BOTH AEROBIC AND ANAEROBIC BOTTLES CRITICAL RESULT CALLED TO, READ BACK BY AND VERIFIED WITH: Chana BodeHARMD M. MITCHELL [phone] @1728  FH Performed at [facility], [address] [place], [place] [address]    Culture STAPHYLOCOCCUS AUREUS (A)  Final   Report Status PENDING  Incomplete  Culture, blood (Routine X 2) w Reflex to ID Panel     Status: Abnormal (Preliminary result)   Collection Time: 04/08/22  7:12 PM   Specimen: BLOOD  Result Value Ref Range Status   Specimen Description BLOOD BLOOD LEFT FOREARM  Final   Special Requests   Final    BOTTLES DRAWN AEROBIC AND ANAEROBIC Blood Culture results may not be optimal due to an inadequate volume of blood received in culture bottles ARPEDB USED AS AEROBIC   Culture  Setup Time   Final    GRAM POSITIVE COCCI IN CLUSTERS IN BOTH AEROBIC AND ANAEROBIC BOTTLES CRITICAL RESULT CALLED TO, READ BACK BY AND VERIFIED WITH: PHARMD MADELEINE MITCHELL ON 04/09/22 @ 1625 BY DRT    Culture (A)  Final    STAPHYLOCOCCUS AUREUS SUSCEPTIBILITIES TO FOLLOW Performed at [facility], [address] [place], [place] [address]    Report Status PENDING  Incomplete  Blood Culture ID Panel (Reflexed)     Status: Abnormal   Collection Time: 04/08/22  7:12 PM  Result Value Ref Range Status   Enterococcus faecalis NOT DETECTED NOT  DETECTED Final   Enterococcus Faecium NOT DETECTED NOT DETECTED Final   Listeria monocytogenes NOT DETECTED NOT DETECTED Final   Staphylococcus species DETECTED (A) NOT DETECTED Final    Comment: CRITICAL RESULT CALLED TO, READ BACK BY AND VERIFIED WITH: PHARMD MADELEINE MITCHELL ON 04/09/22 @ 1625 BY DRT    Staphylococcus aureus (BCID) DETECTED (A) NOT DETECTED Final    Comment: CRITICAL RESULT CALLED TO, READ BACK BY AND VERIFIED WITH: PHARMD MADELEINE MITCHELL ON 04/09/22 @ 1625 BY DRT    Staphylococcus epidermidis NOT DETECTED NOT DETECTED Final   Staphylococcus lugdunensis NOT DETECTED NOT DETECTED Final   Streptococcus species NOT DETECTED NOT DETECTED Final   Streptococcus agalactiae NOT DETECTED NOT DETECTED Final   Streptococcus pneumoniae NOT DETECTED NOT DETECTED Final   Streptococcus pyogenes NOT DETECTED NOT DETECTED Final   A.calcoaceticus-baumannii NOT DETECTED NOT DETECTED Final   Bacteroides fragilis NOT DETECTED NOT DETECTED Final   Enterobacterales NOT DETECTED NOT DETECTED Final   Enterobacter cloacae complex NOT DETECTED NOT DETECTED Final   Escherichia coli NOT DETECTED NOT DETECTED Final   Klebsiella aerogenes NOT DETECTED NOT DETECTED Final   Klebsiella oxytoca NOT DETECTED NOT DETECTED Final   Klebsiella pneumoniae NOT DETECTED NOT DETECTED Final   Proteus species NOT DETECTED NOT DETECTED Final   Salmonella species NOT DETECTED NOT DETECTED Final   Serratia marcescens NOT DETECTED NOT DETECTED Final   Haemophilus influenzae NOT DETECTED NOT DETECTED Final   Neisseria meningitidis NOT DETECTED NOT DETECTED Final   Pseudomonas aeruginosa NOT DETECTED NOT DETECTED Final   Stenotrophomonas maltophilia NOT DETECTED NOT DETECTED Final   Candida albicans NOT DETECTED NOT DETECTED Final   Candida auris NOT DETECTED NOT DETECTED Final   Candida glabrata NOT DETECTED NOT DETECTED Final   Candida krusei NOT DETECTED NOT DETECTED Final   Candida parapsilosis NOT DETECTED  NOT DETECTED Final   Candida tropicalis NOT DETECTED NOT DETECTED Final  Cryptococcus neoformans/gattii NOT DETECTED NOT DETECTED Final   Meth resistant mecA/C and MREJ NOT DETECTED NOT DETECTED Final    Comment: Performed at [facility], [address] [place], [place] [address]  SARS Coronavirus 2 by RT PCR (hospital order, performed in [facility] hospital lab) *cepheid single result test* Anterior Nasal Swab     Status: None   Collection Time: 04/08/22  8:52 PM   Specimen: Anterior Nasal Swab  Result Value Ref Range Status   SARS Coronavirus 2 by RT PCR NEGATIVE NEGATIVE Final    Comment: (NOTE) SARS-CoV-2 target nucleic acids are NOT DETECTED.  The SARS-CoV-2 RNA is generally detectable in upper and lower respiratory specimens during the acute phase of infection. The lowest concentration of SARS-CoV-2 viral copies this assay can detect is 250 copies / mL. A negative result does not preclude SARS-CoV-2 infection and should not be used as the sole basis for treatment or other patient management decisions.  A negative result may occur with improper specimen collection / handling, submission of specimen other than nasopharyngeal swab, presence of viral mutation(s) within the areas targeted by this assay, and inadequate number of viral copies (<250 copies / mL). A negative result must be combined with clinical observations, patient history, and epidemiological information.  Fact Sheet for Patients:   RoadLapTop.co.za  Fact Sheet for Healthcare Providers: http://kim-miller.com/  This test is not yet approved or  cleared by the [place] FDA and has been authorized for detection and/or diagnosis of SARS-CoV-2 by FDA under an Emergency Use Authorization (EUA).  This EUA will remain in effect (meaning this test can be used) for the duration of the COVID-19 declaration under Section 564(b)(1) of the Act, 21 U.S.C. section  360bbb-3(b)(1), unless the authorization is terminated or revoked sooner.  Performed at [facility], [address] [place], [place] [address]     RADIOLOGY STUDIES/RESULTS: DG Chest Port 1 View  Result Date: 04/09/2022 CLINICAL DATA:  [id number]. Fever this morning with a prior history of sepsis. EXAM: PORTABLE CHEST 1 VIEW COMPARISON:  PA Lat chest 03/11/2022, chest CT 02/26/2022. FINDINGS: The cardiomediastinal silhouette and vascular pattern are normal. There are coarse linear scar-like opacities in the right lower lung field with previous study demonstrating patchy infiltrates in both lower lung fields. The lungs are otherwise generally clear. No pleural effusion is seen. There is a right PICC terminating at about the superior cavoatrial junction. IMPRESSION: Coarse linear scar-like markings in right lower lung field with previous study demonstrating patchy infiltrates in both lower zones and small pleural effusions. Currently no acute lung infiltrate is visualized. Electronically Signed   By: Almira Bar M.D.   On: 04/09/2022 06:34   ECHOCARDIOGRAM LIMITED  Result Date: 04/08/2022    ECHOCARDIOGRAM LIMITED REPORT   Patient Name:   Reginald Phimmasone Date of Exam: 04/08/2022 Medical Rec #:  [id number]    Height:       70.0 in Accession #:    [id number]   Weight:       173.1 lb Date of Birth:  [birth year]    BSA:          1.963 m Patient Age:    31 years     BP:           103/70 mmHg Patient Gender: M            HR:           97 bpm. Exam Location:  Inpatient Procedure: Limited  Echo, Limited Color Doppler and Cardiac Doppler Indications:    endocarditis  History:        Patient has prior history of Echocardiogram examinations, most                 recent 03/07/2022.  Sonographer:    Delcie Roch RDCS Referring Phys: Effie Shy [facility] IMPRESSIONS  1. Limited study for endocarditis  2. Left ventricular ejection fraction, by estimation, is 60 to 65%. The left ventricle has normal function. The left  ventricle has no regional wall motion abnormalities.  3. 1 x 2.4 cm mobile mass attached to the septal leaflet of the valve, consistent with vegetation. An additional mass noted on the anterior leaflet measuring 1 x 1.65 cm which is also consistent with vegetation - the posterior leaflet is not well-visualized. Tricuspid valve regurgitation is moderate. 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  4. The aortic valve is tricuspid. Aortic valve regurgitation is not visualized.  5. There is moderately elevated pulmonary artery systolic pressure.  6. The inferior vena cava is dilated in size with <50% respiratory variability, suggesting right atrial pressure of 15 mmHg. Conclusion(s)/Recommendation(s): Critical findings reported to Dr. Thedore Mins and acknowledged at 4:23 pm. FINDINGS  Left Ventricle: Left ventricular ejection fraction, by estimation, is 60 to 65%. The left ventricle has normal function. The left ventricle has no regional wall motion abnormalities. Right Ventricle: There is moderately elevated pulmonary artery systolic pressure. The tricuspid regurgitant velocity is 2.78 m/s, and with an assumed right atrial pressure of 15 mmHg, the estimated right ventricular systolic pressure is 45.9 mmHg. Tricuspid Valve: 1 x 2.4 cm mobile mass attached to the septal leaflet of the valve, consistent with vegetation. An additional mass noted on the anterior leaflet measuring 1 x 1.65 cm which is also consistent with vegetation - the posterior leaflet is not well-visualized. Tricuspid valve regurgitation is moderate . 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 Aortic Valve: The aortic valve is tricuspid. Aortic valve regurgitation is not visualized. Pulmonic Valve: The pulmonic valve was not well visualized. Pulmonic valve regurgitation is trivial. Venous: The inferior vena cava is dilated in size with less than 50% respiratory variability, suggesting right atrial pressure of 15 mmHg.  Diastology LV e' medial:  11.90 cm/s LV e' lateral:  14.70 cm/s  IVC IVC diam: 2.20 cm AORTIC VALVE LVOT Vmax:   150.00 cm/s LVOT Vmean:  100.000 cm/s LVOT VTI:    0.230 m TRICUSPID VALVE TR Peak grad:   30.9 mmHg TR Vmax:        278.00 cm/s  SHUNTS Systemic VTI: 0.23 m Zoila Shutter MD Electronically signed by Zoila Shutter MD Signature Date/Time: 04/08/2022/4:39:53 PM    Final      LOS: 44 days   Signature  Huey Bienenstock M.D on 04/10/2022 at 3:27 PM   -  To page go to [email or web address]

## 2022-04-10 NOTE — Progress Notes (Signed)
Subjective: Patient had a reaction in the left arm with infusion of daptomycin and with infusion of Benadryl suspicious for infiltration of his line  Antibiotics:  Anti-infectives (From admission, onward)    Start     Dose/Rate Route Frequency Ordered Stop   04/10/22 2000  DAPTOmycin (CUBICIN) 650 mg in sodium chloride 0.9 % IVPB  Status:  Discontinued        10 mg/kg  63.6 kg (Adjusted) 126 mL/hr over 30 Minutes Intravenous Daily 04/10/22 1053 04/10/22 1104   04/10/22 2000  DAPTOmycin (CUBICIN) 500 mg in sodium chloride 0.9 % IVPB  Status:  Discontinued        8 mg/kg  63.6 kg (Adjusted) 120 mL/hr over 30 Minutes Intravenous Daily 04/10/22 1104 04/10/22 1110   04/10/22 1400  DAPTOmycin (CUBICIN) 500 mg in sodium chloride 0.9 % IVPB        8 mg/kg  63.6 kg (Adjusted) 120 mL/hr over 30 Minutes Intravenous Daily 04/10/22 1110 05/02/22 1959   04/09/22 2000  DAPTOmycin (CUBICIN) 750 mg in sodium chloride 0.9 % IVPB  Status:  Discontinued        10 mg/kg  74 kg 130 mL/hr over 30 Minutes Intravenous Daily 04/09/22 1340 04/09/22 2134   03/12/22 1500  doxycycline (VIBRA-TABS) tablet 100 mg        100 mg Oral 2 times daily 03/12/22 1412 03/19/22 0852   03/09/22 2000  DAPTOmycin (CUBICIN) 750 mg in sodium chloride 0.9 % IVPB  Status:  Discontinued        10 mg/kg  74 kg 130 mL/hr over 30 Minutes Intravenous Daily 03/09/22 1036 04/09/22 1340   02/28/22 1245  DAPTOmycin (CUBICIN) 600 mg in sodium chloride 0.9 % IVPB  Status:  Discontinued        8 mg/kg  74 kg 124 mL/hr over 30 Minutes Intravenous Daily 02/28/22 1145 03/09/22 1036   02/25/22 1745  ceFAZolin (ANCEF) IVPB 2g/100 mL premix  Status:  Discontinued        2 g 200 mL/hr over 30 Minutes Intravenous Every 8 hours 02/25/22 1734 03/09/22 1036       Medications: Scheduled Meds:  enoxaparin (LOVENOX) injection  40 mg Subcutaneous Q24H   escitalopram  10 mg Oral Daily   feeding supplement  237 mL Oral BID BM    methadone  135 mg Oral Daily   multivitamin with minerals  1 tablet Oral Daily   nicotine  14 mg Transdermal Daily   traZODone  50 mg Oral QHS   Continuous Infusions:  DAPTOmycin (CUBICIN) 500 mg in sodium chloride 0.9 % IVPB     sodium chloride     PRN Meds:.acetaminophen, ALPRAZolam, alum & mag hydroxide-simeth, bisacodyl, diclofenac Sodium, docusate sodium, hydrOXYzine, ibuprofen, loperamide, methocarbamol, polyethylene glycol, promethazine, sodium chloride, sodium chloride flush    Objective: Weight change:  No intake or output data in the 24 hours ending 04/10/22 1437  Blood pressure 111/69, pulse (!) 103, temperature 99.1 F (37.3 C), temperature source Oral, resp. rate 18, height 5\' 10"  (1.778 m), weight 63.6 kg, SpO2 99 %. Temp:  [97.6 F (36.4 C)-99.4 F (37.4 C)] 99.1 F (37.3 C) (09/06 0832) Pulse Rate:  [103-119] 103 (09/06 0832) Resp:  [16-18] 18 (09/06 0832) BP: (89-111)/(54-74) 111/69 (09/06 0832) SpO2:  [97 %-99 %] 99 % (09/05 2055)  Physical Exam: Physical Exam Constitutional:      Appearance: He is well-developed.  HENT:     Head: Normocephalic and atraumatic.  Eyes:     Conjunctiva/sclera: Conjunctivae normal.  Cardiovascular:     Rate and Rhythm: Normal rate and regular rhythm.     Heart sounds: Murmur heard.  Pulmonary:     Effort: Pulmonary effort is normal. No respiratory distress.     Breath sounds: Normal breath sounds. No stridor. No wheezing.  Abdominal:     General: There is no distension.     Palpations: Abdomen is soft.  Musculoskeletal:        General: Normal range of motion.     Cervical back: Normal range of motion and neck supple.  Skin:    General: Skin is warm and dry.     Findings: No erythema or rash.  Neurological:     General: No focal deficit present.     Mental Status: He is alert and oriented to person, place, and time.  Psychiatric:        Mood and Affect: Mood normal.        Behavior: Behavior normal.        Thought  Content: Thought content normal.        Judgment: Judgment normal.     PIV looks to be infiltrated  CBC:    BMET Recent Labs    04/09/22 0343 04/10/22 0802  NA 138 133*  K 4.8 4.5  CL 103 101  CO2 26 25  GLUCOSE 117* 103*  BUN 24* 18  CREATININE 0.72 0.81  CALCIUM 9.5 8.8*      Liver Panel  Recent Labs    04/09/22 0343 04/10/22 0802  PROT 7.2 6.5  ALBUMIN 3.0* 2.7*  AST 44* 40  ALT 95* 83*  ALKPHOS 72 70  BILITOT 0.3 0.4        Sedimentation Rate No results for input(s): "ESRSEDRATE" in the last 72 hours. C-Reactive Protein No results for input(s): "CRP" in the last 72 hours.  Micro Results: Recent Results (from the past 720 hour(s))  Culture, blood (Routine X 2) w Reflex to ID Panel     Status: Abnormal (Preliminary result)   Collection Time: 04/08/22  7:12 PM   Specimen: BLOOD  Result Value Ref Range Status   Specimen Description BLOOD BLOOD LEFT FOREARM  Final   Special Requests   Final    BOTTLES DRAWN AEROBIC AND ANAEROBIC Blood Culture results may not be optimal due to an inadequate volume of blood received in culture bottles ARPEDB USED AS AEROBIC BOTTLE   Culture  Setup Time   Final    GRAM POSITIVE COCCI IN CLUSTERS IN BOTH AEROBIC AND ANAEROBIC BOTTLES CRITICAL RESULT CALLED TO, READ BACK BY AND VERIFIED WITH: Chana Bode [id number] @1728  FH Performed at [facility], [address] [place], [place]    Culture STAPHYLOCOCCUS AUREUS (A)  Final   Report Status PENDING  Incomplete  Culture, blood (Routine X 2) w Reflex to ID Panel     Status: Abnormal (Preliminary result)   Collection Time: 04/08/22  7:12 PM   Specimen: BLOOD  Result Value Ref Range Status   Specimen Description BLOOD BLOOD LEFT FOREARM  Final   Special Requests   Final    BOTTLES DRAWN AEROBIC AND ANAEROBIC Blood Culture results may not be optimal due to an inadequate volume of blood received in culture bottles ARPEDB USED AS AEROBIC   Culture   Setup Time   Final    GRAM POSITIVE COCCI IN CLUSTERS IN BOTH AEROBIC AND ANAEROBIC BOTTLES CRITICAL RESULT CALLED TO,  READ BACK BY AND VERIFIED WITH: PHARMD MADELEINE MITCHELL ON 04/09/22 @ 1625 BY DRT    Culture (A)  Final    STAPHYLOCOCCUS AUREUS SUSCEPTIBILITIES TO FOLLOW Performed at [facility], [place] [address] [place], [place] [address]    Report Status PENDING  Incomplete  Blood Culture ID Panel (Reflexed)     Status: Abnormal   Collection Time: 04/08/22  7:12 PM  Result Value Ref Range Status   Enterococcus faecalis NOT DETECTED NOT DETECTED Final   Enterococcus Faecium NOT DETECTED NOT DETECTED Final   Listeria monocytogenes NOT DETECTED NOT DETECTED Final   Staphylococcus species DETECTED (A) NOT DETECTED Final    Comment: CRITICAL RESULT CALLED TO, READ BACK BY AND VERIFIED WITH: PHARMD MADELEINE MITCHELL ON 04/09/22 @ 1625 BY DRT    Staphylococcus aureus (BCID) DETECTED (A) NOT DETECTED Final    Comment: CRITICAL RESULT CALLED TO, READ BACK BY AND VERIFIED WITH: PHARMD MADELEINE MITCHELL ON 04/09/22 @ 1625 BY DRT    Staphylococcus epidermidis NOT DETECTED NOT DETECTED Final   Staphylococcus lugdunensis NOT DETECTED NOT DETECTED Final   Streptococcus species NOT DETECTED NOT DETECTED Final   Streptococcus agalactiae NOT DETECTED NOT DETECTED Final   Streptococcus pneumoniae NOT DETECTED NOT DETECTED Final   Streptococcus pyogenes NOT DETECTED NOT DETECTED Final   A.calcoaceticus-baumannii NOT DETECTED NOT DETECTED Final   Bacteroides fragilis NOT DETECTED NOT DETECTED Final   Enterobacterales NOT DETECTED NOT DETECTED Final   Enterobacter cloacae complex NOT DETECTED NOT DETECTED Final   Escherichia coli NOT DETECTED NOT DETECTED Final   Klebsiella aerogenes NOT DETECTED NOT DETECTED Final   Klebsiella oxytoca NOT DETECTED NOT DETECTED Final   Klebsiella pneumoniae NOT DETECTED NOT DETECTED Final   Proteus species NOT DETECTED NOT DETECTED Final   Salmonella  species NOT DETECTED NOT DETECTED Final   Serratia marcescens NOT DETECTED NOT DETECTED Final   Haemophilus influenzae NOT DETECTED NOT DETECTED Final   Neisseria meningitidis NOT DETECTED NOT DETECTED Final   Pseudomonas aeruginosa NOT DETECTED NOT DETECTED Final   Stenotrophomonas maltophilia NOT DETECTED NOT DETECTED Final   Candida albicans NOT DETECTED NOT DETECTED Final   Candida auris NOT DETECTED NOT DETECTED Final   Candida glabrata NOT DETECTED NOT DETECTED Final   Candida krusei NOT DETECTED NOT DETECTED Final   Candida parapsilosis NOT DETECTED NOT DETECTED Final   Candida tropicalis NOT DETECTED NOT DETECTED Final   Cryptococcus neoformans/gattii NOT DETECTED NOT DETECTED Final   Meth resistant mecA/C and MREJ NOT DETECTED NOT DETECTED Final    Comment: Performed at [facility], [place]. [address], [place], [place] [address]  SARS Coronavirus 2 by RT PCR (hospital order, performed in [facility] hospital lab) *cepheid single result test* Anterior Nasal Swab     Status: None   Collection Time: 04/08/22  8:52 PM   Specimen: Anterior Nasal Swab  Result Value Ref Range Status   SARS Coronavirus 2 by RT PCR NEGATIVE NEGATIVE Final    Comment: (NOTE) SARS-CoV-2 target nucleic acids are NOT DETECTED.  The SARS-CoV-2 RNA is generally detectable in upper and lower respiratory specimens during the acute phase of infection. The lowest concentration of SARS-CoV-2 viral copies this assay can detect is 250 copies / mL. A negative result does not preclude SARS-CoV-2 infection and should not be used as the sole basis for treatment or other patient management decisions.  A negative result may occur with improper specimen collection / handling, submission of specimen other than nasopharyngeal  swab, presence of viral mutation(s) within the areas targeted by this assay, and inadequate number of viral copies (<250 copies / mL). A negative result must be combined with  clinical observations, patient history, and epidemiological information.  Fact Sheet for Patients:   https://www.patel.info/  Fact Sheet for Healthcare Providers: https://hall.com/  This test is not yet approved or  cleared by the [place] FDA and has been authorized for detection and/or diagnosis of SARS-CoV-2 by FDA under an Emergency Use Authorization (EUA).  This EUA will remain in effect (meaning this test can be used) for the duration of the COVID-19 declaration under Section 564(b)(1) of the Act, 21 U.S.C. section 360bbb-3(b)(1), unless the authorization is terminated or revoked sooner.  Performed at [facility], [place] [address], [place], [place] [address]     Studies/Results: DG Chest Port 1 View  Result Date: 04/09/2022 CLINICAL DATA:  [id number]. Fever this morning with a prior history of sepsis. EXAM: PORTABLE CHEST 1 VIEW COMPARISON:  PA Lat chest 03/11/2022, chest CT 02/26/2022. FINDINGS: The cardiomediastinal silhouette and vascular pattern are normal. There are coarse linear scar-like opacities in the right lower lung field with previous study demonstrating patchy infiltrates in both lower lung fields. The lungs are otherwise generally clear. No pleural effusion is seen. There is a right PICC terminating at about the superior cavoatrial junction. IMPRESSION: Coarse linear scar-like markings in right lower lung field with previous study demonstrating patchy infiltrates in both lower zones and small pleural effusions. Currently no acute lung infiltrate is visualized. Electronically Signed   By: Telford Nab M.D.   On: 04/09/2022 06:34   ECHOCARDIOGRAM LIMITED  Result Date: 04/08/2022    ECHOCARDIOGRAM LIMITED REPORT   Patient Name:   Martinique Randon Date of Exam: 04/08/2022 Medical Rec #:  [id number]    Height:       70.0 in Accession #:    [id number]   Weight:       173.1 lb Date of Birth:  11-10-1990    BSA:          1.963 m  Patient Age:    31 years     BP:           103/70 mmHg Patient Gender: M            HR:           97 bpm. Exam Location:  Inpatient Procedure: Limited Echo, Limited Color Doppler and Cardiac Doppler Indications:    endocarditis  History:        Patient has prior history of Echocardiogram examinations, most                 recent 03/07/2022.  Sonographer:    Johny Chess RDCS Referring Phys: [place]  1. Limited study for endocarditis  2. Left ventricular ejection fraction, by estimation, is 60 to 65%. The left ventricle has normal function. The left ventricle has no regional wall motion abnormalities.  3. 1 x 2.4 cm mobile mass attached to the septal leaflet of the valve, consistent with vegetation. An additional mass noted on the anterior leaflet measuring 1 x 1.65 cm which is also consistent with vegetation - the posterior leaflet is not well-visualized. Tricuspid valve regurgitation is moderate. 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  4. The aortic valve is tricuspid. Aortic valve regurgitation is not visualized.  5. There is moderately elevated pulmonary artery systolic pressure.  6. The inferior vena cava is dilated in size with <  50% respiratory variability, suggesting right atrial pressure of 15 mmHg. Conclusion(s)/Recommendation(s): Critical findings reported to Dr. Thedore Mins and acknowledged at 4:23 pm. FINDINGS  Left Ventricle: Left ventricular ejection fraction, by estimation, is 60 to 65%. The left ventricle has normal function. The left ventricle has no regional wall motion abnormalities. Right Ventricle: There is moderately elevated pulmonary artery systolic pressure. The tricuspid regurgitant velocity is 2.78 m/s, and with an assumed right atrial pressure of 15 mmHg, the estimated right ventricular systolic pressure is 45.9 mmHg. Tricuspid Valve: 1 x 2.4 cm mobile mass attached to the septal leaflet of the valve, consistent with vegetation. An additional mass noted on the anterior  leaflet measuring 1 x 1.65 cm which is also consistent with vegetation - the posterior leaflet is not well-visualized. Tricuspid valve regurgitation is moderate . 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 Aortic Valve: The aortic valve is tricuspid. Aortic valve regurgitation is not visualized. Pulmonic Valve: The pulmonic valve was not well visualized. Pulmonic valve regurgitation is trivial. Venous: The inferior vena cava is dilated in size with less than 50% respiratory variability, suggesting right atrial pressure of 15 mmHg.  Diastology LV e' medial:  11.90 cm/s LV e' lateral: 14.70 cm/s  IVC IVC diam: 2.20 cm AORTIC VALVE LVOT Vmax:   150.00 cm/s LVOT Vmean:  100.000 cm/s LVOT VTI:    0.230 m TRICUSPID VALVE TR Peak grad:   30.9 mmHg TR Vmax:        278.00 cm/s  SHUNTS Systemic VTI: 0.23 m Zoila Shutter MD Electronically signed by Zoila Shutter MD Signature Date/Time: 04/08/2022/4:39:53 PM    Final       Assessment/Plan:  INTERVAL HISTORY: PICC line discontinued peripheral IV placed though it now is appears to be infiltrated and he had a reaction confined to the left arm where the IV was placed when he had infusion of daptomycin and Benadryl.    Principal Problem:   Sepsis ([name]) Active Problems:   MRSA bacteremia   Chronic pain syndrome/methadone abuse    Tobacco abuse   Polysubstance abuse ([name])   Hepatitis C, chronic ([name])   Anxiety   Normocytic anemia   Prolonged QT interval   Elevated serum creatinine   Hypokalemia   Hypomagnesemia   Hyponatremia   Subacute bacterial endocarditis   MSSA bacteremia   IVDU (intravenous drug user)   Pneumonia of both lower lobes due to methicillin resistant Staphylococcus aureus (MRSA) ([name])    Reginald Clarke is a 31 y.o. male with history of IV drug use MSSA bacteremia endocarditis status post angio back in April 2023 with residual subvalvular vegetation not amenable to repeat angio VAC.  History of cefazolin followed by dalbavancin.  He had persistent  MSSA bacteremia in May 2023 with thoracic spine discitis sp 6 weeks of cefazolin followed by cefdinir then MRSA bacteremia in context of PICC line with  TV endocarditis, currently on daptomycin to cover for possibility of MSSA and MRSA tricuspid valve endocarditis.  He was on course to complete therapy but became acutely febrile and diaphoretic.  Blood cultures were taken and a repeat 2D echocardiogram was performed This is shown 1.2.4's mobile mass on the septal leaflet of the valve consistent with previous to vegetation as well as a mass in the anterior leaflet that is 1 x 1.65 cm.  The possibility was not well visualized his tricuspid valve regurgitation is now moderate with mild to moderate tricuspid stenosis  He is now going blood cultures from 2 peripheral sites despite being on effective  antibiotics.  This is undoubtedly due to his tricuspid valve endocarditis and need for valve replacement with ongoing bacteremia in the face of appropriate antimicrobial therapy.  We did discontinue his PICC line as it is undoubtably seeded with Staphylococcus aureus as well.  We would like to have a peripheral IV placed and to reinitiate daptomycin as we are specialist that his reaction was more 1 of an infiltration of his line rather than a systemic allergic reaction which should have spread beyond the left arm and should not have resolved so quickly.  Dr. Kipp Brood is seen the patient and the patient will indeed need valve replacement he is going to need dental clearance first which I hope happens promptly  PCN allergy: Post amoxicillin challenge he is already been tolerating amoxicillin so his penicillin allergy is clearly no longer in existence.  Alleged allergic reaction to daptomycin: Beginning is think this is due to infiltration of his IV.  Chronic hepatitis C without hepatic coma HCVRNA repeat is pending.  I spent 47minutes with the patient including than 50% of the time in face to face  counseling of the patient  re his MSSA/MRSA infection, TV endocarditis with PCN allergy, reaction to infiltrated IV  along with review of medical records in preparation for the visit and during the visit and in coordination of his care.     LOS: 29 days   Alcide Evener 04/10/2022, 2:37 PM

## 2022-04-10 NOTE — Progress Notes (Signed)
After start of Daptomycin infusion, patient developed red hives up his arm around veins and the iv site. Infusion stopped. Physician notified. Daptomycin discontinued. Benadryl ordered. Benadryl gave same reaction. Pharmacy consult order and pharmacist told of reactions to both medications.

## 2022-04-10 NOTE — Progress Notes (Signed)
PHARMACY ANTIBIOTIC CONSULT NOTE   Reginald Clarke a 31 y.o. male admitted with TV infective endocarditis. Patient is in his third course of treatment for IE with angiovac performed on TV. His initial cultures grew MSSA, however cultures from 02/24/22 grew both MSSA and MRSA. The MRSA is believed to be the result of a line infection. However, in light of recurrent infection ID has opted to treat as mixed MSSA/MRSA due to the possibility MRSA may have stuck to his valve.On the night of 9/4 patient spiked a fever to 101.2 F, and repeat blood cultures were performed which grew 4/4 MSSA. The evening of 9/5 patient's PIV infiltrated with daptomycin- initially thought to be an allergic reaction. After discussion with the patient, we learned he is a difficult stick and typically has to have IV team place lines. CTS has been consulted and planning to relace TV pending dental evaluation. Pharmacy has been consulted for daptomycin dosing.  Estimated Creatinine Clearance: 118.9 mL/min (by C-G formula based on SCr of 0.81 mg/dL).  Plan: Planning to have IV team place new PIV in R arm Resume Daptomycin 500 mg IV Q24H (~8 mg/kg) Q24H  CK and CMP Qmon/Thurs (currently one level ordered pending tolerability of drug)   F/U ID CTS/ID Recs  F/U tolerability of daptomycin   Allergies:  No Active Allergies  Filed Weights   03/31/22 0500 04/01/22 0500 04/09/22 0500  Weight: 78.1 kg (172 lb 2.9 oz) 78.5 kg (173 lb 1 oz) 63.6 kg (140 lb 3.4 oz)       Latest Ref Rng & Units 04/10/2022    8:02 AM 04/09/2022    3:43 AM 04/08/2022    4:30 AM  CBC  WBC 4.0 - 10.5 K/uL 5.2  8.2  6.7   Hemoglobin 13.0 - 17.0 g/dL 8.6  9.6  9.0   Hematocrit 39.0 - 52.0 % 26.7  30.9  28.1   Platelets 150 - 400 K/uL 83  98  109     Antibiotics Given (last 72 hours)     Date/Time Action Medication Dose Rate   04/07/22 2038 New Bag/Given   DAPTOmycin (CUBICIN) 750 mg in sodium chloride 0.9 % IVPB 750 mg 130 mL/hr   04/08/22 2102 New  Bag/Given   DAPTOmycin (CUBICIN) 750 mg in sodium chloride 0.9 % IVPB 750 mg 130 mL/hr   04/09/22 2052 New Bag/Given   DAPTOmycin (CUBICIN) 750 mg in sodium chloride 0.9 % IVPB 750 mg 130 mL/hr       Microbiology results: 9/4 Bcx (most recent): 4/4 MSSA   Thank you for allowing pharmacy to be a part of this patient's care.  Jani Gravel, PharmD PGY-2 Infectious Diseases Resident  04/10/2022 10:56 AM

## 2022-04-11 ENCOUNTER — Inpatient Hospital Stay (HOSPITAL_COMMUNITY): Payer: BLUE CROSS/BLUE SHIELD

## 2022-04-11 DIAGNOSIS — F1910 Other psychoactive substance abuse, uncomplicated: Secondary | ICD-10-CM | POA: Diagnosis not present

## 2022-04-11 DIAGNOSIS — D696 Thrombocytopenia, unspecified: Secondary | ICD-10-CM

## 2022-04-11 DIAGNOSIS — R7881 Bacteremia: Secondary | ICD-10-CM | POA: Diagnosis not present

## 2022-04-11 DIAGNOSIS — F1990 Other psychoactive substance use, unspecified, uncomplicated: Secondary | ICD-10-CM | POA: Diagnosis not present

## 2022-04-11 DIAGNOSIS — B182 Chronic viral hepatitis C: Secondary | ICD-10-CM | POA: Diagnosis not present

## 2022-04-11 DIAGNOSIS — A4101 Sepsis due to Methicillin susceptible Staphylococcus aureus: Secondary | ICD-10-CM | POA: Diagnosis not present

## 2022-04-11 LAB — CBC WITH DIFFERENTIAL/PLATELET
Abs Immature Granulocytes: 0.02 10*3/uL (ref 0.00–0.07)
Basophils Absolute: 0 10*3/uL (ref 0.0–0.1)
Basophils Relative: 0 %
Eosinophils Absolute: 0.1 10*3/uL (ref 0.0–0.5)
Eosinophils Relative: 1 %
HCT: 28 % — ABNORMAL LOW (ref 39.0–52.0)
Hemoglobin: 9.1 g/dL — ABNORMAL LOW (ref 13.0–17.0)
Immature Granulocytes: 0 %
Lymphocytes Relative: 17 %
Lymphs Abs: 1.4 10*3/uL (ref 0.7–4.0)
MCH: 27.7 pg (ref 26.0–34.0)
MCHC: 32.5 g/dL (ref 30.0–36.0)
MCV: 85.1 fL (ref 80.0–100.0)
Monocytes Absolute: 0.7 10*3/uL (ref 0.1–1.0)
Monocytes Relative: 9 %
Neutro Abs: 6 10*3/uL (ref 1.7–7.7)
Neutrophils Relative %: 73 %
Platelets: 100 10*3/uL — ABNORMAL LOW (ref 150–400)
RBC: 3.29 MIL/uL — ABNORMAL LOW (ref 4.22–5.81)
RDW: 17.2 % — ABNORMAL HIGH (ref 11.5–15.5)
WBC: 8.2 10*3/uL (ref 4.0–10.5)
nRBC: 0 % (ref 0.0–0.2)

## 2022-04-11 LAB — MAGNESIUM: Magnesium: 2 mg/dL (ref 1.7–2.4)

## 2022-04-11 LAB — COMPREHENSIVE METABOLIC PANEL
ALT: 68 U/L — ABNORMAL HIGH (ref 0–44)
AST: 28 U/L (ref 15–41)
Albumin: 3 g/dL — ABNORMAL LOW (ref 3.5–5.0)
Alkaline Phosphatase: 70 U/L (ref 38–126)
Anion gap: 9 (ref 5–15)
BUN: 10 mg/dL (ref 6–20)
CO2: 25 mmol/L (ref 22–32)
Calcium: 9 mg/dL (ref 8.9–10.3)
Chloride: 101 mmol/L (ref 98–111)
Creatinine, Ser: 0.87 mg/dL (ref 0.61–1.24)
GFR, Estimated: >60 mL/min (ref >60)
Glucose, Bld: 137 mg/dL — ABNORMAL HIGH (ref 70–99)
Potassium: 3.7 mmol/L (ref 3.5–5.1)
Sodium: 135 mmol/L (ref 135–145)
Total Bilirubin: 0.8 mg/dL (ref 0.3–1.2)
Total Protein: 7.2 g/dL (ref 6.5–8.1)

## 2022-04-11 LAB — CK: Total CK: 15 U/L — ABNORMAL LOW (ref 49–397)

## 2022-04-11 NOTE — Progress Notes (Signed)
Subjective:  Patient felt poorly last night with fevers and diaphoresis  Antibiotics:  Anti-infectives (From admission, onward)    Start     Dose/Rate Route Frequency Ordered Stop   04/10/22 2000  DAPTOmycin (CUBICIN) 650 mg in sodium chloride 0.9 % IVPB  Status:  Discontinued        10 mg/kg  63.6 kg (Adjusted) 126 mL/hr over 30 Minutes Intravenous Daily 04/10/22 1053 04/10/22 1104   04/10/22 2000  DAPTOmycin (CUBICIN) 500 mg in sodium chloride 0.9 % IVPB  Status:  Discontinued        8 mg/kg  63.6 kg (Adjusted) 120 mL/hr over 30 Minutes Intravenous Daily 04/10/22 1104 04/10/22 1110   04/10/22 1400  DAPTOmycin (CUBICIN) 500 mg in sodium chloride 0.9 % IVPB        8 mg/kg  63.6 kg (Adjusted) 120 mL/hr over 30 Minutes Intravenous Daily 04/10/22 1110 05/02/22 1959   04/09/22 2000  DAPTOmycin (CUBICIN) 750 mg in sodium chloride 0.9 % IVPB  Status:  Discontinued        10 mg/kg  74 kg 130 mL/hr over 30 Minutes Intravenous Daily 04/09/22 1340 04/09/22 2134   03/12/22 1500  doxycycline (VIBRA-TABS) tablet 100 mg        100 mg Oral 2 times daily 03/12/22 1412 03/19/22 0852   03/09/22 2000  DAPTOmycin (CUBICIN) 750 mg in sodium chloride 0.9 % IVPB  Status:  Discontinued        10 mg/kg  74 kg 130 mL/hr over 30 Minutes Intravenous Daily 03/09/22 1036 04/09/22 1340   02/28/22 1245  DAPTOmycin (CUBICIN) 600 mg in sodium chloride 0.9 % IVPB  Status:  Discontinued        8 mg/kg  74 kg 124 mL/hr over 30 Minutes Intravenous Daily 02/28/22 1145 03/09/22 1036   02/25/22 1745  ceFAZolin (ANCEF) IVPB 2g/100 mL premix  Status:  Discontinued        2 g 200 mL/hr over 30 Minutes Intravenous Every 8 hours 02/25/22 1734 03/09/22 1036       Medications: Scheduled Meds:  enoxaparin (LOVENOX) injection  40 mg Subcutaneous Q24H   escitalopram  10 mg Oral Daily   feeding supplement  237 mL Oral BID BM   methadone  135 mg Oral Daily   multivitamin with minerals  1 tablet Oral Daily    nicotine  14 mg Transdermal Daily   traZODone  50 mg Oral QHS   Continuous Infusions:  DAPTOmycin (CUBICIN) 500 mg in sodium chloride 0.9 % IVPB 500 mg (04/10/22 1457)   sodium chloride     PRN Meds:.acetaminophen, ALPRAZolam, alum & mag hydroxide-simeth, bisacodyl, diclofenac Sodium, docusate sodium, hydrOXYzine, ibuprofen, loperamide, methocarbamol, polyethylene glycol, promethazine, sodium chloride, sodium chloride flush    Objective: Weight change:   Intake/Output Summary (Last 24 hours) at 04/11/2022 1414 Last data filed at 04/11/2022 0905 Gross per 24 hour  Intake 480 ml  Output --  Net 480 ml     Blood pressure 112/66, pulse 88, temperature 98.3 F (36.8 C), temperature source Oral, resp. rate 19, height 5\' 10"  (1.778 m), weight 63.6 kg, SpO2 96 %. Temp:  [97.6 F (36.4 C)-100.1 F (37.8 C)] 98.3 F (36.8 C) (09/07 1212) Pulse Rate:  [82-115] 88 (09/07 0912) Resp:  [18-20] 19 (09/07 1212) BP: (97-112)/(56-67) 112/66 (09/07 1212) SpO2:  [96 %-98 %] 96 % (09/07 0912)  Physical Exam: Physical Exam Constitutional:      Appearance: He is well-developed.  HENT:     Head: Normocephalic and atraumatic.  Eyes:     Conjunctiva/sclera: Conjunctivae normal.  Cardiovascular:     Rate and Rhythm: Regular rhythm. Tachycardia present.     Heart sounds: Murmur heard.  Pulmonary:     Effort: Pulmonary effort is normal. No respiratory distress.     Breath sounds: Normal breath sounds. No stridor. No wheezing or rhonchi.  Abdominal:     General: There is no distension.     Palpations: Abdomen is soft.  Musculoskeletal:        General: Normal range of motion.     Cervical back: Normal range of motion and neck supple.  Skin:    General: Skin is warm and dry.     Findings: No erythema or rash.  Neurological:     General: No focal deficit present.     Mental Status: He is alert and oriented to person, place, and time.  Psychiatric:        Mood and Affect: Mood normal.         Behavior: Behavior normal.        Thought Content: Thought content normal.        Judgment: Judgment normal.      CBC:    BMET Recent Labs    04/10/22 0802 04/11/22 0939  NA 133* 135  K 4.5 3.7  CL 101 101  CO2 25 25  GLUCOSE 103* 137*  BUN 18 10  CREATININE 0.81 0.87  CALCIUM 8.8* 9.0      Liver Panel  Recent Labs    04/10/22 0802 04/11/22 0939  PROT 6.5 7.2  ALBUMIN 2.7* 3.0*  AST 40 28  ALT 83* 68*  ALKPHOS 70 70  BILITOT 0.4 0.8        Sedimentation Rate No results for input(s): "ESRSEDRATE" in the last 72 hours. C-Reactive Protein No results for input(s): "CRP" in the last 72 hours.  Micro Results: Recent Results (from the past 720 hour(s))  Culture, blood (Routine X 2) w Reflex to ID Panel     Status: Abnormal (Preliminary result)   Collection Time: 04/08/22  7:12 PM   Specimen: BLOOD  Result Value Ref Range Status   Specimen Description BLOOD BLOOD LEFT FOREARM  Final   Special Requests   Final    BOTTLES DRAWN AEROBIC AND ANAEROBIC Blood Culture results may not be optimal due to an inadequate volume of blood received in culture bottles ARPEDB USED AS AEROBIC BOTTLE   Culture  Setup Time   Final    GRAM POSITIVE COCCI IN CLUSTERS IN BOTH AEROBIC AND ANAEROBIC BOTTLES CRITICAL RESULT CALLED TO, READ BACK BY AND VERIFIED WITH: Bill Salinas [id number] @1728  FH Performed at [facility], [address], [place], [place] [address]    Culture STAPHYLOCOCCUS AUREUS (A)  Final   Report Status PENDING  Incomplete  Culture, blood (Routine X 2) w Reflex to ID Panel     Status: Abnormal (Preliminary result)   Collection Time: 04/08/22  7:12 PM   Specimen: BLOOD  Result Value Ref Range Status   Specimen Description BLOOD BLOOD LEFT FOREARM  Final   Special Requests   Final    BOTTLES DRAWN AEROBIC AND ANAEROBIC Blood Culture results may not be optimal due to an inadequate volume of blood received in culture bottles ARPEDB USED AS AEROBIC    Culture  Setup Time   Final    GRAM POSITIVE COCCI IN CLUSTERS IN BOTH AEROBIC AND  ANAEROBIC BOTTLES CRITICAL RESULT CALLED TO, READ BACK BY AND VERIFIED WITH: PHARMD MADELEINE MITCHELL ON 04/09/22 @ 1625 BY DRT    Culture (A)  Final    STAPHYLOCOCCUS AUREUS CONFIRMATION OF SUSCEPTIBILITIES IN PROGRESS Performed at [facility], [address] [place], [place] [address]    Report Status PENDING  Incomplete  Blood Culture ID Panel (Reflexed)     Status: Abnormal   Collection Time: 04/08/22  7:12 PM  Result Value Ref Range Status   Enterococcus faecalis NOT DETECTED NOT DETECTED Final   Enterococcus Faecium NOT DETECTED NOT DETECTED Final   Listeria monocytogenes NOT DETECTED NOT DETECTED Final   Staphylococcus species DETECTED (A) NOT DETECTED Final    Comment: CRITICAL RESULT CALLED TO, READ BACK BY AND VERIFIED WITH: PHARMD MADELEINE MITCHELL ON 04/09/22 @ 1625 BY DRT    Staphylococcus aureus (BCID) DETECTED (A) NOT DETECTED Final    Comment: CRITICAL RESULT CALLED TO, READ BACK BY AND VERIFIED WITH: PHARMD MADELEINE MITCHELL ON 04/09/22 @ 1625 BY DRT    Staphylococcus epidermidis NOT DETECTED NOT DETECTED Final   Staphylococcus lugdunensis NOT DETECTED NOT DETECTED Final   Streptococcus species NOT DETECTED NOT DETECTED Final   Streptococcus agalactiae NOT DETECTED NOT DETECTED Final   Streptococcus pneumoniae NOT DETECTED NOT DETECTED Final   Streptococcus pyogenes NOT DETECTED NOT DETECTED Final   A.calcoaceticus-baumannii NOT DETECTED NOT DETECTED Final   Bacteroides fragilis NOT DETECTED NOT DETECTED Final   Enterobacterales NOT DETECTED NOT DETECTED Final   Enterobacter cloacae complex NOT DETECTED NOT DETECTED Final   Escherichia coli NOT DETECTED NOT DETECTED Final   Klebsiella aerogenes NOT DETECTED NOT DETECTED Final   Klebsiella oxytoca NOT DETECTED NOT DETECTED Final   Klebsiella pneumoniae NOT DETECTED NOT DETECTED Final   Proteus species NOT DETECTED NOT  DETECTED Final   Salmonella species NOT DETECTED NOT DETECTED Final   Serratia marcescens NOT DETECTED NOT DETECTED Final   Haemophilus influenzae NOT DETECTED NOT DETECTED Final   Neisseria meningitidis NOT DETECTED NOT DETECTED Final   Pseudomonas aeruginosa NOT DETECTED NOT DETECTED Final   Stenotrophomonas maltophilia NOT DETECTED NOT DETECTED Final   Candida albicans NOT DETECTED NOT DETECTED Final   Candida auris NOT DETECTED NOT DETECTED Final   Candida glabrata NOT DETECTED NOT DETECTED Final   Candida krusei NOT DETECTED NOT DETECTED Final   Candida parapsilosis NOT DETECTED NOT DETECTED Final   Candida tropicalis NOT DETECTED NOT DETECTED Final   Cryptococcus neoformans/gattii NOT DETECTED NOT DETECTED Final   Meth resistant mecA/C and MREJ NOT DETECTED NOT DETECTED Final    Comment: Performed at [facility], [address], [place], [place] [address]  SARS Coronavirus 2 by RT PCR (hospital order, performed in [facility] hospital lab) *cepheid single result test* Anterior Nasal Swab     Status: None   Collection Time: 04/08/22  8:52 PM   Specimen: Anterior Nasal Swab  Result Value Ref Range Status   SARS Coronavirus 2 by RT PCR NEGATIVE NEGATIVE Final    Comment: (NOTE) SARS-CoV-2 target nucleic acids are NOT DETECTED.  The SARS-CoV-2 RNA is generally detectable in upper and lower respiratory specimens during the acute phase of infection. The lowest concentration of SARS-CoV-2 viral copies this assay can detect is 250 copies / mL. A negative result does not preclude SARS-CoV-2 infection and should not be used as the sole basis for treatment or other patient management decisions.  A negative result may occur with improper specimen collection /  handling, submission of specimen other than nasopharyngeal swab, presence of viral mutation(s) within the areas targeted by this assay, and inadequate number of viral copies (<250 copies / mL). A negative result must be  combined with clinical observations, patient history, and epidemiological information.  Fact Sheet for Patients:   RoadLapTop.co.za  Fact Sheet for Healthcare Providers: http://kim-miller.com/  This test is not yet approved or  cleared by the [place] FDA and has been authorized for detection and/or diagnosis of SARS-CoV-2 by FDA under an Emergency Use Authorization (EUA).  This EUA will remain in effect (meaning this test can be used) for the duration of the COVID-19 declaration under Section 564(b)(1) of the Act, 21 U.S.C. section 360bbb-3(b)(1), unless the authorization is terminated or revoked sooner.  Performed at [facility], [address] [place], [place] [address]     Studies/Results: [place] [facility]  Result Date: 04/11/2022 CLINICAL DATA:  Predental surgery and cardiac surgery evaluation, dental caries EXAM: ORTHOPANTOGRAM/PANORAMIC COMPARISON:  01/08/2022 FINDINGS: Numerous prior dental extractions. Numerous dental caries again identified throughout remaining teeth. Persistent periodontal lucencies at multiple residual mandibular teeth bilaterally, including a LEFT mandibular periapical lucency. No other definite areas of bone destruction. IMPRESSION: Multiple prior dental extractions with dental caries throughout remaining teeth. Persistent periodontal lucencies at multiple mandibular teeth with persistent periapical lucency versus abscess at a LEFT mandibular molar. Electronically Signed   By: Ulyses Southward M.D.   On: 04/11/2022 12:09      Assessment/Plan:  INTERVAL HISTORY: Patient tolerated daptomycin when given through new IV    Principal Problem:   Sepsis ([name]) Active Problems:   MRSA bacteremia   Chronic pain syndrome/methadone abuse    Tobacco abuse   Polysubstance abuse ([name])   Hepatitis C, chronic ([name])   Anxiety   Normocytic anemia   Prolonged QT interval   Elevated serum creatinine    Hypokalemia   Hypomagnesemia   Hyponatremia   Subacute bacterial endocarditis   MSSA bacteremia   IVDU (intravenous drug user)   Pneumonia of both lower lobes due to methicillin resistant Staphylococcus aureus (MRSA) ([name])    Reginald Clarke is a 31 y.o. male with history of IV drug use MSSA bacteremia endocarditis status post angio back in April 2023 with residual subvalvular vegetation not amenable to repeat angio VAC.  History of cefazolin followed by dalbavancin.  He had persistent MSSA bacteremia in May 2023 with thoracic spine discitis sp 6 weeks of cefazolin followed by cefdinir then MRSA bacteremia in context of PICC line with  TV endocarditis, currently on daptomycin to cover for possibility of MSSA and MRSA tricuspid valve endocarditis.  He was on course to complete therapy but became acutely febrile and diaphoretic.  Blood cultures were taken and a repeat 2D echocardiogram was performed This is shown 1.2.4's mobile mass on the septal leaflet of the valve consistent with previous to vegetation as well as a mass in the anterior leaflet that is 1 x 1.65 cm.  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  This is undoubtedly due to his tricuspid valve endocarditis and need for valve replacement with ongoing bacteremia in the face of appropriate antimicrobial therapy.  We did discontinue his PICC line as it is undoubtably seeded with Staphylococcus aureus as well.  Had infiltration of 1 peripheral IV that was interpreted as being an  adverse allergic reaction to daptomycin but which turned out to be infiltration alone.  He is tolerating daptomycin again.  Dental evaluation prior to cardiothoracic surgery is pending Dr. Kipp Brood is planning on valve replacement  Chronic hepatitis C without hepatic  coma:  I have counseled the patient regarding treatment which will initiate in the outpatient setting  I spent 53 minutes with the patient including than 50% of the time in face to face counseling of the patient regarding his tricuspid valve endocarditis palong with review of medical records in preparation for the visit and during the visit and in coordination of his care.      LOS: 45 days   Alcide Evener 04/11/2022, 2:14 PM

## 2022-04-11 NOTE — Progress Notes (Signed)
PROGRESS NOTE        PATIENT DETAILS Name: Reginald Clarke Age: 31 y.o. Sex: male Date of Birth: October 12, 1990 Admit Date: 02/25/2022 Admitting Physician Dorcas Carrow, MD KCL:[id number], [name], PA-C  Brief Summary:  Patient is a 31 y.o.  male with history of recurrent MSSA native tricuspid valve endocarditis  Significant events: 4/12-5/10>> hospitalization for MSSA bacteremia with native tricuspid valve endocarditis.  Underwent angio vac procedure on 4/26.Completed cefazolin on 5/10-planned weekly dalbavancin at the ID clinic.   5/22>> outpatient blood culture positive for MSSA 5/24-6/8>> admit to [facility] for persistent bacteremia-worsening back pain-discharge to [facility] on IV Ancef. 7/5>> discharged from [facility] on oral cefdinir.  Continued to have intermittent fever postdischarge.   7/24>> admit for-persistent fever/nausea/vomiting/diarrhea-blood cultures positive for MSSA. 9/5>> supposed to finish IV antibiotic treatment 9/6, but unfortunately he spiked high fever to 103, where his blood culture growing Staph aureus  Significant studies: 7/25>> CT chest: Multifocal airspace opacities-increasing cavitary nodules 7/25>> MRI thoracic spine: No evidence of infection in thoracic spine.  Significant microbiology data: 7/24>> urine culture: Negative 7/25>> blood culture: 1/2-MSSA. 7/27>> blood culture: No growth 7/29>> blood culture: No growth 9/5>> blood cultures Staph aureus 9/5 >> PICC line discontinued  Procedures:  8/3>> TEE: 3 vegetations on tricuspid valve-severe TR.  EF 60-65%  Consults: ID CT suregery Dentist   Subjective:  -Did not report fever overnight, having chills, he denies any chest pain or shortness of breath.      Objective: Vitals: Blood pressure 112/66, pulse 88, temperature 98.3 F (36.8 C), temperature source Oral, resp. rate 19, height 5\' 10"  (1.778 m), weight 63.6 kg, SpO2 96 %.   Exam:  Awake Alert, Oriented X 3, No new  F.N deficits, Normal affect Poor dentition Symmetrical Chest wall movement, Good air movement bilaterally, CTAB RRR,No Gallops,Rubs or new Murmurs, No Parasternal Heave +ve B.Sounds, Abd Soft, No tenderness, No rebound - guarding or rigidity. No Cyanosis, Clubbing or edema, No new Rash or bruise         Assessment/Plan:  Sepsis due to recurrent MSSA bacteremia with native tricuspid valve endocarditis: - treated with  6 weeks of IV antibiotics, with plan stop date 9/6 . -Fortunately he spiked another fever, repeat blood culture showing Staphylococcus aureus, he remains currently on daptomycin to cover for MSSA and MRSA for tricuspid valve endocarditis. -CT surgery consulted, likely will need TV surgery. -Dentist consulted 9/6. -Follow on repeated blood cultures obtained 9/7  Normocytic anemia: Due to chronic disease/recurrent endocarditis: No indication for transfusion.  Anxiety: Stable-on Lexapro-Xanax be slowly weaned down.  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  Mild transaminitis.  Asymptomatic.  Does have chronic hepatitis C but LFTs have risen up in the last few days, asymptomatic, ultrasound stable.  Likely due to daptomycin.  ID monitoring.   Nutrition Status: Nutrition Problem: Inadequate oral intake Etiology: vomiting, nausea Signs/Symptoms: per patient/family report Interventions: Ensure Enlive (each supplement provides 350kcal and 20 grams of protein), MVI, Snacks  BMI: Estimated body mass index is 20.12 kg/m as calculated from the following:   Height as of this encounter: 5\' 10"  (1.778 m).   Weight as of this encounter: 63.6 kg.   Code status:   Code Status: Full Code   DVT Prophylaxis: enoxaparin (LOVENOX) injection 40 mg Start: 02/25/22 1700   Family  Communication: None at bedside   Disposition Plan: Status is: Inpatient    Diet: Diet Order             Diet regular Room  service appropriate? Yes; Fluid consistency: Thin  Diet effective now                    MEDICATIONS: Scheduled Meds:  enoxaparin (LOVENOX) injection  40 mg Subcutaneous Q24H   escitalopram  10 mg Oral Daily   feeding supplement  237 mL Oral BID BM   methadone  135 mg Oral Daily   multivitamin with minerals  1 tablet Oral Daily   nicotine  14 mg Transdermal Daily   traZODone  50 mg Oral QHS   Continuous Infusions:  DAPTOmycin (CUBICIN) 500 mg in sodium chloride 0.9 % IVPB 500 mg (04/10/22 1457)   sodium chloride     PRN Meds:.acetaminophen, ALPRAZolam, alum & mag hydroxide-simeth, bisacodyl, diclofenac Sodium, docusate sodium, hydrOXYzine, ibuprofen, loperamide, methocarbamol, polyethylene glycol, promethazine, sodium chloride, sodium chloride flush   I have personally reviewed following labs and imaging studies  LABORATORY DATA: Recent Labs  Lab 04/08/22 0430 04/09/22 0343 04/10/22 0802 04/11/22 0939  WBC 6.7 8.2 5.2 8.2  HGB 9.0* 9.6* 8.6* 9.1*  HCT 28.1* 30.9* 26.7* 28.0*  PLT 109* 98* 83* 100*  MCV 87.3 87.8 85.0 85.1  MCH 28.0 27.3 27.4 27.7  MCHC 32.0 31.1 32.2 32.5  RDW 17.9* 17.8* 17.5* 17.2*  LYMPHSABS 1.3 1.5 1.0 1.4  MONOABS 0.6 0.8 0.7 0.7  EOSABS 0.3 0.1 0.1 0.1  BASOSABS 0.0 0.0 0.0 0.0    Recent Labs  Lab 04/06/22 0449 04/08/22 0430 04/08/22 1848 04/09/22 0343 04/10/22 0802 04/11/22 0939  NA 138 135 135 138 133* 135  K 4.2 4.2 4.1 4.8 4.5 3.7  CL 103 101 100 103 101 101  CO2 31 29 26 26 25 25   GLUCOSE 97 101* 93 117* 103* 137*  BUN 17 21* 25* 24* 18 10  CREATININE 0.81 0.66 0.68 0.72 0.81 0.87  CALCIUM 9.2 9.1 9.1 9.5 8.8* 9.0  AST 76* 54* 55* 44* 40 28  ALT 128* 108* 103* 95* 83* 68*  ALKPHOS 86 75 86 72 70 70  BILITOT 0.4 0.6 0.3 0.3 0.4 0.8  ALBUMIN 3.1* 3.0* 3.0* 3.0* 2.7* 3.0*  MG  --  1.8  --  2.0 1.7 2.0  PROCALCITON  --   --  <0.10 0.19 <0.10  --   INR 1.1  --   --   --   --   --     MICROBIOLOGY: Recent Results (from  the past 240 hour(s))  Culture, blood (Routine X 2) w Reflex to ID Panel     Status: Abnormal (Preliminary result)   Collection Time: 04/08/22  7:12 PM   Specimen: BLOOD  Result Value Ref Range Status   Specimen Description BLOOD BLOOD LEFT FOREARM  Final   Special Requests   Final    BOTTLES DRAWN AEROBIC AND ANAEROBIC Blood Culture results may not be optimal due to an inadequate volume of blood received in culture bottles ARPEDB USED AS AEROBIC BOTTLE   Culture  Setup Time   Final    GRAM POSITIVE COCCI IN CLUSTERS IN BOTH AEROBIC AND ANAEROBIC BOTTLES CRITICAL RESULT CALLED TO, READ BACK BY AND VERIFIED WITH: 06/08/22 Chana Bode @1728  FH Performed at [facility], [address] [place], [address] [place]    Culture STAPHYLOCOCCUS AUREUS (A)  Final  Report Status PENDING  Incomplete  Culture, blood (Routine X 2) w Reflex to ID Panel     Status: Abnormal (Preliminary result)   Collection Time: 04/08/22  7:12 PM   Specimen: BLOOD  Result Value Ref Range Status   Specimen Description BLOOD BLOOD LEFT FOREARM  Final   Special Requests   Final    BOTTLES DRAWN AEROBIC AND ANAEROBIC Blood Culture results may not be optimal due to an inadequate volume of blood received in culture bottles ARPEDB USED AS AEROBIC   Culture  Setup Time   Final    GRAM POSITIVE COCCI IN CLUSTERS IN BOTH AEROBIC AND ANAEROBIC BOTTLES CRITICAL RESULT CALLED TO, READ BACK BY AND VERIFIED WITH: PHARMD MADELEINE MITCHELL ON 04/09/22 @ 1625 BY DRT    Culture (A)  Final    STAPHYLOCOCCUS AUREUS CONFIRMATION OF SUSCEPTIBILITIES IN PROGRESS Performed at [facility], [address] [place], [place] [address]    Report Status PENDING  Incomplete  Blood Culture ID Panel (Reflexed)     Status: Abnormal   Collection Time: 04/08/22  7:12 PM  Result Value Ref Range Status   Enterococcus faecalis NOT DETECTED NOT DETECTED Final   Enterococcus Faecium NOT DETECTED NOT DETECTED Final   Listeria  monocytogenes NOT DETECTED NOT DETECTED Final   Staphylococcus species DETECTED (A) NOT DETECTED Final    Comment: CRITICAL RESULT CALLED TO, READ BACK BY AND VERIFIED WITH: PHARMD MADELEINE MITCHELL ON 04/09/22 @ 1625 BY DRT    Staphylococcus aureus (BCID) DETECTED (A) NOT DETECTED Final    Comment: CRITICAL RESULT CALLED TO, READ BACK BY AND VERIFIED WITH: PHARMD MADELEINE MITCHELL ON 04/09/22 @ 1625 BY DRT    Staphylococcus epidermidis NOT DETECTED NOT DETECTED Final   Staphylococcus lugdunensis NOT DETECTED NOT DETECTED Final   Streptococcus species NOT DETECTED NOT DETECTED Final   Streptococcus agalactiae NOT DETECTED NOT DETECTED Final   Streptococcus pneumoniae NOT DETECTED NOT DETECTED Final   Streptococcus pyogenes NOT DETECTED NOT DETECTED Final   A.calcoaceticus-baumannii NOT DETECTED NOT DETECTED Final   Bacteroides fragilis NOT DETECTED NOT DETECTED Final   Enterobacterales NOT DETECTED NOT DETECTED Final   Enterobacter cloacae complex NOT DETECTED NOT DETECTED Final   Escherichia coli NOT DETECTED NOT DETECTED Final   Klebsiella aerogenes NOT DETECTED NOT DETECTED Final   Klebsiella oxytoca NOT DETECTED NOT DETECTED Final   Klebsiella pneumoniae NOT DETECTED NOT DETECTED Final   Proteus species NOT DETECTED NOT DETECTED Final   Salmonella species NOT DETECTED NOT DETECTED Final   Serratia marcescens NOT DETECTED NOT DETECTED Final   Haemophilus influenzae NOT DETECTED NOT DETECTED Final   Neisseria meningitidis NOT DETECTED NOT DETECTED Final   Pseudomonas aeruginosa NOT DETECTED NOT DETECTED Final   Stenotrophomonas maltophilia NOT DETECTED NOT DETECTED Final   Candida albicans NOT DETECTED NOT DETECTED Final   Candida auris NOT DETECTED NOT DETECTED Final   Candida glabrata NOT DETECTED NOT DETECTED Final   Candida krusei NOT DETECTED NOT DETECTED Final   Candida parapsilosis NOT DETECTED NOT DETECTED Final   Candida tropicalis NOT DETECTED NOT DETECTED Final    Cryptococcus neoformans/gattii NOT DETECTED NOT DETECTED Final   Meth resistant mecA/C and MREJ NOT DETECTED NOT DETECTED Final    Comment: Performed at [facility], [address] [place], [place] [address]  SARS Coronavirus 2 by RT PCR (hospital order, performed in [facility] hospital lab) *cepheid single result test* Anterior Nasal Swab     Status: None  Collection Time: 04/08/22  8:52 PM   Specimen: Anterior Nasal Swab  Result Value Ref Range Status   SARS Coronavirus 2 by RT PCR NEGATIVE NEGATIVE Final    Comment: (NOTE) SARS-CoV-2 target nucleic acids are NOT DETECTED.  The SARS-CoV-2 RNA is generally detectable in upper and lower respiratory specimens during the acute phase of infection. The lowest concentration of SARS-CoV-2 viral copies this assay can detect is 250 copies / mL. A negative result does not preclude SARS-CoV-2 infection and should not be used as the sole basis for treatment or other patient management decisions.  A negative result may occur with improper specimen collection / handling, submission of specimen other than nasopharyngeal swab, presence of viral mutation(s) within the areas targeted by this assay, and inadequate number of viral copies (<250 copies / mL). A negative result must be combined with clinical observations, patient history, and epidemiological information.  Fact Sheet for Patients:   RoadLapTop.co.za  Fact Sheet for Healthcare Providers: http://kim-miller.com/  This test is not yet approved or  cleared by the [place] FDA and has been authorized for detection and/or diagnosis of SARS-CoV-2 by FDA under an Emergency Use Authorization (EUA).  This EUA will remain in effect (meaning this test can be used) for the duration of the COVID-19 declaration under Section 564(b)(1) of the Act, 21 U.S.C. section 360bbb-3(b)(1), unless the authorization is terminated or revoked  sooner.  Performed at [facility], [address], [place], [place] [address]     RADIOLOGY STUDIES/RESULTS: DG Orthopantogram  Result Date: 04/11/2022 CLINICAL DATA:  Predental surgery and cardiac surgery evaluation, dental caries EXAM: ORTHOPANTOGRAM/PANORAMIC COMPARISON:  01/08/2022 FINDINGS: Numerous prior dental extractions. Numerous dental caries again identified throughout remaining teeth. Persistent periodontal lucencies at multiple residual mandibular teeth bilaterally, including a LEFT mandibular periapical lucency. No other definite areas of bone destruction. IMPRESSION: Multiple prior dental extractions with dental caries throughout remaining teeth. Persistent periodontal lucencies at multiple mandibular teeth with persistent periapical lucency versus abscess at a LEFT mandibular molar. Electronically Signed   By: Ulyses Southward M.D.   On: 04/11/2022 12:09     LOS: 45 days   Signature  Huey Bienenstock M.D on 04/11/2022 at 2:09 PM   -  To page go to [email or web address]

## 2022-04-12 DIAGNOSIS — B182 Chronic viral hepatitis C: Secondary | ICD-10-CM | POA: Diagnosis not present

## 2022-04-12 DIAGNOSIS — R7881 Bacteremia: Secondary | ICD-10-CM | POA: Diagnosis not present

## 2022-04-12 DIAGNOSIS — I079 Rheumatic tricuspid valve disease, unspecified: Secondary | ICD-10-CM | POA: Diagnosis not present

## 2022-04-12 DIAGNOSIS — F1990 Other psychoactive substance use, unspecified, uncomplicated: Secondary | ICD-10-CM | POA: Diagnosis not present

## 2022-04-12 DIAGNOSIS — F1910 Other psychoactive substance abuse, uncomplicated: Secondary | ICD-10-CM | POA: Diagnosis not present

## 2022-04-12 LAB — CBC WITH DIFFERENTIAL/PLATELET
Abs Immature Granulocytes: 0.02 10*3/uL (ref 0.00–0.07)
Basophils Absolute: 0 10*3/uL (ref 0.0–0.1)
Basophils Relative: 0 %
Eosinophils Absolute: 0.1 10*3/uL (ref 0.0–0.5)
Eosinophils Relative: 1 %
HCT: 25.6 % — ABNORMAL LOW (ref 39.0–52.0)
Hemoglobin: 8.3 g/dL — ABNORMAL LOW (ref 13.0–17.0)
Immature Granulocytes: 0 %
Lymphocytes Relative: 21 %
Lymphs Abs: 1.5 10*3/uL (ref 0.7–4.0)
MCH: 27.2 pg (ref 26.0–34.0)
MCHC: 32.4 g/dL (ref 30.0–36.0)
MCV: 83.9 fL (ref 80.0–100.0)
Monocytes Absolute: 0.9 10*3/uL (ref 0.1–1.0)
Monocytes Relative: 12 %
Neutro Abs: 4.7 10*3/uL (ref 1.7–7.7)
Neutrophils Relative %: 66 %
Platelets: 100 10*3/uL — ABNORMAL LOW (ref 150–400)
RBC: 3.05 MIL/uL — ABNORMAL LOW (ref 4.22–5.81)
RDW: 16.9 % — ABNORMAL HIGH (ref 11.5–15.5)
WBC: 7.2 10*3/uL (ref 4.0–10.5)
nRBC: 0 % (ref 0.0–0.2)

## 2022-04-12 LAB — COMPREHENSIVE METABOLIC PANEL
ALT: 58 U/L — ABNORMAL HIGH (ref 0–44)
AST: 27 U/L (ref 15–41)
Albumin: 2.7 g/dL — ABNORMAL LOW (ref 3.5–5.0)
Alkaline Phosphatase: 66 U/L (ref 38–126)
Anion gap: 10 (ref 5–15)
BUN: 15 mg/dL (ref 6–20)
CO2: 25 mmol/L (ref 22–32)
Calcium: 8.6 mg/dL — ABNORMAL LOW (ref 8.9–10.3)
Chloride: 99 mmol/L (ref 98–111)
Creatinine, Ser: 0.79 mg/dL (ref 0.61–1.24)
GFR, Estimated: >60 mL/min (ref >60)
Glucose, Bld: 99 mg/dL (ref 70–99)
Potassium: 3.6 mmol/L (ref 3.5–5.1)
Sodium: 134 mmol/L — ABNORMAL LOW (ref 135–145)
Total Bilirubin: 0.5 mg/dL (ref 0.3–1.2)
Total Protein: 6.7 g/dL (ref 6.5–8.1)

## 2022-04-12 LAB — MAGNESIUM: Magnesium: 1.9 mg/dL (ref 1.7–2.4)

## 2022-04-12 LAB — CULTURE, BLOOD (ROUTINE X 2)

## 2022-04-12 MED ORDER — ONDANSETRON HCL 4 MG/2ML IJ SOLN: 4.0000 mg | Freq: Once | INTRAMUSCULAR | Status: DC

## 2022-04-12 MED ORDER — SODIUM CHLORIDE 0.9 % IV SOLN
600.0000 mg | Freq: Three times a day (TID) | INTRAVENOUS | Status: DC
  Administered 2022-04-12 – 2022-05-03 (×61): 600 mg via INTRAVENOUS
  Filled 2022-04-12 (×73): qty 20

## 2022-04-12 MED ORDER — SODIUM CHLORIDE 0.9 % IV SOLN
8.0000 mg/kg | Freq: Every day | INTRAVENOUS | Status: DC
  Administered 2022-04-12 – 2022-04-24 (×13): 650 mg via INTRAVENOUS
  Filled 2022-04-12 (×15): qty 13

## 2022-04-12 MED ORDER — SODIUM CHLORIDE 0.9 % IV SOLN
12.5000 mg | Freq: Once | INTRAVENOUS | Status: AC
  Administered 2022-04-12: 12.5 mg via INTRAVENOUS
  Filled 2022-04-12: qty 0.5

## 2022-04-12 NOTE — Progress Notes (Signed)
The patient complained of n/v; he stated he threw up his last dose right after administration, earlier today. He's due for another dose at around 2030. The MD on call was notifed and asked if he could add the ability to give the phenergan IV.  EKG ordered/obtained and phenergan was ordered, then given.  Will continue to monitor patient progress.

## 2022-04-12 NOTE — Progress Notes (Signed)
Daptomycin 500mg  9ml dose wasted due to dose change to 650mg  with 2nd RN Marisa in stericycle.

## 2022-04-12 NOTE — Progress Notes (Signed)
PROGRESS NOTE        PATIENT DETAILS Name: Reginald Clarke Age: 31 y.o. Sex: male Date of Birth: 28-Dec-1990 Admit Date: 02/25/2022 Admitting Physician Dorcas Carrow, MD PRF:[id number], [name], PA-C  Brief Summary:  Patient is a 31 y.o.  male with history of recurrent MSSA native tricuspid valve endocarditis  Significant events: 4/12-5/10>> hospitalization for MSSA bacteremia with native tricuspid valve endocarditis.  Underwent angio vac procedure on 4/26.Completed cefazolin on 5/10-planned weekly dalbavancin at the ID clinic.   5/22>> outpatient blood culture positive for MSSA 5/24-6/8>> admit to [facility] for persistent bacteremia-worsening back pain-discharge to [facility] on IV Ancef. 7/5>> discharged from [facility] on oral cefdinir.  Continued to have intermittent fever postdischarge.   7/24>> admit for-persistent fever/nausea/vomiting/diarrhea-blood cultures positive for MSSA. 9/5>> supposed to finish IV antibiotic treatment 9/6, but unfortunately he spiked high fever to 103, where his blood culture growing Staph aureus  Significant studies: 7/25>> CT chest: Multifocal airspace opacities-increasing cavitary nodules 7/25>> MRI thoracic spine: No evidence of infection in thoracic spine.  Significant microbiology data: 7/24>> urine culture: Negative 7/25>> blood culture: 1/2-MSSA. 7/27>> blood culture: No growth 7/29>> blood culture: No growth 9/5>> blood cultures Staph aureus 9/5 >> PICC line discontinued  Procedures:  8/3>> TEE: 3 vegetations on tricuspid valve-severe TR.  EF 60-65%  Consults: ID CT suregery Dentist   Subjective:  -No significant events overnight, patient denies any complaints.    Objective: Vitals: Blood pressure (!) 98/59, pulse (!) 110, temperature 97.8 F (36.6 C), temperature source Oral, resp. rate 18, height 5\' 10"  (1.778 m), weight 78.2 kg, SpO2 92 %.   Exam:  Awake Alert, Oriented X 3, No new F.N deficits, Normal  affect Poor dentition Symmetrical Chest wall movement, Good air movement bilaterally, CTAB RRR,No Gallops,Rubs , No Parasternal Heave +ve B.Sounds, Abd Soft, No tenderness, No rebound - guarding or rigidity. No Cyanosis, Clubbing or edema, No new Rash or bruise          Assessment/Plan:  Sepsis due to recurrent MSSA bacteremia with native tricuspid valve endocarditis: - treated with  6 weeks of IV antibiotics, with plan stop date 9/6 . -Fortunately he spiked another fever, repeat blood culture showing Staphylococcus aureus, he remains currently on daptomycin to cover for MSSA and MRSA for tricuspid valve endocarditis. -CT surgery consulted, likely will need TV surgery 2 weeks after dental clearance -Dentist consulted 9/6. Pete blood culture obtained after removal of PICC line unfortunately growing MRSA from 2 out of 2 sites  Normocytic anemia: Due to chronic disease/recurrent endocarditis: No indication for transfusion.  Anxiety: Stable-on Lexapro-Xanax be slowly weaned down.  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  Mild transaminitis.  Asymptomatic.  Does have chronic hepatitis C but LFTs have risen up in the last few days, asymptomatic, ultrasound stable.  Likely due to daptomycin.  ID monitoring.   Nutrition Status: Nutrition Problem: Inadequate oral intake Etiology: vomiting, nausea Signs/Symptoms: per patient/family report Interventions: Ensure Enlive (each supplement provides 350kcal and 20 grams of protein), MVI, Snacks  BMI: Estimated body mass index is 24.75 kg/m as calculated from the following:   Height as of this encounter: 5\' 10"  (1.778 m).   Weight as of this encounter: 78.2 kg.   Code status:   Code Status: Full Code   DVT Prophylaxis: enoxaparin (LOVENOX) injection 40  mg Start: 02/25/22 1700   Family Communication: None at bedside   Disposition Plan: Status is:  Inpatient    Diet: Diet Order             Diet regular Room service appropriate? Yes; Fluid consistency: Thin  Diet effective now                    MEDICATIONS: Scheduled Meds:  enoxaparin (LOVENOX) injection  40 mg Subcutaneous Q24H   escitalopram  10 mg Oral Daily   feeding supplement  237 mL Oral BID BM   methadone  135 mg Oral Daily   multivitamin with minerals  1 tablet Oral Daily   nicotine  14 mg Transdermal Daily   traZODone  50 mg Oral QHS   Continuous Infusions:  ceFTAROline (TEFLARO) IV 600 mg (04/12/22 1443)   DAPTOmycin (CUBICIN) 650 mg in sodium chloride 0.9 % IVPB     sodium chloride     PRN Meds:.acetaminophen, ALPRAZolam, alum & mag hydroxide-simeth, bisacodyl, diclofenac Sodium, docusate sodium, hydrOXYzine, ibuprofen, loperamide, methocarbamol, polyethylene glycol, promethazine, sodium chloride, sodium chloride flush   I have personally reviewed following labs and imaging studies  LABORATORY DATA: Recent Labs  Lab 04/08/22 0430 04/09/22 0343 04/10/22 0802 04/11/22 0939 04/12/22 0304  WBC 6.7 8.2 5.2 8.2 7.2  HGB 9.0* 9.6* 8.6* 9.1* 8.3*  HCT 28.1* 30.9* 26.7* 28.0* 25.6*  PLT 109* 98* 83* 100* 100*  MCV 87.3 87.8 85.0 85.1 83.9  MCH 28.0 27.3 27.4 27.7 27.2  MCHC 32.0 31.1 32.2 32.5 32.4  RDW 17.9* 17.8* 17.5* 17.2* 16.9*  LYMPHSABS 1.3 1.5 1.0 1.4 1.5  MONOABS 0.6 0.8 0.7 0.7 0.9  EOSABS 0.3 0.1 0.1 0.1 0.1  BASOSABS 0.0 0.0 0.0 0.0 0.0    Recent Labs  Lab 04/06/22 0449 04/08/22 0430 04/08/22 1848 04/09/22 0343 04/10/22 0802 04/11/22 0939 04/12/22 0304  NA 138 135 135 138 133* 135 134*  K 4.2 4.2 4.1 4.8 4.5 3.7 3.6  CL 103 101 100 103 101 101 99  CO2 31 29 26 26 25 25 25   GLUCOSE 97 101* 93 117* 103* 137* 99  BUN 17 21* 25* 24* 18 10 15   CREATININE 0.81 0.66 0.68 0.72 0.81 0.87 0.79  CALCIUM 9.2 9.1 9.1 9.5 8.8* 9.0 8.6*  AST 76* 54* 55* 44* 40 28 27  ALT 128* 108* 103* 95* 83* 68* 58*  ALKPHOS 86 75 86 72 70 70 66   BILITOT 0.4 0.6 0.3 0.3 0.4 0.8 0.5  ALBUMIN 3.1* 3.0* 3.0* 3.0* 2.7* 3.0* 2.7*  MG  --  1.8  --  2.0 1.7 2.0 1.9  PROCALCITON  --   --  <0.10 0.19 <0.10  --   --   INR 1.1  --   --   --   --   --   --     MICROBIOLOGY: Recent Results (from the past 240 hour(s))  Culture, blood (Routine X 2) w Reflex to ID Panel     Status: Abnormal   Collection Time: 04/08/22  7:12 PM   Specimen: BLOOD  Result Value Ref Range Status   Specimen Description BLOOD BLOOD LEFT FOREARM  Final   Special Requests   Final    BOTTLES DRAWN AEROBIC AND ANAEROBIC Blood Culture results may not be optimal due to an inadequate volume of blood received in culture bottles ARPEDB USED AS AEROBIC BOTTLE   Culture  Setup Time   Final    GRAM  POSITIVE COCCI IN CLUSTERS IN BOTH AEROBIC AND ANAEROBIC BOTTLES CRITICAL RESULT CALLED TO, READ BACK BY AND VERIFIED WITH: PHARMD Isabel Caprice [id number] @1728  FH    Culture (A)  Final    STAPHYLOCOCCUS AUREUS SUSCEPTIBILITIES PERFORMED ON PREVIOUS CULTURE WITHIN THE LAST 5 DAYS. Performed at [facility], [address], [place], [place]    Report Status 04/12/2022 FINAL  Final  Culture, blood (Routine X 2) w Reflex to ID Panel     Status: Abnormal   Collection Time: 04/08/22  7:12 PM   Specimen: BLOOD  Result Value Ref Range Status   Specimen Description BLOOD BLOOD LEFT FOREARM  Final   Special Requests   Final    BOTTLES DRAWN AEROBIC AND ANAEROBIC Blood Culture results may not be optimal due to an inadequate volume of blood received in culture bottles ARPEDB USED AS AEROBIC   Culture  Setup Time   Final    GRAM POSITIVE COCCI IN CLUSTERS IN BOTH AEROBIC AND ANAEROBIC BOTTLES CRITICAL RESULT CALLED TO, READ BACK BY AND VERIFIED WITH: PHARMD MADELEINE MITCHELL ON 04/09/22 @ 1625 BY DRT    Culture (A)  Final    METHICILLIN RESISTANT STAPHYLOCOCCUS AUREUS RETEST CONFIRMED MODIFICATION OF PBP (MECA) Performed at [facility], [address],  [place], [place]    Report Status 04/12/2022 FINAL  Final   Organism ID, Bacteria METHICILLIN RESISTANT STAPHYLOCOCCUS AUREUS  Final      Susceptibility   Methicillin resistant staphylococcus aureus - MIC*    CIPROFLOXACIN <=0.5 SENSITIVE Sensitive     ERYTHROMYCIN >=8 RESISTANT Resistant     GENTAMICIN <=0.5 SENSITIVE Sensitive     OXACILLIN RESISTANT Resistant     TETRACYCLINE <=1 SENSITIVE Sensitive     VANCOMYCIN 2 SENSITIVE Sensitive     TRIMETH/SULFA <=10 SENSITIVE Sensitive     CLINDAMYCIN <=0.25 SENSITIVE Sensitive     RIFAMPIN <=0.5 SENSITIVE Sensitive     Inducible Clindamycin NEGATIVE Sensitive     * METHICILLIN RESISTANT STAPHYLOCOCCUS AUREUS  Blood Culture ID Panel (Reflexed)     Status: Abnormal   Collection Time: 04/08/22  7:12 PM  Result Value Ref Range Status   Enterococcus faecalis NOT DETECTED NOT DETECTED Final   Enterococcus Faecium NOT DETECTED NOT DETECTED Final   Listeria monocytogenes NOT DETECTED NOT DETECTED Final   Staphylococcus species DETECTED (A) NOT DETECTED Final    Comment: CRITICAL RESULT CALLED TO, READ BACK BY AND VERIFIED WITH: PHARMD MADELEINE MITCHELL ON 04/09/22 @ 1625 BY DRT    Staphylococcus aureus (BCID) DETECTED (A) NOT DETECTED Final    Comment: CRITICAL RESULT CALLED TO, READ BACK BY AND VERIFIED WITH: PHARMD MADELEINE MITCHELL ON 04/09/22 @ 1625 BY DRT    Staphylococcus epidermidis NOT DETECTED NOT DETECTED Final   Staphylococcus lugdunensis NOT DETECTED NOT DETECTED Final   Streptococcus species NOT DETECTED NOT DETECTED Final   Streptococcus agalactiae NOT DETECTED NOT DETECTED Final   Streptococcus pneumoniae NOT DETECTED NOT DETECTED Final   Streptococcus pyogenes NOT DETECTED NOT DETECTED Final   A.calcoaceticus-baumannii NOT DETECTED NOT DETECTED Final   Bacteroides fragilis NOT DETECTED NOT DETECTED Final   Enterobacterales NOT DETECTED NOT DETECTED Final   Enterobacter cloacae complex NOT DETECTED NOT DETECTED Final    Escherichia coli NOT DETECTED NOT DETECTED Final   Klebsiella aerogenes NOT DETECTED NOT DETECTED Final   Klebsiella oxytoca NOT DETECTED NOT DETECTED Final   Klebsiella pneumoniae NOT DETECTED NOT DETECTED Final   Proteus species NOT  DETECTED NOT DETECTED Final   Salmonella species NOT DETECTED NOT DETECTED Final   Serratia marcescens NOT DETECTED NOT DETECTED Final   Haemophilus influenzae NOT DETECTED NOT DETECTED Final   Neisseria meningitidis NOT DETECTED NOT DETECTED Final   Pseudomonas aeruginosa NOT DETECTED NOT DETECTED Final   Stenotrophomonas maltophilia NOT DETECTED NOT DETECTED Final   Candida albicans NOT DETECTED NOT DETECTED Final   Candida auris NOT DETECTED NOT DETECTED Final   Candida glabrata NOT DETECTED NOT DETECTED Final   Candida krusei NOT DETECTED NOT DETECTED Final   Candida parapsilosis NOT DETECTED NOT DETECTED Final   Candida tropicalis NOT DETECTED NOT DETECTED Final   Cryptococcus neoformans/gattii NOT DETECTED NOT DETECTED Final   Meth resistant mecA/C and MREJ NOT DETECTED NOT DETECTED Final    Comment: Performed at [facility], [address], [place], [place] [address]  SARS Coronavirus 2 by RT PCR (hospital order, performed in [facility] hospital lab) *cepheid single result test* Anterior Nasal Swab     Status: None   Collection Time: 04/08/22  8:52 PM   Specimen: Anterior Nasal Swab  Result Value Ref Range Status   SARS Coronavirus 2 by RT PCR NEGATIVE NEGATIVE Final    Comment: (NOTE) SARS-CoV-2 target nucleic acids are NOT DETECTED.  The SARS-CoV-2 RNA is generally detectable in upper and lower respiratory specimens during the acute phase of infection. The lowest concentration of SARS-CoV-2 viral copies this assay can detect is 250 copies / mL. A negative result does not preclude SARS-CoV-2 infection and should not be used as the sole basis for treatment or other patient management decisions.  A negative result may occur with improper  specimen collection / handling, submission of specimen other than nasopharyngeal swab, presence of viral mutation(s) within the areas targeted by this assay, and inadequate number of viral copies (<250 copies / mL). A negative result must be combined with clinical observations, patient history, and epidemiological information.  Fact Sheet for Patients:   RoadLapTop.co.za  Fact Sheet for Healthcare Providers: http://kim-miller.com/  This test is not yet approved or  cleared by the [place] FDA and has been authorized for detection and/or diagnosis of SARS-CoV-2 by FDA under an Emergency Use Authorization (EUA).  This EUA will remain in effect (meaning this test can be used) for the duration of the COVID-19 declaration under Section 564(b)(1) of the Act, 21 U.S.C. section 360bbb-3(b)(1), unless the authorization is terminated or revoked sooner.  Performed at [facility], [address], [place], [place] [address]   Culture, blood (Routine X 2) w Reflex to ID Panel     Status: None (Preliminary result)   Collection Time: 04/11/22 10:05 AM   Specimen: BLOOD RIGHT ARM  Result Value Ref Range Status   Specimen Description BLOOD RIGHT ARM  Final   Special Requests   Final    BOTTLES DRAWN AEROBIC AND ANAEROBIC Blood Culture results may not be optimal due to an inadequate volume of blood received in culture bottles   Culture   Final    NO GROWTH < 24 HOURS Performed at [facility], [address] [place], [place] [address]    Report Status PENDING  Incomplete  Culture, blood (Routine X 2) w Reflex to ID Panel     Status: None (Preliminary result)   Collection Time: 04/11/22 10:05 AM   Specimen: BLOOD LEFT ARM  Result Value Ref Range Status   Specimen Description BLOOD LEFT ARM  Final  Special Requests   Final    BOTTLES DRAWN AEROBIC AND ANAEROBIC Blood Culture results may not be optimal due to an inadequate volume of  blood received in culture bottles   Culture   Final    NO GROWTH < 24 HOURS Performed at [facility], [address] [place], [place] [id number]    Report Status PENDING  Incomplete    RADIOLOGY STUDIES/RESULTS: DG Orthopantogram  Result Date: 04/11/2022 CLINICAL DATA:  Predental surgery and cardiac surgery evaluation, dental caries EXAM: ORTHOPANTOGRAM/PANORAMIC COMPARISON:  01/08/2022 FINDINGS: Numerous prior dental extractions. Numerous dental caries again identified throughout remaining teeth. Persistent periodontal lucencies at multiple residual mandibular teeth bilaterally, including a LEFT mandibular periapical lucency. No other definite areas of bone destruction. IMPRESSION: Multiple prior dental extractions with dental caries throughout remaining teeth. Persistent periodontal lucencies at multiple mandibular teeth with persistent periapical lucency versus abscess at a LEFT mandibular molar. Electronically Signed   By: Ulyses SouthwardMark  Boles M.D.   On: 04/11/2022 12:09     LOS: 46 days   Signature  Huey Bienenstockawood Tatelyn Vanhecke M.D on 04/12/2022 at 3:39 PM   -  To page go to [email or web address]

## 2022-04-12 NOTE — Progress Notes (Signed)
Subjective:  No new complaints  Antibiotics:  Anti-infectives (From admission, onward)    Start     Dose/Rate Route Frequency Ordered Stop   04/12/22 2000  DAPTOmycin (CUBICIN) 650 mg in sodium chloride 0.9 % IVPB        8 mg/kg  78.2 kg 126 mL/hr over 30 Minutes Intravenous Daily 04/12/22 1118 05/02/22 1959   04/12/22 1400  ceftaroline (TEFLARO) 600 mg in sodium chloride 0.9 % 100 mL IVPB        600 mg 100 mL/hr over 60 Minutes Intravenous Every 8 hours 04/12/22 0938     04/10/22 2000  DAPTOmycin (CUBICIN) 650 mg in sodium chloride 0.9 % IVPB  Status:  Discontinued        10 mg/kg  63.6 kg (Adjusted) 126 mL/hr over 30 Minutes Intravenous Daily 04/10/22 1053 04/10/22 1104   04/10/22 2000  DAPTOmycin (CUBICIN) 500 mg in sodium chloride 0.9 % IVPB  Status:  Discontinued        8 mg/kg  63.6 kg (Adjusted) 120 mL/hr over 30 Minutes Intravenous Daily 04/10/22 1104 04/10/22 1110   04/10/22 1400  DAPTOmycin (CUBICIN) 500 mg in sodium chloride 0.9 % IVPB  Status:  Discontinued        8 mg/kg  63.6 kg (Adjusted) 120 mL/hr over 30 Minutes Intravenous Daily 04/10/22 1110 04/12/22 1118   04/09/22 2000  DAPTOmycin (CUBICIN) 750 mg in sodium chloride 0.9 % IVPB  Status:  Discontinued        10 mg/kg  74 kg 130 mL/hr over 30 Minutes Intravenous Daily 04/09/22 1340 04/09/22 2134   03/12/22 1500  doxycycline (VIBRA-TABS) tablet 100 mg        100 mg Oral 2 times daily 03/12/22 1412 03/19/22 0852   03/09/22 2000  DAPTOmycin (CUBICIN) 750 mg in sodium chloride 0.9 % IVPB  Status:  Discontinued        10 mg/kg  74 kg 130 mL/hr over 30 Minutes Intravenous Daily 03/09/22 1036 04/09/22 1340   02/28/22 1245  DAPTOmycin (CUBICIN) 600 mg in sodium chloride 0.9 % IVPB  Status:  Discontinued        8 mg/kg  74 kg 124 mL/hr over 30 Minutes Intravenous Daily 02/28/22 1145 03/09/22 1036   02/25/22 1745  ceFAZolin (ANCEF) IVPB 2g/100 mL premix  Status:  Discontinued        2 g 200 mL/hr over  30 Minutes Intravenous Every 8 hours 02/25/22 1734 03/09/22 1036       Medications: Scheduled Meds:  enoxaparin (LOVENOX) injection  40 mg Subcutaneous Q24H   escitalopram  10 mg Oral Daily   feeding supplement  237 mL Oral BID BM   methadone  135 mg Oral Daily   multivitamin with minerals  1 tablet Oral Daily   nicotine  14 mg Transdermal Daily   traZODone  50 mg Oral QHS   Continuous Infusions:  ceFTAROline (TEFLARO) IV     DAPTOmycin (CUBICIN) 650 mg in sodium chloride 0.9 % IVPB     sodium chloride     PRN Meds:.acetaminophen, ALPRAZolam, alum & mag hydroxide-simeth, bisacodyl, diclofenac Sodium, docusate sodium, hydrOXYzine, ibuprofen, loperamide, methocarbamol, polyethylene glycol, promethazine, sodium chloride, sodium chloride flush    Objective: Weight change:   Intake/Output Summary (Last 24 hours) at 04/12/2022 1142 Last data filed at 04/11/2022 1400 Gross per 24 hour  Intake 360 ml  Output --  Net 360 ml     Blood pressure 104/66, pulse Marland Kitchen)  109, temperature 98.4 F (36.9 C), temperature source Oral, resp. rate 18, height 5\' 10"  (1.778 m), weight 78.2 kg, SpO2 99 %. Temp:  [98.3 F (36.8 C)-98.4 F (36.9 C)] 98.4 F (36.9 C) (09/08 0829) Pulse Rate:  [109-118] 109 (09/08 0317) Resp:  [18-19] 18 (09/08 0829) BP: (99-114)/(56-76) 104/66 (09/08 0829) SpO2:  [98 %-99 %] 99 % (09/08 0317) Weight:  [78.2 kg-85.1 kg] 78.2 kg (09/08 1107)  Physical Exam: Physical Exam Constitutional:      Appearance: He is underweight.  HENT:     Head: Normocephalic and atraumatic.     Mouth/Throat:     Dentition: Abnormal dentition. Dental caries present.  Eyes:     Conjunctiva/sclera: Conjunctivae normal.  Cardiovascular:     Rate and Rhythm: Regular rhythm. Tachycardia present.  Pulmonary:     Effort: Pulmonary effort is normal. No respiratory distress.     Breath sounds: Normal breath sounds. No stridor. No wheezing.  Abdominal:     General: There is no distension.      Palpations: Abdomen is soft.  Musculoskeletal:        General: Normal range of motion.     Cervical back: Normal range of motion and neck supple.  Skin:    General: Skin is warm and dry.     Findings: No erythema or rash.  Neurological:     General: No focal deficit present.     Mental Status: He is alert and oriented to person, place, and time.  Psychiatric:        Mood and Affect: Mood normal.        Behavior: Behavior normal.        Thought Content: Thought content normal.        Judgment: Judgment normal.      CBC:    BMET Recent Labs    04/11/22 0939 04/12/22 0304  NA 135 134*  K 3.7 3.6  CL 101 99  CO2 25 25  GLUCOSE 137* 99  BUN 10 15  CREATININE 0.87 0.79  CALCIUM 9.0 8.6*      Liver Panel  Recent Labs    04/11/22 0939 04/12/22 0304  PROT 7.2 6.7  ALBUMIN 3.0* 2.7*  AST 28 27  ALT 68* 58*  ALKPHOS 70 66  BILITOT 0.8 0.5        Sedimentation Rate No results for input(s): "ESRSEDRATE" in the last 72 hours. C-Reactive Protein No results for input(s): "CRP" in the last 72 hours.  Micro Results: Recent Results (from the past 720 hour(s))  Culture, blood (Routine X 2) w Reflex to ID Panel     Status: Abnormal   Collection Time: 04/08/22  7:12 PM   Specimen: BLOOD  Result Value Ref Range Status   Specimen Description BLOOD BLOOD LEFT FOREARM  Final   Special Requests   Final    BOTTLES DRAWN AEROBIC AND ANAEROBIC Blood Culture results may not be optimal due to an inadequate volume of blood received in culture bottles ARPEDB USED AS AEROBIC BOTTLE   Culture  Setup Time   Final    GRAM POSITIVE COCCI IN CLUSTERS IN BOTH AEROBIC AND ANAEROBIC BOTTLES CRITICAL RESULT CALLED TO, READ BACK BY AND VERIFIED WITH: PHARMD 06/08/22 Isabel Caprice @1728  FH    Culture (A)  Final    STAPHYLOCOCCUS AUREUS SUSCEPTIBILITIES PERFORMED ON PREVIOUS CULTURE WITHIN THE LAST 5 DAYS. Performed at [facility], [address] [place], [address] [place]     Report Status 04/12/2022  FINAL  Final  Culture, blood (Routine X 2) w Reflex to ID Panel     Status: Abnormal   Collection Time: 04/08/22  7:12 PM   Specimen: BLOOD  Result Value Ref Range Status   Specimen Description BLOOD BLOOD LEFT FOREARM  Final   Special Requests   Final    BOTTLES DRAWN AEROBIC AND ANAEROBIC Blood Culture results may not be optimal due to an inadequate volume of blood received in culture bottles ARPEDB USED AS AEROBIC   Culture  Setup Time   Final    GRAM POSITIVE COCCI IN CLUSTERS IN BOTH AEROBIC AND ANAEROBIC BOTTLES CRITICAL RESULT CALLED TO, READ BACK BY AND VERIFIED WITH: PHARMD MADELEINE MITCHELL ON 04/09/22 @ 1625 BY DRT    Culture (A)  Final    METHICILLIN RESISTANT STAPHYLOCOCCUS AUREUS RETEST CONFIRMED MODIFICATION OF PBP (MECA) Performed at [facility], [place] [address] [place], [place] [address]    Report Status 04/12/2022 FINAL  Final   Organism ID, Bacteria METHICILLIN RESISTANT STAPHYLOCOCCUS AUREUS  Final      Susceptibility   Methicillin resistant staphylococcus aureus - MIC*    CIPROFLOXACIN <=0.5 SENSITIVE Sensitive     ERYTHROMYCIN >=8 RESISTANT Resistant     GENTAMICIN <=0.5 SENSITIVE Sensitive     OXACILLIN RESISTANT Resistant     TETRACYCLINE <=1 SENSITIVE Sensitive     VANCOMYCIN 2 SENSITIVE Sensitive     TRIMETH/SULFA <=10 SENSITIVE Sensitive     CLINDAMYCIN <=0.25 SENSITIVE Sensitive     RIFAMPIN <=0.5 SENSITIVE Sensitive     Inducible Clindamycin NEGATIVE Sensitive     * METHICILLIN RESISTANT STAPHYLOCOCCUS AUREUS  Blood Culture ID Panel (Reflexed)     Status: Abnormal   Collection Time: 04/08/22  7:12 PM  Result Value Ref Range Status   Enterococcus faecalis NOT DETECTED NOT DETECTED Final   Enterococcus Faecium NOT DETECTED NOT DETECTED Final   Listeria monocytogenes NOT DETECTED NOT DETECTED Final   Staphylococcus species DETECTED (A) NOT DETECTED Final    Comment: CRITICAL RESULT CALLED TO, READ BACK BY AND VERIFIED  WITH: PHARMD MADELEINE MITCHELL ON 04/09/22 @ 1625 BY DRT    Staphylococcus aureus (BCID) DETECTED (A) NOT DETECTED Final    Comment: CRITICAL RESULT CALLED TO, READ BACK BY AND VERIFIED WITH: PHARMD MADELEINE MITCHELL ON 04/09/22 @ 1625 BY DRT    Staphylococcus epidermidis NOT DETECTED NOT DETECTED Final   Staphylococcus lugdunensis NOT DETECTED NOT DETECTED Final   Streptococcus species NOT DETECTED NOT DETECTED Final   Streptococcus agalactiae NOT DETECTED NOT DETECTED Final   Streptococcus pneumoniae NOT DETECTED NOT DETECTED Final   Streptococcus pyogenes NOT DETECTED NOT DETECTED Final   A.calcoaceticus-baumannii NOT DETECTED NOT DETECTED Final   Bacteroides fragilis NOT DETECTED NOT DETECTED Final   Enterobacterales NOT DETECTED NOT DETECTED Final   Enterobacter cloacae complex NOT DETECTED NOT DETECTED Final   Escherichia coli NOT DETECTED NOT DETECTED Final   Klebsiella aerogenes NOT DETECTED NOT DETECTED Final   Klebsiella oxytoca NOT DETECTED NOT DETECTED Final   Klebsiella pneumoniae NOT DETECTED NOT DETECTED Final   Proteus species NOT DETECTED NOT DETECTED Final   Salmonella species NOT DETECTED NOT DETECTED Final   Serratia marcescens NOT DETECTED NOT DETECTED Final   Haemophilus influenzae NOT DETECTED NOT DETECTED Final   Neisseria meningitidis NOT DETECTED NOT DETECTED Final   Pseudomonas aeruginosa NOT DETECTED NOT DETECTED Final   Stenotrophomonas maltophilia NOT DETECTED NOT DETECTED Final   Candida albicans NOT DETECTED NOT DETECTED Final  Candida auris NOT DETECTED NOT DETECTED Final   Candida glabrata NOT DETECTED NOT DETECTED Final   Candida krusei NOT DETECTED NOT DETECTED Final   Candida parapsilosis NOT DETECTED NOT DETECTED Final   Candida tropicalis NOT DETECTED NOT DETECTED Final   Cryptococcus neoformans/gattii NOT DETECTED NOT DETECTED Final   Meth resistant mecA/C and MREJ NOT DETECTED NOT DETECTED Final    Comment: Performed at [facility]  Lab, [address], [place], [place] [address]  SARS Coronavirus 2 by RT PCR (hospital order, performed in [facility] hospital lab) *cepheid single result test* Anterior Nasal Swab     Status: None   Collection Time: 04/08/22  8:52 PM   Specimen: Anterior Nasal Swab  Result Value Ref Range Status   SARS Coronavirus 2 by RT PCR NEGATIVE NEGATIVE Final    Comment: (NOTE) SARS-CoV-2 target nucleic acids are NOT DETECTED.  The SARS-CoV-2 RNA is generally detectable in upper and lower respiratory specimens during the acute phase of infection. The lowest concentration of SARS-CoV-2 viral copies this assay can detect is 250 copies / mL. A negative result does not preclude SARS-CoV-2 infection and should not be used as the sole basis for treatment or other patient management decisions.  A negative result may occur with improper specimen collection / handling, submission of specimen other than nasopharyngeal swab, presence of viral mutation(s) within the areas targeted by this assay, and inadequate number of viral copies (<250 copies / mL). A negative result must be combined with clinical observations, patient history, and epidemiological information.  Fact Sheet for Patients:   RoadLapTop.co.za  Fact Sheet for Healthcare Providers: http://kim-miller.com/  This test is not yet approved or  cleared by the [place] FDA and has been authorized for detection and/or diagnosis of SARS-CoV-2 by FDA under an Emergency Use Authorization (EUA).  This EUA will remain in effect (meaning this test can be used) for the duration of the COVID-19 declaration under Section 564(b)(1) of the Act, 21 U.S.C. section 360bbb-3(b)(1), unless the authorization is terminated or revoked sooner.  Performed at [facility], [address] [place], [place] [address]   Culture, blood (Routine X 2) w Reflex to ID Panel     Status: None (Preliminary result)    Collection Time: 04/11/22 10:05 AM   Specimen: BLOOD RIGHT ARM  Result Value Ref Range Status   Specimen Description BLOOD RIGHT ARM  Final   Special Requests   Final    BOTTLES DRAWN AEROBIC AND ANAEROBIC Blood Culture results may not be optimal due to an inadequate volume of blood received in culture bottles   Culture   Final    NO GROWTH < 24 HOURS Performed at [facility], [address], [place], [place] [address]    Report Status PENDING  Incomplete  Culture, blood (Routine X 2) w Reflex to ID Panel     Status: None (Preliminary result)   Collection Time: 04/11/22 10:05 AM   Specimen: BLOOD LEFT ARM  Result Value Ref Range Status   Specimen Description BLOOD LEFT ARM  Final   Special Requests   Final    BOTTLES DRAWN AEROBIC AND ANAEROBIC Blood Culture results may not be optimal due to an inadequate volume of blood received in culture bottles   Culture   Final    NO GROWTH < 24 HOURS Performed at [facility], [address] [place], [place] [address]    Report Status PENDING  Incomplete  Studies/Results: DG Orthopantogram  Result Date: 04/11/2022 CLINICAL DATA:  Predental surgery and cardiac surgery evaluation, dental caries EXAM: ORTHOPANTOGRAM/PANORAMIC COMPARISON:  01/08/2022 FINDINGS: Numerous prior dental extractions. Numerous dental caries again identified throughout remaining teeth. Persistent periodontal lucencies at multiple residual mandibular teeth bilaterally, including a LEFT mandibular periapical lucency. No other definite areas of bone destruction. IMPRESSION: Multiple prior dental extractions with dental caries throughout remaining teeth. Persistent periodontal lucencies at multiple mandibular teeth with persistent periapical lucency versus abscess at a LEFT mandibular molar. Electronically Signed   By: Lavonia Dana M.D.   On: 04/11/2022 12:09      Assessment/Plan:  INTERVAL HISTORY:   Repeat cultures after PICC line removal now growing MRSA in  2 out of 2 sites    Principal Problem:   Endocarditis of tricuspid valve Active Problems:   MRSA bacteremia   Chronic pain syndrome/methadone abuse    Tobacco abuse   Polysubstance abuse ([name])   Hepatitis C, chronic ([name])   Anxiety   Normocytic anemia   Prolonged QT interval   Sepsis ([name])   Elevated serum creatinine   Hypokalemia   Hypomagnesemia   Hyponatremia   Subacute bacterial endocarditis   MSSA bacteremia   IVDU (intravenous drug user)   Pneumonia of both lower lobes due to methicillin resistant Staphylococcus aureus (MRSA) ([place])   Thrombocytopenia ([place])    Reginald Clarke is a 31 y.o. male with history of IV drug use MSSA bacteremia endocarditis status post angio back in April 2023 with residual subvalvular vegetation not amenable to repeat angio VAC.  History of cefazolin followed by dalbavancin.  He had persistent MSSA bacteremia in May 2023 with thoracic spine discitis sp 6 weeks of cefazolin followed by cefdinir then MRSA bacteremia in context of PICC line with  TV endocarditis on daptomycin to cover for possibility of MSSA and MRSA tricuspid valve endocarditis.  He was on course to complete therapy but became acutely febrile and diaphoretic.  Blood cultures were taken and a repeat 2D echocardiogram was performed This is shown 1.2.4's mobile mass on the septal leaflet of the valve consistent with previous to vegetation as well as a mass in the anterior leaflet that is 1 x 1.65 cm.  The possibility was not well visualized his tricuspid valve regurgitation is now moderate with mild to moderate tricuspid stenosis  He grew MSSA from blood cultures from 2 peripheral sites despite being on effective antibiotics.  This is undoubtedly due to his tricuspid valve endocarditis and need for valve replacement with ongoing bacteremia in the face of appropriate antimicrobial therapy.  We did discontinue his PICC line as it is undoubtably seeded with Staphylococcus aureus as  well.  Pete blood cultures after removal of PICC line are unfortunately growing MRSA from 2 of 2 sites  In case he now has MRSA resistant to daptomycin we will add Teflaro to his daptomycin  We are having the microbiology lab send out the MRSA for susceptibility testing to daptomycin and Teflaro.  I will order repeat blood cultures tomorrow.  Dr. Kipp Brood is planning on valve replacement  Terrible dentition: Likely needs the majority of his teeth removed by dental surgeon to valve replacement   Chronic hepatitis C without hepatic coma:  I have counseled the patient regarding treatment which will initiate in the outpatient setting  I spent 52 minutes with the patient including than 50% of the time in face to face counseling of the patient guarding his tricuspid valve endocarditis with MSSA and MRSA, along with  review of medical records in preparation for the visit and during the visit and in coordination of his care.   I am on-call this weekend and we will follow-up his culture data and I am available for questions.  A new team is taking over on Monday.   LOS: 46 days   Alcide Evener 04/12/2022, 11:42 AM

## 2022-04-12 NOTE — Progress Notes (Signed)
PHARMACY ANTIBIOTIC CONSULT NOTE   Reginald Clarke a 31 y.o. male admitted with TV infective endocarditis. Patient is in his third course of treatment for IE with angiovac performed on TV. His initial cultures grew MSSA, however cultures from 02/24/22 grew both MSSA and MRSA. The MRSA is believed to be the result of a line infection. However, in light of recurrent infection ID has opted to treat as mixed MSSA/MRSA due to the possibility MRSA may have stuck to his valve.On the night of 9/4 patient spiked a fever to 101.2 F, and repeat blood cultures were performed which grew 4/4 MSSA. The evening of 9/5 patient's PIV infiltrated with daptomycin- initially thought to be an allergic reaction. After discussion with the patient, we learned he is a difficult stick and typically has to have IV team place lines. Patient tolerated subsequent daptomycin doses. CTS has been consulted and planning to relace TV pending dental evaluation. Pharmacy has been consulted for daptomycin dosing.  Patient weight updated (standing weight) 9/8, requiring dose adjustment of daptomycin. Patient also continues to have persistent bacteremia which is seemingly mixed with MSSA/MRSA due to lack of source control. Planning to start ceftaroline pending surgical intervention.  Estimated Creatinine Clearance: 138.1 mL/min (by C-G formula based on SCr of 0.79 mg/dL).  Plan: INCREASE Daptomycin dose to 650 mg IV Q24H (~8 mg/kg) Q24H START ceftaroline 600 mg IV Q8H  CK Qthurs  F/U daptomycin and ceftaroline sensitivities  F/U ID CTS/ID Recs    Allergies:  No Known Allergies  Filed Weights   04/09/22 0500 04/12/22 0500 04/12/22 1107  Weight: 63.6 kg (140 lb 3.4 oz) 85.1 kg (187 lb 9.8 oz) 78.2 kg (172 lb 8 oz)       Latest Ref Rng & Units 04/12/2022    3:04 AM 04/11/2022    9:39 AM 04/10/2022    8:02 AM  CBC  WBC 4.0 - 10.5 K/uL 7.2  8.2  5.2   Hemoglobin 13.0 - 17.0 g/dL 8.3  9.1  8.6   Hematocrit 39.0 - 52.0 % 25.6  28.0  26.7    Platelets 150 - 400 K/uL 100  100  83     Antibiotics Given (last 72 hours)     Date/Time Action Medication Dose Rate   04/09/22 2052 New Bag/Given   DAPTOmycin (CUBICIN) 750 mg in sodium chloride 0.9 % IVPB 750 mg 130 mL/hr   04/10/22 1457 New Bag/Given   DAPTOmycin (CUBICIN) 500 mg in sodium chloride 0.9 % IVPB 500 mg 120 mL/hr   04/11/22 1731 New Bag/Given   DAPTOmycin (CUBICIN) 500 mg in sodium chloride 0.9 % IVPB 500 mg 120 mL/hr       Microbiology results: 9/4 Bcx (most recent): 4/4 staph aureus- appears to be mixed MSSA and MRSA   Thank you for allowing pharmacy to be a part of this patient's care.  Jani Gravel, PharmD PGY-2 Infectious Diseases Resident  04/12/2022 11:18 AM

## 2022-04-13 DIAGNOSIS — I079 Rheumatic tricuspid valve disease, unspecified: Secondary | ICD-10-CM | POA: Diagnosis not present

## 2022-04-13 LAB — CBC WITH DIFFERENTIAL/PLATELET
Abs Immature Granulocytes: 0.02 10*3/uL (ref 0.00–0.07)
Basophils Absolute: 0 10*3/uL (ref 0.0–0.1)
Basophils Relative: 0 %
Eosinophils Absolute: 0.1 10*3/uL (ref 0.0–0.5)
Eosinophils Relative: 1 %
HCT: 26.7 % — ABNORMAL LOW (ref 39.0–52.0)
Hemoglobin: 8.5 g/dL — ABNORMAL LOW (ref 13.0–17.0)
Immature Granulocytes: 0 %
Lymphocytes Relative: 23 %
Lymphs Abs: 1.4 10*3/uL (ref 0.7–4.0)
MCH: 27 pg (ref 26.0–34.0)
MCHC: 31.8 g/dL (ref 30.0–36.0)
MCV: 84.8 fL (ref 80.0–100.0)
Monocytes Absolute: 0.6 10*3/uL (ref 0.1–1.0)
Monocytes Relative: 10 %
Neutro Abs: 4 10*3/uL (ref 1.7–7.7)
Neutrophils Relative %: 66 %
Platelets: 113 10*3/uL — ABNORMAL LOW (ref 150–400)
RBC: 3.15 MIL/uL — ABNORMAL LOW (ref 4.22–5.81)
RDW: 17 % — ABNORMAL HIGH (ref 11.5–15.5)
WBC: 6.1 10*3/uL (ref 4.0–10.5)
nRBC: 0 % (ref 0.0–0.2)

## 2022-04-13 LAB — COMPREHENSIVE METABOLIC PANEL
ALT: 57 U/L — ABNORMAL HIGH (ref 0–44)
AST: 42 U/L — ABNORMAL HIGH (ref 15–41)
Albumin: 3 g/dL — ABNORMAL LOW (ref 3.5–5.0)
Alkaline Phosphatase: 83 U/L (ref 38–126)
Anion gap: 12 (ref 5–15)
BUN: 13 mg/dL (ref 6–20)
CO2: 22 mmol/L (ref 22–32)
Calcium: 8.7 mg/dL — ABNORMAL LOW (ref 8.9–10.3)
Chloride: 99 mmol/L (ref 98–111)
Creatinine, Ser: 0.82 mg/dL (ref 0.61–1.24)
GFR, Estimated: >60 mL/min (ref >60)
Glucose, Bld: 91 mg/dL (ref 70–99)
Potassium: 5.2 mmol/L — ABNORMAL HIGH (ref 3.5–5.1)
Sodium: 133 mmol/L — ABNORMAL LOW (ref 135–145)
Total Bilirubin: 0.6 mg/dL (ref 0.3–1.2)
Total Protein: 7.2 g/dL (ref 6.5–8.1)

## 2022-04-13 LAB — MAGNESIUM: Magnesium: 2 mg/dL (ref 1.7–2.4)

## 2022-04-13 NOTE — Progress Notes (Signed)
PROGRESS NOTE        PATIENT DETAILS Name: Reginald Clarke Age: 31 y.o. Sex: male Date of Birth: 08-24-1990 Admit Date: 02/25/2022 Admitting Physician Dorcas Carrow, MD RAQ:[id number], [name], PA-C  Brief Summary:  Patient is a 31 y.o.  male with history of recurrent MSSA native tricuspid valve endocarditis  Significant events: 4/12-5/10>> hospitalization for MSSA bacteremia with native tricuspid valve endocarditis.  Underwent angio vac procedure on 4/26.Completed cefazolin on 5/10-planned weekly dalbavancin at the ID clinic.   5/22>> outpatient blood culture positive for MSSA 5/24-6/8>> admit to [facility] for persistent bacteremia-worsening back pain-discharge to [facility] on IV Ancef. 7/5>> discharged from [facility] on oral cefdinir.  Continued to have intermittent fever postdischarge.   7/24>> admit for-persistent fever/nausea/vomiting/diarrhea-blood cultures positive for MSSA. 9/5>> supposed to finish IV antibiotic treatment 9/6, but unfortunately he spiked high fever to 103, where his blood culture growing Staph aureus  Significant studies: 7/25>> CT chest: Multifocal airspace opacities-increasing cavitary nodules 7/25>> MRI thoracic spine: No evidence of infection in thoracic spine.  Significant microbiology data: 7/24>> urine culture: Negative 7/25>> blood culture: 1/2-MSSA. 7/27>> blood culture: No growth 7/29>> blood culture: No growth 9/5>> blood cultures Staph aureus 9/5 >> PICC line discontinued  Procedures:  8/3>> TEE: 3 vegetations on tricuspid valve-severe TR.  EF 60-65%  Consults: ID CT suregery Dentist   Subjective:  -No significant events overnight, patient denies any complaints.    Objective: Vitals: Blood pressure 106/68, pulse (!) 105, temperature 99.2 F (37.3 C), temperature source Oral, resp. rate 18, height 5\' 10"  (1.778 m), weight 78.3 kg, SpO2 92 %.   Exam:  Awake Alert, Oriented X 3, No new F.N deficits, Normal  affect Poor dentition Symmetrical Chest wall movement, Good air movement bilaterally, CTAB RRR,No Gallops,Rubs  +ve B.Sounds, Abd Soft, No tenderness, No rebound - guarding or rigidity. No Cyanosis, Clubbing or edema, No new Rash or bruise          Assessment/Plan:  Sepsis due to recurrent MSSA bacteremia with native tricuspid valve endocarditis: - treated with  6 weeks of IV antibiotics, with plan stop date 9/6 . -Fortunately he spiked another fever, repeat blood culture showing Staphylococcus aureus, he remains currently on daptomycin to cover for MSSA and MRSA for tricuspid valve endocarditis. -CT surgery consulted, likely will need TV surgery 2 weeks after dental clearance -Dentist consulted 9/6.  Orthopantogram done, showing multiple prior dental extractions with dental caries throughout remaining teeth, with persistent peridental lucencies of multiple mandibular teeth with persistent periapical lucency versus abscess at left mandibular molar.,  Discussed with CT surgery, plan for possible surgical intervention after dentist clearance early next week. -Repeat blood culture obtained after removal of PICC line unfortunately growing MRSA from 2 out of 2 sites  Normocytic anemia: Due to chronic disease/recurrent endocarditis: No indication for transfusion.  Anxiety: Stable-on Lexapro-Xanax be slowly weaned down.  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  Mild transaminitis.  Asymptomatic.  Does have chronic hepatitis C but LFTs have risen up in the last few days, asymptomatic, ultrasound stable.  Likely due to daptomycin.  ID monitoring.   Nutrition Status: Nutrition Problem: Inadequate oral intake Etiology: vomiting, nausea Signs/Symptoms: per patient/family report Interventions: Ensure Enlive (each supplement provides 350kcal and 20 grams of protein), MVI, Snacks  BMI: Estimated body mass index is 24.77  kg/m as  calculated from the following:   Height as of this encounter: 5\' 10"  (1.778 m).   Weight as of this encounter: 78.3 kg.   Code status:   Code Status: Full Code   DVT Prophylaxis: enoxaparin (LOVENOX) injection 40 mg Start: 02/25/22 1700   Family Communication: None at bedside   Disposition Plan: Status is: Inpatient    Diet: Diet Order             Diet regular Room service appropriate? Yes; Fluid consistency: Thin  Diet effective now                    MEDICATIONS: Scheduled Meds:  enoxaparin (LOVENOX) injection  40 mg Subcutaneous Q24H   escitalopram  10 mg Oral Daily   feeding supplement  237 mL Oral BID BM   methadone  135 mg Oral Daily   multivitamin with minerals  1 tablet Oral Daily   nicotine  14 mg Transdermal Daily   traZODone  50 mg Oral QHS   Continuous Infusions:  ceFTAROline (TEFLARO) IV Stopped (04/13/22 0816)   DAPTOmycin (CUBICIN) 650 mg in sodium chloride 0.9 % IVPB Stopped (04/12/22 2229)   sodium chloride     PRN Meds:.acetaminophen, ALPRAZolam, alum & mag hydroxide-simeth, bisacodyl, diclofenac Sodium, docusate sodium, hydrOXYzine, ibuprofen, loperamide, methocarbamol, polyethylene glycol, promethazine, sodium chloride, sodium chloride flush   I have personally reviewed following labs and imaging studies  LABORATORY DATA: Recent Labs  Lab 04/08/22 0430 04/09/22 0343 04/10/22 0802 04/11/22 0939 04/12/22 0304  WBC 6.7 8.2 5.2 8.2 7.2  HGB 9.0* 9.6* 8.6* 9.1* 8.3*  HCT 28.1* 30.9* 26.7* 28.0* 25.6*  PLT 109* 98* 83* 100* 100*  MCV 87.3 87.8 85.0 85.1 83.9  MCH 28.0 27.3 27.4 27.7 27.2  MCHC 32.0 31.1 32.2 32.5 32.4  RDW 17.9* 17.8* 17.5* 17.2* 16.9*  LYMPHSABS 1.3 1.5 1.0 1.4 1.5  MONOABS 0.6 0.8 0.7 0.7 0.9  EOSABS 0.3 0.1 0.1 0.1 0.1  BASOSABS 0.0 0.0 0.0 0.0 0.0    Recent Labs  Lab 04/08/22 0430 04/08/22 1848 04/09/22 0343 04/10/22 0802 04/11/22 0939 04/12/22 0304  NA 135 135 138 133* 135 134*  K 4.2 4.1 4.8 4.5 3.7  3.6  CL 101 100 103 101 101 99  CO2 29 26 26 25 25 25   GLUCOSE 101* 93 117* 103* 137* 99  BUN 21* 25* 24* 18 10 15   CREATININE 0.66 0.68 0.72 0.81 0.87 0.79  CALCIUM 9.1 9.1 9.5 8.8* 9.0 8.6*  AST 54* 55* 44* 40 28 27  ALT 108* 103* 95* 83* 68* 58*  ALKPHOS 75 86 72 70 70 66  BILITOT 0.6 0.3 0.3 0.4 0.8 0.5  ALBUMIN 3.0* 3.0* 3.0* 2.7* 3.0* 2.7*  MG 1.8  --  2.0 1.7 2.0 1.9  PROCALCITON  --  <0.10 0.19 <0.10  --   --     MICROBIOLOGY: Recent Results (from the past 240 hour(s))  Culture, blood (Routine X 2) w Reflex to ID Panel     Status: Abnormal   Collection Time: 04/08/22  7:12 PM   Specimen: BLOOD  Result Value Ref Range Status   Specimen Description BLOOD BLOOD LEFT FOREARM  Final   Special Requests   Final    BOTTLES DRAWN AEROBIC AND ANAEROBIC Blood Culture results may not be optimal due to an inadequate volume of blood received in culture bottles ARPEDB USED AS AEROBIC BOTTLE   Culture  Setup Time   Final  GRAM POSITIVE COCCI IN CLUSTERS IN BOTH AEROBIC AND ANAEROBIC BOTTLES CRITICAL RESULT CALLED TO, READ BACK BY AND VERIFIED WITH: PHARMD Isabel CapriceM. MITCHELL [phone] @1728  FH    Culture (A)  Final    STAPHYLOCOCCUS AUREUS SUSCEPTIBILITIES PERFORMED ON PREVIOUS CULTURE WITHIN THE LAST 5 DAYS. Performed at [facility], [address] [place], [place] [address]    Report Status 04/12/2022 FINAL  Final  Culture, blood (Routine X 2) w Reflex to ID Panel     Status: Abnormal (Preliminary result)   Collection Time: 04/08/22  7:12 PM   Specimen: BLOOD  Result Value Ref Range Status   Specimen Description BLOOD BLOOD LEFT FOREARM  Final   Special Requests   Final    BOTTLES DRAWN AEROBIC AND ANAEROBIC Blood Culture results may not be optimal due to an inadequate volume of blood received in culture bottles ARPEDB USED AS AEROBIC   Culture  Setup Time   Final    GRAM POSITIVE COCCI IN CLUSTERS IN BOTH AEROBIC AND ANAEROBIC BOTTLES CRITICAL RESULT CALLED TO, READ BACK BY  AND VERIFIED WITH: PHARMD MADELEINE MITCHELL ON 04/09/22 @ 1625 BY DRT    Culture (A)  Final    METHICILLIN RESISTANT STAPHYLOCOCCUS AUREUS RETEST CONFIRMED MODIFICATION OF PBP (MECA) Performed at [facility], [address] [place], [place] [address]    Report Status PENDING  Incomplete   Organism ID, Bacteria METHICILLIN RESISTANT STAPHYLOCOCCUS AUREUS  Final      Susceptibility   Methicillin resistant staphylococcus aureus - MIC*    CIPROFLOXACIN <=0.5 SENSITIVE Sensitive     ERYTHROMYCIN >=8 RESISTANT Resistant     GENTAMICIN <=0.5 SENSITIVE Sensitive     OXACILLIN RESISTANT Resistant     TETRACYCLINE <=1 SENSITIVE Sensitive     VANCOMYCIN 2 SENSITIVE Sensitive     TRIMETH/SULFA <=10 SENSITIVE Sensitive     CLINDAMYCIN <=0.25 SENSITIVE Sensitive     RIFAMPIN <=0.5 SENSITIVE Sensitive     Inducible Clindamycin NEGATIVE Sensitive     * METHICILLIN RESISTANT STAPHYLOCOCCUS AUREUS  Blood Culture ID Panel (Reflexed)     Status: Abnormal   Collection Time: 04/08/22  7:12 PM  Result Value Ref Range Status   Enterococcus faecalis NOT DETECTED NOT DETECTED Final   Enterococcus Faecium NOT DETECTED NOT DETECTED Final   Listeria monocytogenes NOT DETECTED NOT DETECTED Final   Staphylococcus species DETECTED (A) NOT DETECTED Final    Comment: CRITICAL RESULT CALLED TO, READ BACK BY AND VERIFIED WITH: PHARMD MADELEINE MITCHELL ON 04/09/22 @ 1625 BY DRT    Staphylococcus aureus (BCID) DETECTED (A) NOT DETECTED Final    Comment: CRITICAL RESULT CALLED TO, READ BACK BY AND VERIFIED WITH: PHARMD MADELEINE MITCHELL ON 04/09/22 @ 1625 BY DRT    Staphylococcus epidermidis NOT DETECTED NOT DETECTED Final   Staphylococcus lugdunensis NOT DETECTED NOT DETECTED Final   Streptococcus species NOT DETECTED NOT DETECTED Final   Streptococcus agalactiae NOT DETECTED NOT DETECTED Final   Streptococcus pneumoniae NOT DETECTED NOT DETECTED Final   Streptococcus pyogenes NOT DETECTED NOT DETECTED Final    A.calcoaceticus-baumannii NOT DETECTED NOT DETECTED Final   Bacteroides fragilis NOT DETECTED NOT DETECTED Final   Enterobacterales NOT DETECTED NOT DETECTED Final   Enterobacter cloacae complex NOT DETECTED NOT DETECTED Final   Escherichia coli NOT DETECTED NOT DETECTED Final   Klebsiella aerogenes NOT DETECTED NOT DETECTED Final   Klebsiella oxytoca NOT DETECTED NOT DETECTED Final   Klebsiella pneumoniae NOT DETECTED NOT DETECTED Final   Proteus  species NOT DETECTED NOT DETECTED Final   Salmonella species NOT DETECTED NOT DETECTED Final   Serratia marcescens NOT DETECTED NOT DETECTED Final   Haemophilus influenzae NOT DETECTED NOT DETECTED Final   Neisseria meningitidis NOT DETECTED NOT DETECTED Final   Pseudomonas aeruginosa NOT DETECTED NOT DETECTED Final   Stenotrophomonas maltophilia NOT DETECTED NOT DETECTED Final   Candida albicans NOT DETECTED NOT DETECTED Final   Candida auris NOT DETECTED NOT DETECTED Final   Candida glabrata NOT DETECTED NOT DETECTED Final   Candida krusei NOT DETECTED NOT DETECTED Final   Candida parapsilosis NOT DETECTED NOT DETECTED Final   Candida tropicalis NOT DETECTED NOT DETECTED Final   Cryptococcus neoformans/gattii NOT DETECTED NOT DETECTED Final   Meth resistant mecA/C and MREJ NOT DETECTED NOT DETECTED Final    Comment: Performed at [facility], [address], [place], [place] [address]  SARS Coronavirus 2 by RT PCR (hospital order, performed in [facility] hospital lab) *cepheid single result test* Anterior Nasal Swab     Status: None   Collection Time: 04/08/22  8:52 PM   Specimen: Anterior Nasal Swab  Result Value Ref Range Status   SARS Coronavirus 2 by RT PCR NEGATIVE NEGATIVE Final    Comment: (NOTE) SARS-CoV-2 target nucleic acids are NOT DETECTED.  The SARS-CoV-2 RNA is generally detectable in upper and lower respiratory specimens during the acute phase of infection. The lowest concentration of SARS-CoV-2 viral copies this  assay can detect is 250 copies / mL. A negative result does not preclude SARS-CoV-2 infection and should not be used as the sole basis for treatment or other patient management decisions.  A negative result may occur with improper specimen collection / handling, submission of specimen other than nasopharyngeal swab, presence of viral mutation(s) within the areas targeted by this assay, and inadequate number of viral copies (<250 copies / mL). A negative result must be combined with clinical observations, patient history, and epidemiological information.  Fact Sheet for Patients:   RoadLapTop.co.za  Fact Sheet for Healthcare Providers: http://kim-miller.com/  This test is not yet approved or  cleared by the [place] FDA and has been authorized for detection and/or diagnosis of SARS-CoV-2 by FDA under an Emergency Use Authorization (EUA).  This EUA will remain in effect (meaning this test can be used) for the duration of the COVID-19 declaration under Section 564(b)(1) of the Act, 21 U.S.C. section 360bbb-3(b)(1), unless the authorization is terminated or revoked sooner.  Performed at [facility], [address] [place], [place] [address]   Culture, blood (Routine X 2) w Reflex to ID Panel     Status: None (Preliminary result)   Collection Time: 04/11/22 10:05 AM   Specimen: BLOOD RIGHT ARM  Result Value Ref Range Status   Specimen Description BLOOD RIGHT ARM  Final   Special Requests   Final    BOTTLES DRAWN AEROBIC AND ANAEROBIC Blood Culture results may not be optimal due to an inadequate volume of blood received in culture bottles   Culture   Final    NO GROWTH 2 DAYS Performed at [facility], [address], [place], [place] [address]    Report Status PENDING  Incomplete  Culture, blood (Routine X 2) w Reflex to ID Panel     Status: None (Preliminary result)   Collection Time: 04/11/22 10:05 AM   Specimen:  BLOOD LEFT ARM  Result Value Ref Range Status   Specimen Description BLOOD LEFT ARM  Final  Special Requests   Final    BOTTLES DRAWN AEROBIC AND ANAEROBIC Blood Culture results may not be optimal due to an inadequate volume of blood received in culture bottles   Culture   Final    NO GROWTH 2 DAYS Performed at [facility], [address], [place], [place] [address]    Report Status PENDING  Incomplete    RADIOLOGY STUDIES/RESULTS: No results found.   LOS: 47 days   Signature  Huey Bienenstock M.D on 04/13/2022 at 2:29 PM   -  To page go to [email or web address]

## 2022-04-14 DIAGNOSIS — I079 Rheumatic tricuspid valve disease, unspecified: Secondary | ICD-10-CM | POA: Diagnosis not present

## 2022-04-14 LAB — COMPREHENSIVE METABOLIC PANEL
ALT: 59 U/L — ABNORMAL HIGH (ref 0–44)
AST: 31 U/L (ref 15–41)
Albumin: 2.9 g/dL — ABNORMAL LOW (ref 3.5–5.0)
Alkaline Phosphatase: 73 U/L (ref 38–126)
Anion gap: 10 (ref 5–15)
BUN: 13 mg/dL (ref 6–20)
CO2: 24 mmol/L (ref 22–32)
Calcium: 9 mg/dL (ref 8.9–10.3)
Chloride: 102 mmol/L (ref 98–111)
Creatinine, Ser: 0.93 mg/dL (ref 0.61–1.24)
GFR, Estimated: >60 mL/min (ref >60)
Glucose, Bld: 98 mg/dL (ref 70–99)
Potassium: 3.7 mmol/L (ref 3.5–5.1)
Sodium: 136 mmol/L (ref 135–145)
Total Bilirubin: 0.2 mg/dL — ABNORMAL LOW (ref 0.3–1.2)
Total Protein: 7.6 g/dL (ref 6.5–8.1)

## 2022-04-14 LAB — CBC WITH DIFFERENTIAL/PLATELET
Abs Immature Granulocytes: 0.03 10*3/uL (ref 0.00–0.07)
Basophils Absolute: 0 10*3/uL (ref 0.0–0.1)
Basophils Relative: 0 %
Eosinophils Absolute: 0.1 10*3/uL (ref 0.0–0.5)
Eosinophils Relative: 1 %
HCT: 28.4 % — ABNORMAL LOW (ref 39.0–52.0)
Hemoglobin: 9 g/dL — ABNORMAL LOW (ref 13.0–17.0)
Immature Granulocytes: 0 %
Lymphocytes Relative: 28 %
Lymphs Abs: 2 10*3/uL (ref 0.7–4.0)
MCH: 26.6 pg (ref 26.0–34.0)
MCHC: 31.7 g/dL (ref 30.0–36.0)
MCV: 84 fL (ref 80.0–100.0)
Monocytes Absolute: 0.8 10*3/uL (ref 0.1–1.0)
Monocytes Relative: 11 %
Neutro Abs: 4.3 10*3/uL (ref 1.7–7.7)
Neutrophils Relative %: 60 %
Platelets: 153 10*3/uL (ref 150–400)
RBC: 3.38 MIL/uL — ABNORMAL LOW (ref 4.22–5.81)
RDW: 16.7 % — ABNORMAL HIGH (ref 11.5–15.5)
WBC: 7.2 10*3/uL (ref 4.0–10.5)
nRBC: 0 % (ref 0.0–0.2)

## 2022-04-14 LAB — MAGNESIUM: Magnesium: 2 mg/dL (ref 1.7–2.4)

## 2022-04-14 NOTE — Plan of Care (Signed)

## 2022-04-14 NOTE — Progress Notes (Signed)
PROGRESS NOTE        PATIENT DETAILS Name: Reginald Clarke Age: 31 y.o. Sex: male Date of Birth: 09/14/1990 Admit Date: 02/25/2022 Admitting Physician Dorcas Carrow, MD ZOX:[id number], [name], PA-C  Brief Summary:  Patient is a 31 y.o.  male with history of recurrent MSSA native tricuspid valve endocarditis  Significant events: 4/12-5/10>> hospitalization for MSSA bacteremia with native tricuspid valve endocarditis.  Underwent angio vac procedure on 4/26.Completed cefazolin on 5/10-planned weekly dalbavancin at the ID clinic.   5/22>> outpatient blood culture positive for MSSA 5/24-6/8>> admit to [facility] for persistent bacteremia-worsening back pain-discharge to [facility] on IV Ancef. 7/5>> discharged from [facility] on oral cefdinir.  Continued to have intermittent fever postdischarge.   7/24>> admit for-persistent fever/nausea/vomiting/diarrhea-blood cultures positive for MSSA. 9/5>> supposed to finish IV antibiotic treatment 9/6, but unfortunately he spiked high fever to 103, where his blood culture growing Staph aureus  Significant studies: 7/25>> CT chest: Multifocal airspace opacities-increasing cavitary nodules 7/25>> MRI thoracic spine: No evidence of infection in thoracic spine.  Significant microbiology data: 7/24>> urine culture: Negative 7/25>> blood culture: 1/2-MSSA. 7/27>> blood culture: No growth 7/29>> blood culture: No growth 9/5>> blood cultures Staph aureus 9/5 >> PICC line discontinued  Procedures:  8/3>> TEE: 3 vegetations on tricuspid valve-severe TR.  EF 60-65%  Consults: ID CT suregery Dentist   Subjective:  -No significant events overnight, patient denies any complaints.    Objective: Vitals: Blood pressure 107/64, pulse (!) 104, temperature 98.4 F (36.9 C), temperature source Oral, resp. rate 17, height 5\' 10"  (1.778 m), weight 78.6 kg, SpO2 92 %.   Exam:  Awake Alert, Oriented X 3, No new F.N deficits, Normal  affect Poor dentition Symmetrical Chest wall movement, Good air movement bilaterally, CTAB RRR,No Gallops,Rubs  +ve B.Sounds, Abd Soft, No tenderness, No rebound - guarding or rigidity. No Cyanosis, Clubbing or edema, No new Rash or bruise          Assessment/Plan:  Sepsis due to recurrent MSSA bacteremia with native tricuspid valve endocarditis: - treated with  6 weeks of IV antibiotics, with plan stop date 9/6 . -Fortunately he spiked another fever, repeat blood culture showing Staphylococcus aureus, he remains currently on daptomycin to cover for MSSA and MRSA for tricuspid valve endocarditis. -CT surgery consulted, likely will need TV surgery 2 weeks after dental clearance -Dentist consulted 9/6.  Orthopantogram done, showing multiple prior dental extractions with dental caries throughout remaining teeth, with persistent peridental lucencies of multiple mandibular teeth with persistent periapical lucency versus abscess at left mandibular molar.,  Discussed with CT surgery, plan for possible surgical intervention after dentist clearance early next week. -Repeat blood culture obtained after removal of PICC line unfortunately growing MRSA from 2 out of 2 sites  Normocytic anemia: Due to chronic disease/recurrent endocarditis: No indication for transfusion.  Anxiety: Stable-on Lexapro-Xanax be slowly weaned down.  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  Mild transaminitis.  Asymptomatic.  Does have chronic hepatitis C but LFTs have risen up in the last few days, asymptomatic, ultrasound stable.  Likely due to daptomycin.  ID monitoring.   Nutrition Status: Nutrition Problem: Inadequate oral intake Etiology: vomiting, nausea Signs/Symptoms: per patient/family report Interventions: Ensure Enlive (each supplement provides 350kcal and 20 grams of protein), MVI, Snacks  BMI: Estimated body mass index is 24.86  kg/m as  calculated from the following:   Height as of this encounter: 5\' 10"  (1.778 m).   Weight as of this encounter: 78.6 kg.   Code status:   Code Status: Full Code   DVT Prophylaxis: enoxaparin (LOVENOX) injection 40 mg Start: 02/25/22 1700   Family Communication: None at bedside   Disposition Plan: Status is: Inpatient    Diet: Diet Order             Diet regular Room service appropriate? Yes; Fluid consistency: Thin  Diet effective now                    MEDICATIONS: Scheduled Meds:  enoxaparin (LOVENOX) injection  40 mg Subcutaneous Q24H   escitalopram  10 mg Oral Daily   feeding supplement  237 mL Oral BID BM   methadone  135 mg Oral Daily   multivitamin with minerals  1 tablet Oral Daily   nicotine  14 mg Transdermal Daily   traZODone  50 mg Oral QHS   Continuous Infusions:  ceFTAROline (TEFLARO) IV Stopped (04/14/22 0700)   DAPTOmycin (CUBICIN) 650 mg in sodium chloride 0.9 % IVPB 650 mg (04/13/22 2132)   sodium chloride     PRN Meds:.acetaminophen, ALPRAZolam, alum & mag hydroxide-simeth, bisacodyl, diclofenac Sodium, docusate sodium, hydrOXYzine, ibuprofen, loperamide, methocarbamol, polyethylene glycol, promethazine, sodium chloride, sodium chloride flush   I have personally reviewed following labs and imaging studies  LABORATORY DATA: Recent Labs  Lab 04/10/22 0802 04/11/22 0939 04/12/22 0304 04/13/22 1400 04/14/22 0224  WBC 5.2 8.2 7.2 6.1 7.2  HGB 8.6* 9.1* 8.3* 8.5* 9.0*  HCT 26.7* 28.0* 25.6* 26.7* 28.4*  PLT 83* 100* 100* 113* 153  MCV 85.0 85.1 83.9 84.8 84.0  MCH 27.4 27.7 27.2 27.0 26.6  MCHC 32.2 32.5 32.4 31.8 31.7  RDW 17.5* 17.2* 16.9* 17.0* 16.7*  LYMPHSABS 1.0 1.4 1.5 1.4 2.0  MONOABS 0.7 0.7 0.9 0.6 0.8  EOSABS 0.1 0.1 0.1 0.1 0.1  BASOSABS 0.0 0.0 0.0 0.0 0.0    Recent Labs  Lab 04/08/22 1848 04/09/22 0343 04/10/22 0802 04/11/22 0939 04/12/22 0304 04/13/22 1400 04/14/22 0224  NA 135 138 133* 135 134* 133* 136  K  4.1 4.8 4.5 3.7 3.6 5.2* 3.7  CL 100 103 101 101 99 99 102  CO2 26 26 25 25 25 22 24   GLUCOSE 93 117* 103* 137* 99 91 98  BUN 25* 24* 18 10 15 13 13   CREATININE 0.68 0.72 0.81 0.87 0.79 0.82 0.93  CALCIUM 9.1 9.5 8.8* 9.0 8.6* 8.7* 9.0  AST 55* 44* 40 28 27 42* 31  ALT 103* 95* 83* 68* 58* 57* 59*  ALKPHOS 86 72 70 70 66 83 73  BILITOT 0.3 0.3 0.4 0.8 0.5 0.6 0.2*  ALBUMIN 3.0* 3.0* 2.7* 3.0* 2.7* 3.0* 2.9*  MG  --  2.0 1.7 2.0 1.9 2.0 2.0  PROCALCITON <0.10 0.19 <0.10  --   --   --   --     MICROBIOLOGY: Recent Results (from the past 240 hour(s))  Culture, blood (Routine X 2) w Reflex to ID Panel     Status: Abnormal   Collection Time: 04/08/22  7:12 PM   Specimen: BLOOD  Result Value Ref Range Status   Specimen Description BLOOD BLOOD LEFT FOREARM  Final   Special Requests   Final    BOTTLES DRAWN AEROBIC AND ANAEROBIC Blood Culture results may not be optimal due to an inadequate volume of blood  received in culture bottles ARPEDB USED AS AEROBIC BOTTLE   Culture  Setup Time   Final    GRAM POSITIVE COCCI IN CLUSTERS IN BOTH AEROBIC AND ANAEROBIC BOTTLES CRITICAL RESULT CALLED TO, READ BACK BY AND VERIFIED WITH: PHARMD Isabel CapriceM. MITCHELL [phone] @1728  FH    Culture (A)  Final    STAPHYLOCOCCUS AUREUS SUSCEPTIBILITIES PERFORMED ON PREVIOUS CULTURE WITHIN THE LAST 5 DAYS. Performed at [facility], [address] [place], [place] [address]    Report Status 04/12/2022 FINAL  Final  Culture, blood (Routine X 2) w Reflex to ID Panel     Status: Abnormal (Preliminary result)   Collection Time: 04/08/22  7:12 PM   Specimen: BLOOD  Result Value Ref Range Status   Specimen Description BLOOD BLOOD LEFT FOREARM  Final   Special Requests   Final    BOTTLES DRAWN AEROBIC AND ANAEROBIC Blood Culture results may not be optimal due to an inadequate volume of blood received in culture bottles ARPEDB USED AS AEROBIC   Culture  Setup Time   Final    GRAM POSITIVE COCCI IN CLUSTERS IN BOTH  AEROBIC AND ANAEROBIC BOTTLES CRITICAL RESULT CALLED TO, READ BACK BY AND VERIFIED WITH: PHARMD MADELEINE MITCHELL ON 04/09/22 @ 1625 BY DRT    Culture (A)  Final    METHICILLIN RESISTANT STAPHYLOCOCCUS AUREUS RETEST CONFIRMED MODIFICATION OF PBP (MECA) Sent to [facility] for further susceptibility testing. Performed at [facility], [address] [place], [place] [address]    Report Status PENDING  Incomplete   Organism ID, Bacteria METHICILLIN RESISTANT STAPHYLOCOCCUS AUREUS  Final      Susceptibility   Methicillin resistant staphylococcus aureus - MIC*    CIPROFLOXACIN <=0.5 SENSITIVE Sensitive     ERYTHROMYCIN >=8 RESISTANT Resistant     GENTAMICIN <=0.5 SENSITIVE Sensitive     OXACILLIN RESISTANT Resistant     TETRACYCLINE <=1 SENSITIVE Sensitive     VANCOMYCIN 2 SENSITIVE Sensitive     TRIMETH/SULFA <=10 SENSITIVE Sensitive     CLINDAMYCIN <=0.25 SENSITIVE Sensitive     RIFAMPIN <=0.5 SENSITIVE Sensitive     Inducible Clindamycin NEGATIVE Sensitive     * METHICILLIN RESISTANT STAPHYLOCOCCUS AUREUS  Blood Culture ID Panel (Reflexed)     Status: Abnormal   Collection Time: 04/08/22  7:12 PM  Result Value Ref Range Status   Enterococcus faecalis NOT DETECTED NOT DETECTED Final   Enterococcus Faecium NOT DETECTED NOT DETECTED Final   Listeria monocytogenes NOT DETECTED NOT DETECTED Final   Staphylococcus species DETECTED (A) NOT DETECTED Final    Comment: CRITICAL RESULT CALLED TO, READ BACK BY AND VERIFIED WITH: PHARMD MADELEINE MITCHELL ON 04/09/22 @ 1625 BY DRT    Staphylococcus aureus (BCID) DETECTED (A) NOT DETECTED Final    Comment: CRITICAL RESULT CALLED TO, READ BACK BY AND VERIFIED WITH: PHARMD MADELEINE MITCHELL ON 04/09/22 @ 1625 BY DRT    Staphylococcus epidermidis NOT DETECTED NOT DETECTED Final   Staphylococcus lugdunensis NOT DETECTED NOT DETECTED Final   Streptococcus species NOT DETECTED NOT DETECTED Final   Streptococcus agalactiae NOT DETECTED NOT DETECTED  Final   Streptococcus pneumoniae NOT DETECTED NOT DETECTED Final   Streptococcus pyogenes NOT DETECTED NOT DETECTED Final   A.calcoaceticus-baumannii NOT DETECTED NOT DETECTED Final   Bacteroides fragilis NOT DETECTED NOT DETECTED Final   Enterobacterales NOT DETECTED NOT DETECTED Final   Enterobacter cloacae complex NOT DETECTED NOT DETECTED Final   Escherichia coli NOT DETECTED NOT DETECTED Final  Klebsiella aerogenes NOT DETECTED NOT DETECTED Final   Klebsiella oxytoca NOT DETECTED NOT DETECTED Final   Klebsiella pneumoniae NOT DETECTED NOT DETECTED Final   Proteus species NOT DETECTED NOT DETECTED Final   Salmonella species NOT DETECTED NOT DETECTED Final   Serratia marcescens NOT DETECTED NOT DETECTED Final   Haemophilus influenzae NOT DETECTED NOT DETECTED Final   Neisseria meningitidis NOT DETECTED NOT DETECTED Final   Pseudomonas aeruginosa NOT DETECTED NOT DETECTED Final   Stenotrophomonas maltophilia NOT DETECTED NOT DETECTED Final   Candida albicans NOT DETECTED NOT DETECTED Final   Candida auris NOT DETECTED NOT DETECTED Final   Candida glabrata NOT DETECTED NOT DETECTED Final   Candida krusei NOT DETECTED NOT DETECTED Final   Candida parapsilosis NOT DETECTED NOT DETECTED Final   Candida tropicalis NOT DETECTED NOT DETECTED Final   Cryptococcus neoformans/gattii NOT DETECTED NOT DETECTED Final   Meth resistant mecA/C and MREJ NOT DETECTED NOT DETECTED Final    Comment: Performed at [facility], [address] [place], [place] [address]  Min Inhibitory Conc (2 Drugs)     Status: Abnormal   Collection Time: 04/08/22  7:12 PM  Result Value Ref Range Status   Min Inhibitory Conc (2 Drugs) Preliminary report (A)  Final    Comment: (NOTE) Performed At: [facility] [address] [place], [place] [id number] Jolene Schimke MD RF:[id number]    Source (MIC2) [phone]  Final    Comment: Performed at [facility], [address] [place], [place] [address]   MIC Results (2 Drugs)     Status: Abnormal   Collection Time: 04/08/22  7:12 PM  Result Value Ref Range Status   RESULT 5 MIC (RESULT 1)                Base Name: Comment (A)  Final    Comment: (NOTE) Methicillin - resistant Staphylococcus aureus Identification performed by account, not confirmed by this laboratory. Performed At: [facility] [address] [place], [place] [id number] Jolene Schimke MD QT:[id number]   SARS Coronavirus 2 by RT PCR (hospital order, performed in [facility] hospital lab) *cepheid single result test* Anterior Nasal Swab     Status: None   Collection Time: 04/08/22  8:52 PM   Specimen: Anterior Nasal Swab  Result Value Ref Range Status   SARS Coronavirus 2 by RT PCR NEGATIVE NEGATIVE Final    Comment: (NOTE) SARS-CoV-2 target nucleic acids are NOT DETECTED.  The SARS-CoV-2 RNA is generally detectable in upper and lower respiratory specimens during the acute phase of infection. The lowest concentration of SARS-CoV-2 viral copies this assay can detect is 250 copies / mL. A negative result does not preclude SARS-CoV-2 infection and should not be used as the sole basis for treatment or other patient management decisions.  A negative result may occur with improper specimen collection / handling, submission of specimen other than nasopharyngeal swab, presence of viral mutation(s) within the areas targeted by this assay, and inadequate number of viral copies (<250 copies / mL). A negative result must be combined with clinical observations, patient history, and epidemiological information.  Fact Sheet for Patients:   RoadLapTop.co.za  Fact Sheet for Healthcare Providers: http://kim-miller.com/  This test is not yet approved or  cleared by the [place] FDA and has been authorized for detection and/or diagnosis of SARS-CoV-2 by FDA under an Emergency Use Authorization (EUA).  This EUA will remain in  effect (meaning this test can be used) for the  duration of the COVID-19 declaration under Section 564(b)(1) of the Act, 21 U.S.C. section 360bbb-3(b)(1), unless the authorization is terminated or revoked sooner.  Performed at [facility], [address] [place], [place] [address]   Culture, blood (Routine X 2) w Reflex to ID Panel     Status: None (Preliminary result)   Collection Time: 04/11/22 10:05 AM   Specimen: BLOOD RIGHT ARM  Result Value Ref Range Status   Specimen Description BLOOD RIGHT ARM  Final   Special Requests   Final    BOTTLES DRAWN AEROBIC AND ANAEROBIC Blood Culture results may not be optimal due to an inadequate volume of blood received in culture bottles   Culture   Final    NO GROWTH 3 DAYS Performed at [facility], [address], [place], [place] [address]    Report Status PENDING  Incomplete  Culture, blood (Routine X 2) w Reflex to ID Panel     Status: None (Preliminary result)   Collection Time: 04/11/22 10:05 AM   Specimen: BLOOD LEFT ARM  Result Value Ref Range Status   Specimen Description BLOOD LEFT ARM  Final   Special Requests   Final    BOTTLES DRAWN AEROBIC AND ANAEROBIC Blood Culture results may not be optimal due to an inadequate volume of blood received in culture bottles   Culture   Final    NO GROWTH 3 DAYS Performed at [facility], [address] [place], [place] [address]    Report Status PENDING  Incomplete    RADIOLOGY STUDIES/RESULTS: No results found.   LOS: 48 days   Signature  Huey Bienenstock M.D on 04/14/2022 at 12:55 PM   -  To page go to [email or web address]

## 2022-04-15 DIAGNOSIS — K083 Retained dental root: Secondary | ICD-10-CM

## 2022-04-15 DIAGNOSIS — F40232 Fear of other medical care: Secondary | ICD-10-CM

## 2022-04-15 DIAGNOSIS — Z5181 Encounter for therapeutic drug level monitoring: Secondary | ICD-10-CM

## 2022-04-15 DIAGNOSIS — K036 Deposits [accretions] on teeth: Secondary | ICD-10-CM

## 2022-04-15 DIAGNOSIS — I079 Rheumatic tricuspid valve disease, unspecified: Secondary | ICD-10-CM | POA: Diagnosis not present

## 2022-04-15 DIAGNOSIS — K045 Chronic apical periodontitis: Secondary | ICD-10-CM

## 2022-04-15 DIAGNOSIS — Z01818 Encounter for other preprocedural examination: Secondary | ICD-10-CM

## 2022-04-15 DIAGNOSIS — K011 Impacted teeth: Secondary | ICD-10-CM

## 2022-04-15 DIAGNOSIS — K029 Dental caries, unspecified: Secondary | ICD-10-CM

## 2022-04-15 DIAGNOSIS — K0530 Chronic periodontitis, unspecified: Secondary | ICD-10-CM

## 2022-04-15 DIAGNOSIS — K08109 Complete loss of teeth, unspecified cause, unspecified class: Secondary | ICD-10-CM

## 2022-04-15 LAB — CBC WITH DIFFERENTIAL/PLATELET
Abs Immature Granulocytes: 0.03 10*3/uL (ref 0.00–0.07)
Basophils Absolute: 0 10*3/uL (ref 0.0–0.1)
Basophils Relative: 1 %
Eosinophils Absolute: 0.2 10*3/uL (ref 0.0–0.5)
Eosinophils Relative: 4 %
HCT: 29.7 % — ABNORMAL LOW (ref 39.0–52.0)
Hemoglobin: 9.5 g/dL — ABNORMAL LOW (ref 13.0–17.0)
Immature Granulocytes: 1 %
Lymphocytes Relative: 33 %
Lymphs Abs: 1.7 10*3/uL (ref 0.7–4.0)
MCH: 27 pg (ref 26.0–34.0)
MCHC: 32 g/dL (ref 30.0–36.0)
MCV: 84.4 fL (ref 80.0–100.0)
Monocytes Absolute: 0.5 10*3/uL (ref 0.1–1.0)
Monocytes Relative: 10 %
Neutro Abs: 2.5 10*3/uL (ref 1.7–7.7)
Neutrophils Relative %: 51 %
Platelets: 151 10*3/uL (ref 150–400)
RBC: 3.52 MIL/uL — ABNORMAL LOW (ref 4.22–5.81)
RDW: 16.6 % — ABNORMAL HIGH (ref 11.5–15.5)
WBC: 5 10*3/uL (ref 4.0–10.5)
nRBC: 0 % (ref 0.0–0.2)

## 2022-04-15 LAB — MAGNESIUM: Magnesium: 2.3 mg/dL (ref 1.7–2.4)

## 2022-04-15 NOTE — Progress Notes (Signed)
PROGRESS NOTE        PATIENT DETAILS Name: Reginald Clarke Age: 31 y.o. Sex: male Date of Birth: 1991/07/02 Admit Date: 02/25/2022 Admitting Physician Dorcas Carrow, MD EXN:[id number], [name], PA-C  Brief Summary:  Patient is a 31 y.o.  male with history of recurrent MSSA native tricuspid valve endocarditis  Significant events: 4/12-5/10>> hospitalization for MSSA bacteremia with native tricuspid valve endocarditis.  Underwent angio vac procedure on 4/26.Completed cefazolin on 5/10-planned weekly dalbavancin at the ID clinic.   5/22>> outpatient blood culture positive for MSSA 5/24-6/8>> admit to [facility] for persistent bacteremia-worsening back pain-discharge to [facility] on IV Ancef. 7/5>> discharged from [facility] on oral cefdinir.  Continued to have intermittent fever postdischarge.   7/24>> admit for-persistent fever/nausea/vomiting/diarrhea-blood cultures positive for MSSA. 9/5>> supposed to finish IV antibiotic treatment 9/6, but unfortunately he spiked high fever to 103, where his blood culture growing Staph aureus  Significant studies: 7/25>> CT chest: Multifocal airspace opacities-increasing cavitary nodules 7/25>> MRI thoracic spine: No evidence of infection in thoracic spine.  Significant microbiology data: 7/24>> urine culture: Negative 7/25>> blood culture: 1/2-MSSA. 7/27>> blood culture: No growth 7/29>> blood culture: No growth 9/5>> blood cultures Staph aureus 9/5 >> PICC line discontinued  Procedures:  8/3>> TEE: 3 vegetations on tricuspid valve-severe TR.  EF 60-65%  Consults: ID CT suregery Dentist   Subjective:  -No significant events overnight, patient denies any complaints.    Objective: Vitals: Blood pressure 99/75, pulse (!) 104, temperature 97.7 F (36.5 C), temperature source Oral, resp. rate 18, height 5\' 10"  (1.778 m), weight 81.5 kg, SpO2 94 %.   Exam:  Awake Alert, Oriented X 3, No new F.N deficits, Normal  affect Poor dentition Symmetrical Chest wall movement, Good air movement bilaterally, CTAB RRR,No Gallops,Rubs  +ve B.Sounds, Abd Soft, No tenderness, No rebound - guarding or rigidity. No Cyanosis, Clubbing or edema, No new Rash or bruise           Assessment/Plan:  Sepsis due to recurrent MSSA bacteremia with native tricuspid valve endocarditis: - treated with  6 weeks of IV antibiotics, with plan stop date 9/6 . -Fortunately he spiked another fever, repeat blood culture showing Staphylococcus aureus, he remains currently on daptomycin to cover for MSSA and MRSA for tricuspid valve endocarditis. -CT surgery consulted, likely will need TV surgery 2 weeks after dental clearance -Dentist consulted 9/6.  Orthopantogram done, showing multiple prior dental extractions with dental caries throughout remaining teeth, with persistent peridental lucencies of multiple mandibular teeth with persistent periapical lucency versus abscess at left mandibular molar.,  Discussed with CT surgery, plan for possible surgical intervention after dentist clearance , I have discussed with dental office today, patient will be evaluated by dentist today. -Repeat blood culture obtained after removal of PICC line unfortunately growing MRSA from 2 out of 2 sites -Most recent blood cultures on 9/7 remains negative to date.  Normocytic anemia: Due to chronic disease/recurrent endocarditis: No indication for transfusion.  Anxiety: Stable-on Lexapro-Xanax be slowly weaned down.  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  Mild transaminitis.  -Trending down   Nutrition Status: Nutrition Problem: Inadequate oral intake Etiology: vomiting, nausea Signs/Symptoms: per patient/family report Interventions: Ensure Enlive (each supplement provides 350kcal and 20 grams of protein), MVI, Snacks  BMI: Estimated body mass index is 25.78 kg/m as calculated  from the following:   Height as of this encounter: 5\' 10"  (1.778 m).   Weight as of this encounter: 81.5 kg.   Code status:   Code Status: Full Code   DVT Prophylaxis: enoxaparin (LOVENOX) injection 40 mg Start: 02/25/22 1700   Family Communication: None at bedside   Disposition Plan: Status is: Inpatient    Diet: Diet Order             Diet regular Room service appropriate? Yes; Fluid consistency: Thin  Diet effective now                    MEDICATIONS: Scheduled Meds:  enoxaparin (LOVENOX) injection  40 mg Subcutaneous Q24H   escitalopram  10 mg Oral Daily   feeding supplement  237 mL Oral BID BM   methadone  135 mg Oral Daily   multivitamin with minerals  1 tablet Oral Daily   nicotine  14 mg Transdermal Daily   traZODone  50 mg Oral QHS   Continuous Infusions:  ceFTAROline (TEFLARO) IV 600 mg (04/15/22 0649)   DAPTOmycin (CUBICIN) 650 mg in sodium chloride 0.9 % IVPB Stopped (04/15/22 0819)   sodium chloride     PRN Meds:.acetaminophen, ALPRAZolam, alum & mag hydroxide-simeth, bisacodyl, diclofenac Sodium, docusate sodium, hydrOXYzine, ibuprofen, loperamide, methocarbamol, polyethylene glycol, promethazine, sodium chloride, sodium chloride flush   I have personally reviewed following labs and imaging studies  LABORATORY DATA: Recent Labs  Lab 04/11/22 0939 04/12/22 0304 04/13/22 1400 04/14/22 0224 04/15/22 0705  WBC 8.2 7.2 6.1 7.2 5.0  HGB 9.1* 8.3* 8.5* 9.0* 9.5*  HCT 28.0* 25.6* 26.7* 28.4* 29.7*  PLT 100* 100* 113* 153 151  MCV 85.1 83.9 84.8 84.0 84.4  MCH 27.7 27.2 27.0 26.6 27.0  MCHC 32.5 32.4 31.8 31.7 32.0  RDW 17.2* 16.9* 17.0* 16.7* 16.6*  LYMPHSABS 1.4 1.5 1.4 2.0 1.7  MONOABS 0.7 0.9 0.6 0.8 0.5  EOSABS 0.1 0.1 0.1 0.1 0.2  BASOSABS 0.0 0.0 0.0 0.0 0.0    Recent Labs  Lab 04/08/22 1848 04/08/22 1848 04/09/22 0343 04/10/22 0802 04/11/22 0939 04/12/22 0304 04/13/22 1400 04/14/22 0224 04/15/22 0705  NA 135  --  138  133* 135 134* 133* 136  --   K 4.1  --  4.8 4.5 3.7 3.6 5.2* 3.7  --   CL 100  --  103 101 101 99 99 102  --   CO2 26  --  26 25 25 25 22 24   --   GLUCOSE 93  --  117* 103* 137* 99 91 98  --   BUN 25*  --  24* 18 10 15 13 13   --   CREATININE 0.68  --  0.72 0.81 0.87 0.79 0.82 0.93  --   CALCIUM 9.1  --  9.5 8.8* 9.0 8.6* 8.7* 9.0  --   AST 55*  --  44* 40 28 27 42* 31  --   ALT 103*  --  95* 83* 68* 58* 57* 59*  --   ALKPHOS 86  --  72 70 70 66 83 73  --   BILITOT 0.3  --  0.3 0.4 0.8 0.5 0.6 0.2*  --   ALBUMIN 3.0*  --  3.0* 2.7* 3.0* 2.7* 3.0* 2.9*  --   MG  --    < > 2.0 1.7 2.0 1.9 2.0 2.0 2.3  PROCALCITON <0.10  --  0.19 <0.10  --   --   --   --   --    < > =  values in this interval not displayed.    MICROBIOLOGY: Recent Results (from the past 240 hour(s))  Culture, blood (Routine X 2) w Reflex to ID Panel     Status: Abnormal   Collection Time: 04/08/22  7:12 PM   Specimen: BLOOD  Result Value Ref Range Status   Specimen Description BLOOD BLOOD LEFT FOREARM  Final   Special Requests   Final    BOTTLES DRAWN AEROBIC AND ANAEROBIC Blood Culture results may not be optimal due to an inadequate volume of blood received in culture bottles ARPEDB USED AS AEROBIC BOTTLE   Culture  Setup Time   Final    GRAM POSITIVE COCCI IN CLUSTERS IN BOTH AEROBIC AND ANAEROBIC BOTTLES CRITICAL RESULT CALLED TO, READ BACK BY AND VERIFIED WITH: PHARMD Isabel Caprice [id number] @1728  FH    Culture (A)  Final    STAPHYLOCOCCUS AUREUS SUSCEPTIBILITIES PERFORMED ON PREVIOUS CULTURE WITHIN THE LAST 5 DAYS. Performed at [facility], [address] [place], [place]    Report Status 04/12/2022 FINAL  Final  Culture, blood (Routine X 2) w Reflex to ID Panel     Status: Abnormal (Preliminary result)   Collection Time: 04/08/22  7:12 PM   Specimen: BLOOD  Result Value Ref Range Status   Specimen Description BLOOD BLOOD LEFT FOREARM  Final   Special Requests   Final    BOTTLES DRAWN AEROBIC  AND ANAEROBIC Blood Culture results may not be optimal due to an inadequate volume of blood received in culture bottles ARPEDB USED AS AEROBIC   Culture  Setup Time   Final    GRAM POSITIVE COCCI IN CLUSTERS IN BOTH AEROBIC AND ANAEROBIC BOTTLES CRITICAL RESULT CALLED TO, READ BACK BY AND VERIFIED WITH: PHARMD MADELEINE MITCHELL ON 04/09/22 @ 1625 BY DRT    Culture (A)  Final    METHICILLIN RESISTANT STAPHYLOCOCCUS AUREUS RETEST CONFIRMED MODIFICATION OF PBP (MECA) Sent to [facility] for further susceptibility testing. Performed at [facility], [address], [place], [place]    Report Status PENDING  Incomplete   Organism ID, Bacteria METHICILLIN RESISTANT STAPHYLOCOCCUS AUREUS  Final      Susceptibility   Methicillin resistant staphylococcus aureus - MIC*    CIPROFLOXACIN <=0.5 SENSITIVE Sensitive     ERYTHROMYCIN >=8 RESISTANT Resistant     GENTAMICIN <=0.5 SENSITIVE Sensitive     OXACILLIN RESISTANT Resistant     TETRACYCLINE <=1 SENSITIVE Sensitive     VANCOMYCIN 2 SENSITIVE Sensitive     TRIMETH/SULFA <=10 SENSITIVE Sensitive     CLINDAMYCIN <=0.25 SENSITIVE Sensitive     RIFAMPIN <=0.5 SENSITIVE Sensitive     Inducible Clindamycin NEGATIVE Sensitive     * METHICILLIN RESISTANT STAPHYLOCOCCUS AUREUS  Blood Culture ID Panel (Reflexed)     Status: Abnormal   Collection Time: 04/08/22  7:12 PM  Result Value Ref Range Status   Enterococcus faecalis NOT DETECTED NOT DETECTED Final   Enterococcus Faecium NOT DETECTED NOT DETECTED Final   Listeria monocytogenes NOT DETECTED NOT DETECTED Final   Staphylococcus species DETECTED (A) NOT DETECTED Final    Comment: CRITICAL RESULT CALLED TO, READ BACK BY AND VERIFIED WITH: PHARMD MADELEINE MITCHELL ON 04/09/22 @ 1625 BY DRT    Staphylococcus aureus (BCID) DETECTED (A) NOT DETECTED Final    Comment: CRITICAL RESULT CALLED TO, READ BACK BY AND VERIFIED WITH: PHARMD MADELEINE MITCHELL ON 04/09/22 @ 1625 BY DRT     Staphylococcus epidermidis NOT DETECTED NOT DETECTED Final  Staphylococcus lugdunensis NOT DETECTED NOT DETECTED Final   Streptococcus species NOT DETECTED NOT DETECTED Final   Streptococcus agalactiae NOT DETECTED NOT DETECTED Final   Streptococcus pneumoniae NOT DETECTED NOT DETECTED Final   Streptococcus pyogenes NOT DETECTED NOT DETECTED Final   A.calcoaceticus-baumannii NOT DETECTED NOT DETECTED Final   Bacteroides fragilis NOT DETECTED NOT DETECTED Final   Enterobacterales NOT DETECTED NOT DETECTED Final   Enterobacter cloacae complex NOT DETECTED NOT DETECTED Final   Escherichia coli NOT DETECTED NOT DETECTED Final   Klebsiella aerogenes NOT DETECTED NOT DETECTED Final   Klebsiella oxytoca NOT DETECTED NOT DETECTED Final   Klebsiella pneumoniae NOT DETECTED NOT DETECTED Final   Proteus species NOT DETECTED NOT DETECTED Final   Salmonella species NOT DETECTED NOT DETECTED Final   Serratia marcescens NOT DETECTED NOT DETECTED Final   Haemophilus influenzae NOT DETECTED NOT DETECTED Final   Neisseria meningitidis NOT DETECTED NOT DETECTED Final   Pseudomonas aeruginosa NOT DETECTED NOT DETECTED Final   Stenotrophomonas maltophilia NOT DETECTED NOT DETECTED Final   Candida albicans NOT DETECTED NOT DETECTED Final   Candida auris NOT DETECTED NOT DETECTED Final   Candida glabrata NOT DETECTED NOT DETECTED Final   Candida krusei NOT DETECTED NOT DETECTED Final   Candida parapsilosis NOT DETECTED NOT DETECTED Final   Candida tropicalis NOT DETECTED NOT DETECTED Final   Cryptococcus neoformans/gattii NOT DETECTED NOT DETECTED Final   Meth resistant mecA/C and MREJ NOT DETECTED NOT DETECTED Final    Comment: Performed at [facility], [address] [place], [place] [address]  Min Inhibitory Conc (2 Drugs)     Status: Abnormal   Collection Time: 04/08/22  7:12 PM  Result Value Ref Range Status   Min Inhibitory Conc (2 Drugs) Preliminary report (A)  Final    Comment:  (NOTE) Performed At: [facility] [place] [address] [place], [place] [id number] Jolene SchimkeNagendra Sanjai MD XB:[id number]:[phone]    Source (MIC2) [id number]  Final    Comment: Performed at [facility], [address] [place], [place] [address]  MIC Results (2 Drugs)     Status: Abnormal   Collection Time: 04/08/22  7:12 PM  Result Value Ref Range Status   RESULT 5 MIC (RESULT 1)                Base Name: Comment (A)  Final    Comment: (NOTE) Methicillin - resistant Staphylococcus aureus Identification performed by account, not confirmed by this laboratory. Performed At: [facility] [place] [address] [place], [place] [id number] Jolene SchimkeNagendra Sanjai MD KG:[id number]:[phone]   SARS Coronavirus 2 by RT PCR (hospital order, performed in [facility] hospital lab) *cepheid single result test* Anterior Nasal Swab     Status: None   Collection Time: 04/08/22  8:52 PM   Specimen: Anterior Nasal Swab  Result Value Ref Range Status   SARS Coronavirus 2 by RT PCR NEGATIVE NEGATIVE Final    Comment: (NOTE) SARS-CoV-2 target nucleic acids are NOT DETECTED.  The SARS-CoV-2 RNA is generally detectable in upper and lower respiratory specimens during the acute phase of infection. The lowest concentration of SARS-CoV-2 viral copies this assay can detect is 250 copies / mL. A negative result does not preclude SARS-CoV-2 infection and should not be used as the sole basis for treatment or other patient management decisions.  A negative result may occur with improper specimen collection / handling, submission of specimen other than nasopharyngeal swab, presence of viral mutation(s) within the areas targeted by this  assay, and inadequate number of viral copies (<250 copies / mL). A negative result must be combined with clinical observations, patient history, and epidemiological information.  Fact Sheet for Patients:   RoadLapTop.co.za  Fact Sheet for Healthcare  Providers: http://kim-miller.com/  This test is not yet approved or  cleared by the [place] FDA and has been authorized for detection and/or diagnosis of SARS-CoV-2 by FDA under an Emergency Use Authorization (EUA).  This EUA will remain in effect (meaning this test can be used) for the duration of the COVID-19 declaration under Section 564(b)(1) of the Act, 21 U.S.C. section 360bbb-3(b)(1), unless the authorization is terminated or revoked sooner.  Performed at [facility], [address] [place], [place] [address]   Culture, blood (Routine X 2) w Reflex to ID Panel     Status: None (Preliminary result)   Collection Time: 04/11/22 10:05 AM   Specimen: BLOOD RIGHT ARM  Result Value Ref Range Status   Specimen Description BLOOD RIGHT ARM  Final   Special Requests   Final    BOTTLES DRAWN AEROBIC AND ANAEROBIC Blood Culture results may not be optimal due to an inadequate volume of blood received in culture bottles   Culture   Final    NO GROWTH 4 DAYS Performed at [facility], [address], [place], [place] [address]    Report Status PENDING  Incomplete  Culture, blood (Routine X 2) w Reflex to ID Panel     Status: None (Preliminary result)   Collection Time: 04/11/22 10:05 AM   Specimen: BLOOD LEFT ARM  Result Value Ref Range Status   Specimen Description BLOOD LEFT ARM  Final   Special Requests   Final    BOTTLES DRAWN AEROBIC AND ANAEROBIC Blood Culture results may not be optimal due to an inadequate volume of blood received in culture bottles   Culture   Final    NO GROWTH 4 DAYS Performed at [facility], [address], [place], [place] [address]    Report Status PENDING  Incomplete    RADIOLOGY STUDIES/RESULTS: No results found.   LOS: 49 days   Signature  Huey Bienenstock M.D on 04/15/2022 at 12:47 PM   -  To page go to [email or web address]

## 2022-04-15 NOTE — Progress Notes (Signed)
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  No complaints.    Physical Exam: Constitutional:  Vitals:   04/15/22 0922 04/15/22 1300  BP:  97/69  Pulse:  83  Resp:  16  Temp:  97.7 F (36.5 C)  SpO2: 94% 94%   patient appears in NAD Respiratory: Normal respiratory effort  Review of Systems: Constitutional: negative for fevers and chills  Lab Results  Component Value Date   WBC 5.0 04/15/2022   HGB 9.5 (L) 04/15/2022   HCT 29.7 (L) 04/15/2022   MCV 84.4 04/15/2022   PLT 151 04/15/2022    Lab Results  Component Value Date   CREATININE 0.93 04/14/2022   BUN 13 04/14/2022   NA 136 04/14/2022   K 3.7 04/14/2022   CL 102 04/14/2022   CO2 24 04/14/2022    Lab Results  Component Value Date   ALT 59 (H) 04/14/2022   AST 31 04/14/2022   GGT 27 12/24/2021   ALKPHOS 73 04/14/2022     Microbiology: Recent Results (from the past 240 hour(s))  Culture, blood (Routine X 2) w Reflex to ID Panel     Status: Abnormal   Collection Time: 04/08/22  7:12 PM   Specimen: BLOOD  Result Value Ref Range Status   Specimen Description BLOOD BLOOD LEFT FOREARM  Final   Special Requests   Final    BOTTLES DRAWN AEROBIC AND ANAEROBIC Blood Culture results may not be optimal due to an inadequate volume of blood received in culture bottles ARPEDB USED AS AEROBIC BOTTLE   Culture  Setup Time   Final    GRAM POSITIVE COCCI IN CLUSTERS IN BOTH AEROBIC AND ANAEROBIC BOTTLES CRITICAL RESULT CALLED TO, READ BACK BY AND VERIFIED WITH: PHARMD Isabel Caprice [id number] @1728  FH    Culture (A)  Final    STAPHYLOCOCCUS AUREUS SUSCEPTIBILITIES PERFORMED ON PREVIOUS CULTURE WITHIN THE LAST 5 DAYS. Performed at [facility], [address] [place], [place]    Report Status 04/12/2022 FINAL  Final  Culture, blood (Routine X 2) w Reflex to ID Panel      Status: Abnormal (Preliminary result)   Collection Time: 04/08/22  7:12 PM   Specimen: BLOOD  Result Value Ref Range Status   Specimen Description BLOOD BLOOD LEFT FOREARM  Final   Special Requests   Final    BOTTLES DRAWN AEROBIC AND ANAEROBIC Blood Culture results may not be optimal due to an inadequate volume of blood received in culture bottles ARPEDB USED AS AEROBIC   Culture  Setup Time   Final    GRAM POSITIVE COCCI IN CLUSTERS IN BOTH AEROBIC AND ANAEROBIC BOTTLES CRITICAL RESULT CALLED TO, READ BACK BY AND VERIFIED WITH: PHARMD MADELEINE MITCHELL ON 04/09/22 @ 1625 BY DRT    Culture (A)  Final    METHICILLIN RESISTANT STAPHYLOCOCCUS AUREUS RETEST CONFIRMED MODIFICATION OF PBP (MECA) Sent to [facility] for further susceptibility testing. Performed at [facility], [address], [place], [place]    Report Status PENDING  Incomplete   Organism ID, Bacteria METHICILLIN RESISTANT STAPHYLOCOCCUS AUREUS  Final      Susceptibility   Methicillin resistant staphylococcus aureus - MIC*    CIPROFLOXACIN <=0.5 SENSITIVE Sensitive     ERYTHROMYCIN >=8 RESISTANT Resistant     GENTAMICIN <=0.5 SENSITIVE Sensitive     OXACILLIN RESISTANT  Resistant     TETRACYCLINE <=1 SENSITIVE Sensitive     VANCOMYCIN 2 SENSITIVE Sensitive     TRIMETH/SULFA <=10 SENSITIVE Sensitive     CLINDAMYCIN <=0.25 SENSITIVE Sensitive     RIFAMPIN <=0.5 SENSITIVE Sensitive     Inducible Clindamycin NEGATIVE Sensitive     * METHICILLIN RESISTANT STAPHYLOCOCCUS AUREUS  Blood Culture ID Panel (Reflexed)     Status: Abnormal   Collection Time: 04/08/22  7:12 PM  Result Value Ref Range Status   Enterococcus faecalis NOT DETECTED NOT DETECTED Final   Enterococcus Faecium NOT DETECTED NOT DETECTED Final   Listeria monocytogenes NOT DETECTED NOT DETECTED Final   Staphylococcus species DETECTED (A) NOT DETECTED Final    Comment: CRITICAL RESULT CALLED TO, READ BACK BY AND VERIFIED WITH: PHARMD MADELEINE  MITCHELL ON 04/09/22 @ 1625 BY DRT    Staphylococcus aureus (BCID) DETECTED (A) NOT DETECTED Final    Comment: CRITICAL RESULT CALLED TO, READ BACK BY AND VERIFIED WITH: PHARMD MADELEINE MITCHELL ON 04/09/22 @ 1625 BY DRT    Staphylococcus epidermidis NOT DETECTED NOT DETECTED Final   Staphylococcus lugdunensis NOT DETECTED NOT DETECTED Final   Streptococcus species NOT DETECTED NOT DETECTED Final   Streptococcus agalactiae NOT DETECTED NOT DETECTED Final   Streptococcus pneumoniae NOT DETECTED NOT DETECTED Final   Streptococcus pyogenes NOT DETECTED NOT DETECTED Final   A.calcoaceticus-baumannii NOT DETECTED NOT DETECTED Final   Bacteroides fragilis NOT DETECTED NOT DETECTED Final   Enterobacterales NOT DETECTED NOT DETECTED Final   Enterobacter cloacae complex NOT DETECTED NOT DETECTED Final   Escherichia coli NOT DETECTED NOT DETECTED Final   Klebsiella aerogenes NOT DETECTED NOT DETECTED Final   Klebsiella oxytoca NOT DETECTED NOT DETECTED Final   Klebsiella pneumoniae NOT DETECTED NOT DETECTED Final   Proteus species NOT DETECTED NOT DETECTED Final   Salmonella species NOT DETECTED NOT DETECTED Final   Serratia marcescens NOT DETECTED NOT DETECTED Final   Haemophilus influenzae NOT DETECTED NOT DETECTED Final   Neisseria meningitidis NOT DETECTED NOT DETECTED Final   Pseudomonas aeruginosa NOT DETECTED NOT DETECTED Final   Stenotrophomonas maltophilia NOT DETECTED NOT DETECTED Final   Candida albicans NOT DETECTED NOT DETECTED Final   Candida auris NOT DETECTED NOT DETECTED Final   Candida glabrata NOT DETECTED NOT DETECTED Final   Candida krusei NOT DETECTED NOT DETECTED Final   Candida parapsilosis NOT DETECTED NOT DETECTED Final   Candida tropicalis NOT DETECTED NOT DETECTED Final   Cryptococcus neoformans/gattii NOT DETECTED NOT DETECTED Final   Meth resistant mecA/C and MREJ NOT DETECTED NOT DETECTED Final    Comment: Performed at [facility], [address]  [place], [place] [address]  Min Inhibitory Conc (2 Drugs)     Status: Abnormal   Collection Time: 04/08/22  7:12 PM  Result Value Ref Range Status   Min Inhibitory Conc (2 Drugs) Preliminary report (A)  Final    Comment: (NOTE) Performed At: [facility] [address] [place], [place] [id number] Jolene Schimke MD AO:[id number]    Source (MIC2) [phone]  Final    Comment: Performed at [facility], [address] [place], [place] [address]  MIC Results (2 Drugs)     Status: Abnormal   Collection Time: 04/08/22  7:12 PM  Result Value Ref Range Status   RESULT 5 MIC (RESULT 1)                Base Name: Comment (A)  Final    Comment: (  NOTE) Methicillin - resistant Staphylococcus aureus Identification performed by account, not confirmed by this laboratory. Performed At: [facility] [address] [place], [place] [id number] Jolene Schimke MD WN:[id number]   SARS Coronavirus 2 by RT PCR (hospital order, performed in [facility] hospital lab) *cepheid single result test* Anterior Nasal Swab     Status: None   Collection Time: 04/08/22  8:52 PM   Specimen: Anterior Nasal Swab  Result Value Ref Range Status   SARS Coronavirus 2 by RT PCR NEGATIVE NEGATIVE Final    Comment: (NOTE) SARS-CoV-2 target nucleic acids are NOT DETECTED.  The SARS-CoV-2 RNA is generally detectable in upper and lower respiratory specimens during the acute phase of infection. The lowest concentration of SARS-CoV-2 viral copies this assay can detect is 250 copies / mL. A negative result does not preclude SARS-CoV-2 infection and should not be used as the sole basis for treatment or other patient management decisions.  A negative result may occur with improper specimen collection / handling, submission of specimen other than nasopharyngeal swab, presence of viral mutation(s) within the areas targeted by this assay, and inadequate number of viral copies (<250 copies / mL). A negative result must be  combined with clinical observations, patient history, and epidemiological information.  Fact Sheet for Patients:   RoadLapTop.co.za  Fact Sheet for Healthcare Providers: http://kim-miller.com/  This test is not yet approved or  cleared by the [place] FDA and has been authorized for detection and/or diagnosis of SARS-CoV-2 by FDA under an Emergency Use Authorization (EUA).  This EUA will remain in effect (meaning this test can be used) for the duration of the COVID-19 declaration under Section 564(b)(1) of the Act, 21 U.S.C. section 360bbb-3(b)(1), unless the authorization is terminated or revoked sooner.  Performed at [facility], [address] [place], [place] [address]   Culture, blood (Routine X 2) w Reflex to ID Panel     Status: None (Preliminary result)   Collection Time: 04/11/22 10:05 AM   Specimen: BLOOD RIGHT ARM  Result Value Ref Range Status   Specimen Description BLOOD RIGHT ARM  Final   Special Requests   Final    BOTTLES DRAWN AEROBIC AND ANAEROBIC Blood Culture results may not be optimal due to an inadequate volume of blood received in culture bottles   Culture   Final    NO GROWTH 4 DAYS Performed at [facility], [address], [place], [place] [address]    Report Status PENDING  Incomplete  Culture, blood (Routine X 2) w Reflex to ID Panel     Status: None (Preliminary result)   Collection Time: 04/11/22 10:05 AM   Specimen: BLOOD LEFT ARM  Result Value Ref Range Status   Specimen Description BLOOD LEFT ARM  Final   Special Requests   Final    BOTTLES DRAWN AEROBIC AND ANAEROBIC Blood Culture results may not be optimal due to an inadequate volume of blood received in culture bottles   Culture   Final    NO GROWTH 4 DAYS Performed at [facility], 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  Most recent blood cultures though have remained negative.  Plan for valve replacement noted.   Continue with current antibiotics.  2.  Chronic hepatitis C - will  consider treatment as an outpatient after discharge.    3.  Poor dentition - will likely need dental extraction prior to surgery.

## 2022-04-15 NOTE — Consult Note (Signed)
[facility] Department of Dental Medicine      INPATIENT CONSULTATION    PLAN/RECOMMENDATIONS     ASSESSMENT: There are no current signs of acute odontogenic infection including abscess, edema or erythema, or suspicious lesion requiring biopsy.   Rampant decay in all 4 quadrants; retained root tips, chronic periapical infection, accretions on teeth     RECOMMENDATIONS: Extractions of all remaining teeth to decrease the risk of perioperative and postoperative systemic infection and complications. Establish dental care at an outside office of the patient's choice for routine care including replacement of missing teeth as necessary following heart surgery and medical optimization.     PLAN: O.R. for full mouth extractions under general anesthesia later this week (either Thursday 9/14 or Friday 9/15).  Discussed plan with medical team and either of these dates will work for extractions. Lovenox will need to be held 24 hours prior to scheduled dental surgery and resumed 24 hours after.  Discussed in detail all treatment options and recommendations with the patient and they are agreeable to the plan.   Thank you for consulting with Hospital Dentistry and for the opportunity to participate in this patient's treatment.  Should you have any questions or concerns, please contact the [facility] at [phone].    Service Date:   04/15/2022 Admit Date:   02/25/2022  Patient Name:   Reginald Clarke Date of Birth:   1990-09-11 Medical Record Number: [id number]  Referring Provider:          Brynda Greathouse   HISTORY OF PRESENT ILLNESS: Reginald Show is a very pleasant 31 y.o. 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   The patient is anticipating TV replacement this admission. Hospital dentistry was consulted to complete a medically-necessary dental evaluation as part of the patient's pre-cardiac surgery work-up.   DENTAL HISTORY: The patient reports that he does not have a dentist that he sees regularly. He says that he occasionally does have some tooth pain and swelling that is spontaneous and usually resolves on its own. He uses Orogel to help with the pain when this happens. He reports being interested in dentures to replace missing teeth and says that he thinks the rest of his teeth need to come out.  He currently denies any dental/orofacial pain or sensitivity. Patient is able to manage oral secretions.  Patient denies dysphagia, odynophagia and dysphonia.   CHIEF COMPLAINT:  Preoperative dental consult.   Patient Active Problem List   Diagnosis Date Noted   Thrombocytopenia ([name])    Pneumonia of both lower lobes due to methicillin resistant Staphylococcus aureus (MRSA) ([name])    IVDU (intravenous drug user)    MSSA bacteremia 02/28/2022   Hypokalemia 02/27/2022   Hypomagnesemia 02/27/2022   Hyponatremia 02/27/2022   Subacute bacterial endocarditis 02/27/2022   Sepsis ([name])    History of bacterial endocarditis    Elevated serum creatinine    Normocytic anemia 12/26/2021   Prolonged QT interval 12/26/2021   Fever 12/25/2021   Smoking 12/25/2021   Anxiety 12/05/2021   Septic pulmonary embolism ([name]) 11/25/2021   Hepatitis C, chronic ([name]) 11/24/2021   Polysubstance abuse ([name]) 11/17/2021   Tobacco abuse 11/15/2021   Endocarditis of tricuspid valve 11/15/2021   MRSA bacteremia 11/14/2021   Chronic pain syndrome/methadone abuse  11/14/2021   GERD (  gastroesophageal reflux disease) 11/14/2021   Past Medical History:  Diagnosis Date   Anxiety    Endocarditis    Hepatitis    hx of hep C per pt   Opiate dependence ([name])    on methadone   Past Surgical History:  Procedure Laterality  Date   APPLICATION OF ANGIOVAC N/A 11/28/2021   Procedure: APPLICATION OF ANGIOVAC;  Surgeon: Corliss Skains, MD;  Location: [facility] OR;  Service: Vascular;  Laterality: N/A;   TEE WITHOUT CARDIOVERSION N/A 11/23/2021   Procedure: TRANSESOPHAGEAL ECHOCARDIOGRAM (TEE);  Surgeon: Christell Constant, MD;  Location: [facility] ENDOSCOPY;  Service: Cardiovascular;  Laterality: N/A;   TEE WITHOUT CARDIOVERSION N/A 11/28/2021   Procedure: TRANSESOPHAGEAL ECHOCARDIOGRAM (TEE);  Surgeon: Corliss Skains, MD;  Location: [facility] OR;  Service: Open Heart Surgery;  Laterality: N/A;   TEE WITHOUT CARDIOVERSION N/A 03/07/2022   Procedure: TRANSESOPHAGEAL ECHOCARDIOGRAM (TEE);  Surgeon: Christell Constant, MD;  Location: [facility] ENDOSCOPY;  Service: Cardiovascular;  Laterality: N/A;   tubes in ears     WRIST FRACTURE SURGERY     No Known Allergies Current Facility-Administered Medications  Medication Dose Route Frequency Provider Last Rate Last Admin   acetaminophen (TYLENOL) tablet 650 mg  650 mg Oral Q8H PRN Jonah Blue, MD   650 mg at 04/13/22 2133   ALPRAZolam (XANAX) tablet 0.25 mg  0.25 mg Oral BID PRN Alberteen Sam, MD   0.25 mg at 04/15/22 0923   alum & mag hydroxide-simeth (MAALOX/MYLANTA) 200-200-20 MG/5ML suspension 15 mL  15 mL Oral Q6H PRN Dorcas Carrow, MD   15 mL at 04/10/22 2140   bisacodyl (DULCOLAX) EC tablet 5 mg  5 mg Oral Daily PRN Jonah Blue, MD       ceftaroline (TEFLARO) 600 mg in sodium chloride 0.9 % 100 mL IVPB  600 mg Intravenous Q8H Daiva Eves, Lisette Grinder, MD 100 mL/hr at 04/15/22 1303 600 mg at 04/15/22 1303   DAPTOmycin (CUBICIN) 650 mg in sodium chloride 0.9 % IVPB  8 mg/kg Intravenous Q2000 Paytes, Austin A, RPH   Stopping previously hung infusion at 04/15/22 0819   diclofenac Sodium (VOLTAREN) 1 % topical gel 2 g  2 g Topical TID PRN Leroy Sea, MD       docusate sodium (COLACE) capsule 100 mg  100 mg Oral Daily PRN Dorcas Carrow, MD       enoxaparin  (LOVENOX) injection 40 mg  40 mg Subcutaneous Q24H Jonah Blue, MD   40 mg at 03/30/22 1659   escitalopram (LEXAPRO) tablet 10 mg  10 mg Oral Daily Alberteen Sam, MD   10 mg at 04/15/22 0923   feeding supplement (ENSURE ENLIVE / ENSURE PLUS) liquid 237 mL  237 mL Oral BID BM Dorcas Carrow, MD   237 mL at 04/15/22 1404   hydrOXYzine (ATARAX) tablet 25 mg  25 mg Oral TID PRN Alberteen Sam, MD   25 mg at 03/01/22 1650   ibuprofen (ADVIL) tablet 600 mg  600 mg Oral Q6H PRN Leroy Sea, MD   600 mg at 04/14/22 1801   loperamide (IMODIUM) capsule 2 mg  2 mg Oral PRN Maretta Bees, MD   2 mg at 03/30/22 1153   methadone (DOLOPHINE) tablet 135 mg  135 mg Oral Daily Jonah Blue, MD   135 mg at 04/15/22 0923   methocarbamol (ROBAXIN) tablet 500 mg  500 mg Oral Q8H PRN Jonah Blue, MD   500 mg at 03/19/22 2124  multivitamin with minerals tablet 1 tablet  1 tablet Oral Daily Pokhrel, Laxman, MD   1 tablet at 04/15/22 [id number]   nicotine (NICODERM CQ - dosed in mg/24 hours) patch 14 mg  14 mg Transdermal Daily Jonah BlueYates, Jennifer, MD       polyethylene glycol (MIRALAX / GLYCOLAX) packet 17 g  17 g Oral Daily PRN Jonah BlueYates, Jennifer, MD       promethazine (PHENERGAN) tablet 12.5 mg  12.5 mg Oral Q6H PRN Dorcas CarrowGhimire, Kuber, MD   12.5 mg at 04/15/22 [id number]   sodium chloride 0.9 % bolus 500 mL  500 mL Intravenous Once PRN Luiz Ironaniels, James K, NP       sodium chloride flush (NS) 0.9 % injection 10-40 mL  10-40 mL Intracatheter PRN Dorcas CarrowGhimire, Kuber, MD   10 mL at 04/04/22 2004   traZODone (DESYREL) tablet 50 mg  50 mg Oral Leilani AbleQHS Ghimire, Kuber, MD   50 mg at 04/14/22 2130    LABS: Lab Results  Component Value Date   WBC 5.0 04/15/2022   HGB 9.5 (L) 04/15/2022   HCT 29.7 (L) 04/15/2022   MCV 84.4 04/15/2022   PLT 151 04/15/2022      Component Value Date/Time   NA 136 04/14/2022 0224   K 3.7 04/14/2022 0224   CL 102 04/14/2022 0224   CO2 24 04/14/2022 0224   GLUCOSE 98 04/14/2022 0224    BUN 13 04/14/2022 0224   CREATININE 0.93 04/14/2022 0224   CALCIUM 9.0 04/14/2022 0224   GFRNONAA >60 04/14/2022 0224   Lab Results  Component Value Date   INR 1.1 04/06/2022   INR 1.2 04/01/2022   INR 1.3 (H) 02/25/2022   No results found for: "PTT"  Social History   Socioeconomic History   Marital status: Single    Spouse name: Not on file   Number of children: Not on file   Years of education: Not on file   Highest education level: Not on file  Occupational History   Occupation: unemployed  Tobacco Use   Smoking status: Every Day    Packs/day: 1.00    Years: 19.00    Total pack years: 19.00    Types: Cigarettes   Smokeless tobacco: Never   Tobacco comments:    Currently 5 cigs/day  Vaping Use   Vaping Use: Never used  Substance and Sexual Activity   Alcohol use: No   Drug use: Yes    Types: Methamphetamines, Marijuana, Heroin    Comment: last use in April - meth; no heroin in 3 years; occasional marijuana   Sexual activity: Yes    Birth control/protection: None  Other Topics Concern   Not on file  Social History Narrative   Not on file   Social Determinants of Health   Financial Resource Strain: Not on file  Food Insecurity: Not on file  Transportation Needs: Not on file  Physical Activity: Not on file  Stress: Not on file  Social Connections: Not on file  Intimate Partner Violence: Not on file   History reviewed. No pertinent family history.   REVIEW OF SYSTEMS:  Reviewed with the patient as per HPI. Psych:  [+] Dental phobia     VITAL SIGNS: BP 97/69 (BP Location: Right Arm)   Pulse 83   Temp 97.7 F (36.5 C) (Oral)   Resp 16   Ht 5\' 10"  (1.778 m)   Wt 81.5 kg   SpO2 94%   BMI 25.78 kg/m      PHYSICAL EXAM:  General:  Well-developed, comfortable and in no apparent distress. Neurological:  Alert and oriented to person, place and  time. Extraoral:  Facial symmetry present without any edema or erythema.  No swelling or lymphadenopathy.   TMJ asymptomatic without clicks or crepitations. Intraoral:  Soft tissues appear well-perfused and mucous membranes moist.  FOM and vestibules soft and not raised. Oral cavity without mass or lesion. No signs of infection, parulis, sinus tract, edema or erythema evident upon exam.    DENTAL EXAM: Clinical findings charted.    OVERALL IMPRESSION:  Poor remaining dentition.       ORAL HYGIENE:  Poor  Periodontal:  Inflamed and erythematous gingival tissue.   Generalized plaque and calculus accumulation. Caries:  Rampant decay in all 4 quadrants Retained root tips:  #2, #4, #10, #15, #20, #29 Prosthodontics:  Patient denies any history of wearing partial dentures. Occlusion:  Unable to assess molar occlusion. Non-functional teeth:  #1, #5, #12, #16 Other findings:   [+]  Impacted teeth:  #1   RADIOGRAPHIC EXAM:   04/11/2022 Orthopantogram was reviewed and interpreted. Condyles seated bilaterally in fossas.  No evidence of abnormal pathology.  All visualized osseous structures appear WNL.  #1 & #16 are present; #1 appears to be impacted mesial (bony). Missing teeth #'s 3, 7, 13, 14, 17, 18, 19, 23, 27, 28, 30, 31 & 32. Periapical radiolucencies noted on teeth numbers 2, 4, 5, 9, 10, 11, 12, #15, 20, 24 & 29. Retained root tips- #2, #4, #10, #15, #20 & #29. Caries- teeth #'s 5, 6, 8, 9, 11, 12, 16 & 22 have deep decay approximating the pulp; #1O, #10M&D, #25MI, #54M.   ASSESSMENT:  1.  Recurrent tricuspid valve endocarditis 2.  Inpatient preoperative dental evaluation 3.  Caries 4.  Retained root tips 5.  Chronic apical periodontitis 6.  Chronic periodontitis 7.  Accretions on teeth 8.  Missing teeth 9.  Impacted 3rd molar (#1) 10.  Postoperative bleeding risk (on Lovenox this admission) 11.  Dental Phobia   PLAN AND RECOMMENDATIONS: I discussed the risks, benefits, and complications of various scenarios with the patient in relationship to their medical and dental conditions, which  included systemic infection such as (recurrent) endocarditis, bacteremia or other serious issues that could potentially occur either before, during or after the patient's anticipated heart surgery if dental/oral concerns are not addressed.  I explained that if any chronic or acute dental/oral infection(s) are addressed and subsequently not maintained following medical optimization and recovery, their risk of the previously mentioned complications are just as high and could potentially occur postoperatively.  I explained all significant findings of the dental consultation with the patient including very poor remaining teeth with severe decay on almost all teeth, retained root tips and several teeth that are chronically infected, and the recommended care including extractions of all remaining dentition except for impacted 3rd molar #1 in order to optimize them for heart surgery from a dental standpoint. I discussed that his wisdom tooth on the top right is impacted in his bone and does not show signs of severe decay or periapical infection on his X-ray, so at this point he will likely never have an issue with it or even notice it is there. I explained that if for some reason it does become problematic in the future, a referral to an oral surgeon would be necessary due to the proximity of the maxillary sinus.  The patient verbalized understanding of all findings, discussion, and recommendations. We then discussed various  treatment options to include no treatment, multiple extractions with alveoloplasty, pre-prosthetic surgery as indicated, periodontal therapy, dental restorations, root canal therapy, crown and bridge therapy, implant therapy, and replacement of missing teeth as indicated.  The patient verbalized understanding of all options, and currently wishes to proceed with full mouth extractions with alveoloplasty as needed.  We discussed all treatment will be completed in the operating room under general  anesthesia due to the patient's current medical conditions, postoperative bleeding risk and dental phobia.  The patient is agreeable to the plan.  O.R. for all dental treatment later this week (either Thursday 9/14 or Friday 9/15).  Discussed findings and recommendations with medical team and the patient's TV replacement will be next week so either date will work.   Lovenox will need to be held 24 hours prior to scheduled dental surgery and resumed 24 hours after.  The patient will need to establish care at a dental office of his choice for routine dental care including replacement of missing teeth as needed.  Recommend the patient discuss plans to return to the dentist for routine care with medical team following heart surgery for new antibiotic prophylaxis recommendations.  All questions and concerns were invited and addressed.  The patient tolerated today's visit well.   Alda Berthold, DMD

## 2022-04-16 DIAGNOSIS — K036 Deposits [accretions] on teeth: Secondary | ICD-10-CM | POA: Diagnosis not present

## 2022-04-16 DIAGNOSIS — B182 Chronic viral hepatitis C: Secondary | ICD-10-CM | POA: Diagnosis not present

## 2022-04-16 DIAGNOSIS — I079 Rheumatic tricuspid valve disease, unspecified: Secondary | ICD-10-CM | POA: Diagnosis not present

## 2022-04-16 LAB — CBC WITH DIFFERENTIAL/PLATELET
Abs Immature Granulocytes: 0.03 10*3/uL (ref 0.00–0.07)
Basophils Absolute: 0 10*3/uL (ref 0.0–0.1)
Basophils Relative: 0 %
Eosinophils Absolute: 0.2 10*3/uL (ref 0.0–0.5)
Eosinophils Relative: 3 %
HCT: 25.9 % — ABNORMAL LOW (ref 39.0–52.0)
Hemoglobin: 8.3 g/dL — ABNORMAL LOW (ref 13.0–17.0)
Immature Granulocytes: 0 %
Lymphocytes Relative: 25 %
Lymphs Abs: 1.8 10*3/uL (ref 0.7–4.0)
MCH: 26.9 pg (ref 26.0–34.0)
MCHC: 32 g/dL (ref 30.0–36.0)
MCV: 83.8 fL (ref 80.0–100.0)
Monocytes Absolute: 0.6 10*3/uL (ref 0.1–1.0)
Monocytes Relative: 8 %
Neutro Abs: 4.5 10*3/uL (ref 1.7–7.7)
Neutrophils Relative %: 64 %
Platelets: 179 10*3/uL (ref 150–400)
RBC: 3.09 MIL/uL — ABNORMAL LOW (ref 4.22–5.81)
RDW: 16.6 % — ABNORMAL HIGH (ref 11.5–15.5)
WBC: 7.1 10*3/uL (ref 4.0–10.5)
nRBC: 0 % (ref 0.0–0.2)

## 2022-04-16 LAB — CULTURE, BLOOD (ROUTINE X 2)
Culture: NO GROWTH
Culture: NO GROWTH

## 2022-04-16 LAB — MAGNESIUM: Magnesium: 2.1 mg/dL (ref 1.7–2.4)

## 2022-04-16 MED ORDER — ENOXAPARIN SODIUM 40 MG/0.4ML IJ SOSY
40.0000 mg | PREFILLED_SYRINGE | INTRAMUSCULAR | Status: AC
  Administered 2022-04-17: 40 mg via SUBCUTANEOUS
  Filled 2022-04-16: qty 0.4

## 2022-04-16 NOTE — Progress Notes (Signed)
PROGRESS NOTE        PATIENT DETAILS Name: Reginald Clarke Age: 31 y.o. Sex: male Date of Birth: 1991-02-22 Admit Date: 02/25/2022 Admitting Physician Dorcas Carrow, MD JWJ:[id number], [name], PA-C  Brief Summary:  Patient is a 31 y.o.  male with history of recurrent MSSA native tricuspid valve endocarditis  Significant events:  4/12-5/10>> hospitalization for MSSA bacteremia with native tricuspid valve endocarditis.  Underwent angio vac procedure on 4/26.Completed cefazolin on 5/10-planned weekly dalbavancin at the ID clinic.   5/22>> outpatient blood culture positive for MSSA 5/24-6/8>> admit to [facility] for persistent bacteremia-worsening back pain-discharge to [facility] on IV Ancef. 7/5>> discharged from [facility] on oral cefdinir.  Continued to have intermittent fever postdischarge.   7/24>> admit for-persistent fever/nausea/vomiting/diarrhea-blood cultures positive for MSSA. 9/5>> supposed to finish IV antibiotic treatment 9/6, but unfortunately he spiked high fever to 103, where his blood culture growing Staph aureus   Significant studies: 7/25>> CT chest: Multifocal airspace opacities-increasing cavitary nodules 7/25>> MRI thoracic spine: No evidence of infection in thoracic spine.  Significant microbiology data: 7/24>> urine culture: Negative 7/25>> blood culture: 1/2-MSSA. 7/27>> blood culture: No growth 7/29>> blood culture: No growth 9/5>> blood cultures Staph aureus 9/5 >> PICC line discontinued 9/7>> repeat blood cultures remains negative to date  Procedures:  8/3>> TEE: 3 vegetations on tricuspid valve-severe TR.  EF 60-65%  Consults: ID CT suregery Dentist   Subjective:  -No significant events overnight, patient denies any complaints.    Objective: Vitals: Blood pressure 103/69, pulse 100, temperature 98.1 F (36.7 C), temperature source Oral, resp. rate 16, height 5\' 10"  (1.778 m), weight 81.5 kg, SpO2 95 %.   Exam:  Awake  Alert, Oriented X 3, No new F.N deficits, Normal affect Poor dentition Symmetrical Chest wall movement, Good air movement bilaterally, CTAB RRR,No Gallops,Rubs or new Murmurs, No Parasternal Heave +ve B.Sounds, Abd Soft, No tenderness, No rebound - guarding or rigidity. No Cyanosis, Clubbing or edema, No new Rash or bruise           Assessment/Plan:  Sepsis due to recurrent MSSA bacteremia with native tricuspid valve endocarditis: - treated with  6 weeks of IV antibiotics, with plan stop date 9/6 . -Unfortunately he spiked another fever, repeat blood culture showing Staphylococcus aureus, he remains currently on daptomycin and ceftaroline to cover for MSSA and MRSA for tricuspid valve endocarditis. -CT surgery consulted, likely will need TV surgery  after dental clearance -Dentist consulted 9/6.  Orthopantogram done, showing multiple prior dental extractions with dental caries throughout remaining teeth, with persistent peridental lucencies of multiple mandibular teeth with persistent periapical lucency versus abscess at left mandibular molar.,  Discussed with CT surgery, plan for possible surgical intervention after dentist clearance , plan for extraction of all remaining teeth -Repeat blood culture 9/4 obtained after removal of PICC line unfortunately growing MRSA from 2 out of 2 sites -Most recent blood cultures on 9/7 remains negative to date.  Normocytic anemia: Due to chronic disease/recurrent endocarditis: No indication for transfusion.  Anxiety: Stable-on Lexapro-Xanax be slowly weaned down.  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  Mild transaminitis.  -Trending down   Nutrition Status: Nutrition Problem: Inadequate oral intake Etiology: vomiting, nausea Signs/Symptoms: per patient/family report Interventions: Ensure Enlive (each supplement provides 350kcal and 20 grams of protein), MVI,  Snacks  BMI: Estimated body mass index is 25.78 kg/m as calculated from the following:   Height as of this encounter: 5\' 10"  (1.778 m).   Weight as of this encounter: 81.5 kg.   Code status:   Code Status: Full Code   DVT Prophylaxis: enoxaparin (LOVENOX) injection 40 mg Start: 04/17/22 1000   Family Communication: None at bedside   Disposition Plan: Status is: Inpatient    Diet: Diet Order             Diet regular Room service appropriate? Yes; Fluid consistency: Thin  Diet effective now                    MEDICATIONS: Scheduled Meds:  [START ON 04/17/2022] enoxaparin (LOVENOX) injection  40 mg Subcutaneous Q24H   escitalopram  10 mg Oral Daily   feeding supplement  237 mL Oral BID BM   methadone  135 mg Oral Daily   multivitamin with minerals  1 tablet Oral Daily   nicotine  14 mg Transdermal Daily   traZODone  50 mg Oral QHS   Continuous Infusions:  ceFTAROline (TEFLARO) IV 600 mg (04/16/22 1507)   DAPTOmycin (CUBICIN) 650 mg in sodium chloride 0.9 % IVPB 650 mg (04/15/22 2136)   sodium chloride     PRN Meds:.acetaminophen, ALPRAZolam, alum & mag hydroxide-simeth, bisacodyl, diclofenac Sodium, docusate sodium, hydrOXYzine, ibuprofen, loperamide, methocarbamol, polyethylene glycol, promethazine, sodium chloride, sodium chloride flush   I have personally reviewed following labs and imaging studies  LABORATORY DATA: Recent Labs  Lab 04/12/22 0304 04/13/22 1400 04/14/22 0224 04/15/22 0705 04/16/22 0712  WBC 7.2 6.1 7.2 5.0 7.1  HGB 8.3* 8.5* 9.0* 9.5* 8.3*  HCT 25.6* 26.7* 28.4* 29.7* 25.9*  PLT 100* 113* 153 151 179  MCV 83.9 84.8 84.0 84.4 83.8  MCH 27.2 27.0 26.6 27.0 26.9  MCHC 32.4 31.8 31.7 32.0 32.0  RDW 16.9* 17.0* 16.7* 16.6* 16.6*  LYMPHSABS 1.5 1.4 2.0 1.7 1.8  MONOABS 0.9 0.6 0.8 0.5 0.6  EOSABS 0.1 0.1 0.1 0.2 0.2  BASOSABS 0.0 0.0 0.0 0.0 0.0    Recent Labs  Lab 04/10/22 0802 04/11/22 0939 04/12/22 0304 04/13/22 1400  04/14/22 0224 04/15/22 0705 04/16/22 0712  NA 133* 135 134* 133* 136  --   --   K 4.5 3.7 3.6 5.2* 3.7  --   --   CL 101 101 99 99 102  --   --   CO2 25 25 25 22 24   --   --   GLUCOSE 103* 137* 99 91 98  --   --   BUN 18 10 15 13 13   --   --   CREATININE 0.81 0.87 0.79 0.82 0.93  --   --   CALCIUM 8.8* 9.0 8.6* 8.7* 9.0  --   --   AST 40 28 27 42* 31  --   --   ALT 83* 68* 58* 57* 59*  --   --   ALKPHOS 70 70 66 83 73  --   --   BILITOT 0.4 0.8 0.5 0.6 0.2*  --   --   ALBUMIN 2.7* 3.0* 2.7* 3.0* 2.9*  --   --   MG 1.7 2.0 1.9 2.0 2.0 2.3 2.1  PROCALCITON <0.10  --   --   --   --   --   --     MICROBIOLOGY: Recent Results (from the past 240 hour(s))  Culture, blood (Routine X 2) w Reflex to ID  Panel     Status: Abnormal   Collection Time: 04/08/22  7:12 PM   Specimen: BLOOD  Result Value Ref Range Status   Specimen Description BLOOD BLOOD LEFT FOREARM  Final   Special Requests   Final    BOTTLES DRAWN AEROBIC AND ANAEROBIC Blood Culture results may not be optimal due to an inadequate volume of blood received in culture bottles ARPEDB USED AS AEROBIC BOTTLE   Culture  Setup Time   Final    GRAM POSITIVE COCCI IN CLUSTERS IN BOTH AEROBIC AND ANAEROBIC BOTTLES CRITICAL RESULT CALLED TO, READ BACK BY AND VERIFIED WITH: PHARMD Isabel CapriceM. MITCHELL [phone] @1728  FH    Culture (A)  Final    STAPHYLOCOCCUS AUREUS SUSCEPTIBILITIES PERFORMED ON PREVIOUS CULTURE WITHIN THE LAST 5 DAYS. Performed at [facility], [address] [place], [place] [address]    Report Status 04/12/2022 FINAL  Final  Culture, blood (Routine X 2) w Reflex to ID Panel     Status: Abnormal (Preliminary result)   Collection Time: 04/08/22  7:12 PM   Specimen: BLOOD  Result Value Ref Range Status   Specimen Description BLOOD BLOOD LEFT FOREARM  Final   Special Requests   Final    BOTTLES DRAWN AEROBIC AND ANAEROBIC Blood Culture results may not be optimal due to an inadequate volume of blood received in culture  bottles ARPEDB USED AS AEROBIC   Culture  Setup Time   Final    GRAM POSITIVE COCCI IN CLUSTERS IN BOTH AEROBIC AND ANAEROBIC BOTTLES CRITICAL RESULT CALLED TO, READ BACK BY AND VERIFIED WITH: PHARMD MADELEINE MITCHELL ON 04/09/22 @ 1625 BY DRT    Culture (A)  Final    METHICILLIN RESISTANT STAPHYLOCOCCUS AUREUS RETEST CONFIRMED MODIFICATION OF PBP (MECA) Sent to [facility] for further susceptibility testing. Performed at [facility], [address] [place], [place] [address]    Report Status PENDING  Incomplete   Organism ID, Bacteria METHICILLIN RESISTANT STAPHYLOCOCCUS AUREUS  Final      Susceptibility   Methicillin resistant staphylococcus aureus - MIC*    CIPROFLOXACIN <=0.5 SENSITIVE Sensitive     ERYTHROMYCIN >=8 RESISTANT Resistant     GENTAMICIN <=0.5 SENSITIVE Sensitive     OXACILLIN RESISTANT Resistant     TETRACYCLINE <=1 SENSITIVE Sensitive     VANCOMYCIN 2 SENSITIVE Sensitive     TRIMETH/SULFA <=10 SENSITIVE Sensitive     CLINDAMYCIN <=0.25 SENSITIVE Sensitive     RIFAMPIN <=0.5 SENSITIVE Sensitive     Inducible Clindamycin NEGATIVE Sensitive     * METHICILLIN RESISTANT STAPHYLOCOCCUS AUREUS  Blood Culture ID Panel (Reflexed)     Status: Abnormal   Collection Time: 04/08/22  7:12 PM  Result Value Ref Range Status   Enterococcus faecalis NOT DETECTED NOT DETECTED Final   Enterococcus Faecium NOT DETECTED NOT DETECTED Final   Listeria monocytogenes NOT DETECTED NOT DETECTED Final   Staphylococcus species DETECTED (A) NOT DETECTED Final    Comment: CRITICAL RESULT CALLED TO, READ BACK BY AND VERIFIED WITH: PHARMD MADELEINE MITCHELL ON 04/09/22 @ 1625 BY DRT    Staphylococcus aureus (BCID) DETECTED (A) NOT DETECTED Final    Comment: CRITICAL RESULT CALLED TO, READ BACK BY AND VERIFIED WITH: PHARMD MADELEINE MITCHELL ON 04/09/22 @ 1625 BY DRT    Staphylococcus epidermidis NOT DETECTED NOT DETECTED Final   Staphylococcus lugdunensis NOT DETECTED NOT DETECTED Final    Streptococcus species NOT DETECTED NOT DETECTED Final   Streptococcus agalactiae NOT DETECTED NOT DETECTED Final  Streptococcus pneumoniae NOT DETECTED NOT DETECTED Final   Streptococcus pyogenes NOT DETECTED NOT DETECTED Final   A.calcoaceticus-baumannii NOT DETECTED NOT DETECTED Final   Bacteroides fragilis NOT DETECTED NOT DETECTED Final   Enterobacterales NOT DETECTED NOT DETECTED Final   Enterobacter cloacae complex NOT DETECTED NOT DETECTED Final   Escherichia coli NOT DETECTED NOT DETECTED Final   Klebsiella aerogenes NOT DETECTED NOT DETECTED Final   Klebsiella oxytoca NOT DETECTED NOT DETECTED Final   Klebsiella pneumoniae NOT DETECTED NOT DETECTED Final   Proteus species NOT DETECTED NOT DETECTED Final   Salmonella species NOT DETECTED NOT DETECTED Final   Serratia marcescens NOT DETECTED NOT DETECTED Final   Haemophilus influenzae NOT DETECTED NOT DETECTED Final   Neisseria meningitidis NOT DETECTED NOT DETECTED Final   Pseudomonas aeruginosa NOT DETECTED NOT DETECTED Final   Stenotrophomonas maltophilia NOT DETECTED NOT DETECTED Final   Candida albicans NOT DETECTED NOT DETECTED Final   Candida auris NOT DETECTED NOT DETECTED Final   Candida glabrata NOT DETECTED NOT DETECTED Final   Candida krusei NOT DETECTED NOT DETECTED Final   Candida parapsilosis NOT DETECTED NOT DETECTED Final   Candida tropicalis NOT DETECTED NOT DETECTED Final   Cryptococcus neoformans/gattii NOT DETECTED NOT DETECTED Final   Meth resistant mecA/C and MREJ NOT DETECTED NOT DETECTED Final    Comment: Performed at [facility], [address] [place], [place] [address]  Min Inhibitory Conc (2 Drugs)     Status: Abnormal   Collection Time: 04/08/22  7:12 PM  Result Value Ref Range Status   Min Inhibitory Conc (2 Drugs) Preliminary report (A)  Final    Comment: (NOTE) Performed At: [facility] [address] [place], [place] [id number] Jolene Schimke MD OF:[id number]    Source  (MIC2) [phone]  Final    Comment: Performed at [facility], [address] [place], [place] [address]  MIC Results (2 Drugs)     Status: Abnormal   Collection Time: 04/08/22  7:12 PM  Result Value Ref Range Status   RESULT 5 MIC (RESULT 1)                Base Name: Comment (A)  Final    Comment: (NOTE) Methicillin - resistant Staphylococcus aureus Identification performed by account, not confirmed by this laboratory. Performed At: [facility] [address] [place], [place] [id number] Jolene Schimke MD QP:[id number]   SARS Coronavirus 2 by RT PCR (hospital order, performed in [facility] hospital lab) *cepheid single result test* Anterior Nasal Swab     Status: None   Collection Time: 04/08/22  8:52 PM   Specimen: Anterior Nasal Swab  Result Value Ref Range Status   SARS Coronavirus 2 by RT PCR NEGATIVE NEGATIVE Final    Comment: (NOTE) SARS-CoV-2 target nucleic acids are NOT DETECTED.  The SARS-CoV-2 RNA is generally detectable in upper and lower respiratory specimens during the acute phase of infection. The lowest concentration of SARS-CoV-2 viral copies this assay can detect is 250 copies / mL. A negative result does not preclude SARS-CoV-2 infection and should not be used as the sole basis for treatment or other patient management decisions.  A negative result may occur with improper specimen collection / handling, submission of specimen other than nasopharyngeal swab, presence of viral mutation(s) within the areas targeted by this assay, and inadequate number of viral copies (<250 copies / mL). A negative result must be combined with clinical observations, patient history, and epidemiological information.  Fact  Sheet for Patients:   RoadLapTop.co.za  Fact Sheet for Healthcare Providers: http://kim-miller.com/  This test is not yet approved or  cleared by the [place] FDA and has been authorized for  detection and/or diagnosis of SARS-CoV-2 by FDA under an Emergency Use Authorization (EUA).  This EUA will remain in effect (meaning this test can be used) for the duration of the COVID-19 declaration under Section 564(b)(1) of the Act, 21 U.S.C. section 360bbb-3(b)(1), unless the authorization is terminated or revoked sooner.  Performed at [facility], [address] [place], [place] [address]   Culture, blood (Routine X 2) w Reflex to ID Panel     Status: None   Collection Time: 04/11/22 10:05 AM   Specimen: BLOOD RIGHT ARM  Result Value Ref Range Status   Specimen Description BLOOD RIGHT ARM  Final   Special Requests   Final    BOTTLES DRAWN AEROBIC AND ANAEROBIC Blood Culture results may not be optimal due to an inadequate volume of blood received in culture bottles   Culture   Final    NO GROWTH 5 DAYS Performed at [facility], [address], [place], [place] [address]    Report Status 04/16/2022 FINAL  Final  Culture, blood (Routine X 2) w Reflex to ID Panel     Status: None   Collection Time: 04/11/22 10:05 AM   Specimen: BLOOD LEFT ARM  Result Value Ref Range Status   Specimen Description BLOOD LEFT ARM  Final   Special Requests   Final    BOTTLES DRAWN AEROBIC AND ANAEROBIC Blood Culture results may not be optimal due to an inadequate volume of blood received in culture bottles   Culture   Final    NO GROWTH 5 DAYS Performed at [facility], [address] [place], [place] [address]    Report Status 04/16/2022 FINAL  Final    RADIOLOGY STUDIES/RESULTS: No results found.   LOS: 50 days   Signature  Huey Bienenstock M.D on 04/16/2022 at 4:49 PM   -  To page go to [email or web address]

## 2022-04-16 NOTE — Progress Notes (Signed)
The chaplain is following the Pt. chart with the Palliative Care team. This chaplain is present for F/U spiritual care at the bedside. The chaplain and the Pt. talk about passing each other in the hall.  The chaplain listens reflectively as the Pt. describes the onset of fever over the weekend. The chaplain understands from the Pt. the plan is for dental care the end of this week and heart surgery next week. The Pt. is cautiously optimistic and respectful of moving forward one day at a time. The Pt. shares he does dream about having a car one day.   The chaplain is unable to answer the Pt. question about the amount of time in recovery after heart surgery. The chaplain encourages the Pt. to ask the medical team because the Pt. is gathering information for himself and the family. The Pt. describes his grandmother as the person who wants to worry and needs the Pt. constant encouragement.  The Pt. is open to F/U spiritual care as needed.  Chaplain Stephanie Acre [phone]

## 2022-04-17 DIAGNOSIS — I079 Rheumatic tricuspid valve disease, unspecified: Secondary | ICD-10-CM | POA: Diagnosis not present

## 2022-04-17 LAB — BASIC METABOLIC PANEL
Anion gap: 8 (ref 5–15)
BUN: 10 mg/dL (ref 6–20)
CO2: 26 mmol/L (ref 22–32)
Calcium: 8.7 mg/dL — ABNORMAL LOW (ref 8.9–10.3)
Chloride: 103 mmol/L (ref 98–111)
Creatinine, Ser: 0.8 mg/dL (ref 0.61–1.24)
GFR, Estimated: >60 mL/min (ref >60)
Glucose, Bld: 95 mg/dL (ref 70–99)
Potassium: 4.2 mmol/L (ref 3.5–5.1)
Sodium: 137 mmol/L (ref 135–145)

## 2022-04-17 LAB — CBC
HCT: 26.4 % — ABNORMAL LOW (ref 39.0–52.0)
Hemoglobin: 8.5 g/dL — ABNORMAL LOW (ref 13.0–17.0)
MCH: 27 pg (ref 26.0–34.0)
MCHC: 32.2 g/dL (ref 30.0–36.0)
MCV: 83.8 fL (ref 80.0–100.0)
Platelets: 178 10*3/uL (ref 150–400)
RBC: 3.15 MIL/uL — ABNORMAL LOW (ref 4.22–5.81)
RDW: 16.8 % — ABNORMAL HIGH (ref 11.5–15.5)
WBC: 6 10*3/uL (ref 4.0–10.5)
nRBC: 0 % (ref 0.0–0.2)

## 2022-04-17 NOTE — Progress Notes (Signed)
PROGRESS NOTE        PATIENT DETAILS Name: Reginald Clarke Age: 31 y.o. Sex: male Date of Birth: 07/23/1991 Admit Date: 02/25/2022 Admitting Physician Dorcas Carrow, MD YIR:SWNIOEVO, [name], PA-C  Brief Summary: Patient is a 31 y.o.  male with history of recurrent MSSA native tricuspid valve endocarditis in spite of completing several courses of IV antibiotics.  Please see below for further details.  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  Underwent angio vac procedure on 4/26.Completed cefazolin on 5/10-planned weekly dalbavancin at the ID clinic.   5/22>> outpatient blood culture positive for MSSA 5/24-6/8>> admit to [facility] for persistent bacteremia-worsening back pain-discharge to [facility] on IV Ancef. 7/5>> discharged from [facility] on oral cefdinir.  Continued to have intermittent fever postdischarge.   7/24>> admit for-persistent fever/nausea/vomiting/diarrhea-blood cultures positive for MSSA. 9/5>> fever-repeat blood cultures growing Staph aureus supposed to finish IV antibiotic treatment 9/6 9/5 >> PICC line discontinued  Significant studies: 7/25>> CT chest: Multifocal airspace opacities-increasing cavitary nodules 7/25>> MRI thoracic spine: No evidence of infection in thoracic spine.  Significant microbiology data: 7/24>> urine culture: Negative 7/25>> blood culture: 1/2-MSSA. 7/27>> blood culture: No growth 7/29>> blood culture: No growth 9/04>> blood cultures:MRSA 9/07>> blood cultures:neg  Procedures: 8/3>> TEE: 3 vegetations on tricuspid valve-severe TR.  EF 60-65%  Consults: ID CT suregery Dentist   Subjective:  Lying comfortably in bed-denies any chest pain or shortness of breath.   Objective: Vitals: Blood pressure 110/70, pulse 96, temperature 98.5 F (36.9 C), temperature source Oral, resp. rate 18, height 5\' 10"  (1.778 m), weight 80.9 kg, SpO2 96 %.    Exam: Gen Exam:Alert awake-not in any distress HEENT:atraumatic, normocephalic Chest: B/L clear to auscultation anteriorly CVS:S1S2 regular Abdomen:soft non tender, non distended Extremities:no edema Neurology: Non focal Skin: no rash   Assessment/Plan: Sepsis due to recurrent MSSA bacteremia with native tricuspid valve endocarditis: Failed IV antibiotic treatment-dental surgery/cardiothoracic surgery eval in progress for possible dental extraction before proceeding with valve replacement surgery.  In the meantime-continue with IV antibiotics.  Normocytic anemia: Due to chronic disease/recurrent endocarditis: No indication for transfusion.  Anxiety: Stable-on Lexapro-Xanax be slowly weaned down.  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  Mild transaminitis: Downtrending-monitor periodically.  Nutrition Status: Nutrition Problem: Inadequate oral intake Etiology: vomiting, nausea Signs/Symptoms: per patient/family report Interventions: Ensure Enlive (each supplement provides 350kcal and 20 grams of protein), MVI, Snacks  BMI: Estimated body mass index is 25.59 kg/m as calculated from the following:   Height as of this encounter: 5\' 10"  (1.778 m).   Weight as of this encounter: 80.9 kg.   Code status:   Code Status: Full Code   DVT Prophylaxis:    Family Communication: None at bedside   Disposition Plan: Status is: Inpatient    Diet: Diet Order             Diet NPO time specified  Diet effective midnight           Diet regular Room service appropriate? Yes; Fluid consistency: Thin  Diet effective now                    MEDICATIONS: Scheduled Meds:  escitalopram  10 mg Oral Daily   feeding supplement  237 mL Oral BID BM  methadone  135 mg Oral Daily   multivitamin with minerals  1 tablet Oral Daily   nicotine  14 mg Transdermal Daily   traZODone  50 mg Oral QHS   Continuous  Infusions:  ceFTAROline (TEFLARO) IV 600 mg (04/17/22 0446)   DAPTOmycin (CUBICIN) 650 mg in sodium chloride 0.9 % IVPB 650 mg (04/16/22 2007)   sodium chloride     PRN Meds:.acetaminophen, ALPRAZolam, alum & mag hydroxide-simeth, bisacodyl, diclofenac Sodium, docusate sodium, hydrOXYzine, ibuprofen, loperamide, methocarbamol, polyethylene glycol, promethazine, sodium chloride, sodium chloride flush   I have personally reviewed following labs and imaging studies  LABORATORY DATA: Recent Labs  Lab 04/12/22 0304 04/13/22 1400 04/14/22 0224 04/15/22 0705 04/16/22 0712 04/17/22 0304  WBC 7.2 6.1 7.2 5.0 7.1 6.0  HGB 8.3* 8.5* 9.0* 9.5* 8.3* 8.5*  HCT 25.6* 26.7* 28.4* 29.7* 25.9* 26.4*  PLT 100* 113* 153 151 179 178  MCV 83.9 84.8 84.0 84.4 83.8 83.8  MCH 27.2 27.0 26.6 27.0 26.9 27.0  MCHC 32.4 31.8 31.7 32.0 32.0 32.2  RDW 16.9* 17.0* 16.7* 16.6* 16.6* 16.8*  LYMPHSABS 1.5 1.4 2.0 1.7 1.8  --   MONOABS 0.9 0.6 0.8 0.5 0.6  --   EOSABS 0.1 0.1 0.1 0.2 0.2  --   BASOSABS 0.0 0.0 0.0 0.0 0.0  --      Recent Labs  Lab 04/11/22 0939 04/12/22 0304 04/13/22 1400 04/14/22 0224 04/15/22 0705 04/16/22 0712 04/17/22 0304  NA 135 134* 133* 136  --   --  137  K 3.7 3.6 5.2* 3.7  --   --  4.2  CL 101 99 99 102  --   --  103  CO2 25 25 22 24   --   --  26  GLUCOSE 137* 99 91 98  --   --  95  BUN 10 15 13 13   --   --  10  CREATININE 0.87 0.79 0.82 0.93  --   --  0.80  CALCIUM 9.0 8.6* 8.7* 9.0  --   --  8.7*  AST 28 27 42* 31  --   --   --   ALT 68* 58* 57* 59*  --   --   --   ALKPHOS 70 66 83 73  --   --   --   BILITOT 0.8 0.5 0.6 0.2*  --   --   --   ALBUMIN 3.0* 2.7* 3.0* 2.9*  --   --   --   MG 2.0 1.9 2.0 2.0 2.3 2.1  --      MICROBIOLOGY: Recent Results (from the past 240 hour(s))  Culture, blood (Routine X 2) w Reflex to ID Panel     Status: Abnormal   Collection Time: 04/08/22  7:12 PM   Specimen: BLOOD  Result Value Ref Range Status   Specimen Description BLOOD  BLOOD LEFT FOREARM  Final   Special Requests   Final    BOTTLES DRAWN AEROBIC AND ANAEROBIC Blood Culture results may not be optimal due to an inadequate volume of blood received in culture bottles ARPEDB USED AS AEROBIC BOTTLE   Culture  Setup Time   Final    GRAM POSITIVE COCCI IN CLUSTERS IN BOTH AEROBIC AND ANAEROBIC BOTTLES CRITICAL RESULT CALLED TO, READ BACK BY AND VERIFIED WITH: PHARMD 06/08/22 @1728  FH    Culture (A)  Final    STAPHYLOCOCCUS AUREUS SUSCEPTIBILITIES PERFORMED ON PREVIOUS CULTURE WITHIN THE LAST 5 DAYS. Performed at [facility], [address], [place], [place] [address]    Report Status 04/12/2022 FINAL  Final  Culture, blood (Routine X 2) w Reflex to ID Panel     Status: Abnormal (Preliminary result)   Collection Time: 04/08/22  7:12 PM   Specimen: BLOOD  Result Value Ref Range Status   Specimen Description BLOOD BLOOD LEFT FOREARM  Final   Special Requests   Final    BOTTLES DRAWN AEROBIC AND ANAEROBIC Blood Culture results may not be optimal due to an inadequate volume of blood received in culture bottles ARPEDB USED AS AEROBIC   Culture  Setup Time   Final    GRAM POSITIVE COCCI IN CLUSTERS IN BOTH AEROBIC AND ANAEROBIC BOTTLES CRITICAL RESULT CALLED TO, READ BACK BY AND VERIFIED WITH: PHARMD MADELEINE MITCHELL ON 04/09/22 @ 1625 BY DRT    Culture (A)  Final    METHICILLIN RESISTANT STAPHYLOCOCCUS AUREUS RETEST CONFIRMED MODIFICATION OF PBP (MECA) Sent to [facility] for further susceptibility testing. Performed at [facility], [address], [place], [place] [address]    Report Status PENDING  Incomplete   Organism ID, Bacteria METHICILLIN RESISTANT STAPHYLOCOCCUS AUREUS  Final      Susceptibility   Methicillin resistant staphylococcus aureus - MIC*    CIPROFLOXACIN <=0.5 SENSITIVE Sensitive     ERYTHROMYCIN >=8 RESISTANT Resistant     GENTAMICIN <=0.5 SENSITIVE Sensitive     OXACILLIN RESISTANT Resistant     TETRACYCLINE <=1  SENSITIVE Sensitive     VANCOMYCIN 2 SENSITIVE Sensitive     TRIMETH/SULFA <=10 SENSITIVE Sensitive     CLINDAMYCIN <=0.25 SENSITIVE Sensitive     RIFAMPIN <=0.5 SENSITIVE Sensitive     Inducible Clindamycin NEGATIVE Sensitive     * METHICILLIN RESISTANT STAPHYLOCOCCUS AUREUS  Blood Culture ID Panel (Reflexed)     Status: Abnormal   Collection Time: 04/08/22  7:12 PM  Result Value Ref Range Status   Enterococcus faecalis NOT DETECTED NOT DETECTED Final   Enterococcus Faecium NOT DETECTED NOT DETECTED Final   Listeria monocytogenes NOT DETECTED NOT DETECTED Final   Staphylococcus species DETECTED (A) NOT DETECTED Final    Comment: CRITICAL RESULT CALLED TO, READ BACK BY AND VERIFIED WITH: PHARMD MADELEINE MITCHELL ON 04/09/22 @ 1625 BY DRT    Staphylococcus aureus (BCID) DETECTED (A) NOT DETECTED Final    Comment: CRITICAL RESULT CALLED TO, READ BACK BY AND VERIFIED WITH: PHARMD MADELEINE MITCHELL ON 04/09/22 @ 1625 BY DRT    Staphylococcus epidermidis NOT DETECTED NOT DETECTED Final   Staphylococcus lugdunensis NOT DETECTED NOT DETECTED Final   Streptococcus species NOT DETECTED NOT DETECTED Final   Streptococcus agalactiae NOT DETECTED NOT DETECTED Final   Streptococcus pneumoniae NOT DETECTED NOT DETECTED Final   Streptococcus pyogenes NOT DETECTED NOT DETECTED Final   A.calcoaceticus-baumannii NOT DETECTED NOT DETECTED Final   Bacteroides fragilis NOT DETECTED NOT DETECTED Final   Enterobacterales NOT DETECTED NOT DETECTED Final   Enterobacter cloacae complex NOT DETECTED NOT DETECTED Final   Escherichia coli NOT DETECTED NOT DETECTED Final   Klebsiella aerogenes NOT DETECTED NOT DETECTED Final   Klebsiella oxytoca NOT DETECTED NOT DETECTED Final   Klebsiella pneumoniae NOT DETECTED NOT DETECTED Final   Proteus species NOT DETECTED NOT DETECTED Final   Salmonella species NOT DETECTED NOT DETECTED Final   Serratia marcescens NOT DETECTED NOT DETECTED Final   Haemophilus influenzae  NOT DETECTED NOT DETECTED Final   Neisseria meningitidis NOT DETECTED NOT DETECTED Final   Pseudomonas aeruginosa  NOT DETECTED NOT DETECTED Final   Stenotrophomonas maltophilia NOT DETECTED NOT DETECTED Final   Candida albicans NOT DETECTED NOT DETECTED Final   Candida auris NOT DETECTED NOT DETECTED Final   Candida glabrata NOT DETECTED NOT DETECTED Final   Candida krusei NOT DETECTED NOT DETECTED Final   Candida parapsilosis NOT DETECTED NOT DETECTED Final   Candida tropicalis NOT DETECTED NOT DETECTED Final   Cryptococcus neoformans/gattii NOT DETECTED NOT DETECTED Final   Meth resistant mecA/C and MREJ NOT DETECTED NOT DETECTED Final    Comment: Performed at [facility], [address], [place], [place] [address]  Min Inhibitory Conc (2 Drugs)     Status: Abnormal   Collection Time: 04/08/22  7:12 PM  Result Value Ref Range Status   Min Inhibitory Conc (2 Drugs) Preliminary report (A)  Final    Comment: (NOTE) Performed At: [facility] [address] [place], [place] [id number] Jolene Schimke MD PF:[id number]    Source (MIC2) [id number]  Final    Comment: Performed at [facility], [address], [place], [place] [address]  MIC Results (2 Drugs)     Status: Abnormal   Collection Time: 04/08/22  7:12 PM  Result Value Ref Range Status   RESULT 5 MIC (RESULT 1)                Base Name: Comment (A)  Final    Comment: (NOTE) Methicillin - resistant Staphylococcus aureus Identification performed by account, not confirmed by this laboratory. CEFTAROLINE    <=1 UG/ML = SUSCEPTIBLE Performed At: [facility] [address] [place], [place] [id number] Jolene Schimke MD LG:[id number]   SARS Coronavirus 2 by RT PCR (hospital order, performed in [facility] hospital lab) *cepheid single result test* Anterior Nasal Swab     Status: None   Collection Time: 04/08/22  8:52 PM   Specimen: Anterior Nasal Swab  Result Value Ref Range Status   SARS Coronavirus 2 by  RT PCR NEGATIVE NEGATIVE Final    Comment: (NOTE) SARS-CoV-2 target nucleic acids are NOT DETECTED.  The SARS-CoV-2 RNA is generally detectable in upper and lower respiratory specimens during the acute phase of infection. The lowest concentration of SARS-CoV-2 viral copies this assay can detect is 250 copies / mL. A negative result does not preclude SARS-CoV-2 infection and should not be used as the sole basis for treatment or other patient management decisions.  A negative result may occur with improper specimen collection / handling, submission of specimen other than nasopharyngeal swab, presence of viral mutation(s) within the areas targeted by this assay, and inadequate number of viral copies (<250 copies / mL). A negative result must be combined with clinical observations, patient history, and epidemiological information.  Fact Sheet for Patients:   RoadLapTop.co.za  Fact Sheet for Healthcare Providers: http://kim-miller.com/  This test is not yet approved or  cleared by the [place] FDA and has been authorized for detection and/or diagnosis of SARS-CoV-2 by FDA under an Emergency Use Authorization (EUA).  This EUA will remain in effect (meaning this test can be used) for the duration of the COVID-19 declaration under Section 564(b)(1) of the Act, 21 U.S.C. section 360bbb-3(b)(1), unless the authorization is terminated or revoked sooner.  Performed at [facility], [address], [place], [place] [address]   Culture, blood (Routine X 2) w Reflex to ID Panel     Status: None   Collection Time: 04/11/22 10:05 AM   Specimen: BLOOD RIGHT  ARM  Result Value Ref Range Status   Specimen Description BLOOD RIGHT ARM  Final   Special Requests   Final    BOTTLES DRAWN AEROBIC AND ANAEROBIC Blood Culture results may not be optimal due to an inadequate volume of blood received in culture bottles   Culture   Final    NO GROWTH 5  DAYS Performed at [facility], [address] [place], [place] [id number]    Report Status 04/16/2022 FINAL  Final  Culture, blood (Routine X 2) w Reflex to ID Panel     Status: None   Collection Time: 04/11/22 10:05 AM   Specimen: BLOOD LEFT ARM  Result Value Ref Range Status   Specimen Description BLOOD LEFT ARM  Final   Special Requests   Final    BOTTLES DRAWN AEROBIC AND ANAEROBIC Blood Culture results may not be optimal due to an inadequate volume of blood received in culture bottles   Culture   Final    NO GROWTH 5 DAYS Performed at [facility], [address] [place], [place] [address]    Report Status 04/16/2022 FINAL  Final    RADIOLOGY STUDIES/RESULTS: No results found.   LOS: 51 days   Signature  Jeoffrey MassedShanker Lawerence Dery M.D on 04/17/2022 at 1:54 PM   -  To page go to [email or web address]

## 2022-04-18 ENCOUNTER — Inpatient Hospital Stay (HOSPITAL_COMMUNITY): Payer: BLUE CROSS/BLUE SHIELD | Admitting: Certified Registered"

## 2022-04-18 ENCOUNTER — Encounter (HOSPITAL_COMMUNITY): Payer: Self-pay | Admitting: Internal Medicine

## 2022-04-18 ENCOUNTER — Other Ambulatory Visit: Payer: Self-pay

## 2022-04-18 ENCOUNTER — Encounter (HOSPITAL_COMMUNITY)
Admission: EM | Disposition: A | Discharge status: Home / Self Care | Payer: Self-pay | Source: Home / Self Care | Attending: Internal Medicine

## 2022-04-18 DIAGNOSIS — I079 Rheumatic tricuspid valve disease, unspecified: Secondary | ICD-10-CM | POA: Diagnosis not present

## 2022-04-18 HISTORY — PX: MULTIPLE EXTRACTIONS WITH ALVEOLOPLASTY: SHX5342

## 2022-04-18 LAB — CK: Total CK: 12 U/L — ABNORMAL LOW (ref 49–397)

## 2022-04-18 SURGERY — MULTIPLE EXTRACTION WITH ALVEOLOPLASTY
Anesthesia: General

## 2022-04-18 MED ORDER — MIDAZOLAM HCL 2 MG/2ML IJ SOLN
INTRAMUSCULAR | Status: AC
  Filled 2022-04-18: qty 2

## 2022-04-18 MED ORDER — CHLORHEXIDINE GLUCONATE 0.12 % MT SOLN: 15.0000 mL | Freq: Once | OROMUCOSAL | Status: AC

## 2022-04-18 MED ORDER — LIDOCAINE-EPINEPHRINE 2 %-1:100000 IJ SOLN
INTRAMUSCULAR | Status: AC
  Filled 2022-04-18: qty 6.8

## 2022-04-18 MED ORDER — DEXAMETHASONE SODIUM PHOSPHATE 10 MG/ML IJ SOLN
INTRAMUSCULAR | Status: DC | PRN
  Administered 2022-04-18: 10 mg via INTRAVENOUS

## 2022-04-18 MED ORDER — MIDAZOLAM HCL 2 MG/2ML IJ SOLN
INTRAMUSCULAR | Status: DC | PRN
  Administered 2022-04-18: 2 mg via INTRAVENOUS

## 2022-04-18 MED ORDER — ONDANSETRON HCL 4 MG/2ML IJ SOLN
INTRAMUSCULAR | Status: AC
  Filled 2022-04-18: qty 2

## 2022-04-18 MED ORDER — ROCURONIUM BROMIDE 100 MG/10ML IV SOLN
INTRAVENOUS | Status: DC | PRN
  Administered 2022-04-18: 10 mg via INTRAVENOUS
  Administered 2022-04-18: 50 mg via INTRAVENOUS

## 2022-04-18 MED ORDER — HYDROMORPHONE HCL 1 MG/ML IJ SOLN
INTRAMUSCULAR | Status: AC
  Filled 2022-04-18: qty 1

## 2022-04-18 MED ORDER — LIDOCAINE-EPINEPHRINE 2 %-1:100000 IJ SOLN
INTRAMUSCULAR | Status: DC | PRN
  Administered 2022-04-18: 5.1 mL via INTRADERMAL

## 2022-04-18 MED ORDER — 0.9 % SODIUM CHLORIDE (POUR BTL) OPTIME
TOPICAL | Status: DC | PRN
  Administered 2022-04-18: 1000 mL

## 2022-04-18 MED ORDER — HEMOSTATIC AGENTS (NO CHARGE) OPTIME
TOPICAL | Status: DC | PRN
  Administered 2022-04-18: 1 via TOPICAL

## 2022-04-18 MED ORDER — CHLORHEXIDINE GLUCONATE 0.12 % MT SOLN
OROMUCOSAL | Status: AC
  Administered 2022-04-18: 15 mL via OROMUCOSAL
  Filled 2022-04-18: qty 15

## 2022-04-18 MED ORDER — OXYCODONE HCL 5 MG PO TABS
10.0000 mg | ORAL_TABLET | Freq: Four times a day (QID) | ORAL | Status: DC | PRN
  Administered 2022-04-18 – 2022-04-23 (×12): 10 mg via ORAL
  Filled 2022-04-18 (×12): qty 2

## 2022-04-18 MED ORDER — PROPOFOL 10 MG/ML IV BOLUS
INTRAVENOUS | Status: DC | PRN
  Administered 2022-04-18: 150 mg via INTRAVENOUS

## 2022-04-18 MED ORDER — LACTATED RINGERS IV SOLN: INTRAVENOUS | Status: DC

## 2022-04-18 MED ORDER — ORAL CARE MOUTH RINSE: 15.0000 mL | Freq: Once | OROMUCOSAL | Status: AC

## 2022-04-18 MED ORDER — ONDANSETRON HCL 4 MG/2ML IJ SOLN
INTRAMUSCULAR | Status: DC | PRN
  Administered 2022-04-18: 4 mg via INTRAVENOUS

## 2022-04-18 MED ORDER — OXYMETAZOLINE HCL 0.05 % NA SOLN
NASAL | Status: DC | PRN
  Administered 2022-04-18 (×2): 2 via NASAL

## 2022-04-18 MED ORDER — LIDOCAINE 2% (20 MG/ML) 5 ML SYRINGE
INTRAMUSCULAR | Status: DC | PRN
  Administered 2022-04-18: 50 mg via INTRAVENOUS

## 2022-04-18 MED ORDER — BUPIVACAINE-EPINEPHRINE 0.5% -1:200000 IJ SOLN
INTRAMUSCULAR | Status: DC | PRN
  Administered 2022-04-18: .9 mL

## 2022-04-18 MED ORDER — SUGAMMADEX SODIUM 200 MG/2ML IV SOLN
INTRAVENOUS | Status: DC | PRN
  Administered 2022-04-18: 200 mg via INTRAVENOUS

## 2022-04-18 MED ORDER — HYDROMORPHONE HCL 1 MG/ML IJ SOLN
0.2500 mg | INTRAMUSCULAR | Status: DC | PRN
  Administered 2022-04-18 (×2): 0.5 mg via INTRAVENOUS

## 2022-04-18 MED ORDER — OXYMETAZOLINE HCL 0.05 % NA SOLN
NASAL | Status: AC
  Filled 2022-04-18: qty 30

## 2022-04-18 MED ORDER — CEFAZOLIN SODIUM-DEXTROSE 2-4 GM/100ML-% IV SOLN
INTRAVENOUS | Status: AC
  Filled 2022-04-18: qty 100

## 2022-04-18 MED ORDER — CEFAZOLIN SODIUM-DEXTROSE 2-4 GM/100ML-% IV SOLN
2.0000 g | INTRAVENOUS | Status: AC
  Administered 2022-04-18: 2 g via INTRAVENOUS

## 2022-04-18 MED ORDER — FENTANYL CITRATE (PF) 250 MCG/5ML IJ SOLN
INTRAMUSCULAR | Status: AC
  Filled 2022-04-18: qty 5

## 2022-04-18 MED ORDER — FENTANYL CITRATE (PF) 250 MCG/5ML IJ SOLN
INTRAMUSCULAR | Status: DC | PRN
  Administered 2022-04-18: 100 ug via INTRAVENOUS
  Administered 2022-04-18 (×3): 50 ug via INTRAVENOUS

## 2022-04-18 MED ORDER — BUPIVACAINE-EPINEPHRINE (PF) 0.5% -1:200000 IJ SOLN
INTRAMUSCULAR | Status: AC
  Filled 2022-04-18: qty 3.6

## 2022-04-18 SURGICAL SUPPLY — 35 items
ALCOHOL 70% 16 OZ (MISCELLANEOUS) ×1 IMPLANT
BAG COUNTER SPONGE SURGICOUNT (BAG) ×1 IMPLANT
BAG SPNG CNTER NS LX DISP (BAG) ×1
BLADE SURG 15 STRL LF DISP TIS (BLADE) ×1 IMPLANT
BLADE SURG 15 STRL SS (BLADE) ×1
COVER SURGICAL LIGHT HANDLE (MISCELLANEOUS) ×1 IMPLANT
GAUZE 4X4 16PLY ~~LOC~~+RFID DBL (SPONGE) ×1 IMPLANT
GAUZE PACKING FOLDED 2  STR (GAUZE/BANDAGES/DRESSINGS) ×1
GAUZE PACKING FOLDED 2 STR (GAUZE/BANDAGES/DRESSINGS) ×1 IMPLANT
GLOVE SURG ENC MOIS LTX SZ6.5 (GLOVE) ×1 IMPLANT
GLOVE SURG POLYISO LF SZ6 (GLOVE) ×1 IMPLANT
GOWN STRL REUS W/ TWL LRG LVL3 (GOWN DISPOSABLE) ×2 IMPLANT
GOWN STRL REUS W/TWL LRG LVL3 (GOWN DISPOSABLE) ×2
KIT BASIN OR (CUSTOM PROCEDURE TRAY) ×1 IMPLANT
KIT TURNOVER KIT B (KITS) ×1 IMPLANT
MANIFOLD NEPTUNE II (INSTRUMENTS) ×1 IMPLANT
NDL BLUNT 16X1.5 OR ONLY (NEEDLE) ×1 IMPLANT
NEEDLE BLUNT 16X1.5 OR ONLY (NEEDLE) ×1 IMPLANT
NS IRRIG 1000ML POUR BTL (IV SOLUTION) ×1 IMPLANT
PACK EENT II TURBAN DRAPE (CUSTOM PROCEDURE TRAY) ×1 IMPLANT
PAD ARMBOARD 7.5X6 YLW CONV (MISCELLANEOUS) ×1 IMPLANT
SPONGE SURGIFOAM ABS GEL 100 (HEMOSTASIS) IMPLANT
SPONGE SURGIFOAM ABS GEL 12-7 (HEMOSTASIS) IMPLANT
SPONGE SURGIFOAM ABS GEL SZ50 (HEMOSTASIS) IMPLANT
SUCTION FRAZIER HANDLE 10FR (MISCELLANEOUS) ×1
SUCTION TUBE FRAZIER 10FR DISP (MISCELLANEOUS) ×1 IMPLANT
SUT CHROMIC 3 0 PS 2 (SUTURE) ×2 IMPLANT
SUT CHROMIC 4 0 P 3 18 (SUTURE) IMPLANT
SYR 50ML SLIP (SYRINGE) ×1 IMPLANT
SYR BULB IRRIG 60ML STRL (SYRINGE) ×1 IMPLANT
TOWEL GREEN STERILE FF (TOWEL DISPOSABLE) ×1 IMPLANT
TUBE CONNECTING 12X1/4 (SUCTIONS) ×1 IMPLANT
WATER STERILE IRR 1000ML POUR (IV SOLUTION) ×1 IMPLANT
WATER TABLETS ICX (MISCELLANEOUS) ×1 IMPLANT
YANKAUER SUCT BULB TIP NO VENT (SUCTIONS) ×1 IMPLANT

## 2022-04-18 NOTE — Interval H&P Note (Signed)
History and Physical Interval Note:  04/18/2022   Reginald Clarke  has presented today for surgery, with the diagnosis of dental caries.  The various methods of treatment have been discussed with the patient and family. After consideration of risks, benefits and other options for treatment, the patient has consented to the procedure(s): MULTIPLE EXTRACTIONS WITH ALVEOLOPLASTY as a surgical intervention.  The patient's history has been reviewed, patient examined, no change in status, stable for surgery.  I have reviewed the patient's chart and labs.  Questions were answered to the patient's satisfaction.     -Alda Berthold, DMD

## 2022-04-18 NOTE — H&P (View-Only) (Signed)
                       PROGRESS NOTE        PATIENT DETAILS Name: Reginald Clarke Age: 31 y.o. Sex: male Date of Birth: 07/24/1991 Admit Date: 02/25/2022 Admitting Physician Kuber Crystal Ellwood, MD PCP:Allwardt, Alyssa M, PA-C  Brief Summary: Patient is a 31 y.o.  male with history of recurrent MSSA native tricuspid valve endocarditis in spite of completing several courses of IV antibiotics.  Please see below for further details.  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  Underwent angio vac procedure on 4/26.Completed cefazolin on 5/10-planned weekly dalbavancin at the ID clinic.   5/22>> outpatient blood culture positive for MSSA 5/24-6/8>> admit to [facility] for persistent bacteremia-worsening back pain-discharge to [facility] on IV Ancef. 7/5>> discharged from [facility] on oral cefdinir.  Continued to have intermittent fever postdischarge.   7/24>> admit for-persistent fever/nausea/vomiting/diarrhea-blood cultures positive for MSSA. 9/5>> fever-repeat blood cultures growing Staph aureus supposed to finish IV antibiotic treatment 9/6 9/5 >> PICC line discontinued  Significant studies: 7/25>> CT chest: Multifocal airspace opacities-increasing cavitary nodules 7/25>> MRI thoracic spine: No evidence of infection in thoracic spine.  Significant microbiology data: 7/24>> urine culture: Negative 7/25>> blood culture: 1/2-MSSA. 7/27>> blood culture: No growth 7/29>> blood culture: No growth 9/04>> blood cultures:MRSA 9/07>> blood cultures:neg  Procedures: 8/3>> TEE: 3 vegetations on tricuspid valve-severe TR.  EF 60-65%  Consults: ID CT suregery Dentist   Subjective:  Lying comfortably in bed-no chest pain or shortness of breath.  Objective: Vitals: Blood pressure 110/70, pulse 96, temperature 98.5 F (36.9 C), temperature source Oral, resp. rate 18, height 5' 10" (1.778 m), weight 80.9 kg, SpO2 96 %.   Exam: Gen  Exam:Alert awake-not in any distress HEENT:atraumatic, normocephalic Chest: B/L clear to auscultation anteriorly CVS:S1S2 regular Abdomen:soft non tender, non distended Extremities:no edema Neurology: Non focal Skin: no rash   Assessment/Plan: Sepsis due to recurrent MSSA bacteremia with native tricuspid valve endocarditis: Failed IV antibiotic treatment-dental surgery planning teeth extraction today-following which cardiothoracic surgery will eval for possible valve replacement next week.  In the meantime-continue with IV antibiotics.    Normocytic anemia: Due to chronic disease/recurrent endocarditis: No indication for transfusion.  Anxiety: Stable-on Lexapro-Xanax be slowly weaned down.  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  Mild transaminitis: Downtrending-monitor periodically.  Nutrition Status: Nutrition Problem: Inadequate oral intake Etiology: vomiting, nausea Signs/Symptoms: per patient/family report Interventions: Ensure Enlive (each supplement provides 350kcal and 20 grams of protein), MVI, Snacks  BMI: Estimated body mass index is 25.59 kg/m as calculated from the following:   Height as of this encounter: 5' 10" (1.778 m).   Weight as of this encounter: 80.9 kg.   Code status:   Code Status: Full Code   DVT Prophylaxis:    Family Communication: None at bedside   Disposition Plan: Status is: Inpatient    Diet: Diet Order             Diet NPO time specified  Diet effective midnight                    MEDICATIONS: Scheduled Meds:  escitalopram  10 mg Oral Daily   feeding supplement  237 mL Oral BID BM   methadone  135 mg Oral Daily   multivitamin with minerals  1 tablet Oral Daily   nicotine  14 mg   Transdermal Daily   traZODone  50 mg Oral QHS   Continuous Infusions:  ceFTAROline (TEFLARO) IV 600 mg (04/18/22 0504)   DAPTOmycin (CUBICIN) 650 mg in sodium chloride 0.9 %  IVPB Stopped (04/17/22 2300)   sodium chloride     PRN Meds:.acetaminophen, ALPRAZolam, alum & mag hydroxide-simeth, bisacodyl, diclofenac Sodium, docusate sodium, hydrOXYzine, ibuprofen, loperamide, methocarbamol, polyethylene glycol, promethazine, sodium chloride, sodium chloride flush   I have personally reviewed following labs and imaging studies  LABORATORY DATA: Recent Labs  Lab 04/12/22 0304 04/13/22 1400 04/14/22 0224 04/15/22 0705 04/16/22 0712 04/17/22 0304  WBC 7.2 6.1 7.2 5.0 7.1 6.0  HGB 8.3* 8.5* 9.0* 9.5* 8.3* 8.5*  HCT 25.6* 26.7* 28.4* 29.7* 25.9* 26.4*  PLT 100* 113* 153 151 179 178  MCV 83.9 84.8 84.0 84.4 83.8 83.8  MCH 27.2 27.0 26.6 27.0 26.9 27.0  MCHC 32.4 31.8 31.7 32.0 32.0 32.2  RDW 16.9* 17.0* 16.7* 16.6* 16.6* 16.8*  LYMPHSABS 1.5 1.4 2.0 1.7 1.8  --   MONOABS 0.9 0.6 0.8 0.5 0.6  --   EOSABS 0.1 0.1 0.1 0.2 0.2  --   BASOSABS 0.0 0.0 0.0 0.0 0.0  --      Recent Labs  Lab 04/12/22 0304 04/13/22 1400 04/14/22 0224 04/15/22 0705 04/16/22 0712 04/17/22 0304  NA 134* 133* 136  --   --  137  K 3.6 5.2* 3.7  --   --  4.2  CL 99 99 102  --   --  103  CO2 25 22 24  --   --  26  GLUCOSE 99 91 98  --   --  95  BUN 15 13 13  --   --  10  CREATININE 0.79 0.82 0.93  --   --  0.80  CALCIUM 8.6* 8.7* 9.0  --   --  8.7*  AST 27 42* 31  --   --   --   ALT 58* 57* 59*  --   --   --   ALKPHOS 66 83 73  --   --   --   BILITOT 0.5 0.6 0.2*  --   --   --   ALBUMIN 2.7* 3.0* 2.9*  --   --   --   MG 1.9 2.0 2.0 2.3 2.1  --      MICROBIOLOGY: Recent Results (from the past 240 hour(s))  Culture, blood (Routine X 2) w Reflex to ID Panel     Status: Abnormal   Collection Time: 04/08/22  7:12 PM   Specimen: BLOOD  Result Value Ref Range Status   Specimen Description BLOOD BLOOD LEFT FOREARM  Final   Special Requests   Final    BOTTLES DRAWN AEROBIC AND ANAEROBIC Blood Culture results may not be optimal due to an inadequate volume of blood received in  culture bottles ARPEDB USED AS AEROBIC BOTTLE   Culture  Setup Time   Final    GRAM POSITIVE COCCI IN CLUSTERS IN BOTH AEROBIC AND ANAEROBIC BOTTLES CRITICAL RESULT CALLED TO, READ BACK BY AND VERIFIED WITH: PHARMD M. MITCHELL [id number] @1728 FH    Culture (A)  Final    STAPHYLOCOCCUS AUREUS SUSCEPTIBILITIES PERFORMED ON PREVIOUS CULTURE WITHIN THE LAST 5 DAYS. Performed at [facility], [address] [place], [place] [address]    Report Status 04/12/2022 FINAL  Final  Culture, blood (Routine X 2) w Reflex to ID Panel     Status: Abnormal (Preliminary result)     Collection Time: 04/08/22  7:12 PM   Specimen: BLOOD  Result Value Ref Range Status   Specimen Description BLOOD BLOOD LEFT FOREARM  Final   Special Requests   Final    BOTTLES DRAWN AEROBIC AND ANAEROBIC Blood Culture results may not be optimal due to an inadequate volume of blood received in culture bottles ARPEDB USED AS AEROBIC   Culture  Setup Time   Final    GRAM POSITIVE COCCI IN CLUSTERS IN BOTH AEROBIC AND ANAEROBIC BOTTLES CRITICAL RESULT CALLED TO, READ BACK BY AND VERIFIED WITH: PHARMD MADELEINE MITCHELL ON 04/09/22 @ 1625 BY DRT    Culture (A)  Final    METHICILLIN RESISTANT STAPHYLOCOCCUS AUREUS RETEST CONFIRMED MODIFICATION OF PBP (MECA) Sent to [facility] for further susceptibility testing. Performed at [facility], [address] [place], [place] [address]    Report Status PENDING  Incomplete   Organism ID, Bacteria METHICILLIN RESISTANT STAPHYLOCOCCUS AUREUS  Final      Susceptibility   Methicillin resistant staphylococcus aureus - MIC*    CIPROFLOXACIN <=0.5 SENSITIVE Sensitive     ERYTHROMYCIN >=8 RESISTANT Resistant     GENTAMICIN <=0.5 SENSITIVE Sensitive     OXACILLIN RESISTANT Resistant     TETRACYCLINE <=1 SENSITIVE Sensitive     VANCOMYCIN 2 SENSITIVE Sensitive     TRIMETH/SULFA <=10 SENSITIVE Sensitive     CLINDAMYCIN <=0.25 SENSITIVE Sensitive     RIFAMPIN <=0.5 SENSITIVE Sensitive      Inducible Clindamycin NEGATIVE Sensitive     * METHICILLIN RESISTANT STAPHYLOCOCCUS AUREUS  Blood Culture ID Panel (Reflexed)     Status: Abnormal   Collection Time: 04/08/22  7:12 PM  Result Value Ref Range Status   Enterococcus faecalis NOT DETECTED NOT DETECTED Final   Enterococcus Faecium NOT DETECTED NOT DETECTED Final   Listeria monocytogenes NOT DETECTED NOT DETECTED Final   Staphylococcus species DETECTED (A) NOT DETECTED Final    Comment: CRITICAL RESULT CALLED TO, READ BACK BY AND VERIFIED WITH: PHARMD MADELEINE MITCHELL ON 04/09/22 @ 1625 BY DRT    Staphylococcus aureus (BCID) DETECTED (A) NOT DETECTED Final    Comment: CRITICAL RESULT CALLED TO, READ BACK BY AND VERIFIED WITH: PHARMD MADELEINE MITCHELL ON 04/09/22 @ 1625 BY DRT    Staphylococcus epidermidis NOT DETECTED NOT DETECTED Final   Staphylococcus lugdunensis NOT DETECTED NOT DETECTED Final   Streptococcus species NOT DETECTED NOT DETECTED Final   Streptococcus agalactiae NOT DETECTED NOT DETECTED Final   Streptococcus pneumoniae NOT DETECTED NOT DETECTED Final   Streptococcus pyogenes NOT DETECTED NOT DETECTED Final   A.calcoaceticus-baumannii NOT DETECTED NOT DETECTED Final   Bacteroides fragilis NOT DETECTED NOT DETECTED Final   Enterobacterales NOT DETECTED NOT DETECTED Final   Enterobacter cloacae complex NOT DETECTED NOT DETECTED Final   Escherichia coli NOT DETECTED NOT DETECTED Final   Klebsiella aerogenes NOT DETECTED NOT DETECTED Final   Klebsiella oxytoca NOT DETECTED NOT DETECTED Final   Klebsiella pneumoniae NOT DETECTED NOT DETECTED Final   Proteus species NOT DETECTED NOT DETECTED Final   Salmonella species NOT DETECTED NOT DETECTED Final   Serratia marcescens NOT DETECTED NOT DETECTED Final   Haemophilus influenzae NOT DETECTED NOT DETECTED Final   Neisseria meningitidis NOT DETECTED NOT DETECTED Final   Pseudomonas aeruginosa NOT DETECTED NOT DETECTED Final   Stenotrophomonas maltophilia NOT  DETECTED NOT DETECTED Final   Candida albicans NOT DETECTED NOT DETECTED Final   Candida auris NOT DETECTED NOT DETECTED Final   Candida glabrata NOT DETECTED NOT  DETECTED Final   Candida krusei NOT DETECTED NOT DETECTED Final   Candida parapsilosis NOT DETECTED NOT DETECTED Final   Candida tropicalis NOT DETECTED NOT DETECTED Final   Cryptococcus neoformans/gattii NOT DETECTED NOT DETECTED Final   Meth resistant mecA/C and MREJ NOT DETECTED NOT DETECTED Final    Comment: Performed at [facility], [address] [place], [place] [address]  Min Inhibitory Conc (2 Drugs)     Status: Abnormal   Collection Time: 04/08/22  7:12 PM  Result Value Ref Range Status   Min Inhibitory Conc (2 Drugs) Preliminary report (A)  Final    Comment: (NOTE) Performed At: [facility] [address] [place], [place] [id number] Nagendra Sanjai MD Ph:[phone]    Source (MIC2) [phone],[id number]  Final    Comment: Performed at [facility], [address] [place], [place] [address]  MIC Results (2 Drugs)     Status: Abnormal   Collection Time: 04/08/22  7:12 PM  Result Value Ref Range Status   RESULT 5 MIC (RESULT 1)                Base Name: Comment (A)  Final    Comment: (NOTE) Methicillin - resistant Staphylococcus aureus Identification performed by account, not confirmed by this laboratory. CEFTAROLINE    <=1 UG/ML = SUSCEPTIBLE Performed At: [facility] [place] [address] [place],  [id number] Nagendra Sanjai MD Ph:[phone]   SARS Coronavirus 2 by RT PCR (hospital order, performed in [place] hospital lab) *cepheid single result test* Anterior Nasal Swab     Status: None   Collection Time: 04/08/22  8:52 PM   Specimen: Anterior Nasal Swab  Result Value Ref Range Status   SARS Coronavirus 2 by RT PCR NEGATIVE NEGATIVE Final    Comment: (NOTE) SARS-CoV-2 target nucleic acids are NOT DETECTED.  The SARS-CoV-2 RNA is generally detectable in upper and lower respiratory  specimens during the acute phase of infection. The lowest concentration of SARS-CoV-2 viral copies this assay can detect is 250 copies / mL. A negative result does not preclude SARS-CoV-2 infection and should not be used as the sole basis for treatment or other patient management decisions.  A negative result may occur with improper specimen collection / handling, submission of specimen other than nasopharyngeal swab, presence of viral mutation(s) within the areas targeted by this assay, and inadequate number of viral copies (<250 copies / mL). A negative result must be combined with clinical observations, patient history, and epidemiological information.  Fact Sheet for Patients:   https://www.fda.gov/media/158405/download  Fact Sheet for Healthcare Providers: https://www.fda.gov/media/158404/download  This test is not yet approved or  cleared by the [place] FDA and has been authorized for detection and/or diagnosis of SARS-CoV-2 by FDA under an Emergency Use Authorization (EUA).  This EUA will remain in effect (meaning this test can be used) for the duration of the COVID-19 declaration under Section 564(b)(1) of the Act, 21 U.S.C. section 360bbb-3(b)(1), unless the authorization is terminated or revoked sooner.  Performed at [facility], [address] [place],  [address]   Culture, blood (Routine X 2) w Reflex to ID Panel     Status: None   Collection Time: 04/11/22 10:05 AM   Specimen: BLOOD RIGHT ARM  Result Value Ref Range Status   Specimen Description BLOOD RIGHT ARM  Final   Special Requests   Final    BOTTLES DRAWN AEROBIC AND ANAEROBIC Blood Culture results may not be optimal due   to an inadequate volume of blood received in culture bottles   Culture   Final    NO GROWTH 5 DAYS Performed at [facility], [address] [place], [place] [address]    Report Status 04/16/2022 FINAL  Final  Culture, blood (Routine X 2) w Reflex to ID Panel      Status: None   Collection Time: 04/11/22 10:05 AM   Specimen: BLOOD LEFT ARM  Result Value Ref Range Status   Specimen Description BLOOD LEFT ARM  Final   Special Requests   Final    BOTTLES DRAWN AEROBIC AND ANAEROBIC Blood Culture results may not be optimal due to an inadequate volume of blood received in culture bottles   Culture   Final    NO GROWTH 5 DAYS Performed at [facility], [address] [place], [place] [address]    Report Status 04/16/2022 FINAL  Final    RADIOLOGY STUDIES/RESULTS: No results found.   LOS: 52 days   Signature  Jeoffrey Massed M.D on 04/18/2022 at 11:10 AM   -  To page go to [email or web address]

## 2022-04-18 NOTE — Progress Notes (Signed)
PROGRESS NOTE        PATIENT DETAILS Name: Reginald Clarke Age: 31 y.o. Sex: male Date of Birth: October 01, 1990 Admit Date: 02/25/2022 Admitting Physician [name], MD [id number], Randa Evens, PA-C  Brief Summary: Patient is a 31 y.o.  male with history of recurrent MSSA native tricuspid valve endocarditis in spite of completing several courses of IV antibiotics.  Please see below for further details.  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  Underwent angio vac procedure on 4/26.Completed cefazolin on 5/10-planned weekly dalbavancin at the ID clinic.   5/22>> outpatient blood culture positive for MSSA 5/24-6/8>> admit to [facility] for persistent bacteremia-worsening back pain-discharge to [facility] on IV Ancef. 7/5>> discharged from [facility] on oral cefdinir.  Continued to have intermittent fever postdischarge.   7/24>> admit for-persistent fever/nausea/vomiting/diarrhea-blood cultures positive for MSSA. 9/5>> fever-repeat blood cultures growing Staph aureus supposed to finish IV antibiotic treatment 9/6 9/5 >> PICC line discontinued  Significant studies: 7/25>> CT chest: Multifocal airspace opacities-increasing cavitary nodules 7/25>> MRI thoracic spine: No evidence of infection in thoracic spine.  Significant microbiology data: 7/24>> urine culture: Negative 7/25>> blood culture: 1/2-MSSA. 7/27>> blood culture: No growth 7/29>> blood culture: No growth 9/04>> blood cultures:MRSA 9/07>> blood cultures:neg  Procedures: 8/3>> TEE: 3 vegetations on tricuspid valve-severe TR.  EF 60-65%  Consults: ID CT suregery Dentist   Subjective:  Lying comfortably in bed-no chest pain or shortness of breath.  Objective: Vitals: Blood pressure 110/70, pulse 96, temperature 98.5 F (36.9 C), temperature source Oral, resp. rate 18, height 5\' 10"  (1.778 m), weight 80.9 kg, SpO2 96 %.   Exam: Gen  Exam:Alert awake-not in any distress HEENT:atraumatic, normocephalic Chest: B/L clear to auscultation anteriorly CVS:S1S2 regular Abdomen:soft non tender, non distended Extremities:no edema Neurology: Non focal Skin: no rash   Assessment/Plan: Sepsis due to recurrent MSSA bacteremia with native tricuspid valve endocarditis: Failed IV antibiotic treatment-dental surgery planning teeth extraction today-following which cardiothoracic surgery will eval for possible valve replacement next week.  In the meantime-continue with IV antibiotics.    Normocytic anemia: Due to chronic disease/recurrent endocarditis: No indication for transfusion.  Anxiety: Stable-on Lexapro-Xanax be slowly weaned down.  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  Mild transaminitis: Downtrending-monitor periodically.  Nutrition Status: Nutrition Problem: Inadequate oral intake Etiology: vomiting, nausea Signs/Symptoms: per patient/family report Interventions: Ensure Enlive (each supplement provides 350kcal and 20 grams of protein), MVI, Snacks  BMI: Estimated body mass index is 25.59 kg/m as calculated from the following:   Height as of this encounter: 5\' 10"  (1.778 m).   Weight as of this encounter: 80.9 kg.   Code status:   Code Status: Full Code   DVT Prophylaxis:    Family Communication: None at bedside   Disposition Plan: Status is: Inpatient    Diet: Diet Order             Diet NPO time specified  Diet effective midnight                    MEDICATIONS: Scheduled Meds:  escitalopram  10 mg Oral Daily   feeding supplement  237 mL Oral BID BM   methadone  135 mg Oral Daily   multivitamin with minerals  1 tablet Oral Daily   nicotine  14 mg  Transdermal Daily   traZODone  50 mg Oral QHS   Continuous Infusions:  ceFTAROline (TEFLARO) IV 600 mg (04/18/22 0504)   DAPTOmycin (CUBICIN) 650 mg in sodium chloride 0.9 %  IVPB Stopped (04/17/22 2300)   sodium chloride     PRN Meds:.acetaminophen, ALPRAZolam, alum & mag hydroxide-simeth, bisacodyl, diclofenac Sodium, docusate sodium, hydrOXYzine, ibuprofen, loperamide, methocarbamol, polyethylene glycol, promethazine, sodium chloride, sodium chloride flush   I have personally reviewed following labs and imaging studies  LABORATORY DATA: Recent Labs  Lab 04/12/22 0304 04/13/22 1400 04/14/22 0224 04/15/22 0705 04/16/22 0712 04/17/22 0304  WBC 7.2 6.1 7.2 5.0 7.1 6.0  HGB 8.3* 8.5* 9.0* 9.5* 8.3* 8.5*  HCT 25.6* 26.7* 28.4* 29.7* 25.9* 26.4*  PLT 100* 113* 153 151 179 178  MCV 83.9 84.8 84.0 84.4 83.8 83.8  MCH 27.2 27.0 26.6 27.0 26.9 27.0  MCHC 32.4 31.8 31.7 32.0 32.0 32.2  RDW 16.9* 17.0* 16.7* 16.6* 16.6* 16.8*  LYMPHSABS 1.5 1.4 2.0 1.7 1.8  --   MONOABS 0.9 0.6 0.8 0.5 0.6  --   EOSABS 0.1 0.1 0.1 0.2 0.2  --   BASOSABS 0.0 0.0 0.0 0.0 0.0  --      Recent Labs  Lab 04/12/22 0304 04/13/22 1400 04/14/22 0224 04/15/22 0705 04/16/22 0712 04/17/22 0304  NA 134* 133* 136  --   --  137  K 3.6 5.2* 3.7  --   --  4.2  CL 99 99 102  --   --  103  CO2 25 22 24   --   --  26  GLUCOSE 99 91 98  --   --  95  BUN 15 13 13   --   --  10  CREATININE 0.79 0.82 0.93  --   --  0.80  CALCIUM 8.6* 8.7* 9.0  --   --  8.7*  AST 27 42* 31  --   --   --   ALT 58* 57* 59*  --   --   --   ALKPHOS 66 83 73  --   --   --   BILITOT 0.5 0.6 0.2*  --   --   --   ALBUMIN 2.7* 3.0* 2.9*  --   --   --   MG 1.9 2.0 2.0 2.3 2.1  --      MICROBIOLOGY: Recent Results (from the past 240 hour(s))  Culture, blood (Routine X 2) w Reflex to ID Panel     Status: Abnormal   Collection Time: 04/08/22  7:12 PM   Specimen: BLOOD  Result Value Ref Range Status   Specimen Description BLOOD BLOOD LEFT FOREARM  Final   Special Requests   Final    BOTTLES DRAWN AEROBIC AND ANAEROBIC Blood Culture results may not be optimal due to an inadequate volume of blood received in  culture bottles ARPEDB USED AS AEROBIC BOTTLE   Culture  Setup Time   Final    GRAM POSITIVE COCCI IN CLUSTERS IN BOTH AEROBIC AND ANAEROBIC BOTTLES CRITICAL RESULT CALLED TO, READ BACK BY AND VERIFIED WITH: PHARMD Olevia Bowens [id number] @1728  FH    Culture (A)  Final    STAPHYLOCOCCUS AUREUS SUSCEPTIBILITIES PERFORMED ON PREVIOUS CULTURE WITHIN THE LAST 5 DAYS. Performed at [facility], [place] [address] [place], [place] [address]    Report Status 04/12/2022 FINAL  Final  Culture, blood (Routine X 2) w Reflex to ID Panel     Status: Abnormal (Preliminary result)  Collection Time: 04/08/22  7:12 PM   Specimen: BLOOD  Result Value Ref Range Status   Specimen Description BLOOD BLOOD LEFT FOREARM  Final   Special Requests   Final    BOTTLES DRAWN AEROBIC AND ANAEROBIC Blood Culture results may not be optimal due to an inadequate volume of blood received in culture bottles ARPEDB USED AS AEROBIC   Culture  Setup Time   Final    GRAM POSITIVE COCCI IN CLUSTERS IN BOTH AEROBIC AND ANAEROBIC BOTTLES CRITICAL RESULT CALLED TO, READ BACK BY AND VERIFIED WITH: PHARMD MADELEINE MITCHELL ON 04/09/22 @ 1625 BY DRT    Culture (A)  Final    METHICILLIN RESISTANT STAPHYLOCOCCUS AUREUS RETEST CONFIRMED MODIFICATION OF PBP (MECA) Sent to [facility] for further susceptibility testing. Performed at [facility], [address] [place], [place] [address]    Report Status PENDING  Incomplete   Organism ID, Bacteria METHICILLIN RESISTANT STAPHYLOCOCCUS AUREUS  Final      Susceptibility   Methicillin resistant staphylococcus aureus - MIC*    CIPROFLOXACIN <=0.5 SENSITIVE Sensitive     ERYTHROMYCIN >=8 RESISTANT Resistant     GENTAMICIN <=0.5 SENSITIVE Sensitive     OXACILLIN RESISTANT Resistant     TETRACYCLINE <=1 SENSITIVE Sensitive     VANCOMYCIN 2 SENSITIVE Sensitive     TRIMETH/SULFA <=10 SENSITIVE Sensitive     CLINDAMYCIN <=0.25 SENSITIVE Sensitive     RIFAMPIN <=0.5 SENSITIVE Sensitive      Inducible Clindamycin NEGATIVE Sensitive     * METHICILLIN RESISTANT STAPHYLOCOCCUS AUREUS  Blood Culture ID Panel (Reflexed)     Status: Abnormal   Collection Time: 04/08/22  7:12 PM  Result Value Ref Range Status   Enterococcus faecalis NOT DETECTED NOT DETECTED Final   Enterococcus Faecium NOT DETECTED NOT DETECTED Final   Listeria monocytogenes NOT DETECTED NOT DETECTED Final   Staphylococcus species DETECTED (A) NOT DETECTED Final    Comment: CRITICAL RESULT CALLED TO, READ BACK BY AND VERIFIED WITH: PHARMD MADELEINE MITCHELL ON 04/09/22 @ 1625 BY DRT    Staphylococcus aureus (BCID) DETECTED (A) NOT DETECTED Final    Comment: CRITICAL RESULT CALLED TO, READ BACK BY AND VERIFIED WITH: PHARMD MADELEINE MITCHELL ON 04/09/22 @ 1625 BY DRT    Staphylococcus epidermidis NOT DETECTED NOT DETECTED Final   Staphylococcus lugdunensis NOT DETECTED NOT DETECTED Final   Streptococcus species NOT DETECTED NOT DETECTED Final   Streptococcus agalactiae NOT DETECTED NOT DETECTED Final   Streptococcus pneumoniae NOT DETECTED NOT DETECTED Final   Streptococcus pyogenes NOT DETECTED NOT DETECTED Final   A.calcoaceticus-baumannii NOT DETECTED NOT DETECTED Final   Bacteroides fragilis NOT DETECTED NOT DETECTED Final   Enterobacterales NOT DETECTED NOT DETECTED Final   Enterobacter cloacae complex NOT DETECTED NOT DETECTED Final   Escherichia coli NOT DETECTED NOT DETECTED Final   Klebsiella aerogenes NOT DETECTED NOT DETECTED Final   Klebsiella oxytoca NOT DETECTED NOT DETECTED Final   Klebsiella pneumoniae NOT DETECTED NOT DETECTED Final   Proteus species NOT DETECTED NOT DETECTED Final   Salmonella species NOT DETECTED NOT DETECTED Final   Serratia marcescens NOT DETECTED NOT DETECTED Final   Haemophilus influenzae NOT DETECTED NOT DETECTED Final   Neisseria meningitidis NOT DETECTED NOT DETECTED Final   Pseudomonas aeruginosa NOT DETECTED NOT DETECTED Final   Stenotrophomonas maltophilia NOT  DETECTED NOT DETECTED Final   Candida albicans NOT DETECTED NOT DETECTED Final   Candida auris NOT DETECTED NOT DETECTED Final   Candida glabrata NOT DETECTED NOT  DETECTED Final   Candida krusei NOT DETECTED NOT DETECTED Final   Candida parapsilosis NOT DETECTED NOT DETECTED Final   Candida tropicalis NOT DETECTED NOT DETECTED Final   Cryptococcus neoformans/gattii NOT DETECTED NOT DETECTED Final   Meth resistant mecA/C and MREJ NOT DETECTED NOT DETECTED Final    Comment: Performed at [facility], [address], [place], [place] [address]  Min Inhibitory Conc (2 Drugs)     Status: Abnormal   Collection Time: 04/08/22  7:12 PM  Result Value Ref Range Status   Min Inhibitory Conc (2 Drugs) Preliminary report (A)  Final    Comment: (NOTE) Performed At: [facility] [place], [place] [id number] Rush Farmer MD [id number]    Source ([place]) [phone]  Final    Comment: Performed at [facility], [place] [address], [place], [place] [address]  MIC Results (2 Drugs)     Status: Abnormal   Collection Time: 04/08/22  7:12 PM  Result Value Ref Range Status   RESULT 5 MIC (RESULT 1)                Base Name: Comment (A)  Final    Comment: (NOTE) Methicillin - resistant Staphylococcus aureus Identification performed by account, not confirmed by this laboratory. CEFTAROLINE    <=1 UG/ML = SUSCEPTIBLE Performed At: [facility] [address] [place], [place] [id number] Rush Farmer MD [id number]   SARS Coronavirus 2 by RT PCR (hospital order, performed in [facility] hospital lab) *cepheid single result test* Anterior Nasal Swab     Status: None   Collection Time: 04/08/22  8:52 PM   Specimen: Anterior Nasal Swab  Result Value Ref Range Status   SARS Coronavirus 2 by RT PCR NEGATIVE NEGATIVE Final    Comment: (NOTE) SARS-CoV-2 target nucleic acids are NOT DETECTED.  The SARS-CoV-2 RNA is generally detectable in upper and lower respiratory  specimens during the acute phase of infection. The lowest concentration of SARS-CoV-2 viral copies this assay can detect is 250 copies / mL. A negative result does not preclude SARS-CoV-2 infection and should not be used as the sole basis for treatment or other patient management decisions.  A negative result may occur with improper specimen collection / handling, submission of specimen other than nasopharyngeal swab, presence of viral mutation(s) within the areas targeted by this assay, and inadequate number of viral copies (<250 copies / mL). A negative result must be combined with clinical observations, patient history, and epidemiological information.  Fact Sheet for Patients:   https://www.patel.info/  Fact Sheet for Healthcare Providers: https://hall.com/  This test is not yet approved or  cleared by the [place] FDA and has been authorized for detection and/or diagnosis of SARS-CoV-2 by FDA under an Emergency Use Authorization (EUA).  This EUA will remain in effect (meaning this test can be used) for the duration of the COVID-19 declaration under Section 564(b)(1) of the Act, 21 U.S.C. section 360bbb-3(b)(1), unless the authorization is terminated or revoked sooner.  Performed at [facility], [place] [address], [place], [place] [address]   Culture, blood (Routine X 2) w Reflex to ID Panel     Status: None   Collection Time: 04/11/22 10:05 AM   Specimen: BLOOD RIGHT ARM  Result Value Ref Range Status   Specimen Description BLOOD RIGHT ARM  Final   Special Requests   Final    BOTTLES DRAWN AEROBIC AND ANAEROBIC Blood Culture results may not be optimal due  to an inadequate volume of blood received in culture bottles   Culture   Final    NO GROWTH 5 DAYS Performed at [facility], [address] [place], [place] [address]    Report Status 04/16/2022 FINAL  Final  Culture, blood (Routine X 2) w Reflex to ID Panel      Status: None   Collection Time: 04/11/22 10:05 AM   Specimen: BLOOD LEFT ARM  Result Value Ref Range Status   Specimen Description BLOOD LEFT ARM  Final   Special Requests   Final    BOTTLES DRAWN AEROBIC AND ANAEROBIC Blood Culture results may not be optimal due to an inadequate volume of blood received in culture bottles   Culture   Final    NO GROWTH 5 DAYS Performed at [facility], [address] [place], [place] [address]    Report Status 04/16/2022 FINAL  Final    RADIOLOGY STUDIES/RESULTS: No results found.   LOS: 52 days   Signature  Jeoffrey Massed M.D on 04/18/2022 at 11:10 AM   -  To page go to [email or web address]

## 2022-04-18 NOTE — Anesthesia Procedure Notes (Signed)
Procedure Name: Intubation Date/Time: 04/18/2022 3:24 PM  Performed by: Cheree Ditto, CRNAPre-anesthesia Checklist: Patient identified, Emergency Drugs available, Suction available and Patient being monitored Patient Re-evaluated:Patient Re-evaluated prior to induction Oxygen Delivery Method: Circle system utilized Preoxygenation: Pre-oxygenation with 100% oxygen Induction Type: IV induction Ventilation: Mask ventilation without difficulty Laryngoscope Size: Glidescope and 4 Grade View: Grade I Nasal Tubes: Nasal prep performed, Nasal Rae and Left Tube size: 7.5 mm Number of attempts: 1 Placement Confirmation: ETT inserted through vocal cords under direct vision, positive ETCO2 and breath sounds checked- equal and bilateral Tube secured with: Tape Dental Injury: Teeth and Oropharynx as per pre-operative assessment  Comments: Sprayed afrin  X2 per record.  Dilated with NPA 6.0 ,7.0, 8.0 left nare.  All easily passed.  Nasal rae easily passed witho glidescope.  No magill required.

## 2022-04-18 NOTE — Op Note (Signed)
[facility] Department of Dental Medicine   OPERATIVE REPORT  DATE OF SURGERY:   04/18/2022  PATIENT'S NAME:   Reginald Clarke DATE OF BIRTH:   August 17, 1990 MEDICAL RECORD NUMBER:  [id number]  SURGEON:   Dyllon Henken B. Isis Costanza, DMD  ASSISTANT:   Pearletha Alfred, DAII  PREOPERATIVE DIAGNOSES:  Severe dental caries  Patient Active Problem List   Diagnosis Date Noted   Encounter for preoperative dental examination    Caries    Retained tooth root    Chronic apical periodontitis    Chronic periodontitis    Accretions on teeth    Impacted third molar tooth    Teeth missing    Phobia of dental procedure    Thrombocytopenia ([name])    Pneumonia of both lower lobes due to methicillin resistant Staphylococcus aureus (MRSA) ([name])    IVDU (intravenous drug user)    MSSA bacteremia 02/28/2022   Hypokalemia 02/27/2022   Hypomagnesemia 02/27/2022   Hyponatremia 02/27/2022   Subacute bacterial endocarditis 02/27/2022   Sepsis ([name])    History of bacterial endocarditis    Elevated serum creatinine    Normocytic anemia 12/26/2021   Prolonged QT interval 12/26/2021   Fever 12/25/2021   Smoking 12/25/2021   Anxiety 12/05/2021   Septic pulmonary embolism ([name]) 11/25/2021   Hepatitis C, chronic ([name]) 11/24/2021   Polysubstance abuse ([name]) 11/17/2021   Tobacco abuse 11/15/2021   Endocarditis of tricuspid valve 11/15/2021   MRSA bacteremia 11/14/2021   Chronic pain syndrome/methadone abuse  11/14/2021   GERD (gastroesophageal reflux disease) 11/14/2021    POSTOPERATIVE DIAGNOSES:  Severe dental caries   PROCEDURES PERFORMED: Extractions of teeth numbers 1, 2, 4, 5, 6, 8, 9, 10, 11, 12, 15, 16, 20, 22, 24, 25, 26 and 29 2 Quadrants of alveoloplasty (Upper Right and Upper Left)  ANESTHESIA:  General anesthesia via nasal endotracheal tube.  MEDICATIONS: Ancef 2 g IV prior to invasive dental procedures. Local anesthesia with a total utilization of 3 cartridges of 34 mg  of lidocaine with 0.018 mg of epinephrine/ea as well as 1 cartridges with 9 mg of bupivacaine with 0.009 mg of epinephrine/ea.  SPECIMENS:  18 teeth that were extracted and discarded  DRAINS/CULTURES:  None  COMPLICATIONS:  None  ESTIMATED BLOOD LOSS:  5 mL  INTRAVENOUS FLUIDS:  10 mL of Lactated ringers solution  INDICATIONS:  The patient was recently diagnosed with recurrent tricuspid valve endocarditis.  A medically necessary dental consult was then requested to evaluate the patient for any dental/orofacial infection and their overall oral health.  The patient was examined and subsequently treatment planned for full mouth extractions.  This treatment plan was made to decrease the perioperative and postoperative risks and complications associated with dental/orofacial infection from affecting the patient's systemic health.  OPERATIVE FINDINGS:  The patient was examined in operating room number 10.  The indicated teeth were identified and verified for extraction. The patient was noted be affected by severe dental decay, retained tooth roots and chronic apical periodontitis.  DESCRIPTION OF PROCEDURE:  The patient was identified in the holding area and brought to the main operating room number 10 by the anesthesia team. The patient was then placed in the supine position on the operating table.  General anesthesia was then induced per the anesthesia team. The patient was then prepped and draped in the usual sterile fashion for dental medicine procedures.  A timeout was performed. The patient was identified and procedures were verified. A throat pack was placed at this  time. The oral cavity was then thoroughly examined with the findings noted above. The patient was then ready for the dental medicine procedure as follows:   ANESTHESIA: Local anesthesia was administered sequentially with a total utilization of 3 cartridges each containing 34 mg of lidocaine with 0.018 mg of epinephrine as well as 1  cartridge each containing 9 mg bupivacaine with 0.009 mg of epinephrine.  Location of anesthesia included upper right and left quadrants infiltration and palatal, and lower right and lower left quadrants mental nerve block, buccal and lingual infiltration.  Aspiration was negative.  ROUTINE EXTRACTIONS: The maxillary left and right quadrants were first approached. The teeth were then subluxated with a series of straight elevators.  Teeth numbers 1, 2, 4, 5, 6, 8, 9, 10, 11, 12, 15 and 16 were then removed with a 150 forceps and rongeurs without complications.  Alveoloplasty was then performed utilizing a ronguers and bone file.  The tissues were approximated and trimmed appropriately to help achieve primary closure.  The surgical sites were then irrigated with copious amounts of sterile saline.  Surgi Foam was placed in each extraction site.  The surgical sites were closed using 3-0 chromic gut sutures as follows:  2 figure-8 style sutures and 4 simple interrupted.  The mandibular left and right quadrants were then approached. The teeth were subluxated with a series of straight elevators.  Teeth numbers 20, 22, 24, 25, 26 and 29 were then removed utilizing a 151 forceps without complications.  The tissues were approximated and trimmed appropriately to help achieve primary closure. The surgical sites were then irrigated with copious amounts of sterile saline.  Surgi Foam was placed in each extraction site.  The surgical sites were closed using 3-0 chromic gut sutures as follows: 2 figure-8 style sutures to close papilla of anterior extraction sites.   END OF PROCEDURE: Thorough oral irrigation with sterile saline was performed.  Hemostasis was observed.  The patient was examined for complications, and seeing none, the dental medicine procedure was deemed to be complete.  The throat pack was removed at this time.  A series of 4x4 gauze were placed in the mouth to aid hemostasis as needed.  The patient was  then handed over to the anesthesia team for final disposition.  After an appropriate amount of time, the patient was extubated and taken to the postanesthsia care unit in stable condition.  All counts were correct for the dental medicine procedure.    Sharnika Binney B. Chales Salmon, DMD    Phone: [phone]    Pager:  [phone]

## 2022-04-18 NOTE — Transfer of Care (Signed)
Immediate Anesthesia Transfer of Care Note  Patient: Reginald Clarke  Procedure(s) Performed: MULTIPLE EXTRACTION WITH ALVEOLOPLASTY  Patient Location: PACU  Anesthesia Type:General  Level of Consciousness: awake, alert  and oriented  Airway & Oxygen Therapy: Patient Spontanous Breathing  Post-op Assessment: Report given to RN and Post -op Vital signs reviewed and stable  Post vital signs: Reviewed and stable  Last Vitals:  Vitals Value Taken Time  BP 123/71 04/18/22 1707  Temp    Pulse 90 04/18/22 1708  Resp 13 04/18/22 1708  SpO2 95 % 04/18/22 1708  Vitals shown include unvalidated device data.  Last Pain:  Vitals:   04/18/22 1424  TempSrc: Oral  PainSc:       Patients Stated Pain Goal: 0 (03/30/22 0831)  Complications: No notable events documented.

## 2022-04-18 NOTE — Discharge Instructions (Addendum)
[facility] DEPARTMENT OF DENTAL MEDICINE Dr. Alda Berthold, DMD Phone: [phone] Fax: [phone]   MOUTH CARE AFTER SURGERY    FACTS: Ice used in ice bag helps keep the swelling down, and can help lessen the pain for the first 24 hours after surgery. It is easier to treat pain BEFORE it happens. Spitting disturbs the clot and may cause bleeding to start again, or to get worse. Smoking delays healing and can cause complications. Sharing prescriptions can be dangerous.  Do not take medications not recently prescribed for you. Antibiotics may stop birth control pills from working.  Use other means of birth control while on antibiotics. Warm salt water rinses after the first 24 hours will help lessen the swelling:  Use 1/2 teaspoonful of table salt per oz.of water.    DO NOT: Spit Drink through a straw It is strongly advised not to smoke, dip snuff or chew tobacco for at least 3 days. Eat sharp or crunchy foods.  Avoid the area of surgery when chewing. Stop your antibiotics before your instructions say to do so. Eat hot foods until bleeding has stopped.  If you need to, let your food cool down to room temperature.      WHAT TO EXPECT: Some swelling, especially during the first 2-3 days. Soreness or discomfort in varying degrees.  Follow your dentist's instructions about how to handle pain before it starts. Pinkish saliva or light blood in saliva, or on your pillow in the morning.  This can last around 24 hours. Bruising inside or outside the mouth.  This may not show up until 2-3 days after surgery.  Don't worry, it will go away in time. Pieces of "bone" may work themselves loose.  It's OK.  If they bother you, let us know.     WHAT TO DO IMMEDIATELY AFTER SURGERY: Bite on gauze with steady pressure for 30-45 minutes at a time.  Switch out the gauze after 30-45 minutes for clean gauze, and continue this for 1-2 hours or until bleeding  subsides. Do not chew on the gauze. Do not lie down flat.  Raise your head support especially for the first 24 hours. Apply ice to your face on the side of the surgery.  You may apply it 20 minutes on and a few minutes off.  Ice for 8-12 hours.  You may use ice up to 24 hours. Before the numbness wears off, take a pain pill as instructed. Prescription pain medication is not always required.     SWELLING: Expect swelling for the first couple of days.  It should get better after that. If swelling increases 3 days or so after surgery, let us know as soon as possible.    FEVER: Take Tylenol every 4 hours if needed to lower your temperature, especially if it is at 100oF or higher. Drink lots of fluids. If the fever does not go away, let us know.    BREATHING: Any unusual difficulty breathing means you have to have someone bring you to the emergency room ASAP.    BLEEDING: Light oozing is expected for 24 hours or so. Prop head up with pillows. Do not spit. Do not confuse bright red fresh flowing blood with lots of saliva colored with a little bit of blood. If you notice some bleeding, place gauze or a tea bag where it is bleeding and apply CONSTANT pressure by biting down for 1 hour.  Avoid talking during this time.  Do not remove  the gauze or tea bag during this hour to "check" the bleeding. If you notice bright RED bleeding FLOWING out of particular area, and filling the floor of your mouth, put a wad of gauze on that area, bite down firmly and constantly.  Call us immediately.  If we're closed, have someone bring you to the emergency room.     ORAL HYGIENE: Brush your teeth as usual after meals and before bedtime. Use a soft toothbrush around the area of surgery. DO NOT AVOID BRUSHING.  Otherwise bacteria(germs) will grow and may delay healing or encourage infection. Since you cannot spit, just gently rinse and let the water flow out of your mouth. DO NOT SWISH HARD.      EATING: Cool liquids are a good point to start.  Increase to soft foods as tolerated.     PRESCRIPTIONS: Follow the directions for your prescriptions exactly as written. If your doctor gave you a narcotic pain medication, do not drive, operate machinery or drink alcohol when on that medication.    SINUS PRECAUTIONS AFTER ORAL SURGERY   AVOID: Blowing your nose:  It is best to wipe away nasal secretions carefully. After 2 weeks, if you must blow your nose, blow gently through both sides at the same time. Do not pinch your nose; do not blow just one side at a time. Sneezing:  If you must sneeze, keep your mouth open and do not pinch your nose closed. Sucking:  Do not drink through a straw. Do not smoke.  Blowing:  Do not play a wind instrument. Do not blow a balloons. Pushing or lifting: Do not lift or push objects weighing more than 20 pounds. Bending over: Keeping her head above the level of your heart. Sleep with your head slightly raised.   NOTIFY YOUR DENTIST IF: You bleed from your nose.  If you see bleeding from your nose, have neck stiffness, or increased sensitivity to bright light or severe headache, call Dental Medicine or proceed to the Emergency Department for evaluation immediately. You are unable to take any of your medications as prescribed.  You may be advised to take an antibiotic and decongestant as well as your regular pain medication. You must take these medications as prescribed. Do not stop taking them on her own. If you have a problem with any medication, please call us so that we can make an adjustment for you.   QUESTIONS? 1. Dental Medicine: [phone], M-TH 8AM-5PM and F 8AM-12PM 2. [facility] Emergency Department: [phone]   Information on my medicine - Coumadin   (Warfarin) Why was Coumadin prescribed for you? Coumadin was prescribed for you because you have a blood clot or a medical condition that can cause an increased risk of forming blood  clots. Blood clots can cause serious health problems by blocking the flow of blood to the heart, lung, or brain. Coumadin can prevent harmful blood clots from forming. As a reminder your indication for Coumadin is:  Blood Clot Prevention after Heart Valve Surgery  What test will check on my response to Coumadin? While on Coumadin (warfarin) you will need to have an INR test regularly to ensure that your dose is keeping you in the desired range. The INR (international normalized ratio) number is calculated from the result of the laboratory test called prothrombin time (PT).  If an INR APPOINTMENT HAS NOT ALREADY BEEN MADE FOR YOU please schedule an appointment to have this lab work done by your health care provider within 7 days.  Your INR goal is usually a number between:  2 to 3 or your provider may give you a more narrow range like 2-2.5.  Ask your health care provider during an office visit what your goal INR is.  What  do you need to  know  About  COUMADIN? Take Coumadin (warfarin) exactly as prescribed by your healthcare provider about the same time each day.  DO NOT stop taking without talking to the doctor who prescribed the medication.  Stopping without other blood clot prevention medication to take the place of Coumadin may increase your risk of developing a new clot or stroke.  Get refills before you run out.  What do you do if you miss a dose? If you miss a dose, take it as soon as you remember on the same day then continue your regularly scheduled regimen the next day.  Do not take two doses of Coumadin at the same time.  Important Safety Information A possible side effect of Coumadin (Warfarin) is an increased risk of bleeding. You should call your healthcare provider right away if you experience any of the following: Bleeding from an injury or your nose that does not stop. Unusual colored urine (red or dark brown) or unusual colored stools (red or black). Unusual bruising for unknown  reasons. A serious fall or if you hit your head (even if there is no bleeding).  Some foods or medicines interact with Coumadin (warfarin) and might alter your response to warfarin. To help avoid this: Eat a balanced diet, maintaining a consistent amount of Vitamin K. Notify your provider about major diet changes you plan to make. Avoid alcohol or limit your intake to 1 drink for women and 2 drinks for men per day. (1 drink is 5 oz. wine, 12 oz. beer, or 1.5 oz. liquor.)  Make sure that ANY health care provider who prescribes medication for you knows that you are taking Coumadin (warfarin).  Also make sure the healthcare provider who is monitoring your Coumadin knows when you have started a new medication including herbals and non-prescription products.  Coumadin (Warfarin)  Major Drug Interactions  Increased Warfarin Effect Decreased Warfarin Effect  Alcohol (large quantities) Antibiotics (esp. Septra/Bactrim, Flagyl, Cipro) Amiodarone (Cordarone) Aspirin (ASA) Cimetidine (Tagamet) Megestrol (Megace) NSAIDs (ibuprofen, naproxen, etc.) Piroxicam (Feldene) Propafenone (Rythmol SR) Propranolol (Inderal) Isoniazid (INH) Posaconazole (Noxafil) Barbiturates (Phenobarbital) Carbamazepine (Tegretol) Chlordiazepoxide (Librium) Cholestyramine (Questran) Griseofulvin Oral Contraceptives Rifampin Sucralfate (Carafate) Vitamin K   Coumadin (Warfarin) Major Herbal Interactions  Increased Warfarin Effect Decreased Warfarin Effect  Garlic Ginseng Ginkgo biloba Coenzyme Q10 Green tea St. John's wort    Coumadin (Warfarin) FOOD Interactions  Eat a consistent number of servings per week of foods HIGH in Vitamin K (1 serving =  cup)  Collards (cooked, or boiled & drained) Kale (cooked, or boiled & drained) Mustard greens (cooked, or boiled & drained) Parsley *serving size only =  cup Spinach (cooked, or boiled & drained) Swiss chard (cooked, or boiled & drained) Turnip greens  (cooked, or boiled & drained)  Eat a consistent number of servings per week of foods MEDIUM-HIGH in Vitamin K (1 serving = 1 cup)  Asparagus (cooked, or boiled & drained) Broccoli (cooked, boiled & drained, or raw & chopped) Brussel sprouts (cooked, or boiled & drained) *serving size only =  cup Lettuce, raw (green leaf, endive, romaine) Spinach, raw Turnip greens, raw & chopped   These websites have more information on Coumadin (warfarin):  http://www.king-russell.com/; https://www.hines.net/;  Follow with Primary MD  Allwardt, Alyssa M, PA-C in 2-3 days   Get CBC, CMP, B12, TSH, INR -  checked next visit within 2-3 days by Primary MD   Activity: As tolerated with Full fall precautions use walker/cane & assistance as needed  Disposition Home   Diet: Heart Healthy    Special Instructions: If you have smoked or chewed Tobacco  in the last 2 yrs please stop smoking, stop any regular Alcohol  and or any Recreational drug use.  On your next visit with your primary care physician please Get Medicines reviewed and adjusted.  Please request your Prim.MD to go over all Hospital Tests and Procedure/Radiological results at the follow up, please get all Hospital records sent to your Prim MD by signing hospital release before you go home.  If you experience worsening of your admission symptoms, develop shortness of breath, life threatening emergency, suicidal or homicidal thoughts you must seek medical attention immediately by calling 911 or calling your MD immediately  if symptoms less severe.  You Must read complete instructions/literature along with all the possible adverse reactions/side effects for all the Medicines you take and that have been prescribed to you. Take any new Medicines after you have completely understood and accpet all the possible adverse reactions/side effects.

## 2022-04-18 NOTE — Anesthesia Preprocedure Evaluation (Addendum)
Anesthesia Evaluation  Patient identified by MRN, date of birth, ID band Patient awake    Reviewed: Allergy & Precautions, NPO status , Patient's Chart, lab work & pertinent test results  Airway Mallampati: II  TM Distance: >3 FB     Dental   Pulmonary pneumonia, former smoker,    breath sounds clear to auscultation       Cardiovascular negative cardio ROS   Rhythm:Regular Rate:Normal     Neuro/Psych PSYCHIATRIC DISORDERS    GI/Hepatic GERD  ,(+) Hepatitis -  Endo/Other  negative endocrine ROS  Renal/GU negative Renal ROS     Musculoskeletal   Abdominal   Peds  Hematology   Anesthesia Other Findings   Reproductive/Obstetrics                             Anesthesia Physical Anesthesia Plan  ASA: 3  Anesthesia Plan: General   Post-op Pain Management:    Induction: Intravenous  PONV Risk Score and Plan: 2 and Ondansetron and Dexamethasone  Airway Management Planned: Oral ETT  Additional Equipment:   Intra-op Plan:   Post-operative Plan: Extubation in OR  Informed Consent: I have reviewed the patients History and Physical, chart, labs and discussed the procedure including the risks, benefits and alternatives for the proposed anesthesia with the patient or authorized representative who has indicated his/her understanding and acceptance.     Dental advisory given  Plan Discussed with: CRNA and Anesthesiologist  Anesthesia Plan Comments:       Anesthesia Quick Evaluation

## 2022-04-19 ENCOUNTER — Encounter (HOSPITAL_COMMUNITY): Payer: Self-pay | Admitting: Dentistry

## 2022-04-19 DIAGNOSIS — I079 Rheumatic tricuspid valve disease, unspecified: Secondary | ICD-10-CM | POA: Diagnosis not present

## 2022-04-19 MED ORDER — ENOXAPARIN SODIUM 40 MG/0.4ML IJ SOSY
40.0000 mg | PREFILLED_SYRINGE | INTRAMUSCULAR | Status: DC
  Administered 2022-04-21 – 2022-04-24 (×2): 40 mg via SUBCUTANEOUS
  Filled 2022-04-19 (×2): qty 0.4

## 2022-04-19 NOTE — Progress Notes (Signed)
    [facility]   Reason for visit: follow up on TV endocarditis  Interval History: remain afebrile.  S/p tooth extractions.     Physical Exam: Constitutional:  Vitals:   04/18/22 1726 04/18/22 2000  BP: (!) 136/94 122/82  Pulse: 90 87  Resp: 18 17  Temp: 98.7 F (37.1 C) 98.5 F (36.9 C)  SpO2: 95% 96%   He is in nad Respiratory: normal respiratory effort  Review of Systems: Constitutional: negative for fever or chills.   Lab Results  Component Value Date   WBC 6.0 04/17/2022   HGB 8.5 (L) 04/17/2022   HCT 26.4 (L) 04/17/2022   MCV 83.8 04/17/2022   PLT 178 04/17/2022    Lab Results  Component Value Date   CREATININE 0.80 04/17/2022   BUN 10 04/17/2022   NA 137 04/17/2022   K 4.2 04/17/2022   CL 103 04/17/2022   CO2 26 04/17/2022    Lab Results  Component Value Date   ALT 59 (H) 04/14/2022   AST 31 04/14/2022   GGT 27 12/24/2021   ALKPHOS 73 04/14/2022     Microbiology: Recent Results (from the past 240 hour(s))  Culture, blood (Routine X 2) w Reflex to ID Panel     Status: None   Collection Time: 04/11/22 10:05 AM   Specimen: BLOOD RIGHT ARM  Result Value Ref Range Status   Specimen Description BLOOD RIGHT ARM  Final   Special Requests   Final    BOTTLES DRAWN AEROBIC AND ANAEROBIC Blood Culture results may not be optimal due to an inadequate volume of blood received in culture bottles   Culture   Final    NO GROWTH 5 DAYS Performed at [facility], [address] [place], [place] [address]    Report Status 04/16/2022 FINAL  Final  Culture, blood (Routine X 2) w Reflex to ID Panel     Status: None   Collection Time: 04/11/22 10:05 AM   Specimen: BLOOD LEFT ARM  Result Value Ref Range Status   Specimen Description BLOOD LEFT ARM  Final   Special Requests   Final    BOTTLES DRAWN AEROBIC AND ANAEROBIC Blood Culture results may not be optimal due to an inadequate volume of blood received in culture bottles    Culture   Final    NO GROWTH 5 DAYS Performed at [facility], 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  Repeat blood cultures 9/7 with no negative growth and final.   Plan for valve replacement at some point.    2.  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   3.  Poor dentition - tooth extractions completed.

## 2022-04-19 NOTE — Progress Notes (Signed)
PROGRESS NOTE        PATIENT DETAILS Name: Reginald Clarke Age: 31 y.o. Sex: male Date of Birth: 19-Apr-1991 Admit Date: 02/25/2022 Admitting Physician Dorcas Carrow, MD HQP:[id number], [name], PA-C  Brief Summary: Patient is a 31 y.o.  male with history of recurrent MSSA native tricuspid valve endocarditis in spite of completing several courses of IV antibiotics.  Please see below for further details.  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  Underwent angio vac procedure on 4/26.Completed cefazolin on 5/10-planned weekly dalbavancin at the ID clinic.   5/22>> outpatient blood culture positive for MSSA 5/24-6/8>> admit to [facility] for persistent bacteremia-worsening back pain-discharge to [facility] on IV Ancef. 7/5>> discharged from [facility] on oral cefdinir.  Continued to have intermittent fever postdischarge.   7/24>> admit for-persistent fever/nausea/vomiting/diarrhea-blood cultures positive for MSSA. 9/5>> fever-repeat blood cultures growing Staph aureus supposed to finish IV antibiotic treatment 9/6 9/5 >> PICC line discontinued  Significant studies: 7/25>> CT chest: Multifocal airspace opacities-increasing cavitary nodules 7/25>> MRI thoracic spine: No evidence of infection in thoracic spine.  Significant microbiology data: 7/24>> urine culture: Negative 7/25>> blood culture: 1/2-MSSA. 7/27>> blood culture: No growth 7/29>> blood culture: No growth 9/04>> blood cultures:MRSA 9/07>> blood cultures:neg  Procedures: 8/3>> TEE: 3 vegetations on tricuspid valve-severe TR.  EF 60-65% 9/14>> multiple teeth extraction  Consults: ID CT suregery Dentist   Subjective:  No major issues overnight.  Objective: Vitals: Blood pressure 122/82, pulse 87, temperature 98.5 F (36.9 C), temperature source Oral, resp. rate 17, height 5\' 10"  (1.778 m), weight 79.4 kg, SpO2 96 %.   Exam: Gen  Exam:Alert awake-not in any distress HEENT:atraumatic, normocephalic Chest: B/L clear to auscultation anteriorly CVS:S1S2 regular Abdomen:soft non tender, non distended Extremities:no edema Neurology: Non focal Skin: no rash   Assessment/Plan: Sepsis due to recurrent MSSA bacteremia with native tricuspid valve endocarditis: Failed IV antibiotic treatment-dental surgery performed multiple teeth extraction on 9/14-scheduled for valve replacement surgery this coming Tuesday.  Continue with IV antibiotics.    Normocytic anemia: Due to chronic disease/recurrent endocarditis: No indication for transfusion.  Anxiety: Stable-on Lexapro-Xanax be slowly weaned down.  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  Mild transaminitis: Downtrending-monitor periodically.  Nutrition Status: Nutrition Problem: Inadequate oral intake Etiology: vomiting, nausea Signs/Symptoms: per patient/family report Interventions: Ensure Enlive (each supplement provides 350kcal and 20 grams of protein), MVI, Snacks  BMI: Estimated body mass index is 25.11 kg/m as calculated from the following:   Height as of this encounter: 5\' 10"  (1.778 m).   Weight as of this encounter: 79.4 kg.   Code status:   Code Status: Full Code   DVT Prophylaxis: enoxaparin (LOVENOX) injection 40 mg Start: 04/20/22 1000   Family Communication: None at bedside   Disposition Plan: Status is: Inpatient    Diet: Diet Order             Diet clear liquid Room service appropriate? No; Fluid consistency: Thin  Diet effective now                    MEDICATIONS: Scheduled Meds:  [START ON 04/20/2022] enoxaparin (LOVENOX) injection  40 mg Subcutaneous Q24H   escitalopram  10 mg Oral Daily   feeding supplement  237 mL Oral BID BM   methadone  135 mg Oral Daily   multivitamin with minerals  1 tablet Oral Daily   nicotine  14 mg Transdermal Daily   traZODone  50  mg Oral QHS   Continuous Infusions:  ceFTAROline (TEFLARO) IV 600 mg (04/19/22 0456)   DAPTOmycin (CUBICIN) 650 mg in sodium chloride 0.9 % IVPB 650 mg (04/18/22 2059)   lactated ringers 10 mL/hr at 04/18/22 1829   sodium chloride     PRN Meds:.acetaminophen, ALPRAZolam, alum & mag hydroxide-simeth, bisacodyl, diclofenac Sodium, docusate sodium, hydrOXYzine, ibuprofen, loperamide, methocarbamol, oxyCODONE, polyethylene glycol, promethazine, sodium chloride, sodium chloride flush   I have personally reviewed following labs and imaging studies  LABORATORY DATA: Recent Labs  Lab 04/13/22 1400 04/14/22 0224 04/15/22 0705 04/16/22 0712 04/17/22 0304  WBC 6.1 7.2 5.0 7.1 6.0  HGB 8.5* 9.0* 9.5* 8.3* 8.5*  HCT 26.7* 28.4* 29.7* 25.9* 26.4*  PLT 113* 153 151 179 178  MCV 84.8 84.0 84.4 83.8 83.8  MCH 27.0 26.6 27.0 26.9 27.0  MCHC 31.8 31.7 32.0 32.0 32.2  RDW 17.0* 16.7* 16.6* 16.6* 16.8*  LYMPHSABS 1.4 2.0 1.7 1.8  --   MONOABS 0.6 0.8 0.5 0.6  --   EOSABS 0.1 0.1 0.2 0.2  --   BASOSABS 0.0 0.0 0.0 0.0  --      Recent Labs  Lab 04/13/22 1400 04/14/22 0224 04/15/22 0705 04/16/22 0712 04/17/22 0304  NA 133* 136  --   --  137  K 5.2* 3.7  --   --  4.2  CL 99 102  --   --  103  CO2 22 24  --   --  26  GLUCOSE 91 98  --   --  95  BUN 13 13  --   --  10  CREATININE 0.82 0.93  --   --  0.80  CALCIUM 8.7* 9.0  --   --  8.7*  AST 42* 31  --   --   --   ALT 57* 59*  --   --   --   ALKPHOS 83 73  --   --   --   BILITOT 0.6 0.2*  --   --   --   ALBUMIN 3.0* 2.9*  --   --   --   MG 2.0 2.0 2.3 2.1  --      MICROBIOLOGY: Recent Results (from the past 240 hour(s))  Culture, blood (Routine X 2) w Reflex to ID Panel     Status: None   Collection Time: 04/11/22 10:05 AM   Specimen: BLOOD RIGHT ARM  Result Value Ref Range Status   Specimen Description BLOOD RIGHT ARM  Final   Special Requests   Final    BOTTLES DRAWN AEROBIC AND ANAEROBIC Blood Culture results may not be  optimal due to an inadequate volume of blood received in culture bottles   Culture   Final    NO GROWTH 5 DAYS Performed at [facility], [address], [place], [place] [address]    Report Status 04/16/2022 FINAL  Final  Culture, blood (Routine X 2) w Reflex to ID Panel     Status: None   Collection Time: 04/11/22 10:05 AM   Specimen: BLOOD LEFT ARM  Result Value Ref Range Status   Specimen Description BLOOD LEFT ARM  Final   Special Requests   Final    BOTTLES DRAWN AEROBIC AND ANAEROBIC Blood Culture results may not be optimal due to an inadequate volume of  blood received in culture bottles   Culture   Final    NO GROWTH 5 DAYS Performed at [facility], [address], [place], [place] [address]    Report Status 04/16/2022 FINAL  Final    RADIOLOGY STUDIES/RESULTS: No results found.   LOS: 53 days   Signature  Jeoffrey Massed M.D on 04/19/2022 at 12:17 PM   -  To page go to [email or web address]

## 2022-04-20 DIAGNOSIS — I079 Rheumatic tricuspid valve disease, unspecified: Secondary | ICD-10-CM | POA: Diagnosis not present

## 2022-04-20 NOTE — Progress Notes (Signed)
PROGRESS NOTE        PATIENT DETAILS Name: Reginald Clarke Age: 31 y.o. Sex: male Date of Birth: Mar 17, 1991 Admit Date: 02/25/2022 Admitting Physician Dorcas Carrow, MD [id number], [name], PA-C  Brief Summary: Patient is a 31 y.o.  male with history of recurrent MSSA native tricuspid valve endocarditis in spite of completing several courses of IV antibiotics.  Please see below for further details.  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  Underwent angio vac procedure on 4/26.Completed cefazolin on 5/10-planned weekly dalbavancin at the ID clinic.   5/22>> outpatient blood culture positive for MSSA 5/24-6/8>> admit to [facility] for persistent bacteremia-worsening back pain-discharge to [facility] on IV Ancef. 7/5>> discharged from [facility] on oral cefdinir.  Continued to have intermittent fever postdischarge.   7/24>> admit for-persistent fever/nausea/vomiting/diarrhea-blood cultures positive for MSSA. 9/5>> fever-repeat blood cultures growing Staph aureus supposed to finish IV antibiotic treatment 9/6 9/5 >> PICC line discontinued  Significant studies: 7/25>> CT chest: Multifocal airspace opacities-increasing cavitary nodules 7/25>> MRI thoracic spine: No evidence of infection in thoracic spine.  Significant microbiology data: 7/24>> urine culture: Negative 7/25>> blood culture: 1/2-MSSA. 7/27>> blood culture: No growth 7/29>> blood culture: No growth 9/04>> blood cultures:MRSA 9/07>> blood cultures:neg  Procedures: 8/3>> TEE: 3 vegetations on tricuspid valve-severe TR.  EF 60-65% 9/14>> multiple teeth extraction  Consults: ID CT suregery Dentist   Subjective:  Lying comfortably in bed-no chest pain or shortness of breath.  Objective: Vitals: Blood pressure 111/68, pulse 78, temperature 97.7 F (36.5 C), temperature source Oral, resp. rate 16, height 5\' 10"  (1.778 m), weight 79.4  kg, SpO2 97 %.   Exam: Gen Exam:Alert awake-not in any distress HEENT:atraumatic, normocephalic Chest: B/L clear to auscultation anteriorly CVS:S1S2 regular Abdomen:soft non tender, non distended Extremities:no edema Neurology: Non focal Skin: no rash   Assessment/Plan: Sepsis due to recurrent MSSA bacteremia with native tricuspid valve endocarditis: Failed IV antibiotic treatment-dental surgery performed multiple teeth extraction on 9/14-discussed with Dr. 01-07-1988 on 9/15-scheduled for valve replacement surgery this coming Tuesday.  Continue with IV antibiotics.    Normocytic anemia: Due to chronic disease/recurrent endocarditis: No indication for transfusion.  Anxiety: Stable-on Lexapro-Xanax be slowly weaned down.  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  Mild transaminitis: Downtrending-monitor periodically.  Nutrition Status: Nutrition Problem: Inadequate oral intake Etiology: vomiting, nausea Signs/Symptoms: per patient/family report Interventions: Ensure Enlive (each supplement provides 350kcal and 20 grams of protein), MVI, Snacks  BMI: Estimated body mass index is 25.11 kg/m as calculated from the following:   Height as of this encounter: 5\' 10"  (1.778 m).   Weight as of this encounter: 79.4 kg.   Code status:   Code Status: Full Code   DVT Prophylaxis: enoxaparin (LOVENOX) injection 40 mg Start: 04/20/22 1000   Family Communication: None at bedside   Disposition Plan: Status is: Inpatient    Diet: Diet Order             DIET DYS 3 Room service appropriate? Yes; Fluid consistency: Thin  Diet effective now                    MEDICATIONS: Scheduled Meds:  enoxaparin (LOVENOX) injection  40 mg Subcutaneous Q24H   escitalopram  10 mg Oral Daily   feeding supplement  237  mL Oral BID BM   methadone  135 mg Oral Daily   multivitamin with minerals  1 tablet Oral Daily   nicotine   14 mg Transdermal Daily   traZODone  50 mg Oral QHS   Continuous Infusions:  ceFTAROline (TEFLARO) IV 600 mg (04/20/22 0556)   DAPTOmycin (CUBICIN) 650 mg in sodium chloride 0.9 % IVPB 650 mg (04/19/22 1947)   lactated ringers 10 mL/hr at 04/18/22 1829   sodium chloride     PRN Meds:.acetaminophen, ALPRAZolam, alum & mag hydroxide-simeth, bisacodyl, diclofenac Sodium, docusate sodium, hydrOXYzine, ibuprofen, loperamide, methocarbamol, oxyCODONE, polyethylene glycol, promethazine, sodium chloride, sodium chloride flush   I have personally reviewed following labs and imaging studies  LABORATORY DATA: Recent Labs  Lab 04/13/22 1400 04/14/22 0224 04/15/22 0705 04/16/22 0712 04/17/22 0304  WBC 6.1 7.2 5.0 7.1 6.0  HGB 8.5* 9.0* 9.5* 8.3* 8.5*  HCT 26.7* 28.4* 29.7* 25.9* 26.4*  PLT 113* 153 151 179 178  MCV 84.8 84.0 84.4 83.8 83.8  MCH 27.0 26.6 27.0 26.9 27.0  MCHC 31.8 31.7 32.0 32.0 32.2  RDW 17.0* 16.7* 16.6* 16.6* 16.8*  LYMPHSABS 1.4 2.0 1.7 1.8  --   MONOABS 0.6 0.8 0.5 0.6  --   EOSABS 0.1 0.1 0.2 0.2  --   BASOSABS 0.0 0.0 0.0 0.0  --      Recent Labs  Lab 04/13/22 1400 04/14/22 0224 04/15/22 0705 04/16/22 0712 04/17/22 0304  NA 133* 136  --   --  137  K 5.2* 3.7  --   --  4.2  CL 99 102  --   --  103  CO2 22 24  --   --  26  GLUCOSE 91 98  --   --  95  BUN 13 13  --   --  10  CREATININE 0.82 0.93  --   --  0.80  CALCIUM 8.7* 9.0  --   --  8.7*  AST 42* 31  --   --   --   ALT 57* 59*  --   --   --   ALKPHOS 83 73  --   --   --   BILITOT 0.6 0.2*  --   --   --   ALBUMIN 3.0* 2.9*  --   --   --   MG 2.0 2.0 2.3 2.1  --      MICROBIOLOGY: Recent Results (from the past 240 hour(s))  Culture, blood (Routine X 2) w Reflex to ID Panel     Status: None   Collection Time: 04/11/22 10:05 AM   Specimen: BLOOD RIGHT ARM  Result Value Ref Range Status   Specimen Description BLOOD RIGHT ARM  Final   Special Requests   Final    BOTTLES DRAWN AEROBIC AND  ANAEROBIC Blood Culture results may not be optimal due to an inadequate volume of blood received in culture bottles   Culture   Final    NO GROWTH 5 DAYS Performed at [facility], [address] [place], [place] [address]    Report Status 04/16/2022 FINAL  Final  Culture, blood (Routine X 2) w Reflex to ID Panel     Status: None   Collection Time: 04/11/22 10:05 AM   Specimen: BLOOD LEFT ARM  Result Value Ref Range Status   Specimen Description BLOOD LEFT ARM  Final   Special Requests   Final    BOTTLES DRAWN AEROBIC AND ANAEROBIC Blood Culture results may not  be optimal due to an inadequate volume of blood received in culture bottles   Culture   Final    NO GROWTH 5 DAYS Performed at [facility], [address] [place], [place] [address]    Report Status 04/16/2022 FINAL  Final    RADIOLOGY STUDIES/RESULTS: No results found.   LOS: 54 days   Signature  Jeoffrey Massed M.D on 04/20/2022 at 10:57 AM   -  To page go to [email or web address]

## 2022-04-21 DIAGNOSIS — I079 Rheumatic tricuspid valve disease, unspecified: Secondary | ICD-10-CM | POA: Diagnosis not present

## 2022-04-21 NOTE — Progress Notes (Signed)
PROGRESS NOTE        PATIENT DETAILS Name: Reginald Clarke Age: 31 y.o. Sex: male Date of Birth: September 21, 1990 Admit Date: 02/25/2022 Admitting Physician [name], MD ZOX:[id number], Randa Evens, PA-C  Brief Summary: Patient is a 32 y.o.  male with history of recurrent MSSA native tricuspid valve endocarditis in spite of completing several courses of IV antibiotics.  Please see below for further details.  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  Underwent angio vac procedure on 4/26.Completed cefazolin on 5/10-planned weekly dalbavancin at the ID clinic.   5/22>> outpatient blood culture positive for MSSA 5/24-6/8>> admit to [facility] for persistent bacteremia-worsening back pain-discharge to [facility] on IV Ancef. 7/5>> discharged from [facility] on oral cefdinir.  Continued to have intermittent fever postdischarge.   7/24>> admit for-persistent fever/nausea/vomiting/diarrhea-blood cultures positive for MSSA. 9/5>> fever-repeat blood cultures growing Staph aureus supposed to finish IV antibiotic treatment 9/6 9/5 >> PICC line discontinued  Significant studies: 7/25>> CT chest: Multifocal airspace opacities-increasing cavitary nodules 7/25>> MRI thoracic spine: No evidence of infection in thoracic spine.  Significant microbiology data: 7/24>> urine culture: Negative 7/25>> blood culture: 1/2-MSSA. 7/27>> blood culture: No growth 7/29>> blood culture: No growth 9/04>> blood cultures:MRSA 9/07>> blood cultures:neg  Procedures: 8/3>> TEE: 3 vegetations on tricuspid valve-severe TR.  EF 60-65% 9/14>> multiple teeth extraction  Consults: ID CT suregery Dentist   Subjective:  Lying comfortably in bed-no major issues overnight.  He is scheduled for valve replacement surgery on 9/19.  Objective: Vitals: Blood pressure 105/61, pulse (!) 111, temperature 97.6 F (36.4 C), temperature source Oral,  resp. rate 18, height 5\' 10"  (1.778 m), weight 79.4 kg, SpO2 98 %.   Exam: Gen Exam:Alert awake-not in any distress HEENT:atraumatic, normocephalic Chest: B/L clear to auscultation anteriorly CVS:S1S2 regular Abdomen:soft non tender, non distended Extremities:no edema Neurology: Non focal Skin: no rash   Assessment/Plan: Sepsis due to recurrent MSSA bacteremia with native tricuspid valve endocarditis: Failed IV antibiotic treatment-dental surgery performed multiple teeth extraction on 9/14-discussed with Dr. Kipp Brood on 9/15-scheduled for valve replacement surgery this coming Tuesday.  Continue with IV antibiotics.    Normocytic anemia: Due to chronic disease/recurrent endocarditis: No indication for transfusion.  Anxiety: Stable-on Lexapro-Xanax be slowly weaned down.  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  Mild transaminitis: Downtrending-monitor periodically.  Nutrition Status: Nutrition Problem: Inadequate oral intake Etiology: vomiting, nausea Signs/Symptoms: per patient/family report Interventions: Ensure Enlive (each supplement provides 350kcal and 20 grams of protein), MVI, Snacks  BMI: Estimated body mass index is 25.11 kg/m as calculated from the following:   Height as of this encounter: 5\' 10"  (1.778 m).   Weight as of this encounter: 79.4 kg.   Code status:   Code Status: Full Code   DVT Prophylaxis: enoxaparin (LOVENOX) injection 40 mg Start: 04/20/22 1000   Family Communication: None at bedside   Disposition Plan: Status is: Inpatient    Diet: Diet Order             DIET DYS 3 Room service appropriate? Yes; Fluid consistency: Thin  Diet effective now                    MEDICATIONS: Scheduled Meds:  enoxaparin (LOVENOX) injection  40 mg Subcutaneous Q24H   escitalopram  10 mg  Oral Daily   feeding supplement  237 mL Oral BID BM   methadone  135 mg Oral Daily    multivitamin with minerals  1 tablet Oral Daily   nicotine  14 mg Transdermal Daily   traZODone  50 mg Oral QHS   Continuous Infusions:  ceFTAROline (TEFLARO) IV 600 mg (04/21/22 0547)   DAPTOmycin (CUBICIN) 650 mg in sodium chloride 0.9 % IVPB 650 mg (04/20/22 2136)   lactated ringers 10 mL/hr at 04/18/22 1829   sodium chloride     PRN Meds:.acetaminophen, ALPRAZolam, alum & mag hydroxide-simeth, bisacodyl, diclofenac Sodium, docusate sodium, hydrOXYzine, ibuprofen, loperamide, methocarbamol, oxyCODONE, polyethylene glycol, promethazine, sodium chloride, sodium chloride flush   I have personally reviewed following labs and imaging studies  LABORATORY DATA: Recent Labs  Lab 04/15/22 0705 04/16/22 0712 04/17/22 0304  WBC 5.0 7.1 6.0  HGB 9.5* 8.3* 8.5*  HCT 29.7* 25.9* 26.4*  PLT 151 179 178  MCV 84.4 83.8 83.8  MCH 27.0 26.9 27.0  MCHC 32.0 32.0 32.2  RDW 16.6* 16.6* 16.8*  LYMPHSABS 1.7 1.8  --   MONOABS 0.5 0.6  --   EOSABS 0.2 0.2  --   BASOSABS 0.0 0.0  --      Recent Labs  Lab 04/15/22 0705 04/16/22 0712 04/17/22 0304  NA  --   --  137  K  --   --  4.2  CL  --   --  103  CO2  --   --  26  GLUCOSE  --   --  95  BUN  --   --  10  CREATININE  --   --  0.80  CALCIUM  --   --  8.7*  MG 2.3 2.1  --      MICROBIOLOGY: No results found for this or any previous visit (from the past 240 hour(s)).   RADIOLOGY STUDIES/RESULTS: No results found.   LOS: 55 days   Signature  Jeoffrey Massed M.D on 04/21/2022 at 10:19 AM   -  To page go to [email or web address]

## 2022-04-22 DIAGNOSIS — I079 Rheumatic tricuspid valve disease, unspecified: Secondary | ICD-10-CM | POA: Diagnosis not present

## 2022-04-22 LAB — CBC
HCT: 33.9 % — ABNORMAL LOW (ref 39.0–52.0)
Hemoglobin: 10.7 g/dL — ABNORMAL LOW (ref 13.0–17.0)
MCH: 26.4 pg (ref 26.0–34.0)
MCHC: 31.6 g/dL (ref 30.0–36.0)
MCV: 83.7 fL (ref 80.0–100.0)
Platelets: 234 10*3/uL (ref 150–400)
RBC: 4.05 MIL/uL — ABNORMAL LOW (ref 4.22–5.81)
RDW: 16.9 % — ABNORMAL HIGH (ref 11.5–15.5)
WBC: 7 10*3/uL (ref 4.0–10.5)
nRBC: 0 % (ref 0.0–0.2)

## 2022-04-22 LAB — COMPREHENSIVE METABOLIC PANEL
ALT: 33 U/L (ref 0–44)
AST: 33 U/L (ref 15–41)
Albumin: 3.2 g/dL — ABNORMAL LOW (ref 3.5–5.0)
Alkaline Phosphatase: 75 U/L (ref 38–126)
Anion gap: 10 (ref 5–15)
BUN: 19 mg/dL (ref 6–20)
CO2: 29 mmol/L (ref 22–32)
Calcium: 9.9 mg/dL (ref 8.9–10.3)
Chloride: 100 mmol/L (ref 98–111)
Creatinine, Ser: 0.9 mg/dL (ref 0.61–1.24)
GFR, Estimated: >60 mL/min (ref >60)
Glucose, Bld: 98 mg/dL (ref 70–99)
Potassium: 4.8 mmol/L (ref 3.5–5.1)
Sodium: 139 mmol/L (ref 135–145)
Total Bilirubin: 0.5 mg/dL (ref 0.3–1.2)
Total Protein: 7.7 g/dL (ref 6.5–8.1)

## 2022-04-22 LAB — CULTURE, BLOOD (ROUTINE X 2)

## 2022-04-22 LAB — PREPARE RBC (CROSSMATCH)

## 2022-04-22 LAB — PROTIME-INR
INR: 1.1 (ref 0.8–1.2)
Prothrombin Time: 14.1 seconds (ref 11.4–15.2)

## 2022-04-22 MED ORDER — MILRINONE LACTATE IN DEXTROSE 20-5 MG/100ML-% IV SOLN
0.3000 ug/kg/min | INTRAVENOUS | Status: DC
  Filled 2022-04-22: qty 100

## 2022-04-22 MED ORDER — CHLORHEXIDINE GLUCONATE CLOTH 2 % EX PADS
6.0000 | MEDICATED_PAD | Freq: Once | CUTANEOUS | Status: AC
  Administered 2022-04-23: 6 via TOPICAL

## 2022-04-22 MED ORDER — CEFAZOLIN SODIUM-DEXTROSE 2-4 GM/100ML-% IV SOLN
2.0000 g | INTRAVENOUS | Status: DC
  Filled 2022-04-22: qty 100

## 2022-04-22 MED ORDER — CEFAZOLIN SODIUM-DEXTROSE 2-4 GM/100ML-% IV SOLN
2.0000 g | INTRAVENOUS | Status: AC
  Administered 2022-04-23 (×2): 2 g via INTRAVENOUS
  Filled 2022-04-22: qty 100

## 2022-04-22 MED ORDER — DEXMEDETOMIDINE HCL IN NACL 400 MCG/100ML IV SOLN
0.1000 ug/kg/h | INTRAVENOUS | Status: AC
  Administered 2022-04-23: .3 ug/kg/h via INTRAVENOUS
  Filled 2022-04-22: qty 100

## 2022-04-22 MED ORDER — POTASSIUM CHLORIDE 2 MEQ/ML IV SOLN
80.0000 meq | INTRAVENOUS | Status: DC
  Filled 2022-04-22: qty 40

## 2022-04-22 MED ORDER — HEPARIN 30,000 UNITS/1000 ML (OHS) CELLSAVER SOLUTION
Status: DC
  Filled 2022-04-22: qty 1000

## 2022-04-22 MED ORDER — BISACODYL 5 MG PO TBEC
5.0000 mg | DELAYED_RELEASE_TABLET | Freq: Once | ORAL | Status: AC
  Administered 2022-04-22: 5 mg via ORAL
  Filled 2022-04-22: qty 1

## 2022-04-22 MED ORDER — INSULIN REGULAR(HUMAN) IN NACL 100-0.9 UT/100ML-% IV SOLN
INTRAVENOUS | Status: AC
  Administered 2022-04-23: .9 [IU]/h via INTRAVENOUS
  Filled 2022-04-22: qty 100

## 2022-04-22 MED ORDER — TRANEXAMIC ACID (OHS) BOLUS VIA INFUSION
15.0000 mg/kg | INTRAVENOUS | Status: AC
  Administered 2022-04-23: 1095 mg via INTRAVENOUS
  Filled 2022-04-22: qty 1095

## 2022-04-22 MED ORDER — CHLORHEXIDINE GLUCONATE 0.12 % MT SOLN
15.0000 mL | Freq: Once | OROMUCOSAL | Status: AC
  Administered 2022-04-23: 15 mL via OROMUCOSAL

## 2022-04-22 MED ORDER — EPINEPHRINE HCL 5 MG/250ML IV SOLN IN NS
0.0000 ug/min | INTRAVENOUS | Status: DC
  Filled 2022-04-22: qty 250

## 2022-04-22 MED ORDER — PHENYLEPHRINE HCL-NACL 20-0.9 MG/250ML-% IV SOLN
30.0000 ug/min | INTRAVENOUS | Status: AC
  Administered 2022-04-23: 20 ug/min via INTRAVENOUS
  Filled 2022-04-22: qty 250

## 2022-04-22 MED ORDER — NOREPINEPHRINE 4 MG/250ML-% IV SOLN
0.0000 ug/min | INTRAVENOUS | Status: DC
  Filled 2022-04-22: qty 250

## 2022-04-22 MED ORDER — TRANEXAMIC ACID 1000 MG/10ML IV SOLN
1.5000 mg/kg/h | INTRAVENOUS | Status: AC
  Administered 2022-04-23: 1.5 mg/kg/h via INTRAVENOUS
  Filled 2022-04-22: qty 25

## 2022-04-22 MED ORDER — NITROGLYCERIN IN D5W 200-5 MCG/ML-% IV SOLN
2.0000 ug/min | INTRAVENOUS | Status: DC
  Filled 2022-04-22: qty 250

## 2022-04-22 MED ORDER — CHLORHEXIDINE GLUCONATE CLOTH 2 % EX PADS
6.0000 | MEDICATED_PAD | Freq: Once | CUTANEOUS | Status: AC
  Administered 2022-04-22: 6 via TOPICAL

## 2022-04-22 MED ORDER — PLASMA-LYTE A IV SOLN
INTRAVENOUS | Status: DC
  Filled 2022-04-22: qty 2.5

## 2022-04-22 MED ORDER — MANNITOL 20 % IV SOLN
INTRAVENOUS | Status: DC
  Filled 2022-04-22: qty 13

## 2022-04-22 MED ORDER — METOPROLOL TARTRATE 12.5 MG HALF TABLET
12.5000 mg | ORAL_TABLET | Freq: Once | ORAL | Status: AC
  Administered 2022-04-23: 12.5 mg via ORAL
  Filled 2022-04-22: qty 1

## 2022-04-22 MED ORDER — VANCOMYCIN HCL 1250 MG/250ML IV SOLN
1250.0000 mg | INTRAVENOUS | Status: AC
  Administered 2022-04-23: 1500 mg via INTRAVENOUS
  Filled 2022-04-22: qty 250

## 2022-04-22 MED ORDER — GLUTARALDEHYDE 0.625% SOAKING SOLUTION
TOPICAL | Status: DC
  Filled 2022-04-22: qty 50

## 2022-04-22 MED ORDER — TEMAZEPAM 7.5 MG PO CAPS: 15.0000 mg | ORAL_CAPSULE | Freq: Once | ORAL | Status: DC | PRN

## 2022-04-22 MED ORDER — TRANEXAMIC ACID (OHS) PUMP PRIME SOLUTION
2.0000 mg/kg | INTRAVENOUS | Status: DC
  Filled 2022-04-22: qty 1.59

## 2022-04-22 NOTE — Progress Notes (Signed)
[facility]    Date of Admission:  02/25/2022     ID: Reginald Clarke is a 31 y.o. male with  MRSA TV endocarditis/ previously had MSSA TV endocarditis Principal Problem:   Endocarditis of tricuspid valve Active Problems:   MRSA bacteremia   Chronic pain syndrome/methadone abuse    Tobacco abuse   Polysubstance abuse ([name])   Hepatitis C, chronic ([name])   Anxiety   Normocytic anemia   Prolonged QT interval   Sepsis ([name])   Elevated serum creatinine   Hypokalemia   Hypomagnesemia   Hyponatremia   Subacute bacterial endocarditis   MSSA bacteremia   IVDU (intravenous drug user)   Pneumonia of both lower lobes due to methicillin resistant Staphylococcus aureus (MRSA) ([name])   Thrombocytopenia ([name])   Encounter for preoperative dental examination   Caries   Retained tooth root   Chronic apical periodontitis   Chronic periodontitis   Accretions on teeth   Impacted third molar tooth   Teeth missing   Phobia of dental procedure    Subjective: Gums healing from teeth extraction. No fever. No bleeding from gums. Has questions about his upcoming valve surgery  Medications:   enoxaparin (LOVENOX) injection  40 mg Subcutaneous Q24H   [START ON 04/23/2022] epinephrine  0-10 mcg/min Intravenous To OR   escitalopram  10 mg Oral Daily   feeding supplement  237 mL Oral BID BM   [START ON 04/23/2022] glutaraldehyde   Topical To OR   [START ON 04/23/2022] heparin sodium (porcine) 2,500 Units, papaverine 30 mg in electrolyte-A (PLASMALYTE-A PH 7.4) 500 mL irrigation   Irrigation To OR   [START ON 04/23/2022] insulin   Intravenous To OR   [START ON 04/23/2022] [place] Blood Cardioplegia vial (lidocaine/magnesium/mannitol 0.26g-4g-6.4g)   Intracoronary To OR   methadone  135 mg Oral Daily   multivitamin with minerals  1 tablet Oral Daily   nicotine  14 mg Transdermal Daily   [START ON 04/23/2022] phenylephrine  30-200 mcg/min Intravenous To OR   [START ON  04/23/2022] potassium chloride  80 mEq Other To OR   [START ON 04/23/2022] tranexamic acid  15 mg/kg (Ideal) Intravenous To OR   [START ON 04/23/2022] tranexamic acid  2 mg/kg Intracatheter To OR   traZODone  50 mg Oral QHS    Objective: Vital signs in last 24 hours: Temp:  [98 F (36.7 C)-98.4 F (36.9 C)] 98.4 F (36.9 C) (09/18 0725) Pulse Rate:  [96-116] 101 (09/18 0725) Resp:  [16-17] 17 (09/18 0725) BP: (107-127)/(58-75) 127/72 (09/18 0725) SpO2:  [95 %-97 %] 95 % (09/18 0725) Physical Exam  Constitutional: He is oriented to person, place, and time. He appears well-developed and well-nourished. No distress.  HENT:  Mouth/Throat: Oropharynx is clear and moist. No oropharyngeal exudate.  Cardiovascular: Normal rate, regular rhythm and normal heart sounds. +murmur Pulmonary/Chest: Effort normal and breath sounds normal. No respiratory distress. He has no wheezes.  Abdominal: Soft. Bowel sounds are normal. He exhibits no distension. There is no tenderness.  Lymphadenopathy:  He has no cervical adenopathy.  Neurological: He is alert and oriented to person, place, and time.  Skin: Skin is warm and dry. No rash noted. No erythema.  Psychiatric: He has a normal mood and affect. His behavior is normal.    Lab Results Recent Labs    04/22/22 0407  WBC 7.0  HGB 10.7*  HCT 33.9*  NA 139  K 4.8  CL 100  CO2 29  BUN  19  CREATININE 0.90   Liver Panel Recent Labs    04/22/22 0407  PROT 7.7  ALBUMIN 3.2*  AST 33  ALT 33  ALKPHOS 75  BILITOT 0.5    Microbiology: 9/7 blood cx ngtd 9/4 blood cx MRSA Studies/Results: No results found.   Assessment/Plan: MRSA/MSSA TV endocarditis = was placed on dual therapy with daptomycin/ceftaroline to help clear bacteremia as well as see if his MRSA isolate is daptomycin sensitive. We will plan to restart abtx clock once he has valve surgery tomorrow. May only need 7 days more of dual coverage. Awaiting for daptomycin  sensitivities  -would ask for tissue sample sent for cx if he does have valve replacement  Dental extraction = continue with salt water rinse  Chronic hep C without hepatic coma = will address treatment after he completes endocarditis treatment.  [facility] Pager: [phone]  04/22/2022, 2:04 PM

## 2022-04-22 NOTE — Anesthesia Preprocedure Evaluation (Addendum)
Anesthesia Evaluation  Patient identified by MRN, date of birth, ID band Patient awake    Reviewed: Allergy & Precautions, NPO status , Patient's Chart, lab work & pertinent test results  Airway Mallampati: II  TM Distance: >3 FB Neck ROM: Full    Dental  (+) Dental Advisory Given, Missing, Edentulous Upper   Pulmonary former smoker,    Pulmonary exam normal breath sounds clear to auscultation       Cardiovascular Normal cardiovascular exam+ Valvular Problems/Murmurs (TR, TV endocarditis)  Rhythm:Regular Rate:Normal     Neuro/Psych PSYCHIATRIC DISORDERS Anxiety negative neurological ROS     GI/Hepatic GERD  Medicated,(+)     substance abuse (Methadone)  , Hepatitis -, C  Endo/Other  negative endocrine ROS  Renal/GU negative Renal ROS     Musculoskeletal negative musculoskeletal ROS (+) narcotic dependent  Abdominal   Peds  Hematology  (+) Blood dyscrasia, anemia ,   Anesthesia Other Findings   Reproductive/Obstetrics                            Anesthesia Physical Anesthesia Plan  ASA: 4  Anesthesia Plan: General   Post-op Pain Management:    Induction: Intravenous  PONV Risk Score and Plan: 2 and Midazolam and Treatment may vary due to age or medical condition  Airway Management Planned: Oral ETT  Additional Equipment: Arterial line, CVP, Ultrasound Guidance Line Placement and TEE  Intra-op Plan:   Post-operative Plan: Post-operative intubation/ventilation  Informed Consent: I have reviewed the patients History and Physical, chart, labs and discussed the procedure including the risks, benefits and alternatives for the proposed anesthesia with the patient or authorized representative who has indicated his/her understanding and acceptance.     Dental advisory given  Plan Discussed with: CRNA  Anesthesia Plan Comments: (Flo Trac)      Anesthesia Quick Evaluation

## 2022-04-22 NOTE — Progress Notes (Signed)
PROGRESS NOTE        PATIENT DETAILS Name: Reginald Clarke Age: 31 y.o. Sex: male Date of Birth: 06/26/1991 Admit Date: 02/25/2022 Admitting Physician Dorcas Carrow, MD [id number], [name], PA-C  Brief Summary: Patient is a 31 y.o.  male with history of recurrent MSSA native tricuspid valve endocarditis in spite of completing several courses of IV antibiotics.  Please see below for further details.  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  Underwent angio vac procedure on 4/26.Completed cefazolin on 5/10-planned weekly dalbavancin at the ID clinic.   5/22>> outpatient blood culture positive for MSSA 5/24-6/8>> admit to [facility] for persistent bacteremia-worsening back pain-discharge to [facility] on IV Ancef. 7/5>> discharged from [facility] on oral cefdinir.  Continued to have intermittent fever postdischarge.   7/24>> admit for-persistent fever/nausea/vomiting/diarrhea-blood cultures positive for MSSA. 9/5>> fever-repeat blood cultures growing Staph aureus supposed to finish IV antibiotic treatment 9/6 9/5 >> PICC line discontinued  Significant studies: 7/25>> CT chest: Multifocal airspace opacities-increasing cavitary nodules 7/25>> MRI thoracic spine: No evidence of infection in thoracic spine.  Significant microbiology data: 7/24>> urine culture: Negative 7/25>> blood culture: 1/2-MSSA. 7/27>> blood culture: No growth 7/29>> blood culture: No growth 9/04>> blood cultures:MRSA 9/07>> blood cultures:neg  Procedures: 8/3>> TEE: 3 vegetations on tricuspid valve-severe TR.  EF 60-65% 9/14>> multiple teeth extraction  Consults: ID CT suregery Dentist   Subjective:  No major issues overnight.  Objective: Vitals: Blood pressure 127/72, pulse (!) 101, temperature 98.4 F (36.9 C), temperature source Oral, resp. rate 17, height 5\' 10"  (1.778 m), weight 79.4 kg, SpO2 95 %.   Exam: Gen  Exam:Alert awake-not in any distress HEENT:atraumatic, normocephalic Chest: B/L clear to auscultation anteriorly CVS:S1S2 regular Abdomen:soft non tender, non distended Extremities:no edema Neurology: Non focal Skin: no rash   Assessment/Plan: Sepsis due to recurrent MSSA bacteremia with native tricuspid valve endocarditis: Failed IV antibiotic treatment-dental surgery performed multiple teeth extraction on 9/14-discussed with Dr. 01-07-1988 on 9/15-patient is scheduled for valve repair/replacement on 9/19.  Continue IV antibiotics.   Normocytic anemia: Due to chronic disease/recurrent endocarditis: No indication for transfusion.  Anxiety: Stable-on Lexapro-Xanax be slowly weaned down.  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  Mild transaminitis: Downtrending-monitor periodically.  Nutrition Status: Nutrition Problem: Inadequate oral intake Etiology: vomiting, nausea Signs/Symptoms: per patient/family report Interventions: Ensure Enlive (each supplement provides 350kcal and 20 grams of protein), MVI, Snacks  BMI: Estimated body mass index is 25.11 kg/m as calculated from the following:   Height as of this encounter: 5\' 10"  (1.778 m).   Weight as of this encounter: 79.4 kg.   Code status:   Code Status: Full Code   DVT Prophylaxis: enoxaparin (LOVENOX) injection 40 mg Start: 04/20/22 1000   Family Communication: None at bedside   Disposition Plan: Status is: Inpatient    Diet: Diet Order             DIET DYS 3 Room service appropriate? Yes; Fluid consistency: Thin  Diet effective now                    MEDICATIONS: Scheduled Meds:  enoxaparin (LOVENOX) injection  40 mg Subcutaneous Q24H   escitalopram  10 mg Oral Daily   feeding supplement  237 mL Oral BID BM   methadone  135 mg Oral Daily   multivitamin with minerals  1 tablet Oral Daily   nicotine  14 mg Transdermal Daily   traZODone   50 mg Oral QHS   Continuous Infusions:  ceFTAROline (TEFLARO) IV 600 mg (04/22/22 0847)   DAPTOmycin (CUBICIN) 650 mg in sodium chloride 0.9 % IVPB 650 mg (04/21/22 1950)   lactated ringers 10 mL/hr at 04/18/22 1829   sodium chloride     PRN Meds:.acetaminophen, ALPRAZolam, alum & mag hydroxide-simeth, bisacodyl, diclofenac Sodium, docusate sodium, hydrOXYzine, ibuprofen, loperamide, methocarbamol, oxyCODONE, polyethylene glycol, promethazine, sodium chloride, sodium chloride flush   I have personally reviewed following labs and imaging studies  LABORATORY DATA: Recent Labs  Lab 04/16/22 0712 04/17/22 0304 04/22/22 0407  WBC 7.1 6.0 7.0  HGB 8.3* 8.5* 10.7*  HCT 25.9* 26.4* 33.9*  PLT 179 178 234  MCV 83.8 83.8 83.7  MCH 26.9 27.0 26.4  MCHC 32.0 32.2 31.6  RDW 16.6* 16.8* 16.9*  LYMPHSABS 1.8  --   --   MONOABS 0.6  --   --   EOSABS 0.2  --   --   BASOSABS 0.0  --   --      Recent Labs  Lab 04/16/22 0712 04/17/22 0304 04/22/22 0407  NA  --  137 139  K  --  4.2 4.8  CL  --  103 100  CO2  --  26 29  GLUCOSE  --  95 98  BUN  --  10 19  CREATININE  --  0.80 0.90  CALCIUM  --  8.7* 9.9  AST  --   --  33  ALT  --   --  33  ALKPHOS  --   --  75  BILITOT  --   --  0.5  ALBUMIN  --   --  3.2*  MG 2.1  --   --   INR  --   --  1.1     MICROBIOLOGY: No results found for this or any previous visit (from the past 240 hour(s)).   RADIOLOGY STUDIES/RESULTS: No results found.   LOS: 39 days   Signature  Oren Binet M.D on 04/22/2022 at 11:46 AM   -  To page go to [email or web address]

## 2022-04-23 ENCOUNTER — Inpatient Hospital Stay (HOSPITAL_COMMUNITY): Payer: BLUE CROSS/BLUE SHIELD | Admitting: Anesthesiology

## 2022-04-23 ENCOUNTER — Other Ambulatory Visit: Payer: Self-pay

## 2022-04-23 ENCOUNTER — Inpatient Hospital Stay (HOSPITAL_COMMUNITY): Payer: BLUE CROSS/BLUE SHIELD

## 2022-04-23 ENCOUNTER — Inpatient Hospital Stay (HOSPITAL_COMMUNITY)
Admission: EM | Disposition: A | Discharge status: Home / Self Care | Payer: Self-pay | Source: Home / Self Care | Attending: Internal Medicine

## 2022-04-23 DIAGNOSIS — J9601 Acute respiratory failure with hypoxia: Secondary | ICD-10-CM

## 2022-04-23 DIAGNOSIS — R579 Shock, unspecified: Secondary | ICD-10-CM

## 2022-04-23 DIAGNOSIS — I361 Nonrheumatic tricuspid (valve) insufficiency: Secondary | ICD-10-CM | POA: Diagnosis not present

## 2022-04-23 DIAGNOSIS — J9600 Acute respiratory failure, unspecified whether with hypoxia or hypercapnia: Secondary | ICD-10-CM

## 2022-04-23 DIAGNOSIS — I079 Rheumatic tricuspid valve disease, unspecified: Secondary | ICD-10-CM | POA: Diagnosis not present

## 2022-04-23 DIAGNOSIS — F1910 Other psychoactive substance abuse, uncomplicated: Secondary | ICD-10-CM | POA: Diagnosis not present

## 2022-04-23 HISTORY — PX: TRICUSPID VALVE REPLACEMENT: SHX816

## 2022-04-23 HISTORY — PX: TEE WITHOUT CARDIOVERSION: SHX5443

## 2022-04-23 LAB — CBC
HCT: 33.4 % — ABNORMAL LOW (ref 39.0–52.0)
HCT: 26.5 % — ABNORMAL LOW (ref 39.0–52.0)
HCT: 22.8 % — ABNORMAL LOW (ref 39.0–52.0)
Hemoglobin: 10.4 g/dL — ABNORMAL LOW (ref 13.0–17.0)
Hemoglobin: 8.6 g/dL — ABNORMAL LOW (ref 13.0–17.0)
Hemoglobin: 7.7 g/dL — ABNORMAL LOW (ref 13.0–17.0)
MCH: 26.1 pg (ref 26.0–34.0)
MCH: 27.7 pg (ref 26.0–34.0)
MCH: 28 pg (ref 26.0–34.0)
MCHC: 31.1 g/dL (ref 30.0–36.0)
MCHC: 32.5 g/dL (ref 30.0–36.0)
MCHC: 33.8 g/dL (ref 30.0–36.0)
MCV: 83.9 fL (ref 80.0–100.0)
MCV: 85.5 fL (ref 80.0–100.0)
MCV: 82.9 fL (ref 80.0–100.0)
Platelets: 185 10*3/uL (ref 150–400)
Platelets: 179 10*3/uL (ref 150–400)
Platelets: 103 10*3/uL — ABNORMAL LOW (ref 150–400)
RBC: 3.98 MIL/uL — ABNORMAL LOW (ref 4.22–5.81)
RBC: 3.1 MIL/uL — ABNORMAL LOW (ref 4.22–5.81)
RBC: 2.75 MIL/uL — ABNORMAL LOW (ref 4.22–5.81)
RDW: 16.8 % — ABNORMAL HIGH (ref 11.5–15.5)
RDW: 15.9 % — ABNORMAL HIGH (ref 11.5–15.5)
RDW: 15.3 % (ref 11.5–15.5)
WBC: 8.6 10*3/uL (ref 4.0–10.5)
WBC: 38 10*3/uL — ABNORMAL HIGH (ref 4.0–10.5)
WBC: 12.2 10*3/uL — ABNORMAL HIGH (ref 4.0–10.5)
nRBC: 0 % (ref 0.0–0.2)
nRBC: 0 % (ref 0.0–0.2)
nRBC: 0 % (ref 0.0–0.2)

## 2022-04-23 LAB — PLATELET COUNT: Platelets: 105 10*3/uL — ABNORMAL LOW (ref 150–400)

## 2022-04-23 LAB — POCT I-STAT 7, (LYTES, BLD GAS, ICA,H+H)
Acid-Base Excess: 2 mmol/L (ref 0.0–2.0)
Acid-Base Excess: 2 mmol/L (ref 0.0–2.0)
Acid-Base Excess: 4 mmol/L — ABNORMAL HIGH (ref 0.0–2.0)
Acid-Base Excess: 0 mmol/L (ref 0.0–2.0)
Acid-base deficit: 1 mmol/L (ref 0.0–2.0)
Acid-base deficit: 1 mmol/L (ref 0.0–2.0)
Acid-base deficit: 6 mmol/L — ABNORMAL HIGH (ref 0.0–2.0)
Acid-base deficit: 5 mmol/L — ABNORMAL HIGH (ref 0.0–2.0)
Acid-base deficit: 4 mmol/L — ABNORMAL HIGH (ref 0.0–2.0)
Acid-base deficit: 5 mmol/L — ABNORMAL HIGH (ref 0.0–2.0)
Bicarbonate: 27.9 mmol/L (ref 20.0–28.0)
Bicarbonate: 27 mmol/L (ref 20.0–28.0)
Bicarbonate: 29.6 mmol/L — ABNORMAL HIGH (ref 20.0–28.0)
Bicarbonate: 24.8 mmol/L (ref 20.0–28.0)
Bicarbonate: 25.1 mmol/L (ref 20.0–28.0)
Bicarbonate: 25.4 mmol/L (ref 20.0–28.0)
Bicarbonate: 21.6 mmol/L (ref 20.0–28.0)
Bicarbonate: 21.7 mmol/L (ref 20.0–28.0)
Bicarbonate: 20.5 mmol/L (ref 20.0–28.0)
Bicarbonate: 19.3 mmol/L — ABNORMAL LOW (ref 20.0–28.0)
Calcium, Ion: 1.27 mmol/L (ref 1.15–1.40)
Calcium, Ion: 1.07 mmol/L — ABNORMAL LOW (ref 1.15–1.40)
Calcium, Ion: 1.12 mmol/L — ABNORMAL LOW (ref 1.15–1.40)
Calcium, Ion: 0.94 mmol/L — ABNORMAL LOW (ref 1.15–1.40)
Calcium, Ion: 1.06 mmol/L — ABNORMAL LOW (ref 1.15–1.40)
Calcium, Ion: 1.28 mmol/L (ref 1.15–1.40)
Calcium, Ion: 1.27 mmol/L (ref 1.15–1.40)
Calcium, Ion: 1.17 mmol/L (ref 1.15–1.40)
Calcium, Ion: 1.12 mmol/L — ABNORMAL LOW (ref 1.15–1.40)
Calcium, Ion: 1.09 mmol/L — ABNORMAL LOW (ref 1.15–1.40)
HCT: 30 % — ABNORMAL LOW (ref 39.0–52.0)
HCT: 20 % — ABNORMAL LOW (ref 39.0–52.0)
HCT: 23 % — ABNORMAL LOW (ref 39.0–52.0)
HCT: 24 % — ABNORMAL LOW (ref 39.0–52.0)
HCT: 25 % — ABNORMAL LOW (ref 39.0–52.0)
HCT: 24 % — ABNORMAL LOW (ref 39.0–52.0)
HCT: 27 % — ABNORMAL LOW (ref 39.0–52.0)
HCT: 18 % — ABNORMAL LOW (ref 39.0–52.0)
HCT: 19 % — ABNORMAL LOW (ref 39.0–52.0)
HCT: 22 % — ABNORMAL LOW (ref 39.0–52.0)
Hemoglobin: 10.2 g/dL — ABNORMAL LOW (ref 13.0–17.0)
Hemoglobin: 6.8 g/dL — CL (ref 13.0–17.0)
Hemoglobin: 7.8 g/dL — ABNORMAL LOW (ref 13.0–17.0)
Hemoglobin: 8.2 g/dL — ABNORMAL LOW (ref 13.0–17.0)
Hemoglobin: 8.5 g/dL — ABNORMAL LOW (ref 13.0–17.0)
Hemoglobin: 8.2 g/dL — ABNORMAL LOW (ref 13.0–17.0)
Hemoglobin: 9.2 g/dL — ABNORMAL LOW (ref 13.0–17.0)
Hemoglobin: 6.1 g/dL — CL (ref 13.0–17.0)
Hemoglobin: 6.5 g/dL — CL (ref 13.0–17.0)
Hemoglobin: 7.5 g/dL — ABNORMAL LOW (ref 13.0–17.0)
O2 Saturation: 100 %
O2 Saturation: 100 %
O2 Saturation: 100 %
O2 Saturation: 100 %
O2 Saturation: 100 %
O2 Saturation: 100 %
O2 Saturation: 98 %
O2 Saturation: 100 %
O2 Saturation: 100 %
O2 Saturation: 100 %
Patient temperature: 36.3
Patient temperature: 36.6
Patient temperature: 36.8
Patient temperature: 37.3
Potassium: 4.1 mmol/L (ref 3.5–5.1)
Potassium: 4.2 mmol/L (ref 3.5–5.1)
Potassium: 5.1 mmol/L (ref 3.5–5.1)
Potassium: 5.2 mmol/L — ABNORMAL HIGH (ref 3.5–5.1)
Potassium: 5.4 mmol/L — ABNORMAL HIGH (ref 3.5–5.1)
Potassium: 4.9 mmol/L (ref 3.5–5.1)
Potassium: 4.6 mmol/L (ref 3.5–5.1)
Potassium: 5 mmol/L (ref 3.5–5.1)
Potassium: 6.2 mmol/L — ABNORMAL HIGH (ref 3.5–5.1)
Potassium: 5.3 mmol/L — ABNORMAL HIGH (ref 3.5–5.1)
Sodium: 136 mmol/L (ref 135–145)
Sodium: 136 mmol/L (ref 135–145)
Sodium: 138 mmol/L (ref 135–145)
Sodium: 135 mmol/L (ref 135–145)
Sodium: 136 mmol/L (ref 135–145)
Sodium: 137 mmol/L (ref 135–145)
Sodium: 140 mmol/L (ref 135–145)
Sodium: 140 mmol/L (ref 135–145)
Sodium: 138 mmol/L (ref 135–145)
Sodium: 138 mmol/L (ref 135–145)
TCO2: 29 mmol/L (ref 22–32)
TCO2: 28 mmol/L (ref 22–32)
TCO2: 31 mmol/L (ref 22–32)
TCO2: 26 mmol/L (ref 22–32)
TCO2: 26 mmol/L (ref 22–32)
TCO2: 27 mmol/L (ref 22–32)
TCO2: 23 mmol/L (ref 22–32)
TCO2: 23 mmol/L (ref 22–32)
TCO2: 22 mmol/L (ref 22–32)
TCO2: 20 mmol/L — ABNORMAL LOW (ref 22–32)
pCO2 arterial: 50 mmHg — ABNORMAL HIGH (ref 32–48)
pCO2 arterial: 46.2 mmHg (ref 32–48)
pCO2 arterial: 47.9 mmHg (ref 32–48)
pCO2 arterial: 44 mmHg (ref 32–48)
pCO2 arterial: 46 mmHg (ref 32–48)
pCO2 arterial: 53.6 mmHg — ABNORMAL HIGH (ref 32–48)
pCO2 arterial: 53.8 mmHg — ABNORMAL HIGH (ref 32–48)
pCO2 arterial: 45.4 mmHg (ref 32–48)
pCO2 arterial: 32.4 mmHg (ref 32–48)
pCO2 arterial: 33.1 mmHg (ref 32–48)
pH, Arterial: 7.354 (ref 7.35–7.45)
pH, Arterial: 7.375 (ref 7.35–7.45)
pH, Arterial: 7.4 (ref 7.35–7.45)
pH, Arterial: 7.34 — ABNORMAL LOW (ref 7.35–7.45)
pH, Arterial: 7.365 (ref 7.35–7.45)
pH, Arterial: 7.284 — ABNORMAL LOW (ref 7.35–7.45)
pH, Arterial: 7.207 — ABNORMAL LOW (ref 7.35–7.45)
pH, Arterial: 7.286 — ABNORMAL LOW (ref 7.35–7.45)
pH, Arterial: 7.409 (ref 7.35–7.45)
pH, Arterial: 7.376 (ref 7.35–7.45)
pO2, Arterial: 398 mmHg — ABNORMAL HIGH (ref 83–108)
pO2, Arterial: 360 mmHg — ABNORMAL HIGH (ref 83–108)
pO2, Arterial: 484 mmHg — ABNORMAL HIGH (ref 83–108)
pO2, Arterial: 394 mmHg — ABNORMAL HIGH (ref 83–108)
pO2, Arterial: 407 mmHg — ABNORMAL HIGH (ref 83–108)
pO2, Arterial: 484 mmHg — ABNORMAL HIGH (ref 83–108)
pO2, Arterial: 130 mmHg — ABNORMAL HIGH (ref 83–108)
pO2, Arterial: 220 mmHg — ABNORMAL HIGH (ref 83–108)
pO2, Arterial: 243 mmHg — ABNORMAL HIGH (ref 83–108)
pO2, Arterial: 181 mmHg — ABNORMAL HIGH (ref 83–108)

## 2022-04-23 LAB — POCT I-STAT, CHEM 8
BUN: 18 mg/dL (ref 6–20)
BUN: 17 mg/dL (ref 6–20)
BUN: 19 mg/dL (ref 6–20)
BUN: 18 mg/dL (ref 6–20)
BUN: 18 mg/dL (ref 6–20)
BUN: 16 mg/dL (ref 6–20)
Calcium, Ion: 1.28 mmol/L (ref 1.15–1.40)
Calcium, Ion: 1.25 mmol/L (ref 1.15–1.40)
Calcium, Ion: 1.12 mmol/L — ABNORMAL LOW (ref 1.15–1.40)
Calcium, Ion: 0.94 mmol/L — ABNORMAL LOW (ref 1.15–1.40)
Calcium, Ion: 1.07 mmol/L — ABNORMAL LOW (ref 1.15–1.40)
Calcium, Ion: 1.29 mmol/L (ref 1.15–1.40)
Chloride: 99 mmol/L (ref 98–111)
Chloride: 101 mmol/L (ref 98–111)
Chloride: 101 mmol/L (ref 98–111)
Chloride: 102 mmol/L (ref 98–111)
Chloride: 102 mmol/L (ref 98–111)
Chloride: 105 mmol/L (ref 98–111)
Creatinine, Ser: 0.6 mg/dL — ABNORMAL LOW (ref 0.61–1.24)
Creatinine, Ser: 0.5 mg/dL — ABNORMAL LOW (ref 0.61–1.24)
Creatinine, Ser: 0.7 mg/dL (ref 0.61–1.24)
Creatinine, Ser: 0.6 mg/dL — ABNORMAL LOW (ref 0.61–1.24)
Creatinine, Ser: 0.7 mg/dL (ref 0.61–1.24)
Creatinine, Ser: 0.5 mg/dL — ABNORMAL LOW (ref 0.61–1.24)
Glucose, Bld: 111 mg/dL — ABNORMAL HIGH (ref 70–99)
Glucose, Bld: 133 mg/dL — ABNORMAL HIGH (ref 70–99)
Glucose, Bld: 201 mg/dL — ABNORMAL HIGH (ref 70–99)
Glucose, Bld: 211 mg/dL — ABNORMAL HIGH (ref 70–99)
Glucose, Bld: 232 mg/dL — ABNORMAL HIGH (ref 70–99)
Glucose, Bld: 189 mg/dL — ABNORMAL HIGH (ref 70–99)
HCT: 29 % — ABNORMAL LOW (ref 39.0–52.0)
HCT: 31 % — ABNORMAL LOW (ref 39.0–52.0)
HCT: 20 % — ABNORMAL LOW (ref 39.0–52.0)
HCT: 23 % — ABNORMAL LOW (ref 39.0–52.0)
HCT: 25 % — ABNORMAL LOW (ref 39.0–52.0)
HCT: 23 % — ABNORMAL LOW (ref 39.0–52.0)
Hemoglobin: 9.9 g/dL — ABNORMAL LOW (ref 13.0–17.0)
Hemoglobin: 10.5 g/dL — ABNORMAL LOW (ref 13.0–17.0)
Hemoglobin: 6.8 g/dL — CL (ref 13.0–17.0)
Hemoglobin: 7.8 g/dL — ABNORMAL LOW (ref 13.0–17.0)
Hemoglobin: 8.5 g/dL — ABNORMAL LOW (ref 13.0–17.0)
Hemoglobin: 7.8 g/dL — ABNORMAL LOW (ref 13.0–17.0)
Potassium: 4.1 mmol/L (ref 3.5–5.1)
Potassium: 4.2 mmol/L (ref 3.5–5.1)
Potassium: 5.5 mmol/L — ABNORMAL HIGH (ref 3.5–5.1)
Potassium: 5.2 mmol/L — ABNORMAL HIGH (ref 3.5–5.1)
Potassium: 5.4 mmol/L — ABNORMAL HIGH (ref 3.5–5.1)
Potassium: 4.9 mmol/L (ref 3.5–5.1)
Sodium: 137 mmol/L (ref 135–145)
Sodium: 136 mmol/L (ref 135–145)
Sodium: 134 mmol/L — ABNORMAL LOW (ref 135–145)
Sodium: 135 mmol/L (ref 135–145)
Sodium: 137 mmol/L (ref 135–145)
Sodium: 138 mmol/L (ref 135–145)
TCO2: 28 mmol/L (ref 22–32)
TCO2: 25 mmol/L (ref 22–32)
TCO2: 28 mmol/L (ref 22–32)
TCO2: 26 mmol/L (ref 22–32)
TCO2: 26 mmol/L (ref 22–32)
TCO2: 25 mmol/L (ref 22–32)

## 2022-04-23 LAB — POCT I-STAT EG7
Acid-Base Excess: 3 mmol/L — ABNORMAL HIGH (ref 0.0–2.0)
Bicarbonate: 28.6 mmol/L — ABNORMAL HIGH (ref 20.0–28.0)
Calcium, Ion: 1.12 mmol/L — ABNORMAL LOW (ref 1.15–1.40)
HCT: 20 % — ABNORMAL LOW (ref 39.0–52.0)
Hemoglobin: 6.8 g/dL — CL (ref 13.0–17.0)
O2 Saturation: 74 %
Potassium: 5.1 mmol/L (ref 3.5–5.1)
Sodium: 137 mmol/L (ref 135–145)
TCO2: 30 mmol/L (ref 22–32)
pCO2, Ven: 53.8 mmHg (ref 44–60)
pH, Ven: 7.333 (ref 7.25–7.43)
pO2, Ven: 43 mmHg (ref 32–45)

## 2022-04-23 LAB — PROTIME-INR
INR: 1.6 — ABNORMAL HIGH (ref 0.8–1.2)
Prothrombin Time: 18.8 seconds — ABNORMAL HIGH (ref 11.4–15.2)

## 2022-04-23 LAB — BASIC METABOLIC PANEL
Anion gap: 10 (ref 5–15)
Anion gap: 8 (ref 5–15)
Anion gap: 3 — ABNORMAL LOW (ref 5–15)
BUN: 19 mg/dL (ref 6–20)
BUN: 17 mg/dL (ref 6–20)
BUN: 16 mg/dL (ref 6–20)
CO2: 26 mmol/L (ref 22–32)
CO2: 24 mmol/L (ref 22–32)
CO2: 21 mmol/L — ABNORMAL LOW (ref 22–32)
Calcium: 9.9 mg/dL (ref 8.9–10.3)
Calcium: 8.6 mg/dL — ABNORMAL LOW (ref 8.9–10.3)
Calcium: 7.1 mg/dL — ABNORMAL LOW (ref 8.9–10.3)
Chloride: 98 mmol/L (ref 98–111)
Chloride: 106 mmol/L (ref 98–111)
Chloride: 111 mmol/L (ref 98–111)
Creatinine, Ser: 0.87 mg/dL (ref 0.61–1.24)
Creatinine, Ser: 0.71 mg/dL (ref 0.61–1.24)
Creatinine, Ser: 0.64 mg/dL (ref 0.61–1.24)
GFR, Estimated: >60 mL/min (ref >60)
GFR, Estimated: >60 mL/min (ref >60)
GFR, Estimated: >60 mL/min (ref >60)
Glucose, Bld: 119 mg/dL — ABNORMAL HIGH (ref 70–99)
Glucose, Bld: 156 mg/dL — ABNORMAL HIGH (ref 70–99)
Glucose, Bld: 152 mg/dL — ABNORMAL HIGH (ref 70–99)
Potassium: 4.3 mmol/L (ref 3.5–5.1)
Potassium: 5 mmol/L (ref 3.5–5.1)
Potassium: 4.8 mmol/L (ref 3.5–5.1)
Sodium: 134 mmol/L — ABNORMAL LOW (ref 135–145)
Sodium: 138 mmol/L (ref 135–145)
Sodium: 135 mmol/L (ref 135–145)

## 2022-04-23 LAB — HEMOGLOBIN AND HEMATOCRIT, BLOOD
HCT: 22.6 % — ABNORMAL LOW (ref 39.0–52.0)
HCT: 20.7 % — ABNORMAL LOW (ref 39.0–52.0)
Hemoglobin: 7.4 g/dL — ABNORMAL LOW (ref 13.0–17.0)
Hemoglobin: 6.9 g/dL — CL (ref 13.0–17.0)

## 2022-04-23 LAB — PREPARE RBC (CROSSMATCH)

## 2022-04-23 LAB — GLUCOSE, CAPILLARY
Glucose-Capillary: 134 mg/dL — ABNORMAL HIGH (ref 70–99)
Glucose-Capillary: 143 mg/dL — ABNORMAL HIGH (ref 70–99)
Glucose-Capillary: 143 mg/dL — ABNORMAL HIGH (ref 70–99)
Glucose-Capillary: 128 mg/dL — ABNORMAL HIGH (ref 70–99)
Glucose-Capillary: 137 mg/dL — ABNORMAL HIGH (ref 70–99)
Glucose-Capillary: 139 mg/dL — ABNORMAL HIGH (ref 70–99)
Glucose-Capillary: 138 mg/dL — ABNORMAL HIGH (ref 70–99)
Glucose-Capillary: 104 mg/dL — ABNORMAL HIGH (ref 70–99)

## 2022-04-23 LAB — APTT: aPTT: 43 seconds — ABNORMAL HIGH (ref 24–36)

## 2022-04-23 LAB — FIBRINOGEN: Fibrinogen: 282 mg/dL (ref 210–475)

## 2022-04-23 LAB — MAGNESIUM: Magnesium: 2.5 mg/dL — ABNORMAL HIGH (ref 1.7–2.4)

## 2022-04-23 SURGERY — TRICUSPID VALVE REPAIR
Anesthesia: General | Site: Esophagus

## 2022-04-23 MED ORDER — CHLORHEXIDINE GLUCONATE CLOTH 2 % EX PADS
6.0000 | MEDICATED_PAD | Freq: Every day | CUTANEOUS | Status: DC
  Administered 2022-04-24 – 2022-04-30 (×5): 6 via TOPICAL

## 2022-04-23 MED ORDER — ROCURONIUM BROMIDE 10 MG/ML (PF) SYRINGE
PREFILLED_SYRINGE | INTRAVENOUS | Status: AC
  Filled 2022-04-23: qty 10

## 2022-04-23 MED ORDER — METOPROLOL TARTRATE 12.5 MG HALF TABLET
12.5000 mg | ORAL_TABLET | Freq: Two times a day (BID) | ORAL | Status: DC
  Administered 2022-04-24 – 2022-04-28 (×9): 12.5 mg via ORAL
  Filled 2022-04-23 (×9): qty 1

## 2022-04-23 MED ORDER — DEXMEDETOMIDINE HCL IN NACL 400 MCG/100ML IV SOLN
0.0000 ug/kg/h | INTRAVENOUS | Status: DC
  Filled 2022-04-23: qty 100

## 2022-04-23 MED ORDER — ALUM & MAG HYDROXIDE-SIMETH 200-200-20 MG/5ML PO SUSP
15.0000 mL | Freq: Four times a day (QID) | ORAL | Status: DC | PRN
  Administered 2022-05-18: 15 mL
  Filled 2022-04-23: qty 30

## 2022-04-23 MED ORDER — MIDAZOLAM HCL 2 MG/2ML IJ SOLN
INTRAMUSCULAR | Status: AC
  Administered 2022-04-23: 2 mg
  Filled 2022-04-23: qty 2

## 2022-04-23 MED ORDER — NOREPINEPHRINE 4 MG/250ML-% IV SOLN
0.0000 ug/min | INTRAVENOUS | Status: DC
  Administered 2022-04-23: 14 ug/min via INTRAVENOUS
  Administered 2022-04-23: 10 ug/min via INTRAVENOUS
  Administered 2022-04-23: 11 ug/min via INTRAVENOUS
  Administered 2022-04-24: 8 ug/min via INTRAVENOUS
  Filled 2022-04-23 (×3): qty 250

## 2022-04-23 MED ORDER — HEPARIN SODIUM (PORCINE) 1000 UNIT/ML IJ SOLN
INTRAMUSCULAR | Status: DC | PRN
  Administered 2022-04-23: 30000 [IU] via INTRAVENOUS

## 2022-04-23 MED ORDER — FAMOTIDINE IN NACL 20-0.9 MG/50ML-% IV SOLN
20.0000 mg | Freq: Two times a day (BID) | INTRAVENOUS | Status: AC
  Administered 2022-04-23: 20 mg via INTRAVENOUS
  Filled 2022-04-23 (×2): qty 50

## 2022-04-23 MED ORDER — PLASMA-LYTE A IV SOLN: INTRAVENOUS | Status: DC | PRN

## 2022-04-23 MED ORDER — SODIUM CHLORIDE (PF) 0.9 % IJ SOLN: OROMUCOSAL | Status: DC | PRN

## 2022-04-23 MED ORDER — LACTATED RINGERS IV SOLN: INTRAVENOUS | Status: DC

## 2022-04-23 MED ORDER — ACETAMINOPHEN 160 MG/5ML PO SOLN
650.0000 mg | Freq: Once | ORAL | Status: DC
  Filled 2022-04-23: qty 20.3

## 2022-04-23 MED ORDER — ACETAMINOPHEN 160 MG/5ML PO SOLN
1000.0000 mg | Freq: Four times a day (QID) | ORAL | Status: AC
  Administered 2022-04-23: 1000 mg

## 2022-04-23 MED ORDER — DOCUSATE SODIUM 100 MG PO CAPS: 200.0000 mg | ORAL_CAPSULE | Freq: Every day | ORAL | Status: DC

## 2022-04-23 MED ORDER — PROPOFOL 10 MG/ML IV BOLUS
INTRAVENOUS | Status: AC
  Filled 2022-04-23: qty 20

## 2022-04-23 MED ORDER — GABAPENTIN 250 MG/5ML PO SOLN
300.0000 mg | Freq: Three times a day (TID) | ORAL | Status: DC
  Administered 2022-04-23: 300 mg
  Filled 2022-04-23 (×5): qty 6

## 2022-04-23 MED ORDER — ACETAMINOPHEN 500 MG PO TABS
1000.0000 mg | ORAL_TABLET | Freq: Four times a day (QID) | ORAL | Status: AC
  Administered 2022-04-24 – 2022-04-28 (×13): 1000 mg via ORAL
  Filled 2022-04-23 (×14): qty 2

## 2022-04-23 MED ORDER — VANCOMYCIN HCL IN DEXTROSE 1-5 GM/200ML-% IV SOLN
1000.0000 mg | Freq: Once | INTRAVENOUS | Status: AC
  Administered 2022-04-23: 1000 mg via INTRAVENOUS
  Filled 2022-04-23: qty 200

## 2022-04-23 MED ORDER — VANCOMYCIN HCL 1000 MG IV SOLR
INTRAVENOUS | Status: AC
  Filled 2022-04-23: qty 20

## 2022-04-23 MED ORDER — ENSURE ENLIVE PO LIQD: 237.0000 mL | Freq: Two times a day (BID) | ORAL | Status: DC

## 2022-04-23 MED ORDER — SODIUM CHLORIDE 0.9 % IV SOLN: 250.0000 mL | INTRAVENOUS | Status: DC

## 2022-04-23 MED ORDER — NITROGLYCERIN IN D5W 200-5 MCG/ML-% IV SOLN: 0.0000 ug/min | INTRAVENOUS | Status: DC

## 2022-04-23 MED ORDER — POTASSIUM CHLORIDE 10 MEQ/50ML IV SOLN: 10.0000 meq | INTRAVENOUS | Status: AC

## 2022-04-23 MED ORDER — LACTATED RINGERS IV SOLN: INTRAVENOUS | Status: DC | PRN

## 2022-04-23 MED ORDER — MIDAZOLAM HCL 2 MG/2ML IJ SOLN
2.0000 mg | INTRAMUSCULAR | Status: DC | PRN
  Administered 2022-04-23 (×2): 2 mg via INTRAVENOUS
  Filled 2022-04-23 (×2): qty 2

## 2022-04-23 MED ORDER — METHADONE HCL 10 MG PO TABS
135.0000 mg | ORAL_TABLET | Freq: Every day | ORAL | Status: DC
  Administered 2022-04-23: 135 mg

## 2022-04-23 MED ORDER — HEPARIN SODIUM (PORCINE) 1000 UNIT/ML IJ SOLN
INTRAMUSCULAR | Status: AC
  Filled 2022-04-23: qty 1

## 2022-04-23 MED ORDER — METOPROLOL TARTRATE 5 MG/5ML IV SOLN
2.5000 mg | INTRAVENOUS | Status: DC | PRN
  Filled 2022-04-23: qty 5

## 2022-04-23 MED ORDER — ALBUMIN HUMAN 5 % IV SOLN: INTRAVENOUS | Status: DC | PRN

## 2022-04-23 MED ORDER — LACTATED RINGERS IV SOLN: 500.0000 mL | Freq: Once | INTRAVENOUS | Status: DC | PRN

## 2022-04-23 MED ORDER — FENTANYL CITRATE (PF) 250 MCG/5ML IJ SOLN
INTRAMUSCULAR | Status: AC
  Filled 2022-04-23: qty 5

## 2022-04-23 MED ORDER — SODIUM CHLORIDE 0.45 % IV SOLN: INTRAVENOUS | Status: DC | PRN

## 2022-04-23 MED ORDER — SODIUM CHLORIDE 0.9 % IV SOLN: INTRAVENOUS | Status: DC | PRN

## 2022-04-23 MED ORDER — ROCURONIUM BROMIDE 10 MG/ML (PF) SYRINGE
PREFILLED_SYRINGE | INTRAVENOUS | Status: DC | PRN
  Administered 2022-04-23 (×5): 50 mg via INTRAVENOUS
  Administered 2022-04-23: 100 mg via INTRAVENOUS

## 2022-04-23 MED ORDER — MAGNESIUM SULFATE 4 GM/100ML IV SOLN
4.0000 g | Freq: Once | INTRAVENOUS | Status: AC
  Administered 2022-04-23: 4 g via INTRAVENOUS
  Filled 2022-04-23: qty 100

## 2022-04-23 MED ORDER — MORPHINE SULFATE (PF) 2 MG/ML IV SOLN
1.0000 mg | INTRAVENOUS | Status: DC | PRN
  Administered 2022-04-23 (×4): 4 mg via INTRAVENOUS
  Administered 2022-04-23: 2 mg via INTRAVENOUS
  Administered 2022-04-23 – 2022-04-24 (×11): 4 mg via INTRAVENOUS
  Administered 2022-04-24: 2 mg via INTRAVENOUS
  Administered 2022-04-24 (×2): 4 mg via INTRAVENOUS
  Administered 2022-04-25 (×3): 2 mg via INTRAVENOUS
  Administered 2022-04-25: 4 mg via INTRAVENOUS
  Administered 2022-04-25: 2 mg via INTRAVENOUS
  Administered 2022-04-25: 4 mg via INTRAVENOUS
  Administered 2022-04-26 – 2022-04-27 (×3): 2 mg via INTRAVENOUS
  Filled 2022-04-23: qty 1
  Filled 2022-04-23 (×2): qty 2
  Filled 2022-04-23: qty 1
  Filled 2022-04-23: qty 2
  Filled 2022-04-23: qty 1
  Filled 2022-04-23 (×4): qty 2
  Filled 2022-04-23: qty 1
  Filled 2022-04-23 (×9): qty 2
  Filled 2022-04-23: qty 1
  Filled 2022-04-23 (×3): qty 2
  Filled 2022-04-23 (×4): qty 1

## 2022-04-23 MED ORDER — ASPIRIN 81 MG PO CHEW
324.0000 mg | CHEWABLE_TABLET | Freq: Every day | ORAL | Status: DC
  Administered 2022-05-01: 324 mg
  Filled 2022-04-23: qty 4

## 2022-04-23 MED ORDER — ADULT MULTIVITAMIN W/MINERALS CH
1.0000 | ORAL_TABLET | Freq: Every day | ORAL | Status: DC
  Administered 2022-04-24 – 2022-06-04 (×42): 1 via ORAL
  Filled 2022-04-23 (×43): qty 1

## 2022-04-23 MED ORDER — SODIUM CHLORIDE 0.9% IV SOLUTION: Freq: Once | INTRAVENOUS | Status: DC

## 2022-04-23 MED ORDER — PROTAMINE SULFATE 10 MG/ML IV SOLN
INTRAVENOUS | Status: DC | PRN
  Administered 2022-04-23: 300 mg via INTRAVENOUS

## 2022-04-23 MED ORDER — PHENYLEPHRINE HCL-NACL 20-0.9 MG/250ML-% IV SOLN
0.0000 ug/min | INTRAVENOUS | Status: DC
  Administered 2022-04-23: 60 ug/min via INTRAVENOUS
  Filled 2022-04-23: qty 250

## 2022-04-23 MED ORDER — INSULIN REGULAR(HUMAN) IN NACL 100-0.9 UT/100ML-% IV SOLN: INTRAVENOUS | Status: DC

## 2022-04-23 MED ORDER — FENTANYL CITRATE PF 50 MCG/ML IJ SOSY: 50.0000 ug | PREFILLED_SYRINGE | INTRAMUSCULAR | Status: DC | PRN

## 2022-04-23 MED ORDER — DEXTROSE 50 % IV SOLN: 0.0000 mL | INTRAVENOUS | Status: DC | PRN

## 2022-04-23 MED ORDER — MORPHINE SULFATE (PF) 4 MG/ML IV SOLN
INTRAVENOUS | Status: AC
  Administered 2022-04-23: 4 mg
  Filled 2022-04-23: qty 1

## 2022-04-23 MED ORDER — VASOPRESSIN 20 UNITS/100 ML INFUSION FOR SHOCK
0.0000 [IU]/min | INTRAVENOUS | Status: DC
  Administered 2022-04-23 (×2): 0.03 [IU]/min via INTRAVENOUS
  Filled 2022-04-23 (×2): qty 100

## 2022-04-23 MED ORDER — PROTAMINE SULFATE 10 MG/ML IV SOLN
INTRAVENOUS | Status: AC
  Filled 2022-04-23: qty 5

## 2022-04-23 MED ORDER — ACETAMINOPHEN 650 MG RE SUPP: 650.0000 mg | Freq: Once | RECTAL | Status: DC

## 2022-04-23 MED ORDER — DOCUSATE SODIUM 50 MG/5ML PO LIQD: 200.0000 mg | Freq: Every day | ORAL | Status: DC

## 2022-04-23 MED ORDER — PANTOPRAZOLE 2 MG/ML SUSPENSION: 40.0000 mg | Freq: Every day | ORAL | Status: DC

## 2022-04-23 MED ORDER — ALBUMIN HUMAN 5 % IV SOLN
250.0000 mL | INTRAVENOUS | Status: AC | PRN
  Administered 2022-04-23 (×3): 12.5 g via INTRAVENOUS
  Filled 2022-04-23 (×2): qty 250

## 2022-04-23 MED ORDER — KETOROLAC TROMETHAMINE 15 MG/ML IJ SOLN
15.0000 mg | Freq: Three times a day (TID) | INTRAMUSCULAR | Status: DC | PRN
  Administered 2022-04-23 – 2022-04-28 (×12): 15 mg via INTRAVENOUS
  Filled 2022-04-23 (×14): qty 1

## 2022-04-23 MED ORDER — PHENYLEPHRINE 80 MCG/ML (10ML) SYRINGE FOR IV PUSH (FOR BLOOD PRESSURE SUPPORT)
PREFILLED_SYRINGE | INTRAVENOUS | Status: AC
  Filled 2022-04-23: qty 10

## 2022-04-23 MED ORDER — SODIUM CHLORIDE 0.9% FLUSH
3.0000 mL | Freq: Two times a day (BID) | INTRAVENOUS | Status: DC
  Administered 2022-04-24 – 2022-04-25 (×3): 3 mL via INTRAVENOUS

## 2022-04-23 MED ORDER — SODIUM CHLORIDE 0.9 % IV SOLN: INTRAVENOUS | Status: DC

## 2022-04-23 MED ORDER — MILRINONE LACTATE IN DEXTROSE 20-5 MG/100ML-% IV SOLN: 0.3000 ug/kg/min | INTRAVENOUS | Status: DC

## 2022-04-23 MED ORDER — NICOTINE 14 MG/24HR TD PT24
14.0000 mg | MEDICATED_PATCH | Freq: Every day | TRANSDERMAL | Status: DC
  Filled 2022-04-23 (×10): qty 1

## 2022-04-23 MED ORDER — METOPROLOL TARTRATE 25 MG/10 ML ORAL SUSPENSION
12.5000 mg | Freq: Two times a day (BID) | ORAL | Status: DC
  Filled 2022-04-23 (×7): qty 5

## 2022-04-23 MED ORDER — BISACODYL 10 MG RE SUPP
10.0000 mg | Freq: Every day | RECTAL | Status: DC
  Filled 2022-04-23 (×2): qty 1

## 2022-04-23 MED ORDER — MIDAZOLAM HCL (PF) 5 MG/ML IJ SOLN
INTRAMUSCULAR | Status: DC | PRN
  Administered 2022-04-23 (×2): 2 mg via INTRAVENOUS
  Administered 2022-04-23 (×4): 1 mg via INTRAVENOUS
  Administered 2022-04-23: 2 mg via INTRAVENOUS

## 2022-04-23 MED ORDER — SODIUM CHLORIDE 0.9% FLUSH: 3.0000 mL | INTRAVENOUS | Status: DC | PRN

## 2022-04-23 MED ORDER — SODIUM CHLORIDE (PF) 0.9 % IJ SOLN
INTRAMUSCULAR | Status: AC
  Filled 2022-04-23: qty 10

## 2022-04-23 MED ORDER — CHLORHEXIDINE GLUCONATE 0.12 % MT SOLN
15.0000 mL | OROMUCOSAL | Status: AC
  Administered 2022-04-23: 15 mL via OROMUCOSAL
  Filled 2022-04-23: qty 15

## 2022-04-23 MED ORDER — BISACODYL 5 MG PO TBEC
10.0000 mg | DELAYED_RELEASE_TABLET | Freq: Every day | ORAL | Status: DC
  Administered 2022-04-24 – 2022-05-17 (×3): 10 mg via ORAL
  Filled 2022-04-23 (×13): qty 2

## 2022-04-23 MED ORDER — SODIUM CHLORIDE 0.9 % IV SOLN
12.5000 mg | Freq: Four times a day (QID) | INTRAVENOUS | Status: DC | PRN
  Administered 2022-04-25 – 2022-04-29 (×2): 12.5 mg via INTRAVENOUS
  Filled 2022-04-23: qty 0.5
  Filled 2022-04-23: qty 12.5
  Filled 2022-04-23: qty 0.5

## 2022-04-23 MED ORDER — FENTANYL CITRATE (PF) 250 MCG/5ML IJ SOLN
INTRAMUSCULAR | Status: DC | PRN
  Administered 2022-04-23: 50 ug via INTRAVENOUS
  Administered 2022-04-23: 100 ug via INTRAVENOUS
  Administered 2022-04-23: 200 ug via INTRAVENOUS
  Administered 2022-04-23 (×4): 50 ug via INTRAVENOUS
  Administered 2022-04-23 (×3): 100 ug via INTRAVENOUS
  Administered 2022-04-23: 50 ug via INTRAVENOUS
  Administered 2022-04-23: 100 ug via INTRAVENOUS
  Administered 2022-04-23: 50 ug via INTRAVENOUS
  Administered 2022-04-23 (×2): 100 ug via INTRAVENOUS

## 2022-04-23 MED ORDER — PANTOPRAZOLE SODIUM 40 MG PO TBEC: 40.0000 mg | DELAYED_RELEASE_TABLET | Freq: Every day | ORAL | Status: DC

## 2022-04-23 MED ORDER — ASPIRIN 325 MG PO TBEC
325.0000 mg | DELAYED_RELEASE_TABLET | Freq: Every day | ORAL | Status: DC
  Administered 2022-04-24 – 2022-05-02 (×8): 325 mg via ORAL
  Filled 2022-04-23 (×9): qty 1

## 2022-04-23 MED ORDER — FENTANYL CITRATE PF 50 MCG/ML IJ SOSY
50.0000 ug | PREFILLED_SYRINGE | INTRAMUSCULAR | Status: DC | PRN
  Administered 2022-04-23 (×2): 50 ug via INTRAVENOUS
  Filled 2022-04-23 (×2): qty 1

## 2022-04-23 MED ORDER — MIDAZOLAM HCL (PF) 10 MG/2ML IJ SOLN
INTRAMUSCULAR | Status: AC
  Filled 2022-04-23: qty 2

## 2022-04-23 MED ORDER — 0.9 % SODIUM CHLORIDE (POUR BTL) OPTIME
TOPICAL | Status: DC | PRN
  Administered 2022-04-23: 5000 mL

## 2022-04-23 MED ORDER — GLUTARALDEHYDE 0.625% SOAKING SOLUTION
TOPICAL | Status: DC | PRN
  Administered 2022-04-23: 1 via TOPICAL

## 2022-04-23 MED ORDER — PROPOFOL 10 MG/ML IV BOLUS
INTRAVENOUS | Status: DC | PRN
  Administered 2022-04-23: 100 mg via INTRAVENOUS
  Administered 2022-04-23 (×2): 20 mg via INTRAVENOUS

## 2022-04-23 MED ORDER — PHENYLEPHRINE 80 MCG/ML (10ML) SYRINGE FOR IV PUSH (FOR BLOOD PRESSURE SUPPORT)
PREFILLED_SYRINGE | INTRAVENOUS | Status: DC | PRN
  Administered 2022-04-23: 40 ug via INTRAVENOUS
  Administered 2022-04-23 (×2): 80 ug via INTRAVENOUS
  Administered 2022-04-23: 40 ug via INTRAVENOUS
  Administered 2022-04-23: 80 ug via INTRAVENOUS
  Administered 2022-04-23: 160 ug via INTRAVENOUS
  Administered 2022-04-23: 80 ug via INTRAVENOUS
  Administered 2022-04-23: 160 ug via INTRAVENOUS
  Administered 2022-04-23: 80 ug via INTRAVENOUS
  Administered 2022-04-23: 160 ug via INTRAVENOUS
  Administered 2022-04-23: 80 ug via INTRAVENOUS
  Administered 2022-04-23: 160 ug via INTRAVENOUS

## 2022-04-23 SURGICAL SUPPLY — 97 items
ADAPTER CARDIO PERF ANTE/RETRO (ADAPTER) ×2 IMPLANT
ADPR PRFSN 84XANTGRD RTRGD (ADAPTER) ×2
BAG DECANTER FOR FLEXI CONT (MISCELLANEOUS) ×2 IMPLANT
BLADE CLIPPER SURG (BLADE) ×2 IMPLANT
BLADE STERNUM SYSTEM 6 (BLADE) ×2 IMPLANT
BLADE SURG 15 STRL LF DISP TIS (BLADE) ×2 IMPLANT
BLADE SURG 15 STRL SS (BLADE)
CANISTER SUCT 3000ML PPV (MISCELLANEOUS) ×2 IMPLANT
CANN PRFSN 3/8XCNCT ST RT ANG (MISCELLANEOUS) ×2
CANN PRFSN 3/8XRT ANG TPR 14 (MISCELLANEOUS) ×2
CANNULA GUNDRY RCSP 15FR (MISCELLANEOUS) ×2 IMPLANT
CANNULA MC2 2 STG 29/37 NON-V (CANNULA) IMPLANT
CANNULA MC2 TWO STAGE (CANNULA)
CANNULA NON VENT 20FR 12 (CANNULA) ×2 IMPLANT
CANNULA NON VENT 22FR 12 (CANNULA) IMPLANT
CANNULA PRFSN 3/8XCNCT RT ANG (MISCELLANEOUS) IMPLANT
CANNULA PRFSN 3/8XRT ANG TPR14 (MISCELLANEOUS) IMPLANT
CANNULA SUMP PERICARDIAL (CANNULA) IMPLANT
CANNULA VEN MTL TIP RT (MISCELLANEOUS) ×4
CATH CPB KIT HENDRICKSON (MISCELLANEOUS) ×2 IMPLANT
CATH CPB KIT OWEN (MISCELLANEOUS) IMPLANT
CATH HEART VENT LEFT (CATHETERS) ×2 IMPLANT
CATH ROBINSON RED A/P 18FR (CATHETERS) ×6 IMPLANT
CNTNR URN SCR LID CUP LEK RST (MISCELLANEOUS) ×2 IMPLANT
CONN 1/2X1/2X1/2  BEN (MISCELLANEOUS) ×2
CONN 1/2X1/2X1/2 BEN (MISCELLANEOUS) IMPLANT
CONN ST 1/2X1/2  BEN (MISCELLANEOUS) ×2
CONN ST 1/2X1/2 BEN (MISCELLANEOUS) ×2 IMPLANT
CONN ST 1/4X3/8  BEN (MISCELLANEOUS) ×4
CONN ST 1/4X3/8 BEN (MISCELLANEOUS) ×4 IMPLANT
CONN ST 3/8 X 1/2 (MISCELLANEOUS) IMPLANT
CONNECTOR BLAKE 2:1 CARIO BLK (MISCELLANEOUS) IMPLANT
CONT SPEC 4OZ STRL OR WHT (MISCELLANEOUS) ×6
CONTAINER PROTECT SURGISLUSH (MISCELLANEOUS) ×4 IMPLANT
COVER SURGICAL LIGHT HANDLE (MISCELLANEOUS) ×2 IMPLANT
DRAIN CHANNEL 28F RND 3/8 FF (WOUND CARE) ×4 IMPLANT
DRAIN CONNECTOR BLAKE 1:1 (MISCELLANEOUS) IMPLANT
DRAPE CARDIOVASCULAR INCISE (DRAPES) ×2
DRAPE SRG 135X102X78XABS (DRAPES) ×2 IMPLANT
DRAPE WARM FLUID 44X44 (DRAPES) ×2 IMPLANT
DRSG AQUACEL AG ADV 3.5X10 (GAUZE/BANDAGES/DRESSINGS) IMPLANT
DRSG COVADERM 4X14 (GAUZE/BANDAGES/DRESSINGS) ×2 IMPLANT
ELECT BLADE 4.0 EZ CLEAN MEGAD (MISCELLANEOUS) ×2
ELECT CAUTERY BLADE 6.4 (BLADE) ×2 IMPLANT
ELECT REM PT RETURN 9FT ADLT (ELECTROSURGICAL) ×4
ELECTRODE BLDE 4.0 EZ CLN MEGD (MISCELLANEOUS) IMPLANT
ELECTRODE REM PT RTRN 9FT ADLT (ELECTROSURGICAL) ×4 IMPLANT
FELT TEFLON 1X6 (MISCELLANEOUS) ×4 IMPLANT
GAUZE 4X4 16PLY ~~LOC~~+RFID DBL (SPONGE) ×2 IMPLANT
GAUZE SPONGE 4X4 12PLY STRL (GAUZE/BANDAGES/DRESSINGS) ×2 IMPLANT
GLOVE BIO SURGEON STRL SZ 6 (GLOVE) IMPLANT
GLOVE BIO SURGEON STRL SZ 6.5 (GLOVE) IMPLANT
GLOVE BIO SURGEON STRL SZ7 (GLOVE) ×6 IMPLANT
GLOVE BIO SURGEON STRL SZ7.5 (GLOVE) IMPLANT
GLOVE BIOGEL PI IND STRL 6.5 (GLOVE) IMPLANT
GLOVE EUDERMIC 7 POWDERFREE (GLOVE) ×4 IMPLANT
GOWN STRL REUS W/ TWL LRG LVL3 (GOWN DISPOSABLE) ×8 IMPLANT
GOWN STRL REUS W/ TWL XL LVL3 (GOWN DISPOSABLE) ×4 IMPLANT
GOWN STRL REUS W/TWL LRG LVL3 (GOWN DISPOSABLE) ×8
GOWN STRL REUS W/TWL XL LVL3 (GOWN DISPOSABLE) ×8
HEMOSTAT POWDER SURGIFOAM 1G (HEMOSTASIS) ×6 IMPLANT
HEMOSTAT SURGICEL 2X14 (HEMOSTASIS) ×2 IMPLANT
INSERT SUTURE HOLDER (MISCELLANEOUS) ×2 IMPLANT
KIT BASIN OR (CUSTOM PROCEDURE TRAY) ×2 IMPLANT
KIT SUCTION CATH 14FR (SUCTIONS) ×2 IMPLANT
KIT TURNOVER KIT B (KITS) ×2 IMPLANT
LEAD PACING MYOCARDI (MISCELLANEOUS) IMPLANT
LINE VENT (MISCELLANEOUS) IMPLANT
LOOP VESSEL SUPERMAXI WHITE (MISCELLANEOUS) IMPLANT
NS IRRIG 1000ML POUR BTL (IV SOLUTION) ×12 IMPLANT
PACK E OPEN HEART (SUTURE) ×2 IMPLANT
PACK OPEN HEART (CUSTOM PROCEDURE TRAY) ×2 IMPLANT
PAD ARMBOARD 7.5X6 YLW CONV (MISCELLANEOUS) ×4 IMPLANT
POSITIONER HEAD DONUT 9IN (MISCELLANEOUS) ×2 IMPLANT
RING TRICUSPID T28 (Prosthesis & Implant Heart) IMPLANT
SET MPS 3-ND DEL (MISCELLANEOUS) IMPLANT
SPONGE T-LAP 18X18 ~~LOC~~+RFID (SPONGE) ×8 IMPLANT
SUT BONE WAX W31G (SUTURE) ×2 IMPLANT
SUT EB EXC GRN/WHT 2-0 V-5 (SUTURE) ×4 IMPLANT
SUT ETHIBOND 2 0 SH (SUTURE) ×4
SUT ETHIBOND 2 0 SH 36X2 (SUTURE) IMPLANT
SUT GORETEX CV 4 TH 22 36 (SUTURE) IMPLANT
SUT PROLENE 3 0 SH DA (SUTURE) IMPLANT
SUT PROLENE 3 0 SH1 36 (SUTURE) ×2 IMPLANT
SUT PROLENE 4 0 RB 1 (SUTURE) ×6
SUT PROLENE 4 0 SH DA (SUTURE) IMPLANT
SUT PROLENE 4-0 RB1 .5 CRCL 36 (SUTURE) ×6 IMPLANT
SUT STEEL 6MS V (SUTURE) IMPLANT
SYSTEM SAHARA CHEST DRAIN ATS (WOUND CARE) ×2 IMPLANT
TAPE CLOTH SURG 4X10 WHT LF (GAUZE/BANDAGES/DRESSINGS) IMPLANT
TAPE PAPER 3X10 WHT MICROPORE (GAUZE/BANDAGES/DRESSINGS) IMPLANT
TOWEL GREEN STERILE (TOWEL DISPOSABLE) ×2 IMPLANT
TOWEL GREEN STERILE FF (TOWEL DISPOSABLE) ×2 IMPLANT
TRAY FOLEY SLVR 16FR TEMP STAT (SET/KITS/TRAYS/PACK) ×2 IMPLANT
UNDERPAD 30X36 HEAVY ABSORB (UNDERPADS AND DIAPERS) ×2 IMPLANT
VENT LEFT HEART 12002 (CATHETERS) ×4
WATER STERILE IRR 1000ML POUR (IV SOLUTION) ×4 IMPLANT

## 2022-04-23 NOTE — Progress Notes (Signed)
  Echocardiogram Echocardiogram Transesophageal has been performed.  Bobbye Charleston 04/23/2022, 8:36 AM

## 2022-04-23 NOTE — Op Note (Signed)
[address]       [place] [address]             [phone]                                           04/23/2022 Patient:  Reginald Clarke Pre-Op Dx: Tricuspid valve endocarditis    Post-op Dx: Same Procedure: Debridement of tricuspid valve endocarditis Tricuspid valve reconstruction with autologous pericardium Tricuspid valve annuloplasty with 28 mm MC 3 ring   Surgeon and Role:      * Lajuana Matte, MD - Primary    *M. Roddenberry, PA-C-  An experienced assistant was required given the complexity of this surgery and the standard of surgical care. The assistant was needed for exposure, dissection, suctioning, retraction of delicate tissues and sutures, instrument exchange and for overall help during this procedure.    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  He had previously undergone and angio vac debridement of his tricuspid valve and April of this year.  Unfortunately it was not completely debrided.  Despite being on IV antibiotics for several months he continued to have positive blood cultures and evidence of an enlarging vegetation.  Findings: There was a large vegetation involving the septal and posterior leaflet.  The anterior leaflet appeared preserved.  Once this was debrided the autologous pericardium was used to reconstruct the portion of the septal and posterior leaflet that were debrided.  The valve was essentially bicuspidized.  There is no evidence of any regurgitation after reconstruction.  Operative Technique: All invasive lines were placed in pre-op holding.  After the risks, benefits and alternatives were thoroughly discussed, the patient was  brought to the operative theatre.  Anesthesia was induced, and the patient was prepped and draped in normal sterile fashion.  An appropriate surgical pause was performed, and pre-operative antibiotics were dosed accordingly.  We began with an incision over the chest for the sternotomy.  This was carried down with bovie cautery, and the sternum was divided with a reciprocating saw.  Meticulous hemostasis was obtained.  The patient was systemically heparinized.   The sternal retractor was placed.  The pericardium was harvested and fixed in glutaraldehyde.    After we confirmed an appropriate ACT, the ascending aorta was cannulated in standard fashion.  Bicaval venous cannulation was performed.  Snares were placed around the IVC and SVC.  Cardiopulmonary bypass was initiated and we began to cool the patient to 32 degrees. The cross clamp was applied, and a dose of anterograde cardioplegia was given with good arrest of the heart.  Our atriotomy was made between the SVC and IVC.  The tricuspid valve was then exposed.  There was a large vegetation involving the septal and posterior leaflets.  This was completely debrided.  Three fourths of the septal leaflet was excised and the entire posterior leaflet was excised.  We then placed our annular sutures.  Pericardium was then sutured to the free edge of the annulus along the septum and posterior portion.  We then used 4-0 Gore-Tex suture  for new cords.  The pericardium was trimmed to an appropriate length testing and several times.  We sized the anterior leaflet to a 28 mm MC 3 ring and tied it down in place.  We then fixed our needle cords to the pericardium.  After testing there was no evidence of any tricuspid valve regurgitation.  We then began to rewarm and gave a dose warm antegrade cardioplegia and removed our cross-clamp.  The atriotomy was closed in 2 layers.  We confirmed that we had achieved a good repair on intraoperative TEE.  Once we were satisfying, we  separated from cardiopulmonary bypass without event.    The heparin was reversed with protamine, and hemostasis was obtained.  Chest tubes and wires were placed, and the sternum was re-approximated with with sternal wires.  The soft tissue and skin were re-approximated wth absorbable suture.    The patient tolerated the procedure without any immediate complications, and was transferred to the ICU in guarded condition.  Rodnesha Elie Keane Scrape

## 2022-04-23 NOTE — Anesthesia Procedure Notes (Signed)
Arterial Line Insertion Start/End9/19/2023 7:00 AM, 04/23/2022 7:05 AM Performed by: Santa Lighter, MD, anesthesiologist  Patient location: Pre-op. Preanesthetic checklist: patient identified, IV checked, site marked, risks and benefits discussed, surgical consent, monitors and equipment checked, pre-op evaluation, timeout performed and anesthesia consent Lidocaine 1% used for infiltration Left, radial was placed Catheter size: 20 G Hand hygiene performed  and maximum sterile barriers used   Attempts: 1 Procedure performed using ultrasound guided technique. Ultrasound Notes:anatomy identified, needle tip was noted to be adjacent to the nerve/plexus identified, no ultrasound evidence of intravascular and/or intraneural injection and image(s) printed for medical record Following insertion, dressing applied and Biopatch. Post procedure assessment: normal and unchanged  Patient tolerated the procedure well with no immediate complications.

## 2022-04-23 NOTE — Progress Notes (Signed)
This chaplain is present for F/U spiritual care as a member of [facility]. The chaplain is appreciative of the RN's update on family visitation and discussion of Pt. care plan.  Chaplain Sallyanne Kuster [phone]

## 2022-04-23 NOTE — Progress Notes (Signed)
Initiated Open Heart Rapid Wean per Protocol 

## 2022-04-23 NOTE — Anesthesia Procedure Notes (Signed)
Central Venous Catheter Insertion Performed by: Santa Lighter, MD, anesthesiologist Start/End9/19/2023 6:50 AM, 04/23/2022 7:00 AM Patient location: Pre-op. Preanesthetic checklist: patient identified, IV checked, site marked, risks and benefits discussed, surgical consent, monitors and equipment checked, pre-op evaluation, timeout performed and anesthesia consent Position: Trendelenburg Lidocaine 1% used for infiltration and patient sedated Hand hygiene performed , maximum sterile barriers used  and Seldinger technique used Catheter size: 9 Fr Central line was placed.MAC introducer Procedure performed using ultrasound guided technique. Ultrasound Notes:anatomy identified, needle tip was noted to be adjacent to the nerve/plexus identified, no ultrasound evidence of intravascular and/or intraneural injection and image(s) printed for medical record Attempts: 1 Following insertion, line sutured, dressing applied and Biopatch. Post procedure assessment: free fluid flow, blood return through all ports and no air  Patient tolerated the procedure well with no immediate complications. Additional procedure comments: Triple lumen infusion catheter placed through introducer port.Marland Kitchen

## 2022-04-23 NOTE — Anesthesia Procedure Notes (Signed)
Procedure Name: Intubation Date/Time: 04/23/2022 7:59 AM  Performed by: Erick Colace, CRNAPre-anesthesia Checklist: Patient identified, Emergency Drugs available, Suction available and Patient being monitored Patient Re-evaluated:Patient Re-evaluated prior to induction Oxygen Delivery Method: Circle system utilized Preoxygenation: Pre-oxygenation with 100% oxygen Induction Type: IV induction Ventilation: Mask ventilation without difficulty Laryngoscope Size: Mac and 4 Grade View: Grade I Tube type: Oral Tube size: 8.0 mm Number of attempts: 1 Airway Equipment and Method: Stylet and Oral airway Placement Confirmation: ETT inserted through vocal cords under direct vision, positive ETCO2 and breath sounds checked- equal and bilateral Secured at: 23 cm Tube secured with: Tape Dental Injury: Teeth and Oropharynx as per pre-operative assessment

## 2022-04-23 NOTE — Progress Notes (Signed)
     [address]       [place] [address]             [phone]           [address]       [place], [address]             [phone]       EVENING ROUNDS POD #0 from TV repair Intubated but awake and moving all ext Minimal inotropic support Not bleeding Starting to wean vent Good urine output

## 2022-04-23 NOTE — Progress Notes (Addendum)
Received pt from OR to 2H12 at 1300 and placed on vent with no complications.

## 2022-04-23 NOTE — Procedures (Signed)
Extubation Procedure Note  Patient Details:   Name: Reginald Clarke DOB: [birth year] MRN: [id number]   Airway Documentation:    Vent end date: 04/23/22 Vent end time: 1805   Evaluation  O2 sats: stable throughout and currently acceptable Complications: No apparent complications Patient did tolerate procedure well. Bilateral Breath Sounds: Diminished   Yes  Nif -30, VC 900, ABG within normal limits, Pt extubated to 3L [place] per Open Heart Rapid Wean Protocol. Positive Leak noted prior to extubation. No stridor noted post extubation. RT will continue to closely monitor pt.   Jesse Sans 04/23/2022, 6:10 PM

## 2022-04-23 NOTE — Anesthesia Postprocedure Evaluation (Signed)
Anesthesia Post Note  Patient: Reginald Clarke  Procedure(s) Performed: MULTIPLE EXTRACTION WITH ALVEOLOPLASTY     Patient location during evaluation: PACU Anesthesia Type: General Level of consciousness: awake Pain management: pain level controlled Vital Signs Assessment: post-procedure vital signs reviewed and stable Respiratory status: spontaneous breathing Cardiovascular status: stable Postop Assessment: no apparent nausea or vomiting Anesthetic complications: no   No notable events documented.  Last Vitals:  Vitals:   04/23/22 0719 04/23/22 0720  BP: 106/63   Pulse: (!) 108 (!) 105  Resp: (!) 23 20  Temp:    SpO2: 99% 99%    Last Pain:  Vitals:   04/23/22 0515  TempSrc: Oral  PainSc:                  [name]

## 2022-04-23 NOTE — Transfer of Care (Signed)
Immediate Anesthesia Transfer of Care Note  Patient: Reginald Clarke  Procedure(s) Performed: TRICUSPID VALVE REPAIR WITH 28MM MC3 TRICUSPID ANNULOPLASTY RING (Chest) TRANSESOPHAGEAL ECHOCARDIOGRAM (TEE) (Esophagus)  Patient Location: PACU  Anesthesia Type:General  Level of Consciousness: Patient remains intubated per anesthesia plan  Airway & Oxygen Therapy: Patient remains intubated per anesthesia plan and Patient placed on Ventilator (see vital sign flow sheet for setting)  Post-op Assessment: Report given to RN and Post -op Vital signs reviewed and stable  Post vital signs: Reviewed and stable  Last Vitals:  Vitals Value Taken Time  BP 143/98 04/23/22 1308  Temp    Pulse 118 04/23/22 1308  Resp 15 04/23/22 1308  SpO2 97 % 04/23/22 1308  Vitals shown include unvalidated device data.  Last Pain:  Vitals:   04/23/22 0515  TempSrc: Oral  PainSc:       Patients Stated Pain Goal: 6 (44/69/50 7225)  Complications: No notable events documented.

## 2022-04-23 NOTE — Consult Note (Signed)
NAME:  Reginald Clarke, MRN:  [id number], DOB:  03-Feb-1991, LOS: 57 ADMISSION DATE:  02/25/2022, CONSULTATION DATE:  9/19 REFERRING MD:  Dr. Jerral Ralph, CHIEF COMPLAINT:  Tricuspid valve replacement   History of Present Illness:  Patient is a 31 yo male w/ pertinent PMH of recurrent MSSA native tricuspid valve endocarditis presents to [facility] 9/14 for elective tricuspid valve replacement.  On 4/12 patient hospitalized with MSSA bacteremia w/ native tricuspid valve endocarditis. Despite antibiotics patient still with recurrent positive MSSA blood cultures. Coming into [facility] on 9/14 for elective tricuspid valve replacement. ID following and has patient on ceftaroline/daptomycin. On 9/14 Dental medicine performed multiple extractions. On 9/19 patient received tricuspid valve replacement. Admitted to ICU post procedure. Post op intubated on mech vent and on 3 pressors. PCCM consulted.  Pertinent  Medical History   Past Medical History:  Diagnosis Date   Anxiety    Endocarditis    Hepatitis    hx of hep C per pt   Opiate dependence ([name])    on methadone     Significant Hospital Events: Including procedures, antibiotic start and stop dates in addition to other pertinent events   4/12-5/10>> hospitalization for MSSA bacteremia with native tricuspid valve endocarditis.  Underwent angio vac procedure on 4/26.Completed cefazolin on 5/10-planned weekly dalbavancin at the ID clinic.   5/22>> outpatient blood culture positive for MSSA 5/24-6/8>> admit to [facility] for persistent bacteremia-worsening back pain-discharge to [facility] on IV Ancef. 7/5>> discharged from [facility] on oral cefdinir.  Continued to have intermittent fever postdischarge.   7/24>> admit for-persistent fever/nausea/vomiting/diarrhea-blood cultures positive for MSSA. 9/5>> fever-repeat blood cultures growing Staph aureus supposed to finish IV antibiotic treatment 9/6 9/5 >> PICC line discontinued 9/14 >> admitted to [facility] for elective TVR; multiple  teeth extracted; on ceftaroline and dapto; id following 9/19>>s/p TVR; intubated post op; in shock on 3 pressors  Interim History / Subjective:  Patient sedated w/ precedex Intubated on mech vent On levo, vaso, neo Flow track in place  Objective   Blood pressure 106/63, pulse (!) 105, temperature 98.3 F (36.8 C), temperature source Oral, resp. rate 20, height 5\' 10"  (1.778 m), weight 79.4 kg, SpO2 99 %.    Set Rate:  [18 bmp] 18 bmp   Intake/Output Summary (Last 24 hours) at 04/23/2022 1359 Last data filed at 04/23/2022 1250 Gross per 24 hour  Intake 2828 ml  Output 2334 ml  Net 494 ml   Filed Weights   04/15/22 0420 04/17/22 0500 04/18/22 1424  Weight: 81.5 kg 80.9 kg 79.4 kg    Examination: General:  critically ill appearing on mech vent HEENT: MM pink/moist; multiple teeth extracted; ETT in place Neuro: sedated; PERRL CV: s1s2, RRR PULM:  dim clear BS bilaterally; on mech vent PRVC GI: soft, bsx4 active  Extremities: warm/dry, no edema  Skin: no rashes or lesions appreciated   Resolved Hospital Problem list     Assessment & Plan:  Shock: likely septic vs. cardiogenic S/p TVR 9/19 Recurrent MSSA bacteremia w/ native tricuspid valve endocarditis P: -TCTS following; appreciate recs -Continue abx (ceftaroline/daptomycin) and f/u cultures per ID -Vanc given for surgical prophylaxis -telemetry monitoring -continue levo and vaso; work on weaning off neo first for map goal >65 -albumin given per protocol -check stat H/H; f/u coags -continue insulin gtt per protocol -CT management per protocol -Pian management  Post op mechanical ventilation P: -LTVV strategy with tidal volumes of 6-8 cc/kg ideal body weight -insert ETT 2 cm -increase RR to 24  -repeat  abg in few hours -Wean PEEP/FiO2 for SpO2 >92% -VAP bundle in place -wean per protocol -PAD protocol in place -wean sedation for RASS goal 0 to -1 -Pulm hygiene  Normocytic anemia P: -trend  cbc  Anxiety Methamphetamine/chronic methadone use P: -hold xanax while sedation on board -continue lexapro -continue methadone  Chronic HCV P: -further f/u outpatient with ID  Mild transaminitis P: -trend cmp   Best Practice (right click and "Reselect all SmartList Selections" daily)   Diet/type: NPO w/ meds via tube DVT prophylaxis: LMWH GI prophylaxis: PPI Lines: Central line and Arterial Line Foley:  Yes, and it is still needed Code Status:  full code Last date of multidisciplinary goals of care discussion [9/19 mother updated at bedside]  Labs   CBC: Recent Labs  Lab 04/17/22 0304 04/22/22 0407 04/23/22 0549 04/23/22 0808 04/23/22 1111 04/23/22 1112 04/23/22 1116 04/23/22 1200 04/23/22 1203 04/23/22 1327  WBC 6.0 7.0 8.6  --   --   --   --   --   --  38.0*  HGB 8.5* 10.7* 10.4*   < > 7.4* 7.8* 8.2* 7.8* 8.2* 8.6*  HCT 26.4* 33.9* 33.4*   < > 22.6* 23.0* 24.0* 23.0* 24.0* 26.5*  MCV 83.8 83.7 83.9  --   --   --   --   --   --  85.5  PLT 178 234 185  --  105*  --   --   --   --  179   < > = values in this interval not displayed.    Basic Metabolic Panel: Recent Labs  Lab 04/17/22 0304 04/22/22 0407 04/23/22 0549 04/23/22 5809 04/23/22 0902 04/23/22 0943 04/23/22 1015 04/23/22 1022 04/23/22 1044 04/23/22 1047 04/23/22 1112 04/23/22 1116 04/23/22 1200 04/23/22 1203  NA 137 139 134*   < > 136   < > 134*   < > 135 135 137 136 138 137  K 4.2 4.8 4.3   < > 4.2   < > 5.5*   < > 5.4* 5.4* 5.2* 5.2* 4.9 4.9  CL 103 100 98   < > 101  --  101  --  102  --  102  --  105  --   CO2 26 29 26   --   --   --   --   --   --   --   --   --   --   --   GLUCOSE 95 98 119*   < > 133*  --  201*  --  232*  --  211*  --  189*  --   BUN 10 19 19    < > 17  --  19  --  18  --  18  --  16  --   CREATININE 0.80 0.90 0.87   < > 0.50*  --  0.70  --  0.70  --  0.60*  --  0.50*  --   CALCIUM 8.7* 9.9 9.9  --   --   --   --   --   --   --   --   --   --   --    < > = values  in this interval not displayed.   GFR: Estimated Creatinine Clearance: 138.1 mL/min (A) (by C-G formula based on SCr of 0.5 mg/dL (L)). Recent Labs  Lab 04/17/22 0304 04/22/22 0407 04/23/22 0549 04/23/22 1327  WBC 6.0 7.0 8.6 38.0*    Liver Function  Tests: Recent Labs  Lab 04/22/22 0407  AST 33  ALT 33  ALKPHOS 75  BILITOT 0.5  PROT 7.7  ALBUMIN 3.2*   No results for input(s): "LIPASE", "AMYLASE" in the last 168 hours. No results for input(s): "AMMONIA" in the last 168 hours.  ABG    Component Value Date/Time   PHART 7.284 (L) 04/23/2022 1203   PCO2ART 53.6 (H) 04/23/2022 1203   PO2ART 484 (H) 04/23/2022 1203   HCO3 25.4 04/23/2022 1203   TCO2 27 04/23/2022 1203   ACIDBASEDEF 1.0 04/23/2022 1203   O2SAT 100 04/23/2022 1203     Coagulation Profile: Recent Labs  Lab 04/22/22 0407  INR 1.1    Cardiac Enzymes: Recent Labs  Lab 04/18/22 0426  CKTOTAL 12*    HbA1C: No results found for: "HGBA1C"  CBG: Recent Labs  Lab 04/23/22 1321  GLUCAP 134*    Review of Systems:   Patient is encephalopathic and/or intubated. Therefore history has been obtained from chart review.    Past Medical History:  He,  has a past medical history of Anxiety, Endocarditis, Hepatitis, and Opiate dependence ([name]).   Surgical History:   Past Surgical History:  Procedure Laterality Date   APPLICATION OF ANGIOVAC N/A 11/28/2021   Procedure: APPLICATION OF ANGIOVAC;  Surgeon: Corliss SkainsLightfoot, Harrell O, MD;  Location: [facility] OR;  Service: Vascular;  Laterality: N/A;   MULTIPLE EXTRACTIONS WITH ALVEOLOPLASTY N/A 04/18/2022   Procedure: MULTIPLE EXTRACTION WITH ALVEOLOPLASTY;  Surgeon: Sharman Cheekwsley, Madison B, DMD;  Location: [facility] OR;  Service: Dentistry;  Laterality: N/A;   TEE WITHOUT CARDIOVERSION N/A 11/23/2021   Procedure: TRANSESOPHAGEAL ECHOCARDIOGRAM (TEE);  Surgeon: Christell Constanthandrasekhar, Mahesh A, MD;  [facility];  Service: Cardiovascular;  Laterality: N/A;   TEE WITHOUT  CARDIOVERSION N/A 11/28/2021   Procedure: TRANSESOPHAGEAL ECHOCARDIOGRAM (TEE);  Surgeon: Corliss SkainsLightfoot, Harrell O, MD;  [facility];  Service: Open Heart Surgery;  Laterality: N/A;   TEE WITHOUT CARDIOVERSION N/A 03/07/2022   Procedure: TRANSESOPHAGEAL ECHOCARDIOGRAM (TEE);  Surgeon: Christell Constanthandrasekhar, Mahesh A, MD;  [facility];  Service: Cardiovascular;  Laterality: N/A;   tubes in ears     WRIST FRACTURE SURGERY       Social History:   reports that he quit smoking about 4 months ago. His smoking use included cigarettes. He has a 19.00 pack-year smoking history. He has never used smokeless tobacco. He reports current drug use. Drugs: Methamphetamines, Marijuana, and Heroin. He reports that he does not drink alcohol.   Family History:  His family history is not on file.   Allergies No Known Allergies   Home Medications  Prior to Admission medications   Medication Sig Start Date End Date Taking? Authorizing Provider  acetaminophen (TYLENOL) 650 MG CR tablet Take 650 mg by mouth every 8 (eight) hours as needed for pain.   Yes [provider]  methadone (DOLOPHINE) 1 MG/1ML solution Take 135 mg by mouth daily. 04/24/20  Yes [provider]  methocarbamol (ROBAXIN) 500 MG tablet Take 1 tablet (500 mg total) by mouth every 8 (eight) hours as needed for muscle spasms. 12/12/21  Yes Osvaldo ShipperKrishnan, Gokul, MD  ondansetron (ZOFRAN) 4 MG tablet Take 4 mg by mouth every 8 (eight) hours as needed. 02/06/22  Yes [provider]  pantoprazole (PROTONIX) 40 MG tablet Take 1 tablet (40 mg total) by mouth daily. 12/13/21  Yes Osvaldo ShipperKrishnan, Gokul, MD  ALPRAZolam Prudy Feeler(XANAX) 0.25 MG tablet Take 1 tablet (0.25 mg total) by mouth 2 (two) times daily as needed for anxiety.  Patient not taking: Reported on 02/25/2022 12/12/21   Osvaldo Shipper, MD  ibuprofen (ADVIL) 600 MG tablet Take 1 tablet (600 mg total) by mouth every 8 (eight) hours as needed for moderate pain. Patient not taking: Reported on  02/25/2022 01/10/22   Leroy Sea, MD     Critical care time: 50 minutes    JD Anselm Lis  Pulmonary & Critical Care 04/23/2022, 1:59 PM  Please see [email or web address] for pager details.  From 7A-7P if no response, please call [phone]. After hours, please call ELink [phone].

## 2022-04-23 NOTE — Brief Op Note (Signed)
02/25/2022 - 04/23/2022  11:35 AM  PATIENT:  Reginald Clarke  31 y.o. male  PRE-OPERATIVE DIAGNOSIS:  TRICUSPID VALVE ENDOCARDITIS with SEVERE TRICUSPID REGURGITATION  POST-OPERATIVE DIAGNOSIS:  TRICUSPID VALVE ENDOCARDITIS with SEVERE TRICUSPID REGURGITATION  PROCEDURE:  TRICUSPID VALVE REPAIR WITH 28MM MC3 TRICUSPID ANNULOPLASTY RING   TRANSESOPHAGEAL ECHOCARDIOGRAM (TEE) (N/A)  SURGEON:   Lajuana Matte, MD - Primary  PHYSICIAN ASSISTANT: Sylver Vantassell  ASSISTANTSDineen Kid, RN, RN First Assistant   ANESTHESIA:   general  EBL:  224ml  BLOOD ADMINISTERED: 2 units PRBC's  DRAINS:  Pleural and mediastinal drainage tubes    LOCAL MEDICATIONS USED:  NONE  SPECIMEN: Tricuspid valve vegetation  DISPOSITION OF SPECIMEN:   pathology and microbiology lab for culture  COUNTS:  YES  DICTATION: .Dragon Dictation  PLAN OF CARE: Admit to inpatient   PATIENT DISPOSITION:  ICU - intubated and hemodynamically stable.   Delay start of Pharmacological VTE agent (>24hrs) due to surgical blood loss or risk of bleeding: yes

## 2022-04-23 NOTE — Progress Notes (Signed)
     [address]       [place] [address]             [phone]       No events Vitals:   04/22/22 0725 04/23/22 0515  BP: 127/72 98/60  Pulse: (!) 101 (!) 104  Resp: 17 16  Temp: 98.4 F (36.9 C) 98.3 F (36.8 C)  SpO2: 95% 97%   Alert NAD Sinus EWOB  OR today for tricuspid valve repair/replacement  Salvador Coupe O Adin Lariccia

## 2022-04-24 ENCOUNTER — Encounter (HOSPITAL_COMMUNITY): Payer: Self-pay | Admitting: Thoracic Surgery (Cardiothoracic Vascular Surgery)

## 2022-04-24 ENCOUNTER — Inpatient Hospital Stay (HOSPITAL_COMMUNITY): Payer: BLUE CROSS/BLUE SHIELD

## 2022-04-24 DIAGNOSIS — I019 Acute rheumatic heart disease, unspecified: Secondary | ICD-10-CM

## 2022-04-24 DIAGNOSIS — D62 Acute posthemorrhagic anemia: Secondary | ICD-10-CM

## 2022-04-24 DIAGNOSIS — J9600 Acute respiratory failure, unspecified whether with hypoxia or hypercapnia: Secondary | ICD-10-CM

## 2022-04-24 DIAGNOSIS — I079 Rheumatic tricuspid valve disease, unspecified: Secondary | ICD-10-CM | POA: Diagnosis not present

## 2022-04-24 DIAGNOSIS — R579 Shock, unspecified: Secondary | ICD-10-CM | POA: Diagnosis not present

## 2022-04-24 LAB — CBC
HCT: 19.1 % — ABNORMAL LOW (ref 39.0–52.0)
HCT: 24.5 % — ABNORMAL LOW (ref 39.0–52.0)
Hemoglobin: 6.5 g/dL — CL (ref 13.0–17.0)
Hemoglobin: 8.4 g/dL — ABNORMAL LOW (ref 13.0–17.0)
MCH: 28.1 pg (ref 26.0–34.0)
MCH: 29.6 pg (ref 26.0–34.0)
MCHC: 34 g/dL (ref 30.0–36.0)
MCHC: 34.3 g/dL (ref 30.0–36.0)
MCV: 82.7 fL (ref 80.0–100.0)
MCV: 86.3 fL (ref 80.0–100.0)
Platelets: 81 10*3/uL — ABNORMAL LOW (ref 150–400)
Platelets: 54 10*3/uL — ABNORMAL LOW (ref 150–400)
RBC: 2.31 MIL/uL — ABNORMAL LOW (ref 4.22–5.81)
RBC: 2.84 MIL/uL — ABNORMAL LOW (ref 4.22–5.81)
RDW: 15.8 % — ABNORMAL HIGH (ref 11.5–15.5)
RDW: 15.1 % (ref 11.5–15.5)
WBC: 8.5 10*3/uL (ref 4.0–10.5)
WBC: 6.4 10*3/uL (ref 4.0–10.5)
nRBC: 0 % (ref 0.0–0.2)
nRBC: 0 % (ref 0.0–0.2)

## 2022-04-24 LAB — GLUCOSE, CAPILLARY
Glucose-Capillary: 102 mg/dL — ABNORMAL HIGH (ref 70–99)
Glucose-Capillary: 103 mg/dL — ABNORMAL HIGH (ref 70–99)
Glucose-Capillary: 105 mg/dL — ABNORMAL HIGH (ref 70–99)
Glucose-Capillary: 105 mg/dL — ABNORMAL HIGH (ref 70–99)
Glucose-Capillary: 109 mg/dL — ABNORMAL HIGH (ref 70–99)
Glucose-Capillary: 112 mg/dL — ABNORMAL HIGH (ref 70–99)
Glucose-Capillary: 116 mg/dL — ABNORMAL HIGH (ref 70–99)
Glucose-Capillary: 116 mg/dL — ABNORMAL HIGH (ref 70–99)
Glucose-Capillary: 116 mg/dL — ABNORMAL HIGH (ref 70–99)
Glucose-Capillary: 124 mg/dL — ABNORMAL HIGH (ref 70–99)
Glucose-Capillary: 134 mg/dL — ABNORMAL HIGH (ref 70–99)
Glucose-Capillary: 77 mg/dL (ref 70–99)
Glucose-Capillary: 98 mg/dL (ref 70–99)

## 2022-04-24 LAB — BASIC METABOLIC PANEL
Anion gap: 6 (ref 5–15)
Anion gap: 3 — ABNORMAL LOW (ref 5–15)
BUN: 16 mg/dL (ref 6–20)
BUN: 18 mg/dL (ref 6–20)
CO2: 23 mmol/L (ref 22–32)
CO2: 29 mmol/L (ref 22–32)
Calcium: 8 mg/dL — ABNORMAL LOW (ref 8.9–10.3)
Calcium: 8.3 mg/dL — ABNORMAL LOW (ref 8.9–10.3)
Chloride: 105 mmol/L (ref 98–111)
Chloride: 105 mmol/L (ref 98–111)
Creatinine, Ser: 0.62 mg/dL (ref 0.61–1.24)
Creatinine, Ser: 0.82 mg/dL (ref 0.61–1.24)
GFR, Estimated: >60 mL/min (ref >60)
GFR, Estimated: >60 mL/min (ref >60)
Glucose, Bld: 130 mg/dL — ABNORMAL HIGH (ref 70–99)
Glucose, Bld: 105 mg/dL — ABNORMAL HIGH (ref 70–99)
Potassium: 4.1 mmol/L (ref 3.5–5.1)
Potassium: 4 mmol/L (ref 3.5–5.1)
Sodium: 134 mmol/L — ABNORMAL LOW (ref 135–145)
Sodium: 137 mmol/L (ref 135–145)

## 2022-04-24 LAB — POCT I-STAT 7, (LYTES, BLD GAS, ICA,H+H)
Acid-base deficit: 6 mmol/L — ABNORMAL HIGH (ref 0.0–2.0)
Bicarbonate: 18.7 mmol/L — ABNORMAL LOW (ref 20.0–28.0)
Calcium, Ion: 1.12 mmol/L — ABNORMAL LOW (ref 1.15–1.40)
HCT: 22 % — ABNORMAL LOW (ref 39.0–52.0)
Hemoglobin: 7.5 g/dL — ABNORMAL LOW (ref 13.0–17.0)
O2 Saturation: 96 %
Patient temperature: 37.6
Potassium: 4.8 mmol/L (ref 3.5–5.1)
Sodium: 137 mmol/L (ref 135–145)
TCO2: 20 mmol/L — ABNORMAL LOW (ref 22–32)
pCO2 arterial: 32.3 mmHg (ref 32–48)
pH, Arterial: 7.372 (ref 7.35–7.45)
pO2, Arterial: 82 mmHg — ABNORMAL LOW (ref 83–108)

## 2022-04-24 LAB — ECHO INTRAOPERATIVE TEE
Height: 70 in
Weight: 2800 oz

## 2022-04-24 LAB — PREPARE RBC (CROSSMATCH)

## 2022-04-24 LAB — MAGNESIUM
Magnesium: 2 mg/dL (ref 1.7–2.4)
Magnesium: 2.2 mg/dL (ref 1.7–2.4)

## 2022-04-24 LAB — SURGICAL PATHOLOGY

## 2022-04-24 MED ORDER — ORAL CARE MOUTH RINSE: 15.0000 mL | OROMUCOSAL | Status: DC | PRN

## 2022-04-24 MED ORDER — SODIUM CHLORIDE 0.9% IV SOLUTION: Freq: Once | INTRAVENOUS | Status: AC

## 2022-04-24 MED ORDER — GABAPENTIN 300 MG PO CAPS
300.0000 mg | ORAL_CAPSULE | Freq: Three times a day (TID) | ORAL | Status: DC
  Administered 2022-04-24 – 2022-06-04 (×124): 300 mg via ORAL
  Filled 2022-04-24 (×125): qty 1

## 2022-04-24 MED ORDER — ENOXAPARIN SODIUM 40 MG/0.4ML IJ SOSY: 40.0000 mg | PREFILLED_SYRINGE | Freq: Every day | INTRAMUSCULAR | Status: DC

## 2022-04-24 MED ORDER — DOCUSATE SODIUM 100 MG PO CAPS
100.0000 mg | ORAL_CAPSULE | Freq: Two times a day (BID) | ORAL | Status: DC
  Administered 2022-04-24 – 2022-05-15 (×13): 100 mg via ORAL
  Filled 2022-04-24 (×26): qty 1

## 2022-04-24 MED ORDER — INSULIN ASPART 100 UNIT/ML IJ SOLN: 0.0000 [IU] | INTRAMUSCULAR | Status: DC

## 2022-04-24 MED ORDER — METHADONE HCL 5 MG PO TABS
135.0000 mg | ORAL_TABLET | Freq: Every day | ORAL | Status: DC
  Administered 2022-04-24 – 2022-05-04 (×11): 135 mg via ORAL
  Filled 2022-04-24 (×3): qty 1
  Filled 2022-04-24: qty 14
  Filled 2022-04-24 (×6): qty 1
  Filled 2022-04-24: qty 14
  Filled 2022-04-24: qty 1

## 2022-04-24 MED ORDER — ACETAMINOPHEN 325 MG PO TABS: 650.0000 mg | ORAL_TABLET | Freq: Four times a day (QID) | ORAL | Status: DC | PRN

## 2022-04-24 MED ORDER — WARFARIN - PHYSICIAN DOSING INPATIENT: Freq: Every day | Status: DC

## 2022-04-24 MED ORDER — ENOXAPARIN SODIUM 40 MG/0.4ML IJ SOSY
40.0000 mg | PREFILLED_SYRINGE | Freq: Every day | INTRAMUSCULAR | Status: DC
  Administered 2022-04-25 – 2022-05-01 (×7): 40 mg via SUBCUTANEOUS
  Filled 2022-04-24 (×7): qty 0.4

## 2022-04-24 MED ORDER — ENSURE ENLIVE PO LIQD
237.0000 mL | Freq: Two times a day (BID) | ORAL | Status: DC
  Administered 2022-04-24 – 2022-06-04 (×80): 237 mL via ORAL
  Filled 2022-04-24 (×20): qty 237

## 2022-04-24 MED ORDER — OXYCODONE HCL 5 MG PO TABS
20.0000 mg | ORAL_TABLET | Freq: Four times a day (QID) | ORAL | Status: DC | PRN
  Administered 2022-04-27 – 2022-04-28 (×5): 20 mg via ORAL
  Filled 2022-04-24 (×7): qty 4

## 2022-04-24 MED ORDER — PANTOPRAZOLE SODIUM 40 MG PO TBEC
40.0000 mg | DELAYED_RELEASE_TABLET | Freq: Every day | ORAL | Status: DC
  Administered 2022-04-24 – 2022-06-04 (×42): 40 mg via ORAL
  Filled 2022-04-24 (×42): qty 1

## 2022-04-24 MED ORDER — WARFARIN SODIUM 2.5 MG PO TABS
2.5000 mg | ORAL_TABLET | Freq: Every day | ORAL | Status: DC
  Administered 2022-04-24 – 2022-04-27 (×4): 2.5 mg via ORAL
  Filled 2022-04-24 (×4): qty 1

## 2022-04-24 MED FILL — Lidocaine HCl Local Preservative Free (PF) Inj 2%: INTRAMUSCULAR | Qty: 14 | Status: AC

## 2022-04-24 MED FILL — Potassium Chloride Inj 2 mEq/ML: INTRAVENOUS | Qty: 40 | Status: AC

## 2022-04-24 MED FILL — Heparin Sodium (Porcine) Inj 1000 Unit/ML: Qty: 1000 | Status: AC

## 2022-04-24 NOTE — Progress Notes (Addendum)
[address]       [place] [address]             [phone]      TCTS DAILY ICU PROGRESS NOTE                   [address]            [place] [address]          [phone]   1 Day Post-Op Procedure(s) (LRB): TRICUSPID VALVE REPAIR WITH MC3 TRICUSPID ANNULOPLASTY RING (N/A) TRANSESOPHAGEAL ECHOCARDIOGRAM (TEE) (N/A)  Total Length of Stay:  LOS: 58 days   Subjective: Minor pain  Objective: Vital signs in last 24 hours: Temp:  [96.3 F (35.7 C)-101.7 F (38.7 C)] 100.6 F (38.1 C) (09/20 0700) Pulse Rate:  [79-141] 95 (09/20 0700) Cardiac Rhythm: Normal sinus rhythm (09/20 0400) Resp:  [14-39] 30 (09/20 0700) BP: (69-138)/(40-91) 114/61 (09/20 0700) SpO2:  [91 %-100 %] 98 % (09/20 0700) Arterial Line BP: (72-294)/(36-261) 138/48 (09/20 0700) FiO2 (%):  [50 %] 50 % (09/19 1528) Weight:  [80.2 kg] 80.2 kg (09/20 0500)  Filed Weights   04/17/22 0500 04/18/22 1424 04/24/22 0500  Weight: 80.9 kg 79.4 kg 80.2 kg    Weight change:    Hemodynamic parameters for last 24 hours:    Intake/Output from previous day: 09/19 0701 - 09/20 0700 In: 8640.4 [I.V.:4239.6; Blood:943; IV Piggyback:3457.8] Out: 4979 [Urine:2820; Blood:1299; Chest Tube:860]  Intake/Output this shift: No intake/output data recorded.  Current Meds: Scheduled Meds:  sodium chloride   Intravenous Once   acetaminophen  1,000 mg Oral Q6H   Or   acetaminophen (TYLENOL) oral liquid 160 mg/5 mL  1,000 mg Per Tube Q6H   aspirin EC  325 mg Oral Daily   Or   aspirin  324 mg Per Tube Daily   bisacodyl  10 mg Oral Daily   Or   bisacodyl  10 mg Rectal Daily   Chlorhexidine Gluconate Cloth  6 each Topical Q0600   docusate sodium  100 mg Oral BID   enoxaparin (LOVENOX) injection  40 mg Subcutaneous Q24H   escitalopram  10 mg Oral Daily   feeding supplement  237 mL Oral BID BM   gabapentin  300 mg Oral TID   methadone  135 mg Oral Daily   metoprolol  tartrate  12.5 mg Oral BID   Or   metoprolol tartrate  12.5 mg Per Tube BID   multivitamin with minerals  1 tablet Oral Daily   nicotine  14 mg Transdermal Daily   pantoprazole  40 mg Oral Daily   sodium chloride flush  3 mL Intravenous Q12H   vancomycin (VANCOCIN) 1,000 mg in sodium chloride 0.9 % 1,000 mL irrigation   Irrigation To OR   Continuous Infusions:  sodium chloride     sodium chloride     sodium chloride     albumin human 60 mL/hr at 04/24/22 0700   ceFTAROline (TEFLARO) IV Stopped (04/24/22 1751)   DAPTOmycin (CUBICIN) 650 mg in sodium chloride 0.9 % IVPB Stopped (04/23/22 2330)   dexmedetomidine (PRECEDEX) IV infusion Stopped (04/23/22 2140)   famotidine (PEPCID) IV Stopped (04/23/22 1343)   insulin 0.6 Units/hr (04/24/22 0700)   lactated ringers     lactated ringers 10 mL/hr at 04/24/22 0700   lactated ringers     lactated ringers 20 mL/hr at 04/24/22 0700   milrinone  nitroGLYCERIN Stopped (04/23/22 1336)   norepinephrine (LEVOPHED) Adult infusion 8 mcg/min (04/24/22 0719)   phenylephrine (NEO-SYNEPHRINE) Adult infusion 60 mcg/min (04/24/22 0700)   promethazine (PHENERGAN) injection (IM or IVPB)     sodium chloride     vasopressin 0.02 Units/min (04/24/22 0700)   PRN Meds:.sodium chloride, acetaminophen, albumin human, alum & mag hydroxide-simeth, bisacodyl, dextrose, diclofenac Sodium, docusate sodium, fentaNYL (SUBLIMAZE) injection, fentaNYL (SUBLIMAZE) injection, hydrOXYzine, ketorolac, lactated ringers, loperamide, methocarbamol, metoprolol tartrate, midazolam, morphine injection, oxyCODONE, polyethylene glycol, promethazine (PHENERGAN) injection (IM or IVPB), promethazine, sodium chloride, sodium chloride flush, sodium chloride flush  General appearance: alert, cooperative, and no distress Neurologic: intact Heart: regular rate and rhythm Lungs: clear to auscultation bilaterally Abdomen: benign Extremities: no edema , warm , well perfused Wound: dressing  CDI  Lab Results: CBC: Recent Labs    04/23/22 1956 04/24/22 0408  WBC 12.2* 8.5  HGB 7.7* 6.5*  HCT 22.8* 19.1*  PLT 103* 81*   BMET:  Recent Labs    04/23/22 1956 04/24/22 0408  NA 135 134*  K 4.8 4.1  CL 111 105  CO2 21* 23  GLUCOSE 152* 130*  BUN 16 16  CREATININE 0.64 0.62  CALCIUM 7.1* 8.0*    CMET: Lab Results  Component Value Date   WBC 8.5 04/24/2022   HGB 6.5 (LL) 04/24/2022   HCT 19.1 (L) 04/24/2022   PLT 81 (L) 04/24/2022   GLUCOSE 130 (H) 04/24/2022   ALT 33 04/22/2022   AST 33 04/22/2022   NA 134 (L) 04/24/2022   K 4.1 04/24/2022   CL 105 04/24/2022   CREATININE 0.62 04/24/2022   BUN 16 04/24/2022   CO2 23 04/24/2022   TSH 2.125 12/26/2021   INR 1.6 (H) 04/23/2022      PT/INR:  Recent Labs    04/23/22 1327  LABPROT 18.8*  INR 1.6*   Radiology: DG Chest Port 1 View  Result Date: 04/23/2022 CLINICAL DATA:  Status post tricuspid valve repair EXAM: PORTABLE CHEST 1 VIEW COMPARISON:  Radiograph 04/09/2022 FINDINGS: Endotracheal tube tip overlies the trachea at the level of the clavicles, approximately 5.8 cm above the carina. Postsurgical changes of median sternotomy and tricuspid valve repair. There are mediastinal drains and a right sided thoracostomy tube in place. Right neck approach catheter in sheath in place with tip overlying the proximal SVC. Orogastric tube tip and side port overlie the stomach. Epicardial pacing wires are noted. Cardiac silhouette is unchanged in size. There is slight widening of the mediastinum in comparison to preoperative radiograph likely related to radiographic technique and postsurgical change. There are small bilateral pleural effusions. There are patchy airspace opacities bilaterally. IMPRESSION: Postoperative chest with small bilateral pleural effusions and Support apparatus as described above. Patchy airspace opacities bilaterally, recommend continued radiographic follow-up. Electronically Signed   By: Maurine Simmering M.D.   On: 04/23/2022 13:38     Assessment/Plan: S/P Procedure(s) (LRB): TRICUSPID VALVE REPAIR WITH 28MM MC3 TRICUSPID ANNULOPLASTY RING (N/A) TRANSESOPHAGEAL ECHOCARDIOGRAM (TEE) (N/A) POD#1  1 Tmax 101.7,febrile- ABX as per ID , good CI, SVR is low, on levo, wean as able 2 O2 sats good on 2 liters 3 excellent UOP 4 CT 860 cc 5 normal renal fxn 6 no leukocytosis 7 H/H 6.5/19.1- to receive transfusion, expected ABLA 8 thrombocytopenia- monitor  9 CXR- some basilar ASD, small effus 10 BS well controlled 11 routine pulm hygiene and rehab    John Giovanni PA-C Pager [phone] 04/24/2022 7:32 AM   Agree with  above Will transfuse 2 units Weaning pressors, will remove A line once off Will keep pacing today Will keep chest tubes  Rhina Kramme O Isador [place]

## 2022-04-24 NOTE — TOC Progression Note (Signed)
Transition of Care [facility]) - Progression Note    Patient Details  Name: Reginald Clarke MRN: [id number] Date of Birth: 12/11/1990  Transition of Care [facility]) CM/SW Contact  Graves-Bigelow, Ocie Cornfield, RN Phone Number: 04/24/2022, 3:32 PM  Clinical Narrative: Patient transferred from 5-W. POD-1 Tricuspid Valve Repair- CT's in place. Case Manager will continue to follow for transition of care needs.   Expected Discharge Plan: Home/Self Care Barriers to Discharge: Continued Medical Work up  Expected Discharge Plan and Services Expected Discharge Plan: Home/Self Care   Discharge Planning Services: CM Consult   Living arrangements for the past 2 months: Single Family Home                 DME Arranged: N/A         HH Arranged: NA     Readmission Risk Interventions    02/26/2022   10:07 AM  Readmission Risk Prevention Plan  Transportation Screening Complete  PCP or Specialist Appt within 3-5 Days Complete  HRI or Home Care Consult Complete  Palliative Care Screening Not Applicable

## 2022-04-24 NOTE — Progress Notes (Signed)
[facility] for Infectious Disease    Date of Admission:  02/25/2022     ID: Reginald Clarke is a 31 y.o. 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 Principal Problem:   Endocarditis of tricuspid valve Active Problems:   MRSA bacteremia   Chronic pain syndrome/methadone abuse    Tobacco abuse   Polysubstance abuse ([name])   Hepatitis C, chronic ([name])   Anxiety   Normocytic anemia   Prolonged QT interval   Sepsis ([name])   Elevated serum creatinine   Hypokalemia   Hypomagnesemia   Hyponatremia   Subacute bacterial endocarditis   MSSA bacteremia   IVDU (intravenous drug user)   Pneumonia of both lower lobes due to methicillin resistant Staphylococcus aureus (MRSA) ([place])   Thrombocytopenia ([place])   Encounter for preoperative dental examination   [place]   Retained tooth root   Chronic apical periodontitis   Chronic periodontitis   Accretions on teeth   Impacted third molar tooth   Teeth missing   Phobia of dental procedure   Shock ([place])   Acute respiratory failure ([place])    Subjective: Feels good since having surgery, not significant chest pain.  916mL serosanginous fluid in chest tube collection system.  Valve tissue showing staph aureus (sensi pending)  Medications:   sodium chloride   Intravenous Once   acetaminophen  1,000 mg Oral Q6H   Or   acetaminophen (TYLENOL) oral liquid 160 mg/5 mL  1,000 mg Per Tube Q6H   aspirin EC  325 mg Oral Daily   Or   aspirin  324 mg Per Tube Daily   bisacodyl  10 mg Oral Daily   Or   bisacodyl  10 mg Rectal Daily   Chlorhexidine Gluconate Cloth  6 each Topical Q0600   docusate sodium  100 mg Oral BID   [START ON 04/25/2022] enoxaparin (LOVENOX) injection  40 mg Subcutaneous QHS   escitalopram  10 mg Oral Daily   feeding supplement  237 mL Oral BID BM   gabapentin  300 mg Oral TID   insulin aspart  0-24 Units Subcutaneous Q4H   methadone  135 mg Oral Daily   metoprolol tartrate  12.5 mg Oral BID   Or    metoprolol tartrate  12.5 mg Per Tube BID   multivitamin with minerals  1 tablet Oral Daily   nicotine  14 mg Transdermal Daily   pantoprazole  40 mg Oral Daily   sodium chloride flush  3 mL Intravenous Q12H   warfarin  2.5 mg Oral q1600   Warfarin - Physician Dosing Inpatient   Does not apply q1600    Objective: Vital signs in last 24 hours: Temp:  [97.5 F (36.4 C)-101.7 F (38.7 C)] 99.1 F (37.3 C) (09/20 1400) Pulse Rate:  [79-110] 83 (09/20 1500) Resp:  [13-39] 21 (09/20 1500) BP: (93-146)/(43-96) 112/96 (09/20 1500) SpO2:  [91 %-100 %] 97 % (09/20 1500) Arterial Line BP: (102-165)/(36-66) 145/53 (09/20 1000) FiO2 (%):  [50 %] 50 % (09/19 1528) Weight:  [80.2 kg] 80.2 kg (09/20 0500)  Physical Exam  Constitutional: He is oriented to person, place, and time. He appears well-developed and well-nourished. No distress.  HENT:  Mouth/Throat: Oropharynx is clear and moist. No oropharyngeal exudate.  Cardiovascular: Normal rate, regular rhythm and normal heart sounds. Exam reveals no gallop and no friction rub.  No murmur heard. Chest tube nad pacer wires in place Pulmonary/Chest: Effort normal and breath sounds normal. No respiratory distress.  He has no wheezes.  Abdominal: Soft. Bowel sounds are normal. He exhibits no distension. There is no tenderness.  Lymphadenopathy:  He has no cervical adenopathy.  Neurological: He is alert and oriented to person, place, and time.  Skin: Skin is warm and dry. No rash noted. No erythema.  Psychiatric: He has a normal mood and affect. His behavior is normal.    Lab Results Recent Labs    04/23/22 1956 04/24/22 0408  WBC 12.2* 8.5  HGB 7.7* 6.5*  HCT 22.8* 19.1*  NA 135 134*  K 4.8 4.1  CL 111 105  CO2 21* 23  BUN 16 16  CREATININE 0.64 0.62   Liver Panel Recent Labs    04/22/22 0407  PROT 7.7  ALBUMIN 3.2*  AST 33  ALT 33  ALKPHOS 75  BILITOT 0.5     Microbiology: reviewed Studies/Results: ECHO INTRAOPERATIVE  TEE  Result Date: 04/24/2022  *INTRAOPERATIVE TRANSESOPHAGEAL REPORT *  Patient Name:   Reginald Clarke Date of Exam: 04/23/2022 Medical Rec #:  [id number]    Height:       70.0 in Accession #:    [id number]   Weight:       175.0 lb Date of Birth:  [birth year]    BSA:          1.97 m Patient Age:    31 years     BP:           106/63 mmHg Patient Gender: M            HR:           105 bpm. Exam Location:  Inpatient Transesophogeal exam was perform intraoperatively during surgical procedure. Patient was closely monitored under general anesthesia during the entirety of examination. Indications:     Tricuspid endocarditis Sonographer:     Sheralyn Boatman RDCS Performing Phys: [id number] Eliezer Lofts LIGHTFOOT Diagnosing Phys: Arrie Aran MD Complications: No known complications during this procedure. POST-OP IMPRESSIONS _ Left Ventricle: The left ventricle is unchanged from pre-bypass. has normal systolic function, with an ejection fraction of 60%. _ Right Ventricle: The right ventricle appears unchanged from pre-bypass. _ Aorta: The aorta appears unchanged from pre-bypass. _ Left Atrial Appendage: The left atrial appendage appears unchanged from pre-bypass. _ Aortic Valve: The aortic valve appears unchanged from pre-bypass. _ Mitral Valve: The mitral valve appears unchanged from pre-bypass. There is trivial regurgitation. _ Tricuspid Valve: There is no regurgitation.No TV regurgitation s/p Tricuspid valve annuloplasty with 28 mm MC 3 ring. _ Pulmonic Valve: The pulmonic valve appears unchanged from pre-bypass. _ Interatrial Septum: The interatrial septum appears unchanged from pre-bypass. _ Comments: Post-bypass images reviewed with surgeon. PRE-OP FINDINGS  Left Ventricle: The left ventricle has normal systolic function, with an ejection fraction of 60-65%. The cavity size was normal. There is no left ventricular hypertrophy. Right Ventricle: The right ventricle has normal systolic function. The cavity was normal. There is no  increase in right ventricular wall thickness. Left Atrium: Left atrial size was normal in size. No left atrial/left atrial appendage thrombus was detected. Left atrial appendage velocity is normal at greater than 40 cm/s. Right Atrium: Right atrial size was dilated. Interatrial Septum: No atrial level shunt detected by color flow Doppler. Pericardium: Trivial pericardial effusion is present. Mitral Valve: The mitral valve is normal in structure. Mitral valve regurgitation is trivial by color flow Doppler. Tricuspid Valve: The tricuspid valve was dilated in appearance. Tricuspid valve regurgitation is severe by color flow Doppler. The jet  is directed eccentrically. The tricuspid valve is status post repair with an annuloplasty ring. Severe flail of the the  tricuspid valve anterior cusp was present. There is a large pedunculated and mobile tricuspid valve vegetation present on the posterior cusp that measures 2.57cm mm x 1.37cm mm in size. Aortic Valve: The aortic valve is tricuspid Aortic valve regurgitation was not visualized by color flow Doppler. Pulmonic Valve: The pulmonic valve was normal in structure. Pulmonic valve regurgitation is not visualized by color flow Doppler. Aorta: The ascending aorta, aortic root and aortic arch are normal in size and structure.  Hoy Morn MD Electronically signed by Hoy Morn MD Signature Date/Time: 04/24/2022/9:26:40 AM    Final    DG Chest Port 1 View  Result Date: 04/24/2022 CLINICAL DATA:  Chest tube present S/P tricuspid valve repair, chest pain EXAM: PORTABLE CHEST 1 VIEW COMPARISON:  Chest x-ray 04/23/2022. FINDINGS: Layering bilateral pleural effusions. Overlying bibasilar opacities, greater on the left. Patchy airspace opacities. No visible pneumothorax on this semi erect radiograph. Cardiomediastinal silhouette is mildly enlarged. Median sternotomy. Mediastinal drains. Right chest tube. Right IJ approach central venous catheter with the tip projecting at the  mid SVC. IMPRESSION: No substantial change. Similar postoperative changes and lines/tubes with layering bilateral pleural effusions, probable overlying bibasilar atelectasis, and patchy airspace opacities Electronically Signed   By: Margaretha Sheffield M.D.   On: 04/24/2022 08:12   DG Chest Port 1 View  Result Date: 04/23/2022 CLINICAL DATA:  Status post tricuspid valve repair EXAM: PORTABLE CHEST 1 VIEW COMPARISON:  Radiograph 04/09/2022 FINDINGS: Endotracheal tube tip overlies the trachea at the level of the clavicles, approximately 5.8 cm above the carina. Postsurgical changes of median sternotomy and tricuspid valve repair. There are mediastinal drains and a right sided thoracostomy tube in place. Right neck approach catheter in sheath in place with tip overlying the proximal SVC. Orogastric tube tip and side port overlie the stomach. Epicardial pacing wires are noted. Cardiac silhouette is unchanged in size. There is slight widening of the mediastinum in comparison to preoperative radiograph likely related to radiographic technique and postsurgical change. There are small bilateral pleural effusions. There are patchy airspace opacities bilaterally. IMPRESSION: Postoperative chest with small bilateral pleural effusions and Support apparatus as described above. Patchy airspace opacities bilaterally, recommend continued radiographic follow-up. Electronically Signed   By: Maurine Simmering M.D.   On: 04/23/2022 13:38     Assessment/Plan: MRSA/MSSA TV endocarditis = restarting abtx clock using today as day 1. Continue with dapto/ceftaroline. Awaiting daptomycin sensitivites, hopefully to be back this week. For now planning 1-2 wk with dual therapy then de-escalate to mono therapy to finish out 6 wk course of treatment.   Post-op anemia= receiving blood transfusion   [facility] for Infectious Diseases Pager: [phone]  04/24/2022, 3:19 PM

## 2022-04-24 NOTE — Progress Notes (Addendum)
NAME:  Reginald Clarke, MRN:  [id number], DOB:  05-15-1991, LOS: 58 ADMISSION DATE:  02/25/2022, CONSULTATION DATE:  9/19 REFERRING MD:  Dr. Jerral Ralph, CHIEF COMPLAINT:  Tricuspid valve replacement   History of Present Illness:  Patient is a 31 yo male w/ pertinent PMH of recurrent MSSA native tricuspid valve endocarditis presents to [facility] 9/14 for elective tricuspid valve replacement.  On 4/12 patient hospitalized with MSSA bacteremia w/ native tricuspid valve endocarditis. Despite antibiotics patient still with recurrent positive MSSA blood cultures. Coming into [facility] on 9/14 for elective tricuspid valve replacement. ID following and has patient on ceftaroline/daptomycin. On 9/14 Dental medicine performed multiple extractions. On 9/19 patient received tricuspid valve replacement. Admitted to ICU post procedure. Post op intubated on mech vent and on 3 pressors. PCCM consulted.  Pertinent  Medical History   Past Medical History:  Diagnosis Date   Anxiety    Endocarditis    Hepatitis    hx of hep C per pt   Opiate dependence ([name])    on methadone     Significant Hospital Events: Including procedures, antibiotic start and stop dates in addition to other pertinent events   4/12-5/10>> hospitalization for MSSA bacteremia with native tricuspid valve endocarditis.  Underwent angio vac procedure on 4/26.Completed cefazolin on 5/10-planned weekly dalbavancin at the ID clinic.   5/22>> outpatient blood culture positive for MSSA 5/24-6/8>> admit to [facility] for persistent bacteremia-worsening back pain-discharge to [facility] on IV Ancef. 7/5>> discharged from [facility] on oral cefdinir.  Continued to have intermittent fever postdischarge.   7/24>> admit for-persistent fever/nausea/vomiting/diarrhea-blood cultures positive for MSSA. 9/5>> fever-repeat blood cultures growing Staph aureus supposed to finish IV antibiotic treatment 9/6 9/5 >> PICC line discontinued 9/14 >> admitted to [facility] for elective TVR; multiple  teeth extracted; on ceftaroline and dapto; id following 9/19>>s/p TVR; intubated post op; in shock on 3 pressors; transfused 1 unit for hgb 6.9; extubated 9/20>>on levo  Interim History / Subjective:  Extubated overnight Transufused 1 unit overnight  Hgb 6.5 this am; waiting to be transfused Tmax 101.7 F/WBC trending down 8.5 Patient on [place] States he feels good other than some pain with deep breathing and coughing On 2 mcg levo Flow track and chest tube in place  Objective   Blood pressure 114/61, pulse 95, temperature (!) 100.6 F (38.1 C), resp. rate (!) 30, height 5\' 10"  (1.778 m), weight 80.2 kg, SpO2 98 %.    Vent Mode: SIMV;PRVC FiO2 (%):  [50 %] 50 % Set Rate:  [12 bmp-24 bmp] 24 bmp Vt Set:  [580 mL] 580 mL PEEP:  [5 cmH20] 5 cmH20 Pressure Support:  [10 cmH20] 10 cmH20 Plateau Pressure:  [17 cmH20] 17 cmH20   Intake/Output Summary (Last 24 hours) at 04/24/2022 0743 Last data filed at 04/24/2022 0700 Gross per 24 hour  Intake 8640.38 ml  Output 4979 ml  Net 3661.38 ml    Filed Weights   04/17/22 0500 04/18/22 1424 04/24/22 0500  Weight: 80.9 kg 79.4 kg 80.2 kg    Examination: General:  NAD HEENT: MM pink/moist; multiple teeth extracted; Hoberg in place Neuro: Aox3; MAE CV: s1s2, RRR PULM:  dim clear BS bilaterally; [place] GI: soft, bsx4 active  Extremities: warm/dry, no edema  Skin: no rashes or lesions appreciated   Resolved Hospital Problem list     Assessment & Plan:  Shock: likely septic  ABLA: no active bleeding appreciated; transfused 1 unit 9.19; transfuse another unit 9.20 S/p TVR 9/19 Recurrent MSSA bacteremia w/ native tricuspid  valve endocarditis P: -TCTS following; appreciate recs -Continue abx (ceftaroline/daptomycin) and f/u cultures per ID -wbc trending down; continue to trend fever/wbc curve -telemetry monitoring -continue levo for map goal >65 -transfuse 1 unit PRBC; f/u H/H post transfusion -weaned off insulin gtt -CT management  per protocol -Pain management -Pulm toiletry: IS/OOB as tolerated  Post op mechanical ventilation P: -extubated yesterday -continue to wean [place] for sats >92% -pulm toiletry: IS -OOB as tolerated  Normocytic anemia Thrombocytopenia P: -transfuse 1 unit -trend cbc  Anxiety Methamphetamine/chronic methadone use P: -continue lexapro and hydroxyzine -continue methadone -continue to hold xanax for now  Chronic HCV P: -further f/u outpatient with ID  Mild transaminitis P: -trend cmp   Best Practice (right click and "Reselect all SmartList Selections" daily)   Diet/type: clear liquids DVT prophylaxis: LMWH GI prophylaxis: PPI Lines: Central line and Arterial Line Foley:  Yes, and it is still needed Code Status:  full code Last date of multidisciplinary goals of care discussion [9/20 girlfriend and patient updated at bedside]  Labs   CBC: Recent Labs  Lab 04/22/22 0407 04/23/22 0549 04/23/22 0808 04/23/22 1111 04/23/22 1112 04/23/22 1327 04/23/22 1442 04/23/22 1543 04/23/22 1755 04/23/22 1916 04/23/22 1956 04/24/22 0408  WBC 7.0 8.6  --   --   --  38.0*  --   --   --   --  12.2* 8.5  HGB 10.7* 10.4*   < > 7.4*   < > 8.6*   < > 6.5* 7.5* 7.5* 7.7* 6.5*  HCT 33.9* 33.4*   < > 22.6*   < > 26.5*   < > 19.0* 22.0* 22.0* 22.8* 19.1*  MCV 83.7 83.9  --   --   --  85.5  --   --   --   --  82.9 82.7  PLT 234 185  --  105*  --  179  --   --   --   --  103* 81*   < > = values in this interval not displayed.     Basic Metabolic Panel: Recent Labs  Lab 04/22/22 0407 04/23/22 0549 04/23/22 0808 04/23/22 1112 04/23/22 1116 04/23/22 1200 04/23/22 1203 04/23/22 1327 04/23/22 1442 04/23/22 1543 04/23/22 1755 04/23/22 1916 04/23/22 1956 04/24/22 0408  NA 139 134*   < > 137   < > 138   < > 138   < > 138 138 137 135 134*  K 4.8 4.3   < > 5.2*   < > 4.9   < > 5.0   < > 6.2* 5.3* 4.8 4.8 4.1  CL 100 98   < > 102  --  105  --  106  --   --   --   --  111 105  CO2  29 26  --   --   --   --   --  24  --   --   --   --  21* 23  GLUCOSE 98 119*   < > 211*  --  189*  --  156*  --   --   --   --  152* 130*  BUN 19 19   < > 18  --  16  --  17  --   --   --   --  16 16  CREATININE 0.90 0.87   < > 0.60*  --  0.50*  --  0.71  --   --   --   --  0.64 0.62  CALCIUM 9.9 9.9  --   --   --   --   --  8.6*  --   --   --   --  7.1* 8.0*  MG  --   --   --   --   --   --   --   --   --   --   --   --  2.5* 2.0   < > = values in this interval not displayed.    GFR: Estimated Creatinine Clearance: 138.1 mL/min (by C-G formula based on SCr of 0.62 mg/dL). Recent Labs  Lab 04/23/22 0549 04/23/22 1327 04/23/22 1956 04/24/22 0408  WBC 8.6 38.0* 12.2* 8.5     Liver Function Tests: Recent Labs  Lab 04/22/22 0407  AST 33  ALT 33  ALKPHOS 75  BILITOT 0.5  PROT 7.7  ALBUMIN 3.2*    No results for input(s): "LIPASE", "AMYLASE" in the last 168 hours. No results for input(s): "AMMONIA" in the last 168 hours.  ABG    Component Value Date/Time   PHART 7.372 04/23/2022 1916   PCO2ART 32.3 04/23/2022 1916   PO2ART 82 (L) 04/23/2022 1916   HCO3 18.7 (L) 04/23/2022 1916   TCO2 20 (L) 04/23/2022 1916   ACIDBASEDEF 6.0 (H) 04/23/2022 1916   O2SAT 96 04/23/2022 1916     Coagulation Profile: Recent Labs  Lab 04/22/22 0407 04/23/22 1327  INR 1.1 1.6*     Cardiac Enzymes: Recent Labs  Lab 04/18/22 0426  CKTOTAL 12*     HbA1C: No results found for: "HGBA1C"  CBG: Recent Labs  Lab 04/24/22 0310 04/24/22 0407 04/24/22 0508 04/24/22 0613 04/24/22 0658  GLUCAP 116* 134* 105* 102* 98     Review of Systems:   Patient is encephalopathic and/or intubated. Therefore history has been obtained from chart review.    Past Medical History:  He,  has a past medical history of Anxiety, Endocarditis, Hepatitis, and Opiate dependence ([name]).   Surgical History:   Past Surgical History:  Procedure Laterality Date   APPLICATION OF ANGIOVAC N/A  11/28/2021   Procedure: APPLICATION OF ANGIOVAC;  Surgeon: Corliss SkainsLightfoot, Harrell O, MD;  Location: [facility] OR;  Service: Vascular;  Laterality: N/A;   MULTIPLE EXTRACTIONS WITH ALVEOLOPLASTY N/A 04/18/2022   Procedure: MULTIPLE EXTRACTION WITH ALVEOLOPLASTY;  Surgeon: Sharman Cheekwsley, Madison B, DMD;  Location: [facility] OR;  Service: Dentistry;  Laterality: N/A;   TEE WITHOUT CARDIOVERSION N/A 11/23/2021   Procedure: TRANSESOPHAGEAL ECHOCARDIOGRAM (TEE);  Surgeon: Christell Constanthandrasekhar, Mahesh A, MD;  [facility];  Service: Cardiovascular;  Laterality: N/A;   TEE WITHOUT CARDIOVERSION N/A 11/28/2021   Procedure: TRANSESOPHAGEAL ECHOCARDIOGRAM (TEE);  Surgeon: Corliss SkainsLightfoot, Harrell O, MD;  [facility];  Service: Open Heart Surgery;  Laterality: N/A;   TEE WITHOUT CARDIOVERSION N/A 03/07/2022   Procedure: TRANSESOPHAGEAL ECHOCARDIOGRAM (TEE);  Surgeon: Christell Constanthandrasekhar, Mahesh A, MD;  [facility];  Service: Cardiovascular;  Laterality: N/A;   tubes in ears     WRIST FRACTURE SURGERY       Social History:   reports that he quit smoking about 4 months ago. His smoking use included cigarettes. He has a 19.00 pack-year smoking history. He has never used smokeless tobacco. He reports current drug use. Drugs: Methamphetamines, Marijuana, and Heroin. He reports that he does not drink alcohol.   Family History:  His family history is not on file.   Allergies No Known Allergies   Home Medications  Prior to Admission medications  Medication Sig Start Date End Date Taking? Authorizing Provider  acetaminophen (TYLENOL) 650 MG CR tablet Take 650 mg by mouth every 8 (eight) hours as needed for pain.   Yes [provider]  methadone (DOLOPHINE) 1 MG/1ML solution Take 135 mg by mouth daily. 04/24/20  Yes [provider]  methocarbamol (ROBAXIN) 500 MG tablet Take 1 tablet (500 mg total) by mouth every 8 (eight) hours as needed for muscle spasms. 12/12/21  Yes Osvaldo Shipper, MD  ondansetron (ZOFRAN) 4 MG  tablet Take 4 mg by mouth every 8 (eight) hours as needed. 02/06/22  Yes [provider]  pantoprazole (PROTONIX) 40 MG tablet Take 1 tablet (40 mg total) by mouth daily. 12/13/21  Yes Osvaldo Shipper, MD  ALPRAZolam Prudy Feeler) 0.25 MG tablet Take 1 tablet (0.25 mg total) by mouth 2 (two) times daily as needed for anxiety. Patient not taking: Reported on 02/25/2022 12/12/21   Osvaldo Shipper, MD  ibuprofen (ADVIL) 600 MG tablet Take 1 tablet (600 mg total) by mouth every 8 (eight) hours as needed for moderate pain. Patient not taking: Reported on 02/25/2022 01/10/22   Leroy Sea, MD     Critical care time: 35 minutes    JD Anselm Lis [facility] 04/24/2022, 7:43 AM  Please see [email or web address] for pager details.  From 7A-7P if no response, please call [phone]. After hours, please call ELink [phone].

## 2022-04-24 NOTE — Progress Notes (Signed)
Date and time results received: 04/24/22 0448   Test: Hemoglobin Critical Value: 6.5  Name of Provider Notified: Dr. Lavonna Monarch   Orders Received? Or Actions Taken?: Per Dr. Lavonna Monarch, continue to monitor and he will let Dr. Kipp Brood know of the result to see if blood will be ordered. RN continuing to monitor, no signs of active bleeding, patient asymptomatic.

## 2022-04-24 NOTE — Anesthesia Postprocedure Evaluation (Signed)
Anesthesia Post Note  Patient: Reginald Clarke  Procedure(s) Performed: TRICUSPID VALVE REPAIR WITH 28MM MC3 TRICUSPID ANNULOPLASTY RING (Chest) TRANSESOPHAGEAL ECHOCARDIOGRAM (TEE) (Esophagus)     Patient location during evaluation: SICU Anesthesia Type: General Level of consciousness: sedated Pain management: pain level controlled Vital Signs Assessment: post-procedure vital signs reviewed and stable Respiratory status: patient remains intubated per anesthesia plan Cardiovascular status: stable Postop Assessment: no apparent nausea or vomiting Anesthetic complications: no   No notable events documented.  Last Vitals:  Vitals:   04/24/22 0815 04/24/22 0830  BP:    Pulse: (!) 103 (!) 101  Resp: 20 (!) 25  Temp: 37.6 C 37.3 C  SpO2: 97% 100%    Last Pain:  Vitals:   04/24/22 0824  TempSrc:   PainSc: [place]

## 2022-04-25 ENCOUNTER — Inpatient Hospital Stay (HOSPITAL_COMMUNITY): Payer: BLUE CROSS/BLUE SHIELD

## 2022-04-25 DIAGNOSIS — D62 Acute posthemorrhagic anemia: Secondary | ICD-10-CM | POA: Diagnosis not present

## 2022-04-25 DIAGNOSIS — J9600 Acute respiratory failure, unspecified whether with hypoxia or hypercapnia: Secondary | ICD-10-CM | POA: Diagnosis not present

## 2022-04-25 DIAGNOSIS — I019 Acute rheumatic heart disease, unspecified: Secondary | ICD-10-CM | POA: Diagnosis not present

## 2022-04-25 DIAGNOSIS — J90 Pleural effusion, not elsewhere classified: Secondary | ICD-10-CM

## 2022-04-25 DIAGNOSIS — I079 Rheumatic tricuspid valve disease, unspecified: Secondary | ICD-10-CM | POA: Diagnosis not present

## 2022-04-25 DIAGNOSIS — R579 Shock, unspecified: Secondary | ICD-10-CM | POA: Diagnosis not present

## 2022-04-25 LAB — BPAM RBC
Blood Product Expiration Date: 202310152359
Blood Product Expiration Date: 202310152359
Blood Product Expiration Date: 202310202359
Blood Product Expiration Date: 202310202359
Blood Product Expiration Date: 202310202359
ISSUE DATE / TIME: 202309191023
ISSUE DATE / TIME: 202309191023
ISSUE DATE / TIME: 202309191609
ISSUE DATE / TIME: 202309200934
ISSUE DATE / TIME: 202309200934
Unit Type and Rh: 5100
Unit Type and Rh: 5100
Unit Type and Rh: 5100
Unit Type and Rh: 5100
Unit Type and Rh: 5100

## 2022-04-25 LAB — TYPE AND SCREEN
ABO/RH(D): O POS
Antibody Screen: NEGATIVE
Unit division: 0
Unit division: 0
Unit division: 0
Unit division: 0
Unit division: 0

## 2022-04-25 LAB — GLUCOSE, CAPILLARY
Glucose-Capillary: 95 mg/dL (ref 70–99)
Glucose-Capillary: 100 mg/dL — ABNORMAL HIGH (ref 70–99)
Glucose-Capillary: 71 mg/dL (ref 70–99)
Glucose-Capillary: 96 mg/dL (ref 70–99)
Glucose-Capillary: 116 mg/dL — ABNORMAL HIGH (ref 70–99)

## 2022-04-25 LAB — CBC
HCT: 22.9 % — ABNORMAL LOW (ref 39.0–52.0)
Hemoglobin: 7.6 g/dL — ABNORMAL LOW (ref 13.0–17.0)
MCH: 28.8 pg (ref 26.0–34.0)
MCHC: 33.2 g/dL (ref 30.0–36.0)
MCV: 86.7 fL (ref 80.0–100.0)
Platelets: 49 10*3/uL — ABNORMAL LOW (ref 150–400)
RBC: 2.64 MIL/uL — ABNORMAL LOW (ref 4.22–5.81)
RDW: 15.5 % (ref 11.5–15.5)
WBC: 4.6 10*3/uL (ref 4.0–10.5)
nRBC: 0 % (ref 0.0–0.2)

## 2022-04-25 LAB — COMPREHENSIVE METABOLIC PANEL
ALT: 31 U/L (ref 0–44)
AST: 34 U/L (ref 15–41)
Albumin: 2.6 g/dL — ABNORMAL LOW (ref 3.5–5.0)
Alkaline Phosphatase: 52 U/L (ref 38–126)
Anion gap: 5 (ref 5–15)
BUN: 18 mg/dL (ref 6–20)
CO2: 25 mmol/L (ref 22–32)
Calcium: 8.3 mg/dL — ABNORMAL LOW (ref 8.9–10.3)
Chloride: 106 mmol/L (ref 98–111)
Creatinine, Ser: 0.67 mg/dL (ref 0.61–1.24)
GFR, Estimated: >60 mL/min (ref >60)
Glucose, Bld: 96 mg/dL (ref 70–99)
Potassium: 4.1 mmol/L (ref 3.5–5.1)
Sodium: 136 mmol/L (ref 135–145)
Total Bilirubin: 0.3 mg/dL (ref 0.3–1.2)
Total Protein: 5.4 g/dL — ABNORMAL LOW (ref 6.5–8.1)

## 2022-04-25 LAB — MIC RESULTS (2 DRUGS)

## 2022-04-25 LAB — PROTIME-INR
INR: 1.3 — ABNORMAL HIGH (ref 0.8–1.2)
Prothrombin Time: 16.2 seconds — ABNORMAL HIGH (ref 11.4–15.2)

## 2022-04-25 LAB — MAGNESIUM: Magnesium: 2.2 mg/dL (ref 1.7–2.4)

## 2022-04-25 LAB — MIN INHIBITORY CONC (2 DRUGS): Source (MIC2): 88005

## 2022-04-25 LAB — CK: Total CK: 169 U/L (ref 49–397)

## 2022-04-25 MED ORDER — SODIUM CHLORIDE 0.9 % IV SOLN: 250.0000 mL | INTRAVENOUS | Status: DC | PRN

## 2022-04-25 MED ORDER — ~~LOC~~ CARDIAC SURGERY, PATIENT & FAMILY EDUCATION: Freq: Once | Status: AC

## 2022-04-25 MED ORDER — SODIUM CHLORIDE 0.9% FLUSH: 3.0000 mL | INTRAVENOUS | Status: DC | PRN

## 2022-04-25 MED ORDER — SODIUM CHLORIDE 0.9% FLUSH
3.0000 mL | Freq: Two times a day (BID) | INTRAVENOUS | Status: DC
  Administered 2022-04-25 – 2022-04-26 (×4): 3 mL via INTRAVENOUS

## 2022-04-25 MED ORDER — VANCOMYCIN HCL 1500 MG/300ML IV SOLN
1500.0000 mg | Freq: Once | INTRAVENOUS | Status: AC
  Administered 2022-04-25: 1500 mg via INTRAVENOUS
  Filled 2022-04-25: qty 300

## 2022-04-25 MED ORDER — VANCOMYCIN HCL 1250 MG/250ML IV SOLN
1250.0000 mg | Freq: Three times a day (TID) | INTRAVENOUS | Status: DC
  Administered 2022-04-25 – 2022-04-26 (×2): 1250 mg via INTRAVENOUS
  Filled 2022-04-25 (×4): qty 250

## 2022-04-25 MED FILL — Sodium Bicarbonate IV Soln 8.4%: INTRAVENOUS | Qty: 50 | Status: AC

## 2022-04-25 MED FILL — Sodium Chloride IV Soln 0.9%: INTRAVENOUS | Qty: 3000 | Status: AC

## 2022-04-25 MED FILL — Potassium Chloride Inj 2 mEq/ML: INTRAVENOUS | Qty: 20 | Status: AC

## 2022-04-25 MED FILL — Heparin Sodium (Porcine) Inj 1000 Unit/ML: INTRAMUSCULAR | Qty: 20 | Status: AC

## 2022-04-25 MED FILL — Lidocaine HCl Local Preservative Free (PF) Inj 2%: INTRAMUSCULAR | Qty: 14 | Status: AC

## 2022-04-25 MED FILL — Electrolyte-R (PH 7.4) Solution: INTRAVENOUS | Qty: 3000 | Status: AC

## 2022-04-25 MED FILL — Calcium Chloride Inj 10%: INTRAVENOUS | Qty: 10 | Status: AC

## 2022-04-25 NOTE — Progress Notes (Signed)
Pt declined ambulation due to feeling tired, spent time introducing myself, role, sternal precautions and importance of IS use.   1300-1320  Reginald Clarke 04/25/2022 1:22 PM

## 2022-04-25 NOTE — Progress Notes (Signed)
[address]       [place] [address]             [phone]                 2 Days Post-Op Procedure(s) (LRB): TRICUSPID VALVE REPAIR WITH 28MM MC3 TRICUSPID ANNULOPLASTY RING (N/A) TRANSESOPHAGEAL ECHOCARDIOGRAM (TEE) (N/A)   Events: No events _______________________________________________________________ Vitals: BP 107/60   Pulse 73   Temp 98.8 F (37.1 C) (Oral)   Resp 18   Ht 5\' 10"  (1.778 m)   Wt 82.8 kg   SpO2 97%   BMI 26.19 kg/m  Filed Weights   04/18/22 1424 04/24/22 0500 04/25/22 0500  Weight: 79.4 kg 80.2 kg 82.8 kg     - Neuro: alert NAD  - Cardiovascular: sinus  Drips: none.      - Pulm: EWOB    ABG    Component Value Date/Time   PHART 7.372 04/23/2022 1916   PCO2ART 32.3 04/23/2022 1916   PO2ART 82 (L) 04/23/2022 1916   HCO3 18.7 (L) 04/23/2022 1916   TCO2 20 (L) 04/23/2022 1916   ACIDBASEDEF 6.0 (H) 04/23/2022 1916   O2SAT 96 04/23/2022 1916    - Abd: ND - Extremity: warm  .Intake/Output      09/20 0701 09/21 0700 09/21 0701 09/22 0700   P.O. 480    I.V. (mL/kg) 883.1 (10.7)    Blood 600    IV Piggyback 462.8    Total Intake(mL/kg) 2425.9 (29.3)    Urine (mL/kg/hr) 1630 (0.8)    Blood     Chest Tube 170    Total Output 1800    Net +625.9            _______________________________________________________________ Labs:    Latest Ref Rng & Units 04/25/2022    4:17 AM 04/24/2022    5:14 PM 04/24/2022    4:08 AM  CBC  WBC 4.0 - 10.5 K/uL 4.6  6.4  8.5   Hemoglobin 13.0 - 17.0 g/dL 7.6  8.4  6.5   Hematocrit 39.0 - 52.0 % 22.9  24.5  19.1   Platelets 150 - 400 K/uL 49  54  81       Latest Ref Rng & Units 04/25/2022    4:17 AM 04/24/2022    5:14 PM 04/24/2022    4:08 AM  CMP  Glucose 70 - 99 mg/dL 96  105  130   BUN 6 - 20 mg/dL 18  18  16    Creatinine 0.61 - 1.24 mg/dL 0.67  0.82  0.62   Sodium 135 - 145 mmol/L 136  137  134   Potassium 3.5 - 5.1 mmol/L 4.1  4.0  4.1   Chloride 98 - 111  mmol/L 106  105  105   CO2 22 - 32 mmol/L 25  29  23    Calcium 8.9 - 10.3 mg/dL 8.3  8.3  8.0   Total Protein 6.5 - 8.1 g/dL 5.4     Total Bilirubin 0.3 - 1.2 mg/dL 0.3     Alkaline Phos 38 - 126 U/L 52     AST 15 - 41 U/L 34     ALT 0 - 44 U/L 31       CXR: Stable  _______________________________________________________________  Assessment and Plan: POD 2 s/p TV reconstruction  Neuro: pain controlled CV: will remove wires.  On coumadin Pulm: will remove CTs Renal: creat stable GI: on diet Heme: low plts,  will follow ID: on abx per ID Endo: SSI Dispo: floor   Corliss Skains 04/25/2022 9:19 AM

## 2022-04-25 NOTE — Progress Notes (Signed)
NAME:  Reginald Clarke, MRN:  [id number], DOB:  1991/03/17, LOS: 59 ADMISSION DATE:  02/25/2022, CONSULTATION DATE:  9/19 REFERRING MD:  Dr. Jerral Ralph, CHIEF COMPLAINT:  Tricuspid valve replacement   History of Present Illness:  Patient is a 31 yo male w/ pertinent PMH of recurrent MSSA native tricuspid valve endocarditis presents to [facility] 9/14 for elective tricuspid valve replacement.  On 4/12 patient hospitalized with MSSA bacteremia w/ native tricuspid valve endocarditis. Despite antibiotics patient still with recurrent positive MSSA blood cultures. Coming into [facility] on 9/14 for elective tricuspid valve replacement. ID following and has patient on ceftaroline/daptomycin. On 9/14 Dental medicine performed multiple extractions. On 9/19 patient received tricuspid valve replacement. Admitted to ICU post procedure. Post op intubated on mech vent and on 3 pressors. PCCM consulted.  Pertinent  Medical History   Past Medical History:  Diagnosis Date   Anxiety    Endocarditis    Hepatitis    hx of hep C per pt   Opiate dependence ([name])    on methadone     Significant Hospital Events: Including procedures, antibiotic start and stop dates in addition to other pertinent events   4/12-5/10>> hospitalization for MSSA bacteremia with native tricuspid valve endocarditis.  Underwent angio vac procedure on 4/26.Completed cefazolin on 5/10-planned weekly dalbavancin at the ID clinic.   5/22>> outpatient blood culture positive for MSSA 5/24-6/8>> admit to [facility] for persistent bacteremia-worsening back pain-discharge to [facility] on IV Ancef. 7/5>> discharged from [facility] on oral cefdinir.  Continued to have intermittent fever postdischarge.   7/24>> admit for-persistent fever/nausea/vomiting/diarrhea-blood cultures positive for MSSA. 9/5>> fever-repeat blood cultures growing Staph aureus supposed to finish IV antibiotic treatment 9/6 9/5 >> PICC line discontinued 9/14 >> admitted to [facility] for elective TVR; multiple  teeth extracted; on ceftaroline and dapto; id following 9/19>>s/p TVR; intubated post op; in shock on 3 pressors; transfused 1 unit for hgb 6.9; extubated 9/20>>on levo . 1 PRBC for hgb 6.8  9/21 off pressors. Plan to transfer out of unit   Interim History / Subjective:  Hgb drift from 8,4 to 7.6  Plt 49 Small Incr size R pleural effusion  Objective   Blood pressure (!) 109/55, pulse 84, temperature 98.8 F (37.1 C), temperature source Oral, resp. rate 18, height 5\' 10"  (1.778 m), weight 82.8 kg, SpO2 100 %.    FiO2 (%):  [28 %] 28 %   Intake/Output Summary (Last 24 hours) at 04/25/2022 1121 Last data filed at 04/25/2022 0700 Gross per 24 hour  Intake 2302.95 ml  Output 1590 ml  Net 712.95 ml   Filed Weights   04/18/22 1424 04/24/22 0500 04/25/22 0500  Weight: 79.4 kg 80.2 kg 82.8 kg    Examination: General:  chronically ill young adult M NAD  HEENT: NCAT. Multiple extracted teeth  Neuro: AAOx3 CV: mediastinal drains, pacing wires  PULM: CTAb  GI: soft ndnt  Extremities: no acute joint deformity  Skin: c/d/w   Resolved Hospital Problem list    Expected post op MV support  Shock   Assessment & Plan:    Recurrent MRSA/MSSA bacteremia, native tricuspid valve endocarditis S/p TVR 9/19  P: -chest tube and pacing wire removal per CVTS -Continue ceftaroline and dapto per ID  -multimodal analgesia   Bilateral pleural effusion, R>L  P -wean off O2  -IS, pulm hygiene, mobility   Expected post op anemia + anemia of critical illness  Thrombocytopenia  1 PRBC 9/19, 1 PRBC 9/20  Plt down from 103> 81> 54> 49  P -trend CBC   Substance use disorder Anxiety / depression  Tobacco use disorder P: -cont atarax and lexapro -nicotine   Chronic HCV P: -further f/u outpatient with ID -PRN LFTs    Dispo: plan to transfer out of ICU 9/21. PCCM will sign off at that time. Please re-engage if we can be of further assistance or if pt clinical status changes.    Best Practice (right click and "Reselect all SmartList Selections" daily)   Diet/type: Regular consistency (see orders) DVT prophylaxis: LMWH GI prophylaxis: PPI Lines: Central line Foley:  N/A Code Status:  full code Last date of multidisciplinary goals of care discussion [9/21 girlfriend and patient updated at bedside]  Labs   CBC: Recent Labs  Lab 04/23/22 1327 04/23/22 1442 04/23/22 1916 04/23/22 1956 04/24/22 0408 04/24/22 1714 04/25/22 0417  WBC 38.0*  --   --  12.2* 8.5 6.4 4.6  HGB 8.6*   < > 7.5* 7.7* 6.5* 8.4* 7.6*  HCT 26.5*   < > 22.0* 22.8* 19.1* 24.5* 22.9*  MCV 85.5  --   --  82.9 82.7 86.3 86.7  PLT 179  --   --  103* 81* 54* 49*   < > = values in this interval not displayed.    Basic Metabolic Panel: Recent Labs  Lab 04/23/22 1327 04/23/22 1442 04/23/22 1916 04/23/22 1956 04/24/22 0408 04/24/22 1714 04/25/22 0417  NA 138   < > 137 135 134* 137 136  K 5.0   < > 4.8 4.8 4.1 4.0 4.1  CL 106  --   --  111 105 105 106  CO2 24  --   --  21* 23 29 25   GLUCOSE 156*  --   --  152* 130* 105* 96  BUN 17  --   --  16 16 18 18   CREATININE 0.71  --   --  0.64 0.62 0.82 0.67  CALCIUM 8.6*  --   --  7.1* 8.0* 8.3* 8.3*  MG  --   --   --  2.5* 2.0 2.2 2.2   < > = values in this interval not displayed.   GFR: Estimated Creatinine Clearance: 138.1 mL/min (by C-G formula based on SCr of 0.67 mg/dL). Recent Labs  Lab 04/23/22 1956 04/24/22 0408 04/24/22 1714 04/25/22 0417  WBC 12.2* 8.5 6.4 4.6    Liver Function Tests: Recent Labs  Lab 04/22/22 0407 04/25/22 0417  AST 33 34  ALT 33 31  ALKPHOS 75 52  BILITOT 0.5 0.3  PROT 7.7 5.4*  ALBUMIN 3.2* 2.6*   No results for input(s): "LIPASE", "AMYLASE" in the last 168 hours. No results for input(s): "AMMONIA" in the last 168 hours.  ABG    Component Value Date/Time   PHART 7.372 04/23/2022 1916   PCO2ART 32.3 04/23/2022 1916   PO2ART 82 (L) 04/23/2022 1916   HCO3 18.7 (L) 04/23/2022 1916    TCO2 20 (L) 04/23/2022 1916   ACIDBASEDEF 6.0 (H) 04/23/2022 1916   O2SAT 96 04/23/2022 1916     Coagulation Profile: Recent Labs  Lab 04/22/22 0407 04/23/22 1327 04/25/22 0417  INR 1.1 1.6* 1.3*    Cardiac Enzymes: Recent Labs  Lab 04/25/22 0417  CKTOTAL 169    HbA1C: No results found for: "HGBA1C"  CBG: Recent Labs  Lab 04/24/22 1603 04/24/22 2005 04/24/22 2326 04/25/22 0404 04/25/22 0819  GLUCAP 103* 77 105* 95 100*    CRITICAL CARE Performed by: 04/27/22  Greydon Betke MSN, AGACNP-BC [place] for pager 04/25/2022, 11:21 AM

## 2022-04-25 NOTE — Progress Notes (Signed)
[facility]    Date of Admission:  02/25/2022   Total days of antibiotics           ID: Reginald Clarke is a 31 y.o. male with   Principal Problem:   Endocarditis of tricuspid valve Active Problems:   MRSA bacteremia   Chronic pain syndrome/methadone abuse    Tobacco abuse   Substance use disorder   Hepatitis C, chronic ([name])   Anxiety   Normocytic anemia   Prolonged QT interval   Sepsis ([name])   Elevated serum creatinine   Hypokalemia   Hypomagnesemia   Hyponatremia   Acute bacterial endocarditis   MSSA bacteremia   IVDU (intravenous drug user)   Pneumonia of both lower lobes due to methicillin resistant Staphylococcus aureus (MRSA) ([place])   Thrombocytopenia ([place])   Encounter for preoperative dental examination   Caries   Retained tooth root   Chronic apical periodontitis   Chronic periodontitis   Accretions on teeth   Impacted third molar tooth   Teeth missing   Phobia of dental procedure   Shock ([place])   Acute respiratory failure ([name])   Pleural effusion    Subjective: No longer in the icu; feeling steadily better. Had some localized pain with chest tube removal  Medications:   sodium chloride   Intravenous Once   acetaminophen  1,000 mg Oral Q6H   Or   acetaminophen (TYLENOL) oral liquid 160 mg/5 mL  1,000 mg Per Tube Q6H   aspirin EC  325 mg Oral Daily   Or   aspirin  324 mg Per Tube Daily   bisacodyl  10 mg Oral Daily   Or   bisacodyl  10 mg Rectal Daily   Chlorhexidine Gluconate Cloth  6 each Topical Q0600   docusate sodium  100 mg Oral BID   enoxaparin (LOVENOX) injection  40 mg Subcutaneous QHS   escitalopram  10 mg Oral Daily   feeding supplement  237 mL Oral BID BM   gabapentin  300 mg Oral TID   insulin aspart  0-24 Units Subcutaneous Q4H   methadone  135 mg Oral Daily   metoprolol tartrate  12.5 mg Oral BID   Or   metoprolol tartrate  12.5 mg Per Tube BID   multivitamin with minerals  1 tablet Oral Daily    nicotine  14 mg Transdermal Daily   pantoprazole  40 mg Oral Daily   sodium chloride flush  3 mL Intravenous Q12H   sodium chloride flush  3 mL Intravenous Q12H   warfarin  2.5 mg Oral q1600   Warfarin - Physician Dosing Inpatient   Does not apply q1600    Objective: Vital signs in last 24 hours: Temp:  [98.3 F (36.8 C)-99.1 F (37.3 C)] 98.4 F (36.9 C) (09/21 1140) Pulse Rate:  [71-102] 84 (09/21 1022) Resp:  [13-37] 18 (09/21 0800) BP: (78-125)/(43-96) 107/77 (09/21 1140) SpO2:  [92 %-100 %] 98 % (09/21 1140) FiO2 (%):  [28 %] 28 % (09/21 0839) Weight:  [82.8 kg] 82.8 kg (09/21 0500) Physical Exam  Constitutional: He is oriented to person, place, and time. He appears well-developed and well-nourished. No distress.  HENT:  Mouth/Throat: Oropharynx is clear and moist. No oropharyngeal exudate.  Cardiovascular: Normal rate, regular rhythm and normal heart sounds. Exam reveals no gallop and no friction rub.  No murmur heard.  Chest wall ; bandages in place from sternotomy Pulmonary/Chest: Effort normal and breath sounds normal. No respiratory distress.  He has no wheezes.  Abdominal: Soft. Bowel sounds are normal. He exhibits no distension. There is no tenderness.  Lymphadenopathy:  He has no cervical adenopathy.  Neurological: He is alert and oriented to person, place, and time.  Skin: Skin is warm and dry. No rash noted. No erythema.  Psychiatric: He has a normal mood and affect. His behavior is normal.    Lab Results Recent Labs    04/24/22 1714 04/25/22 0417  WBC 6.4 4.6  HGB 8.4* 7.6*  HCT 24.5* 22.9*  NA 137 136  K 4.0 4.1  CL 105 106  CO2 29 25  BUN 18 18  CREATININE 0.82 0.67   Liver Panel Recent Labs    04/25/22 0417  PROT 5.4*  ALBUMIN 2.6*  AST 34  ALT 31  ALKPHOS 52  BILITOT 0.3   Sedimentation Rate No results for input(s): "ESRSEDRATE" in the last 72 hours. C-Reactive Protein No results for input(s): "CRP" in the last 72  hours.  Microbiology: +tissue cx showing staph aureus (Awaiting sensitivites) 9/4 blood MRSA  (vanco 2, ceftaroline < 1 but dapto > 1 RESISTANCE) Studies/Results: DG Chest Port 1 View  Result Date: 04/25/2022 CLINICAL DATA:  [id number]. Chest discomfort following tricuspid valve repair. EXAM: PORTABLE CHEST 1 VIEW COMPARISON:  CT chest yesterday at 5:14 a.m. FINDINGS: Right IJ line and introducer sheath both terminating in the upper SVC. No pneumothorax. Midline sternotomy sutures remain intact with tricuspid valve ring prosthesis again shown. There are 2 mediastinal drains and a right chest tube with no visible pneumothorax. There is cardiomegaly with mild perihilar vascular congestion without overt edema. Moderate left and small right layering pleural effusions and patchy consolidation or atelectasis in the left greater than right mid to lower lungs. The overall aeration is not significantly changed. No new abnormality. Stable mediastinum. IMPRESSION: No new or worsening abnormality is seen. Overall aeration is unchanged. Mild perihilar vascular prominence is also stable. Electronically Signed   By: Almira Bar M.D.   On: 04/25/2022 06:59   DG Chest Port 1 View  Result Date: 04/24/2022 CLINICAL DATA:  Chest tube present S/P tricuspid valve repair, chest pain EXAM: PORTABLE CHEST 1 VIEW COMPARISON:  Chest x-ray 04/23/2022. FINDINGS: Layering bilateral pleural effusions. Overlying bibasilar opacities, greater on the left. Patchy airspace opacities. No visible pneumothorax on this semi erect radiograph. Cardiomediastinal silhouette is mildly enlarged. Median sternotomy. Mediastinal drains. Right chest tube. Right IJ approach central venous catheter with the tip projecting at the mid SVC. IMPRESSION: No substantial change. Similar postoperative changes and lines/tubes with layering bilateral pleural effusions, probable overlying bibasilar atelectasis, and patchy airspace opacities Electronically Signed    By: Feliberto Harts M.D.   On: 04/24/2022 08:12   DG Chest Port 1 View  Result Date: 04/23/2022 CLINICAL DATA:  Status post tricuspid valve repair EXAM: PORTABLE CHEST 1 VIEW COMPARISON:  Radiograph 04/09/2022 FINDINGS: Endotracheal tube tip overlies the trachea at the level of the clavicles, approximately 5.8 cm above the carina. Postsurgical changes of median sternotomy and tricuspid valve repair. There are mediastinal drains and a right sided thoracostomy tube in place. Right neck approach catheter in sheath in place with tip overlying the proximal SVC. Orogastric tube tip and side port overlie the stomach. Epicardial pacing wires are noted. Cardiac silhouette is unchanged in size. There is slight widening of the mediastinum in comparison to preoperative radiograph likely related to radiographic technique and postsurgical change. There are small bilateral pleural effusions. There are patchy airspace opacities bilaterally. IMPRESSION:  Postoperative chest with small bilateral pleural effusions and Support apparatus as described above. Patchy airspace opacities bilaterally, recommend continued radiographic follow-up. Electronically Signed   By: Caprice Renshaw M.D.   On: 04/23/2022 13:38     Assessment/Plan: MRSA/MSSA TV endocarditis = plan to treat for 6 wk using 04/24/22, however his MRSA isolate from 9/4 is both R to daptomycin and has vanco MIC 2 (c/w vanco intermediate resistance). Ceftaroline still sensitive   - we will plan to retest both for ceftaroline and daptomycin off of the MRSA isolate from the tissue on 9/19.  - in the meantime, we will continue ceftaroline, stop daptomycin, re-start vancomycin until sensitivities return. - as long as peripheral ivs are stable with vancomycin ,we will hold off on picc line for now  [facility] for Infectious Diseases Pager: [phone]  04/25/2022, 12:18 PM

## 2022-04-25 NOTE — Hospital Course (Addendum)
History of the present illness:  Patient is a-year-old male with a long history of significant IV drug abuse.  He presented to the emergency room on 02/24/2022 with chief complaints of nausea vomiting and diarrhea.  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  Recent symptoms were consistent with recurrent bacteremia following prolonged IV antibiotics course.  Hospital course  He was admitted to the hospitalist for medical management and stabilization with consideration to proceed with an more stable with tricuspid valve replacement or repair.  Infectious disease was consulted to assist with antibiotic management.  He was initially placed back on cefazolin for presumed MSSA ongoing process.  Blood cultures were positive for Staph aureus.  CT scan showed multifocal airspace opacities with increasing cavitary nodules.  MRI of the thoracic spine showed no evidence of infection.  Most recent blood cultures dated 04/11/2022 were negative.  A TEE done on 03/07/2022 showed 3 vegetations on the tricuspid valve with severe TR.  EF was 60 to 65%.  Consult consultation was obtained and he required multiple teeth extraction.  CT surgery was reconsulted and Dr. Kipp Brood recommended proceeding with tricuspid valve replacement versus repair.  He was felt to be acceptable to proceed with surgery from a medical stabilization viewpoint.  Procedure: On 04/23/2022 he was taken to the operating room at which time he underwent the following procedure:  Debridement of tricuspid valve endocarditis Tricuspid valve reconstruction with autologous pericardium Tricuspid valve annuloplasty with 28 mm MC 3 ring    He tolerated the procedure well was taken to the surgical intensive care unit in stable condition.  Postoperative hospital course:  Patient was extubated  using standard post cardiac surgical protocols without difficulty.  He has maintained stable hemodynamics.  He is maintaining normal sinus rhythm.  He did have an expected acute blood loss anemia requiring transfusion.  He has a postoperative thrombocytopenia which is being monitored clinically.  All routine lines, monitors and drainage devices have been discontinued in the standard fashion.  Infectious disease continues to manage antibiotic therapy.  The antibiotic lock was restarted on 04/24/2022 with continuation of Dapto/Ceftaroline.  Current plan is 1 to 2 weeks of dual therapy and then de-escalate to monotherapy to finish out a 6-week course of treatment.  On postoperative day #2 he was transferred to the 4 E. telemetry unit for ongoing recovery.  He developed nausea due to ABX regimen.  Scheduled anti-emetics were ordered and patient obtained relief.  IV pain medication was discontinued.  He will continue to be weaned off narcotic medication and Methadone will be continued.

## 2022-04-25 NOTE — Progress Notes (Signed)
PHARMACY ANTIBIOTIC CONSULT NOTE   Reginald Clarke a 31 y.o. male with recurrent TV endocarditis- failed abx treatment and angiovac. Pt is s/p TV debridement and repair on 9/19 with TCTS. Patient initially had MSSA bacteremia/IE and subsequently developed MRSA bacteremia from a line infection. Patient was being covered with daptomycin as Vancomycin MIC was high at 2, and there was concern MRSA may have seeded his valve. Repeat cultures from 9/4 also were confirmed as MRSA, again with an elevated Vancomycin MIC. Ceftaroline and Daptomycin MIC testing was performed by [facility] on 9/4 for ceftaroline and daptomycin. The MRSA isolate was found to be susceptible to ceftaroline but non-susceptible to daptomycin. With elevated Vancomycin MICs and daptomycin non-susceptibility, patient will stop daptomycin treatment and be initiated on Vancomycin in combination with ceftaroline. Pharmacy has been consulted to dose Vancomycin.   Will target higher end of AUC range as Vancomycin is of intermediate susceptibility.   9/21: Scr 0.67, LA 1.5,  WBC 4.6   Plan: STOP Daptomycin  CONTINUE Ceftaroline 600 mg IV Q8H  GIVE Vancomycin 1,500 mg IV x1 (Wt used: 82.8 kg) THEN Vancomycin 1,250 mg IV Q8H (Scr used: 0.8, Vd used: 0.72, eAUC: 528) Monitor renal function, repeat ceftaroline/daptomycin MICs from valve culture, long-term antibiotic plan, f/u sensis from tissue culture (valve)   Allergies:  No Known Allergies  Filed Weights   04/18/22 1424 04/24/22 0500 04/25/22 0500  Weight: 79.4 kg (175 lb) 80.2 kg (176 lb 12.9 oz) 82.8 kg (182 lb 8.7 oz)       Latest Ref Rng & Units 04/25/2022    4:17 AM 04/24/2022    5:14 PM 04/24/2022    4:08 AM  CBC  WBC 4.0 - 10.5 K/uL 4.6  6.4  8.5   Hemoglobin 13.0 - 17.0 g/dL 7.6  8.4  6.5   Hematocrit 39.0 - 52.0 % 22.9  24.5  19.1   Platelets 150 - 400 K/uL 49  54  81     Antibiotics Given (last 72 hours)     Date/Time Action Medication Dose Rate   04/22/22 1507 New  Bag/Given   ceftaroline (TEFLARO) 600 mg in sodium chloride 0.9 % 100 mL IVPB 600 mg 100 mL/hr   04/22/22 2032 New Bag/Given   DAPTOmycin (CUBICIN) 650 mg in sodium chloride 0.9 % IVPB 650 mg 126 mL/hr   04/22/22 2140 New Bag/Given   ceftaroline (TEFLARO) 600 mg in sodium chloride 0.9 % 100 mL IVPB 600 mg 100 mL/hr   04/23/22 0702 Given   vancomycin (VANCOREADY) IVPB 1250 mg/250 mL 1,500 mg    04/23/22 0804 Given   ceFAZolin (ANCEF) IVPB 2g/100 mL premix 2 g    04/23/22 1147 Given   ceFAZolin (ANCEF) IVPB 2g/100 mL premix 2 g    04/23/22 1525 New Bag/Given   ceftaroline (TEFLARO) 600 mg in sodium chloride 0.9 % 100 mL IVPB 600 mg 100 mL/hr   04/23/22 1935 New Bag/Given   vancomycin (VANCOCIN) IVPB 1000 mg/200 mL premix 1,000 mg 200 mL/hr   04/23/22 2134 New Bag/Given   ceftaroline (TEFLARO) 600 mg in sodium chloride 0.9 % 100 mL IVPB 600 mg 100 mL/hr   04/23/22 2300 New Bag/Given   DAPTOmycin (CUBICIN) 650 mg in sodium chloride 0.9 % IVPB 650 mg 126 mL/hr   04/24/22 0555 New Bag/Given   ceftaroline (TEFLARO) 600 mg in sodium chloride 0.9 % 100 mL IVPB 600 mg 100 mL/hr   04/24/22 1406 New Bag/Given   ceftaroline (TEFLARO) 600 mg in sodium chloride  0.9 % 100 mL IVPB 600 mg 100 mL/hr   04/24/22 2052 New Bag/Given   DAPTOmycin (CUBICIN) 650 mg in sodium chloride 0.9 % IVPB 650 mg 126 mL/hr   04/24/22 2214 New Bag/Given   ceftaroline (TEFLARO) 600 mg in sodium chloride 0.9 % 100 mL IVPB 600 mg 100 mL/hr   04/25/22 0524 New Bag/Given   ceftaroline (TEFLARO) 600 mg in sodium chloride 0.9 % 100 mL IVPB 600 mg 100 mL/hr       Antimicrobials this admission: Dapto 7/26>>9/21 Cefazolin 7/26 >>8/5  Doxy PO 8/8>>8/15  Ceftaroline 9/8>> Vancomycin 9/21>>  (Recent) Microbiology results: 9/19 valve tissue cx: Abundant staph aureus  9/7 Bcx: negF  9/4 Bcx: 4/4 MRSA  7/28 Bcx: negF 7/27 Bcx: negF 7/23 Bcx: 2/2 staph aureus (mixed MSSA/MRSA)  9/4 ceftaroline MIC < 1 mcg/mL  (susceptible) 9/4 Daptomycin MIC >1 mcg/mL (non-susceptible)   Thank you for allowing pharmacy to be a part of this patient's care.  Jani Gravel, PharmD PGY-2 Infectious Diseases Resident  04/25/2022 12:24 PM

## 2022-04-26 DIAGNOSIS — J90 Pleural effusion, not elsewhere classified: Secondary | ICD-10-CM

## 2022-04-26 DIAGNOSIS — I330 Acute and subacute infective endocarditis: Secondary | ICD-10-CM | POA: Diagnosis not present

## 2022-04-26 DIAGNOSIS — F1990 Other psychoactive substance use, unspecified, uncomplicated: Secondary | ICD-10-CM | POA: Diagnosis not present

## 2022-04-26 LAB — BASIC METABOLIC PANEL
Anion gap: 10 (ref 5–15)
BUN: 16 mg/dL (ref 6–20)
CO2: 21 mmol/L — ABNORMAL LOW (ref 22–32)
Calcium: 8.4 mg/dL — ABNORMAL LOW (ref 8.9–10.3)
Chloride: 108 mmol/L (ref 98–111)
Creatinine, Ser: 0.76 mg/dL (ref 0.61–1.24)
GFR, Estimated: >60 mL/min (ref >60)
Glucose, Bld: 100 mg/dL — ABNORMAL HIGH (ref 70–99)
Potassium: 4.1 mmol/L (ref 3.5–5.1)
Sodium: 139 mmol/L (ref 135–145)

## 2022-04-26 LAB — GLUCOSE, CAPILLARY
Glucose-Capillary: 118 mg/dL — ABNORMAL HIGH (ref 70–99)
Glucose-Capillary: 127 mg/dL — ABNORMAL HIGH (ref 70–99)
Glucose-Capillary: 81 mg/dL (ref 70–99)
Glucose-Capillary: 82 mg/dL (ref 70–99)
Glucose-Capillary: 95 mg/dL (ref 70–99)
Glucose-Capillary: 97 mg/dL (ref 70–99)

## 2022-04-26 LAB — CBC
HCT: 24 % — ABNORMAL LOW (ref 39.0–52.0)
Hemoglobin: 7.9 g/dL — ABNORMAL LOW (ref 13.0–17.0)
MCH: 28.6 pg (ref 26.0–34.0)
MCHC: 32.9 g/dL (ref 30.0–36.0)
MCV: 87 fL (ref 80.0–100.0)
Platelets: 60 10*3/uL — ABNORMAL LOW (ref 150–400)
RBC: 2.76 MIL/uL — ABNORMAL LOW (ref 4.22–5.81)
RDW: 15.7 % — ABNORMAL HIGH (ref 11.5–15.5)
WBC: 5.3 10*3/uL (ref 4.0–10.5)
nRBC: 0 % (ref 0.0–0.2)

## 2022-04-26 LAB — ACID FAST SMEAR (AFB, MYCOBACTERIA): Acid Fast Smear: NEGATIVE

## 2022-04-26 LAB — PROTIME-INR
INR: 1.3 — ABNORMAL HIGH (ref 0.8–1.2)
Prothrombin Time: 15.9 seconds — ABNORMAL HIGH (ref 11.4–15.2)

## 2022-04-26 MED ORDER — FE FUMARATE-B12-VIT C-FA-IFC PO CAPS
1.0000 | ORAL_CAPSULE | Freq: Two times a day (BID) | ORAL | Status: DC
  Administered 2022-04-26 – 2022-04-28 (×5): 1 via ORAL
  Filled 2022-04-26 (×11): qty 1

## 2022-04-26 MED ORDER — ONDANSETRON HCL 4 MG PO TABS
4.0000 mg | ORAL_TABLET | Freq: Three times a day (TID) | ORAL | Status: AC
  Administered 2022-04-26 – 2022-04-27 (×5): 4 mg via ORAL
  Filled 2022-04-26 (×5): qty 1

## 2022-04-26 MED ORDER — SODIUM CHLORIDE 0.9 % IV SOLN
800.0000 mg | Freq: Every day | INTRAVENOUS | Status: DC
  Administered 2022-04-26 – 2022-05-02 (×6): 800 mg via INTRAVENOUS
  Filled 2022-04-26 (×8): qty 16

## 2022-04-26 NOTE — Progress Notes (Signed)
CARDIAC REHAB PHASE I   Pt resting in bed with c/o of nausea. He is hoping nausea medicine will help soon. Pt does not want to walk now due to nausea. Encouraged pt to get in 3 walks today and continue IS use frequently. Will return later today as time permits. Pt post op teaching including move in tub, sternal precautions, heart healthy diet, home needs at discharge, IS use, risk factors, restrictions, site care, and CRP2 reviewed. Will refer to AP for CRP2. Pt states he may need long term antibiotic therapy prior to discharge. All questions and concerns addressed. Will continue to follow.   [id number]  Vanessa Barbara, RN BSN 04/26/2022 10:35 AM

## 2022-04-26 NOTE — Progress Notes (Signed)
[facility]    Abtx: ceftaroline since 9/4 and vanco since 9/21 Date of Admission:  02/25/2022     ID: Reginald Clarke is a 31 y.o. male with  MSSA/MRSA TV endocarditis POD#3 s/p repair with tricupsid annuloplasty ring Principal Problem:   Endocarditis of tricuspid valve Active Problems:   MRSA bacteremia   Chronic pain syndrome/methadone abuse    Tobacco abuse   Substance use disorder   Hepatitis C, chronic ([name])   Anxiety   Normocytic anemia   Prolonged QT interval   Sepsis ([name])   Elevated serum creatinine   Hypokalemia   Hypomagnesemia   Hyponatremia   Acute bacterial endocarditis   MSSA bacteremia   IVDU (intravenous drug user)   Pneumonia of both lower lobes due to methicillin resistant Staphylococcus aureus (MRSA) ([name])   Thrombocytopenia ([name])   Encounter for preoperative dental examination   Caries   Retained tooth root   Chronic apical periodontitis   Chronic periodontitis   Accretions on teeth   Impacted third molar tooth   Teeth missing   Phobia of dental procedure   Shock ([name])   Acute respiratory failure ([name])   Pleural effusion    Subjective: Still having nausea this morning waking up. No emesis this morning. But feels 1/2 tab phenergan not helpful enough. No diarrhea  Medications:   sodium chloride   Intravenous Once   acetaminophen  1,000 mg Oral Q6H   Or   acetaminophen (TYLENOL) oral liquid 160 mg/5 mL  1,000 mg Per Tube Q6H   aspirin EC  325 mg Oral Daily   Or   aspirin  324 mg Per Tube Daily   bisacodyl  10 mg Oral Daily   Or   bisacodyl  10 mg Rectal Daily   Chlorhexidine Gluconate Cloth  6 each Topical Q0600   docusate sodium  100 mg Oral BID   enoxaparin (LOVENOX) injection  40 mg Subcutaneous QHS   escitalopram  10 mg Oral Daily   feeding supplement  237 mL Oral BID BM   ferrous fumarate-b12-vitamic C-folic acid  1 capsule Oral BID PC   gabapentin  300 mg Oral TID   insulin aspart  0-24 Units  Subcutaneous Q4H   methadone  135 mg Oral Daily   metoprolol tartrate  12.5 mg Oral BID   Or   metoprolol tartrate  12.5 mg Per Tube BID   multivitamin with minerals  1 tablet Oral Daily   nicotine  14 mg Transdermal Daily   ondansetron  4 mg Oral Q8H   pantoprazole  40 mg Oral Daily   sodium chloride flush  3 mL Intravenous Q12H   sodium chloride flush  3 mL Intravenous Q12H   warfarin  2.5 mg Oral q1600   Warfarin - Physician Dosing Inpatient   Does not apply q1600    Objective: Vital signs in last 24 hours: Temp:  [98.3 F (36.8 C)-99.1 F (37.3 C)] 98.5 F (36.9 C) (09/22 1300) Pulse Rate:  [75-98] 79 (09/22 1300) Resp:  [19-20] 19 (09/22 1300) BP: (112-128)/(65-72) 116/68 (09/22 1300) SpO2:  [96 %-98 %] 97 % (09/22 1300) Weight:  [86.3 kg] 86.3 kg (09/22 0427) Physical Exam  Constitutional: He is oriented to person, place, and time. He appears well-developed and well-nourished. No distress.  HENT:  Mouth/Throat: Oropharynx is clear and moist. No oropharyngeal exudate. edentulous Cardiovascular: Normal rate, regular rhythm and normal heart sounds. Exam reveals no gallop and no friction rub.  No  murmur heard. Incision dressing in place Pulmonary/Chest: Effort normal and breath sounds normal. No respiratory distress. He has no wheezes.  Abdominal: Soft. Bowel sounds are normal. He exhibits no distension. There is no tenderness.  Lymphadenopathy:  He has no cervical adenopathy.  Neurological: He is alert and oriented to person, place, and time.  Skin: Skin is warm and dry. No rash noted. No erythema.  Psychiatric: He has a normal mood and affect. His behavior is normal.    Lab Results Recent Labs    04/25/22 0417 04/26/22 0404  WBC 4.6 5.3  HGB 7.6* 7.9*  HCT 22.9* 24.0*  NA 136 139  K 4.1 4.1  CL 106 108  CO2 25 21*  BUN 18 16  CREATININE 0.67 0.76   Liver Panel Recent Labs    04/25/22 0417  PROT 5.4*  ALBUMIN 2.6*  AST 34  ALT 31  ALKPHOS 52   BILITOT 0.3   Sedimentation Rate No results for input(s): "ESRSEDRATE" in the last 72 hours. C-Reactive Protein No results for input(s): "CRP" in the last 72 hours.  Microbiology: 9/19 tissue - staph aureus but sensitivities still pending Methicillin resistant staphylococcus aureus      MIC    CIPROFLOXACIN <=0.5 SENSI... Sensitive    CLINDAMYCIN <=0.25 SENS... Sensitive    ERYTHROMYCIN >=8 RESISTANT  Resistant    GENTAMICIN <=0.5 SENSI... Sensitive    Inducible Clindamycin NEGATIVE  Sensitive    OXACILLIN RESISTANT  Resistant    RIFAMPIN <=0.5 SENSI... Sensitive    TETRACYCLINE <=1 SENSITIVE  Sensitive    TRIMETH/SULFA <=10 SENSIT... Sensitive    VANCOMYCIN 2 SENSITIVE  Sensitive   CEFTAROLINE    <=1 UG/ML = SUSCEPTIBLE  DAPTOMYCIN      >1 UG/ML = NON-SUSCEPTIBLE  Studies/Results: DG Chest Port 1 View  Result Date: 04/25/2022 CLINICAL DATA:  [id number]. Chest discomfort following tricuspid valve repair. EXAM: PORTABLE CHEST 1 VIEW COMPARISON:  CT chest yesterday at 5:14 a.m. FINDINGS: Right IJ line and introducer sheath both terminating in the upper SVC. No pneumothorax. Midline sternotomy sutures remain intact with tricuspid valve ring prosthesis again shown. There are 2 mediastinal drains and a right chest tube with no visible pneumothorax. There is cardiomegaly with mild perihilar vascular congestion without overt edema. Moderate left and small right layering pleural effusions and patchy consolidation or atelectasis in the left greater than right mid to lower lungs. The overall aeration is not significantly changed. No new abnormality. Stable mediastinum. IMPRESSION: No new or worsening abnormality is seen. Overall aeration is unchanged. Mild perihilar vascular prominence is also stable. Electronically Signed   By: Telford Nab M.D.   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 Dapto-ceftaroline dual therapystill works despite daptomycin MIC > 1- mechanism of action is that the ceftaroline overcomes the R mechanism for daptomycin. We will continue with both abtx for the time being. Plan to repeat sensitivities for daptomycin and ceftaroline. Likely to send out on Monday. Once patient is ready for acute rehab, we will place picc line at that time.   Nausea= will schedule zofran Q 8hr for 5 doses to see if it overcomes his current symptoms  Deconditioning = continue to work with pt/ot  [facility] for Infectious Diseases Pager: [phone]  04/26/2022, 2:34 PM

## 2022-04-26 NOTE — Progress Notes (Signed)
This chaplain attempted F/U spiritual care. The Pt. is sleeping. The chaplain will attempt a visit at another time.  Chaplain Sallyanne Kuster [phone]

## 2022-04-26 NOTE — Progress Notes (Addendum)
[address]       [place] [address]             [phone]      3 Days Post-Op Procedure(s) (LRB): TRICUSPID VALVE REPAIR WITH 28MM MC3 TRICUSPID ANNULOPLASTY RING (N/A) TRANSESOPHAGEAL ECHOCARDIOGRAM (TEE) (N/A) Subjective: Mild nausea  Objective: Vital signs in last 24 hours: Temp:  [98.4 F (36.9 C)-99.1 F (37.3 C)] 99.1 F (37.3 C) (09/22 0427) Pulse Rate:  [73-98] 90 (09/22 0427) Cardiac Rhythm: Normal sinus rhythm (09/21 1916) Resp:  [18-20] 20 (09/22 0427) BP: (107-128)/(55-77) 112/68 (09/22 0427) SpO2:  [96 %-100 %] 97 % (09/22 0427) FiO2 (%):  [28 %] 28 % (09/21 0839) Weight:  [86.3 kg] 86.3 kg (09/22 0427)  Hemodynamic parameters for last 24 hours:    Intake/Output from previous day: 09/21 0701 - 09/22 0700 In: 610 [P.O.:240; I.V.:220; IV Piggyback:150] Out: 1350 [Urine:1350] Intake/Output this shift: No intake/output data recorded.  General appearance: alert, cooperative, and no distress Heart: regular rate and rhythm, no rub, and no murmur Lungs: clear to auscultation bilaterally Abdomen: benign Extremities: no edema Wound: incis healing well  Lab Results: Recent Labs    04/25/22 0417 04/26/22 0404  WBC 4.6 5.3  HGB 7.6* 7.9*  HCT 22.9* 24.0*  PLT 49* 60*   BMET:  Recent Labs    04/25/22 0417 04/26/22 0404  NA 136 139  K 4.1 4.1  CL 106 108  CO2 25 21*  GLUCOSE 96 100*  BUN 18 16  CREATININE 0.67 0.76  CALCIUM 8.3* 8.4*    PT/INR:  Recent Labs    04/26/22 0404  LABPROT 15.9*  INR 1.3*   ABG    Component Value Date/Time   PHART 7.372 04/23/2022 1916   HCO3 18.7 (L) 04/23/2022 1916   TCO2 20 (L) 04/23/2022 1916   ACIDBASEDEF 6.0 (H) 04/23/2022 1916   O2SAT 96 04/23/2022 1916   CBG (last 3)  Recent Labs    04/25/22 2020 04/26/22 0003 04/26/22 0424  GLUCAP 116* 118* 82    Meds Scheduled Meds:  sodium chloride   Intravenous Once   acetaminophen  1,000 mg Oral Q6H   Or   acetaminophen  (TYLENOL) oral liquid 160 mg/5 mL  1,000 mg Per Tube Q6H   aspirin EC  325 mg Oral Daily   Or   aspirin  324 mg Per Tube Daily   bisacodyl  10 mg Oral Daily   Or   bisacodyl  10 mg Rectal Daily   Chlorhexidine Gluconate Cloth  6 each Topical Q0600   docusate sodium  100 mg Oral BID   enoxaparin (LOVENOX) injection  40 mg Subcutaneous QHS   escitalopram  10 mg Oral Daily   feeding supplement  237 mL Oral BID BM   gabapentin  300 mg Oral TID   insulin aspart  0-24 Units Subcutaneous Q4H   methadone  135 mg Oral Daily   metoprolol tartrate  12.5 mg Oral BID   Or   metoprolol tartrate  12.5 mg Per Tube BID   multivitamin with minerals  1 tablet Oral Daily   nicotine  14 mg Transdermal Daily   pantoprazole  40 mg Oral Daily   sodium chloride flush  3 mL Intravenous Q12H   sodium chloride flush  3 mL Intravenous Q12H   warfarin  2.5 mg Oral q1600   Warfarin - Physician Dosing Inpatient   Does not apply q1600   Continuous Infusions:  sodium  chloride     sodium chloride     sodium chloride     sodium chloride     ceFTAROline (TEFLARO) IV 600 mg (04/26/22 0518)   lactated ringers     lactated ringers 10 mL/hr at 04/25/22 1100   lactated ringers     lactated ringers Stopped (04/25/22 0524)   milrinone     nitroGLYCERIN Stopped (04/23/22 1336)   promethazine (PHENERGAN) injection (IM or IVPB) Stopped (04/25/22 0901)   sodium chloride     vancomycin 1,250 mg (04/26/22 3570)   vasopressin Stopped (04/24/22 0720)   PRN Meds:.sodium chloride, sodium chloride, acetaminophen, alum & mag hydroxide-simeth, bisacodyl, diclofenac Sodium, docusate sodium, hydrOXYzine, ketorolac, lactated ringers, loperamide, methocarbamol, metoprolol tartrate, morphine injection, mouth rinse, oxyCODONE, polyethylene glycol, promethazine (PHENERGAN) injection (IM or IVPB), promethazine, sodium chloride, sodium chloride flush, sodium chloride flush, sodium chloride flush  Xrays DG Chest Port 1 View  Result  Date: 04/25/2022 CLINICAL DATA:  [id number]. Chest discomfort following tricuspid valve repair. EXAM: PORTABLE CHEST 1 VIEW COMPARISON:  CT chest yesterday at 5:14 a.m. FINDINGS: Right IJ line and introducer sheath both terminating in the upper SVC. No pneumothorax. Midline sternotomy sutures remain intact with tricuspid valve ring prosthesis again shown. There are 2 mediastinal drains and a right chest tube with no visible pneumothorax. There is cardiomegaly with mild perihilar vascular congestion without overt edema. Moderate left and small right layering pleural effusions and patchy consolidation or atelectasis in the left greater than right mid to lower lungs. The overall aeration is not significantly changed. No new abnormality. Stable mediastinum. IMPRESSION: No new or worsening abnormality is seen. Overall aeration is unchanged. Mild perihilar vascular prominence is also stable. Electronically Signed   By: Telford Nab M.D.   On: 04/25/2022 06:59    Assessment/Plan: S/P Procedure(s) (LRB): TRICUSPID VALVE REPAIR WITH 28MM MC3 TRICUSPID ANNULOPLASTY RING (N/A) TRANSESOPHAGEAL ECHOCARDIOGRAM (TEE) (N/A)  POD#3 1 Tmax 99.1, VSS, sinus rhythm 2 sats good on RA 3 UOP- voiding, not all measured 4 normal renal fxn 5 expected ABLA, improved- will add trinsicon 6 platelet count now showing improved trend 7 INR 1.3 8 ID managing ABX, holding off on PICC 9 methadone for IVDU  LOS: 60 days    John Giovanni PA-C Pager [phone] 04/26/2022   Agree with above Doing well INR goal 2-3 for 3 months Continue ABX  Macintyre Alexa O Jackolyn Geron

## 2022-04-27 LAB — PROTIME-INR
INR: 1.5 — ABNORMAL HIGH (ref 0.8–1.2)
Prothrombin Time: 18.2 seconds — ABNORMAL HIGH (ref 11.4–15.2)

## 2022-04-27 LAB — GLUCOSE, CAPILLARY
Glucose-Capillary: 101 mg/dL — ABNORMAL HIGH (ref 70–99)
Glucose-Capillary: 85 mg/dL (ref 70–99)
Glucose-Capillary: 94 mg/dL (ref 70–99)

## 2022-04-27 LAB — CBC
HCT: 21.7 % — ABNORMAL LOW (ref 39.0–52.0)
Hemoglobin: 7.2 g/dL — ABNORMAL LOW (ref 13.0–17.0)
MCH: 28.7 pg (ref 26.0–34.0)
MCHC: 33.2 g/dL (ref 30.0–36.0)
MCV: 86.5 fL (ref 80.0–100.0)
Platelets: 76 10*3/uL — ABNORMAL LOW (ref 150–400)
RBC: 2.51 MIL/uL — ABNORMAL LOW (ref 4.22–5.81)
RDW: 15.5 % (ref 11.5–15.5)
WBC: 3.9 10*3/uL — ABNORMAL LOW (ref 4.0–10.5)
nRBC: 0 % (ref 0.0–0.2)

## 2022-04-27 NOTE — Progress Notes (Addendum)
      [address]       ,[place] [address]             [phone]      4 Days Post-Op Procedure(s) (LRB): TRICUSPID VALVE REPAIR WITH 28MM MC3 TRICUSPID ANNULOPLASTY RING (N/A) TRANSESOPHAGEAL ECHOCARDIOGRAM (TEE) (N/A)  Subjective:  Patient's nausea improved  Objective: Vital signs in last 24 hours: Temp:  [97.6 F (36.4 C)-98.6 F (37 C)] 98.4 F (36.9 C) (09/23 0753) Pulse Rate:  [73-100] 73 (09/23 0753) Cardiac Rhythm: Normal sinus rhythm (09/23 0830) Resp:  [19-20] 19 (09/23 0753) BP: (116-128)/(54-75) 118/69 (09/23 0753) SpO2:  [95 %-97 %] 96 % (09/23 0753) Weight:  [86.2 kg] 86.2 kg (09/23 0427)  Intake/Output from previous day: 09/22 0701 - 09/23 0700 In: 746 [P.O.:480; IV Piggyback:266] Out: 1950 [Urine:1950] Intake/Output this shift: Total I/O In: 66 [IV Piggyback:66] Out: -   General appearance: alert, cooperative, and no distress Heart: regular rate and rhythm Lungs: clear to auscultation bilaterally Abdomen: soft, non-tender; bowel sounds normal; no masses,  no organomegaly Extremities: extremities normal, atraumatic, no cyanosis or edema Wound: clean and dry  Lab Results: Recent Labs    04/26/22 0404 04/27/22 0237  WBC 5.3 3.9*  HGB 7.9* 7.2*  HCT 24.0* 21.7*  PLT 60* 76*   BMET:  Recent Labs    04/25/22 0417 04/26/22 0404  NA 136 139  K 4.1 4.1  CL 106 108  CO2 25 21*  GLUCOSE 96 100*  BUN 18 16  CREATININE 0.67 0.76  CALCIUM 8.3* 8.4*    PT/INR:  Recent Labs    04/27/22 0237  LABPROT 18.2*  INR 1.5*   ABG    Component Value Date/Time   PHART 7.372 04/23/2022 1916   HCO3 18.7 (L) 04/23/2022 1916   TCO2 20 (L) 04/23/2022 1916   ACIDBASEDEF 6.0 (H) 04/23/2022 1916   O2SAT 96 04/23/2022 1916   CBG (last 3)  Recent Labs    04/27/22 0007 04/27/22 0448 04/27/22 0752  GLUCAP 85 101* 94    Assessment/Plan: S/P Procedure(s) (LRB): TRICUSPID VALVE REPAIR WITH 28MM MC3 TRICUSPID ANNULOPLASTY RING  (N/A) TRANSESOPHAGEAL ECHOCARDIOGRAM (TEE) (N/A)  CV- hemodynamically stable in NSR INR 1.5, continue coumadin for TVR ID- afebrile, no leukocytosis.. OR culture + for SA-- ID following, continue ABX per recommendations CBGs stable, patient is not a diabetic will stop all SSIP H/O Polysubstance abuse- currently on Methadone- will stop IV Morphine today, transition to oral pain medication, will wean off as tolerated Dispo- patient stable, overall doing well from surgical standpoint, IV ABX per ID, nausea improved, can likely transfer patient back to medicine service soon   LOS: 61 days    Ellwood Handler, PA-C 04/27/2022    Chart reviewed, patient examined, agree with above. 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

## 2022-04-27 NOTE — Progress Notes (Signed)
CARDIAC REHAB PHASE I   PRE:  Rate/Rhythm: 80 / NSR  BP:  Sitting: 138/69      SaO2: 100 %  MODE:  Ambulation: 470 ft ft   POST:  Rate/Rhythm: 102 / ST  BP:  Sitting: 131/68      SaO2: 100%  Pt received in bed and agrees to ambulate. Pt ambulated with strong, steady gait (with gait belt in place). Pt appears to be trembling, states he is cold, Some SOB but SaO2 95-98% with walk. Denies dizziness or pain. Pt back to bedside. Encouraged OOB to chair. Pt pulled 1500 mL on the I/S. Encouraged to ambulate later with staff. C/R will follow.  10:45 - 11:04 Lesly Rubenstein, MS, ACSM EP-C, [facility] 04/27/2022 11:04

## 2022-04-27 NOTE — Progress Notes (Signed)
ID PROGRESS NOTE  Followed up with micro lab who found 2 different MRSA isolates from tissue valve. Both being worked up.   For the time being continue on ceftaroline plus daptomycin for now.once closer to discharge for cardiac rehab we will place picc line. Plan is to still need 6 wk iv abtx post surgery  Ivanka Kirshner B. [place] [facility] [phone]

## 2022-04-27 NOTE — Progress Notes (Signed)
Attempted ambulation with pt but he refuses at this time. Primary RN notified and will return re-attempt ambulation if time permits. OOB to chair encouraged.  Time spent with patient and review of chart 8:58 - 9:10.  Lesly Rubenstein MS, ACSM-CEP, CCRP

## 2022-04-28 LAB — CBC
HCT: 22.8 % — ABNORMAL LOW (ref 39.0–52.0)
Hemoglobin: 7.2 g/dL — ABNORMAL LOW (ref 13.0–17.0)
MCH: 28.1 pg (ref 26.0–34.0)
MCHC: 31.6 g/dL (ref 30.0–36.0)
MCV: 89.1 fL (ref 80.0–100.0)
Platelets: 109 10*3/uL — ABNORMAL LOW (ref 150–400)
RBC: 2.56 MIL/uL — ABNORMAL LOW (ref 4.22–5.81)
RDW: 15.4 % (ref 11.5–15.5)
WBC: 3.3 10*3/uL — ABNORMAL LOW (ref 4.0–10.5)
nRBC: 0 % (ref 0.0–0.2)

## 2022-04-28 LAB — BASIC METABOLIC PANEL
Anion gap: 7 (ref 5–15)
BUN: 17 mg/dL (ref 6–20)
CO2: 24 mmol/L (ref 22–32)
Calcium: 8.6 mg/dL — ABNORMAL LOW (ref 8.9–10.3)
Chloride: 108 mmol/L (ref 98–111)
Creatinine, Ser: 0.68 mg/dL (ref 0.61–1.24)
GFR, Estimated: >60 mL/min (ref >60)
Glucose, Bld: 87 mg/dL (ref 70–99)
Potassium: 4.3 mmol/L (ref 3.5–5.1)
Sodium: 139 mmol/L (ref 135–145)

## 2022-04-28 LAB — PROTIME-INR
INR: 1.5 — ABNORMAL HIGH (ref 0.8–1.2)
Prothrombin Time: 17.5 seconds — ABNORMAL HIGH (ref 11.4–15.2)

## 2022-04-28 MED ORDER — KETOROLAC TROMETHAMINE 15 MG/ML IJ SOLN: 15.0000 mg | Freq: Three times a day (TID) | INTRAMUSCULAR | Status: DC | PRN

## 2022-04-28 MED ORDER — WARFARIN SODIUM 5 MG PO TABS
5.0000 mg | ORAL_TABLET | Freq: Every day | ORAL | Status: DC
  Administered 2022-04-28 – 2022-05-05 (×8): 5 mg via ORAL
  Filled 2022-04-28 (×8): qty 1

## 2022-04-28 MED ORDER — METOPROLOL TARTRATE 12.5 MG HALF TABLET
12.5000 mg | ORAL_TABLET | Freq: Two times a day (BID) | ORAL | Status: DC
  Administered 2022-04-28 – 2022-05-10 (×21): 12.5 mg
  Filled 2022-04-28 (×26): qty 1

## 2022-04-28 NOTE — Progress Notes (Addendum)
      [address]       [place] [address]             [phone]      5 Days Post-Op Procedure(s) (LRB): TRICUSPID VALVE REPAIR WITH 28MM MC3 TRICUSPID ANNULOPLASTY RING (N/A) TRANSESOPHAGEAL ECHOCARDIOGRAM (TEE) (N/A)  Subjective:  Patient doing well, tolerated discontinuation of Morphine yesterday.  Again would like to go back to 5th floor.  Objective: Vital signs in last 24 hours: Temp:  [98.2 F (36.8 C)-98.9 F (37.2 C)] 98.9 F (37.2 C) (09/24 0825) Pulse Rate:  [66-82] 66 (09/24 0825) Cardiac Rhythm: Normal sinus rhythm (09/23 2105) Resp:  [17-20] 17 (09/24 0825) BP: (110-124)/(54-68) 110/54 (09/24 0825) SpO2:  [95 %-100 %] 98 % (09/24 0825) Weight:  [84.8 kg] 84.8 kg (09/24 0552)  Intake/Output from previous day: 09/23 0701 - 09/24 0700 In: 306 [P.O.:240; IV Piggyback:66] Out: 725 [Urine:725] Intake/Output this shift: Total I/O In: -  Out: 800 [Urine:800]  General appearance: alert, cooperative, and no distress Heart: regular rate and rhythm Lungs: clear to auscultation bilaterally Abdomen: soft, non-tender; bowel sounds normal; no masses,  no organomegaly Extremities: extremities normal, atraumatic, no cyanosis or edema Wound: clean and dry  Lab Results: Recent Labs    04/27/22 0237 04/28/22 0724  WBC 3.9* 3.3*  HGB 7.2* 7.2*  HCT 21.7* 22.8*  PLT 76* 109*   BMET:  Recent Labs    04/26/22 0404 04/28/22 0724  NA 139 139  K 4.1 4.3  CL 108 108  CO2 21* 24  GLUCOSE 100* 87  BUN 16 17  CREATININE 0.76 0.68  CALCIUM 8.4* 8.6*    PT/INR:  Recent Labs    04/28/22 0724  LABPROT 17.5*  INR 1.5*   ABG    Component Value Date/Time   PHART 7.372 04/23/2022 1916   HCO3 18.7 (L) 04/23/2022 1916   TCO2 20 (L) 04/23/2022 1916   ACIDBASEDEF 6.0 (H) 04/23/2022 1916   O2SAT 96 04/23/2022 1916   CBG (last 3)  Recent Labs    04/27/22 0007 04/27/22 0448 04/27/22 0752  GLUCAP 85 101* 94    Assessment/Plan: S/P  Procedure(s) (LRB): TRICUSPID VALVE REPAIR WITH 28MM MC3 TRICUSPID ANNULOPLASTY RING (N/A) TRANSESOPHAGEAL ECHOCARDIOGRAM (TEE) (N/A)  CV- remains in NSR INR 1.5, will increase coumadin to 5 mg daily ID- afebrile, leukocytosis, OR culture + MSSA, ID following, continue current ABX, he will require 6 weeks IV treatment post surgery H/O Polysubstance abuse- currently Methadone, tolerated weaning of IV Morphine, will work on weaning Oxycodone as tolerated DIspo- patient stable,continue current care, possibly transfer patient back to Medicine service tomorrow, will defer to Dr. Kipp Brood   LOS: 62 days   Ellwood Handler, PA-C 04/28/2022   Chart reviewed, patient examined, agree with above. Doing well overall. INR stalled at 1.5. Coumadin increased to 5 mg. I think he is stable enough to go back on medical service to complete antibiotic course.

## 2022-04-29 ENCOUNTER — Encounter: Payer: Self-pay | Admitting: *Deleted

## 2022-04-29 DIAGNOSIS — R7881 Bacteremia: Secondary | ICD-10-CM | POA: Diagnosis not present

## 2022-04-29 DIAGNOSIS — B9561 Methicillin susceptible Staphylococcus aureus infection as the cause of diseases classified elsewhere: Secondary | ICD-10-CM | POA: Diagnosis not present

## 2022-04-29 DIAGNOSIS — B9562 Methicillin resistant Staphylococcus aureus infection as the cause of diseases classified elsewhere: Secondary | ICD-10-CM | POA: Diagnosis not present

## 2022-04-29 LAB — PROTIME-INR
INR: 1.5 — ABNORMAL HIGH (ref 0.8–1.2)
Prothrombin Time: 18.4 seconds — ABNORMAL HIGH (ref 11.4–15.2)

## 2022-04-29 MED ORDER — OXYCODONE HCL 5 MG PO TABS
15.0000 mg | ORAL_TABLET | Freq: Four times a day (QID) | ORAL | Status: DC | PRN
  Administered 2022-04-29 (×2): 15 mg via ORAL
  Filled 2022-04-29 (×2): qty 3

## 2022-04-29 MED ORDER — NAPROXEN 250 MG PO TABS
250.0000 mg | ORAL_TABLET | Freq: Two times a day (BID) | ORAL | Status: DC
  Administered 2022-04-29 – 2022-05-02 (×7): 250 mg via ORAL
  Filled 2022-04-29 (×8): qty 1

## 2022-04-29 NOTE — Progress Notes (Signed)
[address]       [place] [address]             [phone]      6 Days Post-Op Procedure(s) (LRB): TRICUSPID VALVE REPAIR WITH 28MM MC3 TRICUSPID ANNULOPLASTY RING (N/A) TRANSESOPHAGEAL ECHOCARDIOGRAM (TEE) (N/A) Subjective: Feels well but pain as not well controlled off of toradol  Objective: Vital signs in last 24 hours: Temp:  [98.2 F (36.8 C)-98.9 F (37.2 C)] 98.2 F (36.8 C) (09/24 1708) Pulse Rate:  [66-77] 77 (09/24 1708) Cardiac Rhythm: Normal sinus rhythm (09/24 1900) Resp:  [17-19] 18 (09/24 1708) BP: (110-121)/(54-70) 121/67 (09/24 1708) SpO2:  [96 %-98 %] 98 % (09/24 1708)  Hemodynamic parameters for last 24 hours:    Intake/Output from previous day: 09/24 0701 - 09/25 0700 In: -  Out: 1300 [Urine:1300] Intake/Output this shift: No intake/output data recorded.  General appearance: alert, cooperative, and no distress Heart: regular rate and rhythm, no rub, and no murmur Lungs: clear to auscultation bilaterally Abdomen: benign Extremities: no edema Wound: incis healing well  Lab Results: Recent Labs    04/27/22 0237 04/28/22 0724  WBC 3.9* 3.3*  HGB 7.2* 7.2*  HCT 21.7* 22.8*  PLT 76* 109*   BMET:  Recent Labs    04/28/22 0724  NA 139  K 4.3  CL 108  CO2 24  GLUCOSE 87  BUN 17  CREATININE 0.68  CALCIUM 8.6*    PT/INR:  Recent Labs    04/29/22 0137  LABPROT 18.4*  INR 1.5*   ABG    Component Value Date/Time   PHART 7.372 04/23/2022 1916   HCO3 18.7 (L) 04/23/2022 1916   TCO2 20 (L) 04/23/2022 1916   ACIDBASEDEF 6.0 (H) 04/23/2022 1916   O2SAT 96 04/23/2022 1916   CBG (last 3)  Recent Labs    04/27/22 0007 04/27/22 0448 04/27/22 0752  GLUCAP 85 101* 94    Meds Scheduled Meds:  sodium chloride   Intravenous Once   aspirin EC  325 mg Oral Daily   Or   aspirin  324 mg Per Tube Daily   bisacodyl  10 mg Oral Daily   Or   bisacodyl  10 mg Rectal Daily   Chlorhexidine Gluconate Cloth  6  each Topical Q0600   docusate sodium  100 mg Oral BID   enoxaparin (LOVENOX) injection  40 mg Subcutaneous QHS   escitalopram  10 mg Oral Daily   feeding supplement  237 mL Oral BID BM   ferrous [id number] C-folic acid  1 capsule Oral BID PC   gabapentin  300 mg Oral TID   methadone  135 mg Oral Daily   metoprolol tartrate  12.5 mg Per Tube BID   multivitamin with minerals  1 tablet Oral Daily   nicotine  14 mg Transdermal Daily   pantoprazole  40 mg Oral Daily   warfarin  5 mg Oral q1600   Warfarin - Physician Dosing Inpatient   Does not apply q1600   Continuous Infusions:  ceFTAROline (TEFLARO) IV 600 mg (04/28/22 2232)   DAPTOmycin (CUBICIN) 800 mg in sodium chloride 0.9 % IVPB 800 mg (04/28/22 2112)   promethazine (PHENERGAN) injection (IM or IVPB) 12.5 mg (04/29/22 0048)   PRN Meds:.acetaminophen, alum & mag hydroxide-simeth, bisacodyl, diclofenac Sodium, docusate sodium, hydrOXYzine, loperamide, methocarbamol, metoprolol tartrate, mouth rinse, oxyCODONE, polyethylene glycol, promethazine (PHENERGAN) injection (IM or IVPB), promethazine, sodium chloride flush  Xrays No results found.  Assessment/Plan: S/P Procedure(s) (  LRB): TRICUSPID VALVE REPAIR WITH MC3 TRICUSPID ANNULOPLASTY RING (N/A) TRANSESOPHAGEAL ECHOCARDIOGRAM (TEE) (N/A) POD#6  1 afeb, VSS, sinus rhythm 2 O2 sats RA 98% 3 adeq UOP 4 INR 1.5, on 5 of coumadin, increased from 2.5 yesterday, follow 5 ID has outlined IV abx 6 Pain meds, methadone, weaning narcotics, will add naproxen po and wean oxy to 15 mg 7 routine pulm hygiene and rehab,  8 poss tx to hospitalist?      LOS: 63 days    Rowe Clack PA-C Pager [phone] 04/29/2022

## 2022-04-29 NOTE — Progress Notes (Signed)
[facility]  Date of Admission:  02/25/2022           Reason for visit: Follow up on TV endocarditis  Current antibiotics: Daptomycin Ceftaroline  ASSESSMENT:    31 y.o. male admitted with:  MSSA/MRSA tricuspid valve endocarditis Status post repair with tricuspid annuloplasty ring on 04/23/2022 with CT surgery.  Currently on daptomycin and ceftaroline combination therapy.  Awaiting repeat susceptibilities of most recent cultures.   RECOMMENDATIONS:    Continue daptomycin Continue ceftaroline Await further sensitivities Will follow   Principal Problem:   Endocarditis of tricuspid valve Active Problems:   MRSA bacteremia   Chronic pain syndrome/methadone abuse    Tobacco abuse   Substance use disorder   Hepatitis C, chronic ([name])   Anxiety   Normocytic anemia   Prolonged QT interval   Sepsis ([name])   Elevated serum creatinine   Hypokalemia   Hypomagnesemia   Hyponatremia   Acute bacterial endocarditis   MSSA bacteremia   IVDU (intravenous drug user)   Pneumonia of both lower lobes due to methicillin resistant Staphylococcus aureus (MRSA) ([name])   Thrombocytopenia ([name])   Encounter for preoperative dental examination   Caries   Retained tooth root   Chronic apical periodontitis   Chronic periodontitis   Accretions on teeth   Impacted third molar tooth   Teeth missing   Phobia of dental procedure   Shock ([name])   Acute respiratory failure ([name])   Pleural effusion    MEDICATIONS:    Scheduled Meds:  sodium chloride   Intravenous Once   aspirin EC  325 mg Oral Daily   Or   aspirin  324 mg Per Tube Daily   bisacodyl  10 mg Oral Daily   Or   bisacodyl  10 mg Rectal Daily   Chlorhexidine Gluconate Cloth  6 each Topical Q0600   docusate sodium  100 mg Oral BID   enoxaparin (LOVENOX) injection  40 mg Subcutaneous QHS   escitalopram  10 mg Oral Daily   feeding supplement  237 mL Oral BID BM   ferrous fumarate-b12-vitamic  C-folic acid  1 capsule Oral BID PC   gabapentin  300 mg Oral TID   methadone  135 mg Oral Daily   metoprolol tartrate  12.5 mg Per Tube BID   multivitamin with minerals  1 tablet Oral Daily   naproxen  250 mg Oral BID WC   nicotine  14 mg Transdermal Daily   pantoprazole  40 mg Oral Daily   warfarin  5 mg Oral q1600   Warfarin - Physician Dosing Inpatient   Does not apply q1600   Continuous Infusions:  ceFTAROline (TEFLARO) IV 600 mg (04/29/22 0818)   DAPTOmycin (CUBICIN) 800 mg in sodium chloride 0.9 % IVPB 800 mg (04/28/22 2112)   promethazine (PHENERGAN) injection (IM or IVPB) 12.5 mg (04/29/22 0048)   PRN Meds:.acetaminophen, alum & mag hydroxide-simeth, bisacodyl, diclofenac Sodium, docusate sodium, hydrOXYzine, loperamide, methocarbamol, metoprolol tartrate, mouth rinse, oxyCODONE, polyethylene glycol, promethazine (PHENERGAN) injection (IM or IVPB), promethazine, sodium chloride flush  SUBJECTIVE:     No new complaints today other than reports his pain is not as well controlled with out Toradol.  He reports his nausea is improved.  Review of Systems  All other systems reviewed and are negative.     OBJECTIVE:   Blood pressure 117/60, pulse 70, temperature 98.5 F (36.9 C), temperature source Oral, resp. rate 16, height 5\' 10"  (1.778 m), weight 84.8  kg, SpO2 96 %. Body mass index is 26.83 kg/m.  Physical Exam Constitutional:      Appearance: Normal appearance.  Eyes:     Extraocular Movements: Extraocular movements intact.     Conjunctiva/sclera: Conjunctivae normal.  Pulmonary:     Effort: Pulmonary effort is normal. No respiratory distress.  Abdominal:     General: There is no distension.     Palpations: Abdomen is soft.  Musculoskeletal:     Cervical back: Normal range of motion and neck supple.  Skin:    General: Skin is warm and dry.  Neurological:     General: No focal deficit present.     Mental Status: He is alert and oriented to person, place, and  time.  Psychiatric:        Mood and Affect: Mood normal.        Behavior: Behavior normal.      Lab Results: Lab Results  Component Value Date   WBC 3.3 (L) 04/28/2022   HGB 7.2 (L) 04/28/2022   HCT 22.8 (L) 04/28/2022   MCV 89.1 04/28/2022   PLT 109 (L) 04/28/2022    Lab Results  Component Value Date   NA 139 04/28/2022   K 4.3 04/28/2022   CO2 24 04/28/2022   GLUCOSE 87 04/28/2022   BUN 17 04/28/2022   CREATININE 0.68 04/28/2022   CALCIUM 8.6 (L) 04/28/2022   GFRNONAA >60 04/28/2022    Lab Results  Component Value Date   ALT 31 04/25/2022   AST 34 04/25/2022   GGT 27 12/24/2021   ALKPHOS 52 04/25/2022   BILITOT 0.3 04/25/2022       Component Value Date/Time   CRP 12.1 (H) 12/26/2021 1216       Component Value Date/Time   ESRSEDRATE 49 (H) 12/26/2021 1216     I have reviewed the micro and lab results in Epic.  Imaging: No results found.   Imaging independently reviewed in Epic.    [facility] for [facility] [phone] pager 04/29/2022, 12:10 PM

## 2022-04-29 NOTE — Progress Notes (Signed)
CARDIAC REHAB PHASE I   PRE:  Rate/Rhythm: 64 NSR  BP:  Sitting: 110/48      SaO2: 98 RA  MODE:  Ambulation: 810 ft   POST:  Rate/Rhythm: 93 NSR  BP:  Sitting: 140/62      SaO2: 100 RA   Pt was agreeable to ambulation and was ambulated through hallways with standby assistance. Pt was asymptomatic throughout ambulation and was returned to room w/o complaints.  [phone]  Reginald Clarke  1:20 PM 04/29/2022

## 2022-04-30 LAB — BASIC METABOLIC PANEL
Anion gap: 6 (ref 5–15)
BUN: 16 mg/dL (ref 6–20)
CO2: 26 mmol/L (ref 22–32)
Calcium: 8.8 mg/dL — ABNORMAL LOW (ref 8.9–10.3)
Chloride: 105 mmol/L (ref 98–111)
Creatinine, Ser: 0.85 mg/dL (ref 0.61–1.24)
GFR, Estimated: >60 mL/min (ref >60)
Glucose, Bld: 86 mg/dL (ref 70–99)
Potassium: 4.4 mmol/L (ref 3.5–5.1)
Sodium: 137 mmol/L (ref 135–145)

## 2022-04-30 LAB — CBC
HCT: 25.6 % — ABNORMAL LOW (ref 39.0–52.0)
Hemoglobin: 8.3 g/dL — ABNORMAL LOW (ref 13.0–17.0)
MCH: 28.4 pg (ref 26.0–34.0)
MCHC: 32.4 g/dL (ref 30.0–36.0)
MCV: 87.7 fL (ref 80.0–100.0)
Platelets: 146 10*3/uL — ABNORMAL LOW (ref 150–400)
RBC: 2.92 MIL/uL — ABNORMAL LOW (ref 4.22–5.81)
RDW: 15.4 % (ref 11.5–15.5)
WBC: 3.4 10*3/uL — ABNORMAL LOW (ref 4.0–10.5)
nRBC: 0 % (ref 0.0–0.2)

## 2022-04-30 LAB — PROTIME-INR
INR: 1.8 — ABNORMAL HIGH (ref 0.8–1.2)
Prothrombin Time: 20.4 seconds — ABNORMAL HIGH (ref 11.4–15.2)

## 2022-04-30 MED ORDER — VITAMIN C 500 MG PO TABS
500.0000 mg | ORAL_TABLET | Freq: Two times a day (BID) | ORAL | Status: DC
  Administered 2022-05-01 – 2022-06-04 (×69): 500 mg via ORAL
  Filled 2022-04-30 (×69): qty 1

## 2022-04-30 MED ORDER — FERROUS FUMARATE 324 (106 FE) MG PO TABS
1.0000 | ORAL_TABLET | Freq: Two times a day (BID) | ORAL | Status: DC
  Administered 2022-05-01 – 2022-06-04 (×69): 106 mg via ORAL
  Filled 2022-04-30 (×71): qty 1

## 2022-04-30 MED ORDER — VITAMIN B-12 100 MCG PO TABS
100.0000 ug | ORAL_TABLET | Freq: Two times a day (BID) | ORAL | Status: DC
  Administered 2022-05-01 – 2022-06-04 (×69): 100 ug via ORAL
  Filled 2022-04-30 (×71): qty 1

## 2022-04-30 MED ORDER — FOLIC ACID 1 MG PO TABS
1.0000 mg | ORAL_TABLET | Freq: Two times a day (BID) | ORAL | Status: DC
  Administered 2022-05-01 – 2022-06-04 (×69): 1 mg via ORAL
  Filled 2022-04-30 (×69): qty 1

## 2022-04-30 NOTE — Progress Notes (Signed)
[address]       [place] [address]             [phone]      7 Days Post-Op Procedure(s) (LRB): TRICUSPID VALVE REPAIR WITH MC3 TRICUSPID ANNULOPLASTY RING (N/A) TRANSESOPHAGEAL ECHOCARDIOGRAM (TEE) (N/A) Subjective: Feels pretty well  Objective: Vital signs in last 24 hours: Temp:  [97.8 F (36.6 C)-98.5 F (36.9 C)] 98.2 F (36.8 C) (09/26 0600) Pulse Rate:  [61-97] 82 (09/26 0600) Cardiac Rhythm: Normal sinus rhythm (09/25 1934) Resp:  [14-20] 14 (09/26 0600) BP: (109-140)/(56-73) 109/56 (09/26 0600) SpO2:  [92 %-99 %] 92 % (09/26 0600) Weight:  [84.7 kg] 84.7 kg (09/26 0500)  Hemodynamic parameters for last 24 hours:    Intake/Output from previous day: 09/25 0701 - 09/26 0700 In: -  Out: 5400 [Urine:5400] Intake/Output this shift: No intake/output data recorded.  NAD Lungs - mildly dim in bases Cor- RRR, faint rub, no murmur ABD- benign exam Ext no edema Incis healing well    Lab Results: Recent Labs    04/28/22 0724 04/30/22 0620  WBC 3.3* 3.4*  HGB 7.2* 8.3*  HCT 22.8* 25.6*  PLT 109* 146*   BMET:  Recent Labs    04/28/22 0724 04/30/22 0620  NA 139 137  K 4.3 4.4  CL 108 105  CO2 24 26  GLUCOSE 87 86  BUN 17 16  CREATININE 0.68 0.85  CALCIUM 8.6* 8.8*    PT/INR:  Recent Labs    04/30/22 0620  LABPROT 20.4*  INR 1.8*   ABG    Component Value Date/Time   PHART 7.372 04/23/2022 1916   HCO3 18.7 (L) 04/23/2022 1916   TCO2 20 (L) 04/23/2022 1916   ACIDBASEDEF 6.0 (H) 04/23/2022 1916   O2SAT 96 04/23/2022 1916   CBG (last 3)  No results for input(s): "GLUCAP" in the last 72 hours.  Meds Scheduled Meds:  sodium chloride   Intravenous Once   aspirin EC  325 mg Oral Daily   Or   aspirin  324 mg Per Tube Daily   bisacodyl  10 mg Oral Daily   Or   bisacodyl  10 mg Rectal Daily   Chlorhexidine Gluconate Cloth  6 each Topical Q0600   docusate sodium  100 mg Oral BID   enoxaparin (LOVENOX)  injection  40 mg Subcutaneous QHS   escitalopram  10 mg Oral Daily   feeding supplement  237 mL Oral BID BM   ferrous fumarate-b12-vitamic C-folic acid  1 capsule Oral BID PC   gabapentin  300 mg Oral TID   methadone  135 mg Oral Daily   metoprolol tartrate  12.5 mg Per Tube BID   multivitamin with minerals  1 tablet Oral Daily   naproxen  250 mg Oral BID WC   nicotine  14 mg Transdermal Daily   pantoprazole  40 mg Oral Daily   warfarin  5 mg Oral q1600   Warfarin - Physician Dosing Inpatient   Does not apply q1600   Continuous Infusions:  ceFTAROline (TEFLARO) IV 600 mg (04/30/22 2130)   DAPTOmycin (CUBICIN) 800 mg in sodium chloride 0.9 % IVPB 800 mg (04/29/22 2007)   promethazine (PHENERGAN) injection (IM or IVPB) 12.5 mg (04/29/22 0048)   PRN Meds:.acetaminophen, alum & mag hydroxide-simeth, bisacodyl, diclofenac Sodium, docusate sodium, hydrOXYzine, loperamide, methocarbamol, metoprolol tartrate, mouth rinse, oxyCODONE, polyethylene glycol, promethazine (PHENERGAN) injection (IM or IVPB), promethazine, sodium chloride flush  Xrays No results found.  Assessment/Plan: S/P Procedure(s) (LRB): TRICUSPID VALVE REPAIR WITH 28MM MC3 TRICUSPID ANNULOPLASTY RING (N/A) TRANSESOPHAGEAL ECHOCARDIOGRAM (TEE) (N/A) POD#7  1 afeb, VSS sinus rhythm 2 sats good on RA 3 excellent UOP 4 normal renal fxn 5 minor leukopenia, WBC- 3.4, fairly stable, monitor closely on abx 6 abx as per ID recs 7 INR 1.8 cont 5 mg for now  8 will tx to [place] and spoke with hospitalist and they will resume care tomorrow  LOS: 64 days    John Giovanni PA-C Pager [phone] 04/30/2022

## 2022-04-30 NOTE — Progress Notes (Signed)
CARDIAC REHAB PHASE I   PRE:  Rate/Rhythm:76 NSR  BP:  Sitting: 110/51      SaO2: RA  MODE:  Ambulation: 470 ft   POST:  Rate/Rhythm: 95 NSR  BP:  Sitting: 129/67      SaO2: RA  Pt was ambulated through hallways with standby assistance through hallways. Pt was asymptomatic throughout ambulation and was returned to room w/o complaints.   Christen Bame  11:37 AM 04/30/2022

## 2022-05-01 ENCOUNTER — Inpatient Hospital Stay: Payer: Self-pay

## 2022-05-01 DIAGNOSIS — F419 Anxiety disorder, unspecified: Secondary | ICD-10-CM | POA: Diagnosis not present

## 2022-05-01 DIAGNOSIS — I079 Rheumatic tricuspid valve disease, unspecified: Secondary | ICD-10-CM | POA: Diagnosis not present

## 2022-05-01 DIAGNOSIS — G894 Chronic pain syndrome: Secondary | ICD-10-CM | POA: Diagnosis not present

## 2022-05-01 LAB — CREATININE, SERUM
Creatinine, Ser: 0.89 mg/dL (ref 0.61–1.24)
GFR, Estimated: >60 mL/min (ref >60)

## 2022-05-01 LAB — PROTIME-INR
INR: 1.9 — ABNORMAL HIGH (ref 0.8–1.2)
Prothrombin Time: 21.5 seconds — ABNORMAL HIGH (ref 11.4–15.2)

## 2022-05-01 MED ORDER — SODIUM CHLORIDE 0.9% FLUSH
10.0000 mL | INTRAVENOUS | Status: DC | PRN
  Administered 2022-05-04 – 2022-06-02 (×4): 10 mL

## 2022-05-01 MED ORDER — SODIUM CHLORIDE 0.9% FLUSH
10.0000 mL | Freq: Two times a day (BID) | INTRAVENOUS | Status: DC
  Administered 2022-05-01 – 2022-05-15 (×27): 10 mL

## 2022-05-01 MED ORDER — WARFARIN - PHARMACIST DOSING INPATIENT: Freq: Every day | Status: DC

## 2022-05-01 MED ORDER — CHLORHEXIDINE GLUCONATE CLOTH 2 % EX PADS
6.0000 | MEDICATED_PAD | Freq: Every day | CUTANEOUS | Status: DC
  Administered 2022-05-01 – 2022-06-04 (×35): 6 via TOPICAL

## 2022-05-01 NOTE — Progress Notes (Addendum)
[address]       [employer] [address]             [phone]      8 Days Post-Op Procedure(s) (LRB): TRICUSPID VALVE REPAIR WITH 28MM MC3 TRICUSPID ANNULOPLASTY RING (N/A) TRANSESOPHAGEAL ECHOCARDIOGRAM (TEE) (N/A) Subjective: No surgical issues, feels pretty well  Objective: Vital signs in last 24 hours: Temp:  [98.2 F (36.8 C)-98.6 F (37 C)] 98.2 F (36.8 C) (09/26 2310) Pulse Rate:  [70-81] 73 (09/26 2310) Cardiac Rhythm: Normal sinus rhythm (09/26 1900) Resp:  [16-20] 18 (09/26 2310) BP: (110-130)/(56-78) 112/56 (09/26 2310) SpO2:  [99 %-100 %] 100 % (09/26 1202)  Hemodynamic parameters for last 24 hours:    Intake/Output from previous day: No intake/output data recorded. Intake/Output this shift: No intake/output data recorded.  General appearance: alert, cooperative, and no distress Heart: regular rate and rhythm Lungs: min dimin in bases Abdomen: benign Extremities: no edema or calf tenderness Wound: incis healing well  Lab Results: Recent Labs    04/30/22 0620  WBC 3.4*  HGB 8.3*  HCT 25.6*  PLT 146*   BMET:  Recent Labs    04/30/22 0620  NA 137  K 4.4  CL 105  CO2 26  GLUCOSE 86  BUN 16  CREATININE 0.85  CALCIUM 8.8*    PT/INR:  Recent Labs    04/30/22 0620  LABPROT 20.4*  INR 1.8*   ABG    Component Value Date/Time   PHART 7.372 04/23/2022 1916   HCO3 18.7 (L) 04/23/2022 1916   TCO2 20 (L) 04/23/2022 1916   ACIDBASEDEF 6.0 (H) 04/23/2022 1916   O2SAT 96 04/23/2022 1916   CBG (last 3)  No results for input(s): "GLUCAP" in the last 72 hours.  Meds Scheduled Meds:  sodium chloride   Intravenous Once   Ferrous Fumarate  1 tablet Oral BID WC   And   vitamin B-12  100 mcg Oral BID WC   And   ascorbic acid  500 mg Oral BID WC   And   folic acid  1 mg Oral BID WC   aspirin EC  325 mg Oral Daily   Or   aspirin  324 mg Per Tube Daily   bisacodyl  10 mg Oral Daily   Or   bisacodyl  10 mg Rectal  Daily   docusate sodium  100 mg Oral BID   enoxaparin (LOVENOX) injection  40 mg Subcutaneous QHS   escitalopram  10 mg Oral Daily   feeding supplement  237 mL Oral BID BM   gabapentin  300 mg Oral TID   methadone  135 mg Oral Daily   metoprolol tartrate  12.5 mg Per Tube BID   multivitamin with minerals  1 tablet Oral Daily   naproxen  250 mg Oral BID WC   nicotine  14 mg Transdermal Daily   pantoprazole  40 mg Oral Daily   warfarin  5 mg Oral q1600   Warfarin - Physician Dosing Inpatient   Does not apply q1600   Continuous Infusions:  ceFTAROline (TEFLARO) IV 600 mg (05/01/22 0720)   DAPTOmycin (CUBICIN) 800 mg in sodium chloride 0.9 % IVPB Stopped (05/01/22 0705)   promethazine (PHENERGAN) injection (IM or IVPB) 12.5 mg (04/29/22 0048)   PRN Meds:.acetaminophen, alum & mag hydroxide-simeth, bisacodyl, diclofenac Sodium, docusate sodium, hydrOXYzine, loperamide, methocarbamol, metoprolol tartrate, mouth rinse, oxyCODONE, polyethylene glycol, promethazine (PHENERGAN) injection (IM or IVPB), promethazine, sodium chloride flush  Xrays  No results found.  Assessment/Plan: S/P Procedure(s) (LRB): TRICUSPID VALVE REPAIR WITH MC3 TRICUSPID ANNULOPLASTY RING (N/A) TRANSESOPHAGEAL ECHOCARDIOGRAM (TEE) (N/A) POD#8   1 afeb, VSS 2 O2 sats good on RA 3 voiding well 4 INR pending- goal 2.0-3.0 for 3 months 5 hospitalist to resume management today, ID conts to give ABC recs- appreciate care 6 routine pulm hygiene and rehab, wean narcotics and cont methadone   LOS: 65 days    Rowe Clack 05/01/2022    Agree with above Will need INR of 2-3 for 3 months Will follow peripherally  Vlad Mayberry O Beauregard Jarrells

## 2022-05-01 NOTE — Progress Notes (Signed)
ANTICOAGULATION CONSULT NOTE - Initial Consult  Pharmacy Consult for Warfarin Indication: tricuspid valve repair  No Known Allergies  Patient Measurements: Height: 5\' 10"  (177.8 cm) Weight: 84.7 kg (186 lb 12.8 oz) IBW/kg (Calculated) : 73  Vital Signs: Temp: 98.2 F (36.8 C) (09/27 1500) Temp Source: Oral (09/27 1500) BP: 106/56 (09/27 1500) Pulse Rate: 80 (09/27 1500)  Labs: Recent Labs    04/29/22 0137 04/30/22 0620 05/01/22 0835  HGB  --  8.3*  --   HCT  --  25.6*  --   PLT  --  146*  --   LABPROT 18.4* 20.4* 21.5*  INR 1.5* 1.8* 1.9*  CREATININE  --  0.85 0.89    Estimated Creatinine Clearance: 124.2 mL/min (by C-G formula based on SCr of 0.89 mg/dL).   Medical History: Past Medical History:  Diagnosis Date   Anxiety    Endocarditis    Hepatitis    hx of hep C per pt   Opiate dependence ([place])    on methadone   Assessment:  31 yr old male s/p tricuspid valve repair on 04/23/22.  Warfarin initiated POD# 1 with 2.5 mg daily along with Enoxaparin 40 mg SQ q24h.  INR 1.3>>1.5 after 2.5 mg daily x 4 doses and increased to 5 mg daily on 9/24. INR up to 1.9 today.  Just below goal.  Also on Aspirin 325 mg daily, Naproxen 250 mg PO BID with meals, and Trinsicon BID (using components due to backorder).    Hemoglobin 7s > 8.3 post-op.     Platelet count down to ~50 post-op, but trended back to 146K on 9/26.     No bleeding reported.  Goal of Therapy:  INR 2-3 Monitor platelets by anticoagulation protocol: Yes   Plan:  Continue warfarin 5 mg daily. Daily PT/INR. Stop Enoxaparin when INR at goal. Decrease Aspirin to 81 mg daily when INR at goal? Follow up for changing Naproxen to prn when able. On Protonix 40 mg daily.  Arty Baumgartner, [place] 05/01/2022,4:14 PM

## 2022-05-01 NOTE — Progress Notes (Signed)
Pt declined ambulation b/c he wants to sleep, asked me to come back later.   Reginald Clarke 05/01/2022 11:35 AM

## 2022-05-01 NOTE — Progress Notes (Signed)
Pt's PIV infiltrated again. Pt's has had multiple PIVs infiltrate in past few days. Reached out to Dr. Juleen China in ID to see if pt can have PICC line placed. Verbal order given by Dr. Juleen China to place PICC line. IV team consulted.

## 2022-05-01 NOTE — Progress Notes (Signed)
PROGRESS NOTE        PATIENT DETAILS Name: Reginald Clarke Age: 31 y.o. Sex: male Date of Birth: Nov 19, 1990 Admit Date: 02/25/2022 Admitting Physician Dorcas Carrow, MD VOZ:DGUYQIHK, [name], PA-C  Brief Summary: Patient is a 31 y.o.  male with history of recurrent MSSA native tricuspid valve endocarditis in spite of completing several courses of IV antibiotics.  Evaluated by ID/CT surgery-underwent tricuspid valve debridement/repair/annuloplasty on 9/19.  Significant events: 4/12-5/10>> hospitalization for MSSA bacteremia with native TV endocarditis-angio vac procedure on 4/26.Completed cefazolin on 5/10-planned weekly dalbavancin at the ID clinic.   5/22>> outpatient blood culture positive for MSSA 5/24-6/8>> hospitalization [facility] for persistent bacteremia-worsening back pain-discharge to [facility] on IV Ancef. 7/5>> discharged from [facility] on oral cefdinir.  Continued to have intermittent fever postdischarge.   7/24>> admit for-persistent fever/nausea/vomiting/diarrhea-blood cultures positive for MSSA. 9/5>> recurrent fever- blood cultures growing Staph aureus  9/5 >> PICC line discontinued 9/19>> debridement of tricuspid valve/valve reconstruction/annuloplasty 9/27>> transfer to [facility]  Significant studies: 7/25>> CT chest: Multifocal airspace opacities-increasing cavitary nodules 7/25>> MRI thoracic spine: No evidence of infection in thoracic spine.  Significant microbiology data: 7/24>> urine culture: Negative 7/25>> blood culture: 1/2-MSSA. 7/27>> blood culture: No growth 7/29>> blood culture: No growth 9/04>> blood cultures:MRSA 9/07>> blood cultures:neg 9/19>> tricuspid valve tissue culture: Staph aureus-sensitivities pending  Procedures: 8/3>> TEE: 3 vegetations on tricuspid valve-severe TR.  EF 60-65% 9/14>> multiple teeth extraction 9/19>> debridement of tricuspid valve/valve reconstruction/annuloplasty  Consults: ID CT surgery Dentist    Subjective:  No major issues overnight.  Objective: Vitals: Blood pressure (!) 116/54, pulse 62, temperature 98.4 F (36.9 C), temperature source Oral, resp. rate 17, height 5\' 10"  (1.778 m), weight 84.7 kg, SpO2 97 %.   Exam: Gen Exam:Alert awake-not in any distress HEENT:atraumatic, normocephalic Chest: B/L clear to auscultation anteriorly CVS:S1S2 regular Abdomen:soft non tender, non distended Extremities:no edema Neurology: Non focal Skin: no rash   Assessment/Plan: Sepsis due to recurrent MSSA bacteremia with native tricuspid valve endocarditis-s/p valve repair/annuloplasty on 9/9 Sepsis physiology resolved-on Teflaro/daptomycin-ID/CT surgery following.  Goal INR 2-3-we will ask pharmacy to dose Coumadin henceforth.  Normocytic anemia: Due to chronic disease/recurrent endocarditis: No indication for transfusion.  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  Mild transaminitis resolved  Nutrition Status: Nutrition Problem: Inadequate oral intake Etiology: vomiting, nausea Signs/Symptoms: per patient/family report Interventions: Ensure Enlive (each supplement provides 350kcal and 20 grams of protein), MVI, Snacks  BMI: Estimated body mass index is 26.8 kg/m as calculated from the following:   Height as of this encounter: 5\' 10"  (1.778 m).   Weight as of this encounter: 84.7 kg.   Code status:   Code Status: Full Code   DVT Prophylaxis: enoxaparin (LOVENOX) injection 40 mg Start: 04/25/22 2200 SCDs Start: 04/24/22 0836 SCDs Start: 04/23/22 1259 warfarin (COUMADIN) tablet 5 mg    Family Communication: Significant other at bedside  Disposition Plan: Status is: Inpatient   Diet: Diet Order             Diet Carb Modified Fluid consistency: Thin; Room service appropriate? Yes  Diet effective now                    MEDICATIONS: Scheduled Meds:  Ferrous  Fumarate  1 tablet Oral BID WC  And   vitamin B-12  100 mcg Oral BID WC   And   ascorbic acid  500 mg Oral BID WC   And   folic acid  1 mg Oral BID WC   aspirin EC  325 mg Oral Daily   Or   aspirin  324 mg Per Tube Daily   bisacodyl  10 mg Oral Daily   Or   bisacodyl  10 mg Rectal Daily   docusate sodium  100 mg Oral BID   enoxaparin (LOVENOX) injection  40 mg Subcutaneous QHS   escitalopram  10 mg Oral Daily   feeding supplement  237 mL Oral BID BM   gabapentin  300 mg Oral TID   methadone  135 mg Oral Daily   metoprolol tartrate  12.5 mg Per Tube BID   multivitamin with minerals  1 tablet Oral Daily   naproxen  250 mg Oral BID WC   nicotine  14 mg Transdermal Daily   pantoprazole  40 mg Oral Daily   warfarin  5 mg Oral q1600   Warfarin - Physician Dosing Inpatient   Does not apply q1600   Continuous Infusions:  ceFTAROline (TEFLARO) IV 600 mg (05/01/22 0720)   DAPTOmycin (CUBICIN) 800 mg in sodium chloride 0.9 % IVPB Stopped (05/01/22 1305)   promethazine (PHENERGAN) injection (IM or IVPB) 12.5 mg (04/29/22 0048)   PRN Meds:.acetaminophen, alum & mag hydroxide-simeth, bisacodyl, diclofenac Sodium, docusate sodium, hydrOXYzine, loperamide, methocarbamol, metoprolol tartrate, mouth rinse, oxyCODONE, polyethylene glycol, promethazine (PHENERGAN) injection (IM or IVPB), promethazine, sodium chloride flush   I have personally reviewed following labs and imaging studies  LABORATORY DATA: Recent Labs  Lab 04/25/22 0417 04/26/22 0404 04/27/22 0237 04/28/22 0724 04/30/22 0620  WBC 4.6 5.3 3.9* 3.3* 3.4*  HGB 7.6* 7.9* 7.2* 7.2* 8.3*  HCT 22.9* 24.0* 21.7* 22.8* 25.6*  PLT 49* 60* 76* 109* 146*  MCV 86.7 87.0 86.5 89.1 87.7  MCH 28.8 28.6 28.7 28.1 28.4  MCHC 33.2 32.9 33.2 31.6 32.4  RDW 15.5 15.7* 15.5 15.4 15.4     Recent Labs  Lab 04/24/22 1714 04/24/22 1714 04/25/22 0417 04/26/22 0404 04/27/22 0237 04/28/22 0724 04/29/22 0137 04/30/22 0620 05/01/22 0835   NA 137  --  136 139  --  139  --  137  --   K 4.0  --  4.1 4.1  --  4.3  --  4.4  --   CL 105  --  106 108  --  108  --  105  --   CO2 29  --  25 21*  --  24  --  26  --   GLUCOSE 105*  --  96 100*  --  87  --  86  --   BUN 18  --  18 16  --  17  --  16  --   CREATININE 0.82  --  0.67 0.76  --  0.68  --  0.85 0.89  CALCIUM 8.3*  --  8.3* 8.4*  --  8.6*  --  8.8*  --   AST  --   --  34  --   --   --   --   --   --   ALT  --   --  31  --   --   --   --   --   --   ALKPHOS  --   --  52  --   --   --   --   --   --  BILITOT  --   --  0.3  --   --   --   --   --   --   ALBUMIN  --   --  2.6*  --   --   --   --   --   --   MG 2.2  --  2.2  --   --   --   --   --   --   INR  --    < > 1.3* 1.3* 1.5* 1.5* 1.5* 1.8* 1.9*   < > = values in this interval not displayed.     MICROBIOLOGY: Recent Results (from the past 240 hour(s))  Aerobic/Anaerobic Culture w Gram Stain (surgical/deep wound)     Status: None (Preliminary result)   Collection Time: 04/23/22 10:36 AM   Specimen: Heart Valve Leaflet(s); Tissue  Result Value Ref Range Status   Specimen Description TISSUE  Final   Special Requests  VALVE LEAFLET  Final   Gram Stain   Final    RARE WBC PRESENT,BOTH PMN AND MONONUCLEAR NO ORGANISMS SEEN    Culture   Final    ABUNDANT STAPHYLOCOCCUS AUREUS CRITICAL RESULT CALLED TO, READ BACK BY AND VERIFIED WITH: RN KERA.B AT 1301 ON 04/24/2022 BY T.SAAD. NO ANAEROBES ISOLATED Sent to [place] for further susceptibility testing. NOTIFIED DR. SNIDER OF DELAY IN SUSCEPTIBILITY RESULTS. Performed at [facility], [place] [address], [place], [place] [address]    Report Status PENDING  Incomplete  Acid Fast Smear (AFB)     Status: None   Collection Time: 04/23/22 10:36 AM   Specimen: Heart Valve Leaflet(s); Tissue  Result Value Ref Range Status   AFB Specimen Processing Comment  Final    Comment: Tissue Grinding and Digestion/Decontamination   Acid Fast Smear Negative  Final    Comment:  (NOTE) Performed At: [facility] [place], [place] [id number] Rush Farmer MD [id number]    Source (AFB) TISSUE  Final    Comment: Performed at [facility], [place] [address] [place], [place] [address]  Susceptibility, Aer + Anaerob     Status: Abnormal   Collection Time: 04/23/22 10:36 AM  Result Value Ref Range Status   Suscept, Aer + Anaerob Preliminary report (A)  Final    Comment: (NOTE) Performed At: [facility] [place], [place] [id number] Rush Farmer MD [id number]    Source of Sample TISSUE HEART VALVE STAPH AUREUS  Final    Comment: Performed at [facility], [place] [address], [place], [place] [address]  Susceptibility, Aer + Anaerob     Status: Abnormal   Collection Time: 04/23/22 10:36 AM  Result Value Ref Range Status   Suscept, Aer + Anaerob Preliminary report (A)  Final    Comment: (NOTE) Performed At: [facility] [place], [place] [id number] Rush Farmer MD [id number]    Source of Sample [place] 2  Final    Comment: Performed at [facility], [place] [address], [place], [place] [address]  Min Inhibitory Conc (2 Drugs)     Status: None   Collection Time: 04/23/22 10:36 AM  Result Value Ref Range Status   Min Inhibitory Conc (2 Drugs) Preliminary report  Final    Comment: (NOTE) Performed At: [facility] [place], [place] [id number] Rush Farmer MD [id number]    Source ([place])   Final    TISSUE HEART  VALVE STAPH AUREUS NO 1 DAPTOMYCIN AND CEFTAROLINE    Comment: Performed at [facility], [address], [place], [place] [address]  Min Inhibitory Conc (2 Drugs)     Status: None   Collection Time: 04/23/22 10:36 AM  Result Value Ref Range Status   Min Inhibitory Conc (2 Drugs) Preliminary report  Final    Comment: (NOTE) Performed At: [facility] [address] [place], [place]  [id number] Jolene Schimke MD [id number]    Source (MIC2)   Final    TISSUE HEART VALVE STAPH AUREUS NO 2 DAPTOMYCIN AND CEFTAROLINE    Comment: Performed at [facility], [address], [place], [place] [address]  Susceptibility Result     Status: Abnormal   Collection Time: 04/23/22 10:36 AM  Result Value Ref Range Status   Suscept Result 1 Staphylococcus aureus (A)  Final    Comment: (NOTE) Identification performed by account, not confirmed by this laboratory. Performed At: [facility] [address] [place], [place] [id number] Jolene Schimke MD [id number]   Susceptibility Result     Status: Abnormal   Collection Time: 04/23/22 10:36 AM  Result Value Ref Range Status   Suscept Result 1 Staphylococcus aureus (A)  Final    Comment: (NOTE) Identification performed by account, not confirmed by this laboratory. Performed At: [facility] [address] [place], [place] [id number] Jolene Schimke MD [id number]   MIC Results (2 Drugs)     Status: None   Collection Time: 04/23/22 10:36 AM  Result Value Ref Range Status   RESULT 5 MIC (RESULT 1)                Base Name: Staphylococcus aureus  Final    Comment: (NOTE) Identification performed by account, not confirmed by this laboratory. Performed At: [facility] [address] [place], [place] [id number] Jolene Schimke MD [id number]   MIC Results (2 Drugs)     Status: None   Collection Time: 04/23/22 10:36 AM  Result Value Ref Range Status   RESULT 5 MIC (RESULT 1)                Base Name: Staphylococcus aureus  Final    Comment: (NOTE) Identification performed by account, not confirmed by this laboratory. Performed At: [facility] [address] [place], [place] [id number] Jolene Schimke MD [id number]      RADIOLOGY STUDIES/RESULTS: [place] EKG SITE RITE  Result Date: 05/01/2022 If Site Rite image not attached, placement could not be confirmed due to current  cardiac rhythm.    LOS: 65 days   Signature  Jeoffrey Massed M.D on 05/01/2022 at 2:36 PM   -  To page go to [email or web address]

## 2022-05-01 NOTE — Progress Notes (Signed)
Peripherally Inserted Central Catheter Placement  The IV Nurse has discussed with the patient and/or persons authorized to consent for the patient, the purpose of this procedure and the potential benefits and risks involved with this procedure.  The benefits include less needle sticks, lab draws from the catheter, and the patient may be discharged home with the catheter. Risks include, but not limited to, infection, bleeding, blood clot (thrombus formation), and puncture of an artery; nerve damage and irregular heartbeat and possibility to perform a PICC exchange if needed/ordered by physician.  Alternatives to this procedure were also discussed.  Bard Power PICC patient education guide, fact sheet on infection prevention and patient information card has been provided to patient /or left at bedside.    PICC Placement Documentation  PICC Single Lumen 61/44/31 Right Basilic 37 cm 0 cm (Active)  Indication for Insertion or Continuance of Line Prolonged intravenous therapies 05/01/22 1738  Exposed Catheter (cm) 0 cm 05/01/22 1738  Site Assessment Clean, Dry, Intact 05/01/22 1738  Line Status Flushed;Saline locked;Blood return noted 05/01/22 1738  Dressing Type Transparent;Securing device 05/01/22 1738  Dressing Status Antimicrobial disc in place;Clean, Dry, Intact 05/01/22 1738  Safety Lock Not Applicable 54/00/86 7619  Line Care Connections checked and tightened 05/01/22 1738  Line Adjustment (NICU/IV Team Only) No 05/01/22 1738  Dressing Intervention New dressing;Other (Comment) 05/01/22 1738  Dressing Change Due 05/08/22 05/01/22 1738       Enos Fling 05/01/2022, 5:40 PM

## 2022-05-01 NOTE — TOC Progression Note (Signed)
Transition of Care [facility]) - Progression Note    Patient Details  Name: Reginald Clarke MRN: [id number] Date of Birth: June 18, 1991  [facility]) CM/SW Protection, RN Phone Number: 05/01/2022, 8:49 AM  Clinical Narrative:    TOC continue to follow patient for needs.   Expected Discharge Plan: Home/Self Care Barriers to Discharge: Continued Medical Work up  Expected Discharge Plan and Services Expected Discharge Plan: Home/Self Care   Discharge Planning Services: CM Consult   Living arrangements for the past 2 months: Single Family Home                 DME Arranged: N/A         HH Arranged: NA           Social Determinants of Health (SDOH) Interventions    Readmission Risk Interventions    02/26/2022   10:07 AM  Readmission Risk Prevention Plan  Transportation Screening Complete  PCP or Specialist Appt within 3-5 Days Complete  HRI or Home Care Consult Complete  Palliative Care Screening Not Applicable

## 2022-05-02 DIAGNOSIS — F419 Anxiety disorder, unspecified: Secondary | ICD-10-CM | POA: Diagnosis not present

## 2022-05-02 DIAGNOSIS — I079 Rheumatic tricuspid valve disease, unspecified: Secondary | ICD-10-CM | POA: Diagnosis not present

## 2022-05-02 DIAGNOSIS — G894 Chronic pain syndrome: Secondary | ICD-10-CM | POA: Diagnosis not present

## 2022-05-02 LAB — SUSCEPTIBILITY RESULT

## 2022-05-02 LAB — BASIC METABOLIC PANEL
Anion gap: 6 (ref 5–15)
BUN: 20 mg/dL (ref 6–20)
CO2: 28 mmol/L (ref 22–32)
Calcium: 9 mg/dL (ref 8.9–10.3)
Chloride: 103 mmol/L (ref 98–111)
Creatinine, Ser: 0.85 mg/dL (ref 0.61–1.24)
GFR, Estimated: >60 mL/min (ref >60)
Glucose, Bld: 95 mg/dL (ref 70–99)
Potassium: 4.2 mmol/L (ref 3.5–5.1)
Sodium: 137 mmol/L (ref 135–145)

## 2022-05-02 LAB — PROTIME-INR
INR: 2 — ABNORMAL HIGH (ref 0.8–1.2)
Prothrombin Time: 22.5 seconds — ABNORMAL HIGH (ref 11.4–15.2)

## 2022-05-02 LAB — CBC
HCT: 25.5 % — ABNORMAL LOW (ref 39.0–52.0)
Hemoglobin: 8 g/dL — ABNORMAL LOW (ref 13.0–17.0)
MCH: 28 pg (ref 26.0–34.0)
MCHC: 31.4 g/dL (ref 30.0–36.0)
MCV: 89.2 fL (ref 80.0–100.0)
Platelets: 162 10*3/uL (ref 150–400)
RBC: 2.86 MIL/uL — ABNORMAL LOW (ref 4.22–5.81)
RDW: 15.2 % (ref 11.5–15.5)
WBC: 4.4 10*3/uL (ref 4.0–10.5)
nRBC: 0 % (ref 0.0–0.2)

## 2022-05-02 LAB — SUSCEPTIBILITY, AER + ANAEROB

## 2022-05-02 LAB — CK: Total CK: 17 U/L — ABNORMAL LOW (ref 49–397)

## 2022-05-02 MED ORDER — ASPIRIN 81 MG PO TBEC
81.0000 mg | DELAYED_RELEASE_TABLET | Freq: Every day | ORAL | Status: DC
  Administered 2022-05-03 – 2022-06-04 (×33): 81 mg via ORAL
  Filled 2022-05-02 (×33): qty 1

## 2022-05-02 NOTE — Progress Notes (Signed)
ANTICOAGULATION CONSULT NOTE - Follow Up Consult  Pharmacy Consult for Warfarin Indication:  tricuspid valve repair  No Known Allergies  Patient Measurements: Height: 5\' 10"  (177.8 cm) Weight: 84.1 kg (185 lb 6.5 oz) IBW/kg (Calculated) : 73  Vital Signs: Temp: 98 F (36.7 C) (09/28 1238) Temp Source: Oral (09/28 1238) BP: 102/52 (09/28 1238) Pulse Rate: 60 (09/28 1238)  Labs: Recent Labs    04/30/22 0620 05/01/22 0835 05/02/22 0317  HGB 8.3*  --  8.0*  HCT 25.6*  --  25.5*  PLT 146*  --  162  LABPROT 20.4* 21.5* 22.5*  INR 1.8* 1.9* 2.0*  CREATININE 0.85 0.89 0.85  CKTOTAL  --   --  17*    Estimated Creatinine Clearance: 130 mL/min (by C-G formula based on SCr of 0.85 mg/dL).  Assessment:  31 yr old male s/p tricuspid valve repair on 04/23/22.  Warfarin initiated POD# 1 with 2.5 mg daily along with Enoxaparin 40 mg SQ q24h.  INR 1.3>>1.5 after 2.5 mg daily x 4 doses and increased to 5 mg daily on 9/24. INR up to 2.0 today. Just at goal range.  Also on Aspirin 325 mg daily, Naproxen 250 mg PO BID with meals, and Trinsicon BID (using components due to backorder).     Hemoglobin 7s > 8.3 >8.0 post-op.     Platelet count down to ~50 post-op, but trended back to 162K today.   No bleeding reported.  Goal of Therapy:  INR 2-3 Monitor platelets by anticoagulation protocol: Yes   Plan:  Continue warfarin 5 mg daily. Daily PT/INR. Enoxaparin stopped with INR at low goal. Aspirin decreased to 81 mg daily and Naproxen 250 mg PO BID discontinued, per d/w Evonnie Pat, PA with [facility].  Arty Baumgartner, [place] 05/02/2022,1:04 PM

## 2022-05-02 NOTE — Progress Notes (Signed)
PROGRESS NOTE        PATIENT DETAILS Name: Reginald Clarke Age: 31 y.o. Sex: male Date of Birth: 08/11/1990 Admit Date: 02/25/2022 Admitting Physician Dorcas Carrow, MD XLK:[id number], [name], PA-C  Brief Summary: Patient is a 31 y.o.  male with history of recurrent MSSA native tricuspid valve endocarditis in spite of completing several courses of IV antibiotics.  Evaluated by ID/CT surgery-underwent tricuspid valve debridement/repair/annuloplasty on 9/19.  Significant events: 4/12-5/10>> hospitalization for MSSA bacteremia with native TV endocarditis-angio vac procedure on 4/26.Completed cefazolin on 5/10-planned weekly dalbavancin at the ID clinic.   5/22>> outpatient blood culture positive for MSSA 5/24-6/8>> hospitalization [facility] for persistent bacteremia-worsening back pain-discharge to [facility] on IV Ancef. 7/5>> discharged from [facility] on oral cefdinir.  Continued to have intermittent fever postdischarge.   7/24>> admit for-persistent fever/nausea/vomiting/diarrhea-blood cultures positive for MSSA. 9/5>> recurrent fever- blood cultures growing Staph aureus  9/5 >> PICC line discontinued 9/19>> debridement of tricuspid valve/valve reconstruction/annuloplasty 9/27>> transfer to [facility]  Significant studies: 7/25>> CT chest: Multifocal airspace opacities-increasing cavitary nodules 7/25>> MRI thoracic spine: No evidence of infection in thoracic spine.  Significant microbiology data: 7/24>> urine culture: Negative 7/25>> blood culture: 1/2-MSSA. 7/27>> blood culture: No growth 7/29>> blood culture: No growth 9/04>> blood cultures:MRSA 9/07>> blood cultures:neg 9/19>> tricuspid valve tissue culture: Staph aureus-sensitivities pending  Procedures: 8/3>> TEE: 3 vegetations on tricuspid valve-severe TR.  EF 60-65% 9/14>> multiple teeth extraction 9/19>> debridement of tricuspid valve/valve reconstruction/annuloplasty  Consults: ID CT surgery Dentist    Subjective:  No major issues overnight.  Pain is well controlled on methadone.  Objective: Vitals: Blood pressure (!) 106/46, pulse 65, temperature 98.2 F (36.8 C), resp. rate 18, height 5\' 10"  (1.778 m), weight 84.1 kg, SpO2 97 %.   Exam: Gen Exam:Alert awake-not in any distress HEENT:atraumatic, normocephalic Chest: B/L clear to auscultation anteriorly CVS:S1S2 regular Abdomen:soft non tender, non distended Extremities:no edema Neurology: Non focal Skin: no rash   Assessment/Plan: Sepsis due to recurrent MSSA bacteremia with native tricuspid valve endocarditis-s/p valve repair/annuloplasty on 9/9 Sepsis physiology resolved-discussed with infectious disease MD today Dr. 11/9 6 weeks of Teflaro/daptomycin from 9/19-this will unfortunately have to be done as an inpatient.  Goal INR 2-3-pharmacy now managing Coumadin.    Normocytic anemia: Due to chronic disease/recurrent endocarditis: No indication for transfusion.  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  Mild transaminitis resolved  Nutrition Status: Nutrition Problem: Inadequate oral intake Etiology: vomiting, nausea Signs/Symptoms: per patient/family report Interventions: Ensure Enlive (each supplement provides 350kcal and 20 grams of protein), MVI, Snacks  BMI: Estimated body mass index is 26.6 kg/m as calculated from the following:   Height as of this encounter: 5\' 10"  (1.778 m).   Weight as of this encounter: 84.1 kg.   Code status:   Code Status: Full Code   DVT Prophylaxis: SCDs Start: 04/24/22 0836 SCDs Start: 04/23/22 1259 warfarin (COUMADIN) tablet 5 mg    Family Communication: Significant other at bedside  Disposition Plan: Status is: Inpatient   Diet: Diet Order             Diet Carb Modified Fluid consistency: Thin; Room service appropriate? Yes  Diet effective now                     MEDICATIONS:  Scheduled Meds:  Ferrous Fumarate  1 tablet Oral BID WC   And   vitamin B-12  100 mcg Oral BID WC   And   ascorbic acid  500 mg Oral BID WC   And   folic acid  1 mg Oral BID WC   [START ON 05/03/2022] aspirin EC  81 mg Oral Daily   bisacodyl  10 mg Oral Daily   Or   bisacodyl  10 mg Rectal Daily   Chlorhexidine Gluconate Cloth  6 each Topical Daily   docusate sodium  100 mg Oral BID   escitalopram  10 mg Oral Daily   feeding supplement  237 mL Oral BID BM   gabapentin  300 mg Oral TID   methadone  135 mg Oral Daily   metoprolol tartrate  12.5 mg Per Tube BID   multivitamin with minerals  1 tablet Oral Daily   nicotine  14 mg Transdermal Daily   pantoprazole  40 mg Oral Daily   sodium chloride flush  10-40 mL Intracatheter Q12H   warfarin  5 mg Oral q1600   Warfarin - Pharmacist Dosing Inpatient   Does not apply q1600   Continuous Infusions:  ceFTAROline (TEFLARO) IV 600 mg (05/02/22 0455)   DAPTOmycin (CUBICIN) 800 mg in sodium chloride 0.9 % IVPB 800 mg (05/02/22 1152)   PRN Meds:.acetaminophen, alum & mag hydroxide-simeth, bisacodyl, diclofenac Sodium, docusate sodium, hydrOXYzine, loperamide, methocarbamol, metoprolol tartrate, mouth rinse, oxyCODONE, polyethylene glycol, promethazine, sodium chloride flush, sodium chloride flush   I have personally reviewed following labs and imaging studies  LABORATORY DATA: Recent Labs  Lab 04/26/22 0404 04/27/22 0237 04/28/22 0724 04/30/22 0620 05/02/22 0317  WBC 5.3 3.9* 3.3* 3.4* 4.4  HGB 7.9* 7.2* 7.2* 8.3* 8.0*  HCT 24.0* 21.7* 22.8* 25.6* 25.5*  PLT 60* 76* 109* 146* 162  MCV 87.0 86.5 89.1 87.7 89.2  MCH 28.6 28.7 28.1 28.4 28.0  MCHC 32.9 33.2 31.6 32.4 31.4  RDW 15.7* 15.5 15.4 15.4 15.2     Recent Labs  Lab 04/26/22 0404 04/27/22 0237 04/28/22 0724 04/29/22 0137 04/30/22 0620 05/01/22 0835 05/02/22 0317  NA 139  --  139  --  137  --  137  K 4.1  --  4.3  --  4.4  --  4.2  CL 108  --   108  --  105  --  103  CO2 21*  --  24  --  26  --  28  GLUCOSE 100*  --  87  --  86  --  95  BUN 16  --  17  --  16  --  20  CREATININE 0.76  --  0.68  --  0.85 0.89 0.85  CALCIUM 8.4*  --  8.6*  --  8.8*  --  9.0  INR 1.3*   < > 1.5* 1.5* 1.8* 1.9* 2.0*   < > = values in this interval not displayed.     MICROBIOLOGY: Recent Results (from the past 240 hour(s))  Fungus Culture With Stain     Status: None (Preliminary result)   Collection Time: 04/23/22 10:36 AM   Specimen: Heart Valve Leaflet(s); Tissue  Result Value Ref Range Status   Fungus Stain Final report  Final    Comment: (NOTE) Performed At: [facility] [address] [place], [place] [id number] Jolene Schimke MD [id number]    Fungus (Mycology) Culture PENDING  Incomplete   Fungal Source TISSUE  Final    Comment:  Performed at [facility], [address] [place], [place] [address]  Aerobic/Anaerobic Culture w Gram Stain (surgical/deep wound)     Status: None (Preliminary result)   Collection Time: 04/23/22 10:36 AM   Specimen: Heart Valve Leaflet(s); Tissue  Result Value Ref Range Status   Specimen Description TISSUE  Final   Special Requests  VALVE LEAFLET  Final   Gram Stain   Final    RARE WBC PRESENT,BOTH PMN AND MONONUCLEAR NO ORGANISMS SEEN    Culture   Final    ABUNDANT STAPHYLOCOCCUS AUREUS CRITICAL RESULT CALLED TO, READ BACK BY AND VERIFIED WITH: RN KERA.B AT 1301 ON 04/24/2022 BY T.SAAD. NO ANAEROBES ISOLATED Sent to [facility] for further susceptibility testing. NOTIFIED DR. SNIDER OF DELAY IN SUSCEPTIBILITY RESULTS. Performed at [facility], [address], [place], [place] [address]    Report Status PENDING  Incomplete  Acid Fast Smear (AFB)     Status: None   Collection Time: 04/23/22 10:36 AM   Specimen: Heart Valve Leaflet(s); Tissue  Result Value Ref Range Status   AFB Specimen Processing Comment  Final    Comment: Tissue Grinding and Digestion/Decontamination   Acid  Fast Smear Negative  Final    Comment: (NOTE) Performed At: [facility] [address] [place], [place] [id number] Jolene Schimke MD [id number]    Source (AFB) TISSUE  Final    Comment: Performed at [facility], [address] [place], [place] [address]  Susceptibility, Aer + Anaerob     Status: Abnormal   Collection Time: 04/23/22 10:36 AM  Result Value Ref Range Status   Suscept, Aer + Anaerob Final report (A)  Corrected    Comment: (NOTE) Performed At: [facility] [address] [place], [place] [id number] Jolene Schimke MD [id number] CORRECTED ON 09/28 AT 3762: PREVIOUSLY REPORTED AS Preliminary report    Source of Sample TISSUE HEART VALVE STAPH AUREUS  Final    Comment: Performed at [facility], [address] [place], [place] [address]  Susceptibility, Aer + Anaerob     Status: Abnormal   Collection Time: 04/23/22 10:36 AM  Result Value Ref Range Status   Suscept, Aer + Anaerob Final report (A)  Corrected    Comment: (NOTE) Performed At: [facility] [address] [place], [place] [id number] Jolene Schimke MD [id number] CORRECTED ON 09/28 AT 0350: PREVIOUSLY REPORTED AS Preliminary report    Source of Sample TISSUE HEART VALVE STAPH AUREUS ISOLATE 2  Final    Comment: Performed at [facility], [address], [place], [place] [address]  Min Inhibitory Conc (2 Drugs)     Status: None   Collection Time: 04/23/22 10:36 AM  Result Value Ref Range Status   Min Inhibitory Conc (2 Drugs) Preliminary report  Final    Comment: (NOTE) Performed At: [facility] [address] [place], [place] [id number] Jolene Schimke MD [id number]    Source (MIC2)   Final    TISSUE HEART VALVE STAPH AUREUS NO 1 DAPTOMYCIN AND CEFTAROLINE    Comment: Performed at [facility], [address] [place], [place] [address]  Min Inhibitory Conc (2 Drugs)     Status: None   Collection Time: 04/23/22 10:36 AM  Result Value  Ref Range Status   Min Inhibitory Conc (2 Drugs) Preliminary report  Final    Comment: (NOTE) Performed At: [facility] [address] [place], [place] [id number] Jolene Schimke MD [id number]  Source (MIC2)   Final    TISSUE HEART VALVE STAPH AUREUS NO 2 DAPTOMYCIN AND CEFTAROLINE    Comment: Performed at [facility], [address] [place], [place] [id number]  Susceptibility Result     Status: Abnormal   Collection Time: 04/23/22 10:36 AM  Result Value Ref Range Status   Suscept Result 1 Staphylococcus aureus (A)  Final    Comment: (NOTE) Identification performed by account, not confirmed by this laboratory. Based on susceptibility to oxacillin this isolate would be susceptible to: *Penicillinase-stable penicillins, such as:  Cloxacillin, Dicloxacillin, Nafcillin *Beta-lactam combination agents, such as:  Amoxicillin-clavulanic acid, Ampicillin-sulbactam,  Piperacillin-tazobactam *Oral cephems, such as:  Cefaclor, Cefdinir, Cefpodoxime, Cefprozil, Cefuroxime,  Cephalexin, Loracarbef *Parenteral cephems, such as:  Cefazolin, Cefepime, Cefotaxime, Cefotetan, Ceftaroline,  Ceftizoxime, Ceftriaxone, Cefuroxime *Carbapenems, such as:  Doripenem, Ertapenem, Imipenem, Meropenem    Antimicrobial Suscept Comment  Corrected    Comment: (NOTE)      ** S = Susceptible; I = Intermediate; R = Resistant **                   P = Positive; N = Negative            MICS are expressed in micrograms per mL   Antibiotic                 RSLT#1    RSLT#2    RSLT#3    RSLT#4 Ciprofloxacin                  S Clindamycin                    S Erythromycin                   R Gentamicin                     S Levofloxacin                   S Linezolid                      S Oxacillin                      S Penicillin                     R Rifampin                       S Tetracycline                   S Trimethoprim/Sulfa             S Vancomycin                      S Performed At: [facility] [address] [place], [place] [id number] Jolene SchimkeNagendra Sanjai MD [id number]   Susceptibility Result     Status: Abnormal   Collection Time: 04/23/22 10:36 AM  Result Value Ref Range Status   Suscept Result 1 Staphylococcus aureus (A)  Final    Comment: (NOTE) Identification performed by account, not confirmed by this laboratory. Based on susceptibility to oxacillin this isolate would be susceptible to: *Penicillinase-stable penicillins, such as:  Cloxacillin, Dicloxacillin, Nafcillin *Beta-lactam combination agents, such as:  Amoxicillin-clavulanic acid, Ampicillin-sulbactam,  Piperacillin-tazobactam *Oral cephems, such as:  Cefaclor, Cefdinir, Cefpodoxime,  Cefprozil, Cefuroxime,  Cephalexin, Loracarbef *Parenteral cephems, such as:  Cefazolin, Cefepime, Cefotaxime, Cefotetan, Ceftaroline,  Ceftizoxime, Ceftriaxone, Cefuroxime *Carbapenems, such as:  Doripenem, Ertapenem, Imipenem, Meropenem    Antimicrobial Suscept Comment  Corrected    Comment: (NOTE)      ** S = Susceptible; I = Intermediate; R = Resistant **                   P = Positive; N = Negative            MICS are expressed in micrograms per mL   Antibiotic                 RSLT#1    RSLT#2    RSLT#3    RSLT#4 Ciprofloxacin                  S Clindamycin                    S Erythromycin                   R Gentamicin                     S Levofloxacin                   S Linezolid                      S Oxacillin                      S Penicillin                     R Rifampin                       S Tetracycline                   S Trimethoprim/Sulfa             S Vancomycin                     S Performed At: [facility] [address] [place], [place] [id number] Jolene Schimke MD [id number]:[id number]   MIC Results (2 Drugs)     Status: None   Collection Time: 04/23/22 10:36 AM  Result Value Ref Range Status   RESULT 5 MIC (RESULT 1)                Base Name:  Staphylococcus aureus  Final    Comment: (NOTE) Identification performed by account, not confirmed by this laboratory. Performed At: [facility] [address] [place], [place] [id number] Jolene Schimke MD [id number]   MIC Results (2 Drugs)     Status: None   Collection Time: 04/23/22 10:36 AM  Result Value Ref Range Status   RESULT 5 MIC (RESULT 1)                Base Name: Staphylococcus aureus  Final    Comment: (NOTE) Identification performed by account, not confirmed by this laboratory. Performed At: [facility] [address] [place], [place] [id number] Jolene Schimke MD [id number]   Fungus Culture Result     Status: None   Collection Time: 04/23/22 10:36 AM  Result Value Ref Range Status   Result 1 Comment  Final    Comment: (NOTE) KOH/Calcofluor preparation:  no  fungus observed. Performed At: [facility] [place], [place] [id number] Rush Farmer MD [id number]      RADIOLOGY STUDIES/RESULTS: [place] EKG SITE RITE  Result Date: 05/01/2022 If Site Rite image not attached, placement could not be confirmed due to current cardiac rhythm.  [place] EKG SITE RITE  Result Date: 05/01/2022 If Site Rite image not attached, placement could not be confirmed due to current cardiac rhythm.    LOS: 33 days   Signature  Oren Binet M.D on 05/02/2022 at 12:27 PM   -  To page go to [email or web address]

## 2022-05-02 NOTE — Progress Notes (Signed)
Brief ID note  Updated by nursing staff yesterday that patient was exhausting his peripheral IV access and request was made for PICC line which was placed yesterday.  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  His valve tissue isolates have resulted with MSSA.  For now, we will continue with daptomycin and ceftaroline for his complicated MSSA/MRSA tricuspid valve endocarditis.  Discussed with [facility] today and will plan for duration of therapy to be completed as an inpatient.   Raynelle Highland for Infectious Disease [facility] 05/02/2022, 11:18 AM

## 2022-05-02 NOTE — Progress Notes (Signed)
This chaplain is present for F/U spiritual care.   The Pt. and chaplain celebrated meeting at the ice machine for today's visit.  The chaplain understands the Pt. respects his journey and is thinking about where his wisdom will take him in the future. The chaplain listened to updates on [name] and Nanny before stepping away for the Pt. to eat his breakfast.  This chaplain is available for F/U spiritual care as needed.  Chaplain Sallyanne Kuster [phone]

## 2022-05-03 DIAGNOSIS — I079 Rheumatic tricuspid valve disease, unspecified: Secondary | ICD-10-CM | POA: Diagnosis not present

## 2022-05-03 DIAGNOSIS — I330 Acute and subacute infective endocarditis: Secondary | ICD-10-CM | POA: Diagnosis not present

## 2022-05-03 DIAGNOSIS — R7881 Bacteremia: Secondary | ICD-10-CM | POA: Diagnosis not present

## 2022-05-03 DIAGNOSIS — B9561 Methicillin susceptible Staphylococcus aureus infection as the cause of diseases classified elsewhere: Secondary | ICD-10-CM | POA: Diagnosis not present

## 2022-05-03 DIAGNOSIS — B9562 Methicillin resistant Staphylococcus aureus infection as the cause of diseases classified elsewhere: Secondary | ICD-10-CM | POA: Diagnosis not present

## 2022-05-03 LAB — PROTIME-INR
INR: 2.3 — ABNORMAL HIGH (ref 0.8–1.2)
Prothrombin Time: 25.4 seconds — ABNORMAL HIGH (ref 11.4–15.2)

## 2022-05-03 LAB — MIN INHIBITORY CONC (2 DRUGS)

## 2022-05-03 LAB — MIC RESULTS (2 DRUGS)

## 2022-05-03 MED ORDER — SODIUM CHLORIDE 0.9 % IV SOLN
800.0000 mg | Freq: Every day | INTRAVENOUS | Status: AC
  Administered 2022-05-03 – 2022-06-04 (×33): 800 mg via INTRAVENOUS
  Filled 2022-05-03 (×33): qty 16

## 2022-05-03 MED ORDER — SODIUM CHLORIDE 0.9 % IV SOLN
600.0000 mg | Freq: Three times a day (TID) | INTRAVENOUS | Status: AC
  Administered 2022-05-03 – 2022-06-04 (×96): 600 mg via INTRAVENOUS
  Filled 2022-05-03 (×102): qty 20

## 2022-05-03 NOTE — Progress Notes (Signed)
PROGRESS NOTE        PATIENT DETAILS Name: Reginald Clarke Age: 31 y.o. Sex: male Date of Birth: [birth year] Admit Date: 02/25/2022 Admitting Physician Dorcas Carrow, MD [id number], [name], PA-C  Brief Summary: Patient is a 31 y.o.  male with history of recurrent MSSA native tricuspid valve endocarditis in spite of completing several courses of IV antibiotics.  Evaluated by ID/CT surgery-underwent tricuspid valve debridement/repair/annuloplasty on 9/19.  Significant events: 4/12-5/10>> hospitalization for MSSA bacteremia with native TV endocarditis-angio vac procedure on 4/26.Completed cefazolin on 5/10-planned weekly dalbavancin at the ID clinic.   5/22>> outpatient blood culture positive for MSSA 5/24-6/8>> hospitalization [facility] for persistent bacteremia-worsening back pain-discharge to [facility] on IV Ancef. 7/5>> discharged from [facility] on oral cefdinir.  Continued to have intermittent fever postdischarge.   7/24>> admit for-persistent fever/nausea/vomiting/diarrhea-blood cultures positive for MSSA. 9/5>> recurrent fever- blood cultures growing Staph aureus  9/5 >> PICC line discontinued 9/19>> debridement of tricuspid valve/valve reconstruction/annuloplasty 9/27>> transfer to [facility]  Significant studies: 7/25>> CT chest: Multifocal airspace opacities-increasing cavitary nodules 7/25>> MRI thoracic spine: No evidence of infection in thoracic spine.  Significant microbiology data: 7/24>> urine culture: Negative 7/25>> blood culture: 1/2-MSSA. 7/27>> blood culture: No growth 7/29>> blood culture: No growth 9/04>> blood cultures:MRSA 9/07>> blood cultures:neg 9/19>> tricuspid valve tissue culture: Staph aureus-sensitivities pending  Procedures: 8/3>> TEE: 3 vegetations on tricuspid valve-severe TR.  EF 60-65% 9/14>> multiple teeth extraction 9/19>> debridement of tricuspid valve/valve reconstruction/annuloplasty  Consults: ID CT surgery Dentist    Subjective:  No major issues overnight.  Understands that he will remain inpatient for 6 weeks from 9/19 to complete IV antibiotics.  Objective: Vitals: Blood pressure (!) 100/47, pulse (!) 52, temperature 98.6 F (37 C), temperature source Oral, resp. rate 14, height 5\' 10"  (1.778 m), weight 82.9 kg, SpO2 97 %.   Exam: Gen Exam:Alert awake-not in any distress HEENT:atraumatic, normocephalic Chest: B/L clear to auscultation anteriorly CVS:S1S2 regular Abdomen:soft non tender, non distended Extremities:no edema Neurology: Non focal Skin: no rash   Assessment/Plan: Sepsis due to recurrent MSSA bacteremia with native tricuspid valve endocarditis-s/p valve repair/annuloplasty on 9/9 Sepsis physiology resolved-discussed with infectious disease MD today Dr. 11/9 6 weeks of Teflaro/daptomycin from 9/19-this will unfortunately have to be done as an inpatient.  Goal INR 2-3-pharmacy now managing Coumadin.    Normocytic anemia: Due to chronic disease/recurrent endocarditis: No indication for transfusion.  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  Mild transaminitis resolved  Nutrition Status: Nutrition Problem: Inadequate oral intake Etiology: vomiting, nausea Signs/Symptoms: per patient/family report Interventions: Ensure Enlive (each supplement provides 350kcal and 20 grams of protein), MVI, Snacks  BMI: Estimated body mass index is 26.22 kg/m as calculated from the following:   Height as of this encounter: 5\' 10"  (1.778 m).   Weight as of this encounter: 82.9 kg.   Code status:   Code Status: Full Code   DVT Prophylaxis: SCDs Start: 04/24/22 0836 SCDs Start: 04/23/22 1259 warfarin (COUMADIN) tablet 5 mg    Family Communication: Significant other at bedside  Disposition Plan: Status is: Inpatient   Diet: Diet Order             DIET DYS 3 Room service  appropriate? Yes; Fluid consistency: Thin  Diet effective now  MEDICATIONS: Scheduled Meds:  Ferrous Fumarate  1 tablet Oral BID WC   And   vitamin B-12  100 mcg Oral BID WC   And   ascorbic acid  500 mg Oral BID WC   And   folic acid  1 mg Oral BID WC   aspirin EC  81 mg Oral Daily   bisacodyl  10 mg Oral Daily   Or   bisacodyl  10 mg Rectal Daily   Chlorhexidine Gluconate Cloth  6 each Topical Daily   docusate sodium  100 mg Oral BID   escitalopram  10 mg Oral Daily   feeding supplement  237 mL Oral BID BM   gabapentin  300 mg Oral TID   methadone  135 mg Oral Daily   metoprolol tartrate  12.5 mg Per Tube BID   multivitamin with minerals  1 tablet Oral Daily   nicotine  14 mg Transdermal Daily   pantoprazole  40 mg Oral Daily   sodium chloride flush  10-40 mL Intracatheter Q12H   warfarin  5 mg Oral q1600   Warfarin - Pharmacist Dosing Inpatient   Does not apply q1600   Continuous Infusions:  ceFTAROline (TEFLARO) IV 600 mg (05/03/22 1257)   DAPTOmycin (CUBICIN) 800 mg in sodium chloride 0.9 % IVPB     PRN Meds:.acetaminophen, alum & mag hydroxide-simeth, bisacodyl, diclofenac Sodium, docusate sodium, hydrOXYzine, loperamide, methocarbamol, metoprolol tartrate, mouth rinse, oxyCODONE, polyethylene glycol, promethazine, sodium chloride flush, sodium chloride flush   I have personally reviewed following labs and imaging studies  LABORATORY DATA: Recent Labs  Lab 04/27/22 0237 04/28/22 0724 04/30/22 0620 05/02/22 0317  WBC 3.9* 3.3* 3.4* 4.4  HGB 7.2* 7.2* 8.3* 8.0*  HCT 21.7* 22.8* 25.6* 25.5*  PLT 76* 109* 146* 162  MCV 86.5 89.1 87.7 89.2  MCH 28.7 28.1 28.4 28.0  MCHC 33.2 31.6 32.4 31.4  RDW 15.5 15.4 15.4 15.2     Recent Labs  Lab 04/28/22 0724 04/29/22 0137 04/30/22 0620 05/01/22 0835 05/02/22 0317 05/03/22 0359  NA 139  --  137  --  137  --   K 4.3  --  4.4  --  4.2  --   CL 108  --  105  --  103  --   CO2 24  --  26   --  28  --   GLUCOSE 87  --  86  --  95  --   BUN 17  --  16  --  20  --   CREATININE 0.68  --  0.85 0.89 0.85  --   CALCIUM 8.6*  --  8.8*  --  9.0  --   INR 1.5* 1.5* 1.8* 1.9* 2.0* 2.3*     MICROBIOLOGY: No results found for this or any previous visit (from the past 240 hour(s)).    RADIOLOGY STUDIES/RESULTS: [place] EKG SITE RITE  Result Date: 05/01/2022 If Site Rite image not attached, placement could not be confirmed due to current cardiac rhythm.  [place] EKG SITE RITE  Result Date: 05/01/2022 If Site Rite image not attached, placement could not be confirmed due to current cardiac rhythm.    LOS: 29 days   Signature  Oren Binet M.D on 05/03/2022 at 1:44 PM   -  To page go to [email or web address]

## 2022-05-03 NOTE — Progress Notes (Signed)
[facility]  Date of Admission:  02/25/2022           Reason for visit: Follow up on tricuspid valve endocarditis  Current antibiotics: Daptomycin Ceftaroline    ASSESSMENT:    31 y.o. male admitted with:  #MSSA/MRSA tricuspid valve endocarditis Status post repair with tricuspid annuloplasty ring on 04/23/2022 with CT surgery.  Currently on daptomycin and ceftaroline combination therapy.  Valve tissue cultures isolated MSSA in the setting of prior MSSA endocarditis and subsequent MRSA infection.  #Chronic hepatitis C, genotype 2B Treatment to be deferred as an outpatient.   RECOMMENDATIONS:    Continue dual therapy with daptomycin and ceftaroline Planning for 6 weeks from the date of his valve surgery Hepatitis C treatment as an outpatient Will follow-up again next week   Principal Problem:   Endocarditis of tricuspid valve Active Problems:   MRSA bacteremia   Chronic pain syndrome/methadone abuse    Tobacco abuse   Substance use disorder   Hepatitis C, chronic ([name])   Anxiety   Normocytic anemia   Prolonged QT interval   Sepsis ([name])   Elevated serum creatinine   Hypokalemia   Hypomagnesemia   Hyponatremia   Acute bacterial endocarditis   MSSA bacteremia   IVDU (intravenous drug user)   Pneumonia of both lower lobes due to methicillin resistant Staphylococcus aureus (MRSA) ([name])   Thrombocytopenia ([name])   Encounter for preoperative dental examination   Caries   Retained tooth root   Chronic apical periodontitis   Chronic periodontitis   Accretions on teeth   Impacted third molar tooth   Teeth missing   Phobia of dental procedure   Shock ([name])   Acute respiratory failure ([name])   Pleural effusion    MEDICATIONS:    Scheduled Meds:  Ferrous Fumarate  1 tablet Oral BID WC   And   vitamin B-12  100 mcg Oral BID WC   And   ascorbic acid  500 mg Oral BID WC   And   folic acid  1 mg Oral BID WC   aspirin EC  81 mg  Oral Daily   bisacodyl  10 mg Oral Daily   Or   bisacodyl  10 mg Rectal Daily   Chlorhexidine Gluconate Cloth  6 each Topical Daily   docusate sodium  100 mg Oral BID   escitalopram  10 mg Oral Daily   feeding supplement  237 mL Oral BID BM   gabapentin  300 mg Oral TID   methadone  135 mg Oral Daily   metoprolol tartrate  12.5 mg Per Tube BID   multivitamin with minerals  1 tablet Oral Daily   nicotine  14 mg Transdermal Daily   pantoprazole  40 mg Oral Daily   sodium chloride flush  10-40 mL Intracatheter Q12H   warfarin  5 mg Oral q1600   Warfarin - Pharmacist Dosing Inpatient   Does not apply q1600   Continuous Infusions:  ceFTAROline (TEFLARO) IV     DAPTOmycin (CUBICIN) 800 mg in sodium chloride 0.9 % IVPB     PRN Meds:.acetaminophen, alum & mag hydroxide-simeth, bisacodyl, diclofenac Sodium, docusate sodium, hydrOXYzine, loperamide, methocarbamol, metoprolol tartrate, mouth rinse, oxyCODONE, polyethylene glycol, promethazine, sodium chloride flush, sodium chloride flush  SUBJECTIVE:   There are no acute events noted overnight Patient reports no new complaints Tolerating antibiotics Afebrile.  Review of Systems  All other systems reviewed and are negative.     OBJECTIVE:  Blood pressure 112/63, pulse 81, temperature 97.9 F (36.6 C), temperature source Oral, resp. rate 19, height 5\' 10"  (1.778 m), weight 82.9 kg, SpO2 97 %. Body mass index is 26.22 kg/m.  Physical Exam Constitutional:      Appearance: Normal appearance.  Eyes:     Extraocular Movements: Extraocular movements intact.     Conjunctiva/sclera: Conjunctivae normal.  Pulmonary:     Effort: Pulmonary effort is normal. No respiratory distress.  Musculoskeletal:        General: Normal range of motion.     Cervical back: Normal range of motion and neck supple.     Comments: PICC line in place  Neurological:     General: No focal deficit present.     Mental Status: He is alert and oriented to  person, place, and time.  Psychiatric:        Mood and Affect: Mood normal.        Behavior: Behavior normal.      Lab Results: Lab Results  Component Value Date   WBC 4.4 05/02/2022   HGB 8.0 (L) 05/02/2022   HCT 25.5 (L) 05/02/2022   MCV 89.2 05/02/2022   PLT 162 05/02/2022    Lab Results  Component Value Date   NA 137 05/02/2022   K 4.2 05/02/2022   CO2 28 05/02/2022   GLUCOSE 95 05/02/2022   BUN 20 05/02/2022   CREATININE 0.85 05/02/2022   CALCIUM 9.0 05/02/2022   GFRNONAA >60 05/02/2022    Lab Results  Component Value Date   ALT 31 04/25/2022   AST 34 04/25/2022   GGT 27 12/24/2021   ALKPHOS 52 04/25/2022   BILITOT 0.3 04/25/2022       Component Value Date/Time   CRP 12.1 (H) 12/26/2021 1216       Component Value Date/Time   ESRSEDRATE 49 (H) 12/26/2021 1216     I have reviewed the micro and lab results in Epic.  Imaging: [place] EKG SITE RITE  Result Date: 05/01/2022 If Site Rite image not attached, placement could not be confirmed due to current cardiac rhythm.  [place] EKG SITE RITE  Result Date: 05/01/2022 If Site Rite image not attached, placement could not be confirmed due to current cardiac rhythm.    Imaging independently reviewed in Epic.    [facility] [phone] pager 05/03/2022, 11:21 AM  I have personally spent 50 minutes involved in face-to-face and non-face-to-face activities for this patient on the day of the visit. Professional time spent includes the following activities: Preparing to see the patient (review of tests), Obtaining and/or reviewing separately obtained history (admission/discharge record), Performing a medically appropriate examination and/or evaluation , Ordering medications/tests/procedures, referring and communicating with other health care professionals, Documenting clinical information in the EMR, Independently interpreting results (not separately  reported), Communicating results to the patient/family/caregiver, Counseling and educating the patient/family/caregiver and Care coordination (not separately reported).

## 2022-05-03 NOTE — TOC Progression Note (Addendum)
Transition of Care [facility]) - Progression Note    Patient Details  Name: Reginald Clarke MRN: [id number] Date of Birth: 11/18/1990  Transition of Care [facility]) CM/SW [place], RN Phone Number: 05/03/2022, 3:42 PM  Clinical Narrative:      Hulen Skains and asked Anderson Malta from [facility]  to review for appropriateness of LTAC. Patient was previously at [facility] and did not want to go back to that facility.  She will review and follow up. They are at capacity currently.   Follow up for Monday.  Expected Discharge Plan: Home/Self Care Barriers to Discharge: Continued Medical Work up  Expected Discharge Plan and Services Expected Discharge Plan: Home/Self Care   Discharge Planning Services: CM Consult   Living arrangements for the past 2 months: Single Family Home                 DME Arranged: N/A         HH Arranged: NA           Social Determinants of Health (SDOH) Interventions    Readmission Risk Interventions    02/26/2022   10:07 AM  Readmission Risk Prevention Plan  Transportation Screening Complete  PCP or Specialist Appt within 3-5 Days Complete  HRI or Home Care Consult Complete  Palliative Care Screening Not Applicable

## 2022-05-03 NOTE — Progress Notes (Signed)
Mobility Specialist Progress Note:   05/03/22 1320  Mobility  Activity Ambulated independently in hallway  Level of Assistance Independent  Assistive Device Other (Comment) (IV Pole)  Distance Ambulated (ft) 400 ft  Activity Response Tolerated well  $Mobility charge 1 Mobility   Pt received in bed and eager. No complaints. Pt left sitting EOB with all needs met and call bell in reach.   Selma Rodelo Mobility Specialist-Acute Rehab Secure Chat only

## 2022-05-03 NOTE — Progress Notes (Signed)
ANTICOAGULATION CONSULT NOTE - Follow Up Consult  Pharmacy Consult for Warfarin Indication:  tricuspid valve repair  No Known Allergies  Patient Measurements: Height: 5\' 10"  (177.8 cm) Weight: 82.9 kg (182 lb 12.2 oz) IBW/kg (Calculated) : 73  Vital Signs: Temp: 97.9 F (36.6 C) (09/28 2035) Temp Source: Oral (09/28 2035) BP: 112/63 (09/28 2035) Pulse Rate: 81 (09/28 2035)  Labs: Recent Labs    05/01/22 0835 05/02/22 0317 05/03/22 0359  HGB  --  8.0*  --   HCT  --  25.5*  --   PLT  --  162  --   LABPROT 21.5* 22.5* 25.4*  INR 1.9* 2.0* 2.3*  CREATININE 0.89 0.85  --   CKTOTAL  --  17*  --      Estimated Creatinine Clearance: 130 mL/min (by C-G formula based on SCr of 0.85 mg/dL).  Assessment:  31 yr old male s/p tricuspid valve repair on 04/23/22.  Warfarin initiated POD# 1 with 2.5 mg daily along with Enoxaparin 40 mg SQ q24h.  INR 1.3>>1.5 after 2.5 mg daily x 4 doses and increased to 5 mg daily on 9/24. Aspirin reduced to 81mg  daily now that INR at goal, naproxen discontinued, Trinsicon BID continued (using components due to backorder).   INR therapeutic at 2.3 today, CBC from stable.   Goal of Therapy:  INR 2-3 Monitor platelets by anticoagulation protocol: Yes   Plan:  Continue warfarin 5 mg daily. Daily PT/INR.   Dimple Nanas, PharmD, BCPS 05/03/2022 7:51 AM

## 2022-05-04 DIAGNOSIS — I079 Rheumatic tricuspid valve disease, unspecified: Secondary | ICD-10-CM | POA: Diagnosis not present

## 2022-05-04 LAB — CBC
HCT: 26.8 % — ABNORMAL LOW (ref 39.0–52.0)
Hemoglobin: 8.2 g/dL — ABNORMAL LOW (ref 13.0–17.0)
MCH: 27.4 pg (ref 26.0–34.0)
MCHC: 30.6 g/dL (ref 30.0–36.0)
MCV: 89.6 fL (ref 80.0–100.0)
Platelets: 183 10*3/uL (ref 150–400)
RBC: 2.99 MIL/uL — ABNORMAL LOW (ref 4.22–5.81)
RDW: 15.1 % (ref 11.5–15.5)
WBC: 5.3 10*3/uL (ref 4.0–10.5)
nRBC: 0 % (ref 0.0–0.2)

## 2022-05-04 LAB — PROTIME-INR
INR: 1.9 — ABNORMAL HIGH (ref 0.8–1.2)
Prothrombin Time: 21.2 seconds — ABNORMAL HIGH (ref 11.4–15.2)

## 2022-05-04 LAB — BASIC METABOLIC PANEL
Anion gap: 9 (ref 5–15)
BUN: 21 mg/dL — ABNORMAL HIGH (ref 6–20)
CO2: 29 mmol/L (ref 22–32)
Calcium: 9.5 mg/dL (ref 8.9–10.3)
Chloride: 100 mmol/L (ref 98–111)
Creatinine, Ser: 0.93 mg/dL (ref 0.61–1.24)
GFR, Estimated: >60 mL/min (ref >60)
Glucose, Bld: 97 mg/dL (ref 70–99)
Potassium: 4.1 mmol/L (ref 3.5–5.1)
Sodium: 138 mmol/L (ref 135–145)

## 2022-05-04 MED ORDER — METHADONE HCL 10 MG PO TABS
130.0000 mg | ORAL_TABLET | Freq: Every day | ORAL | Status: DC
  Administered 2022-05-05 – 2022-05-07 (×3): 130 mg via ORAL
  Filled 2022-05-04 (×3): qty 13

## 2022-05-04 NOTE — Progress Notes (Signed)
ANTICOAGULATION CONSULT NOTE - Follow Up Consult  Pharmacy Consult for Warfarin Indication:  tricuspid valve repair  No Known Allergies  Patient Measurements: Height: 5\' 10"  (177.8 cm) Weight: 83.4 kg (183 lb 13.8 oz) IBW/kg (Calculated) : 73  Vital Signs: Temp: 98.5 F (36.9 C) (09/29 2320) Temp Source: Oral (09/29 2320) BP: 111/39 (09/29 2320) Pulse Rate: 66 (09/29 2320)  Labs: Recent Labs    05/01/22 0835 05/02/22 0317 05/03/22 0359 05/04/22 0350  HGB  --  8.0*  --  8.2*  HCT  --  25.5*  --  26.8*  PLT  --  162  --  183  LABPROT 21.5* 22.5* 25.4* 21.2*  INR 1.9* 2.0* 2.3* 1.9*  CREATININE 0.89 0.85  --  0.93  CKTOTAL  --  17*  --   --      Estimated Creatinine Clearance: 118.8 mL/min (by C-G formula based on SCr of 0.93 mg/dL).  Assessment:  31 yr old male s/p tricuspid valve repair on 04/23/22.  Warfarin initiated POD# 1 with 2.5 mg daily along with Enoxaparin 40 mg SQ q24h.  INR 1.3>>1.5 after 2.5 mg daily x 4 doses and increased to 5 mg daily on 9/24. Aspirin reduced to 81mg  daily now that INR at goal, naproxen discontinued, Trinsicon BID continued (using components due to backorder).   INR subtherapeutic at 1.9 today, CBC from 9/30 AM stable. Will continue at current dose for now and consider adjusting if trending down. Patient did receive last night's dose.   Goal of Therapy:  INR 2-3 Monitor platelets by anticoagulation protocol: Yes   Plan:  Continue warfarin 5 mg daily. Daily PT/INR.  Vicenta Dunning, PharmD  PGY1 Pharmacy Resident

## 2022-05-04 NOTE — Plan of Care (Signed)
  Problem: Education: Goal: Knowledge of General Education information will improve Description: Including pain rating scale, medication(s)/side effects and non-pharmacologic comfort measures Outcome: Progressing   Problem: Health Behavior/Discharge Planning: Goal: Ability to manage health-related needs will improve Outcome: Progressing   Problem: Clinical Measurements: Goal: Ability to maintain clinical measurements within normal limits will improve Outcome: Progressing Goal: Will remain free from infection Outcome: Progressing Goal: Diagnostic test results will improve Outcome: Progressing Goal: Respiratory complications will improve Outcome: Progressing Goal: Cardiovascular complication will be avoided Outcome: Progressing   Problem: Activity: Goal: Risk for activity intolerance will decrease Outcome: Progressing   Problem: Nutrition: Goal: Adequate nutrition will be maintained Outcome: Progressing   Problem: Coping: Goal: Level of anxiety will decrease Outcome: Progressing   Problem: Elimination: Goal: Will not experience complications related to bowel motility Outcome: Progressing Goal: Will not experience complications related to urinary retention Outcome: Progressing   Problem: Pain Managment: Goal: General experience of comfort will improve Outcome: Progressing   Problem: Safety: Goal: Ability to remain free from injury will improve Outcome: Progressing   Problem: Skin Integrity: Goal: Risk for impaired skin integrity will decrease Outcome: Progressing   Problem: Education: Goal: Will demonstrate proper wound care and an understanding of methods to prevent future damage Outcome: Progressing Goal: Knowledge of disease or condition will improve Outcome: Progressing Goal: Knowledge of the prescribed therapeutic regimen will improve Outcome: Progressing Goal: Individualized Educational Video(s) Outcome: Progressing   Problem: Activity: Goal: Risk for  activity intolerance will decrease Outcome: Progressing   Problem: Cardiac: Goal: Will achieve and/or maintain hemodynamic stability Outcome: Progressing   Problem: Clinical Measurements: Goal: Postoperative complications will be avoided or minimized Outcome: Progressing   Problem: Respiratory: Goal: Respiratory status will improve Outcome: Progressing   Problem: Skin Integrity: Goal: Wound healing without signs and symptoms of infection Outcome: Progressing Goal: Risk for impaired skin integrity will decrease Outcome: Progressing   Problem: Urinary Elimination: Goal: Ability to achieve and maintain adequate renal perfusion and functioning will improve Outcome: Progressing   Problem: Activity: Goal: Ability to tolerate increased activity will improve Outcome: Progressing   Problem: Respiratory: Goal: Ability to maintain a clear airway and adequate ventilation will improve Outcome: Progressing   Problem: Role Relationship: Goal: Method of communication will improve Outcome: Progressing   

## 2022-05-04 NOTE — Progress Notes (Signed)
PROGRESS NOTE        PATIENT DETAILS Name: Reginald Clarke Age: 30 y.o. Sex: male Date of Birth: 04-22-1991 Admit Date: 02/25/2022 Admitting Physician Dorcas Carrow, MD [id number], [name], PA-C  Brief Summary: Patient is a 31 y.o.  male with history of recurrent MSSA native tricuspid valve endocarditis in spite of completing several courses of IV antibiotics.  Evaluated by ID/CT surgery-underwent tricuspid valve debridement/repair/annuloplasty on 9/19.  Significant events: 4/12-5/10>> hospitalization for MSSA bacteremia with native TV endocarditis-angio vac procedure on 4/26.Completed cefazolin on 5/10-planned weekly dalbavancin at the ID clinic.   5/22>> outpatient blood culture positive for MSSA 5/24-6/8>> hospitalization [facility] for persistent bacteremia-worsening back pain-discharge to [facility] on IV Ancef. 7/5>> discharged from [facility] on oral cefdinir.  Continued to have intermittent fever postdischarge.   7/24>> admit for-persistent fever/nausea/vomiting/diarrhea-blood cultures positive for MSSA. 9/5>> recurrent fever- blood cultures growing Staph aureus  9/5 >> PICC line discontinued 9/19>> debridement of tricuspid valve/valve reconstruction/annuloplasty 9/27>> transfer to [facility]  Significant studies: 7/25>> CT chest: Multifocal airspace opacities-increasing cavitary nodules 7/25>> MRI thoracic spine: No evidence of infection in thoracic spine.  Significant microbiology data: 7/24>> urine culture: Negative 7/25>> blood culture: 1/2-MSSA. 7/27>> blood culture: No growth 7/29>> blood culture: No growth 9/04>> blood cultures:MRSA 9/07>> blood cultures:neg 9/19>> tricuspid valve tissue culture: Staph aureus-sensitivities pending  Procedures: 8/3>> TEE: 3 vegetations on tricuspid valve-severe TR.  EF 60-65% 9/14>> multiple teeth extraction 9/19>> debridement of tricuspid valve/valve reconstruction/annuloplasty  Consults: ID CT surgery Dentist    Subjective:  No issues overnight.  No chest pain/no fever.  No shortness of breath.  Objective: Vitals: Blood pressure (!) 98/55, pulse 66, temperature 97.9 F (36.6 C), temperature source Oral, resp. rate 17, height 5\' 10"  (1.778 m), weight 83.4 kg, SpO2 96 %.   Exam: Gen Exam:Alert awake-not in any distress HEENT:atraumatic, normocephalic Chest: B/L clear to auscultation anteriorly CVS:S1S2 regular Abdomen:soft non tender, non distended Extremities:no edema Neurology: Non focal Skin: no rash   Assessment/Plan: Sepsis due to recurrent MSSA bacteremia with native tricuspid valve endocarditis-s/p valve repair/annuloplasty on 9/9 Sepsis physiology resolved-discussed with infectious disease MD on 9/28 Dr. 10/28 6 weeks of Teflaro/daptomycin from 9/19-this will unfortunately have to be done as an inpatient.  Goal INR 2-3-pharmacy now managing Coumadin.  Per CT surgery note-needs Coumadin for 3 months from valve repair.  Normocytic anemia: Due to chronic disease/recurrent endocarditis: No indication for transfusion.  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  Mild transaminitis resolved  Nutrition Status: Nutrition Problem: Inadequate oral intake Etiology: vomiting, nausea Signs/Symptoms: per patient/family report Interventions: Ensure Enlive (each supplement provides 350kcal and 20 grams of protein), MVI, Snacks  BMI: Estimated body mass index is 26.38 kg/m as calculated from the following:   Height as of this encounter: 5\' 10"  (1.778 m).   Weight as of this encounter: 83.4 kg.   Code status:   Code Status: Full Code   DVT Prophylaxis: SCDs Start: 04/24/22 0836 SCDs Start: 04/23/22 1259 warfarin (COUMADIN) tablet 5 mg    Family Communication: Significant other at bedside  Disposition Plan: Status is: Inpatient   Diet: Diet Order             DIET DYS 3 Room  service appropriate? Yes; Fluid consistency: Thin  Diet effective now  MEDICATIONS: Scheduled Meds:  Ferrous Fumarate  1 tablet Oral BID WC   And   vitamin B-12  100 mcg Oral BID WC   And   ascorbic acid  500 mg Oral BID WC   And   folic acid  1 mg Oral BID WC   aspirin EC  81 mg Oral Daily   bisacodyl  10 mg Oral Daily   Or   bisacodyl  10 mg Rectal Daily   Chlorhexidine Gluconate Cloth  6 each Topical Daily   docusate sodium  100 mg Oral BID   escitalopram  10 mg Oral Daily   feeding supplement  237 mL Oral BID BM   gabapentin  300 mg Oral TID   [START ON 05/05/2022] methadone  130 mg Oral Daily   metoprolol tartrate  12.5 mg Per Tube BID   multivitamin with minerals  1 tablet Oral Daily   nicotine  14 mg Transdermal Daily   pantoprazole  40 mg Oral Daily   sodium chloride flush  10-40 mL Intracatheter Q12H   warfarin  5 mg Oral q1600   Warfarin - Pharmacist Dosing Inpatient   Does not apply q1600   Continuous Infusions:  ceFTAROline (TEFLARO) IV 600 mg (05/04/22 0456)   DAPTOmycin (CUBICIN) 800 mg in sodium chloride 0.9 % IVPB Stopped (05/03/22 2206)   PRN Meds:.acetaminophen, alum & mag hydroxide-simeth, bisacodyl, diclofenac Sodium, docusate sodium, hydrOXYzine, loperamide, methocarbamol, metoprolol tartrate, mouth rinse, oxyCODONE, polyethylene glycol, promethazine, sodium chloride flush, sodium chloride flush   I have personally reviewed following labs and imaging studies  LABORATORY DATA: Recent Labs  Lab 04/28/22 0724 04/30/22 0620 05/02/22 0317 05/04/22 0350  WBC 3.3* 3.4* 4.4 5.3  HGB 7.2* 8.3* 8.0* 8.2*  HCT 22.8* 25.6* 25.5* 26.8*  PLT 109* 146* 162 183  MCV 89.1 87.7 89.2 89.6  MCH 28.1 28.4 28.0 27.4  MCHC 31.6 32.4 31.4 30.6  RDW 15.4 15.4 15.2 15.1     Recent Labs  Lab 04/28/22 0724 04/29/22 0137 04/30/22 0620 05/01/22 0835 05/02/22 0317 05/03/22 0359 05/04/22 0350  NA 139  --  137  --  137  --  138  K 4.3  --   4.4  --  4.2  --  4.1  CL 108  --  105  --  103  --  100  CO2 24  --  26  --  28  --  29  GLUCOSE 87  --  86  --  95  --  97  BUN 17  --  16  --  20  --  21*  CREATININE 0.68  --  0.85 0.89 0.85  --  0.93  CALCIUM 8.6*  --  8.8*  --  9.0  --  9.5  INR 1.5*   < > 1.8* 1.9* 2.0* 2.3* 1.9*   < > = values in this interval not displayed.     MICROBIOLOGY: No results found for this or any previous visit (from the past 240 hour(s)).    RADIOLOGY STUDIES/RESULTS: No results found.   LOS: 17 days   Signature  Oren Binet M.D on 05/04/2022 at 1:08 PM   -  To page go to [email or web address]

## 2022-05-05 DIAGNOSIS — I079 Rheumatic tricuspid valve disease, unspecified: Secondary | ICD-10-CM | POA: Diagnosis not present

## 2022-05-05 LAB — AEROBIC/ANAEROBIC CULTURE W GRAM STAIN (SURGICAL/DEEP WOUND)

## 2022-05-05 LAB — PROTIME-INR
INR: 2.5 — ABNORMAL HIGH (ref 0.8–1.2)
Prothrombin Time: 26.8 seconds — ABNORMAL HIGH (ref 11.4–15.2)

## 2022-05-05 NOTE — Progress Notes (Signed)
ANTICOAGULATION CONSULT NOTE - Follow Up Consult  Pharmacy Consult for Warfarin Indication:  tricuspid valve repair  No Known Allergies  Patient Measurements: Height: 5\' 10"  (177.8 cm) Weight: 83.4 kg (183 lb 13.8 oz) IBW/kg (Calculated) : 73  Vital Signs: Temp: 97.8 F (36.6 C) (09/30 2017) Temp Source: Oral (09/30 2017) BP: 96/50 (09/30 2017) Pulse Rate: 66 (09/30 2017)  Labs: Recent Labs    05/03/22 0359 05/04/22 0350 05/05/22 0543  HGB  --  8.2*  --   HCT  --  26.8*  --   PLT  --  183  --   LABPROT 25.4* 21.2* 26.8*  INR 2.3* 1.9* 2.5*  CREATININE  --  0.93  --      Estimated Creatinine Clearance: 118.8 mL/min (by C-G formula based on SCr of 0.93 mg/dL).  Assessment: Reginald Clarke is a 31 yr old male s/p tricuspid valve repair on 04/23/22. Warfarin initiated post-op day 1 with 2.5 mg daily along with Enoxaparin 40 mg SQ q24h.  INR 1.3>>1.5 after 2.5 mg daily x 4 doses and increased to 5 mg daily on 9/24. Aspirin reduced to 81mg  daily now that INR at goal, naproxen discontinued, Trinsicon BID continued (using components due to backorder).   INR therapeutic at 2.5 today, increased from 1.9 to 2.5. CBC from 9/30 AM stable. Patient did receive last night's dose. No signs or symptoms of bleeding per notes. Patient continues of ceftaroline.   Goal of Therapy:  INR 2-3 Monitor platelets by anticoagulation protocol: Yes   Plan:  Continue Warfarin 5mg  once daily  Daily PT/INR.  Vicenta Dunning, PharmD  PGY1 Pharmacy Resident

## 2022-05-05 NOTE — Progress Notes (Signed)
PROGRESS NOTE        PATIENT DETAILS Name: Reginald Clarke Age: 31 y.o. Sex: male Date of Birth: [birth year] Admit Date: 02/25/2022 Admitting Physician [name], MD [id number], Randa Evens, PA-C  Brief Summary: Patient is a 31 y.o.  male with history of recurrent MSSA native tricuspid valve endocarditis in spite of completing several courses of IV antibiotics.  Evaluated by ID/CT surgery-underwent tricuspid valve debridement/repair/annuloplasty on 9/19.  Significant events: 4/12-5/10>> hospitalization for MSSA bacteremia with native TV endocarditis-angio vac procedure on 4/26.Completed cefazolin on 5/10-planned weekly dalbavancin at the ID clinic.   5/22>> outpatient blood culture positive for MSSA 5/24-6/8>> hospitalization [facility] for persistent bacteremia-worsening back pain-discharge to [facility] on IV Ancef. 7/5>> discharged from [facility] on oral cefdinir.  Continued to have intermittent fever postdischarge.   7/24>> admit for-persistent fever/nausea/vomiting/diarrhea-blood cultures positive for MSSA. 9/5>> recurrent fever- blood cultures growing Staph aureus  9/5 >> PICC line discontinued 9/19>> debridement of tricuspid valve/valve reconstruction/annuloplasty 9/27>> transfer to [facility]  Significant studies: 7/25>> CT chest: Multifocal airspace opacities-increasing cavitary nodules 7/25>> MRI thoracic spine: No evidence of infection in thoracic spine.  Significant microbiology data: 7/24>> urine culture: Negative 7/25>> blood culture: 1/2-MSSA. 7/27>> blood culture: No growth 7/29>> blood culture: No growth 9/04>> blood cultures:MRSA 9/07>> blood cultures:neg 9/19>> tricuspid valve tissue culture: Staph aureus-sensitivities pending  Procedures: 8/3>> TEE: 3 vegetations on tricuspid valve-severe TR.  EF 60-65% 9/14>> multiple teeth extraction 9/19>> debridement of tricuspid valve/valve reconstruction/annuloplasty  Consults: ID CT surgery Dentist    Subjective:  No issues overnight-once methadone dosage slowly tapered down.  Objective: Vitals: Blood pressure (!) 96/50, pulse 66, temperature 97.8 F (36.6 C), temperature source Oral, resp. rate 16, height 5\' 10"  (1.778 m), weight 83.4 kg, SpO2 96 %.   Exam: Gen Exam:Alert awake-not in any distress HEENT:atraumatic, normocephalic Chest: B/L clear to auscultation anteriorly CVS:S1S2 regular Abdomen:soft non tender, non distended Extremities:no edema Neurology: Non focal Skin: no rash   Assessment/Plan: Sepsis due to recurrent MSSA bacteremia with native tricuspid valve endocarditis-s/p valve repair/annuloplasty on 9/9 Sepsis physiology resolved-discussed with infectious disease MD on 9/28 Dr. Melton Alar 6 weeks of Teflaro/daptomycin from 9/19-this will unfortunately have to be done as an inpatient.  Goal INR 2-3-pharmacy now managing Coumadin.  Per CT surgery note-needs Coumadin for 3 months from valve repair.  Normocytic anemia: Due to chronic disease/recurrent endocarditis: No indication for transfusion.  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  Chronic HCV  outpatient follow-up with ID.  Mild transaminitis resolved  Nutrition Status: Nutrition Problem: Inadequate oral intake Etiology: vomiting, nausea Signs/Symptoms: per patient/family report Interventions: Ensure Enlive (each supplement provides 350kcal and 20 grams of protein), MVI, Snacks  BMI: Estimated body mass index is 26.38 kg/m as calculated from the following:   Height as of this encounter: 5\' 10"  (1.778 m).   Weight as of this encounter: 83.4 kg.   Code status:   Code Status: Full Code   DVT Prophylaxis: SCDs Start: 04/24/22 0836 SCDs Start: 04/23/22 1259 warfarin (COUMADIN) tablet 5 mg    Family  Communication: Significant other at bedside  Disposition Plan: Status is: Inpatient   Diet: Diet Order  DIET DYS 3 Room service appropriate? Yes; Fluid consistency: Thin  Diet effective now                    MEDICATIONS: Scheduled Meds:  Ferrous Fumarate  1 tablet Oral BID WC   And   vitamin B-12  100 mcg Oral BID WC   And   ascorbic acid  500 mg Oral BID WC   And   folic acid  1 mg Oral BID WC   aspirin EC  81 mg Oral Daily   bisacodyl  10 mg Oral Daily   Or   bisacodyl  10 mg Rectal Daily   Chlorhexidine Gluconate Cloth  6 each Topical Daily   docusate sodium  100 mg Oral BID   escitalopram  10 mg Oral Daily   feeding supplement  237 mL Oral BID BM   gabapentin  300 mg Oral TID   methadone  130 mg Oral Daily   metoprolol tartrate  12.5 mg Per Tube BID   multivitamin with minerals  1 tablet Oral Daily   nicotine  14 mg Transdermal Daily   pantoprazole  40 mg Oral Daily   sodium chloride flush  10-40 mL Intracatheter Q12H   warfarin  5 mg Oral q1600   Warfarin - Pharmacist Dosing Inpatient   Does not apply q1600   Continuous Infusions:  ceFTAROline (TEFLARO) IV 600 mg (05/05/22 0557)   DAPTOmycin (CUBICIN) 800 mg in sodium chloride 0.9 % IVPB 800 mg (05/04/22 2046)   PRN Meds:.acetaminophen, alum & mag hydroxide-simeth, bisacodyl, diclofenac Sodium, docusate sodium, hydrOXYzine, loperamide, methocarbamol, metoprolol tartrate, mouth rinse, oxyCODONE, polyethylene glycol, promethazine, sodium chloride flush, sodium chloride flush   I have personally reviewed following labs and imaging studies  LABORATORY DATA: Recent Labs  Lab 04/30/22 0620 05/02/22 0317 05/04/22 0350  WBC 3.4* 4.4 5.3  HGB 8.3* 8.0* 8.2*  HCT 25.6* 25.5* 26.8*  PLT 146* 162 183  MCV 87.7 89.2 89.6  MCH 28.4 28.0 27.4  MCHC 32.4 31.4 30.6  RDW 15.4 15.2 15.1     Recent Labs  Lab 04/30/22 0620 05/01/22 0835 05/02/22 0317 05/03/22 0359 05/04/22 0350 05/05/22 0543   NA 137  --  137  --  138  --   K 4.4  --  4.2  --  4.1  --   CL 105  --  103  --  100  --   CO2 26  --  28  --  29  --   GLUCOSE 86  --  95  --  97  --   BUN 16  --  20  --  21*  --   CREATININE 0.85 0.89 0.85  --  0.93  --   CALCIUM 8.8*  --  9.0  --  9.5  --   INR 1.8* 1.9* 2.0* 2.3* 1.9* 2.5*     MICROBIOLOGY: No results found for this or any previous visit (from the past 240 hour(s)).    RADIOLOGY STUDIES/RESULTS: No results found.   LOS: 48 days   Signature  Oren Binet M.D on 05/05/2022 at 11:07 AM   -  To page go to [email or web address]

## 2022-05-06 DIAGNOSIS — I079 Rheumatic tricuspid valve disease, unspecified: Secondary | ICD-10-CM | POA: Diagnosis not present

## 2022-05-06 LAB — CBC
HCT: 27.3 % — ABNORMAL LOW (ref 39.0–52.0)
Hemoglobin: 8.6 g/dL — ABNORMAL LOW (ref 13.0–17.0)
MCH: 27.8 pg (ref 26.0–34.0)
MCHC: 31.5 g/dL (ref 30.0–36.0)
MCV: 88.3 fL (ref 80.0–100.0)
Platelets: 201 10*3/uL (ref 150–400)
RBC: 3.09 MIL/uL — ABNORMAL LOW (ref 4.22–5.81)
RDW: 15.2 % (ref 11.5–15.5)
WBC: 5 10*3/uL (ref 4.0–10.5)
nRBC: 0 % (ref 0.0–0.2)

## 2022-05-06 LAB — BASIC METABOLIC PANEL
Anion gap: 9 (ref 5–15)
BUN: 13 mg/dL (ref 6–20)
CO2: 28 mmol/L (ref 22–32)
Calcium: 9.2 mg/dL (ref 8.9–10.3)
Chloride: 102 mmol/L (ref 98–111)
Creatinine, Ser: 0.84 mg/dL (ref 0.61–1.24)
GFR, Estimated: >60 mL/min (ref >60)
Glucose, Bld: 101 mg/dL — ABNORMAL HIGH (ref 70–99)
Potassium: 3.9 mmol/L (ref 3.5–5.1)
Sodium: 139 mmol/L (ref 135–145)

## 2022-05-06 LAB — PROTIME-INR
INR: 3 — ABNORMAL HIGH (ref 0.8–1.2)
Prothrombin Time: 31.1 seconds — ABNORMAL HIGH (ref 11.4–15.2)

## 2022-05-06 MED ORDER — WARFARIN SODIUM 2.5 MG PO TABS
2.5000 mg | ORAL_TABLET | Freq: Once | ORAL | Status: AC
  Administered 2022-05-06: 2.5 mg via ORAL
  Filled 2022-05-06: qty 1

## 2022-05-06 NOTE — Progress Notes (Addendum)
ANTICOAGULATION CONSULT NOTE - Follow Up Consult  Pharmacy Consult for Warfarin Indication:  tricuspid valve repair  No Known Allergies  Patient Measurements: Height: 5\' 10"  (177.8 cm) Weight:  (PT REQUESTED WEUGHT BE TAKEN LATER") IBW/kg (Calculated) : 73  Vital Signs: Temp: 98.6 F (37 C) (10/02 0513) Temp Source: Oral (10/02 0513) BP: 102/57 (10/02 0516) Pulse Rate: 60 (10/02 0513)  Labs: Recent Labs    05/04/22 0350 05/05/22 0543 05/06/22 0339  HGB 8.2*  --  8.6*  HCT 26.8*  --  27.3*  PLT 183  --  201  LABPROT 21.2* 26.8* 31.1*  INR 1.9* 2.5* 3.0*  CREATININE 0.93  --  0.84     Estimated Creatinine Clearance: 131.6 mL/min (by C-G formula based on SCr of 0.84 mg/dL).  Assessment: JH is a 31 yr old male s/p tricuspid valve repair on 04/23/22. Warfarin initiated post-op day 1 along with Lovenox 40mg  SQ q24h. Aspirin reduced to 81mg  daily and Lovenox discontinued once INR at goal, naproxen discontinued, Trinsicon BID continued (using components due to backorder). Plan to treat w/ warfarin x 3 months per CT surgery.    INR therapeutic at 3 but at upper end of goal. INR trend with large increase over past 3 days (1.9 >> 2.5 >> 3). CBC remains stable. Given large increase in INR, will empirically reduce warfarin dose this evening to prevent supratherapeutic levels. Patient continues on ceftaroline and daptomycin through 10/31 (broad spectrum antibiotics may lead to increased INR)  Goal of Therapy:  INR 2-3 Monitor platelets by anticoagulation protocol: Yes   Plan:  Reduce warfarin to 2.5mg  PO x1 tonight  Daily PT/INR.  Dimple Nanas, PharmD, BCPS 05/06/2022 7:36 AM

## 2022-05-06 NOTE — Progress Notes (Signed)
PROGRESS NOTE        PATIENT DETAILS Name: Reginald Clarke Age: 31 y.o. Sex: male Date of Birth: 02/11/1991 Admit Date: 02/25/2022 Admitting Physician [name], MD [id number], Randa Evens, PA-C  Brief Summary: Patient is a 31 y.o.  male with history of recurrent MSSA native tricuspid valve endocarditis in spite of completing several courses of IV antibiotics.  Evaluated by ID/CT surgery-underwent tricuspid valve debridement/repair/annuloplasty on 9/19.  Significant events: 4/12-5/10>> hospitalization for MSSA bacteremia with native TV endocarditis-angio vac procedure on 4/26.Completed cefazolin on 5/10-planned weekly dalbavancin at the ID clinic.   5/22>> outpatient blood culture positive for MSSA 5/24-6/8>> hospitalization [facility] for persistent bacteremia-worsening back pain-discharge to [facility] on IV Ancef. 7/5>> discharged from [facility] on oral cefdinir.  Continued to have intermittent fever postdischarge.   7/24>> admit for-persistent fever/nausea/vomiting/diarrhea-blood cultures positive for MSSA. 9/5>> recurrent fever- blood cultures growing Staph aureus  9/5 >> PICC line discontinued 9/19>> debridement of tricuspid valve/valve reconstruction/annuloplasty 9/27>> transfer to [facility]  Significant studies: 7/25>> CT chest: Multifocal airspace opacities-increasing cavitary nodules 7/25>> MRI thoracic spine: No evidence of infection in thoracic spine.  Significant microbiology data: 7/24>> urine culture: Negative 7/25>> blood culture: 1/2-MSSA. 7/27>> blood culture: No growth 7/29>> blood culture: No growth 9/04>> blood cultures:MRSA 9/07>> blood cultures:neg 9/19>> tricuspid valve tissue culture: Staph aureus-sensitivities pending  Procedures: 8/3>> TEE: 3 vegetations on tricuspid valve-severe TR.  EF 60-65% 9/14>> multiple teeth extraction 9/19>> debridement of tricuspid valve/valve reconstruction/annuloplasty  Consults: ID CT surgery Dentist    Subjective:  No major issues overnight.  Lying comfortably in bed.  Objective: Vitals: Blood pressure (!) 97/41, pulse 60, temperature 98.6 F (37 C), temperature source Oral, resp. rate 15, height 5\' 10"  (1.778 m), weight 83.4 kg, SpO2 100 %.   Exam: Gen Exam:Alert awake-not in any distress HEENT:atraumatic, normocephalic Chest: B/L clear to auscultation anteriorly CVS:S1S2 regular Abdomen:soft non tender, non distended Extremities:no edema Neurology: Non focal Skin: no rash   Assessment/Plan: Sepsis due to recurrent MSSA bacteremia with native tricuspid valve endocarditis-s/p valve repair/annuloplasty on 9/9 Sepsis physiology resolved-discussed with infectious disease MD on 9/28 Dr. Melton Alar 6 weeks of Teflaro/daptomycin from 9/19-this will unfortunately have to be done as an inpatient.  Goal INR 2-3-pharmacy now managing Coumadin.  Per CT surgery note-needs Coumadin for 3 months from valve repair.  Normocytic anemia: Due to chronic disease/recurrent endocarditis: No indication for transfusion.  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  Chronic HCV  outpatient follow-up with ID.  Mild transaminitis resolved  Nutrition Status: Nutrition Problem: Inadequate oral intake Etiology: vomiting, nausea Signs/Symptoms: per patient/family report Interventions: Ensure Enlive (each supplement provides 350kcal and 20 grams of protein), MVI, Snacks  BMI: Estimated body mass index is 26.38 kg/m as calculated from the following:   Height as of this encounter: 5\' 10"  (1.778 m).   Weight as of this encounter: 83.4 kg.   Code status:   Code Status: Full Code   DVT Prophylaxis: SCDs Start: 04/24/22 0836 SCDs Start: 04/23/22 1259 warfarin (COUMADIN) tablet 2.5 mg    Family Communication:  Significant other at bedside  Disposition Plan: Status is: Inpatient for 6 weeks from 9/19 to complete 6 weeks of IV antibiotics.   Diet: Diet  Order             DIET DYS 3 Room service appropriate? Yes; Fluid consistency: Thin  Diet effective now                    MEDICATIONS: Scheduled Meds:  Ferrous Fumarate  1 tablet Oral BID WC   And   vitamin B-12  100 mcg Oral BID WC   And   ascorbic acid  500 mg Oral BID WC   And   folic acid  1 mg Oral BID WC   aspirin EC  81 mg Oral Daily   bisacodyl  10 mg Oral Daily   Or   bisacodyl  10 mg Rectal Daily   Chlorhexidine Gluconate Cloth  6 each Topical Daily   docusate sodium  100 mg Oral BID   escitalopram  10 mg Oral Daily   feeding supplement  237 mL Oral BID BM   gabapentin  300 mg Oral TID   methadone  130 mg Oral Daily   metoprolol tartrate  12.5 mg Per Tube BID   multivitamin with minerals  1 tablet Oral Daily   nicotine  14 mg Transdermal Daily   pantoprazole  40 mg Oral Daily   sodium chloride flush  10-40 mL Intracatheter Q12H   warfarin  2.5 mg Oral ONCE-1600   Warfarin - Pharmacist Dosing Inpatient   Does not apply q1600   Continuous Infusions:  ceFTAROline (TEFLARO) IV 600 mg (05/06/22 0512)   DAPTOmycin (CUBICIN) 800 mg in sodium chloride 0.9 % IVPB Stopped (05/05/22 2117)   PRN Meds:.acetaminophen, alum & mag hydroxide-simeth, bisacodyl, diclofenac Sodium, docusate sodium, hydrOXYzine, loperamide, methocarbamol, metoprolol tartrate, mouth rinse, oxyCODONE, polyethylene glycol, promethazine, sodium chloride flush, sodium chloride flush   I have personally reviewed following labs and imaging studies  LABORATORY DATA: Recent Labs  Lab 04/30/22 0620 05/02/22 0317 05/04/22 0350 05/06/22 0339  WBC 3.4* 4.4 5.3 5.0  HGB 8.3* 8.0* 8.2* 8.6*  HCT 25.6* 25.5* 26.8* 27.3*  PLT 146* 162 183 201  MCV 87.7 89.2 89.6 88.3  MCH 28.4 28.0 27.4 27.8  MCHC 32.4 31.4 30.6 31.5  RDW 15.4 15.2 15.1 15.2      Recent Labs  Lab 04/30/22 0620 05/01/22 0835 05/02/22 0317 05/03/22 0359 05/04/22 0350 05/05/22 0543 05/06/22 0339  NA 137  --  137  --  138  --  139  K 4.4  --  4.2  --  4.1  --  3.9  CL 105  --  103  --  100  --  102  CO2 26  --  28  --  29  --  28  GLUCOSE 86  --  95  --  97  --  101*  BUN 16  --  20  --  21*  --  13  CREATININE 0.85 0.89 0.85  --  0.93  --  0.84  CALCIUM 8.8*  --  9.0  --  9.5  --  9.2  INR 1.8* 1.9* 2.0* 2.3* 1.9* 2.5* 3.0*     MICROBIOLOGY: No results found for this or any previous visit (from the past 240 hour(s)).    RADIOLOGY STUDIES/RESULTS: No results found.   LOS: 70 days   Signature  Oren Binet M.D on 05/06/2022 at 10:56 AM   -  To page go to [email or web address]

## 2022-05-06 NOTE — TOC Progression Note (Signed)
Transition of Care [facility]) - Progression Note    Patient Details  Name: Reginald Clarke MRN: [id number] Date of Birth: August 22, 1990  Transition of Care [facility] CM/SW [place], RN Phone Number: 05/06/2022, 3:35 PM  Clinical Narrative:     Damaris Schooner to Anderson Malta at [facility]	[place]. Anderson Malta states that [facility] does not take patient's on Methadone.   Expected Discharge Plan: Home/Self Care Barriers to Discharge: Continued Medical Work up  Expected Discharge Plan and Services Expected Discharge Plan: Home/Self Care   Discharge Planning Services: CM Consult   Living arrangements for the past 2 months: Single Family Home                 DME Arranged: N/A         HH Arranged: NA           Social Determinants of Health (SDOH) Interventions    Readmission Risk Interventions    02/26/2022   10:07 AM  Readmission Risk Prevention Plan  Transportation Screening Complete  PCP or Specialist Appt within 3-5 Days Complete  HRI or Home Care Consult Complete  Palliative Care Screening Not Applicable

## 2022-05-06 NOTE — Progress Notes (Signed)
Per RN pt walking independently. Will sign off.  Yves Dill BS, ACSM-CEP 05/06/2022 1:59 PM

## 2022-05-07 DIAGNOSIS — I079 Rheumatic tricuspid valve disease, unspecified: Secondary | ICD-10-CM | POA: Diagnosis not present

## 2022-05-07 LAB — PROTIME-INR
INR: 3 — ABNORMAL HIGH (ref 0.8–1.2)
Prothrombin Time: 30.9 seconds — ABNORMAL HIGH (ref 11.4–15.2)

## 2022-05-07 MED ORDER — METHADONE HCL 5 MG PO TABS: 82.0000 mg | ORAL_TABLET | Freq: Every day | ORAL | Status: DC

## 2022-05-07 MED ORDER — METHADONE HCL 5 MG PO TABS
124.0000 mg | ORAL_TABLET | Freq: Every day | ORAL | Status: AC
  Administered 2022-05-08 – 2022-05-10 (×3): 125 mg via ORAL
  Filled 2022-05-07 (×3): qty 1

## 2022-05-07 MED ORDER — METHADONE HCL 5 MG PO TABS
118.0000 mg | ORAL_TABLET | Freq: Every day | ORAL | Status: AC
  Administered 2022-05-11 – 2022-05-13 (×3): 117.5 mg via ORAL
  Filled 2022-05-07 (×3): qty 2

## 2022-05-07 MED ORDER — METHADONE HCL 5 MG PO TABS
106.0000 mg | ORAL_TABLET | Freq: Every day | ORAL | Status: AC
  Administered 2022-05-17 – 2022-05-19 (×3): 105 mg via ORAL
  Filled 2022-05-07 (×3): qty 1

## 2022-05-07 MED ORDER — WARFARIN SODIUM 2.5 MG PO TABS
2.5000 mg | ORAL_TABLET | Freq: Once | ORAL | Status: AC
  Administered 2022-05-07: 2.5 mg via ORAL
  Filled 2022-05-07: qty 1

## 2022-05-07 MED ORDER — METHADONE HCL 5 MG PO TABS
112.0000 mg | ORAL_TABLET | Freq: Every day | ORAL | Status: AC
  Administered 2022-05-14 – 2022-05-16 (×3): 112.5 mg via ORAL
  Filled 2022-05-07 (×3): qty 1

## 2022-05-07 MED ORDER — METHADONE HCL 5 MG PO TABS: 75.0000 mg | ORAL_TABLET | Freq: Every day | ORAL | Status: DC

## 2022-05-07 MED ORDER — METHADONE HCL 5 MG PO TABS
88.0000 mg | ORAL_TABLET | Freq: Every day | ORAL | Status: DC
  Administered 2022-05-26: 87.5 mg via ORAL
  Filled 2022-05-07: qty 2

## 2022-05-07 MED ORDER — METHADONE HCL 10 MG PO TABS
100.0000 mg | ORAL_TABLET | Freq: Every day | ORAL | Status: AC
  Administered 2022-05-20 – 2022-05-22 (×3): 100 mg via ORAL
  Filled 2022-05-07 (×4): qty 10

## 2022-05-07 MED ORDER — METHADONE HCL 5 MG PO TABS
94.0000 mg | ORAL_TABLET | Freq: Every day | ORAL | Status: AC
  Administered 2022-05-23 – 2022-05-25 (×3): 95 mg via ORAL
  Filled 2022-05-07 (×4): qty 1

## 2022-05-07 NOTE — Progress Notes (Signed)
PROGRESS NOTE        PATIENT DETAILS Name: Reginald Clarke Age: 31 y.o. Sex: male Date of Birth: 1990/09/20 Admit Date: 02/25/2022 Admitting Physician [name], MD [id number], Randa Evens, PA-C  Brief Summary: Patient is a 31 y.o.  male with history of recurrent MSSA native tricuspid valve endocarditis in spite of completing several courses of IV antibiotics.  Evaluated by ID/CT surgery-underwent tricuspid valve debridement/repair/annuloplasty on 9/19.  Significant events: 4/12-5/10>> hospitalization for MSSA bacteremia with native TV endocarditis-angio vac procedure on 4/26.Completed cefazolin on 5/10-planned weekly dalbavancin at the ID clinic.   5/22>> outpatient blood culture positive for MSSA 5/24-6/8>> hospitalization [facility] for persistent bacteremia-worsening back pain-discharge to [facility] on IV Ancef. 7/5>> discharged from [facility] on oral cefdinir.  Continued to have intermittent fever postdischarge.   7/24>> admit for-persistent fever/nausea/vomiting/diarrhea-blood cultures positive for MSSA. 9/5>> recurrent fever- blood cultures growing Staph aureus  9/5 >> PICC line discontinued 9/19>> debridement of tricuspid valve/valve reconstruction/annuloplasty 9/27>> transfer to [facility]  Significant studies: 7/25>> CT chest: Multifocal airspace opacities-increasing cavitary nodules 7/25>> MRI thoracic spine: No evidence of infection in thoracic spine.  Significant microbiology data: 7/24>> urine culture: Negative 7/25>> blood culture: 1/2-MSSA. 7/27>> blood culture: No growth 7/29>> blood culture: No growth 9/04>> blood cultures:MRSA 9/07>> blood cultures:neg 9/19>> tricuspid valve tissue culture: Staph aureus-sensitivities pending  Procedures: 8/3>> TEE: 3 vegetations on tricuspid valve-severe TR.  EF 60-65% 9/14>> multiple teeth extraction 9/19>> debridement of tricuspid valve/valve reconstruction/annuloplasty  Consults: ID CT surgery Dentist    Subjective:  No major issues overnight.  No chest pain or shortness of breath.  Objective: Vitals: Blood pressure (!) 106/59, pulse (!) 59, temperature 97.8 F (36.6 C), resp. rate 16, height 5\' 10"  (1.778 m), weight 85.7 kg, SpO2 97 %.   Exam: Gen Exam:Alert awake-not in any distress HEENT:atraumatic, normocephalic Chest: B/L clear to auscultation anteriorly CVS:S1S2 regular Abdomen:soft non tender, non distended Extremities:no edema Neurology: Non focal Skin: no rash   Assessment/Plan: Sepsis due to recurrent MSSA bacteremia with native tricuspid valve endocarditis-s/p valve repair/annuloplasty on 9/9 Sepsis physiology resolved-discussed with infectious disease MD on 9/28 Dr. Melton Alar 6 weeks of Teflaro/daptomycin from 9/19-this will unfortunately have to be done as an inpatient.  Goal INR 2-3-pharmacy now managing Coumadin.  Per CT surgery note-needs Coumadin for 3 months from valve repair.  Normocytic anemia: Due to chronic disease/recurrent endocarditis: No indication for transfusion.  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  Chronic HCV  outpatient follow-up with ID.  Mild transaminitis resolved  Nutrition Status: Nutrition Problem: Inadequate oral intake Etiology: vomiting, nausea Signs/Symptoms: per patient/family report Interventions: Ensure Enlive (each supplement provides 350kcal and 20 grams of protein), MVI, Snacks  BMI: Estimated body mass index is 27.11 kg/m as calculated from the following:   Height as of this encounter: 5\' 10"  (1.778 m).   Weight as of this encounter: 85.7 kg.   Code status:   Code Status: Full Code   DVT Prophylaxis: SCDs Start: 04/24/22 0836 SCDs Start: 04/23/22 1259 warfarin (COUMADIN) tablet 2.5 mg    Family Communication:  Significant other at bedside  Disposition Plan: Status is: Inpatient for 6 weeks from 9/19 to complete 6 weeks of IV antibiotics.   Diet:  Diet Order             DIET DYS 3 Room service appropriate? Yes; Fluid consistency: Thin  Diet effective now                    MEDICATIONS: Scheduled Meds:  Ferrous Fumarate  1 tablet Oral BID WC   And   vitamin B-12  100 mcg Oral BID WC   And   ascorbic acid  500 mg Oral BID WC   And   folic acid  1 mg Oral BID WC   aspirin EC  81 mg Oral Daily   bisacodyl  10 mg Oral Daily   Or   bisacodyl  10 mg Rectal Daily   Chlorhexidine Gluconate Cloth  6 each Topical Daily   docusate sodium  100 mg Oral BID   escitalopram  10 mg Oral Daily   feeding supplement  237 mL Oral BID BM   gabapentin  300 mg Oral TID   methadone  130 mg Oral Daily   metoprolol tartrate  12.5 mg Per Tube BID   multivitamin with minerals  1 tablet Oral Daily   nicotine  14 mg Transdermal Daily   pantoprazole  40 mg Oral Daily   sodium chloride flush  10-40 mL Intracatheter Q12H   warfarin  2.5 mg Oral ONCE-1600   Warfarin - Pharmacist Dosing Inpatient   Does not apply q1600   Continuous Infusions:  ceFTAROline (TEFLARO) IV 600 mg (05/07/22 0616)   DAPTOmycin (CUBICIN) 800 mg in sodium chloride 0.9 % IVPB 800 mg (05/06/22 2040)   PRN Meds:.acetaminophen, alum & mag hydroxide-simeth, bisacodyl, diclofenac Sodium, docusate sodium, hydrOXYzine, loperamide, methocarbamol, metoprolol tartrate, mouth rinse, oxyCODONE, polyethylene glycol, promethazine, sodium chloride flush, sodium chloride flush   I have personally reviewed following labs and imaging studies  LABORATORY DATA: Recent Labs  Lab 05/02/22 0317 05/04/22 0350 05/06/22 0339  WBC 4.4 5.3 5.0  HGB 8.0* 8.2* 8.6*  HCT 25.5* 26.8* 27.3*  PLT 162 183 201  MCV 89.2 89.6 88.3  MCH 28.0 27.4 27.8  MCHC 31.4 30.6 31.5  RDW 15.2 15.1 15.2     Recent Labs  Lab 05/01/22 0835 05/02/22 0317  05/03/22 0359 05/04/22 0350 05/05/22 0543 05/06/22 0339 05/07/22 0310  NA  --  137  --  138  --  139  --   K  --  4.2  --  4.1  --  3.9  --   CL  --  103  --  100  --  102  --   CO2  --  28  --  29  --  28  --   GLUCOSE  --  95  --  97  --  101*  --   BUN  --  20  --  21*  --  13  --   CREATININE 0.89 0.85  --  0.93  --  0.84  --   CALCIUM  --  9.0  --  9.5  --  9.2  --   INR 1.9* 2.0* 2.3* 1.9* 2.5* 3.0* 3.0*     MICROBIOLOGY: No results found for this or any previous visit (from the past 240 hour(s)).    RADIOLOGY STUDIES/RESULTS: No results found.   LOS: 71 days   Signature  Jeoffrey Massed M.D on 05/07/2022 at 11:49 AM   -  To page go to [email or web address]

## 2022-05-07 NOTE — Progress Notes (Signed)
[facility] for Warfarin Indication:  tricuspid valve repair  No Known Allergies  Patient Measurements: Height: 5\' 10"  (177.8 cm) Weight: 85.7 kg (188 lb 15 oz) IBW/kg (Calculated) : 73  Vital Signs: Temp: 97.8 F (36.6 C) (10/03 0814) Temp Source: Oral (10/03 0355) BP: 106/59 (10/03 0814) Pulse Rate: 59 (10/03 0814)  Labs: Recent Labs    05/05/22 0543 05/06/22 0339 05/07/22 0310  HGB  --  8.6*  --   HCT  --  27.3*  --   PLT  --  201  --   LABPROT 26.8* 31.1* 30.9*  INR 2.5* 3.0* 3.0*  CREATININE  --  0.84  --     Estimated Creatinine Clearance: 131.6 mL/min (by C-G formula based on SCr of 0.84 mg/dL).  Assessment: Reginald Clarke is a 31 yr old male s/p tricuspid valve repair on 04/23/22. Warfarin initiated post-op day 1 along with Lovenox 40mg  SQ q24h. Aspirin reduced to 81mg  daily and Lovenox discontinued once INR at goal, naproxen discontinued, Trinsicon BID continued (using components due to backorder). Plan to treat w/ warfarin x 3 months per CT surgery.    INR therapeutic at 3 but at upper end of goal. INR trend with large increase over past few days (1.9 >> 2.5 >> 3). Patient continues on ceftaroline and daptomycin through 10/31 (broad spectrum antibiotics may lead to increased INR). CBC remains stable, no issues with s/sx bleeding reported.   Goal of Therapy:  INR 2-3 Monitor platelets by anticoagulation protocol: Yes   Plan:  Give reduced warfarin 2.5 mg PO x1 dose Check INR daily while on warfarin Continue to monitor H&H and platelets    Thank you for allowing pharmacy to be a part of this patient's care.  Ardyth Harps, PharmD Clinical Pharmacist

## 2022-05-08 DIAGNOSIS — I079 Rheumatic tricuspid valve disease, unspecified: Secondary | ICD-10-CM | POA: Diagnosis not present

## 2022-05-08 LAB — PROTIME-INR
INR: 2.2 — ABNORMAL HIGH (ref 0.8–1.2)
Prothrombin Time: 23.8 seconds — ABNORMAL HIGH (ref 11.4–15.2)

## 2022-05-08 MED ORDER — WARFARIN SODIUM 5 MG PO TABS
5.0000 mg | ORAL_TABLET | Freq: Once | ORAL | Status: AC
  Administered 2022-05-08: 5 mg via ORAL
  Filled 2022-05-08: qty 1

## 2022-05-08 NOTE — Progress Notes (Signed)
Mobility Specialist Progress Note:   05/08/22 1129  Mobility  Activity Ambulated independently in hallway  Activity Response Tolerated well  Distance Ambulated (ft) 500 ft  $Mobility charge 1 Mobility  Level of Assistance Independent  Assistive Device None   Pt received in bed and agreeable. No complaints. Pt left ambulating in room with all needs met and call bell in reach.   Courtney Fenlon Mobility Specialist-Acute Rehab Secure Chat only

## 2022-05-08 NOTE — Progress Notes (Signed)
PROGRESS NOTE        PATIENT DETAILS Name: Reginald Clarke Age: 31 y.o. Sex: male Date of Birth: [birth year] Admit Date: 02/25/2022 Admitting Physician [name], MD [id number], Randa Evens, PA-C  Brief Summary: Patient is a 31 y.o.  male with history of recurrent MSSA native tricuspid valve endocarditis in spite of completing several courses of IV antibiotics.  Evaluated by ID/CT surgery-underwent tricuspid valve debridement/repair/annuloplasty on 9/19.  Significant events: 4/12-5/10>> hospitalization for MSSA bacteremia with native TV endocarditis-angio vac procedure on 4/26.Completed cefazolin on 5/10-planned weekly dalbavancin at the ID clinic.   5/22>> outpatient blood culture positive for MSSA 5/24-6/8>> hospitalization [facility] for persistent bacteremia-worsening back pain-discharge to [facility] on IV Ancef. 7/5>> discharged from [facility] on oral cefdinir.  Continued to have intermittent fever postdischarge.   7/24>> admit for-persistent fever/nausea/vomiting/diarrhea-blood cultures positive for MSSA. 9/5>> recurrent fever- blood cultures growing Staph aureus  9/5 >> PICC line discontinued 9/19>> debridement of tricuspid valve/valve reconstruction/annuloplasty 9/27>> transfer to [facility]  Significant studies: 7/25>> CT chest: Multifocal airspace opacities-increasing cavitary nodules 7/25>> MRI thoracic spine: No evidence of infection in thoracic spine.  Significant microbiology data: 7/24>> urine culture: Negative 7/25>> blood culture: 1/2-MSSA. 7/27>> blood culture: No growth 7/29>> blood culture: No growth 9/04>> blood cultures:MRSA 9/07>> blood cultures:neg 9/19>> tricuspid valve tissue culture: Staph aureus-sensitivities pending  Procedures: 8/3>> TEE: 3 vegetations on tricuspid valve-severe TR.  EF 60-65% 9/14>> multiple teeth extraction 9/19>> debridement of tricuspid valve/valve reconstruction/annuloplasty  Consults: ID CT surgery Dentist    Subjective:  No major issues overnight-lying comfortably in bed.  Objective: Vitals: Blood pressure 103/65, pulse 60, temperature 98.6 F (37 C), temperature source Oral, resp. rate (!) 22, height 5\' 10"  (1.778 m), weight 85.7 kg, SpO2 100 %.   Exam: Gen Exam:Alert awake-not in any distress HEENT:atraumatic, normocephalic Chest: B/L clear to auscultation anteriorly CVS:S1S2 regular Abdomen:soft non tender, non distended Extremities:no edema Neurology: Non focal Skin: no rash   Assessment/Plan: Sepsis due to recurrent MSSA bacteremia with native tricuspid valve endocarditis-s/p valve repair/annuloplasty on 9/9 Sepsis physiology resolved-discussed with infectious disease MD on 9/28 Dr. Melton Alar 6 weeks of Teflaro/daptomycin from 9/19-this will unfortunately have to be done as an inpatient.  Goal INR 2-3-pharmacy now managing Coumadin.  Per CT surgery note-needs Coumadin for 3 months from valve repair.  Normocytic anemia: Due to chronic disease/recurrent endocarditis: No indication for transfusion.  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  Chronic HCV  outpatient follow-up with ID.  Mild transaminitis resolved  Nutrition Status: Nutrition Problem: Inadequate oral intake Etiology: vomiting, nausea Signs/Symptoms: per patient/family report Interventions: Ensure Enlive (each supplement provides 350kcal and 20 grams of protein), MVI, Snacks  BMI: Estimated body mass index is 27.11 kg/m as calculated from the following:   Height as of this encounter: 5\' 10"  (1.778 m).   Weight as of this encounter: 85.7 kg.   Code status:   Code Status: Full Code   DVT Prophylaxis: SCDs Start: 04/24/22 0836 SCDs Start: 04/23/22 1259    Family Communication: None at bedside  Disposition  Plan: Status is: Inpatient for 6 weeks from 9/19 to complete 6 weeks of IV antibiotics.   Diet: Diet Order  DIET DYS 3 Room service appropriate? Yes; Fluid consistency: Thin  Diet effective now                    MEDICATIONS: Scheduled Meds:  Ferrous Fumarate  1 tablet Oral BID WC   And   vitamin B-12  100 mcg Oral BID WC   And   ascorbic acid  500 mg Oral BID WC   And   folic acid  1 mg Oral BID WC   aspirin EC  81 mg Oral Daily   bisacodyl  10 mg Oral Daily   Or   bisacodyl  10 mg Rectal Daily   Chlorhexidine Gluconate Cloth  6 each Topical Daily   docusate sodium  100 mg Oral BID   escitalopram  10 mg Oral Daily   feeding supplement  237 mL Oral BID BM   gabapentin  300 mg Oral TID   methadone  125 mg Oral Daily   Followed by   Melene Muller ON 05/11/2022] methadone  117.5 mg Oral Daily   Followed by   Melene Muller ON 05/14/2022] methadone  112.5 mg Oral Daily   Followed by   Melene Muller ON 05/17/2022] methadone  105 mg Oral Daily   Followed by   Melene Muller ON 05/20/2022] methadone  100 mg Oral Daily   Followed by   Melene Muller ON 05/23/2022] methadone  95 mg Oral Daily   Followed by   Melene Muller ON 05/26/2022] methadone  87.5 mg Oral Daily   Followed by   Melene Muller ON 05/29/2022] methadone  82.5 mg Oral Daily   Followed by   Melene Muller ON 06/01/2022] methadone  75 mg Oral Daily   metoprolol tartrate  12.5 mg Per Tube BID   multivitamin with minerals  1 tablet Oral Daily   nicotine  14 mg Transdermal Daily   pantoprazole  40 mg Oral Daily   sodium chloride flush  10-40 mL Intracatheter Q12H   Warfarin - Pharmacist Dosing Inpatient   Does not apply q1600   Continuous Infusions:  ceFTAROline (TEFLARO) IV 600 mg (05/08/22 0610)   DAPTOmycin (CUBICIN) 800 mg in sodium chloride 0.9 % IVPB 800 mg (05/07/22 1935)   PRN Meds:.acetaminophen, alum & mag hydroxide-simeth, bisacodyl, diclofenac Sodium, docusate sodium, hydrOXYzine, loperamide, methocarbamol, metoprolol tartrate, mouth  rinse, oxyCODONE, polyethylene glycol, promethazine, sodium chloride flush, sodium chloride flush   I have personally reviewed following labs and imaging studies  LABORATORY DATA: Recent Labs  Lab 05/02/22 0317 05/04/22 0350 05/06/22 0339  WBC 4.4 5.3 5.0  HGB 8.0* 8.2* 8.6*  HCT 25.5* 26.8* 27.3*  PLT 162 183 201  MCV 89.2 89.6 88.3  MCH 28.0 27.4 27.8  MCHC 31.4 30.6 31.5  RDW 15.2 15.1 15.2     Recent Labs  Lab 05/02/22 0317 05/03/22 0359 05/04/22 0350 05/05/22 0543 05/06/22 0339 05/07/22 0310  NA 137  --  138  --  139  --   K 4.2  --  4.1  --  3.9  --   CL 103  --  100  --  102  --   CO2 28  --  29  --  28  --   GLUCOSE 95  --  97  --  101*  --   BUN 20  --  21*  --  13  --   CREATININE 0.85  --  0.93  --  0.84  --   CALCIUM 9.0  --  9.5  --  9.2  --  INR 2.0* 2.3* 1.9* 2.5* 3.0* 3.0*     MICROBIOLOGY: No results found for this or any previous visit (from the past 240 hour(s)).    RADIOLOGY STUDIES/RESULTS: No results found.   LOS: 72 days   Signature  Oren Binet M.D on 05/08/2022 at 11:48 AM   -  To page go to [email or web address]

## 2022-05-08 NOTE — Progress Notes (Signed)
[facility] for Warfarin Indication:  tricuspid valve repair  No Known Allergies  Patient Measurements: Height: 5\' 10"  (177.8 cm) Weight: 85.7 kg (188 lb 15 oz) IBW/kg (Calculated) : 73  Vital Signs: Temp: 98.6 F (37 C) (10/04 0817) Temp Source: Oral (10/04 0817) BP: 103/65 (10/04 0817)  Labs: Recent Labs    05/06/22 0339 05/07/22 0310 05/08/22 1505  HGB 8.6*  --   --   HCT 27.3*  --   --   PLT 201  --   --   LABPROT 31.1* 30.9* 23.8*  INR 3.0* 3.0* 2.2*  CREATININE 0.84  --   --     Estimated Creatinine Clearance: 131.6 mL/min (by C-G formula based on SCr of 0.84 mg/dL).  Assessment: Reginald Clarke is a 31 yr old male s/p tricuspid valve repair on 04/23/22. Warfarin initiated post-op day 1 along with Lovenox 40mg  SQ q24h. Aspirin reduced to 81mg  daily and Lovenox discontinued once INR at goal, naproxen discontinued, Trinsicon BID continued (using components due to backorder). Plan to treat w/ warfarin x 3 months per CT surgery.    Patient continues on ceftaroline and daptomycin through 10/31 (broad spectrum antibiotics may lead to increased INR). CBC remains stable, no issues with s/sx bleeding reported. INR therapeutic 2.2 with large drop. Will increase back to 5mg  daily.   Goal of Therapy:  INR 2-3 Monitor platelets by anticoagulation protocol: Yes   Plan:  Give warfarin 5 mg PO x1 dose Check INR daily while on warfarin Continue to monitor H&H and platelets    Thank you for allowing pharmacy to be a part of this patient's care.  Ardyth Harps, PharmD Clinical Pharmacist

## 2022-05-09 DIAGNOSIS — I079 Rheumatic tricuspid valve disease, unspecified: Secondary | ICD-10-CM | POA: Diagnosis not present

## 2022-05-09 LAB — PROTIME-INR
INR: 2.5 — ABNORMAL HIGH (ref 0.8–1.2)
Prothrombin Time: 27 seconds — ABNORMAL HIGH (ref 11.4–15.2)

## 2022-05-09 MED ORDER — WARFARIN SODIUM 2.5 MG PO TABS
2.5000 mg | ORAL_TABLET | Freq: Once | ORAL | Status: AC
  Administered 2022-05-09: 2.5 mg via ORAL
  Filled 2022-05-09: qty 1

## 2022-05-09 NOTE — Progress Notes (Signed)
[name] for Warfarin Indication:  tricuspid valve repair  No Known Allergies  Patient Measurements: Height: 5\' 10"  (177.8 cm) Weight: 85.7 kg (188 lb 15 oz) IBW/kg (Calculated) : 73  Vital Signs: Temp: 98.4 F (36.9 C) (10/05 0545) Temp Source: Oral (10/05 0545) BP: 98/52 (10/05 0545) Pulse Rate: 68 (10/05 0545)  Labs: Recent Labs    05/07/22 0310 05/08/22 1505 05/09/22 0401  LABPROT 30.9* 23.8* 27.0*  INR 3.0* 2.2* 2.5*    Estimated Creatinine Clearance: 131.6 mL/min (by C-G formula based on SCr of 0.84 mg/dL).  Assessment: Reginald Clarke is a 31 yr old male s/p tricuspid valve repair on 04/23/22. Warfarin initiated post-op day 1 along with Lovenox 40mg  SQ q24h. Aspirin reduced to 81mg  daily and Lovenox discontinued once INR at goal, naproxen discontinued, Trinsicon BID continued (using components due to backorder). Plan to treat w/ warfarin x 3 months per CT surgery.    Patient continues on ceftaroline and daptomycin through 10/31 (broad spectrum antibiotics may lead to increased INR). CBC remains stable, no issues with s/sx bleeding reported. INR remains therapeutic with moderate fluctuations, 2.2 > 2.5.   Goal of Therapy:  INR 2-3 Monitor platelets by anticoagulation protocol: Yes   Plan:  Give warfarin 2.5 mg PO x1 dose Check INR daily while on warfarin Continue to monitor H&H and platelets    Thank you for allowing pharmacy to be a part of this patient's care.  Ardyth Harps, PharmD Clinical Pharmacist

## 2022-05-09 NOTE — Progress Notes (Signed)
PROGRESS NOTE        PATIENT DETAILS Name: Reginald Clarke Age: 31 y.o. Sex: male Date of Birth: [birth year] Admit Date: 02/25/2022 Admitting Physician Dorcas Carrow, MD [id number], [name], PA-C  Brief Summary: Patient is a 31 y.o.  male with history of recurrent MSSA native tricuspid valve endocarditis in spite of completing several courses of IV antibiotics.  Evaluated by ID/CT surgery-underwent tricuspid valve debridement/repair/annuloplasty on 9/19.  Significant events: 4/12-5/10>> hospitalization for MSSA bacteremia with native TV endocarditis-angio vac procedure on 4/26.Completed cefazolin on 5/10-planned weekly dalbavancin at the ID clinic.   5/22>> outpatient blood culture positive for MSSA 5/24-6/8>> hospitalization [facility] for persistent bacteremia-worsening back pain-discharge to [facility] on IV Ancef. 7/5>> discharged from [facility] on oral cefdinir.  Continued to have intermittent fever postdischarge.   7/24>> admit for-persistent fever/nausea/vomiting/diarrhea-blood cultures positive for MSSA. 9/5>> recurrent fever- blood cultures growing Staph aureus  9/5 >> PICC line discontinued 9/19>> debridement of tricuspid valve/valve reconstruction/annuloplasty 9/27>> transfer to [facility]  Significant studies: 7/25>> CT chest: Multifocal airspace opacities-increasing cavitary nodules 7/25>> MRI thoracic spine: No evidence of infection in thoracic spine.  Significant microbiology data: 7/24>> urine culture: Negative 7/25>> blood culture: 1/2-MSSA. 7/27>> blood culture: No growth 7/29>> blood culture: No growth 9/04>> blood cultures:MRSA 9/07>> blood cultures:neg 9/19>> tricuspid valve tissue culture: Staph aureus-sensitivities pending  Procedures: 8/3>> TEE: 3 vegetations on tricuspid valve-severe TR.  EF 60-65% 9/14>> multiple teeth extraction 9/19>> debridement of tricuspid valve/valve reconstruction/annuloplasty  Consults: ID CT surgery Dentist    Subjective:  Lying comfortably in bed-no major issues overnight.  Objective: Vitals: Blood pressure 118/61, pulse 69, temperature 98.7 F (37.1 C), temperature source Oral, resp. rate 16, height 5\' 10"  (1.778 m), weight 85.7 kg, SpO2 98 %.   Exam: Gen Exam:Alert awake-not in any distress HEENT:atraumatic, normocephalic Chest: B/L clear to auscultation anteriorly CVS:S1S2 regular Abdomen:soft non tender, non distended Extremities:no edema Neurology: Non focal Skin: no rash   Assessment/Plan: Sepsis due to recurrent MSSA bacteremia with native tricuspid valve endocarditis-s/p valve repair/annuloplasty on 9/9 Sepsis physiology resolved-discussed with infectious disease MD on 9/28 Dr. 10/28 6 weeks of Teflaro/daptomycin from 9/19-this will unfortunately have to be done as an inpatient.  Goal INR 2-3-pharmacy now managing Coumadin.  Per CT surgery note-needs Coumadin for 3 months from valve repair.  Normocytic anemia: Due to chronic disease/recurrent endocarditis: No indication for transfusion.  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  Chronic HCV  outpatient follow-up with ID.  Mild transaminitis resolved  Nutrition Status: Nutrition Problem: Inadequate oral intake Etiology: vomiting, nausea Signs/Symptoms: per patient/family report Interventions: Ensure Enlive (each supplement provides 350kcal and 20 grams of protein), MVI, Snacks  BMI: Estimated body mass index is 27.11 kg/m as calculated from the following:   Height as of this encounter: 5\' 10"  (1.778 m).   Weight as of this encounter: 85.7 kg.   Code status:   Code Status: Full Code   DVT Prophylaxis: SCDs Start: 04/24/22 0836 SCDs Start: 04/23/22 1259    Family Communication: None at bedside  Disposition  Plan: Status is: Inpatient for 6 weeks from 9/19 to complete 6 weeks of IV antibiotics.   Diet: Diet Order  DIET DYS 3 Room service appropriate? Yes; Fluid consistency: Thin  Diet effective now                    MEDICATIONS: Scheduled Meds:  Ferrous Fumarate  1 tablet Oral BID WC   And   vitamin B-12  100 mcg Oral BID WC   And   ascorbic acid  500 mg Oral BID WC   And   folic acid  1 mg Oral BID WC   aspirin EC  81 mg Oral Daily   bisacodyl  10 mg Oral Daily   Or   bisacodyl  10 mg Rectal Daily   Chlorhexidine Gluconate Cloth  6 each Topical Daily   docusate sodium  100 mg Oral BID   escitalopram  10 mg Oral Daily   feeding supplement  237 mL Oral BID BM   gabapentin  300 mg Oral TID   methadone  125 mg Oral Daily   Followed by   Derrill Memo ON 05/11/2022] methadone  117.5 mg Oral Daily   Followed by   Derrill Memo ON 05/14/2022] methadone  112.5 mg Oral Daily   Followed by   Derrill Memo ON 05/17/2022] methadone  105 mg Oral Daily   Followed by   Derrill Memo ON 05/20/2022] methadone  100 mg Oral Daily   Followed by   Derrill Memo ON 05/23/2022] methadone  95 mg Oral Daily   Followed by   Derrill Memo ON 05/26/2022] methadone  87.5 mg Oral Daily   Followed by   Derrill Memo ON 05/29/2022] methadone  82.5 mg Oral Daily   Followed by   Derrill Memo ON 06/01/2022] methadone  75 mg Oral Daily   metoprolol tartrate  12.5 mg Per Tube BID   multivitamin with minerals  1 tablet Oral Daily   nicotine  14 mg Transdermal Daily   pantoprazole  40 mg Oral Daily   sodium chloride flush  10-40 mL Intracatheter Q12H   Warfarin - Pharmacist Dosing Inpatient   Does not apply q1600   Continuous Infusions:  ceFTAROline (TEFLARO) IV 600 mg (05/09/22 0548)   DAPTOmycin (CUBICIN) 800 mg in sodium chloride 0.9 % IVPB 800 mg (05/08/22 2044)   PRN Meds:.acetaminophen, alum & mag hydroxide-simeth, bisacodyl, diclofenac Sodium, docusate sodium, hydrOXYzine, loperamide, methocarbamol, metoprolol tartrate, mouth  rinse, oxyCODONE, polyethylene glycol, promethazine, sodium chloride flush, sodium chloride flush   I have personally reviewed following labs and imaging studies  LABORATORY DATA: Recent Labs  Lab 05/04/22 0350 05/06/22 0339  WBC 5.3 5.0  HGB 8.2* 8.6*  HCT 26.8* 27.3*  PLT 183 201  MCV 89.6 88.3  MCH 27.4 27.8  MCHC 30.6 31.5  RDW 15.1 15.2     Recent Labs  Lab 05/04/22 0350 05/05/22 0543 05/06/22 0339 05/07/22 0310 05/08/22 1505 05/09/22 0401  NA 138  --  139  --   --   --   K 4.1  --  3.9  --   --   --   CL 100  --  102  --   --   --   CO2 29  --  28  --   --   --   GLUCOSE 97  --  101*  --   --   --   BUN 21*  --  13  --   --   --   CREATININE 0.93  --  0.84  --   --   --   CALCIUM 9.5  --  9.2  --   --   --  INR 1.9* 2.5* 3.0* 3.0* 2.2* 2.5*     MICROBIOLOGY: No results found for this or any previous visit (from the past 240 hour(s)).    RADIOLOGY STUDIES/RESULTS: No results found.   LOS: 73 days   Signature  Jeoffrey Massed M.D on 05/09/2022 at 12:10 PM   -  To page go to [email or web address]

## 2022-05-10 DIAGNOSIS — I079 Rheumatic tricuspid valve disease, unspecified: Secondary | ICD-10-CM | POA: Diagnosis not present

## 2022-05-10 DIAGNOSIS — B182 Chronic viral hepatitis C: Secondary | ICD-10-CM | POA: Diagnosis not present

## 2022-05-10 DIAGNOSIS — I330 Acute and subacute infective endocarditis: Secondary | ICD-10-CM | POA: Diagnosis not present

## 2022-05-10 LAB — PROTIME-INR
INR: 2.5 — ABNORMAL HIGH (ref 0.8–1.2)
Prothrombin Time: 26.8 seconds — ABNORMAL HIGH (ref 11.4–15.2)

## 2022-05-10 MED ORDER — WARFARIN SODIUM 4 MG PO TABS
4.0000 mg | ORAL_TABLET | Freq: Once | ORAL | Status: AC
  Administered 2022-05-10: 4 mg via ORAL
  Filled 2022-05-10: qty 1

## 2022-05-10 NOTE — Progress Notes (Signed)
[facility] for Warfarin Indication:  tricuspid valve repair  No Known Allergies  Patient Measurements: Height: 5\' 10"  (177.8 cm) Weight: 85.1 kg (187 lb 9.8 oz) IBW/kg (Calculated) : 73  Vital Signs: Temp: 98.1 F (36.7 C) (10/06 0523) Temp Source: Oral (10/06 0523) BP: 97/50 (10/06 0843) Pulse Rate: 62 (10/06 0843)  Labs: Recent Labs    05/08/22 1505 05/09/22 0401 05/10/22 0309  LABPROT 23.8* 27.0* 26.8*  INR 2.2* 2.5* 2.5*    Estimated Creatinine Clearance: 131.6 mL/min (by C-G formula based on SCr of 0.84 mg/dL).  Assessment: Reginald Clarke is a 31 yr old male s/p tricuspid valve repair on 04/23/22. Warfarin initiated post-op day 1 along with Lovenox 40mg  SQ q24h. Aspirin reduced to 81mg  daily and Lovenox discontinued once INR at goal, naproxen discontinued, Trinsicon BID continued (using components due to backorder). Plan to treat w/ warfarin x 3 months per CT surgery.    Patient continues on ceftaroline and daptomycin through 10/31 (broad spectrum antibiotics may lead to increased INR). CBC remains stable, no issues with s/sx bleeding reported. INR remains therapeutic with moderate fluctuations, now 2.5 x2 days.  Goal of Therapy:  INR 2-3 Monitor platelets by anticoagulation protocol: Yes   Plan:  Give warfarin 4 mg PO x1 dose Check INR daily while on warfarin Continue to monitor H&H and platelets    Thank you for allowing pharmacy to be a part of this patient's care.  Ardyth Harps, PharmD Clinical Pharmacist

## 2022-05-10 NOTE — Plan of Care (Signed)
°  Problem: Health Behavior/Discharge Planning: °Goal: Ability to manage health-related needs will improve °Outcome: Progressing °  °Problem: Clinical Measurements: °Goal: Ability to maintain clinical measurements within normal limits will improve °Outcome: Progressing °  °Problem: Clinical Measurements: °Goal: Will remain free from infection °Outcome: Progressing °  °Problem: Clinical Measurements: °Goal: Diagnostic test results will improve °Outcome: Progressing °  °Problem: Nutrition: °Goal: Adequate nutrition will be maintained °Outcome: Progressing °  °

## 2022-05-10 NOTE — Progress Notes (Signed)
PROGRESS NOTE        PATIENT DETAILS Name: Reginald Clarke Age: 31 y.o. Sex: male Date of Birth: 1991-01-21 Admit Date: 02/25/2022 Admitting Physician Dorcas Carrow, MD EPP:[id number], [name], PA-C  Brief Summary: Patient is a 31 y.o.  male with history of recurrent MSSA native tricuspid valve endocarditis in spite of completing several courses of IV antibiotics.  Evaluated by ID/CT surgery-underwent tricuspid valve debridement/repair/annuloplasty on 9/19.  Significant events: 4/12-5/10>> hospitalization for MSSA bacteremia with native TV endocarditis-angio vac procedure on 4/26.Completed cefazolin on 5/10-planned weekly dalbavancin at the ID clinic.   5/22>> outpatient blood culture positive for MSSA 5/24-6/8>> hospitalization [facility] for persistent bacteremia-worsening back pain-discharge to [facility] on IV Ancef. 7/5>> discharged from [facility] on oral cefdinir.  Continued to have intermittent fever postdischarge.   7/24>> admit for-persistent fever/nausea/vomiting/diarrhea-blood cultures positive for MSSA. 9/5>> recurrent fever- blood cultures growing Staph aureus  9/5 >> PICC line discontinued 9/19>> debridement of tricuspid valve/valve reconstruction/annuloplasty 9/27>> [facility]  Significant studies: 7/25>> CT chest: Multifocal airspace opacities-increasing cavitary nodules 7/25>> MRI thoracic spine: No evidence of infection in thoracic spine.  Significant microbiology data: 7/24>> urine culture: Negative 7/25>> blood culture: 1/2-MSSA. 7/27>> blood culture: No growth 7/29>> blood culture: No growth 9/04>> blood cultures:MRSA 9/07>> blood cultures:neg 9/19>> tricuspid valve tissue culture: Staph aureus-sensitivities pending  Procedures: 8/3>> TEE: 3 vegetations on tricuspid valve-severe TR.  EF 60-65% 9/14>> multiple teeth extraction 9/19>> debridement of tricuspid valve/valve reconstruction/annuloplasty  Consults: ID CT surgery Dentist    Subjective:  Lying comfortably in bed-no chest pain/shortness of breath.  Objective: Vitals: Blood pressure (!) 97/50, pulse 62, temperature 98.1 F (36.7 C), temperature source Oral, resp. rate 14, height 5\' 10"  (1.778 m), weight 85.1 kg, SpO2 100 %.   Exam: Gen Exam:Alert awake-not in any distress HEENT:atraumatic, normocephalic Chest: B/L clear to auscultation anteriorly CVS:S1S2 regular Abdomen:soft non tender, non distended Extremities:no edema Neurology: Non focal Skin: no rash   Assessment/Plan: Sepsis due to recurrent MSSA bacteremia with native tricuspid valve endocarditis-s/p valve repair/annuloplasty on 9/9 Sepsis physiology resolved-discussed with infectious disease MD on 9/28 Dr. 10/28 6 weeks of Teflaro/daptomycin from 9/19-this will unfortunately have to be done as an inpatient.  Goal INR 2-3-pharmacy now managing Coumadin.  Per CT surgery note-needs Coumadin for 3 months from valve repair.  Normocytic anemia: Due to chronic disease/recurrent endocarditis: No indication for transfusion.  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  Chronic HCV  outpatient follow-up with ID.  Mild transaminitis resolved  Nutrition Status: Nutrition Problem: Inadequate oral intake Etiology: vomiting, nausea Signs/Symptoms: per patient/family report Interventions: Ensure Enlive (each supplement provides 350kcal and 20 grams of protein), MVI, Snacks  BMI: Estimated body mass index is 26.92 kg/m as calculated from the following:   Height as of this encounter: 5\' 10"  (1.778 m).   Weight as of this encounter: 85.1 kg.   Code status:   Code Status: Full Code   DVT Prophylaxis: SCDs Start: 04/24/22 0836 SCDs Start: 04/23/22 1259 warfarin (COUMADIN) tablet 4 mg    Family  Communication: None at bedside  Disposition Plan: Status is: Inpatient for 6 weeks from 9/19 to complete 6 weeks of IV antibiotics.   Diet: Diet Order  DIET DYS 3 Room service appropriate? Yes; Fluid consistency: Thin  Diet effective now                    MEDICATIONS: Scheduled Meds:  Ferrous Fumarate  1 tablet Oral BID WC   And   vitamin B-12  100 mcg Oral BID WC   And   ascorbic acid  500 mg Oral BID WC   And   folic acid  1 mg Oral BID WC   aspirin EC  81 mg Oral Daily   bisacodyl  10 mg Oral Daily   Or   bisacodyl  10 mg Rectal Daily   Chlorhexidine Gluconate Cloth  6 each Topical Daily   docusate sodium  100 mg Oral BID   escitalopram  10 mg Oral Daily   feeding supplement  237 mL Oral BID BM   gabapentin  300 mg Oral TID   [START ON 05/11/2022] methadone  117.5 mg Oral Daily   Followed by   Melene Muller ON 05/14/2022] methadone  112.5 mg Oral Daily   Followed by   Melene Muller ON 05/17/2022] methadone  105 mg Oral Daily   Followed by   Melene Muller ON 05/20/2022] methadone  100 mg Oral Daily   Followed by   Melene Muller ON 05/23/2022] methadone  95 mg Oral Daily   Followed by   Melene Muller ON 05/26/2022] methadone  87.5 mg Oral Daily   Followed by   Melene Muller ON 05/29/2022] methadone  82.5 mg Oral Daily   Followed by   Melene Muller ON 06/01/2022] methadone  75 mg Oral Daily   metoprolol tartrate  12.5 mg Per Tube BID   multivitamin with minerals  1 tablet Oral Daily   nicotine  14 mg Transdermal Daily   pantoprazole  40 mg Oral Daily   sodium chloride flush  10-40 mL Intracatheter Q12H   warfarin  4 mg Oral ONCE-1600   Warfarin - Pharmacist Dosing Inpatient   Does not apply q1600   Continuous Infusions:  ceFTAROline (TEFLARO) IV 100 mL/hr at 05/10/22 0536   DAPTOmycin (CUBICIN) 800 mg in sodium chloride 0.9 % IVPB Stopped (05/09/22 2050)   PRN Meds:.acetaminophen, alum & mag hydroxide-simeth, bisacodyl, diclofenac Sodium, docusate sodium, hydrOXYzine, loperamide,  methocarbamol, metoprolol tartrate, mouth rinse, oxyCODONE, polyethylene glycol, promethazine, sodium chloride flush, sodium chloride flush   I have personally reviewed following labs and imaging studies  LABORATORY DATA: Recent Labs  Lab 05/04/22 0350 05/06/22 0339  WBC 5.3 5.0  HGB 8.2* 8.6*  HCT 26.8* 27.3*  PLT 183 201  MCV 89.6 88.3  MCH 27.4 27.8  MCHC 30.6 31.5  RDW 15.1 15.2     Recent Labs  Lab 05/04/22 0350 05/05/22 0543 05/06/22 0339 05/07/22 0310 05/08/22 1505 05/09/22 0401 05/10/22 0309  NA 138  --  139  --   --   --   --   K 4.1  --  3.9  --   --   --   --   CL 100  --  102  --   --   --   --   CO2 29  --  28  --   --   --   --   GLUCOSE 97  --  101*  --   --   --   --   BUN 21*  --  13  --   --   --   --   CREATININE 0.93  --  0.84  --   --   --   --  CALCIUM 9.5  --  9.2  --   --   --   --   INR 1.9*   < > 3.0* 3.0* 2.2* 2.5* 2.5*   < > = values in this interval not displayed.     MICROBIOLOGY: No results found for this or any previous visit (from the past 240 hour(s)).    RADIOLOGY STUDIES/RESULTS: No results found.   LOS: 74 days   Signature  Oren Binet M.D on 05/10/2022 at 12:58 PM   -  To page go to [email or web address]

## 2022-05-10 NOTE — Progress Notes (Signed)
[facility] for Infectious Disease  Date of Admission:  02/25/2022           Reason for visit: Follow up on tricuspid valve endocarditis   Current antibiotics: Daptomycin Ceftaroline  ASSESSMENT:    31 y.o. male admitted with:  #MSSA/MRSA tricuspid valve endocarditis Status post repair with tricuspid valve annuloplasty ring on 9/19 with CT surgery.  Currently on daptomycin and ceftaroline combination therapy.  Valve tissue cultures isolated MSSA in the setting of prior MSSA endocarditis and subsequent MRSA infection as well.  #Chronic hepatitis C, genotype 2B Treatment deferred to be done as an outpatient.  RECOMMENDATIONS:    Continue dual therapy with daptomycin and ceftaroline Planning for 6 weeks of antibiotic therapy from the date of his valve surgery Hepatitis C treatment will be completed as an outpatient ID will continue to follow.  New ID team will take over on Monday. If any questions arise over the weekend, Dr. Gale Journey will be around.   Principal Problem:   Endocarditis of tricuspid valve Active Problems:   MRSA bacteremia   Chronic pain syndrome/methadone abuse    Tobacco abuse   Substance use disorder   Hepatitis C, chronic ([name])   Anxiety   Normocytic anemia   Prolonged QT interval   Sepsis ([name])   Elevated serum creatinine   Hypokalemia   Hypomagnesemia   Hyponatremia   Acute bacterial endocarditis   MSSA bacteremia   IVDU (intravenous drug user)   Pneumonia of both lower lobes due to methicillin resistant Staphylococcus aureus (MRSA) ([name])   Thrombocytopenia ([name])   Encounter for preoperative dental examination   Caries   Retained tooth root   Chronic apical periodontitis   Chronic periodontitis   Accretions on teeth   Impacted third molar tooth   Teeth missing   Phobia of dental procedure   Shock ([place])   Acute respiratory failure ([name])   Pleural effusion    MEDICATIONS:    Scheduled Meds:  Ferrous Fumarate  1 tablet Oral BID  WC   And   vitamin B-12  100 mcg Oral BID WC   And   ascorbic acid  500 mg Oral BID WC   And   folic acid  1 mg Oral BID WC   aspirin EC  81 mg Oral Daily   bisacodyl  10 mg Oral Daily   Or   bisacodyl  10 mg Rectal Daily   Chlorhexidine Gluconate Cloth  6 each Topical Daily   docusate sodium  100 mg Oral BID   escitalopram  10 mg Oral Daily   feeding supplement  237 mL Oral BID BM   gabapentin  300 mg Oral TID   methadone  125 mg Oral Daily   Followed by   Derrill Memo ON 05/11/2022] methadone  117.5 mg Oral Daily   Followed by   Derrill Memo ON 05/14/2022] methadone  112.5 mg Oral Daily   Followed by   Derrill Memo ON 05/17/2022] methadone  105 mg Oral Daily   Followed by   Derrill Memo ON 05/20/2022] methadone  100 mg Oral Daily   Followed by   Derrill Memo ON 05/23/2022] methadone  95 mg Oral Daily   Followed by   Derrill Memo ON 05/26/2022] methadone  87.5 mg Oral Daily   Followed by   Derrill Memo ON 05/29/2022] methadone  82.5 mg Oral Daily   Followed by   Derrill Memo ON 06/01/2022] methadone  75 mg Oral Daily   metoprolol tartrate  12.5 mg Per Tube BID  multivitamin with minerals  1 tablet Oral Daily   nicotine  14 mg Transdermal Daily   pantoprazole  40 mg Oral Daily   sodium chloride flush  10-40 mL Intracatheter Q12H   Warfarin - Pharmacist Dosing Inpatient   Does not apply q1600   Continuous Infusions:  ceFTAROline (TEFLARO) IV 100 mL/hr at 05/10/22 0536   DAPTOmycin (CUBICIN) 800 mg in sodium chloride 0.9 % IVPB Stopped (05/09/22 2050)   PRN Meds:.acetaminophen, alum & mag hydroxide-simeth, bisacodyl, diclofenac Sodium, docusate sodium, hydrOXYzine, loperamide, methocarbamol, metoprolol tartrate, mouth rinse, oxyCODONE, polyethylene glycol, promethazine, sodium chloride flush, sodium chloride flush  SUBJECTIVE:   24 hour events:  No acute events.  Patient continues on daptomycin and ceftaroline for planned 6 weeks of therapy from date of his valve surgery on [id number] for complicated MSSA/MRSA  endocarditis  No new complaints Keeping himself occupied States his girlfriend just got a job in food services at [employer] She has been coming to visit him regularly Reports that he will be going back to live with his grandparents upon discharge  Review of Systems  All other systems reviewed and are negative.     OBJECTIVE:   Blood pressure (!) 97/55, pulse 67, temperature 98.1 F (36.7 C), temperature source Oral, resp. rate 16, height 5\' 10"  (1.778 m), weight 85.1 kg, SpO2 100 %. Body mass index is 26.92 kg/m.  Physical Exam Constitutional:      Appearance: Normal appearance.  Eyes:     Extraocular Movements: Extraocular movements intact.     Conjunctiva/sclera: Conjunctivae normal.  Pulmonary:     Effort: Pulmonary effort is normal. No respiratory distress.  Skin:    General: Skin is warm and dry.  Neurological:     General: No focal deficit present.     Mental Status: He is alert and oriented to person, place, and time.  Psychiatric:        Mood and Affect: Mood normal.        Behavior: Behavior normal.      Lab Results: Lab Results  Component Value Date   WBC 5.0 05/06/2022   HGB 8.6 (L) 05/06/2022   HCT 27.3 (L) 05/06/2022   MCV 88.3 05/06/2022   PLT 201 05/06/2022    Lab Results  Component Value Date   NA 139 05/06/2022   K 3.9 05/06/2022   CO2 28 05/06/2022   GLUCOSE 101 (H) 05/06/2022   BUN 13 05/06/2022   CREATININE 0.84 05/06/2022   CALCIUM 9.2 05/06/2022   GFRNONAA >60 05/06/2022    Lab Results  Component Value Date   ALT 31 04/25/2022   AST 34 04/25/2022   GGT 27 12/24/2021   ALKPHOS 52 04/25/2022   BILITOT 0.3 04/25/2022       Component Value Date/Time   CRP 12.1 (H) 12/26/2021 1216       Component Value Date/Time   ESRSEDRATE 49 (H) 12/26/2021 1216     I have reviewed the micro and lab results in Epic.  Imaging: No results found.   Imaging independently reviewed in Epic.    Raynelle Highland for Infectious [facility] [phone] pager 05/10/2022, 8:13 AM  I have personally spent 50 minutes involved in face-to-face and non-face-to-face activities for this patient on the day of the visit. Professional time spent includes the following activities: Preparing to see the patient (review of tests), Obtaining and/or reviewing separately obtained history (admission/discharge record), Performing a medically appropriate examination and/or evaluation ,  Ordering medications/tests/procedures, referring and communicating with other health care professionals, Documenting clinical information in the EMR, Independently interpreting results (not separately reported), Communicating results to the patient/family/caregiver, Counseling and educating the patient/family/caregiver and Care coordination (not separately reported).

## 2022-05-11 DIAGNOSIS — I079 Rheumatic tricuspid valve disease, unspecified: Secondary | ICD-10-CM | POA: Diagnosis not present

## 2022-05-11 LAB — CBC WITH DIFFERENTIAL/PLATELET
Abs Immature Granulocytes: 0.01 10*3/uL (ref 0.00–0.07)
Basophils Absolute: 0 10*3/uL (ref 0.0–0.1)
Basophils Relative: 1 %
Eosinophils Absolute: 0.2 10*3/uL (ref 0.0–0.5)
Eosinophils Relative: 4 %
HCT: 27.8 % — ABNORMAL LOW (ref 39.0–52.0)
Hemoglobin: 8.5 g/dL — ABNORMAL LOW (ref 13.0–17.0)
Immature Granulocytes: 0 %
Lymphocytes Relative: 30 %
Lymphs Abs: 1.4 10*3/uL (ref 0.7–4.0)
MCH: 26.9 pg (ref 26.0–34.0)
MCHC: 30.6 g/dL (ref 30.0–36.0)
MCV: 88 fL (ref 80.0–100.0)
Monocytes Absolute: 0.7 10*3/uL (ref 0.1–1.0)
Monocytes Relative: 15 %
Neutro Abs: 2.3 10*3/uL (ref 1.7–7.7)
Neutrophils Relative %: 50 %
Platelets: 275 10*3/uL (ref 150–400)
RBC: 3.16 MIL/uL — ABNORMAL LOW (ref 4.22–5.81)
RDW: 14.8 % (ref 11.5–15.5)
WBC: 4.6 10*3/uL (ref 4.0–10.5)
nRBC: 0 % (ref 0.0–0.2)

## 2022-05-11 LAB — COMPREHENSIVE METABOLIC PANEL WITH GFR
ALT: 27 U/L (ref 0–44)
AST: 25 U/L (ref 15–41)
Albumin: 2.7 g/dL — ABNORMAL LOW (ref 3.5–5.0)
Alkaline Phosphatase: 87 U/L (ref 38–126)
Anion gap: 5 (ref 5–15)
BUN: 16 mg/dL (ref 6–20)
CO2: 28 mmol/L (ref 22–32)
Calcium: 8.7 mg/dL — ABNORMAL LOW (ref 8.9–10.3)
Chloride: 104 mmol/L (ref 98–111)
Creatinine, Ser: 0.91 mg/dL (ref 0.61–1.24)
GFR, Estimated: >60 mL/min
Glucose, Bld: 96 mg/dL (ref 70–99)
Potassium: 4.2 mmol/L (ref 3.5–5.1)
Sodium: 137 mmol/L (ref 135–145)
Total Bilirubin: 0.4 mg/dL (ref 0.3–1.2)
Total Protein: 6.3 g/dL — ABNORMAL LOW (ref 6.5–8.1)

## 2022-05-11 LAB — PROTIME-INR
INR: 2.5 — ABNORMAL HIGH (ref 0.8–1.2)
Prothrombin Time: 26.6 seconds — ABNORMAL HIGH (ref 11.4–15.2)

## 2022-05-11 LAB — PHOSPHORUS: Phosphorus: 4.1 mg/dL (ref 2.5–4.6)

## 2022-05-11 LAB — MAGNESIUM: Magnesium: 1.9 mg/dL (ref 1.7–2.4)

## 2022-05-11 MED ORDER — WARFARIN SODIUM 4 MG PO TABS
4.0000 mg | ORAL_TABLET | Freq: Once | ORAL | Status: AC
  Administered 2022-05-11: 4 mg via ORAL
  Filled 2022-05-11: qty 1

## 2022-05-11 NOTE — Progress Notes (Signed)
PROGRESS NOTE        PATIENT DETAILS Name: Reginald Clarke Age: 31 y.o. Sex: male Date of Birth: 1991/05/10 Admit Date: 02/25/2022 Admitting Physician [name], MD [id number], Randa Evens, PA-C  Brief Summary: Patient is a 31 y.o.  male with history of recurrent MSSA native tricuspid valve endocarditis in spite of completing several courses of IV antibiotics.  Evaluated by ID/CT surgery-underwent tricuspid valve debridement/repair/annuloplasty on 9/19.  Significant events: 4/12-5/10>> hospitalization for MSSA bacteremia with native TV endocarditis-angio vac procedure on 4/26.Completed cefazolin on 5/10-planned weekly dalbavancin at the ID clinic.   5/22>> outpatient blood culture positive for MSSA 5/24-6/8>> hospitalization [facility] for persistent bacteremia-worsening back pain-discharge to [facility] on IV Ancef. 7/5>> discharged from [facility] on oral cefdinir.  Continued to have intermittent fever postdischarge.   7/24>> admit for-persistent fever/nausea/vomiting/diarrhea-blood cultures positive for MSSA. 9/5>> recurrent fever- blood cultures growing Staph aureus  9/5 >> PICC line discontinued 9/19>> debridement of tricuspid valve/valve reconstruction/annuloplasty 9/27>> transfer to [facility]  Significant studies: 7/25>> CT chest: Multifocal airspace opacities-increasing cavitary nodules 7/25>> MRI thoracic spine: No evidence of infection in thoracic spine.  Significant microbiology data: 7/24>> urine culture: Negative 7/25>> blood culture: 1/2-MSSA. 7/27>> blood culture: No growth 7/29>> blood culture: No growth 9/04>> blood cultures:MRSA 9/07>> blood cultures:neg 9/19>> tricuspid valve tissue culture: Staph aureus-sensitivities pending  Procedures: 8/3>> TEE: 3 vegetations on tricuspid valve-severe TR.  EF 60-65% 9/14>> multiple teeth extraction 9/19>> debridement of tricuspid valve/valve reconstruction/annuloplasty  Consults: ID CT surgery Dentist    Subjective:   Patient in bed, appears comfortable, denies any headache, no fever, no chest pain or pressure, no shortness of breath , no abdominal pain. No new focal weakness.   Objective: Vitals: Blood pressure (!) 99/55, pulse 71, temperature 98 F (36.7 C), temperature source Oral, resp. rate 17, height 5\' 10"  (1.778 m), weight 85.1 kg, SpO2 98 %.   Exam:  Awake Alert, No new F.N deficits, Normal affect [place].AT,PERRAL Supple Neck, No JVD,   Symmetrical Chest wall movement, Good air movement bilaterally, CTAB RRR,No Gallops, Rubs or new Murmurs,  +ve B.Sounds, Abd Soft, No tenderness,   No Cyanosis, Clubbing or edema    Assessment/Plan: Sepsis due to recurrent MSSA bacteremia with native tricuspid valve endocarditis-s/p valve repair/annuloplasty on 04/13/22 -  Sepsis physiology resolved-discussed with infectious disease MD on 9/28 Dr. Melton Alar 6 weeks of Teflaro/daptomycin stop date 06/04/22 - this will unfortunately have to be done as an inpatient.  Goal INR 2-3-pharmacy now managing Coumadin.  Per CT surgery note-needs Coumadin for 3 months from valve repair.  Normocytic anemia:  Due to chronic disease/recurrent endocarditis: No indication for transfusion.  Anxiety -  Stable-on Lexapro  Methamphetamine use  In remission  Chronic methadone use - Requesting that we slowly taper down his methadone dosage-decreased to 130 mg today-we will continue to taper down over the next few days.  Chronic HCV   - outpatient follow-up with ID.  Mild transaminitis resolved  Nutrition Status: Nutrition Problem: Inadequate oral intake Etiology: vomiting, nausea Signs/Symptoms: per patient/family report Interventions: Ensure Enlive (each supplement provides 350kcal and 20 grams of protein), MVI, Snacks  BMI: Estimated body mass index is 26.92 kg/m as calculated from the following:   Height as of this encounter: 5\' 10"  (1.778 m).   Weight as of this encounter: 85.1 kg.   Code  status:   Code Status: Full  Code   DVT Prophylaxis: SCDs Start: 04/24/22 0836 SCDs Start: 04/23/22 1259    Family Communication: None at bedside  Disposition Plan: Status is: Inpatient for 6 weeks of IV antibiotics stop date 06/04/22.   Diet: Diet Order             DIET DYS 3 Room service appropriate? Yes; Fluid consistency: Thin  Diet effective now                    MEDICATIONS: Scheduled Meds:  Ferrous Fumarate  1 tablet Oral BID WC   And   vitamin B-12  100 mcg Oral BID WC   And   ascorbic acid  500 mg Oral BID WC   And   folic acid  1 mg Oral BID WC   aspirin EC  81 mg Oral Daily   bisacodyl  10 mg Oral Daily   Or   bisacodyl  10 mg Rectal Daily   Chlorhexidine Gluconate Cloth  6 each Topical Daily   docusate sodium  100 mg Oral BID   escitalopram  10 mg Oral Daily   feeding supplement  237 mL Oral BID BM   gabapentin  300 mg Oral TID   methadone  117.5 mg Oral Daily   Followed by   Derrill Memo ON 05/14/2022] methadone  112.5 mg Oral Daily   Followed by   Derrill Memo ON 05/17/2022] methadone  105 mg Oral Daily   Followed by   Derrill Memo ON 05/20/2022] methadone  100 mg Oral Daily   Followed by   Derrill Memo ON 05/23/2022] methadone  95 mg Oral Daily   Followed by   Derrill Memo ON 05/26/2022] methadone  87.5 mg Oral Daily   Followed by   Derrill Memo ON 05/29/2022] methadone  82.5 mg Oral Daily   Followed by   Derrill Memo ON 06/01/2022] methadone  75 mg Oral Daily   multivitamin with minerals  1 tablet Oral Daily   nicotine  14 mg Transdermal Daily   pantoprazole  40 mg Oral Daily   sodium chloride flush  10-40 mL Intracatheter Q12H   Warfarin - Pharmacist Dosing Inpatient   Does not apply q1600   Continuous Infusions:  ceFTAROline (TEFLARO) IV 600 mg (05/11/22 [id number])   DAPTOmycin (CUBICIN) 800 mg in sodium chloride 0.9 % IVPB 800 mg (05/10/22 2017)   PRN Meds:.acetaminophen, alum & mag hydroxide-simeth, bisacodyl, diclofenac Sodium, docusate sodium, hydrOXYzine, loperamide,  methocarbamol, mouth rinse, oxyCODONE, polyethylene glycol, promethazine, sodium chloride flush, sodium chloride flush   I have personally reviewed following labs and imaging studies  LABORATORY DATA:  Recent Labs  Lab 05/06/22 0339 05/11/22 0347  WBC 5.0 4.6  HGB 8.6* 8.5*  HCT 27.3* 27.8*  PLT 201 275  MCV 88.3 88.0  MCH 27.8 26.9  MCHC 31.5 30.6  RDW 15.2 14.8  LYMPHSABS  --  1.4  MONOABS  --  0.7  EOSABS  --  0.2  BASOSABS  --  0.0    Recent Labs  Lab 05/06/22 0339 05/07/22 0310 05/08/22 1505 05/09/22 0401 05/10/22 0309 05/11/22 0347  NA 139  --   --   --   --  137  K 3.9  --   --   --   --  4.2  CL 102  --   --   --   --  104  CO2 28  --   --   --   --  28  GLUCOSE 101*  --   --   --   --  96  BUN 13  --   --   --   --  16  CREATININE 0.84  --   --   --   --  0.91  CALCIUM 9.2  --   --   --   --  8.7*  AST  --   --   --   --   --  25  ALT  --   --   --   --   --  27  ALKPHOS  --   --   --   --   --  87  BILITOT  --   --   --   --   --  0.4  ALBUMIN  --   --   --   --   --  2.7*  MG  --   --   --   --   --  1.9  PHOS  --   --   --   --   --  4.1  INR 3.0* 3.0* 2.2* 2.5* 2.5* 2.5*     LOS: 75 days   Signature  Lala Lund M.D on 05/11/2022 at 8:34 AM   -  To page go to [email or web address]

## 2022-05-11 NOTE — Progress Notes (Signed)
[place] for Warfarin Indication:  tricuspid valve repair  No Known Allergies  Patient Measurements: Height: 5\' 10"  (177.8 cm) Weight: 85.1 kg (187 lb 9.8 oz) IBW/kg (Calculated) : 73  Vital Signs: Temp: 98 F (36.7 C) (10/07 0000) Temp Source: Oral (10/07 0000) BP: 99/55 (10/07 0826) Pulse Rate: 71 (10/07 0000)  Labs: Recent Labs    05/09/22 0401 05/10/22 0309 05/11/22 0347  HGB  --   --  8.5*  HCT  --   --  27.8*  PLT  --   --  275  LABPROT 27.0* 26.8* 26.6*  INR 2.5* 2.5* 2.5*  CREATININE  --   --  0.91     Estimated Creatinine Clearance: 121.4 mL/min (by C-G formula based on SCr of 0.91 mg/dL).  Assessment: Reginald Clarke is a 31 yr old male s/p tricuspid valve repair on 04/23/22. Warfarin initiated post-op day 1 along with Lovenox 40mg  SQ q24h. Aspirin reduced to 81mg  daily and Lovenox discontinued once INR at goal, naproxen discontinued, Trinsicon BID continued (using components due to backorder). Plan to treat w/ warfarin x 3 months per CT surgery.    Patient continues on ceftaroline and daptomycin through 10/31 (broad spectrum antibiotics may lead to increased INR). CBC remains stable, no issues with s/sx bleeding reported. INR remains therapeutic with moderate fluctuations, now 2.5 x3 days.  Goal of Therapy:  INR 2-3 Monitor platelets by anticoagulation protocol: Yes   Plan:  Give warfarin 4 mg PO x1 dose Check INR daily while on warfarin Continue to monitor H&H and platelets   Thank you for allowing pharmacy to be a part of this patient's care.  Jeneen Rinks, Pharm.D PGY1 Pharmacy Resident 05/11/2022 9:39 AM

## 2022-05-12 DIAGNOSIS — I079 Rheumatic tricuspid valve disease, unspecified: Secondary | ICD-10-CM | POA: Diagnosis not present

## 2022-05-12 LAB — PROTIME-INR
INR: 2 — ABNORMAL HIGH (ref 0.8–1.2)
Prothrombin Time: 22.3 seconds — ABNORMAL HIGH (ref 11.4–15.2)

## 2022-05-12 MED ORDER — WARFARIN SODIUM 5 MG PO TABS
5.0000 mg | ORAL_TABLET | Freq: Once | ORAL | Status: AC
  Administered 2022-05-12: 5 mg via ORAL
  Filled 2022-05-12: qty 1

## 2022-05-12 NOTE — Progress Notes (Signed)
PROGRESS NOTE        PATIENT DETAILS Name: Reginald Clarke Age: 31 y.o. Sex: male Date of Birth: 1991-07-13 Admit Date: 02/25/2022 Admitting Physician [name], MD [id number], Randa Evens, PA-C  Brief Summary: Patient is a 31 y.o.  male with history of recurrent MSSA native tricuspid valve endocarditis in spite of completing several courses of IV antibiotics.  Evaluated by ID/CT surgery-underwent tricuspid valve debridement/repair/annuloplasty on 9/19.  Significant events: 4/12-5/10>> hospitalization for MSSA bacteremia with native TV endocarditis-angio vac procedure on 4/26.Completed cefazolin on 5/10-planned weekly dalbavancin at the ID clinic.   5/22>> outpatient blood culture positive for MSSA 5/24-6/8>> hospitalization [facility] for persistent bacteremia-worsening back pain-discharge to [facility] on IV Ancef. 7/5>> discharged from [facility] on oral cefdinir.  Continued to have intermittent fever postdischarge.   7/24>> admit for-persistent fever/nausea/vomiting/diarrhea-blood cultures positive for MSSA. 9/5>> recurrent fever- blood cultures growing Staph aureus  9/5 >> PICC line discontinued 9/19>> debridement of tricuspid valve/valve reconstruction/annuloplasty 9/27>> transfer to [facility]  Significant studies: 7/25>> CT chest: Multifocal airspace opacities-increasing cavitary nodules 7/25>> MRI thoracic spine: No evidence of infection in thoracic spine.  Significant microbiology data: 7/24>> urine culture: Negative 7/25>> blood culture: 1/2-MSSA. 7/27>> blood culture: No growth 7/29>> blood culture: No growth 9/04>> blood cultures:MRSA 9/07>> blood cultures:neg 9/19>> tricuspid valve tissue culture: Staph aureus-sensitivities pending  Procedures: 8/3>> TEE: 3 vegetations on tricuspid valve-severe TR.  EF 60-65% 9/14>> multiple teeth extraction 9/19>> debridement of tricuspid valve/valve reconstruction/annuloplasty  Consults: ID CT surgery Dentist    Subjective:   Patient in bed, appears comfortable, denies any headache, no fever, no chest pain or pressure, no shortness of breath , no abdominal pain. No new focal weakness.  Objective: Vitals: Blood pressure 103/62, pulse 67, temperature 98.7 F (37.1 C), temperature source Oral, resp. rate 15, height 5\' 10"  (1.778 m), weight 86.6 kg, SpO2 97 %.   Exam:  Awake Alert, No new F.N deficits, Normal affect [place].AT,PERRAL Supple Neck, No JVD,   Symmetrical Chest wall movement, Good air movement bilaterally, CTAB RRR,No Gallops, Rubs or new Murmurs,  +ve B.Sounds, Abd Soft, No tenderness,   No Cyanosis, Clubbing or edema    Assessment/Plan:  Sepsis due to recurrent MSSA bacteremia with native tricuspid valve endocarditis-s/p valve repair/annuloplasty on 04/13/22 -  Sepsis physiology resolved-discussed with infectious disease MD on 9/28 Dr. Juleen China - needs 6 weeks of Teflaro/daptomycin stop date 06/04/22 - this will unfortunately have to be done as an inpatient.  Goal INR 2-3-pharmacy now managing Coumadin.  Per CT surgery note-needs Coumadin for 3 months from valve repair.  Normocytic anemia:  Due to chronic disease/recurrent endocarditis: No indication for transfusion.  Anxiety -  Stable-on Lexapro  Methamphetamine use  In remission  Chronic methadone use - Requesting that we slowly taper down his methadone dosage-decreased to 130 mg today-we will continue to taper down over the next few days.  Chronic HCV   - outpatient follow-up with ID.  Mild transaminitis resolved  Nutrition Status: Nutrition Problem: Inadequate oral intake Etiology: vomiting, nausea Signs/Symptoms: per patient/family report Interventions: Ensure Enlive (each supplement provides 350kcal and 20 grams of protein), MVI, Snacks  BMI: Estimated body mass index is 27.39 kg/m as calculated from the following:   Height as of this encounter: 5\' 10"  (1.778 m).   Weight as of this encounter: 86.6 kg.   Code  status:   Code Status:  Full Code   DVT Prophylaxis: SCDs Start: 04/24/22 0836 SCDs Start: 04/23/22 1259 warfarin (COUMADIN) tablet 5 mg    Family Communication: None at bedside  Disposition Plan: Status is: Inpatient for 6 weeks of IV antibiotics stop date 06/04/22.   Diet: Diet Order             DIET DYS 3 Room service appropriate? Yes; Fluid consistency: Thin  Diet effective now                    MEDICATIONS: Scheduled Meds:  Ferrous Fumarate  1 tablet Oral BID WC   And   vitamin B-12  100 mcg Oral BID WC   And   ascorbic acid  500 mg Oral BID WC   And   folic acid  1 mg Oral BID WC   aspirin EC  81 mg Oral Daily   bisacodyl  10 mg Oral Daily   Or   bisacodyl  10 mg Rectal Daily   Chlorhexidine Gluconate Cloth  6 each Topical Daily   docusate sodium  100 mg Oral BID   escitalopram  10 mg Oral Daily   feeding supplement  237 mL Oral BID BM   gabapentin  300 mg Oral TID   methadone  117.5 mg Oral Daily   Followed by   Melene Muller ON 05/14/2022] methadone  112.5 mg Oral Daily   Followed by   Melene Muller ON 05/17/2022] methadone  105 mg Oral Daily   Followed by   Melene Muller ON 05/20/2022] methadone  100 mg Oral Daily   Followed by   Melene Muller ON 05/23/2022] methadone  95 mg Oral Daily   Followed by   Melene Muller ON 05/26/2022] methadone  87.5 mg Oral Daily   Followed by   Melene Muller ON 05/29/2022] methadone  82.5 mg Oral Daily   Followed by   Melene Muller ON 06/01/2022] methadone  75 mg Oral Daily   multivitamin with minerals  1 tablet Oral Daily   nicotine  14 mg Transdermal Daily   pantoprazole  40 mg Oral Daily   sodium chloride flush  10-40 mL Intracatheter Q12H   warfarin  5 mg Oral ONCE-1600   Warfarin - Pharmacist Dosing Inpatient   Does not apply q1600   Continuous Infusions:  ceFTAROline (TEFLARO) IV 600 mg (05/12/22 5462)   DAPTOmycin (CUBICIN) 800 mg in sodium chloride 0.9 % IVPB 800 mg (05/11/22 2202)   PRN Meds:.acetaminophen, alum & mag hydroxide-simeth, bisacodyl,  diclofenac Sodium, docusate sodium, hydrOXYzine, loperamide, methocarbamol, mouth rinse, oxyCODONE, polyethylene glycol, promethazine, sodium chloride flush, sodium chloride flush   I have personally reviewed following labs and imaging studies  LABORATORY DATA:  Recent Labs  Lab 05/06/22 0339 05/11/22 0347  WBC 5.0 4.6  HGB 8.6* 8.5*  HCT 27.3* 27.8*  PLT 201 275  MCV 88.3 88.0  MCH 27.8 26.9  MCHC 31.5 30.6  RDW 15.2 14.8  LYMPHSABS  --  1.4  MONOABS  --  0.7  EOSABS  --  0.2  BASOSABS  --  0.0    Recent Labs  Lab 05/06/22 0339 05/07/22 0310 05/08/22 1505 05/09/22 0401 05/10/22 0309 05/11/22 0347 05/12/22 0420  NA 139  --   --   --   --  137  --   K 3.9  --   --   --   --  4.2  --   CL 102  --   --   --   --  104  --  CO2 28  --   --   --   --  28  --   GLUCOSE 101*  --   --   --   --  96  --   BUN 13  --   --   --   --  16  --   CREATININE 0.84  --   --   --   --  0.91  --   CALCIUM 9.2  --   --   --   --  8.7*  --   AST  --   --   --   --   --  25  --   ALT  --   --   --   --   --  27  --   ALKPHOS  --   --   --   --   --  87  --   BILITOT  --   --   --   --   --  0.4  --   ALBUMIN  --   --   --   --   --  2.7*  --   MG  --   --   --   --   --  1.9  --   PHOS  --   --   --   --   --  4.1  --   INR 3.0*   < > 2.2* 2.5* 2.5* 2.5* 2.0*   < > = values in this interval not displayed.     LOS: 76 days   Signature  Susa Raring M.D on 05/12/2022 at 10:32 AM   -  To page go to [email or web address]

## 2022-05-12 NOTE — Progress Notes (Signed)
[place] for Warfarin Indication:  tricuspid valve repair  No Known Allergies  Patient Measurements: Height: 5\' 10"  (177.8 cm) Weight: 86.6 kg (190 lb 14.7 oz) IBW/kg (Calculated) : 73  Vital Signs: Temp: 98.7 F (37.1 C) (10/08 0426) Temp Source: Oral (10/08 0426) BP: 103/62 (10/08 0426) Pulse Rate: 67 (10/08 0426)  Labs: Recent Labs    05/10/22 0309 05/11/22 0347 05/12/22 0420  HGB  --  8.5*  --   HCT  --  27.8*  --   PLT  --  275  --   LABPROT 26.8* 26.6* 22.3*  INR 2.5* 2.5* 2.0*  CREATININE  --  0.91  --      Estimated Creatinine Clearance: 121.4 mL/min (by C-G formula based on SCr of 0.91 mg/dL).  Assessment: Reginald Clarke is a 31 yr old male s/p tricuspid valve repair on 04/23/22. Warfarin initiated post-op day 1 along with Lovenox 40mg  SQ q24h. Aspirin reduced to 81mg  daily and Lovenox discontinued once INR at goal, naproxen discontinued, Trinsicon BID continued (using components due to backorder). Plan to treat w/ warfarin x 3 months per CT surgery.    Patient continues on ceftaroline and daptomycin through 10/31 (broad spectrum antibiotics may lead to increased INR). CBC remains stable, no issues with s/sx bleeding noted. INR remains therapeutic with moderate fluctuations, now 2.0.   Goal of Therapy:  INR 2-3 Monitor platelets by anticoagulation protocol: Yes   Plan:  Give warfarin 5 mg PO x1 dose Check INR daily while on warfarin Continue to monitor H&H and platelets   Thank you for allowing pharmacy to be a part of this patient's care.  Jeneen Rinks, Pharm.D PGY1 Pharmacy Resident 05/12/2022 8:29 AM

## 2022-05-13 DIAGNOSIS — I079 Rheumatic tricuspid valve disease, unspecified: Secondary | ICD-10-CM | POA: Diagnosis not present

## 2022-05-13 LAB — PROTIME-INR
INR: 2.3 — ABNORMAL HIGH (ref 0.8–1.2)
Prothrombin Time: 24.8 seconds — ABNORMAL HIGH (ref 11.4–15.2)

## 2022-05-13 MED ORDER — WARFARIN SODIUM 2.5 MG PO TABS
2.5000 mg | ORAL_TABLET | Freq: Once | ORAL | Status: AC
  Administered 2022-05-13: 2.5 mg via ORAL
  Filled 2022-05-13: qty 1

## 2022-05-13 NOTE — Progress Notes (Signed)
PROGRESS NOTE        PATIENT DETAILS Name: Reginald Clarke Age: 31 y.o. Sex: male Date of Birth: 1991/07/07 Admit Date: 02/25/2022 Admitting Physician Dorcas Carrow, MD [id number], [name], PA-C  Brief Summary: Patient is a 31 y.o.  male with history of recurrent MSSA native tricuspid valve endocarditis in spite of completing several courses of IV antibiotics.  Evaluated by ID/CT surgery-underwent tricuspid valve debridement/repair/annuloplasty on 9/19.  Significant events: 4/12-5/10>> hospitalization for MSSA bacteremia with native TV endocarditis-angio vac procedure on 4/26.Completed cefazolin on 5/10-planned weekly dalbavancin at the ID clinic.   5/22>> outpatient blood culture positive for MSSA 5/24-6/8>> hospitalization [facility] for persistent bacteremia-worsening back pain-discharge to [facility] on IV Ancef. 7/5>> discharged from [facility] on oral cefdinir.  Continued to have intermittent fever postdischarge.   7/24>> admit for-persistent fever/nausea/vomiting/diarrhea-blood cultures positive for MSSA. 9/5>> recurrent fever- blood cultures growing Staph aureus  9/5 >> PICC line discontinued 9/19>> debridement of tricuspid valve/valve reconstruction/annuloplasty 9/27>> transfer to [facility]  Significant studies: 7/25>> CT chest: Multifocal airspace opacities-increasing cavitary nodules 7/25>> MRI thoracic spine: No evidence of infection in thoracic spine.  Significant microbiology data: 7/24>> urine culture: Negative 7/25>> blood culture: 1/2-MSSA. 7/27>> blood culture: No growth 7/29>> blood culture: No growth 9/04>> blood cultures:MRSA 9/07>> blood cultures:neg 9/19>> tricuspid valve tissue culture: Staph aureus-sensitivities pending  Procedures: 8/3>> TEE: 3 vegetations on tricuspid valve-severe TR.  EF 60-65% 9/14>> multiple teeth extraction 9/19>> debridement of tricuspid valve/valve reconstruction/annuloplasty  Consults: ID CT surgery Dentist    Subjective:   Patient in bed, appears comfortable, denies any headache, no fever, no chest pain or pressure, no shortness of breath , no abdominal pain. No new focal weakness.   Objective: Vitals: Blood pressure (!) 91/56, pulse 69, temperature 98.4 F (36.9 C), temperature source Oral, resp. rate 15, height 5\' 10"  (1.778 m), weight 87.2 kg, SpO2 98 %.   Exam:  Awake Alert, No new F.N deficits, Normal affect [name].AT,PERRAL Supple Neck, No JVD,   Symmetrical Chest wall movement, Good air movement bilaterally, CTAB RRR,No Gallops, Rubs or new Murmurs,  +ve B.Sounds, Abd Soft, No tenderness,   No Cyanosis, Clubbing or edema     Assessment/Plan:  Sepsis due to recurrent MSSA bacteremia with native tricuspid valve endocarditis-s/p valve repair/annuloplasty on 04/13/22 -  Sepsis physiology resolved-discussed with infectious disease MD on 9/28 Dr. 10/28 - needs 6 weeks of Teflaro/daptomycin stop date 06/04/22 - this will unfortunately have to be done as an inpatient.  Goal INR 2-3-pharmacy now managing Coumadin.  Per CT surgery note-needs Coumadin for 3 months from valve repair.  Normocytic anemia:  Due to chronic disease/recurrent endocarditis: No indication for transfusion.  Anxiety -  Stable-on Lexapro  Methamphetamine use  In remission  Chronic methadone use - Requesting that we slowly taper down his methadone dosage-decreased to 130 mg today-we will continue to taper down over the next few days.  Chronic HCV   - outpatient follow-up with ID.  Mild transaminitis resolved  Nutrition Status: Nutrition Problem: Inadequate oral intake Etiology: vomiting, nausea Signs/Symptoms: per patient/family report Interventions: Ensure Enlive (each supplement provides 350kcal and 20 grams of protein), MVI, Snacks  BMI: Estimated body mass index is 27.58 kg/m as calculated from the following:   Height as of this encounter: 5\' 10"  (1.778 m).   Weight as of this encounter: 87.2 kg.   Code  status:  Code Status: Full Code   DVT Prophylaxis: SCDs Start: 04/24/22 0836 SCDs Start: 04/23/22 1259 warfarin (COUMADIN) tablet 2.5 mg    Family Communication: None at bedside  Disposition Plan: Status is: Inpatient for 6 weeks of IV antibiotics stop date 06/04/22.   Diet: Diet Order             DIET DYS 3 Room service appropriate? Yes; Fluid consistency: Thin  Diet effective now                    MEDICATIONS: Scheduled Meds:  Ferrous Fumarate  1 tablet Oral BID WC   And   vitamin B-12  100 mcg Oral BID WC   And   ascorbic acid  500 mg Oral BID WC   And   folic acid  1 mg Oral BID WC   aspirin EC  81 mg Oral Daily   bisacodyl  10 mg Oral Daily   Or   bisacodyl  10 mg Rectal Daily   Chlorhexidine Gluconate Cloth  6 each Topical Daily   docusate sodium  100 mg Oral BID   escitalopram  10 mg Oral Daily   feeding supplement  237 mL Oral BID BM   gabapentin  300 mg Oral TID   [START ON 05/14/2022] methadone  112.5 mg Oral Daily   Followed by   Derrill Memo ON 05/17/2022] methadone  105 mg Oral Daily   Followed by   Derrill Memo ON 05/20/2022] methadone  100 mg Oral Daily   Followed by   Derrill Memo ON 05/23/2022] methadone  95 mg Oral Daily   Followed by   Derrill Memo ON 05/26/2022] methadone  87.5 mg Oral Daily   Followed by   Derrill Memo ON 05/29/2022] methadone  82.5 mg Oral Daily   Followed by   Derrill Memo ON 06/01/2022] methadone  75 mg Oral Daily   multivitamin with minerals  1 tablet Oral Daily   nicotine  14 mg Transdermal Daily   pantoprazole  40 mg Oral Daily   sodium chloride flush  10-40 mL Intracatheter Q12H   warfarin  2.5 mg Oral ONCE-1600   Warfarin - Pharmacist Dosing Inpatient   Does not apply q1600   Continuous Infusions:  ceFTAROline (TEFLARO) IV 600 mg (05/13/22 0609)   DAPTOmycin (CUBICIN) 800 mg in sodium chloride 0.9 % IVPB 800 mg (05/12/22 2013)   PRN Meds:.acetaminophen, alum & mag hydroxide-simeth, bisacodyl, diclofenac Sodium, docusate sodium,  hydrOXYzine, loperamide, methocarbamol, mouth rinse, oxyCODONE, polyethylene glycol, promethazine, sodium chloride flush, sodium chloride flush   I have personally reviewed following labs and imaging studies  LABORATORY DATA:  Recent Labs  Lab 05/11/22 0347  WBC 4.6  HGB 8.5*  HCT 27.8*  PLT 275  MCV 88.0  MCH 26.9  MCHC 30.6  RDW 14.8  LYMPHSABS 1.4  MONOABS 0.7  EOSABS 0.2  BASOSABS 0.0    Recent Labs  Lab 05/09/22 0401 05/10/22 0309 05/11/22 0347 05/12/22 0420 05/13/22 0315  NA  --   --  137  --   --   K  --   --  4.2  --   --   CL  --   --  104  --   --   CO2  --   --  28  --   --   GLUCOSE  --   --  96  --   --   BUN  --   --  16  --   --   CREATININE  --   --  0.91  --   --   CALCIUM  --   --  8.7*  --   --   AST  --   --  25  --   --   ALT  --   --  27  --   --   ALKPHOS  --   --  87  --   --   BILITOT  --   --  0.4  --   --   ALBUMIN  --   --  2.7*  --   --   MG  --   --  1.9  --   --   PHOS  --   --  4.1  --   --   INR 2.5* 2.5* 2.5* 2.0* 2.3*     LOS: 77 days   Signature  Susa Raring M.D on 05/13/2022 at 11:03 AM   -  To page go to [email or web address]

## 2022-05-13 NOTE — Progress Notes (Signed)
[facility]    Date of Admission:  02/25/2022   3 wk post valve repair/TV ring placement   ID: Reginald Clarke is a 31 y.o. male with hx of MRSA/MSSA bacteremia and endocarditis requiring valve repair. Now on daptomycin and ceftaroline for MRSA/DRSA/VISA isolate Principal Problem:   Endocarditis of tricuspid valve Active Problems:   MRSA bacteremia   Chronic pain syndrome/methadone abuse    Tobacco abuse   Substance use disorder   Hepatitis C, chronic ([name])   Anxiety   Normocytic anemia   Prolonged QT interval   Sepsis ([name])   Elevated serum creatinine   Hypokalemia   Hypomagnesemia   Hyponatremia   Acute bacterial endocarditis   MSSA bacteremia   IVDU (intravenous drug user)   Pneumonia of both lower lobes due to methicillin resistant Staphylococcus aureus (MRSA) ([name])   Thrombocytopenia ([name])   Encounter for preoperative dental examination   Caries   Retained tooth root   Chronic apical periodontitis   Chronic periodontitis   Accretions on teeth   Impacted third molar tooth   Teeth missing   Phobia of dental procedure   Shock ([name])   Acute respiratory failure ([name])   Pleural effusion    Subjective: He states he has been ambulating in the hallway. No difficulty with picc line or abtx  Medications:   Ferrous Fumarate  1 tablet Oral BID WC   And   vitamin B-12  100 mcg Oral BID WC   And   ascorbic acid  500 mg Oral BID WC   And   folic acid  1 mg Oral BID WC   aspirin EC  81 mg Oral Daily   bisacodyl  10 mg Oral Daily   Or   bisacodyl  10 mg Rectal Daily   Chlorhexidine Gluconate Cloth  6 each Topical Daily   docusate sodium  100 mg Oral BID   escitalopram  10 mg Oral Daily   feeding supplement  237 mL Oral BID BM   gabapentin  300 mg Oral TID   [START ON 05/14/2022] methadone  112.5 mg Oral Daily   Followed by   Melene Muller ON 05/17/2022] methadone  105 mg Oral Daily   Followed by   Melene Muller ON 05/20/2022] methadone  100 mg Oral  Daily   Followed by   Melene Muller ON 05/23/2022] methadone  95 mg Oral Daily   Followed by   Melene Muller ON 05/26/2022] methadone  87.5 mg Oral Daily   Followed by   Melene Muller ON 05/29/2022] methadone  82.5 mg Oral Daily   Followed by   Melene Muller ON 06/01/2022] methadone  75 mg Oral Daily   multivitamin with minerals  1 tablet Oral Daily   nicotine  14 mg Transdermal Daily   pantoprazole  40 mg Oral Daily   sodium chloride flush  10-40 mL Intracatheter Q12H   warfarin  2.5 mg Oral ONCE-1600   Warfarin - Pharmacist Dosing Inpatient   Does not apply q1600    Objective: Vital signs in last 24 hours: Temp:  [97.8 F (36.6 C)-98.4 F (36.9 C)] 98.4 F (36.9 C) (10/09 0939) Pulse Rate:  [69-72] 69 (10/09 0515) Resp:  [15-16] 15 (10/09 0939) BP: (86-105)/(51-56) 91/56 (10/09 0939) SpO2:  [98 %] 98 % (10/09 0515) Weight:  [87.2 kg] 87.2 kg (10/09 0500)  Physical Exam  Constitutional: He is oriented to person, place, and time. He appears well-developed and well-nourished. No distress.  HENT:  Mouth/Throat: Oropharynx is clear  and moist. No oropharyngeal exudate.  Cardiovascular: Normal rate, regular rhythm and normal heart sounds. Exam reveals no gallop and no friction rub.  No murmur heard.  Chest wall = incision site is c/d/I, well healed Pulmonary/Chest: Effort normal and breath sounds normal. No respiratory distress. He has no wheezes.  Abdominal: Soft. Bowel sounds are normal. He exhibits no distension. There is no tenderness.  Lymphadenopathy:  He has no cervical adenopathy.  Neurological: He is alert and oriented to person, place, and time.  Skin: Skin is warm and dry. No rash noted. No erythema. Picc line is c/d/I and non tender Psychiatric: He has a normal mood and affect. His behavior is normal.    Lab Results Recent Labs    05/11/22 0347  WBC 4.6  HGB 8.5*  HCT 27.8*  NA 137  K 4.2  CL 104  CO2 28  BUN 16  CREATININE 0.91   Liver Panel Recent Labs    05/11/22 0347   PROT 6.3*  ALBUMIN 2.7*  AST 25  ALT 27  ALKPHOS 87  BILITOT 0.4   Sedimentation Rate No results for input(s): "ESRSEDRATE" in the last 72 hours. C-Reactive Protein No results for input(s): "CRP" in the last 72 hours.  Microbiology: reviewed Studies/Results: No results found.   Assessment/Plan: TV endocarditis with DRSA/MRSA/VISA - plan to continue with ceftaroline plus daptomycin for 3 more weeks to finish out course of treatment. End date of 06/04/22 Will continue to follow weekly CK values.  [facility] Pager: [phone]  05/13/2022, 2:37 PM

## 2022-05-13 NOTE — Progress Notes (Signed)
[name] [facility] Indication:  tricuspid valve repair  No Known Allergies  Patient Measurements: Height: 5\' 10"  (177.8 cm) Weight: 87.2 kg (192 lb 3.9 oz) IBW/kg (Calculated) : 73  Vital Signs: Temp: 97.8 F (36.6 C) (10/09 0515) Temp Source: Oral (10/09 0515) BP: 86/51 (10/09 0515) Pulse Rate: 69 (10/09 0515)  Labs: Recent Labs    05/11/22 0347 05/12/22 0420 05/13/22 0315  HGB 8.5*  --   --   HCT 27.8*  --   --   PLT 275  --   --   LABPROT 26.6* 22.3* 24.8*  INR 2.5* 2.0* 2.3*  CREATININE 0.91  --   --      Estimated Creatinine Clearance: 121.4 mL/min (by C-G formula based on SCr of 0.91 mg/dL).  Assessment: Reginald Clarke is a 31 yr old male s/p tricuspid valve repair on 04/23/22. Warfarin initiated post-op day 1 along with Lovenox 40mg  SQ q24h. Aspirin reduced to 81mg  daily and Lovenox discontinued once INR at goal, naproxen discontinued, Trinsicon BID continued (using components due to backorder). Plan to treat w/ warfarin x 3 months per CT surgery.    Patient continues on ceftaroline and daptomycin through 10/31 (broad spectrum antibiotics may lead to increased INR). CBC remains stable. No s/sx bleeding noted. INR remains therapeutic with moderate fluctuations, now 2.3.   Goal of Therapy:  INR 2-3 Monitor platelets by anticoagulation protocol: Yes   Plan:  Give warfarin 2.5 mg PO x1 dose Check INR daily while on warfarin Continue to monitor H&H and platelets   Thank you for allowing pharmacy to be a part of this patient's care.  Nicole Cella, RPh Clinical Pharmacist  05/13/2022 9:08 AM

## 2022-05-14 DIAGNOSIS — I079 Rheumatic tricuspid valve disease, unspecified: Secondary | ICD-10-CM | POA: Diagnosis not present

## 2022-05-14 LAB — PROTIME-INR
INR: 2.6 — ABNORMAL HIGH (ref 0.8–1.2)
Prothrombin Time: 27.8 seconds — ABNORMAL HIGH (ref 11.4–15.2)

## 2022-05-14 LAB — CK: Total CK: 16 U/L — ABNORMAL LOW (ref 49–397)

## 2022-05-14 MED ORDER — WARFARIN SODIUM 2.5 MG PO TABS
2.5000 mg | ORAL_TABLET | Freq: Once | ORAL | Status: AC
  Administered 2022-05-14: 2.5 mg via ORAL
  Filled 2022-05-14: qty 1

## 2022-05-14 NOTE — Progress Notes (Signed)
[place] [facility] Indication:  tricuspid valve repair  No Known Allergies  Patient Measurements: Height: 5\' 10"  (177.8 cm) Weight: 87.2 kg (192 lb 3.9 oz) IBW/kg (Calculated) : 73  Vital Signs:    Labs: Recent Labs    05/12/22 0420 05/13/22 0315 05/14/22 0325  LABPROT 22.3* 24.8* 27.8*  INR 2.0* 2.3* 2.6*  CKTOTAL  --   --  16*     Estimated Creatinine Clearance: 121.4 mL/min (by C-G formula based on SCr of 0.91 mg/dL).  Assessment: Reginald Clarke is a 31 yr old male s/p tricuspid valve repair on 04/23/22. Warfarin initiated post-op day 1 along with Lovenox 40mg  SQ q24h. Aspirin reduced to 81mg  daily and Lovenox discontinued once INR at goal, naproxen discontinued, Trinsicon BID continued (using components due to backorder). Plan to treat w/ warfarin x 3 months per CT surgery.    Patient continues on ceftaroline and daptomycin through 10/31 (broad spectrum antibiotics may lead to increased INR).  MD noteds normocytic anemia due to chronic disease/recurrent endocarditis. CBC remains stable. No s/sx bleeding noted.  INR remains therapeutic with moderate fluctuations, now 2.6.  Expect will need some type of alternating dosing regimen of 5mg  and 2.5mg .     Goal of Therapy:  INR 2-3 Monitor platelets by anticoagulation protocol: Yes   Plan:  Give warfarin 2.5 mg PO x1 dose Check INR daily while on warfarin Continue to monitor H&H and platelets   Thank you for allowing pharmacy to be a part of this patient's care.  Nicole Cella, RPh Clinical Pharmacist [phone] 05/14/2022 1:29 PM  Please check AMION for all [place] phone numbers After 10:00 PM, call [place] [phone]

## 2022-05-14 NOTE — Progress Notes (Signed)
PROGRESS NOTE        PATIENT DETAILS Name: Reginald Clarke Age: 31 y.o. Sex: male Date of Birth: 1991/07/23 Admit Date: 02/25/2022 Admitting Physician Dorcas Carrow, MD [id number], [name], PA-C  Brief Summary: Patient is a 31 y.o.  male with history of recurrent MSSA native tricuspid valve endocarditis in spite of completing several courses of IV antibiotics.  Evaluated by ID/CT surgery-underwent tricuspid valve debridement/repair/annuloplasty on 9/19.  Significant events: 4/12-5/10>> hospitalization for MSSA bacteremia with native TV endocarditis-angio vac procedure on 4/26.Completed cefazolin on 5/10-planned weekly dalbavancin at the ID clinic.   5/22>> outpatient blood culture positive for MSSA 5/24-6/8>> hospitalization [facility] for persistent bacteremia-worsening back pain-discharge to [facility] on IV Ancef. 7/5>> discharged from [facility] on oral cefdinir.  Continued to have intermittent fever postdischarge.   7/24>> admit for-persistent fever/nausea/vomiting/diarrhea-blood cultures positive for MSSA. 9/5>> recurrent fever- blood cultures growing Staph aureus  9/5 >> PICC line discontinued 9/19>> debridement of tricuspid valve/valve reconstruction/annuloplasty 9/27>> transfer to [facility]  Significant studies: 7/25>> CT chest: Multifocal airspace opacities-increasing cavitary nodules 7/25>> MRI thoracic spine: No evidence of infection in thoracic spine.  Significant microbiology data: 7/24>> urine culture: Negative 7/25>> blood culture: 1/2-MSSA. 7/27>> blood culture: No growth 7/29>> blood culture: No growth 9/04>> blood cultures:MRSA 9/07>> blood cultures:neg 9/19>> tricuspid valve tissue culture: Staph aureus-sensitivities pending  Procedures: 8/3>> TEE: 3 vegetations on tricuspid valve-severe TR.  EF 60-65% 9/14>> multiple teeth extraction 9/19>> debridement of tricuspid valve/valve reconstruction/annuloplasty  Consults: ID CT surgery Dentist    Subjective:   Patient in bed, appears comfortable, denies any headache, no fever, no chest pain or pressure, no shortness of breath , no abdominal pain. No new focal weakness.   Objective: Vitals: Blood pressure (!) 91/56, pulse 69, temperature 98.4 F (36.9 C), temperature source Oral, resp. rate 15, height 5\' 10"  (1.778 m), weight 87.2 kg, SpO2 98 %.   Exam:  Awake Alert, No new F.N deficits, Normal affect [place].AT,PERRAL Supple Neck, No JVD,   Symmetrical Chest wall movement, Good air movement bilaterally, CTAB RRR,No Gallops, Rubs or new Murmurs,  +ve B.Sounds, Abd Soft, No tenderness,   No Cyanosis, Clubbing or edema    Assessment/Plan:  Sepsis due to recurrent MSSA bacteremia with native tricuspid valve endocarditis-s/p valve repair/annuloplasty on 04/13/22 -  Sepsis physiology resolved-discussed with infectious disease MD on 9/28 Dr. 10/28 - needs 6 weeks of Teflaro/daptomycin stop date 06/04/22 - this will unfortunately have to be done as an inpatient.  Goal INR 2-3-pharmacy now managing Coumadin.  Per CT surgery note-needs Coumadin for 3 months from valve repair.  Normocytic anemia:  Due to chronic disease/recurrent endocarditis: No indication for transfusion.  Anxiety -  Stable-on Lexapro  Methamphetamine use  In remission  Chronic methadone use - Requesting that we slowly taper down his methadone dosage-decreased to 130 mg today-we will continue to taper down over the next few days.  Chronic HCV   - outpatient follow-up with ID.  Mild transaminitis resolved  Nutrition Status: Nutrition Problem: Inadequate oral intake Etiology: vomiting, nausea Signs/Symptoms: per patient/family report Interventions: Ensure Enlive (each supplement provides 350kcal and 20 grams of protein), MVI, Snacks  BMI: Estimated body mass index is 27.58 kg/m as calculated from the following:   Height as of this encounter: 5\' 10"  (1.778 m).   Weight as of this encounter: 87.2 kg.   Code  status:  Code Status: Full Code   DVT Prophylaxis: SCDs Start: 04/24/22 0836 SCDs Start: 04/23/22 1259    Family Communication: None at bedside  Disposition Plan: Status is: Inpatient for 6 weeks of IV antibiotics stop date 06/04/22.   Diet: Diet Order             DIET DYS 3 Room service appropriate? Yes; Fluid consistency: Thin  Diet effective now                    MEDICATIONS: Scheduled Meds:  Ferrous Fumarate  1 tablet Oral BID WC   And   vitamin B-12  100 mcg Oral BID WC   And   ascorbic acid  500 mg Oral BID WC   And   folic acid  1 mg Oral BID WC   aspirin EC  81 mg Oral Daily   bisacodyl  10 mg Oral Daily   Or   bisacodyl  10 mg Rectal Daily   Chlorhexidine Gluconate Cloth  6 each Topical Daily   docusate sodium  100 mg Oral BID   escitalopram  10 mg Oral Daily   feeding supplement  237 mL Oral BID BM   gabapentin  300 mg Oral TID   methadone  112.5 mg Oral Daily   Followed by   Melene Muller ON 05/17/2022] methadone  105 mg Oral Daily   Followed by   Melene Muller ON 05/20/2022] methadone  100 mg Oral Daily   Followed by   Melene Muller ON 05/23/2022] methadone  95 mg Oral Daily   Followed by   Melene Muller ON 05/26/2022] methadone  87.5 mg Oral Daily   Followed by   Melene Muller ON 05/29/2022] methadone  82.5 mg Oral Daily   Followed by   Melene Muller ON 06/01/2022] methadone  75 mg Oral Daily   multivitamin with minerals  1 tablet Oral Daily   nicotine  14 mg Transdermal Daily   pantoprazole  40 mg Oral Daily   sodium chloride flush  10-40 mL Intracatheter Q12H   Warfarin - Pharmacist Dosing Inpatient   Does not apply q1600   Continuous Infusions:  ceFTAROline (TEFLARO) IV 600 mg (05/14/22 0702)   DAPTOmycin (CUBICIN) 800 mg in sodium chloride 0.9 % IVPB 800 mg (05/13/22 2101)   PRN Meds:.acetaminophen, alum & mag hydroxide-simeth, bisacodyl, diclofenac Sodium, docusate sodium, hydrOXYzine, loperamide, methocarbamol, mouth rinse, oxyCODONE, polyethylene glycol, promethazine,  sodium chloride flush, sodium chloride flush   I have personally reviewed following labs and imaging studies  LABORATORY DATA:  Recent Labs  Lab 05/11/22 0347  WBC 4.6  HGB 8.5*  HCT 27.8*  PLT 275  MCV 88.0  MCH 26.9  MCHC 30.6  RDW 14.8  LYMPHSABS 1.4  MONOABS 0.7  EOSABS 0.2  BASOSABS 0.0    Recent Labs  Lab 05/10/22 0309 05/11/22 0347 05/12/22 0420 05/13/22 0315 05/14/22 0325  NA  --  137  --   --   --   K  --  4.2  --   --   --   CL  --  104  --   --   --   CO2  --  28  --   --   --   GLUCOSE  --  96  --   --   --   BUN  --  16  --   --   --   CREATININE  --  0.91  --   --   --   CALCIUM  --  8.7*  --   --   --   AST  --  25  --   --   --   ALT  --  27  --   --   --   ALKPHOS  --  87  --   --   --   BILITOT  --  0.4  --   --   --   ALBUMIN  --  2.7*  --   --   --   MG  --  1.9  --   --   --   PHOS  --  4.1  --   --   --   INR 2.5* 2.5* 2.0* 2.3* 2.6*     LOS: 78 days   Signature  Lala Lund M.D on 05/14/2022 at 8:59 AM   -  To page go to [email or web address]

## 2022-05-15 DIAGNOSIS — I079 Rheumatic tricuspid valve disease, unspecified: Secondary | ICD-10-CM | POA: Diagnosis not present

## 2022-05-15 LAB — CBC WITH DIFFERENTIAL/PLATELET
Abs Immature Granulocytes: 0.01 10*3/uL (ref 0.00–0.07)
Basophils Absolute: 0 10*3/uL (ref 0.0–0.1)
Basophils Relative: 1 %
Eosinophils Absolute: 0.2 10*3/uL (ref 0.0–0.5)
Eosinophils Relative: 6 %
HCT: 28.9 % — ABNORMAL LOW (ref 39.0–52.0)
Hemoglobin: 8.8 g/dL — ABNORMAL LOW (ref 13.0–17.0)
Immature Granulocytes: 0 %
Lymphocytes Relative: 32 %
Lymphs Abs: 1.3 10*3/uL (ref 0.7–4.0)
MCH: 26.2 pg (ref 26.0–34.0)
MCHC: 30.4 g/dL (ref 30.0–36.0)
MCV: 86 fL (ref 80.0–100.0)
Monocytes Absolute: 0.6 10*3/uL (ref 0.1–1.0)
Monocytes Relative: 14 %
Neutro Abs: 1.9 10*3/uL (ref 1.7–7.7)
Neutrophils Relative %: 47 %
Platelets: 269 10*3/uL (ref 150–400)
RBC: 3.36 MIL/uL — ABNORMAL LOW (ref 4.22–5.81)
RDW: 14.6 % (ref 11.5–15.5)
WBC: 4 10*3/uL (ref 4.0–10.5)
nRBC: 0 % (ref 0.0–0.2)

## 2022-05-15 LAB — COMPREHENSIVE METABOLIC PANEL
ALT: 26 U/L (ref 0–44)
AST: 24 U/L (ref 15–41)
Albumin: 2.6 g/dL — ABNORMAL LOW (ref 3.5–5.0)
Alkaline Phosphatase: 77 U/L (ref 38–126)
Anion gap: 7 (ref 5–15)
BUN: 11 mg/dL (ref 6–20)
CO2: 27 mmol/L (ref 22–32)
Calcium: 8.8 mg/dL — ABNORMAL LOW (ref 8.9–10.3)
Chloride: 103 mmol/L (ref 98–111)
Creatinine, Ser: 0.77 mg/dL (ref 0.61–1.24)
GFR, Estimated: >60 mL/min (ref >60)
Glucose, Bld: 94 mg/dL (ref 70–99)
Potassium: 3.8 mmol/L (ref 3.5–5.1)
Sodium: 137 mmol/L (ref 135–145)
Total Bilirubin: 0.5 mg/dL (ref 0.3–1.2)
Total Protein: 6.2 g/dL — ABNORMAL LOW (ref 6.5–8.1)

## 2022-05-15 LAB — PROTIME-INR
INR: 2.3 — ABNORMAL HIGH (ref 0.8–1.2)
Prothrombin Time: 24.9 seconds — ABNORMAL HIGH (ref 11.4–15.2)

## 2022-05-15 LAB — MAGNESIUM: Magnesium: 1.9 mg/dL (ref 1.7–2.4)

## 2022-05-15 MED ORDER — WARFARIN SODIUM 4 MG PO TABS
4.0000 mg | ORAL_TABLET | Freq: Every day | ORAL | Status: DC
  Administered 2022-05-15 – 2022-05-20 (×6): 4 mg via ORAL
  Filled 2022-05-15 (×7): qty 1

## 2022-05-15 MED ORDER — ONDANSETRON HCL 4 MG/2ML IJ SOLN
4.0000 mg | Freq: Once | INTRAMUSCULAR | Status: AC
  Administered 2022-05-15: 4 mg via INTRAVENOUS
  Filled 2022-05-15: qty 2

## 2022-05-15 MED ORDER — ONDANSETRON HCL 4 MG/2ML IJ SOLN: 4.0000 mg | Freq: Three times a day (TID) | INTRAMUSCULAR | Status: DC | PRN

## 2022-05-15 MED ORDER — WARFARIN SODIUM 5 MG PO TABS: 5.0000 mg | ORAL_TABLET | Freq: Once | ORAL | Status: DC

## 2022-05-15 MED ORDER — MAGNESIUM SULFATE 2 GM/50ML IV SOLN
2.0000 g | Freq: Once | INTRAVENOUS | Status: AC
  Administered 2022-05-15: 2 g via INTRAVENOUS
  Filled 2022-05-15: qty 50

## 2022-05-15 MED ORDER — POTASSIUM CHLORIDE 10 MEQ/50ML IV SOLN
10.0000 meq | INTRAVENOUS | Status: AC
  Administered 2022-05-15 (×2): 10 meq via INTRAVENOUS
  Filled 2022-05-15 (×2): qty 50

## 2022-05-15 NOTE — Progress Notes (Signed)
PROGRESS NOTE        PATIENT DETAILS Name: Reginald Clarke Age: 31 y.o. Sex: male Date of Birth: 06/07/1991 Admit Date: 02/25/2022 Admitting Physician Dorcas Carrow, MD [id number], [name], PA-C  Brief Summary: Patient is a 31 y.o.  male with history of recurrent MSSA native tricuspid valve endocarditis in spite of completing several courses of IV antibiotics.  Evaluated by ID/CT surgery-underwent tricuspid valve debridement/repair/annuloplasty on 9/19.  Significant events: 4/12-5/10>> hospitalization for MSSA bacteremia with native TV endocarditis-angio vac procedure on 4/26.Completed cefazolin on 5/10-planned weekly dalbavancin at the ID clinic.   5/22>> outpatient blood culture positive for MSSA 5/24-6/8>> hospitalization [facility] for persistent bacteremia-worsening back pain-discharge to [facility] on IV Ancef. 7/5>> discharged from [facility] on oral cefdinir.  Continued to have intermittent fever postdischarge.   7/24>> admit for-persistent fever/nausea/vomiting/diarrhea-blood cultures positive for MSSA. 9/5>> recurrent fever- blood cultures growing Staph aureus  9/5 >> PICC line discontinued 9/19>> debridement of tricuspid valve/valve reconstruction/annuloplasty 9/27>> transfer to [facility]  Significant studies: 7/25>> CT chest: Multifocal airspace opacities-increasing cavitary nodules 7/25>> MRI thoracic spine: No evidence of infection in thoracic spine.  Significant microbiology data: 7/24>> urine culture: Negative 7/25>> blood culture: 1/2-MSSA. 7/27>> blood culture: No growth 7/29>> blood culture: No growth 9/04>> blood cultures:MRSA 9/07>> blood cultures:neg 9/19>> tricuspid valve tissue culture: Staph aureus-sensitivities pending  Procedures: 8/3>> TEE: 3 vegetations on tricuspid valve-severe TR.  EF 60-65% 9/14>> multiple teeth extraction 9/19>> debridement of tricuspid valve/valve reconstruction/annuloplasty  Consults: ID CT surgery Dentist    Subjective:   Patient in bed, appears comfortable, denies any headache, no fever, no chest pain or pressure, no shortness of breath , no abdominal pain. No focal weakness.   Objective: Vitals: Blood pressure (!) 95/54, pulse 62, temperature 98.2 F (36.8 C), temperature source Oral, resp. rate 12, height 5\' 10"  (1.778 m), weight 87.2 kg, SpO2 98 %.   Exam:  Awake Alert, No new F.N deficits, Normal affect [place].AT,PERRAL Supple Neck, No JVD,   Symmetrical Chest wall movement, Good air movement bilaterally, CTAB RRR,No Gallops, Rubs or new Murmurs,  +ve B.Sounds, Abd Soft, No tenderness,   No Cyanosis, Clubbing or edema    Assessment/Plan:  Sepsis due to recurrent MSSA bacteremia with native tricuspid valve endocarditis-s/p valve repair/annuloplasty on 04/13/22 -  Sepsis physiology resolved-discussed with infectious disease MD on 9/28 Dr. 10/28 - needs 6 weeks of Teflaro/daptomycin stop date 06/04/22 - this will unfortunately have to be done as an inpatient.  Goal INR 2-3-pharmacy now managing Coumadin.  Per CT surgery note-needs Coumadin for 3 months from valve repair.  Normocytic anemia:  Due to chronic disease/recurrent endocarditis: No indication for transfusion.  Anxiety -  Stable-on Lexapro  Methamphetamine use  In remission  Chronic methadone use - Requesting that we slowly taper down his methadone dosage-decreased to 130 mg today-we will continue to taper down over the next few days.  Chronic HCV   - outpatient follow-up with ID.  Mild transaminitis resolved  Nutrition Status: Nutrition Problem: Inadequate oral intake Etiology: vomiting, nausea Signs/Symptoms: per patient/family report Interventions: Ensure Enlive (each supplement provides 350kcal and 20 grams of protein), MVI, Snacks  BMI: Estimated body mass index is 27.58 kg/m as calculated from the following:   Height as of this encounter: 5\' 10"  (1.778 m).   Weight as of this encounter: 87.2 kg.   Code  status:   Code  Status: Full Code   DVT Prophylaxis: SCDs Start: 04/24/22 0836 SCDs Start: 04/23/22 1259    Family Communication: None at bedside  Disposition Plan: Status is: Inpatient for 6 weeks of IV antibiotics stop date 06/04/22.   Diet: Diet Order             DIET DYS 3 Room service appropriate? Yes; Fluid consistency: Thin  Diet effective now                    MEDICATIONS: Scheduled Meds:  Ferrous Fumarate  1 tablet Oral BID WC   And   vitamin B-12  100 mcg Oral BID WC   And   ascorbic acid  500 mg Oral BID WC   And   folic acid  1 mg Oral BID WC   aspirin EC  81 mg Oral Daily   bisacodyl  10 mg Oral Daily   Or   bisacodyl  10 mg Rectal Daily   Chlorhexidine Gluconate Cloth  6 each Topical Daily   docusate sodium  100 mg Oral BID   escitalopram  10 mg Oral Daily   feeding supplement  237 mL Oral BID BM   gabapentin  300 mg Oral TID   methadone  112.5 mg Oral Daily   Followed by   Melene Muller ON 05/17/2022] methadone  105 mg Oral Daily   Followed by   Melene Muller ON 05/20/2022] methadone  100 mg Oral Daily   Followed by   Melene Muller ON 05/23/2022] methadone  95 mg Oral Daily   Followed by   Melene Muller ON 05/26/2022] methadone  87.5 mg Oral Daily   Followed by   Melene Muller ON 05/29/2022] methadone  82.5 mg Oral Daily   Followed by   Melene Muller ON 06/01/2022] methadone  75 mg Oral Daily   multivitamin with minerals  1 tablet Oral Daily   nicotine  14 mg Transdermal Daily   pantoprazole  40 mg Oral Daily   sodium chloride flush  10-40 mL Intracatheter Q12H   Warfarin - Pharmacist Dosing Inpatient   Does not apply q1600   Continuous Infusions:  ceFTAROline (TEFLARO) IV 600 mg (05/15/22 0713)   DAPTOmycin (CUBICIN) 800 mg in sodium chloride 0.9 % IVPB 800 mg (05/14/22 2118)   PRN Meds:.acetaminophen, alum & mag hydroxide-simeth, bisacodyl, diclofenac Sodium, docusate sodium, hydrOXYzine, loperamide, methocarbamol, mouth rinse, oxyCODONE, polyethylene glycol, promethazine,  sodium chloride flush, sodium chloride flush   I have personally reviewed following labs and imaging studies  LABORATORY DATA:  Recent Labs  Lab 05/11/22 0347 05/15/22 0340  WBC 4.6 4.0  HGB 8.5* 8.8*  HCT 27.8* 28.9*  PLT 275 269  MCV 88.0 86.0  MCH 26.9 26.2  MCHC 30.6 30.4  RDW 14.8 14.6  LYMPHSABS 1.4 1.3  MONOABS 0.7 0.6  EOSABS 0.2 0.2  BASOSABS 0.0 0.0    Recent Labs  Lab 05/11/22 0347 05/12/22 0420 05/13/22 0315 05/14/22 0325 05/15/22 0340  NA 137  --   --   --  137  K 4.2  --   --   --  3.8  CL 104  --   --   --  103  CO2 28  --   --   --  27  GLUCOSE 96  --   --   --  94  BUN 16  --   --   --  11  CREATININE 0.91  --   --   --  0.77  CALCIUM 8.7*  --   --   --  8.8*  AST 25  --   --   --  24  ALT 27  --   --   --  26  ALKPHOS 87  --   --   --  77  BILITOT 0.4  --   --   --  0.5  ALBUMIN 2.7*  --   --   --  2.6*  MG 1.9  --   --   --  1.9  PHOS 4.1  --   --   --   --   INR 2.5* 2.0* 2.3* 2.6* 2.3*     LOS: 79 days   Signature  Lala Lund M.D on 05/15/2022 at 10:40 AM   -  To page go to [email or web address]

## 2022-05-15 NOTE — Progress Notes (Addendum)
[place] for Warfarin Indication:  tricuspid valve repair  No Known Allergies  Patient Measurements: Height: 5\' 10"  (177.8 cm) Weight: 87.2 kg (192 lb 3.9 oz) IBW/kg (Calculated) : 73  Vital Signs: Temp: 98.2 F (36.8 C) (10/11 0838) Temp Source: Oral (10/11 0838) BP: 95/54 (10/11 0838) Pulse Rate: 62 (10/11 0838)  Labs: Recent Labs    05/13/22 0315 05/14/22 0325 05/15/22 0340  HGB  --   --  8.8*  HCT  --   --  28.9*  PLT  --   --  269  LABPROT 24.8* 27.8* 24.9*  INR 2.3* 2.6* 2.3*  CREATININE  --   --  0.77  CKTOTAL  --  16*  --      Estimated Creatinine Clearance: 138.1 mL/min (by C-G formula based on SCr of 0.77 mg/dL).  Assessment: Reginald Clarke is a 31 yr old male s/p tricuspid valve repair on 04/23/22. Warfarin initiated post-op day 1 along with Lovenox 40mg  SQ q24h. Aspirin reduced to 81mg  daily and Lovenox discontinued once INR at goal, naproxen discontinued, Trinsicon BID continued (using components due to backorder). Plan to treat w/ warfarin x 3 months per CT surgery.    Patient continues on ceftaroline and daptomycin through 10/31 (broad spectrum antibiotics may lead to increased INR).  MD noteds normocytic anemia due to chronic disease/recurrent endocarditis. CBC remains stable. No s/sx bleeding noted.  INR remains therapeutic with moderate fluctuations, now 2.3.  Based on warfarin doses given in 7 days period, 4mg  warfarin daily should keep INR 2-3.    May need higher dose once off IV antibiotics.   Goal of Therapy:  INR 2-3 Monitor platelets by anticoagulation protocol: Yes   Plan:  Give warfarin  4 mg daily.  May need higher dose once off IV antibiotics. Check INR  every other day, next due Friday 10/13.  Continue to monitor H&H and platelets   Thank you for allowing pharmacy to be a part of this patient's care.  Nicole Cella, RPh Clinical Pharmacist [phone] 05/15/2022 11:17 AM  Please check AMION for all [place] phone numbers After 10:00 PM, call [place] [phone]

## 2022-05-15 NOTE — Progress Notes (Signed)
Verbal order from Dr. Candiss Norse for Zofran 4 mg IV q8 PRN

## 2022-05-16 MED ORDER — SODIUM CHLORIDE 0.9 % IV SOLN
25.0000 mg | Freq: Two times a day (BID) | INTRAVENOUS | Status: DC | PRN
  Administered 2022-05-18 – 2022-05-20 (×3): 25 mg via INTRAVENOUS
  Filled 2022-05-16 (×5): qty 1

## 2022-05-16 MED ORDER — DOCUSATE SODIUM 100 MG PO CAPS
200.0000 mg | ORAL_CAPSULE | Freq: Two times a day (BID) | ORAL | Status: DC
  Administered 2022-05-20 – 2022-06-03 (×4): 200 mg via ORAL
  Filled 2022-05-16 (×19): qty 2

## 2022-05-16 MED ORDER — BISACODYL 5 MG PO TBEC: 10.0000 mg | DELAYED_RELEASE_TABLET | Freq: Every day | ORAL | Status: DC | PRN

## 2022-05-17 DIAGNOSIS — I079 Rheumatic tricuspid valve disease, unspecified: Secondary | ICD-10-CM | POA: Diagnosis not present

## 2022-05-17 LAB — PROTIME-INR
INR: 2 — ABNORMAL HIGH (ref 0.8–1.2)
Prothrombin Time: 22.8 seconds — ABNORMAL HIGH (ref 11.4–15.2)

## 2022-05-17 NOTE — Progress Notes (Signed)
PROGRESS NOTE        PATIENT DETAILS Name: Reginald Clarke Age: 31 y.o. Sex: male Date of Birth: [birth year] Admit Date: 02/25/2022 Admitting Physician Dorcas Carrow, MD [id number], [name], PA-C  Brief Summary: Patient is a 31 y.o.  male with history of recurrent MSSA native tricuspid valve endocarditis in spite of completing several courses of IV antibiotics.  Evaluated by ID/CT surgery-underwent tricuspid valve debridement/repair/annuloplasty on 9/19.  Significant events: 4/12-5/10>> hospitalization for MSSA bacteremia with native TV endocarditis-angio vac procedure on 4/26.Completed cefazolin on 5/10-planned weekly dalbavancin at the ID clinic.   5/22>> outpatient blood culture positive for MSSA 5/24-6/8>> hospitalization [facility] for persistent bacteremia-worsening back pain-discharge to [facility] on IV Ancef. 7/5>> discharged from [facility] on oral cefdinir.  Continued to have intermittent fever postdischarge.   7/24>> admit for-persistent fever/nausea/vomiting/diarrhea-blood cultures positive for MSSA. 9/5>> recurrent fever- blood cultures growing Staph aureus  9/5 >> PICC line discontinued 9/19>> debridement of tricuspid valve/valve reconstruction/annuloplasty 9/27>> transfer to [facility]  Significant studies: 7/25>> CT chest: Multifocal airspace opacities-increasing cavitary nodules 7/25>> MRI thoracic spine: No evidence of infection in thoracic spine.  Significant microbiology data: 7/24>> urine culture: Negative 7/25>> blood culture: 1/2-MSSA. 7/27>> blood culture: No growth 7/29>> blood culture: No growth 9/04>> blood cultures:MRSA 9/07>> blood cultures:neg 9/19>> tricuspid valve tissue culture: Staph aureus-sensitivities pending  Procedures: 8/3>> TEE: 3 vegetations on tricuspid valve-severe TR.  EF 60-65% 9/14>> multiple teeth extraction 9/19>> debridement of tricuspid valve/valve reconstruction/annuloplasty  Consults: ID CT surgery Dentist    Subjective:   Patient in bed, appears comfortable, denies any headache, no fever, no chest pain or pressure, no shortness of breath , no abdominal pain. No new focal weakness.  Objective:  Vitals: Blood pressure 110/61, pulse 65, temperature 98.2 F (36.8 C), temperature source Oral, resp. rate 18, height 5\' 10"  (1.778 m), weight 87 kg, SpO2 100 %.   Exam:  Awake Alert, No new F.N deficits, Normal affect [place].AT,PERRAL Supple Neck, No JVD,   Symmetrical Chest wall movement, Good air movement bilaterally, CTAB RRR,No Gallops, Rubs or new Murmurs,  +ve B.Sounds, Abd Soft, No tenderness,   No Cyanosis, Clubbing or edema    Assessment/Plan:  Sepsis due to recurrent MSSA bacteremia with native tricuspid valve endocarditis-s/p valve repair/annuloplasty on 04/13/22 -  Sepsis physiology resolved-discussed with infectious disease MD on 9/28 Dr. 10/28 - needs 6 weeks of Teflaro/daptomycin stop date 06/04/22 - this will unfortunately have to be done as an inpatient.  Goal INR 2-3-pharmacy now managing Coumadin.  Per CT surgery note-needs Coumadin for 3 months from valve repair.  Normocytic anemia:  Due to chronic disease/recurrent endocarditis: No indication for transfusion.  Anxiety -  Stable-on Lexapro  Methamphetamine use  In remission  Chronic methadone use - Requesting that we slowly taper down his methadone dosage-decreased to 130 mg today-we will continue to taper down over the next few days.  Chronic HCV   - outpatient follow-up with ID.  Mild transaminitis resolved  Nutrition Status: Nutrition Problem: Inadequate oral intake Etiology: vomiting, nausea Signs/Symptoms: per patient/family report Interventions: Ensure Enlive (each supplement provides 350kcal and 20 grams of protein), MVI, Snacks  BMI: Estimated body mass index is 27.52 kg/m as calculated from the following:   Height as of this encounter: 5\' 10"  (1.778 m).   Weight as of this encounter: 87 kg.   Code  status:   Code  Status: Full Code   DVT Prophylaxis: SCDs Start: 04/24/22 0836 SCDs Start: 04/23/22 1259 warfarin (COUMADIN) tablet 4 mg    Family Communication: None at bedside  Disposition Plan: Status is: Inpatient for 6 weeks of IV antibiotics stop date 06/04/22.   Diet: Diet Order             DIET DYS 3 Room service appropriate? Yes; Fluid consistency: Thin  Diet effective now                    MEDICATIONS: Scheduled Meds:  Ferrous Fumarate  1 tablet Oral BID WC   And   vitamin B-12  100 mcg Oral BID WC   And   ascorbic acid  500 mg Oral BID WC   And   folic acid  1 mg Oral BID WC   aspirin EC  81 mg Oral Daily   bisacodyl  10 mg Oral Daily   Or   bisacodyl  10 mg Rectal Daily   Chlorhexidine Gluconate Cloth  6 each Topical Daily   docusate sodium  200 mg Oral BID   escitalopram  10 mg Oral Daily   feeding supplement  237 mL Oral BID BM   gabapentin  300 mg Oral TID   methadone  105 mg Oral Daily   Followed by   Derrill Memo ON 05/20/2022] methadone  100 mg Oral Daily   Followed by   Derrill Memo ON 05/23/2022] methadone  95 mg Oral Daily   Followed by   Derrill Memo ON 05/26/2022] methadone  87.5 mg Oral Daily   Followed by   Derrill Memo ON 05/29/2022] methadone  82.5 mg Oral Daily   Followed by   Derrill Memo ON 06/01/2022] methadone  75 mg Oral Daily   multivitamin with minerals  1 tablet Oral Daily   nicotine  14 mg Transdermal Daily   pantoprazole  40 mg Oral Daily   warfarin  4 mg Oral q1600   Warfarin - Pharmacist Dosing Inpatient   Does not apply q1600   Continuous Infusions:  ceFTAROline (TEFLARO) IV 600 mg (05/17/22 0907)   DAPTOmycin (CUBICIN) 800 mg in sodium chloride 0.9 % IVPB 800 mg (05/16/22 2108)   promethazine (PHENERGAN) injection (IM or IVPB)     PRN Meds:.acetaminophen, alum & mag hydroxide-simeth, bisacodyl, diclofenac Sodium, hydrOXYzine, loperamide, methocarbamol, mouth rinse, oxyCODONE, polyethylene glycol, promethazine (PHENERGAN) injection (IM or  IVPB), sodium chloride flush   I have personally reviewed following labs and imaging studies  LABORATORY DATA:  Recent Labs  Lab 05/11/22 0347 05/15/22 0340  WBC 4.6 4.0  HGB 8.5* 8.8*  HCT 27.8* 28.9*  PLT 275 269  MCV 88.0 86.0  MCH 26.9 26.2  MCHC 30.6 30.4  RDW 14.8 14.6  LYMPHSABS 1.4 1.3  MONOABS 0.7 0.6  EOSABS 0.2 0.2  BASOSABS 0.0 0.0    Recent Labs  Lab 05/11/22 0347 05/12/22 0420 05/13/22 0315 05/14/22 0325 05/15/22 0340 05/17/22 0507  NA 137  --   --   --  137  --   K 4.2  --   --   --  3.8  --   CL 104  --   --   --  103  --   CO2 28  --   --   --  27  --   GLUCOSE 96  --   --   --  94  --   BUN 16  --   --   --  11  --  CREATININE 0.91  --   --   --  0.77  --   CALCIUM 8.7*  --   --   --  8.8*  --   AST 25  --   --   --  24  --   ALT 27  --   --   --  26  --   ALKPHOS 87  --   --   --  77  --   BILITOT 0.4  --   --   --  0.5  --   ALBUMIN 2.7*  --   --   --  2.6*  --   MG 1.9  --   --   --  1.9  --   PHOS 4.1  --   --   --   --   --   INR 2.5* 2.0* 2.3* 2.6* 2.3* 2.0*     LOS: 81 days   Signature  Susa Raring M.D on 05/17/2022 at 10:39 AM   -  To page go to [email or web address]

## 2022-05-17 NOTE — Progress Notes (Signed)
[place] for Warfarin Indication:  tricuspid valve repair  No Known Allergies  Patient Measurements: Height: 5\' 10"  (177.8 cm) Weight: 87 kg (191 lb 12.8 oz) IBW/kg (Calculated) : 73  Vital Signs: Temp: 98.2 F (36.8 C) (10/13 0500) Temp Source: Oral (10/13 0500) BP: 110/61 (10/13 0500) Pulse Rate: 65 (10/13 0500)  Labs: Recent Labs    05/15/22 0340 05/17/22 0507  HGB 8.8*  --   HCT 28.9*  --   PLT 269  --   LABPROT 24.9* 22.8*  INR 2.3* 2.0*  CREATININE 0.77  --      Estimated Creatinine Clearance: 138.1 mL/min (by C-G formula based on SCr of 0.77 mg/dL).  Assessment: Reginald Clarke is a 31 yr old male s/p tricuspid valve repair on 04/23/22. Warfarin initiated post-op day with plan to treat w/ warfarin x 3 months per CT surgery.    Patient continues on ceftaroline and daptomycin through 10/31 (broad spectrum antibiotics may lead to increased INR).  May need higher dose once off IV antibiotics. -INR remains at goal -hg= 8.8   Goal of Therapy:  INR 2-3 Monitor platelets by anticoagulation protocol: Yes   Plan:  Give warfarin  4 mg daily.   Check INR  every other day, next due 10/15.  Continue to monitor H&H and platelets   Thank you for allowing pharmacy to be a part of this patient's care.  Hildred Laser, PharmD Clinical Pharmacist **Pharmacist phone directory can now be found on [email or web address] ([email or web address]).  Listed under [place].

## 2022-05-18 DIAGNOSIS — I079 Rheumatic tricuspid valve disease, unspecified: Secondary | ICD-10-CM | POA: Diagnosis not present

## 2022-05-18 NOTE — Plan of Care (Signed)
  Problem: Clinical Measurements: Goal: Diagnostic test results will improve Outcome: Progressing Goal: Respiratory complications will improve Outcome: Progressing Goal: Cardiovascular complication will be avoided Outcome: Progressing   Problem: Activity: Goal: Risk for activity intolerance will decrease Outcome: Progressing   

## 2022-05-18 NOTE — Progress Notes (Signed)
PROGRESS NOTE        PATIENT DETAILS Name: Reginald Clarke Age: 31 y.o. Sex: male Date of Birth: 1990/11/24 Admit Date: 02/25/2022 Admitting Physician Dorcas Carrow, MD [id number], [name], PA-C  Brief Summary: Patient is a 31 y.o.  male with history of recurrent MSSA native tricuspid valve endocarditis in spite of completing several courses of IV antibiotics.  Evaluated by ID/CT surgery-underwent tricuspid valve debridement/repair/annuloplasty on 9/19.  Significant events: 4/12-5/10>> hospitalization for MSSA bacteremia with native TV endocarditis-angio vac procedure on 4/26.Completed cefazolin on 5/10-planned weekly dalbavancin at the ID clinic.   5/22>> outpatient blood culture positive for MSSA 5/24-6/8>> hospitalization [facility] for persistent bacteremia-worsening back pain-discharge to [facility] on IV Ancef. 7/5>> discharged from [facility] on oral cefdinir.  Continued to have intermittent fever postdischarge.   7/24>> admit for-persistent fever/nausea/vomiting/diarrhea-blood cultures positive for MSSA. 9/5>> recurrent fever- blood cultures growing Staph aureus  9/5 >> PICC line discontinued 9/19>> debridement of tricuspid valve/valve reconstruction/annuloplasty 9/27>> transfer to [facility]  Significant studies: 7/25>> CT chest: Multifocal airspace opacities-increasing cavitary nodules 7/25>> MRI thoracic spine: No evidence of infection in thoracic spine.  Significant microbiology data: 7/24>> urine culture: Negative 7/25>> blood culture: 1/2-MSSA. 7/27>> blood culture: No growth 7/29>> blood culture: No growth 9/04>> blood cultures:MRSA 9/07>> blood cultures:neg 9/19>> tricuspid valve tissue culture: Staph aureus-sensitivities pending  Procedures: 8/3>> TEE: 3 vegetations on tricuspid valve-severe TR.  EF 60-65% 9/14>> multiple teeth extraction 9/19>> debridement of tricuspid valve/valve reconstruction/annuloplasty  Consults: ID CT surgery Dentist    Subjective:   Patient in bed, appears comfortable, denies any headache, no fever, no chest pain or pressure, no shortness of breath , no abdominal pain. No new focal weakness.  Objective:  Vitals: Blood pressure 104/63, pulse 80, temperature 98.6 F (37 C), temperature source Oral, resp. rate 16, height 5\' 10"  (1.778 m), weight 85.2 kg, SpO2 98 %.   Exam:  Awake Alert, No new F.N deficits, Normal affect [place].AT,PERRAL Supple Neck, No JVD,   Symmetrical Chest wall movement, Good air movement bilaterally, CTAB RRR,No Gallops, Rubs or new Murmurs,  +ve B.Sounds, Abd Soft, No tenderness,   No Cyanosis, Clubbing or edema    Assessment/Plan:  Sepsis due to recurrent MSSA bacteremia with native tricuspid valve endocarditis-s/p valve repair/annuloplasty on 04/13/22 -  Sepsis physiology resolved-discussed with infectious disease MD on 9/28 Dr. 10/28 - needs 6 weeks of Teflaro/daptomycin stop date 06/04/22 - this will unfortunately have to be done as an inpatient.  Goal INR 2-3-pharmacy now managing Coumadin.  Per CT surgery note-needs Coumadin for 3 months from valve repair.  Normocytic anemia:  Due to chronic disease/recurrent endocarditis: No indication for transfusion.  Anxiety -  Stable-on Lexapro  Methamphetamine use  In remission  Chronic methadone use - Requesting that we slowly taper down his methadone dosage-decreased to 130 mg today-we will continue to taper down over the next few days.  Chronic HCV   - outpatient follow-up with ID.  Mild transaminitis resolved  Nutrition Status: Nutrition Problem: Inadequate oral intake Etiology: vomiting, nausea Signs/Symptoms: per patient/family report Interventions: Ensure Enlive (each supplement provides 350kcal and 20 grams of protein), MVI, Snacks  BMI: Estimated body mass index is 26.95 kg/m as calculated from the following:   Height as of this encounter: 5\' 10"  (1.778 m).   Weight as of this encounter: 85.2 kg.   Code  status:   Code  Status: Full Code   DVT Prophylaxis: SCDs Start: 04/24/22 0836 SCDs Start: 04/23/22 1259 warfarin (COUMADIN) tablet 4 mg    Family Communication: None at bedside  Disposition Plan: Status is: Inpatient for 6 weeks of IV antibiotics stop date 06/04/22.   Diet: Diet Order             DIET DYS 3 Room service appropriate? Yes; Fluid consistency: Thin  Diet effective now                    MEDICATIONS: Scheduled Meds:  Ferrous Fumarate  1 tablet Oral BID WC   And   vitamin B-12  100 mcg Oral BID WC   And   ascorbic acid  500 mg Oral BID WC   And   folic acid  1 mg Oral BID WC   aspirin EC  81 mg Oral Daily   bisacodyl  10 mg Oral Daily   Or   bisacodyl  10 mg Rectal Daily   Chlorhexidine Gluconate Cloth  6 each Topical Daily   docusate sodium  200 mg Oral BID   escitalopram  10 mg Oral Daily   feeding supplement  237 mL Oral BID BM   gabapentin  300 mg Oral TID   methadone  105 mg Oral Daily   Followed by   Derrill Memo ON 05/20/2022] methadone  100 mg Oral Daily   Followed by   Derrill Memo ON 05/23/2022] methadone  95 mg Oral Daily   Followed by   Derrill Memo ON 05/26/2022] methadone  87.5 mg Oral Daily   Followed by   Derrill Memo ON 05/29/2022] methadone  82.5 mg Oral Daily   Followed by   Derrill Memo ON 06/01/2022] methadone  75 mg Oral Daily   multivitamin with minerals  1 tablet Oral Daily   nicotine  14 mg Transdermal Daily   pantoprazole  40 mg Oral Daily   warfarin  4 mg Oral q1600   Warfarin - Pharmacist Dosing Inpatient   Does not apply q1600   Continuous Infusions:  ceFTAROline (TEFLARO) IV 600 mg (05/18/22 0211)   DAPTOmycin (CUBICIN) 800 mg in sodium chloride 0.9 % IVPB 800 mg (05/17/22 2015)   promethazine (PHENERGAN) injection (IM or IVPB) 25 mg (05/18/22 0855)   PRN Meds:.acetaminophen, alum & mag hydroxide-simeth, bisacodyl, diclofenac Sodium, hydrOXYzine, loperamide, methocarbamol, mouth rinse, oxyCODONE, polyethylene glycol, promethazine  (PHENERGAN) injection (IM or IVPB), sodium chloride flush   I have personally reviewed following labs and imaging studies  LABORATORY DATA:  Recent Labs  Lab 05/15/22 0340  WBC 4.0  HGB 8.8*  HCT 28.9*  PLT 269  MCV 86.0  MCH 26.2  MCHC 30.4  RDW 14.6  LYMPHSABS 1.3  MONOABS 0.6  EOSABS 0.2  BASOSABS 0.0    Recent Labs  Lab 05/12/22 0420 05/13/22 0315 05/14/22 0325 05/15/22 0340 05/17/22 0507  NA  --   --   --  137  --   K  --   --   --  3.8  --   CL  --   --   --  103  --   CO2  --   --   --  27  --   GLUCOSE  --   --   --  94  --   BUN  --   --   --  11  --   CREATININE  --   --   --  0.77  --   CALCIUM  --   --   --  8.8*  --   AST  --   --   --  24  --   ALT  --   --   --  26  --   ALKPHOS  --   --   --  77  --   BILITOT  --   --   --  0.5  --   ALBUMIN  --   --   --  2.6*  --   MG  --   --   --  1.9  --   INR 2.0* 2.3* 2.6* 2.3* 2.0*     LOS: 82 days   Signature  Susa Raring M.D on 05/18/2022 at 9:35 AM   -  To page go to [email or web address]

## 2022-05-18 NOTE — Plan of Care (Signed)
  Problem: Education: Goal: Knowledge of General Education information will improve Description: Including pain rating scale, medication(s)/side effects and non-pharmacologic comfort measures Outcome: Progressing   Problem: Health Behavior/Discharge Planning: Goal: Ability to manage health-related needs will improve Outcome: Progressing   Problem: Clinical Measurements: Goal: Ability to maintain clinical measurements within normal limits will improve Outcome: Progressing Goal: Will remain free from infection Outcome: Progressing Goal: Diagnostic test results will improve Outcome: Progressing Goal: Respiratory complications will improve Outcome: Progressing Goal: Cardiovascular complication will be avoided Outcome: Progressing   Problem: Activity: Goal: Risk for activity intolerance will decrease Outcome: Progressing   Problem: Nutrition: Goal: Adequate nutrition will be maintained Outcome: Progressing   Problem: Coping: Goal: Level of anxiety will decrease Outcome: Progressing   Problem: Elimination: Goal: Will not experience complications related to bowel motility Outcome: Progressing Goal: Will not experience complications related to urinary retention Outcome: Progressing   Problem: Pain Managment: Goal: General experience of comfort will improve Outcome: Progressing   Problem: Safety: Goal: Ability to remain free from injury will improve Outcome: Progressing   Problem: Skin Integrity: Goal: Risk for impaired skin integrity will decrease Outcome: Progressing   Problem: Education: Goal: Will demonstrate proper wound care and an understanding of methods to prevent future damage Outcome: Progressing Goal: Knowledge of disease or condition will improve Outcome: Progressing Goal: Knowledge of the prescribed therapeutic regimen will improve Outcome: Progressing Goal: Individualized Educational Video(s) Outcome: Progressing   Problem: Activity: Goal: Risk for  activity intolerance will decrease Outcome: Progressing   Problem: Cardiac: Goal: Will achieve and/or maintain hemodynamic stability Outcome: Progressing   Problem: Clinical Measurements: Goal: Postoperative complications will be avoided or minimized Outcome: Progressing   Problem: Respiratory: Goal: Respiratory status will improve Outcome: Progressing   Problem: Skin Integrity: Goal: Wound healing without signs and symptoms of infection Outcome: Progressing Goal: Risk for impaired skin integrity will decrease Outcome: Progressing   Problem: Urinary Elimination: Goal: Ability to achieve and maintain adequate renal perfusion and functioning will improve Outcome: Progressing   Problem: Activity: Goal: Ability to tolerate increased activity will improve Outcome: Progressing   Problem: Respiratory: Goal: Ability to maintain a clear airway and adequate ventilation will improve Outcome: Progressing   Problem: Role Relationship: Goal: Method of communication will improve Outcome: Progressing   

## 2022-05-18 NOTE — Progress Notes (Signed)
Reginald Clarke is a 31 y.o. male patient. IV antibiotics administered. Patient [name]. Up ad lib independent.   1. Dehydration   2. Thrombocytopenia ([name])   3. Hypokalemia   4. Sepsis ([name])   5. Endocarditis of tricuspid valve   6. S/P tricuspid valve repair    Past Medical History:  Diagnosis Date   Anxiety    Endocarditis    Hepatitis    hx of hep C per pt   Opiate dependence ([name])    on methadone   Current Facility-Administered Medications  Medication Dose Route Frequency Provider Last Rate Last Admin   acetaminophen (TYLENOL) tablet 650 mg  650 mg Oral Q6H PRN Lightfoot, Lucile Crater, MD       alum & mag hydroxide-simeth (MAALOX/MYLANTA) 200-200-20 MG/5ML suspension 15 mL  15 mL Per Tube Q6H PRN Lajuana Matte, MD   15 mL at 05/18/22 0854   Ferrous Fumarate (HEMOCYTE - 106 mg FE) tablet 106 mg of iron  1 tablet Oral BID WC Reome, Earle J, RPH   106 mg of iron at 05/18/22 0857   And   vitamin B-12 (CYANOCOBALAMIN) tablet 100 mcg  100 mcg Oral BID WC Reome, Earle J, RPH   100 mcg at 05/18/22 0857   And   ascorbic acid (VITAMIN C) tablet 500 mg  500 mg Oral BID WC Reome, Earle J, RPH   500 mg at 00/93/81 8299   And   folic acid (FOLVITE) tablet 1 mg  1 mg Oral BID WC Reome, Earle J, RPH   1 mg at 05/18/22 3716   aspirin EC tablet 81 mg  81 mg Oral Daily Gold, Wayne E, PA-C   81 mg at 05/18/22 0831   bisacodyl (DULCOLAX) EC tablet 10 mg  10 mg Oral Daily Roddenberry, Myron G, PA-C   10 mg at 05/17/22 0901   Or   bisacodyl (DULCOLAX) suppository 10 mg  10 mg Rectal Daily Roddenberry, Myron G, PA-C       bisacodyl (DULCOLAX) EC tablet 10 mg  10 mg Oral Daily PRN Thurnell Lose, MD       ceftaroline (TEFLARO) 600 mg in sodium chloride 0.9 % 100 mL IVPB  600 mg Intravenous Q8H Mignon Pine, DO 100 mL/hr at 05/18/22 0941 600 mg at 05/18/22 0941   Chlorhexidine Gluconate Cloth 2 % PADS 6 each  6 each Topical Daily Mignon Pine, DO   6 each at 05/18/22 9678   DAPTOmycin  (CUBICIN) 800 mg in sodium chloride 0.9 % IVPB  800 mg Intravenous Q2000 Mignon Pine, DO 132 mL/hr at 05/17/22 2015 800 mg at 05/17/22 2015   diclofenac Sodium (VOLTAREN) 1 % topical gel 2 g  2 g Topical TID PRN Roddenberry, Myron G, PA-C       docusate sodium (COLACE) capsule 200 mg  200 mg Oral BID Thurnell Lose, MD       escitalopram (LEXAPRO) tablet 10 mg  10 mg Oral Daily Roddenberry, Myron G, PA-C   10 mg at 05/18/22 9381   feeding supplement (ENSURE ENLIVE / ENSURE PLUS) liquid 237 mL  237 mL Oral BID BM Lightfoot, Lucile Crater, MD   237 mL at 05/18/22 0854   gabapentin (NEURONTIN) capsule 300 mg  300 mg Oral TID Lajuana Matte, MD   300 mg at 05/18/22 0831   hydrOXYzine (ATARAX) tablet 25 mg  25 mg Oral TID PRN Edwin Dada, MD   25 mg at 05/04/22 [phone]  loperamide (IMODIUM) capsule 2 mg  2 mg Oral PRN Maretta Bees, MD   2 mg at 03/30/22 1153   methadone (DOLOPHINE) tablet 105 mg  105 mg Oral Daily Maretta Bees, MD   105 mg at 05/18/22 0831   Followed by   Melene Muller ON 05/20/2022] methadone (DOLOPHINE) tablet 100 mg  100 mg Oral Daily Ghimire, Werner Lean, MD       Followed by   Melene Muller ON 05/23/2022] methadone (DOLOPHINE) tablet 95 mg  95 mg Oral Daily Ghimire, Werner Lean, MD       Followed by   Melene Muller ON 05/26/2022] methadone (DOLOPHINE) tablet 87.5 mg  87.5 mg Oral Daily Ghimire, Werner Lean, MD       Followed by   Melene Muller ON 05/29/2022] methadone (DOLOPHINE) tablet 82.5 mg  82.5 mg Oral Daily Ghimire, Werner Lean, MD       Followed by   Melene Muller ON 06/01/2022] methadone (DOLOPHINE) tablet 75 mg  75 mg Oral Daily Ghimire, Werner Lean, MD       methocarbamol (ROBAXIN) tablet 500 mg  500 mg Oral Q8H PRN Roddenberry, Myron G, PA-C   500 mg at 04/29/22 2006   multivitamin with minerals tablet 1 tablet  1 tablet Oral Daily Roddenberry, Myron G, PA-C   1 tablet at 05/18/22 0830   nicotine (NICODERM CQ - dosed in mg/24 hours) patch 14 mg  14 mg Transdermal Daily  Roddenberry, Myron G, PA-C       Oral care mouth rinse  15 mL Mouth Rinse PRN Lightfoot, Eliezer Lofts, MD       oxyCODONE (Oxy IR/ROXICODONE) immediate release tablet 15 mg  15 mg Oral Q6H PRN Gershon Crane E, PA-C   15 mg at 04/29/22 2139   pantoprazole (PROTONIX) EC tablet 40 mg  40 mg Oral Daily Corliss Skains, MD   40 mg at 05/18/22 0831   polyethylene glycol (MIRALAX / GLYCOLAX) packet 17 g  17 g Oral Daily PRN Roddenberry, Myron G, PA-C       promethazine (PHENERGAN) 25 mg in sodium chloride 0.9 % 50 mL IVPB  25 mg Intravenous Q12H PRN Leroy Sea, MD 200 mL/hr at 05/18/22 0855 25 mg at 05/18/22 0855   sodium chloride flush (NS) 0.9 % injection 10-40 mL  10-40 mL Intracatheter PRN Kathlynn Grate, DO   10 mL at 05/14/22 1638   warfarin (COUMADIN) tablet 4 mg  4 mg Oral q1600 Tamera Reason, RPH   4 mg at 05/17/22 1555   Warfarin - Pharmacist Dosing Inpatient   Does not apply q1600 Maretta Bees, MD   Given at 05/17/22 1556   No Known Allergies Principal Problem:   Endocarditis of tricuspid valve Active Problems:   MRSA bacteremia   Chronic pain syndrome/methadone abuse    Tobacco abuse   Substance use disorder   Hepatitis C, chronic ([name])   Anxiety   Normocytic anemia   Prolonged QT interval   Sepsis ([name])   Elevated serum creatinine   Hypokalemia   Hypomagnesemia   Hyponatremia   Acute bacterial endocarditis   MSSA bacteremia   IVDU (intravenous drug user)   Pneumonia of both lower lobes due to methicillin resistant Staphylococcus aureus (MRSA) ([name])   Thrombocytopenia ([name])   Encounter for preoperative dental examination   Caries   Retained tooth root   Chronic apical periodontitis   Chronic periodontitis   Accretions on teeth   Impacted third molar tooth   Teeth missing  Phobia of dental procedure   Shock ([name])   Acute respiratory failure ([name])   Pleural effusion  Blood pressure (!) 115/53, pulse 68, temperature 98.5 F (36.9 C), temperature source  Oral, resp. rate 15, height 5\' 10"  (1.778 m), weight 85.2 kg, SpO2 98 %.  Subjective Objective: Vital signs: (most recent): Blood pressure (!) 115/53, pulse 68, temperature 98.5 F (36.9 C), temperature source Oral, resp. rate 15, height 5\' 10"  (1.778 m), weight 85.2 kg, SpO2 98 %.    Assessment & Plan  Arif Amendola A Doyel Mulkern 05/18/2022

## 2022-05-19 DIAGNOSIS — I079 Rheumatic tricuspid valve disease, unspecified: Secondary | ICD-10-CM | POA: Diagnosis not present

## 2022-05-19 LAB — PROTIME-INR
INR: 2.2 — ABNORMAL HIGH (ref 0.8–1.2)
Prothrombin Time: 23.8 seconds — ABNORMAL HIGH (ref 11.4–15.2)

## 2022-05-19 NOTE — Progress Notes (Signed)
PROGRESS NOTE        PATIENT DETAILS Name: Reginald Clarke Age: 31 y.o. Sex: male Date of Birth: 18-May-1991 Admit Date: 02/25/2022 Admitting Physician Dorcas Carrow, MD [id number], [name], PA-C  Brief Summary: Patient is a 31 y.o.  male with history of recurrent MSSA native tricuspid valve endocarditis in spite of completing several courses of IV antibiotics.  Evaluated by ID/CT surgery-underwent tricuspid valve debridement/repair/annuloplasty on 9/19.  Significant events: 4/12-5/10>> hospitalization for MSSA bacteremia with native TV endocarditis-angio vac procedure on 4/26.Completed cefazolin on 5/10-planned weekly dalbavancin at the ID clinic.   5/22>> outpatient blood culture positive for MSSA 5/24-6/8>> hospitalization [facility] for persistent bacteremia-worsening back pain-discharge to [facility] on IV Ancef. 7/5>> discharged from [facility] on oral cefdinir.  Continued to have intermittent fever postdischarge.   7/24>> admit for-persistent fever/nausea/vomiting/diarrhea-blood cultures positive for MSSA. 9/5>> recurrent fever- blood cultures growing Staph aureus  9/5 >> PICC line discontinued 9/19>> debridement of tricuspid valve/valve reconstruction/annuloplasty 9/27>> transfer to [facility] [place]  Significant studies: 7/25>> CT chest: Multifocal airspace opacities-increasing cavitary nodules 7/25>> MRI thoracic spine: No evidence of infection in thoracic spine.  Significant microbiology data: 7/24>> urine culture: Negative 7/25>> blood culture: 1/2-MSSA. 7/27>> blood culture: No growth 7/29>> blood culture: No growth 9/04>> blood cultures:MRSA 9/07>> blood cultures:neg 9/19>> tricuspid valve tissue culture: Staph aureus-sensitivities pending  Procedures: 8/3>> TEE: 3 vegetations on tricuspid valve-severe TR.  EF 60-65% 9/14>> multiple teeth extraction 9/19>> debridement of tricuspid valve/valve reconstruction/annuloplasty  Consults: ID CT surgery Dentist    Subjective:   Patient in bed, appears comfortable, denies any headache, no fever, no chest pain or pressure, no shortness of breath , no abdominal pain. No new focal weakness.   Objective:  Vitals: Blood pressure (!) 105/59, pulse 62, temperature 98.7 F (37.1 C), temperature source Oral, resp. rate 18, height 5\' 10"  (1.778 m), weight 85.8 kg, SpO2 99 %.   Exam:  Awake Alert, No new F.N deficits, Normal affect [place].AT,PERRAL Supple Neck, No JVD,   Symmetrical Chest wall movement, Good air movement bilaterally, CTAB RRR,No Gallops, Rubs or new Murmurs,  +ve B.Sounds, Abd Soft, No tenderness,   No Cyanosis, Clubbing or edema    Assessment/Plan:  Sepsis due to recurrent MSSA bacteremia with native tricuspid valve endocarditis-s/p valve repair/annuloplasty on 04/13/22 -  Sepsis physiology resolved-discussed with infectious disease MD on 9/28 Dr. 10/28 - needs 6 weeks of Teflaro/daptomycin stop date 06/04/22 - this will unfortunately have to be done as an inpatient.  Goal INR 2-3-pharmacy now managing Coumadin.  Per CT surgery note-needs Coumadin for 3 months from valve repair.  Normocytic anemia:  Due to chronic disease/recurrent endocarditis: No indication for transfusion.  Anxiety -  Stable-on Lexapro  Methamphetamine use  In remission  Chronic methadone use - Requesting that we slowly taper down his methadone dosage-decreased to 130 mg today-we will continue to taper down over the next few days.  Chronic HCV   - outpatient follow-up with ID.  Mild transaminitis resolved  Nutrition Status: Nutrition Problem: Inadequate oral intake Etiology: vomiting, nausea Signs/Symptoms: per patient/family report Interventions: Ensure Enlive (each supplement provides 350kcal and 20 grams of protein), MVI, Snacks  BMI: Estimated body mass index is 27.14 kg/m as calculated from the following:   Height as of this encounter: 5\' 10"  (1.778 m).   Weight as of this encounter: 85.8 kg.    Code status:  Code Status: Full Code   DVT Prophylaxis: SCDs Start: 04/24/22 0836 SCDs Start: 04/23/22 1259 warfarin (COUMADIN) tablet 4 mg    Family Communication: None at bedside  Disposition Plan: Status is: Inpatient for 6 weeks of IV antibiotics stop date 06/04/22.   Diet: Diet Order             DIET DYS 3 Room service appropriate? Yes; Fluid consistency: Thin  Diet effective now                    MEDICATIONS: Scheduled Meds:  Ferrous Fumarate  1 tablet Oral BID WC   And   vitamin B-12  100 mcg Oral BID WC   And   ascorbic acid  500 mg Oral BID WC   And   folic acid  1 mg Oral BID WC   aspirin EC  81 mg Oral Daily   bisacodyl  10 mg Oral Daily   Or   bisacodyl  10 mg Rectal Daily   Chlorhexidine Gluconate Cloth  6 each Topical Daily   docusate sodium  200 mg Oral BID   escitalopram  10 mg Oral Daily   feeding supplement  237 mL Oral BID BM   gabapentin  300 mg Oral TID   methadone  105 mg Oral Daily   Followed by   Derrill Memo ON 05/20/2022] methadone  100 mg Oral Daily   Followed by   Derrill Memo ON 05/23/2022] methadone  95 mg Oral Daily   Followed by   Derrill Memo ON 05/26/2022] methadone  87.5 mg Oral Daily   Followed by   Derrill Memo ON 05/29/2022] methadone  82.5 mg Oral Daily   Followed by   Derrill Memo ON 06/01/2022] methadone  75 mg Oral Daily   multivitamin with minerals  1 tablet Oral Daily   nicotine  14 mg Transdermal Daily   pantoprazole  40 mg Oral Daily   warfarin  4 mg Oral q1600   Warfarin - Pharmacist Dosing Inpatient   Does not apply q1600   Continuous Infusions:  ceFTAROline (TEFLARO) IV 600 mg (05/19/22 0201)   DAPTOmycin (CUBICIN) 800 mg in sodium chloride 0.9 % IVPB 800 mg (05/18/22 2005)   promethazine (PHENERGAN) injection (IM or IVPB) 25 mg (05/18/22 0855)   PRN Meds:.acetaminophen, alum & mag hydroxide-simeth, bisacodyl, diclofenac Sodium, hydrOXYzine, loperamide, methocarbamol, mouth rinse, oxyCODONE, polyethylene glycol, promethazine  (PHENERGAN) injection (IM or IVPB), sodium chloride flush   I have personally reviewed following labs and imaging studies  LABORATORY DATA:  Recent Labs  Lab 05/15/22 0340  WBC 4.0  HGB 8.8*  HCT 28.9*  PLT 269  MCV 86.0  MCH 26.2  MCHC 30.4  RDW 14.6  LYMPHSABS 1.3  MONOABS 0.6  EOSABS 0.2  BASOSABS 0.0    Recent Labs  Lab 05/13/22 0315 05/14/22 0325 05/15/22 0340 05/17/22 0507 05/19/22 0347  NA  --   --  137  --   --   K  --   --  3.8  --   --   CL  --   --  103  --   --   CO2  --   --  27  --   --   GLUCOSE  --   --  94  --   --   BUN  --   --  11  --   --   CREATININE  --   --  0.77  --   --   CALCIUM  --   --  8.8*  --   --   AST  --   --  24  --   --   ALT  --   --  26  --   --   ALKPHOS  --   --  77  --   --   BILITOT  --   --  0.5  --   --   ALBUMIN  --   --  2.6*  --   --   MG  --   --  1.9  --   --   INR 2.3* 2.6* 2.3* 2.0* 2.2*     LOS: 83 days   Signature  Susa Raring M.D on 05/19/2022 at 9:54 AM   -  To page go to [email or web address]

## 2022-05-19 NOTE — Progress Notes (Signed)
[facility] for Warfarin Indication:  tricuspid valve repair  No Known Allergies  Patient Measurements: Height: 5\' 10"  (177.8 cm) Weight: 85.8 kg (189 lb 2.5 oz) IBW/kg (Calculated) : 73  Vital Signs: Temp: 98.7 F (37.1 C) (10/15 0344) Temp Source: Oral (10/15 0344) BP: 105/59 (10/15 0344) Pulse Rate: 62 (10/15 0344)  Labs: Recent Labs    05/17/22 0507 05/19/22 0347  LABPROT 22.8* 23.8*  INR 2.0* 2.2*     Estimated Creatinine Clearance: 138.1 mL/min (by C-G formula based on SCr of 0.77 mg/dL).  Assessment: Reginald Clarke is a 31 yr old male s/p tricuspid valve repair on 04/23/22. Warfarin initiated post-op with plan to treat x 3 months per CT surgery.    Patient continues on ceftaroline and daptomycin through 10/31 (broad spectrum antibiotics may lead to increased INR).  May need higher dose once off IV antibiotics. -INR 2.1 today, at goal -H&H stable   Goal of Therapy:  INR 2-3 Monitor platelets by anticoagulation protocol: Yes   Plan:  Give warfarin  4 mg daily.   Check INR  every other day, next due 10/17.  Continue to monitor H&H and platelets   Thank you for allowing pharmacy to be a part of this patient's care.  Titus Dubin, PharmD PGY1 Pharmacy Resident 05/19/2022 8:26 AM

## 2022-05-20 DIAGNOSIS — I079 Rheumatic tricuspid valve disease, unspecified: Secondary | ICD-10-CM | POA: Diagnosis not present

## 2022-05-20 LAB — CBC WITH DIFFERENTIAL/PLATELET
Abs Immature Granulocytes: 0.02 10*3/uL (ref 0.00–0.07)
Basophils Absolute: 0 10*3/uL (ref 0.0–0.1)
Basophils Relative: 1 %
Eosinophils Absolute: 0.4 10*3/uL (ref 0.0–0.5)
Eosinophils Relative: 8 %
HCT: 30.2 % — ABNORMAL LOW (ref 39.0–52.0)
Hemoglobin: 9.5 g/dL — ABNORMAL LOW (ref 13.0–17.0)
Immature Granulocytes: 0 %
Lymphocytes Relative: 32 %
Lymphs Abs: 1.5 10*3/uL (ref 0.7–4.0)
MCH: 26.5 pg (ref 26.0–34.0)
MCHC: 31.5 g/dL (ref 30.0–36.0)
MCV: 84.4 fL (ref 80.0–100.0)
Monocytes Absolute: 0.6 10*3/uL (ref 0.1–1.0)
Monocytes Relative: 12 %
Neutro Abs: 2.1 10*3/uL (ref 1.7–7.7)
Neutrophils Relative %: 47 %
Platelets: 237 10*3/uL (ref 150–400)
RBC: 3.58 MIL/uL — ABNORMAL LOW (ref 4.22–5.81)
RDW: 14.6 % (ref 11.5–15.5)
WBC: 4.5 10*3/uL (ref 4.0–10.5)
nRBC: 0 % (ref 0.0–0.2)

## 2022-05-20 LAB — COMPREHENSIVE METABOLIC PANEL
ALT: 22 U/L (ref 0–44)
AST: 24 U/L (ref 15–41)
Albumin: 2.9 g/dL — ABNORMAL LOW (ref 3.5–5.0)
Alkaline Phosphatase: 78 U/L (ref 38–126)
Anion gap: 9 (ref 5–15)
BUN: 18 mg/dL (ref 6–20)
CO2: 28 mmol/L (ref 22–32)
Calcium: 9.5 mg/dL (ref 8.9–10.3)
Chloride: 102 mmol/L (ref 98–111)
Creatinine, Ser: 1.1 mg/dL (ref 0.61–1.24)
GFR, Estimated: >60 mL/min (ref >60)
Glucose, Bld: 90 mg/dL (ref 70–99)
Potassium: 4 mmol/L (ref 3.5–5.1)
Sodium: 139 mmol/L (ref 135–145)
Total Bilirubin: 0.2 mg/dL — ABNORMAL LOW (ref 0.3–1.2)
Total Protein: 6.6 g/dL (ref 6.5–8.1)

## 2022-05-20 LAB — MAGNESIUM: Magnesium: 1.9 mg/dL (ref 1.7–2.4)

## 2022-05-20 NOTE — Progress Notes (Signed)
PROGRESS NOTE        PATIENT DETAILS Name: Reginald Clarke Age: 31 y.o. Sex: male Date of Birth: Dec 31, 1990 Admit Date: 02/25/2022 Admitting Physician [name], MD [id number], Randa Evens, PA-C  Brief Summary: Patient is a 31 y.o.  male with history of recurrent MSSA native tricuspid valve endocarditis in spite of completing several courses of IV antibiotics.  Evaluated by ID/CT surgery-underwent tricuspid valve debridement/repair/annuloplasty on 9/19.  Significant events: 4/12-5/10>> hospitalization for MSSA bacteremia with native TV endocarditis-angio vac procedure on 4/26.Completed cefazolin on 5/10-planned weekly dalbavancin at the ID clinic.   5/22>> outpatient blood culture positive for MSSA 5/24-6/8>> hospitalization [facility] for persistent bacteremia-worsening back pain-discharge to [facility] on IV Ancef. 7/5>> discharged from [facility] on oral cefdinir.  Continued to have intermittent fever postdischarge.   7/24>> admit for-persistent fever/nausea/vomiting/diarrhea-blood cultures positive for MSSA. 9/5>> recurrent fever- blood cultures growing Staph aureus  9/5 >> PICC line discontinued 9/19>> debridement of tricuspid valve/valve reconstruction/annuloplasty 9/27>> transfer to [facility]  Significant studies: 7/25>> CT chest: Multifocal airspace opacities-increasing cavitary nodules 7/25>> MRI thoracic spine: No evidence of infection in thoracic spine.  Significant microbiology data: 7/24>> urine culture: Negative 7/25>> blood culture: 1/2-MSSA. 7/27>> blood culture: No growth 7/29>> blood culture: No growth 9/04>> blood cultures:MRSA 9/07>> blood cultures:neg 9/19>> tricuspid valve tissue culture: Staph aureus-sensitivities pending  Procedures: 8/3>> TEE: 3 vegetations on tricuspid valve-severe TR.  EF 60-65% 9/14>> multiple teeth extraction 9/19>> debridement of tricuspid valve/valve reconstruction/annuloplasty  Consults: ID CT surgery Dentist    Subjective:   Patient in bed, appears comfortable, denies any headache, no fever, no chest pain or pressure, no shortness of breath , no abdominal pain. No new focal weakness.  Objective:  Vitals: Blood pressure 118/63, pulse 63, temperature 97.6 F (36.4 C), temperature source Oral, resp. rate 17, height 5\' 10"  (1.778 m), weight 85.8 kg, SpO2 97 %.   Exam:  Awake Alert, No new F.N deficits, Normal affect [place].AT,PERRAL Supple Neck, No JVD,   Symmetrical Chest wall movement, Good air movement bilaterally, CTAB RRR,No Gallops, Rubs or new Murmurs,  +ve B.Sounds, Abd Soft, No tenderness,   No Cyanosis, Clubbing or edema    Assessment/Plan:  Sepsis due to recurrent MSSA bacteremia with native tricuspid valve endocarditis-s/p valve repair/annuloplasty on 04/13/22 -  Sepsis physiology resolved-discussed with infectious disease MD on 9/28 Dr. Juleen China - needs 6 weeks of Teflaro/daptomycin stop date 06/04/22 - this will unfortunately have to be done as an inpatient.  Goal INR 2-3-pharmacy now managing Coumadin.  Per CT surgery note-needs Coumadin for 3 months from valve repair.  Normocytic anemia:  Due to chronic disease/recurrent endocarditis: No indication for transfusion.  Anxiety -  Stable-on Lexapro  Methamphetamine use  In remission  Chronic methadone use - Requesting that we slowly taper down his methadone dosage-decreased to 130 mg today-we will continue to taper down over the next few days.  Chronic HCV   - outpatient follow-up with ID.  Mild transaminitis resolved  Nutrition Status: Nutrition Problem: Inadequate oral intake Etiology: vomiting, nausea Signs/Symptoms: per patient/family report Interventions: Ensure Enlive (each supplement provides 350kcal and 20 grams of protein), MVI, Snacks  BMI: Estimated body mass index is 27.14 kg/m as calculated from the following:   Height as of this encounter: 5\' 10"  (1.778 m).   Weight as of this encounter: 85.8 kg.   Code  status:   Code  Status: Full Code   DVT Prophylaxis: SCDs Start: 04/24/22 0836 SCDs Start: 04/23/22 1259 warfarin (COUMADIN) tablet 4 mg    Family Communication: None at bedside  Disposition Plan: Status is: Inpatient for 6 weeks of IV antibiotics stop date 06/04/22.   Diet: Diet Order             DIET DYS 3 Room service appropriate? Yes; Fluid consistency: Thin  Diet effective now                    MEDICATIONS: Scheduled Meds:  Ferrous Fumarate  1 tablet Oral BID WC   And   vitamin B-12  100 mcg Oral BID WC   And   ascorbic acid  500 mg Oral BID WC   And   folic acid  1 mg Oral BID WC   aspirin EC  81 mg Oral Daily   bisacodyl  10 mg Oral Daily   Or   bisacodyl  10 mg Rectal Daily   Chlorhexidine Gluconate Cloth  6 each Topical Daily   docusate sodium  200 mg Oral BID   escitalopram  10 mg Oral Daily   feeding supplement  237 mL Oral BID BM   gabapentin  300 mg Oral TID   methadone  100 mg Oral Daily   Followed by   Derrill Memo ON 05/23/2022] methadone  95 mg Oral Daily   Followed by   Derrill Memo ON 05/26/2022] methadone  87.5 mg Oral Daily   Followed by   Derrill Memo ON 05/29/2022] methadone  82.5 mg Oral Daily   Followed by   Derrill Memo ON 06/01/2022] methadone  75 mg Oral Daily   multivitamin with minerals  1 tablet Oral Daily   nicotine  14 mg Transdermal Daily   pantoprazole  40 mg Oral Daily   warfarin  4 mg Oral q1600   Warfarin - Pharmacist Dosing Inpatient   Does not apply q1600   Continuous Infusions:  ceFTAROline (TEFLARO) IV Stopped (05/20/22 1003)   DAPTOmycin (CUBICIN) 800 mg in sodium chloride 0.9 % IVPB 800 mg (05/19/22 2000)   promethazine (PHENERGAN) injection (IM or IVPB) 25 mg (05/20/22 1004)   PRN Meds:.acetaminophen, alum & mag hydroxide-simeth, bisacodyl, diclofenac Sodium, hydrOXYzine, loperamide, methocarbamol, mouth rinse, oxyCODONE, polyethylene glycol, promethazine (PHENERGAN) injection (IM or IVPB), sodium chloride flush   I have  personally reviewed following labs and imaging studies  LABORATORY DATA:  Recent Labs  Lab 05/15/22 0340 05/20/22 0342  WBC 4.0 4.5  HGB 8.8* 9.5*  HCT 28.9* 30.2*  PLT 269 237  MCV 86.0 84.4  MCH 26.2 26.5  MCHC 30.4 31.5  RDW 14.6 14.6  LYMPHSABS 1.3 1.5  MONOABS 0.6 0.6  EOSABS 0.2 0.4  BASOSABS 0.0 0.0    Recent Labs  Lab 05/14/22 0325 05/15/22 0340 05/17/22 0507 05/19/22 0347 05/20/22 0342  NA  --  137  --   --  139  K  --  3.8  --   --  4.0  CL  --  103  --   --  102  CO2  --  27  --   --  28  GLUCOSE  --  94  --   --  90  BUN  --  11  --   --  18  CREATININE  --  0.77  --   --  1.10  CALCIUM  --  8.8*  --   --  9.5  AST  --  24  --   --  24  ALT  --  26  --   --  22  ALKPHOS  --  77  --   --  78  BILITOT  --  0.5  --   --  0.2*  ALBUMIN  --  2.6*  --   --  2.9*  MG  --  1.9  --   --  1.9  INR 2.6* 2.3* 2.0* 2.2*  --      LOS: 84 days   Signature  Lala Lund M.D on 05/20/2022 at 10:08 AM   -  To page go to [email or web address]

## 2022-05-20 NOTE — Progress Notes (Signed)
[place] for Infectious Disease    Date of Admission:  02/25/2022   Total days of antibiotics 4 wk since valve repair           ID: Reginald Clarke is a 31 y.o. male with  MRSA/MSSA/DRSA Principal Problem:   Endocarditis of tricuspid valve Active Problems:   MRSA bacteremia   Chronic pain syndrome/methadone abuse    Tobacco abuse   Substance use disorder   Hepatitis C, chronic ([name])   Anxiety   Normocytic anemia   Prolonged QT interval   Sepsis ([name])   Elevated serum creatinine   Hypokalemia   Hypomagnesemia   Hyponatremia   Acute bacterial endocarditis   MSSA bacteremia   IVDU (intravenous drug user)   Pneumonia of both lower lobes due to methicillin resistant Staphylococcus aureus (MRSA) ([place])   Thrombocytopenia ([place])   Encounter for preoperative dental examination   [place]   Retained tooth root   Chronic apical periodontitis   Chronic periodontitis   Accretions on teeth   Impacted third molar tooth   Teeth missing   Phobia of dental procedure   Shock ([name])   Acute respiratory failure ([place])   Pleural effusion    Subjective: Afebrile tolerating iv abtx. No pain at picc line site  Medications:   Ferrous Fumarate  1 tablet Oral BID WC   And   vitamin B-12  100 mcg Oral BID WC   And   ascorbic acid  500 mg Oral BID WC   And   folic acid  1 mg Oral BID WC   aspirin EC  81 mg Oral Daily   bisacodyl  10 mg Oral Daily   Or   bisacodyl  10 mg Rectal Daily   Chlorhexidine Gluconate Cloth  6 each Topical Daily   docusate sodium  200 mg Oral BID   escitalopram  10 mg Oral Daily   feeding supplement  237 mL Oral BID BM   gabapentin  300 mg Oral TID   methadone  100 mg Oral Daily   Followed by   Derrill Memo ON 05/23/2022] methadone  95 mg Oral Daily   Followed by   Derrill Memo ON 05/26/2022] methadone  87.5 mg Oral Daily   Followed by   Derrill Memo ON 05/29/2022] methadone  82.5 mg Oral Daily   Followed by   Derrill Memo ON 06/01/2022] methadone  75 mg Oral Daily    multivitamin with minerals  1 tablet Oral Daily   nicotine  14 mg Transdermal Daily   pantoprazole  40 mg Oral Daily   warfarin  4 mg Oral q1600   Warfarin - Pharmacist Dosing Inpatient   Does not apply q1600    Objective: Vital signs in last 24 hours: Temp:  [97.6 F (36.4 C)-98.1 F (36.7 C)] 97.7 F (36.5 C) (10/16 1157) Pulse Rate:  [63-73] 73 (10/16 1157) Resp:  [16-18] 18 (10/16 1157) BP: (105-118)/(54-63) 107/57 (10/16 1157) SpO2:  [97 %-100 %] 97 % (10/16 0902) Weight:  [85.8 kg] 85.8 kg (10/16 0348) Physical Exam  Constitutional:  oriented to person, place, and time. appears well-developed and well-nourished. No distress.  HENT: [place]/AT, PERRLA, no scleral icterus Mouth/Throat: Oropharynx is clear and moist. No oropharyngeal exudate. edentulous Cardiovascular: Normal rate, regular rhythm and normal heart sounds. Exam reveals no gallop and no friction rub.  No murmur heard.  Pulmonary/Chest: Effort normal and breath sounds normal. No respiratory distress.  has no wheezes.  Neck = supple, no nuchal rigidity Abdominal: Soft. Bowel  sounds are normal.  exhibits no distension. There is no tenderness.  Lymphadenopathy: no cervical adenopathy. No axillary adenopathy Ext: picc line is c/d/i Neurological: alert and oriented to person, place, and time.  Skin: Skin is warm and dry. No rash noted. No erythema.  Psychiatric: a normal mood and affect.  behavior is normal.    Lab Results Recent Labs    05/20/22 0342  WBC 4.5  HGB 9.5*  HCT 30.2*  NA 139  K 4.0  CL 102  CO2 28  BUN 18  CREATININE 1.10   Liver Panel Recent Labs    05/20/22 0342  PROT 6.6  ALBUMIN 2.9*  AST 24  ALT 22  ALKPHOS 78  BILITOT 0.2*   Sedimentation Rate No results for input(s): "ESRSEDRATE" in the last 72 hours. C-Reactive Protein No results for input(s): "CRP" in the last 72 hours.  Microbiology: reviewed Studies/Results: No results found.   Assessment/Plan: TV endocarditis with  MRSA/VRSA/DRSA = continue with daptomycin and ceftaroline for 2 more weeks to complete 6 wk of treatment since valve replacement  Deconditioning = recommend to continue ambulation in the hallway  Moderate protein calorie malnutrition = continue with dietary supplementation  Chronic hepatitis c without hepatic coma = will test for HCV VL and plan to treat as outpatient; he is hep B and Hep A immune  [facility] for Infectious Diseases Pager: [phone]  05/20/2022, 8:24 PM

## 2022-05-21 DIAGNOSIS — I079 Rheumatic tricuspid valve disease, unspecified: Secondary | ICD-10-CM | POA: Diagnosis not present

## 2022-05-21 LAB — PROTIME-INR
INR: 1.9 — ABNORMAL HIGH (ref 0.8–1.2)
Prothrombin Time: 21.2 seconds — ABNORMAL HIGH (ref 11.4–15.2)

## 2022-05-21 LAB — CK: Total CK: 19 U/L — ABNORMAL LOW (ref 49–397)

## 2022-05-21 MED ORDER — WARFARIN SODIUM 4 MG PO TABS: 4.0000 mg | ORAL_TABLET | ORAL | Status: DC

## 2022-05-21 MED ORDER — WARFARIN SODIUM 6 MG PO TABS
6.0000 mg | ORAL_TABLET | ORAL | Status: DC
  Administered 2022-05-21: 6 mg via ORAL
  Filled 2022-05-21: qty 1

## 2022-05-21 NOTE — Progress Notes (Signed)
[place] for Warfarin Indication:  tricuspid valve repair  No Known Allergies  Patient Measurements: Height: 5\' 10"  (177.8 cm) Weight: 85.8 kg (189 lb 2.5 oz) IBW/kg (Calculated) : 73  Vital Signs: Temp: 98.1 F (36.7 C) (10/17 0845) Temp Source: Oral (10/17 0845) BP: 111/62 (10/17 0845) Pulse Rate: 59 (10/17 0845)  Labs: Recent Labs    05/19/22 0347 05/20/22 0342 05/21/22 0417  HGB  --  9.5*  --   HCT  --  30.2*  --   PLT  --  237  --   LABPROT 23.8*  --  21.2*  INR 2.2*  --  1.9*  CREATININE  --  1.10  --   CKTOTAL  --   --  19*     Estimated Creatinine Clearance: 100.5 mL/min (by C-G formula based on SCr of 1.1 mg/dL).  Assessment: Reginald Clarke is a 31 yr old male s/p tricuspid valve repair on 04/23/22. Warfarin initiated post-op with plan to treat x 3 months per CT surgery.    Patient continues on ceftaroline and daptomycin through 10/31 (broad spectrum antibiotics may lead to increased INR).  May need higher dose once off IV antibiotics. -INR decreased 1.9  today on 4mg  daily.   -H&H stable (10/16).  Will try 6mg  one day of week and 4mg  all other days of the week.  Possibly may need 6 mg twice week if INR remains <2.   Goal of Therapy:  INR 2-3 Monitor platelets by anticoagulation protocol: Yes   Plan:  Give Warfarin 6 mg  today/ qTue then 4mg  all other days of the week  Check INR daily for a few days then change to MWF. Continue to monitor H&H and platelets   Thank you for allowing pharmacy to be a part of this patient's care. Nicole Cella, RPh Clinical Pharmacist 05/21/2022 9:27 AM

## 2022-05-21 NOTE — Progress Notes (Signed)
PROGRESS NOTE        PATIENT DETAILS Name: Reginald Clarke Age: 31 y.o. Sex: male Date of Birth: September 08, 1990 Admit Date: 02/25/2022 Admitting Physician [name], MD [id number], Randa Evens, PA-C  Brief Summary: Patient is a 31 y.o.  male with history of recurrent MSSA native tricuspid valve endocarditis in spite of completing several courses of IV antibiotics.  Evaluated by ID/CT surgery-underwent tricuspid valve debridement/repair/annuloplasty on 9/19.  Significant events: 4/12-5/10>> hospitalization for MSSA bacteremia with native TV endocarditis-angio vac procedure on 4/26.Completed cefazolin on 5/10-planned weekly dalbavancin at the ID clinic.   5/22>> outpatient blood culture positive for MSSA 5/24-6/8>> hospitalization [facility] for persistent bacteremia-worsening back pain-discharge to [facility] on IV Ancef. 7/5>> discharged from [facility] on oral cefdinir.  Continued to have intermittent fever postdischarge.   7/24>> admit for-persistent fever/nausea/vomiting/diarrhea-blood cultures positive for MSSA. 9/5>> recurrent fever- blood cultures growing Staph aureus  9/5 >> PICC line discontinued 9/19>> debridement of tricuspid valve/valve reconstruction/annuloplasty 9/27>> transfer to [facility]  Significant studies: 7/25>> CT chest: Multifocal airspace opacities-increasing cavitary nodules 7/25>> MRI thoracic spine: No evidence of infection in thoracic spine.  Significant microbiology data: 7/24>> urine culture: Negative 7/25>> blood culture: 1/2-MSSA. 7/27>> blood culture: No growth 7/29>> blood culture: No growth 9/04>> blood cultures:MRSA 9/07>> blood cultures:neg 9/19>> tricuspid valve tissue culture: Staph aureus-sensitivities pending  Procedures: 8/3>> TEE: 3 vegetations on tricuspid valve-severe TR.  EF 60-65% 9/14>> multiple teeth extraction 9/19>> debridement of tricuspid valve/valve reconstruction/annuloplasty  Consults: ID CT surgery Dentist    Subjective:   Patient in bed, appears comfortable, denies any headache, no fever, no chest pain or pressure, no shortness of breath , no abdominal pain. No new focal weakness.   Objective:  Vitals: Blood pressure 111/62, pulse (!) 59, temperature 98.1 F (36.7 C), temperature source Oral, resp. rate 18, height 5\' 10"  (1.778 m), weight 85.8 kg, SpO2 99 %.   Exam:  Awake Alert, No new F.N deficits, Normal affect [place].AT,PERRAL Supple Neck, No JVD,   Symmetrical Chest wall movement, Good air movement bilaterally, CTAB RRR,No Gallops, Rubs or new Murmurs,  +ve B.Sounds, Abd Soft, No tenderness,   No Cyanosis, Clubbing or edema    Assessment/Plan:  Sepsis due to recurrent MSSA bacteremia with native tricuspid valve endocarditis-s/p valve repair/annuloplasty on 04/13/22 -  Sepsis physiology resolved-discussed with infectious disease MD on 9/28 Dr. Juleen China - needs 6 weeks of Teflaro/daptomycin stop date 06/04/22 - this will unfortunately have to be done as an inpatient.  Goal INR 2-3-pharmacy now managing Coumadin.  Per CT surgery note-needs Coumadin for 3 months from valve repair.  Normocytic anemia:  Due to chronic disease/recurrent endocarditis: No indication for transfusion.  Anxiety -  Stable-on Lexapro  Methamphetamine use  In remission  Chronic methadone use - Requesting that we slowly taper down his methadone dosage-decreased to 130 mg today-we will continue to taper down over the next few days.  Chronic HCV   - outpatient follow-up with ID.  Mild transaminitis resolved  Nutrition Status: Nutrition Problem: Inadequate oral intake Etiology: vomiting, nausea Signs/Symptoms: per patient/family report Interventions: Ensure Enlive (each supplement provides 350kcal and 20 grams of protein), MVI, Snacks  BMI: Estimated body mass index is 27.14 kg/m as calculated from the following:   Height as of this encounter: 5\' 10"  (1.778 m).   Weight as of this encounter: 85.8 kg.    Code status:  Code Status: Full Code   DVT Prophylaxis: SCDs Start: 04/24/22 0836 SCDs Start: 04/23/22 1259 warfarin (COUMADIN) tablet 6 mg  warfarin (COUMADIN) tablet 4 mg    Family Communication: None at bedside  Disposition Plan: Status is: Inpatient for 6 weeks of IV antibiotics stop date 06/04/22.   Diet: Diet Order             DIET DYS 3 Room service appropriate? Yes; Fluid consistency: Thin  Diet effective now                    MEDICATIONS: Scheduled Meds:  Ferrous Fumarate  1 tablet Oral BID WC   And   vitamin B-12  100 mcg Oral BID WC   And   ascorbic acid  500 mg Oral BID WC   And   folic acid  1 mg Oral BID WC   aspirin EC  81 mg Oral Daily   bisacodyl  10 mg Oral Daily   Or   bisacodyl  10 mg Rectal Daily   Chlorhexidine Gluconate Cloth  6 each Topical Daily   docusate sodium  200 mg Oral BID   escitalopram  10 mg Oral Daily   feeding supplement  237 mL Oral BID BM   gabapentin  300 mg Oral TID   methadone  100 mg Oral Daily   Followed by   Derrill Memo ON 05/23/2022] methadone  95 mg Oral Daily   Followed by   Derrill Memo ON 05/26/2022] methadone  87.5 mg Oral Daily   Followed by   Derrill Memo ON 05/29/2022] methadone  82.5 mg Oral Daily   Followed by   Derrill Memo ON 06/01/2022] methadone  75 mg Oral Daily   multivitamin with minerals  1 tablet Oral Daily   nicotine  14 mg Transdermal Daily   pantoprazole  40 mg Oral Daily   warfarin  6 mg Oral Once per day on Tue   And   [START ON 05/22/2022] warfarin  4 mg Oral Once per day on Sun Mon Wed Thu Fri Sat   Warfarin - Pharmacist Dosing Inpatient   Does not apply q1600   Continuous Infusions:  ceFTAROline (TEFLARO) IV 600 mg (05/21/22 0849)   DAPTOmycin (CUBICIN) 800 mg in sodium chloride 0.9 % IVPB 800 mg (05/20/22 2108)   promethazine (PHENERGAN) injection (IM or IVPB) Stopped (05/20/22 1020)   PRN Meds:.acetaminophen, alum & mag hydroxide-simeth, bisacodyl, diclofenac Sodium, hydrOXYzine, loperamide,  methocarbamol, mouth rinse, oxyCODONE, polyethylene glycol, promethazine (PHENERGAN) injection (IM or IVPB), sodium chloride flush   I have personally reviewed following labs and imaging studies  LABORATORY DATA:  Recent Labs  Lab 05/15/22 0340 05/20/22 0342  WBC 4.0 4.5  HGB 8.8* 9.5*  HCT 28.9* 30.2*  PLT 269 237  MCV 86.0 84.4  MCH 26.2 26.5  MCHC 30.4 31.5  RDW 14.6 14.6  LYMPHSABS 1.3 1.5  MONOABS 0.6 0.6  EOSABS 0.2 0.4  BASOSABS 0.0 0.0    Recent Labs  Lab 05/15/22 0340 05/17/22 0507 05/19/22 0347 05/20/22 0342 05/21/22 0417  NA 137  --   --  139  --   K 3.8  --   --  4.0  --   CL 103  --   --  102  --   CO2 27  --   --  28  --   GLUCOSE 94  --   --  90  --   BUN 11  --   --  18  --  CREATININE 0.77  --   --  1.10  --   CALCIUM 8.8*  --   --  9.5  --   AST 24  --   --  24  --   ALT 26  --   --  22  --   ALKPHOS 77  --   --  78  --   BILITOT 0.5  --   --  0.2*  --   ALBUMIN 2.6*  --   --  2.9*  --   MG 1.9  --   --  1.9  --   INR 2.3* 2.0* 2.2*  --  1.9*     LOS: 85 days   Signature  Lala Lund M.D on 05/21/2022 at 9:28 AM   -  To page go to [email or web address]

## 2022-05-22 DIAGNOSIS — I079 Rheumatic tricuspid valve disease, unspecified: Secondary | ICD-10-CM | POA: Diagnosis not present

## 2022-05-22 LAB — PROTIME-INR
INR: 1.9 — ABNORMAL HIGH (ref 0.8–1.2)
Prothrombin Time: 21.3 seconds — ABNORMAL HIGH (ref 11.4–15.2)

## 2022-05-22 MED ORDER — WARFARIN SODIUM 4 MG PO TABS
4.0000 mg | ORAL_TABLET | ORAL | Status: DC
  Administered 2022-05-23 – 2022-06-03 (×10): 4 mg via ORAL
  Filled 2022-05-22 (×11): qty 1

## 2022-05-22 MED ORDER — WARFARIN SODIUM 6 MG PO TABS
6.0000 mg | ORAL_TABLET | ORAL | Status: DC
  Administered 2022-05-22 – 2022-05-29 (×3): 6 mg via ORAL
  Filled 2022-05-22 (×4): qty 1

## 2022-05-22 NOTE — Progress Notes (Signed)
PROGRESS NOTE        PATIENT DETAILS Name: Reginald Clarke Age: 31 y.o. Sex: male Date of Birth: 1991-04-27 Admit Date: 02/25/2022 Admitting Physician [name], MD [id number], Randa Evens, PA-C  Brief Summary: Patient is a 31 y.o.  male with history of recurrent MSSA native tricuspid valve endocarditis in spite of completing several courses of IV antibiotics.  Evaluated by ID/CT surgery-underwent tricuspid valve debridement/repair/annuloplasty on 9/19.  Significant events: 4/12-5/10>> hospitalization for MSSA bacteremia with native TV endocarditis-angio vac procedure on 4/26.Completed cefazolin on 5/10-planned weekly dalbavancin at the ID clinic.   5/22>> outpatient blood culture positive for MSSA 5/24-6/8>> hospitalization [facility] for persistent bacteremia-worsening back pain-discharge to [facility] on IV Ancef. 7/5>> discharged from [facility] on oral cefdinir.  Continued to have intermittent fever postdischarge.   7/24>> admit for-persistent fever/nausea/vomiting/diarrhea-blood cultures positive for MSSA. 9/5>> recurrent fever- blood cultures growing Staph aureus  9/5 >> PICC line discontinued 9/19>> debridement of tricuspid valve/valve reconstruction/annuloplasty 9/27>> transfer to [facility]  Significant studies: 7/25>> CT chest: Multifocal airspace opacities-increasing cavitary nodules 7/25>> MRI thoracic spine: No evidence of infection in thoracic spine.  Significant microbiology data: 7/24>> urine culture: Negative 7/25>> blood culture: 1/2-MSSA. 7/27>> blood culture: No growth 7/29>> blood culture: No growth 9/04>> blood cultures:MRSA 9/07>> blood cultures:neg 9/19>> tricuspid valve tissue culture: Staph aureus-sensitivities pending  Procedures: 8/3>> TEE: 3 vegetations on tricuspid valve-severe TR.  EF 60-65% 9/14>> multiple teeth extraction 9/19>> debridement of tricuspid valve/valve reconstruction/annuloplasty  Consults: ID CT surgery Dentist    Subjective:    No complaints.  No shortness of breath, chest pain, dizziness .girlfriend sleeping in the same bed with him    Objective:  Vitals: Blood pressure 101/63, pulse 80, temperature 98.2 F (36.8 C), temperature source Oral, resp. rate 18, height 5\' 10"  (1.778 m), weight 86.1 kg, SpO2 99 %.   Exam: Calm, NAD Cta no w/r Reg s1/s2 no gallop Soft benign +bs No edema Aaoxox3  Mood and affect appropriate in current setting    Assessment/Plan:  Sepsis due to recurrent MSSA bacteremia with native tricuspid valve endocarditis-s/p valve repair/annuloplasty on 04/13/22 -  Sepsis physiology resolved-discussed with infectious disease MD on 9/28 Dr. Juleen China - needs 6 weeks of Teflaro/daptomycin stop date 06/04/22 - this will unfortunately have to be done as an inpatient.  Goal INR 2-3-pharmacy now managing Coumadin.  Per CT surgery note-needs Coumadin for 3 months from valve repair. 10/18 Coumadin managed by pharmacy   Normocytic anemia:  Due to chronic disease/recurrent endocarditis: No indication for transfusion. 10/18 H&H stable   Anxiety -   Stable on Lexapro     Methamphetamine use   In remission    Chronic methadone use - Requesting that we slowly taper down his methadone dosage-decreased to 130 mg today-we will continue to taper down over the next few days.  Chronic HCV   - outpatient follow-up with ID.  Mild transaminitis resolved  Nutrition Status: Nutrition Problem: Inadequate oral intake Etiology: vomiting, nausea Signs/Symptoms: per patient/family report Interventions: Ensure Enlive (each supplement provides 350kcal and 20 grams of protein), MVI, Snacks  BMI: Estimated body mass index is 27.24 kg/m as calculated from the following:   Height as of this encounter: 5\' 10"  (1.778 m).   Weight as of this encounter: 86.1 kg.   Code status:   Code Status: Full Code   DVT Prophylaxis: SCDs Start: 04/24/22 3220  SCDs Start: 04/23/22 1259 warfarin  (COUMADIN) tablet 6 mg  warfarin (COUMADIN) tablet 4 mg    Family Communication: Girlfriend at bedside  Disposition Plan: Status is: Inpatient for 6 weeks of IV antibiotics stop date 06/04/22.   Diet: Diet Order             DIET DYS 3 Room service appropriate? Yes; Fluid consistency: Thin  Diet effective now                    MEDICATIONS: Scheduled Meds:  Ferrous Fumarate  1 tablet Oral BID WC   And   vitamin B-12  100 mcg Oral BID WC   And   ascorbic acid  500 mg Oral BID WC   And   folic acid  1 mg Oral BID WC   aspirin EC  81 mg Oral Daily   bisacodyl  10 mg Oral Daily   Or   bisacodyl  10 mg Rectal Daily   Chlorhexidine Gluconate Cloth  6 each Topical Daily   docusate sodium  200 mg Oral BID   escitalopram  10 mg Oral Daily   feeding supplement  237 mL Oral BID BM   gabapentin  300 mg Oral TID   [START ON 05/23/2022] methadone  95 mg Oral Daily   Followed by   Melene Muller ON 05/26/2022] methadone  87.5 mg Oral Daily   Followed by   Melene Muller ON 05/29/2022] methadone  82.5 mg Oral Daily   Followed by   Melene Muller ON 06/01/2022] methadone  75 mg Oral Daily   multivitamin with minerals  1 tablet Oral Daily   nicotine  14 mg Transdermal Daily   pantoprazole  40 mg Oral Daily   warfarin  6 mg Oral Once per day on Tue Wed   And   [START ON 05/23/2022] warfarin  4 mg Oral Once per day on Sun Mon Thu Fri Sat   Warfarin - Pharmacist Dosing Inpatient   Does not apply q1600   Continuous Infusions:  ceFTAROline (TEFLARO) IV 600 mg (05/22/22 1035)   DAPTOmycin (CUBICIN) 800 mg in sodium chloride 0.9 % IVPB 800 mg (05/21/22 2047)   promethazine (PHENERGAN) injection (IM or IVPB) Stopped (05/20/22 1020)   PRN Meds:.acetaminophen, alum & mag hydroxide-simeth, bisacodyl, diclofenac Sodium, hydrOXYzine, loperamide, methocarbamol, mouth rinse, oxyCODONE, polyethylene glycol, promethazine (PHENERGAN) injection (IM or IVPB), sodium chloride flush   I have personally reviewed  following labs and imaging studies  LABORATORY DATA:  Recent Labs  Lab 05/20/22 0342  WBC 4.5  HGB 9.5*  HCT 30.2*  PLT 237  MCV 84.4  MCH 26.5  MCHC 31.5  RDW 14.6  LYMPHSABS 1.5  MONOABS 0.6  EOSABS 0.4  BASOSABS 0.0    Recent Labs  Lab 05/17/22 0507 05/19/22 0347 05/20/22 0342 05/21/22 0417 05/22/22 0417  NA  --   --  139  --   --   K  --   --  4.0  --   --   CL  --   --  102  --   --   CO2  --   --  28  --   --   GLUCOSE  --   --  90  --   --   BUN  --   --  18  --   --   CREATININE  --   --  1.10  --   --   CALCIUM  --   --  9.5  --   --  AST  --   --  24  --   --   ALT  --   --  22  --   --   ALKPHOS  --   --  78  --   --   BILITOT  --   --  0.2*  --   --   ALBUMIN  --   --  2.9*  --   --   MG  --   --  1.9  --   --   INR 2.0* 2.2*  --  1.9* 1.9*     LOS: 86 days   Signature  Lynn Ito M.D on 05/22/2022 at 1:57 PM   -  To page go to [email or web address]

## 2022-05-22 NOTE — Progress Notes (Signed)
[place] for Warfarin Indication:  tricuspid valve repair  No Known Allergies  Patient Measurements: Height: 5\' 10"  (177.8 cm) Weight: 86.1 kg (189 lb 13.1 oz) IBW/kg (Calculated) : 73  Vital Signs: Temp: 98.2 F (36.8 C) (10/18 0300) Temp Source: Oral (10/18 0300) BP: 115/71 (10/18 0300) Pulse Rate: 74 (10/18 0300)  Labs: Recent Labs    05/20/22 0342 05/21/22 0417 05/22/22 0417  HGB 9.5*  --   --   HCT 30.2*  --   --   PLT 237  --   --   LABPROT  --  21.2* 21.3*  INR  --  1.9* 1.9*  CREATININE 1.10  --   --   CKTOTAL  --  19*  --      Estimated Creatinine Clearance: 100.5 mL/min (by C-G formula based on SCr of 1.1 mg/dL).  Assessment: Reginald Clarke is a 31 yr old male s/p tricuspid valve repair on 04/23/22. Warfarin initiated post-op with plan to treat x 3 months per CT surgery.    Patient continues on ceftaroline and daptomycin through 10/31 (broad spectrum antibiotics may lead to increased INR).  May need higher dose once off IV antibiotics. INR decreased to 1.9 yesterday on 4mg  daily and INR remains 1.9  today.  Warfarin 6mg  dose given on 10/17, and likely have not seen effect of this dose yet.   CBC stable (10/16) Hgb 8s-9s stable, pltc wnl stable. No bleeding reported.  I will adjust dose to 6mg  2 days/weekly and 4mg  all other days = 32 mg/week versus our previous dose regimen of 4mg  daily = 28 mg/week,  to target INR 2-3.   Goal of Therapy:  INR 2-3 Monitor platelets by anticoagulation protocol: Yes   Plan:  Give Warfarin 6 mg everyTue & Wed and 4mg  every MThFSS   Monitor INR qMWF   Continue to monitor H&H and platelets   Thank you for allowing pharmacy to be a part of this patient's care. Nicole Cella, RPh Clinical Pharmacist 05/22/2022 11:45 AM

## 2022-05-23 DIAGNOSIS — I079 Rheumatic tricuspid valve disease, unspecified: Secondary | ICD-10-CM | POA: Diagnosis not present

## 2022-05-23 NOTE — Progress Notes (Signed)
PROGRESS NOTE        PATIENT DETAILS Name: Reginald Clarke Age: 31 y.o. Sex: male Date of Birth: 26-Mar-1991 Admit Date: 02/25/2022 Admitting Physician [name], MD [id number], Randa Evens, PA-C  Brief Summary: Patient is a 31 y.o.  male with history of recurrent MSSA native tricuspid valve endocarditis in spite of completing several courses of IV antibiotics.  Evaluated by ID/CT surgery-underwent tricuspid valve debridement/repair/annuloplasty on 9/19.  Significant events: 4/12-5/10>> hospitalization for MSSA bacteremia with native TV endocarditis-angio vac procedure on 4/26.Completed cefazolin on 5/10-planned weekly dalbavancin at the ID clinic.   5/22>> outpatient blood culture positive for MSSA 5/24-6/8>> hospitalization [facility] for persistent bacteremia-worsening back pain-discharge to [facility] on IV Ancef. 7/5>> discharged from [facility] on oral cefdinir.  Continued to have intermittent fever postdischarge.   7/24>> admit for-persistent fever/nausea/vomiting/diarrhea-blood cultures positive for MSSA. 9/5>> recurrent fever- blood cultures growing Staph aureus  9/5 >> PICC line discontinued 9/19>> debridement of tricuspid valve/valve reconstruction/annuloplasty 9/27>> transfer to [facility]  Significant studies: 7/25>> CT chest: Multifocal airspace opacities-increasing cavitary nodules 7/25>> MRI thoracic spine: No evidence of infection in thoracic spine.  Significant microbiology data: 7/24>> urine culture: Negative 7/25>> blood culture: 1/2-MSSA. 7/27>> blood culture: No growth 7/29>> blood culture: No growth 9/04>> blood cultures:MRSA 9/07>> blood cultures:neg 9/19>> tricuspid valve tissue culture: Staph aureus-sensitivities pending  Procedures: 8/3>> TEE: 3 vegetations on tricuspid valve-severe TR.  EF 60-65% 9/14>> multiple teeth extraction 9/19>> debridement of tricuspid valve/valve reconstruction/annuloplasty  Consults: ID CT surgery Dentist    Subjective:    No shortness of breath, chest pain dizziness or abdominal pain   Objective:  Vitals: Blood pressure (!) 97/55, pulse 75, temperature 98.1 F (36.7 C), temperature source Oral, resp. rate 18, height 5\' 10"  (1.778 m), weight 86.1 kg, SpO2 98 %.   Exam: Calm, NAD Cta no w/r Reg s1/s2 no gallop Soft benign +bs No edema Aaoxox3  Mood and affect appropriate in current setting    Assessment/Plan:  Sepsis due to recurrent MSSA bacteremia with native tricuspid valve endocarditis-s/p valve repair/annuloplasty on 04/13/22 -  Sepsis physiology resolved-discussed with infectious disease MD on 9/28 Dr. Juleen China - needs 6 weeks of Teflaro/daptomycin stop date 06/04/22 - this will unfortunately have to be done as an inpatient.  Goal INR 2-3-pharmacy now managing Coumadin.  Per CT surgery note-needs Coumadin for 3 months from valve repair. 10/19 Coumadin managed by pharmacy      Normocytic anemia:  Due to chronic disease/recurrent endocarditis: No indication for transfusion. 10/19 H&H stable Monitor periodically   Anxiety -   Stable On Lexapro      Methamphetamine use   In remission   Chronic methadone use - Requesting that we slowly taper down his methadone dosage-decreased to 130 mg today-we will continue to taper down over the next few days.  Chronic HCV   - outpatient follow-up with ID.  Mild transaminitis resolved  Nutrition Status: Nutrition Problem: Inadequate oral intake Etiology: vomiting, nausea Signs/Symptoms: per patient/family report Interventions: Ensure Enlive (each supplement provides 350kcal and 20 grams of protein), MVI, Snacks  BMI: Estimated body mass index is 27.24 kg/m as calculated from the following:   Height as of this encounter: 5\' 10"  (1.778 m).   Weight as of this encounter: 86.1 kg.   Code status:   Code Status: Full Code   DVT Prophylaxis: SCDs Start: 04/24/22 0836 SCDs Start: 04/23/22  1259 warfarin (COUMADIN) tablet 6  mg  warfarin (COUMADIN) tablet 4 mg    Family Communication: Girlfriend in bedside  Disposition Plan: Status is: Inpatient for 6 weeks of IV antibiotics stop date 06/04/22.   Diet: Diet Order             DIET DYS 3 Room service appropriate? Yes; Fluid consistency: Thin  Diet effective now                    MEDICATIONS: Scheduled Meds:  Ferrous Fumarate  1 tablet Oral BID WC   And   vitamin B-12  100 mcg Oral BID WC   And   ascorbic acid  500 mg Oral BID WC   And   folic acid  1 mg Oral BID WC   aspirin EC  81 mg Oral Daily   bisacodyl  10 mg Oral Daily   Or   bisacodyl  10 mg Rectal Daily   Chlorhexidine Gluconate Cloth  6 each Topical Daily   docusate sodium  200 mg Oral BID   escitalopram  10 mg Oral Daily   feeding supplement  237 mL Oral BID BM   gabapentin  300 mg Oral TID   methadone  95 mg Oral Daily   Followed by   Melene Muller ON 05/26/2022] methadone  87.5 mg Oral Daily   Followed by   Melene Muller ON 05/29/2022] methadone  82.5 mg Oral Daily   Followed by   Melene Muller ON 06/01/2022] methadone  75 mg Oral Daily   multivitamin with minerals  1 tablet Oral Daily   nicotine  14 mg Transdermal Daily   pantoprazole  40 mg Oral Daily   warfarin  6 mg Oral Once per day on Tue Wed   And   warfarin  4 mg Oral Once per day on Sun Mon Thu Fri Sat   Warfarin - Pharmacist Dosing Inpatient   Does not apply q1600   Continuous Infusions:  ceFTAROline (TEFLARO) IV 600 mg (05/23/22 1025)   DAPTOmycin (CUBICIN) 800 mg in sodium chloride 0.9 % IVPB 800 mg (05/22/22 2118)   promethazine (PHENERGAN) injection (IM or IVPB) Stopped (05/20/22 1020)   PRN Meds:.acetaminophen, alum & mag hydroxide-simeth, bisacodyl, diclofenac Sodium, hydrOXYzine, loperamide, methocarbamol, mouth rinse, oxyCODONE, polyethylene glycol, promethazine (PHENERGAN) injection (IM or IVPB), sodium chloride flush   I have personally reviewed following labs and imaging studies  LABORATORY DATA:  Recent Labs   Lab 05/20/22 0342  WBC 4.5  HGB 9.5*  HCT 30.2*  PLT 237  MCV 84.4  MCH 26.5  MCHC 31.5  RDW 14.6  LYMPHSABS 1.5  MONOABS 0.6  EOSABS 0.4  BASOSABS 0.0    Recent Labs  Lab 05/17/22 0507 05/19/22 0347 05/20/22 0342 05/21/22 0417 05/22/22 0417  NA  --   --  139  --   --   K  --   --  4.0  --   --   CL  --   --  102  --   --   CO2  --   --  28  --   --   GLUCOSE  --   --  90  --   --   BUN  --   --  18  --   --   CREATININE  --   --  1.10  --   --   CALCIUM  --   --  9.5  --   --   AST  --   --  24  --   --   ALT  --   --  22  --   --   ALKPHOS  --   --  78  --   --   BILITOT  --   --  0.2*  --   --   ALBUMIN  --   --  2.9*  --   --   MG  --   --  1.9  --   --   INR 2.0* 2.2*  --  1.9* 1.9*   Time spent 35 minutes   LOS: 87 days   Signature  Lynn Ito M.D on 05/23/2022 at 10:34 AM   -  To page go to [email or web address]

## 2022-05-23 NOTE — Plan of Care (Signed)
  Problem: Education: Goal: Knowledge of General Education information will improve Description Including pain rating scale, medication(s)/side effects and non-pharmacologic comfort measures Outcome: Progressing   Problem: Clinical Measurements: Goal: Ability to maintain clinical measurements within normal limits will improve Outcome: Progressing   Problem: Activity: Goal: Risk for activity intolerance will decrease Outcome: Progressing   

## 2022-05-24 DIAGNOSIS — I079 Rheumatic tricuspid valve disease, unspecified: Secondary | ICD-10-CM | POA: Diagnosis not present

## 2022-05-24 LAB — PROTIME-INR
INR: 2.5 — ABNORMAL HIGH (ref 0.8–1.2)
Prothrombin Time: 26.8 seconds — ABNORMAL HIGH (ref 11.4–15.2)

## 2022-05-24 NOTE — Progress Notes (Signed)
Patient [id number] Harts      DOB: 1991-05-08      KCL:[id number]      Palliative Medicine Team   Palliative team to sign off at this time. Chart checks periodically have been performed for last few weeks with steady improvement noted. Patient with great progress and no current palliative medicine needs. Our team is available for reconsult if appropriate. Thank you for allowing the Palliative Medicine Team to assist in the care of this patient.     Damian Leavell, MSN, [facility] Palliative Medicine Team Team Phone: [phone]  This phone is monitored 7a-7p, please reach out to attending physician outside of these hours for urgent needs.

## 2022-05-24 NOTE — Progress Notes (Signed)
[facility] for Warfarin Indication:  tricuspid valve repair  No Known Allergies  Patient Measurements: Height: 5\' 10"  (177.8 cm) Weight: 86.1 kg (189 lb 13.1 oz) IBW/kg (Calculated) : 73  Vital Signs: Temp: 98.8 F (37.1 C) (10/20 0900) Temp Source: Oral (10/20 0900) BP: 110/65 (10/20 0900) Pulse Rate: 68 (10/20 0900)  Labs: Recent Labs    05/22/22 0417 05/24/22 0358  LABPROT 21.3* 26.8*  INR 1.9* 2.5*    Estimated Creatinine Clearance: 100.5 mL/min (by C-G formula based on SCr of 1.1 mg/dL).  Assessment: Reginald Clarke is a 31 yr old male s/p tricuspid valve repair on 04/23/22. Warfarin initiated post-op with plan to treat x 3 months per CT surgery.    Patient continues on ceftaroline and daptomycin through 10/31 (broad spectrum antibiotics may lead to increased INR).  May need higher dose once off IV antibiotics.  INR increased to goal at 2.5 on Warfarin regimen of 6mg  Tuesday and Wednesday and 4mg  daily all other days. Dose adjustment occurred on 10/18 and seeing effects on 10/19. CBC stable (10/16) Hgb 8s-9s stable, pltc wnl stable. No bleeding reported.   Goal of Therapy:  INR 2-3 Monitor platelets by anticoagulation protocol: Yes   Plan:  Continue Warfarin 6 mg everyTue & Wed and 4mg  every MThFSS   Monitor INR qMWF   Continue to monitor H&H and platelets   Thank you for allowing pharmacy to be a part of this patient's care. Sloan Leiter, PharmD, BCPS, BCCCP Clinical Pharmacist Please refer to [facility] for [place] numbers 05/24/2022 10:28 AM

## 2022-05-24 NOTE — Plan of Care (Signed)
  Problem: Education: Goal: Knowledge of General Education information will improve Description: Including pain rating scale, medication(s)/side effects and non-pharmacologic comfort measures Outcome: Progressing   Problem: Health Behavior/Discharge Planning: Goal: Ability to manage health-related needs will improve Outcome: Progressing   Problem: Clinical Measurements: Goal: Ability to maintain clinical measurements within normal limits will improve Outcome: Progressing Goal: Will remain free from infection Outcome: Progressing Goal: Diagnostic test results will improve Outcome: Progressing Goal: Respiratory complications will improve Outcome: Progressing Goal: Cardiovascular complication will be avoided Outcome: Progressing   Problem: Activity: Goal: Risk for activity intolerance will decrease Outcome: Progressing   Problem: Nutrition: Goal: Adequate nutrition will be maintained Outcome: Progressing   Problem: Coping: Goal: Level of anxiety will decrease Outcome: Progressing   Problem: Elimination: Goal: Will not experience complications related to bowel motility Outcome: Progressing Goal: Will not experience complications related to urinary retention Outcome: Progressing   Problem: Pain Managment: Goal: General experience of comfort will improve Outcome: Progressing   Problem: Safety: Goal: Ability to remain free from injury will improve Outcome: Progressing   Problem: Skin Integrity: Goal: Risk for impaired skin integrity will decrease Outcome: Progressing   Problem: Education: Goal: Will demonstrate proper wound care and an understanding of methods to prevent future damage Outcome: Progressing Goal: Knowledge of disease or condition will improve Outcome: Progressing Goal: Knowledge of the prescribed therapeutic regimen will improve Outcome: Progressing Goal: Individualized Educational Video(s) Outcome: Progressing   Problem: Activity: Goal: Risk for  activity intolerance will decrease Outcome: Progressing   Problem: Cardiac: Goal: Will achieve and/or maintain hemodynamic stability Outcome: Progressing   Problem: Clinical Measurements: Goal: Postoperative complications will be avoided or minimized Outcome: Progressing   Problem: Respiratory: Goal: Respiratory status will improve Outcome: Progressing   Problem: Skin Integrity: Goal: Wound healing without signs and symptoms of infection Outcome: Progressing Goal: Risk for impaired skin integrity will decrease Outcome: Progressing   Problem: Urinary Elimination: Goal: Ability to achieve and maintain adequate renal perfusion and functioning will improve Outcome: Progressing   Problem: Activity: Goal: Ability to tolerate increased activity will improve Outcome: Progressing   Problem: Respiratory: Goal: Ability to maintain a clear airway and adequate ventilation will improve Outcome: Progressing   Problem: Role Relationship: Goal: Method of communication will improve Outcome: Progressing   

## 2022-05-24 NOTE — Progress Notes (Signed)
[facility] for Infectious Disease    Date of Admission:  02/25/2022    ID: Reginald Clarke is a 31 y.o. male with  MRSA/MSSA endocarditis with TV annuloplasty with 66mm MC 3 ring on 9/19 Principal Problem:   Endocarditis of tricuspid valve Active Problems:   MRSA bacteremia   Chronic pain syndrome/methadone abuse    Tobacco abuse   Substance use disorder   Hepatitis C, chronic ([name])   Anxiety   Normocytic anemia   Prolonged QT interval   Sepsis ([name])   Elevated serum creatinine   Hypokalemia   Hypomagnesemia   Hyponatremia   Acute bacterial endocarditis   MSSA bacteremia   IVDU (intravenous drug user)   Pneumonia of both lower lobes due to methicillin resistant Staphylococcus aureus (MRSA) ([place])   Thrombocytopenia ([place])   Encounter for preoperative dental examination   [place]   Retained tooth root   Chronic apical periodontitis   Chronic periodontitis   Accretions on teeth   Impacted third molar tooth   Teeth missing   Phobia of dental procedure   Shock ([place])   Acute respiratory failure ([name])   Pleural effusion    Subjective: Afebrile, no pain at chest nor picc line site  Medications:   Ferrous Fumarate  1 tablet Oral BID WC   And   vitamin B-12  100 mcg Oral BID WC   And   ascorbic acid  500 mg Oral BID WC   And   folic acid  1 mg Oral BID WC   aspirin EC  81 mg Oral Daily   bisacodyl  10 mg Oral Daily   Or   bisacodyl  10 mg Rectal Daily   Chlorhexidine Gluconate Cloth  6 each Topical Daily   docusate sodium  200 mg Oral BID   escitalopram  10 mg Oral Daily   feeding supplement  237 mL Oral BID BM   gabapentin  300 mg Oral TID   methadone  95 mg Oral Daily   Followed by   Derrill Memo ON 05/26/2022] methadone  87.5 mg Oral Daily   Followed by   Derrill Memo ON 05/29/2022] methadone  82.5 mg Oral Daily   Followed by   Derrill Memo ON 06/01/2022] methadone  75 mg Oral Daily   multivitamin with minerals  1 tablet Oral Daily   nicotine  14 mg Transdermal Daily    pantoprazole  40 mg Oral Daily   warfarin  6 mg Oral Once per day on Tue Wed   And   warfarin  4 mg Oral Once per day on Sun Mon Thu Fri Sat   Warfarin - Pharmacist Dosing Inpatient   Does not apply q1600    Objective: Vital signs in last 24 hours: Temp:  [98 F (36.7 C)-98.8 F (37.1 C)] 98.1 F (36.7 C) (10/20 1248) Pulse Rate:  [63-96] 96 (10/20 1248) Resp:  [14-18] 14 (10/20 0900) BP: (93-110)/(53-69) 96/53 (10/20 1248) SpO2:  [99 %] 99 % (10/20 0900) Weight:  [86.1 kg] 86.1 kg (10/20 0414)  Physical Exam  Constitutional: He is oriented to person, place, and time. He appears well-developed and well-nourished. No distress.  HENT:  Mouth/Throat: Oropharynx is clear and moist. No oropharyngeal exudate.  Cardiovascular: Normal rate, regular rhythm and normal heart sounds. Exam reveals no gallop and no friction rub.  No murmur heard.  Chest wall: surgical incision well healed Pulmonary/Chest: Effort normal and breath sounds normal. No respiratory distress. He has no wheezes.  Abdominal: Soft. Bowel sounds  are normal. He exhibits no distension. There is no tenderness.  Ext: right arm picc line is c/d/i Neurological: He is alert and oriented to person, place, and time.  Skin: Skin is warm and dry. No rash noted. No erythema.  Psychiatric: He has a normal mood and affect. His behavior is normal.    Lab Results   Microbiology: reviewed Studies/Results: No results found.   Assessment/Plan: MRSA/MSSA TV endocarditis = continue on ceftaroline/daptomycin through oct 31st being the last day to complete 6 wk course of therapy  Deconditioning = would benefit from ambulation in the hall way and possibly permission to go outside with family  [facility] Pager: [phone]  05/24/2022, 2:13 PM

## 2022-05-24 NOTE — Progress Notes (Signed)
[facility]  PROGRESS NOTE  Reginald Clarke KDX:[id number] DOB: 12-14-1990 DOA: 02/25/2022 PCP: Bary Leriche, PA-C   Brief HPI:    Brief Summary: Patient is a 31 y.o.  male with history of recurrent MSSA native tricuspid valve endocarditis in spite of completing several courses of IV antibiotics.  Evaluated by ID/CT surgery-underwent tricuspid valve debridement/repair/annuloplasty on 9/19.   Significant events: 4/12-5/10>> hospitalization for MSSA bacteremia with native TV endocarditis-angio vac procedure on 4/26.Completed cefazolin on 5/10-planned weekly dalbavancin at the ID clinic.   5/22>> outpatient blood culture positive for MSSA 5/24-6/8>> hospitalization [facility] for persistent bacteremia-worsening back pain-discharge to [facility] on IV Ancef. 7/5>> discharged from [facility] on oral cefdinir.  Continued to have intermittent fever postdischarge.   7/24>> admit for-persistent fever/nausea/vomiting/diarrhea-blood cultures positive for MSSA. 9/5>> recurrent fever- blood cultures growing Staph aureus  9/5 >> PICC line discontinued 9/19>> debridement of tricuspid valve/valve reconstruction/annuloplasty 9/27>> transfer to [facility]   Significant studies: 7/25>> CT chest: Multifocal airspace opacities-increasing cavitary nodules 7/25>> MRI thoracic spine: No evidence of infection in thoracic spine.   Significant microbiology data: 7/24>> urine culture: Negative 7/25>> blood culture: 1/2-MSSA. 7/27>> blood culture: No growth 7/29>> blood culture: No growth 9/04>> blood cultures:MRSA 9/07>> blood cultures:neg 9/19>> tricuspid valve tissue culture: Staph aureus-sensitivities pending   Procedures: 8/3>> TEE: 3 vegetations on tricuspid valve-severe TR.  EF 60-65% 9/14>> multiple teeth extraction 9/19>> debridement of tricuspid valve/valve reconstruction/annuloplasty   Consults: ID CT surgery Dentist      Subjective   Patient seen and examined, denies any complaints.    Assessment/Plan:    Sepsis due to recurrent MSSA bacteremia with native tricuspid valve endocarditis-s/p valve repair/annuloplasty on 04/13/22  -Sepsis physiology has resolved, infectious disease was consulted -Recommend 6 weeks of antibiotics Teflaro/daptomycin, stop date 06/04/2022 -This will be done as inpatient -Patient is on Coumadin; pharmacy managing Coumadin dosing -Per CT surgery he will need Coumadin for 3 months from valve repair   Normocytic anemia -Secondary to anemia of chronic disease from recurrent endocarditis -Transfuse for hemoglobin less than 7 -Hemoglobin stable at 9.5   Chronic methadone use  - Requesting that we slowly taper down his methadone dosage-decreased to 130 mg today-we will continue to taper down over the next few days.   Anxiety  -Stable -Continue Lexapro   Methamphetamine use   -In remission    Chronic HCV    - outpatient follow-up with ID.   Mild transaminitis - resolved   Nutrition Status: Nutrition Problem: Inadequate oral intake Etiology: vomiting, nausea Signs/Symptoms: per patient/family report Interventions: Ensure Enlive (each supplement provides 350kcal and 20 grams of protein), MVI, Snacks   BMI: Estimated body mass index is 27.24 kg/m as calculated from the following:   Height as of this encounter: 5\' 10"  (1.778 m).   Weight as of this encounter: 86.1 kg.        Medications     Ferrous Fumarate  1 tablet Oral BID WC   And   vitamin B-12  100 mcg Oral BID WC   And   ascorbic acid  500 mg Oral BID WC   And   folic acid  1 mg Oral BID WC   aspirin EC  81 mg Oral Daily   bisacodyl  10 mg Oral Daily   Or   bisacodyl  10 mg Rectal Daily   Chlorhexidine Gluconate Cloth  6 each Topical Daily   docusate sodium  200 mg Oral BID   escitalopram  10 mg Oral Daily   feeding  supplement  237 mL Oral BID BM   gabapentin  300 mg Oral TID   methadone  95 mg Oral Daily   Followed by   Melene Muller ON 05/26/2022] methadone  87.5 mg  Oral Daily   Followed by   Melene Muller ON 05/29/2022] methadone  82.5 mg Oral Daily   Followed by   Melene Muller ON 06/01/2022] methadone  75 mg Oral Daily   multivitamin with minerals  1 tablet Oral Daily   nicotine  14 mg Transdermal Daily   pantoprazole  40 mg Oral Daily   warfarin  6 mg Oral Once per day on Tue Wed   And   warfarin  4 mg Oral Once per day on Sun Mon Thu Fri Sat   Warfarin - Pharmacist Dosing Inpatient   Does not apply q1600     Data Reviewed:   CBG:  No results for input(s): "GLUCAP" in the last 168 hours.  SpO2: 99 % O2 Flow Rate (L/min): 2 L/min FiO2 (%): 28 %    Vitals:   05/24/22 0414 05/24/22 0900 05/24/22 1248 05/24/22 1716  BP:  110/65 (!) 96/53 (!) 114/59  Pulse:  68 96 67  Resp:  14    Temp:  98.8 F (37.1 C) 98.1 F (36.7 C) 97.7 F (36.5 C)  TempSrc:  Oral Oral Oral  SpO2:  99%    Weight: 86.1 kg     Height:          Data Reviewed:  Basic Metabolic Panel: Recent Labs  Lab 05/20/22 0342  NA 139  K 4.0  CL 102  CO2 28  GLUCOSE 90  BUN 18  CREATININE 1.10  CALCIUM 9.5  MG 1.9    CBC: Recent Labs  Lab 05/20/22 0342  WBC 4.5  NEUTROABS 2.1  HGB 9.5*  HCT 30.2*  MCV 84.4  PLT 237    LFT Recent Labs  Lab 05/20/22 0342  AST 24  ALT 22  ALKPHOS 78  BILITOT 0.2*  PROT 6.6  ALBUMIN 2.9*     Antibiotics: Anti-infectives (From admission, onward)    Start     Dose/Rate Route Frequency Ordered Stop   05/03/22 2000  DAPTOmycin (CUBICIN) 800 mg in sodium chloride 0.9 % IVPB        800 mg 132 mL/hr over 30 Minutes Intravenous Daily 05/03/22 0846 06/05/22 1959   05/03/22 1400  ceftaroline (TEFLARO) 600 mg in sodium chloride 0.9 % 100 mL IVPB        600 mg 100 mL/hr over 60 Minutes Intravenous Every 8 hours 05/03/22 0846 06/05/22 0959   04/26/22 1500  DAPTOmycin (CUBICIN) 800 mg in sodium chloride 0.9 % IVPB  Status:  Discontinued        800 mg 132 mL/hr over 30 Minutes Intravenous Daily 04/26/22 1410 05/03/22 0846    04/25/22 2200  vancomycin (VANCOREADY) IVPB 1250 mg/250 mL  Status:  Discontinued        1,250 mg 166.7 mL/hr over 90 Minutes Intravenous Every 8 hours 04/25/22 1412 04/26/22 1410   04/25/22 1315  vancomycin (VANCOREADY) IVPB 1500 mg/300 mL        1,500 mg 150 mL/hr over 120 Minutes Intravenous  Once 04/25/22 1221 04/25/22 1528   04/23/22 2000  vancomycin (VANCOCIN) IVPB 1000 mg/200 mL premix        1,000 mg 200 mL/hr over 60 Minutes Intravenous  Once 04/23/22 1258 04/23/22 2035   04/23/22 1030  vancomycin (VANCOCIN) 1,000 mg in sodium chloride 0.9 %  1,000 mL irrigation         Irrigation To Surgery 04/23/22 0920 04/24/22 1030   04/23/22 0400  vancomycin (VANCOREADY) IVPB 1250 mg/250 mL        1,250 mg 166.7 mL/hr over 90 Minutes Intravenous To Surgery 04/22/22 1304 04/23/22 0832   04/23/22 0400  ceFAZolin (ANCEF) IVPB 2g/100 mL premix        2 g 200 mL/hr over 30 Minutes Intravenous To Surgery 04/22/22 1304 04/23/22 1147   04/23/22 0400  ceFAZolin (ANCEF) IVPB 2g/100 mL premix  Status:  Discontinued        2 g 200 mL/hr over 30 Minutes Intravenous To Surgery 04/22/22 1304 04/23/22 1258   04/19/22 0600  ceFAZolin (ANCEF) IVPB 2g/100 mL premix        2 g 200 mL/hr over 30 Minutes Intravenous On call to O.R. 04/18/22 1425 04/18/22 1542   04/18/22 1429  ceFAZolin (ANCEF) 2-4 GM/100ML-% IVPB       Note to Pharmacy: Phebe Colla N: cabinet override      04/18/22 1429 04/18/22 1540   04/12/22 2000  DAPTOmycin (CUBICIN) 650 mg in sodium chloride 0.9 % IVPB  Status:  Discontinued        8 mg/kg  78.2 kg 126 mL/hr over 30 Minutes Intravenous Daily 04/12/22 1118 04/25/22 1206   04/12/22 1400  ceftaroline (TEFLARO) 600 mg in sodium chloride 0.9 % 100 mL IVPB  Status:  Discontinued        600 mg 100 mL/hr over 60 Minutes Intravenous Every 8 hours 04/12/22 0938 05/03/22 0846   04/10/22 2000  DAPTOmycin (CUBICIN) 650 mg in sodium chloride 0.9 % IVPB  Status:  Discontinued        10 mg/kg   63.6 kg (Adjusted) 126 mL/hr over 30 Minutes Intravenous Daily 04/10/22 1053 04/10/22 1104   04/10/22 2000  DAPTOmycin (CUBICIN) 500 mg in sodium chloride 0.9 % IVPB  Status:  Discontinued        8 mg/kg  63.6 kg (Adjusted) 120 mL/hr over 30 Minutes Intravenous Daily 04/10/22 1104 04/10/22 1110   04/10/22 1400  DAPTOmycin (CUBICIN) 500 mg in sodium chloride 0.9 % IVPB  Status:  Discontinued        8 mg/kg  63.6 kg (Adjusted) 120 mL/hr over 30 Minutes Intravenous Daily 04/10/22 1110 04/12/22 1118   04/09/22 2000  DAPTOmycin (CUBICIN) 750 mg in sodium chloride 0.9 % IVPB  Status:  Discontinued        10 mg/kg  74 kg 130 mL/hr over 30 Minutes Intravenous Daily 04/09/22 1340 04/09/22 2134   03/12/22 1500  doxycycline (VIBRA-TABS) tablet 100 mg        100 mg Oral 2 times daily 03/12/22 1412 03/19/22 0852   03/09/22 2000  DAPTOmycin (CUBICIN) 750 mg in sodium chloride 0.9 % IVPB  Status:  Discontinued        10 mg/kg  74 kg 130 mL/hr over 30 Minutes Intravenous Daily 03/09/22 1036 04/09/22 1340   02/28/22 1245  DAPTOmycin (CUBICIN) 600 mg in sodium chloride 0.9 % IVPB  Status:  Discontinued        8 mg/kg  74 kg 124 mL/hr over 30 Minutes Intravenous Daily 02/28/22 1145 03/09/22 1036   02/25/22 1745  ceFAZolin (ANCEF) IVPB 2g/100 mL premix  Status:  Discontinued        2 g 200 mL/hr over 30 Minutes Intravenous Every 8 hours 02/25/22 1734 03/09/22 1036        DVT prophylaxis: Coumadin  Code Status: Full code  Family Communication: No family at bedside   CONSULTS infectious disease   Objective    Physical Examination:   General-appears in no acute distress Heart-S1-S2, regular, no murmur auscultated Lungs-clear to auscultation bilaterally, no wheezing or crackles auscultated Abdomen-soft, nontender, no organomegaly Extremities-no edema in the lower extremities Neuro-alert, oriented x3, no focal deficit noted  Status is: Inpatient:          Oswald Hillock   [facility] If 7PM-7AM, please contact night-coverage at [email or web address], Office  [phone]   05/24/2022, 5:53 PM  LOS: 88 days

## 2022-05-25 DIAGNOSIS — G894 Chronic pain syndrome: Secondary | ICD-10-CM | POA: Diagnosis not present

## 2022-05-25 DIAGNOSIS — B182 Chronic viral hepatitis C: Secondary | ICD-10-CM | POA: Diagnosis not present

## 2022-05-25 DIAGNOSIS — J9600 Acute respiratory failure, unspecified whether with hypoxia or hypercapnia: Secondary | ICD-10-CM | POA: Diagnosis not present

## 2022-05-25 DIAGNOSIS — I079 Rheumatic tricuspid valve disease, unspecified: Secondary | ICD-10-CM | POA: Diagnosis not present

## 2022-05-25 DIAGNOSIS — E871 Hypo-osmolality and hyponatremia: Secondary | ICD-10-CM

## 2022-05-25 NOTE — Plan of Care (Signed)
  Problem: Education: Goal: Knowledge of General Education information will improve Description: Including pain rating scale, medication(s)/side effects and non-pharmacologic comfort measures Outcome: Progressing   Problem: Health Behavior/Discharge Planning: Goal: Ability to manage health-related needs will improve Outcome: Progressing   Problem: Clinical Measurements: Goal: Ability to maintain clinical measurements within normal limits will improve Outcome: Progressing Goal: Will remain free from infection Outcome: Progressing Goal: Diagnostic test results will improve Outcome: Progressing Goal: Respiratory complications will improve Outcome: Progressing Goal: Cardiovascular complication will be avoided Outcome: Progressing   Problem: Activity: Goal: Risk for activity intolerance will decrease Outcome: Progressing   Problem: Nutrition: Goal: Adequate nutrition will be maintained Outcome: Progressing   Problem: Coping: Goal: Level of anxiety will decrease Outcome: Progressing   Problem: Elimination: Goal: Will not experience complications related to bowel motility Outcome: Progressing Goal: Will not experience complications related to urinary retention Outcome: Progressing   Problem: Pain Managment: Goal: General experience of comfort will improve Outcome: Progressing   Problem: Safety: Goal: Ability to remain free from injury will improve Outcome: Progressing   Problem: Skin Integrity: Goal: Risk for impaired skin integrity will decrease Outcome: Progressing   Problem: Education: Goal: Will demonstrate proper wound care and an understanding of methods to prevent future damage Outcome: Progressing Goal: Knowledge of disease or condition will improve Outcome: Progressing Goal: Knowledge of the prescribed therapeutic regimen will improve Outcome: Progressing Goal: Individualized Educational Video(s) Outcome: Progressing   Problem: Activity: Goal: Risk for  activity intolerance will decrease Outcome: Progressing   Problem: Cardiac: Goal: Will achieve and/or maintain hemodynamic stability Outcome: Progressing   Problem: Clinical Measurements: Goal: Postoperative complications will be avoided or minimized Outcome: Progressing   Problem: Respiratory: Goal: Respiratory status will improve Outcome: Progressing   Problem: Skin Integrity: Goal: Wound healing without signs and symptoms of infection Outcome: Progressing Goal: Risk for impaired skin integrity will decrease Outcome: Progressing   Problem: Urinary Elimination: Goal: Ability to achieve and maintain adequate renal perfusion and functioning will improve Outcome: Progressing   Problem: Activity: Goal: Ability to tolerate increased activity will improve Outcome: Progressing   Problem: Respiratory: Goal: Ability to maintain a clear airway and adequate ventilation will improve Outcome: Progressing   Problem: Role Relationship: Goal: Method of communication will improve Outcome: Progressing   

## 2022-05-25 NOTE — Progress Notes (Signed)
[facility]  PROGRESS NOTE  Reginald Clarke KPV:[id number] DOB: Feb 08, 1991 DOA: 02/25/2022 PCP: Bary Leriche, PA-C   Brief HPI:    Brief Summary: Patient is a 31 y.o.  male with history of recurrent MSSA native tricuspid valve endocarditis in spite of completing several courses of IV antibiotics.  Evaluated by ID/CT surgery-underwent tricuspid valve debridement/repair/annuloplasty on 9/19.   Significant events: 4/12-5/10>> hospitalization for MSSA bacteremia with native TV endocarditis-angio vac procedure on 4/26.Completed cefazolin on 5/10-planned weekly dalbavancin at the ID clinic.   5/22>> outpatient blood culture positive for MSSA 5/24-6/8>> hospitalization [facility] for persistent bacteremia-worsening back pain-discharge to [facility] on IV Ancef. 7/5>> discharged from [facility] on oral cefdinir.  Continued to have intermittent fever postdischarge.   7/24>> admit for-persistent fever/nausea/vomiting/diarrhea-blood cultures positive for MSSA. 9/5>> recurrent fever- blood cultures growing Staph aureus  9/5 >> PICC line discontinued 9/19>> debridement of tricuspid valve/valve reconstruction/annuloplasty 9/27>> transfer to [facility]   Significant studies: 7/25>> CT chest: Multifocal airspace opacities-increasing cavitary nodules 7/25>> MRI thoracic spine: No evidence of infection in thoracic spine.   Significant microbiology data: 7/24>> urine culture: Negative 7/25>> blood culture: 1/2-MSSA. 7/27>> blood culture: No growth 7/29>> blood culture: No growth 9/04>> blood cultures:MRSA 9/07>> blood cultures:neg 9/19>> tricuspid valve tissue culture: Staph aureus-sensitivities pending   Procedures: 8/3>> TEE: 3 vegetations on tricuspid valve-severe TR.  EF 60-65% 9/14>> multiple teeth extraction 9/19>> debridement of tricuspid valve/valve reconstruction/annuloplasty   Consults: ID CT surgery Dentist      Subjective   Patient seen and examined, no new complaints.    Assessment/Plan:    Sepsis due to recurrent MSSA bacteremia with native tricuspid valve endocarditis-s/p valve repair/annuloplasty on 04/13/22  -Sepsis physiology has resolved, infectious disease was consulted -Recommend 6 weeks of antibiotics Teflaro/daptomycin, stop date 06/04/2022 -This will be done as inpatient -Patient is on Coumadin; pharmacy managing Coumadin dosing -Per CT surgery he will need Coumadin for 3 months from valve repair   Normocytic anemia -Secondary to anemia of chronic disease from recurrent endocarditis -Transfuse for hemoglobin less than 7 -Hemoglobin stable at 9.5   Chronic methadone use - Requesting that we slowly taper down his methadone dosage-decreased to 130 mg today -we will continue to taper down over the next few days.   Anxiety  -Stable -Continue Lexapro   Methamphetamine use   -In remission    Chronic HCV    - outpatient follow-up with ID.   Mild transaminitis - resolved   Nutrition Status: Nutrition Problem: Inadequate oral intake Etiology: vomiting, nausea Signs/Symptoms: per patient/family report Interventions: Ensure Enlive (each supplement provides 350kcal and 20 grams of protein), MVI, Snacks   BMI: Estimated body mass index is 27.24 kg/m as calculated from the following:   Height as of this encounter: 5\' 10"  (1.778 m).   Weight as of this encounter: 86.1 kg.        Medications     Ferrous Fumarate  1 tablet Oral BID WC   And   vitamin B-12  100 mcg Oral BID WC   And   ascorbic acid  500 mg Oral BID WC   And   folic acid  1 mg Oral BID WC   aspirin EC  81 mg Oral Daily   bisacodyl  10 mg Oral Daily   Or   bisacodyl  10 mg Rectal Daily   Chlorhexidine Gluconate Cloth  6 each Topical Daily   docusate sodium  200 mg Oral BID   escitalopram  10 mg Oral Daily   feeding  supplement  237 mL Oral BID BM   gabapentin  300 mg Oral TID   [START ON 05/26/2022] methadone  87.5 mg Oral Daily   Followed by   Melene Muller ON  05/29/2022] methadone  82.5 mg Oral Daily   Followed by   Melene Muller ON 06/01/2022] methadone  75 mg Oral Daily   multivitamin with minerals  1 tablet Oral Daily   nicotine  14 mg Transdermal Daily   pantoprazole  40 mg Oral Daily   warfarin  6 mg Oral Once per day on Tue Wed   And   warfarin  4 mg Oral Once per day on Sun Mon Thu Fri Sat   Warfarin - Pharmacist Dosing Inpatient   Does not apply q1600     Data Reviewed:   CBG:  No results for input(s): "GLUCAP" in the last 168 hours.  SpO2: 97 % O2 Flow Rate (L/min): 2 L/min FiO2 (%): 28 %    Vitals:   05/24/22 2000 05/25/22 0017 05/25/22 0400 05/25/22 0838  BP: 115/60 99/61 111/67 (!) 109/58  Pulse: 78 67 71 70  Resp: 16 18 16 18   Temp: 98.4 F (36.9 C) 98 F (36.7 C) 98.4 F (36.9 C) (!) 97.5 F (36.4 C)  TempSrc: Oral Oral Oral Oral  SpO2: 100% 99% 99% 97%  Weight:      Height:          Data Reviewed:  Basic Metabolic Panel: Recent Labs  Lab 05/20/22 0342  NA 139  K 4.0  CL 102  CO2 28  GLUCOSE 90  BUN 18  CREATININE 1.10  CALCIUM 9.5  MG 1.9    CBC: Recent Labs  Lab 05/20/22 0342  WBC 4.5  NEUTROABS 2.1  HGB 9.5*  HCT 30.2*  MCV 84.4  PLT 237    LFT Recent Labs  Lab 05/20/22 0342  AST 24  ALT 22  ALKPHOS 78  BILITOT 0.2*  PROT 6.6  ALBUMIN 2.9*     Antibiotics: Anti-infectives (From admission, onward)    Start     Dose/Rate Route Frequency Ordered Stop   05/03/22 2000  DAPTOmycin (CUBICIN) 800 mg in sodium chloride 0.9 % IVPB        800 mg 132 mL/hr over 30 Minutes Intravenous Daily 05/03/22 0846 06/05/22 1959   05/03/22 1400  ceftaroline (TEFLARO) 600 mg in sodium chloride 0.9 % 100 mL IVPB        600 mg 100 mL/hr over 60 Minutes Intravenous Every 8 hours 05/03/22 0846 06/05/22 0959   04/26/22 1500  DAPTOmycin (CUBICIN) 800 mg in sodium chloride 0.9 % IVPB  Status:  Discontinued        800 mg 132 mL/hr over 30 Minutes Intravenous Daily 04/26/22 1410 05/03/22 0846    04/25/22 2200  vancomycin (VANCOREADY) IVPB 1250 mg/250 mL  Status:  Discontinued        1,250 mg 166.7 mL/hr over 90 Minutes Intravenous Every 8 hours 04/25/22 1412 04/26/22 1410   04/25/22 1315  vancomycin (VANCOREADY) IVPB 1500 mg/300 mL        1,500 mg 150 mL/hr over 120 Minutes Intravenous  Once 04/25/22 1221 04/25/22 1528   04/23/22 2000  vancomycin (VANCOCIN) IVPB 1000 mg/200 mL premix        1,000 mg 200 mL/hr over 60 Minutes Intravenous  Once 04/23/22 1258 04/23/22 2035   04/23/22 1030  vancomycin (VANCOCIN) 1,000 mg in sodium chloride 0.9 % 1,000 mL irrigation  Irrigation To Surgery 04/23/22 0920 04/24/22 1030   04/23/22 0400  vancomycin (VANCOREADY) IVPB 1250 mg/250 mL        1,250 mg 166.7 mL/hr over 90 Minutes Intravenous To Surgery 04/22/22 1304 04/23/22 0832   04/23/22 0400  ceFAZolin (ANCEF) IVPB 2g/100 mL premix        2 g 200 mL/hr over 30 Minutes Intravenous To Surgery 04/22/22 1304 04/23/22 1147   04/23/22 0400  ceFAZolin (ANCEF) IVPB 2g/100 mL premix  Status:  Discontinued        2 g 200 mL/hr over 30 Minutes Intravenous To Surgery 04/22/22 1304 04/23/22 1258   04/19/22 0600  ceFAZolin (ANCEF) IVPB 2g/100 mL premix        2 g 200 mL/hr over 30 Minutes Intravenous On call to O.R. 04/18/22 1425 04/18/22 1542   04/18/22 1429  ceFAZolin (ANCEF) 2-4 GM/100ML-% IVPB       Note to Pharmacy: Phebe Colla N: cabinet override      04/18/22 1429 04/18/22 1540   04/12/22 2000  DAPTOmycin (CUBICIN) 650 mg in sodium chloride 0.9 % IVPB  Status:  Discontinued        8 mg/kg  78.2 kg 126 mL/hr over 30 Minutes Intravenous Daily 04/12/22 1118 04/25/22 1206   04/12/22 1400  ceftaroline (TEFLARO) 600 mg in sodium chloride 0.9 % 100 mL IVPB  Status:  Discontinued        600 mg 100 mL/hr over 60 Minutes Intravenous Every 8 hours 04/12/22 0938 05/03/22 0846   04/10/22 2000  DAPTOmycin (CUBICIN) 650 mg in sodium chloride 0.9 % IVPB  Status:  Discontinued        10 mg/kg   63.6 kg (Adjusted) 126 mL/hr over 30 Minutes Intravenous Daily 04/10/22 1053 04/10/22 1104   04/10/22 2000  DAPTOmycin (CUBICIN) 500 mg in sodium chloride 0.9 % IVPB  Status:  Discontinued        8 mg/kg  63.6 kg (Adjusted) 120 mL/hr over 30 Minutes Intravenous Daily 04/10/22 1104 04/10/22 1110   04/10/22 1400  DAPTOmycin (CUBICIN) 500 mg in sodium chloride 0.9 % IVPB  Status:  Discontinued        8 mg/kg  63.6 kg (Adjusted) 120 mL/hr over 30 Minutes Intravenous Daily 04/10/22 1110 04/12/22 1118   04/09/22 2000  DAPTOmycin (CUBICIN) 750 mg in sodium chloride 0.9 % IVPB  Status:  Discontinued        10 mg/kg  74 kg 130 mL/hr over 30 Minutes Intravenous Daily 04/09/22 1340 04/09/22 2134   03/12/22 1500  doxycycline (VIBRA-TABS) tablet 100 mg        100 mg Oral 2 times daily 03/12/22 1412 03/19/22 0852   03/09/22 2000  DAPTOmycin (CUBICIN) 750 mg in sodium chloride 0.9 % IVPB  Status:  Discontinued        10 mg/kg  74 kg 130 mL/hr over 30 Minutes Intravenous Daily 03/09/22 1036 04/09/22 1340   02/28/22 1245  DAPTOmycin (CUBICIN) 600 mg in sodium chloride 0.9 % IVPB  Status:  Discontinued        8 mg/kg  74 kg 124 mL/hr over 30 Minutes Intravenous Daily 02/28/22 1145 03/09/22 1036   02/25/22 1745  ceFAZolin (ANCEF) IVPB 2g/100 mL premix  Status:  Discontinued        2 g 200 mL/hr over 30 Minutes Intravenous Every 8 hours 02/25/22 1734 03/09/22 1036        DVT prophylaxis: Coumadin  Code Status: Full code  Family Communication: No family at  bedside   CONSULTS infectious disease   Objective    Physical Examination:   General-appears in no acute distress Heart-S1-S2, regular, no murmur auscultated Lungs-clear to auscultation bilaterally, no wheezing or crackles auscultated Abdomen-soft, nontender, no organomegaly Extremities-no edema in the lower extremities Neuro-alert, oriented x3, no focal deficit noted  Status is: Inpatient:          Oswald Hillock   [facility] If 7PM-7AM, please contact night-coverage at [email or web address], Office  [phone]   05/25/2022, 3:22 PM  LOS: 89 days

## 2022-05-26 DIAGNOSIS — I079 Rheumatic tricuspid valve disease, unspecified: Secondary | ICD-10-CM | POA: Diagnosis not present

## 2022-05-26 MED ORDER — METHADONE HCL 10 MG PO TABS: 70.0000 mg | ORAL_TABLET | Freq: Every day | ORAL | Status: DC

## 2022-05-26 MED ORDER — METHADONE HCL 10 MG PO TABS: 50.0000 mg | ORAL_TABLET | Freq: Every day | ORAL | Status: DC

## 2022-05-26 MED ORDER — METHADONE HCL 10 MG PO TABS
80.0000 mg | ORAL_TABLET | Freq: Every day | ORAL | Status: DC
  Administered 2022-05-27: 80 mg via ORAL
  Filled 2022-05-26: qty 8

## 2022-05-26 NOTE — Progress Notes (Addendum)
PROGRESS NOTE        PATIENT DETAILS Name: Reginald Clarke Age: 31 y.o. Sex: male Date of Birth: 1990/10/03 Admit Date: 02/25/2022 Admitting Physician Dorcas Carrow, MD [id number], [name], PA-C  Brief Summary: Patient is a 31 y.o.  male with history of recurrent MSSA native tricuspid valve endocarditis in spite of completing several courses of IV antibiotics.  Evaluated by ID/CT surgery-underwent tricuspid valve debridement/repair/annuloplasty on 9/19.  Significant events: 4/12-5/10>> hospitalization for MSSA bacteremia with native TV endocarditis-angio vac procedure on 4/26.Completed cefazolin on 5/10-planned weekly dalbavancin at the ID clinic.   5/22>> outpatient blood culture positive for MSSA 5/24-6/8>> hospitalization [facility] for persistent bacteremia-worsening back pain-discharge to [facility] on IV Ancef. 7/5>> discharged from [facility] on oral cefdinir.  Continued to have intermittent fever postdischarge.   7/24>> admit for-persistent fever/nausea/vomiting/diarrhea-blood cultures positive for MSSA. 9/5>> recurrent fever- blood cultures growing Staph aureus  9/5 >> PICC line discontinued 9/19>> debridement of tricuspid valve/valve reconstruction/annuloplasty 9/27>> transfer to [facility]  Significant studies: 7/25>> CT chest: Multifocal airspace opacities-increasing cavitary nodules 7/25>> MRI thoracic spine: No evidence of infection in thoracic spine.  Significant microbiology data: 7/24>> urine culture: Negative 7/25>> blood culture: 1/2-MSSA. 7/27>> blood culture: No growth 7/29>> blood culture: No growth 9/04>> blood cultures:MRSA 9/07>> blood cultures:neg 9/19>> tricuspid valve tissue culture: Staph aureus-sensitivities pending  Procedures: 8/3>> TEE: 3 vegetations on tricuspid valve-severe TR.  EF 60-65% 9/14>> multiple teeth extraction 9/19>> debridement of tricuspid valve/valve reconstruction/annuloplasty  Consults: ID CT surgery Dentist    Subjective:   Patient in bed, appears comfortable, denies any headache, no fever, no chest pain or pressure, no shortness of breath , no abdominal pain. No new focal weakness.  Objective:  Vitals: Blood pressure (!) 100/47, pulse 76, temperature 98 F (36.7 C), temperature source Oral, resp. rate 15, height 5\' 10"  (1.778 m), weight 85.9 kg, SpO2 100 %.   Exam:  Awake Alert, No new F.N deficits, Normal affect [place].AT,PERRAL Supple Neck, No JVD,   Symmetrical Chest wall movement, Good air movement bilaterally, CTAB RRR,No Gallops, Rubs or new Murmurs,  +ve B.Sounds, Abd Soft, No tenderness,   No Cyanosis, Clubbing or edema    Assessment/Plan:  Sepsis due to recurrent MSSA bacteremia with native tricuspid valve endocarditis-s/p valve repair/annuloplasty on 04/23/22 -  Sepsis physiology resolved-discussed with infectious disease MD on 9/28 Dr. 10/28 - needs 6 weeks of Teflaro/daptomycin stop date 06/04/22 - this will unfortunately have to be done as an inpatient.  Goal INR 2-3-pharmacy now managing Coumadin.  Per CT surgery note-needs Coumadin for 3 months from valve repair.  Stop date 07/23/2022.  Normocytic anemia:  Due to chronic disease/recurrent endocarditis: No indication for transfusion.  Anxiety -  Stable-on Lexapro  Methamphetamine use  In remission  Chronic methadone use - Requesting that we slowly taper down his methadone dosage-continue tapering.  Chronic HCV   - outpatient follow-up with ID.  Mild transaminitis resolved  Nutrition Status: Nutrition Problem: Inadequate oral intake Etiology: vomiting, nausea Signs/Symptoms: per patient/family report Interventions: Ensure Enlive (each supplement provides 350kcal and 20 grams of protein), MVI, Snacks  BMI: Estimated body mass index is 27.16 kg/m as calculated from the following:   Height as of this encounter: 5\' 10"  (1.778 m).   Weight as of this encounter: 85.9 kg.   Code status:   Code Status: Full Code    DVT Prophylaxis: SCDs  Start: 04/24/22 0836 SCDs Start: 04/23/22 1259 warfarin (COUMADIN) tablet 6 mg  warfarin (COUMADIN) tablet 4 mg    Family Communication: None at bedside  Disposition Plan: Status is: Inpatient for 6 weeks of IV antibiotics stop date 06/04/22.   Diet: Diet Order             DIET DYS 3 Room service appropriate? Yes; Fluid consistency: Thin  Diet effective now                    MEDICATIONS: Scheduled Meds:  Ferrous Fumarate  1 tablet Oral BID WC   And   vitamin B-12  100 mcg Oral BID WC   And   ascorbic acid  500 mg Oral BID WC   And   folic acid  1 mg Oral BID WC   aspirin EC  81 mg Oral Daily   bisacodyl  10 mg Oral Daily   Or   bisacodyl  10 mg Rectal Daily   Chlorhexidine Gluconate Cloth  6 each Topical Daily   docusate sodium  200 mg Oral BID   escitalopram  10 mg Oral Daily   feeding supplement  237 mL Oral BID BM   gabapentin  300 mg Oral TID   methadone  87.5 mg Oral Daily   Followed by   Derrill Memo ON 05/29/2022] methadone  82.5 mg Oral Daily   Followed by   Derrill Memo ON 06/01/2022] methadone  75 mg Oral Daily   multivitamin with minerals  1 tablet Oral Daily   nicotine  14 mg Transdermal Daily   pantoprazole  40 mg Oral Daily   warfarin  6 mg Oral Once per day on Tue Wed   And   warfarin  4 mg Oral Once per day on Sun Mon Thu Fri Sat   Warfarin - Pharmacist Dosing Inpatient   Does not apply q1600   Continuous Infusions:  ceFTAROline (TEFLARO) IV 600 mg (05/26/22 0918)   DAPTOmycin (CUBICIN) 800 mg in sodium chloride 0.9 % IVPB Stopped (05/25/22 2030)   promethazine (PHENERGAN) injection (IM or IVPB) Stopped (05/20/22 1020)   PRN Meds:.acetaminophen, alum & mag hydroxide-simeth, bisacodyl, diclofenac Sodium, hydrOXYzine, loperamide, methocarbamol, mouth rinse, oxyCODONE, polyethylene glycol, promethazine (PHENERGAN) injection (IM or IVPB), sodium chloride flush   I have personally reviewed following labs and imaging  studies  LABORATORY DATA:  Recent Labs  Lab 05/20/22 0342  WBC 4.5  HGB 9.5*  HCT 30.2*  PLT 237  MCV 84.4  MCH 26.5  MCHC 31.5  RDW 14.6  LYMPHSABS 1.5  MONOABS 0.6  EOSABS 0.4  BASOSABS 0.0    Recent Labs  Lab 05/20/22 0342 05/21/22 0417 05/22/22 0417 05/24/22 0358  NA 139  --   --   --   K 4.0  --   --   --   CL 102  --   --   --   CO2 28  --   --   --   GLUCOSE 90  --   --   --   BUN 18  --   --   --   CREATININE 1.10  --   --   --   CALCIUM 9.5  --   --   --   AST 24  --   --   --   ALT 22  --   --   --   ALKPHOS 78  --   --   --   BILITOT 0.2*  --   --   --  ALBUMIN 2.9*  --   --   --   MG 1.9  --   --   --   INR  --  1.9* 1.9* 2.5*     LOS: 90 days   Signature  Susa Raring M.D on 05/26/2022 at 9:19 AM   -  To page go to [email or web address]

## 2022-05-27 DIAGNOSIS — I079 Rheumatic tricuspid valve disease, unspecified: Secondary | ICD-10-CM | POA: Diagnosis not present

## 2022-05-27 LAB — CBC WITH DIFFERENTIAL/PLATELET
Abs Immature Granulocytes: 0.01 10*3/uL (ref 0.00–0.07)
Basophils Absolute: 0 10*3/uL (ref 0.0–0.1)
Basophils Relative: 1 %
Eosinophils Absolute: 0.4 10*3/uL (ref 0.0–0.5)
Eosinophils Relative: 8 %
HCT: 32.5 % — ABNORMAL LOW (ref 39.0–52.0)
Hemoglobin: 10.2 g/dL — ABNORMAL LOW (ref 13.0–17.0)
Immature Granulocytes: 0 %
Lymphocytes Relative: 33 %
Lymphs Abs: 1.6 10*3/uL (ref 0.7–4.0)
MCH: 26.3 pg (ref 26.0–34.0)
MCHC: 31.4 g/dL (ref 30.0–36.0)
MCV: 83.8 fL (ref 80.0–100.0)
Monocytes Absolute: 0.5 10*3/uL (ref 0.1–1.0)
Monocytes Relative: 10 %
Neutro Abs: 2.3 10*3/uL (ref 1.7–7.7)
Neutrophils Relative %: 48 %
Platelets: 176 10*3/uL (ref 150–400)
RBC: 3.88 MIL/uL — ABNORMAL LOW (ref 4.22–5.81)
RDW: 15 % (ref 11.5–15.5)
WBC: 4.7 10*3/uL (ref 4.0–10.5)
nRBC: 0 % (ref 0.0–0.2)

## 2022-05-27 LAB — COMPREHENSIVE METABOLIC PANEL
ALT: 29 U/L (ref 0–44)
AST: 30 U/L (ref 15–41)
Albumin: 3.2 g/dL — ABNORMAL LOW (ref 3.5–5.0)
Alkaline Phosphatase: 84 U/L (ref 38–126)
Anion gap: 9 (ref 5–15)
BUN: 16 mg/dL (ref 6–20)
CO2: 29 mmol/L (ref 22–32)
Calcium: 9.5 mg/dL (ref 8.9–10.3)
Chloride: 102 mmol/L (ref 98–111)
Creatinine, Ser: 0.79 mg/dL (ref 0.61–1.24)
GFR, Estimated: >60 mL/min (ref >60)
Glucose, Bld: 90 mg/dL (ref 70–99)
Potassium: 4.2 mmol/L (ref 3.5–5.1)
Sodium: 140 mmol/L (ref 135–145)
Total Bilirubin: 0.5 mg/dL (ref 0.3–1.2)
Total Protein: 6.8 g/dL (ref 6.5–8.1)

## 2022-05-27 LAB — FUNGUS CULTURE WITH STAIN

## 2022-05-27 LAB — PROTIME-INR
INR: 2.6 — ABNORMAL HIGH (ref 0.8–1.2)
Prothrombin Time: 28 seconds — ABNORMAL HIGH (ref 11.4–15.2)

## 2022-05-27 LAB — CK: Total CK: 26 U/L — ABNORMAL LOW (ref 49–397)

## 2022-05-27 LAB — FUNGAL ORGANISM REFLEX

## 2022-05-27 LAB — FUNGUS CULTURE RESULT

## 2022-05-27 LAB — MAGNESIUM: Magnesium: 2 mg/dL (ref 1.7–2.4)

## 2022-05-27 MED ORDER — METHADONE HCL 10 MG PO TABS
60.0000 mg | ORAL_TABLET | Freq: Every day | ORAL | Status: DC
  Administered 2022-05-28: 60 mg via ORAL
  Filled 2022-05-27: qty 6

## 2022-05-27 NOTE — Progress Notes (Signed)
PROGRESS NOTE        PATIENT DETAILS Name: Reginald Clarke Age: 31 y.o. Sex: male Date of Birth: [birth year] Admit Date: 02/25/2022 Admitting Physician Dorcas Carrow, MD [id number], [name], PA-C  Brief Summary: Patient is a 31 y.o.  male with history of recurrent MSSA native tricuspid valve endocarditis in spite of completing several courses of IV antibiotics.  Evaluated by ID/CT surgery-underwent tricuspid valve debridement/repair/annuloplasty on 9/19.  Significant events: 4/12-5/10>> hospitalization for MSSA bacteremia with native TV endocarditis-angio vac procedure on 4/26.Completed cefazolin on 5/10-planned weekly dalbavancin at the ID clinic.   5/22>> outpatient blood culture positive for MSSA 5/24-6/8>> hospitalization [facility] for persistent bacteremia-worsening back pain-discharge to [facility] on IV Ancef. 7/5>> discharged from [facility] on oral cefdinir.  Continued to have intermittent fever postdischarge.   7/24>> admit for-persistent fever/nausea/vomiting/diarrhea-blood cultures positive for MSSA. 9/5>> recurrent fever- blood cultures growing Staph aureus  9/5 >> PICC line discontinued 9/19>> debridement of tricuspid valve/valve reconstruction/annuloplasty 9/27>> transfer to [facility]  Significant studies: 7/25>> CT chest: Multifocal airspace opacities-increasing cavitary nodules 7/25>> MRI thoracic spine: No evidence of infection in thoracic spine.  Significant microbiology data: 7/24>> urine culture: Negative 7/25>> blood culture: 1/2-MSSA. 7/27>> blood culture: No growth 7/29>> blood culture: No growth 9/04>> blood cultures:MRSA 9/07>> blood cultures:neg 9/19>> tricuspid valve tissue culture: Staph aureus-sensitivities pending  Procedures: 8/3>> TEE: 3 vegetations on tricuspid valve-severe TR.  EF 60-65% 9/14>> multiple teeth extraction 9/19>> debridement of tricuspid valve/valve reconstruction/annuloplasty  Consults: ID CT surgery Dentist    Subjective:   Patient in bed, appears comfortable, denies any headache, no fever, no chest pain or pressure, no shortness of breath , no abdominal pain. No new focal weakness.  He feels comfortable.   Objective:  Vitals: Blood pressure 97/61, pulse 80, temperature 98.2 F (36.8 C), temperature source Oral, resp. rate 18, height 5\' 10"  (1.778 m), weight 88.8 kg, SpO2 98 %.   Exam:  Awake Alert, No new F.N deficits, Normal affect [place].AT,PERRAL Supple Neck, No JVD,   Symmetrical Chest wall movement, Good air movement bilaterally, CTAB RRR,No Gallops, Rubs or new Murmurs,  +ve B.Sounds, Abd Soft, No tenderness,   No Cyanosis, Clubbing or edema    Assessment/Plan:  Sepsis due to recurrent MSSA bacteremia with native tricuspid valve endocarditis-s/p valve repair/annuloplasty on 04/23/22 -  Sepsis physiology resolved-discussed with infectious disease MD on 9/28 Dr. 10/28 - needs 6 weeks of Teflaro/daptomycin stop date 06/04/22 - this will unfortunately have to be done as an inpatient.  Goal INR 2-3-pharmacy now managing Coumadin.  Per CT surgery note-needs Coumadin for 3 months from valve repair.  Stop date 07/23/2022.  Normocytic anemia:  Due to chronic disease/recurrent endocarditis: No indication for transfusion.  Anxiety -  Stable-on Lexapro  Methamphetamine use  In remission  Chronic methadone use - Requesting that we slowly taper down his methadone dosage-continue tapering.  Chronic HCV   - outpatient follow-up with ID.  Mild transaminitis resolved  Nutrition Status: Nutrition Problem: Inadequate oral intake Etiology: vomiting, nausea Signs/Symptoms: per patient/family report Interventions: Ensure Enlive (each supplement provides 350kcal and 20 grams of protein), MVI, Snacks  BMI: Estimated body mass index is 28.09 kg/m as calculated from the following:   Height as of this encounter: 5\' 10"  (1.778 m).   Weight as of this encounter: 88.8 kg.   Code status:   Code  Status: Full Code  DVT Prophylaxis: SCDs Start: 04/24/22 0836 SCDs Start: 04/23/22 1259 warfarin (COUMADIN) tablet 6 mg  warfarin (COUMADIN) tablet 4 mg    Family Communication: None at bedside  Disposition Plan: Status is: Inpatient for 6 weeks of IV antibiotics stop date 06/04/22.   Diet: Diet Order             DIET DYS 3 Room service appropriate? Yes; Fluid consistency: Thin  Diet effective now                    MEDICATIONS: Scheduled Meds:  Ferrous Fumarate  1 tablet Oral BID WC   And   vitamin B-12  100 mcg Oral BID WC   And   ascorbic acid  500 mg Oral BID WC   And   folic acid  1 mg Oral BID WC   aspirin EC  81 mg Oral Daily   bisacodyl  10 mg Oral Daily   Or   bisacodyl  10 mg Rectal Daily   Chlorhexidine Gluconate Cloth  6 each Topical Daily   docusate sodium  200 mg Oral BID   escitalopram  10 mg Oral Daily   feeding supplement  237 mL Oral BID BM   gabapentin  300 mg Oral TID   methadone  80 mg Oral Daily   Followed by   Derrill Memo ON 05/29/2022] methadone  70 mg Oral Daily   multivitamin with minerals  1 tablet Oral Daily   nicotine  14 mg Transdermal Daily   pantoprazole  40 mg Oral Daily   warfarin  6 mg Oral Once per day on Tue Wed   And   warfarin  4 mg Oral Once per day on Sun Mon Thu Fri Sat   Warfarin - Pharmacist Dosing Inpatient   Does not apply q1600   Continuous Infusions:  ceFTAROline (TEFLARO) IV 600 mg (05/27/22 0908)   DAPTOmycin (CUBICIN) 800 mg in sodium chloride 0.9 % IVPB 800 mg (05/26/22 1927)   promethazine (PHENERGAN) injection (IM or IVPB) Stopped (05/20/22 1020)   PRN Meds:.acetaminophen, alum & mag hydroxide-simeth, bisacodyl, diclofenac Sodium, hydrOXYzine, loperamide, methocarbamol, mouth rinse, oxyCODONE, polyethylene glycol, promethazine (PHENERGAN) injection (IM or IVPB), sodium chloride flush   I have personally reviewed following labs and imaging studies  LABORATORY DATA:  Recent Labs  Lab 05/27/22 0452   WBC 4.7  HGB 10.2*  HCT 32.5*  PLT 176  MCV 83.8  MCH 26.3  MCHC 31.4  RDW 15.0  LYMPHSABS 1.6  MONOABS 0.5  EOSABS 0.4  BASOSABS 0.0    Recent Labs  Lab 05/21/22 0417 05/22/22 0417 05/24/22 0358 05/27/22 0452  NA  --   --   --  140  K  --   --   --  4.2  CL  --   --   --  102  CO2  --   --   --  29  GLUCOSE  --   --   --  90  BUN  --   --   --  16  CREATININE  --   --   --  0.79  CALCIUM  --   --   --  9.5  AST  --   --   --  30  ALT  --   --   --  29  ALKPHOS  --   --   --  84  BILITOT  --   --   --  0.5  ALBUMIN  --   --   --  3.2*  MG  --   --   --  2.0  INR 1.9* 1.9* 2.5* 2.6*     LOS: 91 days   Signature  Susa Raring M.D on 05/27/2022 at 10:11 AM   -  To page go to [email or web address]

## 2022-05-27 NOTE — Progress Notes (Addendum)
[place] for Infectious Disease    Date of Admission:  02/25/2022     ID: Reginald Clarke is a 31 y.o. male with MSSA/MRSA TV endocarditis s/p repair Principal Problem:   Endocarditis of tricuspid valve Active Problems:   MRSA bacteremia   Chronic pain syndrome/methadone abuse    Tobacco abuse   Substance use disorder   Hepatitis C, chronic ([name])   Anxiety   Normocytic anemia   Prolonged QT interval   Sepsis ([name])   Elevated serum creatinine   Hypokalemia   Hypomagnesemia   Hyponatremia   Acute bacterial endocarditis   MSSA bacteremia   IVDU (intravenous drug user)   Pneumonia of both lower lobes due to methicillin resistant Staphylococcus aureus (MRSA) ([place])   Thrombocytopenia ([place])   Encounter for preoperative dental examination   [place]   Retained tooth root   Chronic apical periodontitis   Chronic periodontitis   Accretions on teeth   Impacted third molar tooth   Teeth missing   Phobia of dental procedure   Shock ([place])   Acute respiratory failure ([name])   Pleural effusion    Subjective: Tolerating iv abtx, has 9 more days including today to finish 6 wk course therapy  Medications:   Ferrous Fumarate  1 tablet Oral BID WC   And   vitamin B-12  100 mcg Oral BID WC   And   ascorbic acid  500 mg Oral BID WC   And   folic acid  1 mg Oral BID WC   aspirin EC  81 mg Oral Daily   bisacodyl  10 mg Oral Daily   Or   bisacodyl  10 mg Rectal Daily   Chlorhexidine Gluconate Cloth  6 each Topical Daily   docusate sodium  200 mg Oral BID   escitalopram  10 mg Oral Daily   feeding supplement  237 mL Oral BID BM   gabapentin  300 mg Oral TID   [START ON 05/28/2022] methadone  60 mg Oral Daily   multivitamin with minerals  1 tablet Oral Daily   nicotine  14 mg Transdermal Daily   pantoprazole  40 mg Oral Daily   warfarin  6 mg Oral Once per day on Tue Wed   And   warfarin  4 mg Oral Once per day on Sun Mon Thu Fri Sat   Warfarin - Pharmacist Dosing  Inpatient   Does not apply q1600    Objective: Vital signs in last 24 hours: Temp:  [98.1 F (36.7 C)-98.5 F (36.9 C)] 98.1 F (36.7 C) (10/23 1214) Pulse Rate:  [70-82] 82 (10/23 1214) Resp:  [18] 18 (10/23 1214) BP: (97-114)/(57-61) 98/57 (10/23 1214) SpO2:  [98 %-99 %] 98 % (10/23 0803) Weight:  [88.8 kg] 88.8 kg (10/23 0429) Physical Exam  Constitutional: He is oriented to person, place, and time. He appears well-developed and well-nourished. No distress.  HENT:  Mouth/Throat: Oropharynx is clear and moist. No oropharyngeal exudate.  Cardiovascular: Normal rate, regular rhythm and normal heart sounds. Exam reveals no gallop and no friction rub.  No murmur heard.  Pulmonary/Chest: Effort normal and breath sounds normal. No respiratory distress. He has no wheezes.  Abdominal: Soft. Bowel sounds are normal. He exhibits no distension. There is no tenderness.  Ext: right picc line is c/d/i Neurological: He is alert and oriented to person, place, and time.  Skin: Skin is warm and dry. No rash noted. No erythema.  Psychiatric: He has a normal mood and affect.  His behavior is normal.   Lab Results Recent Labs    05/27/22 0452  WBC 4.7  HGB 10.2*  HCT 32.5*  NA 140  K 4.2  CL 102  CO2 29  BUN 16  CREATININE 0.79   Liver Panel Recent Labs    05/27/22 0452  PROT 6.8  ALBUMIN 3.2*  AST 30  ALT 29  ALKPHOS 84  BILITOT 0.5     Microbiology: 9/19 tissue valve -MSSA Studies/Results: No results found.   Assessment/Plan: Hx of MSSA/MRSA (with VISA/DRSA) TV endocarditis = continue with ceftaroline and daptomycin thorugh 06/04/2022. Continue will weekly ck monitoring.  Deconditioning = continue to recommend ambulation 2-3x per day in the unit.  Will check in at the end of the week.  [facility] for Infectious Diseases Pager: [phone]  05/27/2022, 2:24 PM

## 2022-05-27 NOTE — Progress Notes (Signed)
[place] for Warfarin Indication:  tricuspid valve repair  No Known Allergies  Patient Measurements: Height: 5\' 10"  (177.8 cm) Weight: 88.8 kg (195 lb 12.3 oz) IBW/kg (Calculated) : 73  Vital Signs: Temp: 98.5 F (36.9 C) (10/23 0348) Temp Source: Oral (10/23 0348) BP: 97/60 (10/23 0348) Pulse Rate: 70 (10/23 0348)  Labs: Recent Labs    05/27/22 0452  HGB 10.2*  HCT 32.5*  PLT 176  LABPROT 28.0*  INR 2.6*  CREATININE 0.79  CKTOTAL 26*     Estimated Creatinine Clearance: 150.1 mL/min (by C-G formula based on SCr of 0.79 mg/dL).  Assessment: Reginald Clarke is a 31 yr old male s/p tricuspid valve repair on 04/23/22. Warfarin initiated post-op with plan to treat x 3 months per CT surgery.    Patient continues on ceftaroline and daptomycin through 10/31 (broad spectrum antibiotics may lead to increased INR).  May need higher dose once off IV antibiotics.  INR increased to goal at 2.5 on Warfarin regimen of 6mg  Tuesday and Wednesday and 4mg  daily all other days. Dose adjustment occurred on 10/18 and seeing effects on 10/19. CBC stable (10/16) Hgb 8s-9s stable, pltc wnl stable. No bleeding reported.   10/23 update INR 2.6 (within goal range) No bleeding reported No compliance issues noted  Goal of Therapy:  INR 2-3 Monitor platelets by anticoagulation protocol: Yes   Plan:  Continue Warfarin 6 mg everyTue & Wed and 4mg  every MThFSS   Monitor INR qMWF   Continue to monitor H&H and platelets   Thank you for allowing pharmacy to be a part of this patient's care.   Vaughan Basta BS, PharmD, BCPS Clinical Pharmacist 05/27/2022 7:48 AM  Contact: [phone] after 3 PM  "Be curious, not judgmental..." -Jamal Maes

## 2022-05-28 DIAGNOSIS — I079 Rheumatic tricuspid valve disease, unspecified: Secondary | ICD-10-CM | POA: Diagnosis not present

## 2022-05-28 MED ORDER — METHADONE HCL 5 MG PO TABS
55.0000 mg | ORAL_TABLET | Freq: Every day | ORAL | Status: DC
  Administered 2022-05-29: 55 mg via ORAL
  Filled 2022-05-28: qty 1

## 2022-05-28 NOTE — Progress Notes (Signed)
PROGRESS NOTE        PATIENT DETAILS Name: Reginald Clarke Age: 31 y.o. Sex: male Date of Birth: [birth year] Admit Date: 02/25/2022 Admitting Physician [name], MD CNO:[id number], Randa Evens, PA-C  Brief Summary: Patient is a 31 y.o.  male with history of recurrent MSSA native tricuspid valve endocarditis in spite of completing several courses of IV antibiotics.  Evaluated by ID/CT surgery-underwent tricuspid valve debridement/repair/annuloplasty on 9/19.  Significant events: 4/12-5/10>> hospitalization for MSSA bacteremia with native TV endocarditis-angio vac procedure on 4/26.Completed cefazolin on 5/10-planned weekly dalbavancin at the ID clinic.   5/22>> outpatient blood culture positive for MSSA 5/24-6/8>> hospitalization [facility] for persistent bacteremia-worsening back pain-discharge to [facility] on IV Ancef. 7/5>> discharged from [facility] on oral cefdinir.  Continued to have intermittent fever postdischarge.   7/24>> admit for-persistent fever/nausea/vomiting/diarrhea-blood cultures positive for MSSA. 9/5>> recurrent fever- blood cultures growing Staph aureus  9/5 >> PICC line discontinued 9/19>> debridement of tricuspid valve/valve reconstruction/annuloplasty 9/27>> transfer to [facility]  Significant studies: 7/25>> CT chest: Multifocal airspace opacities-increasing cavitary nodules 7/25>> MRI thoracic spine: No evidence of infection in thoracic spine.  Significant microbiology data: 7/24>> urine culture: Negative 7/25>> blood culture: 1/2-MSSA. 7/27>> blood culture: No growth 7/29>> blood culture: No growth 9/04>> blood cultures:MRSA 9/07>> blood cultures:neg 9/19>> tricuspid valve tissue culture: Staph aureus-sensitivities pending  Procedures: 8/3>> TEE: 3 vegetations on tricuspid valve-severe TR.  EF 60-65% 9/14>> multiple teeth extraction 9/19>> debridement of tricuspid valve/valve reconstruction/annuloplasty  Consults: ID CT surgery Dentist    Subjective:   Patient in bed, appears comfortable, denies any headache, no fever, no chest pain or pressure, no shortness of breath , no abdominal pain. No new focal weakness.  Objective:  Vitals: Blood pressure (!) 106/51, pulse 60, temperature 98.7 F (37.1 C), temperature source Oral, resp. rate 18, height 5\' 10"  (1.778 m), weight 90.6 kg, SpO2 99 %.   Exam:  Awake Alert, No new F.N deficits, Normal affect [place].AT,PERRAL Supple Neck, No JVD,   Symmetrical Chest wall movement, Good air movement bilaterally, CTAB RRR,No Gallops, Rubs or new Murmurs,  +ve B.Sounds, Abd Soft, No tenderness,   No Cyanosis, Clubbing or edema    Assessment/Plan:  Sepsis due to recurrent MSSA bacteremia with native tricuspid valve endocarditis-s/p valve repair/annuloplasty on 04/23/22 -  Sepsis physiology resolved-discussed with infectious disease MD on 9/28 Dr. Juleen China - needs 6 weeks of Teflaro/daptomycin stop date 06/04/22 - this will unfortunately have to be done as an inpatient.  Goal INR 2-3-pharmacy now managing Coumadin.  Per CT surgery note-needs Coumadin for 3 months from valve repair.  Stop date 07/23/2022.  Normocytic anemia:  Due to chronic disease/recurrent endocarditis: No indication for transfusion.  Anxiety -  Stable-on Lexapro  Methamphetamine use  In remission  Chronic methadone use - Requesting that we slowly taper down his methadone dosage-continue tapering.  Chronic HCV   - outpatient follow-up with ID.  Mild transaminitis resolved  Nutrition Status: Nutrition Problem: Inadequate oral intake Etiology: vomiting, nausea Signs/Symptoms: per patient/family report Interventions: Ensure Enlive (each supplement provides 350kcal and 20 grams of protein), MVI, Snacks  BMI: Estimated body mass index is 28.66 kg/m as calculated from the following:   Height as of this encounter: 5\' 10"  (1.778 m).   Weight as of this encounter: 90.6 kg.   Code status:   Code Status: Full Code    DVT Prophylaxis: SCDs  Start: 04/24/22 0836 SCDs Start: 04/23/22 1259 warfarin (COUMADIN) tablet 6 mg  warfarin (COUMADIN) tablet 4 mg    Family Communication: None at bedside  Disposition Plan: Status is: Inpatient for 6 weeks of IV antibiotics stop date 06/04/22.   Diet: Diet Order             DIET DYS 3 Room service appropriate? Yes; Fluid consistency: Thin  Diet effective now                    MEDICATIONS: Scheduled Meds:  Ferrous Fumarate  1 tablet Oral BID WC   And   vitamin B-12  100 mcg Oral BID WC   And   ascorbic acid  500 mg Oral BID WC   And   folic acid  1 mg Oral BID WC   aspirin EC  81 mg Oral Daily   bisacodyl  10 mg Oral Daily   Or   bisacodyl  10 mg Rectal Daily   Chlorhexidine Gluconate Cloth  6 each Topical Daily   docusate sodium  200 mg Oral BID   escitalopram  10 mg Oral Daily   feeding supplement  237 mL Oral BID BM   gabapentin  300 mg Oral TID   methadone  60 mg Oral Daily   multivitamin with minerals  1 tablet Oral Daily   nicotine  14 mg Transdermal Daily   pantoprazole  40 mg Oral Daily   warfarin  6 mg Oral Once per day on Tue Wed   And   warfarin  4 mg Oral Once per day on Sun Mon Thu Fri Sat   Warfarin - Pharmacist Dosing Inpatient   Does not apply q1600   Continuous Infusions:  ceFTAROline (TEFLARO) IV 600 mg (05/28/22 0912)   DAPTOmycin (CUBICIN) 800 mg in sodium chloride 0.9 % IVPB 800 mg (05/27/22 2030)   promethazine (PHENERGAN) injection (IM or IVPB) Stopped (05/20/22 1020)   PRN Meds:.acetaminophen, alum & mag hydroxide-simeth, bisacodyl, diclofenac Sodium, hydrOXYzine, loperamide, methocarbamol, mouth rinse, oxyCODONE, polyethylene glycol, promethazine (PHENERGAN) injection (IM or IVPB), sodium chloride flush   I have personally reviewed following labs and imaging studies  LABORATORY DATA:  Recent Labs  Lab 05/27/22 0452  WBC 4.7  HGB 10.2*  HCT 32.5*  PLT 176  MCV 83.8  MCH 26.3  MCHC 31.4  RDW 15.0   LYMPHSABS 1.6  MONOABS 0.5  EOSABS 0.4  BASOSABS 0.0    Recent Labs  Lab 05/22/22 0417 05/24/22 0358 05/27/22 0452  NA  --   --  140  K  --   --  4.2  CL  --   --  102  CO2  --   --  29  GLUCOSE  --   --  90  BUN  --   --  16  CREATININE  --   --  0.79  CALCIUM  --   --  9.5  AST  --   --  30  ALT  --   --  29  ALKPHOS  --   --  84  BILITOT  --   --  0.5  ALBUMIN  --   --  3.2*  MG  --   --  2.0  INR 1.9* 2.5* 2.6*     LOS: 92 days   Signature  Lala Lund M.D on 05/28/2022 at 11:22 AM   -  To page go to [email or web address]

## 2022-05-29 DIAGNOSIS — I079 Rheumatic tricuspid valve disease, unspecified: Secondary | ICD-10-CM | POA: Diagnosis not present

## 2022-05-29 LAB — PROTIME-INR
INR: 2.1 — ABNORMAL HIGH (ref 0.8–1.2)
Prothrombin Time: 23.8 seconds — ABNORMAL HIGH (ref 11.4–15.2)

## 2022-05-29 MED ORDER — METHADONE HCL 5 MG PO TABS: 45.0000 mg | ORAL_TABLET | Freq: Every day | ORAL | Status: DC

## 2022-05-29 NOTE — Progress Notes (Addendum)
Ceftaroline (Teflaro) 600mg  in sodium 0.9% 100 ml IVPB at 100 ml/hr due at 1000 has been requested by RX in [facility] and via phone at [phone] due to medication not being available in the pyxis, pt specific bin, or at the RN station.  Medication has not been delivered.

## 2022-05-29 NOTE — Progress Notes (Signed)
[place] for Warfarin Indication:  tricuspid valve repair  No Known Allergies  Patient Measurements: Height: 5\' 10"  (177.8 cm) Weight: 90.6 kg (199 lb 11.8 oz) IBW/kg (Calculated) : 73  Vital Signs: Temp: 97.6 F (36.4 C) (10/25 0445) Temp Source: Oral (10/25 0445) BP: 105/62 (10/25 0445) Pulse Rate: 68 (10/25 0445)  Labs: Recent Labs    05/27/22 0452 05/29/22 0423  HGB 10.2*  --   HCT 32.5*  --   PLT 176  --   LABPROT 28.0* 23.8*  INR 2.6* 2.1*  CREATININE 0.79  --   CKTOTAL 26*  --      Estimated Creatinine Clearance: 151.4 mL/min (by C-G formula based on SCr of 0.79 mg/dL).  Assessment: Reginald Clarke is a 31 yr old male s/p tricuspid valve repair on 04/23/22. Warfarin initiated post-op with plan to treat x 3 months per CT surgery.    Patient continues on ceftaroline and daptomycin through 10/31 (broad spectrum antibiotics may lead to increased INR).  May need higher dose once off IV antibiotics.  INR increased to goal at 2.5 on Warfarin regimen of 6mg  Tuesday and Wednesday and 4mg  daily all other days. Dose adjustment occurred on 10/18 and seeing effects on 10/19. CBC stable (10/16) Hgb 8s-9s stable, pltc wnl stable. No bleeding reported.   10/25 update INR 2.1 No bleeding No compliance issues noted  Goal of Therapy:  INR 2-3 Monitor platelets by anticoagulation protocol: Yes   Plan:  Continue Warfarin 6 mg everyTue & Wed and 4mg  every MThFSS   Monitor INR qMWF   Continue to monitor H&H and platelets   Thank you for allowing pharmacy to be a part of this patient's care.   Vaughan Basta BS, PharmD, BCPS Clinical Pharmacist 05/29/2022 7:47 AM  Contact: [phone] after 3 PM  "Be curious, not judgmental..." -Jamal Maes

## 2022-05-29 NOTE — Progress Notes (Signed)
PROGRESS NOTE        PATIENT DETAILS Name: Reginald Clarke Age: 31 y.o. Sex: male Date of Birth: 12/05/1990 Admit Date: 02/25/2022 Admitting Physician Dorcas Carrow, MD [id number], [name], PA-C  Brief Summary: Patient is a 31 y.o.  male with history of recurrent MSSA native tricuspid valve endocarditis in spite of completing several courses of IV antibiotics.  Evaluated by ID/CT surgery-underwent tricuspid valve debridement/repair/annuloplasty on 9/19.  Significant events: 4/12-5/10>> hospitalization for MSSA bacteremia with native TV endocarditis-angio vac procedure on 4/26.Completed cefazolin on 5/10-planned weekly dalbavancin at the ID clinic.   5/22>> outpatient blood culture positive for MSSA 5/24-6/8>> hospitalization [facility] for persistent bacteremia-worsening back pain-discharge to [facility] on IV Ancef. 7/5>> discharged from [facility] on oral cefdinir.  Continued to have intermittent fever postdischarge.   7/24>> admit for-persistent fever/nausea/vomiting/diarrhea-blood cultures positive for MSSA. 9/5>> recurrent fever- blood cultures growing Staph aureus  9/5 >> PICC line discontinued 9/19>> debridement of tricuspid valve/valve reconstruction/annuloplasty 9/27>> transfer to [facility]  Significant studies: 7/25>> CT chest: Multifocal airspace opacities-increasing cavitary nodules 7/25>> MRI thoracic spine: No evidence of infection in thoracic spine.  Significant microbiology data: 7/24>> urine culture: Negative 7/25>> blood culture: 1/2-MSSA. 7/27>> blood culture: No growth 7/29>> blood culture: No growth 9/04>> blood cultures:MRSA 9/07>> blood cultures:neg 9/19>> tricuspid valve tissue culture: Staph aureus-sensitivities pending  Procedures: 8/3>> TEE: 3 vegetations on tricuspid valve-severe TR.  EF 60-65% 9/14>> multiple teeth extraction 9/19>> debridement of tricuspid valve/valve reconstruction/annuloplasty  Consults: ID CT surgery Dentist    Subjective:   Patient in bed, appears comfortable, denies any headache, no fever, no chest pain or pressure, no shortness of breath , no abdominal pain. No new focal weakness.   Objective:  Vitals: Blood pressure (!) 100/57, pulse 68, temperature 97.8 F (36.6 C), temperature source Oral, resp. rate 16, height 5\' 10"  (1.778 m), weight 90.6 kg, SpO2 (!) 11 %.  (96%)  Exam:  Awake Alert, No new F.N deficits, Normal affect [place].AT,PERRAL Supple Neck, No JVD,   Symmetrical Chest wall movement, Good air movement bilaterally, CTAB RRR,No Gallops, Rubs or new Murmurs,  +ve B.Sounds, Abd Soft, No tenderness,   No Cyanosis, Clubbing or edema     Assessment/Plan:  Sepsis due to recurrent MSSA bacteremia with native tricuspid valve endocarditis-s/p valve repair/annuloplasty on 04/23/22 -  Sepsis physiology resolved-discussed with infectious disease MD on 9/28 Dr. 10/28 - needs 6 weeks of Teflaro/daptomycin stop date 06/04/22 - this will unfortunately have to be done as an inpatient.  Goal INR 2-3-pharmacy now managing Coumadin.  Per CT surgery note-needs Coumadin for 3 months from valve repair.  Stop date 07/23/2022.  Normocytic anemia:  Due to chronic disease/recurrent endocarditis: No indication for transfusion.  Anxiety -  Stable-on Lexapro  Methamphetamine use  In remission  Chronic methadone use - Requesting that we slowly taper down his methadone dosage-continue tapering.  Chronic HCV   - outpatient follow-up with ID.  Mild transaminitis resolved  Nutrition Status: Nutrition Problem: Inadequate oral intake Etiology: vomiting, nausea Signs/Symptoms: per patient/family report Interventions: Ensure Enlive (each supplement provides 350kcal and 20 grams of protein), MVI, Snacks  BMI: Estimated body mass index is 28.66 kg/m as calculated from the following:   Height as of this encounter: 5\' 10"  (1.778 m).   Weight as of this encounter: 90.6 kg.   Code status:   Code Status:  Full Code  DVT Prophylaxis: SCDs Start: 04/24/22 0836 SCDs Start: 04/23/22 1259 warfarin (COUMADIN) tablet 6 mg  warfarin (COUMADIN) tablet 4 mg    Family Communication: None at bedside  Disposition Plan: Status is: Inpatient for 6 weeks of IV antibiotics stop date 06/04/22.   Diet: Diet Order             DIET DYS 3 Room service appropriate? Yes; Fluid consistency: Thin  Diet effective now                    MEDICATIONS: Scheduled Meds:  Ferrous Fumarate  1 tablet Oral BID WC   And   vitamin B-12  100 mcg Oral BID WC   And   ascorbic acid  500 mg Oral BID WC   And   folic acid  1 mg Oral BID WC   aspirin EC  81 mg Oral Daily   bisacodyl  10 mg Oral Daily   Or   bisacodyl  10 mg Rectal Daily   Chlorhexidine Gluconate Cloth  6 each Topical Daily   docusate sodium  200 mg Oral BID   escitalopram  10 mg Oral Daily   feeding supplement  237 mL Oral BID BM   gabapentin  300 mg Oral TID   methadone  55 mg Oral Daily   multivitamin with minerals  1 tablet Oral Daily   nicotine  14 mg Transdermal Daily   pantoprazole  40 mg Oral Daily   warfarin  6 mg Oral Once per day on Tue Wed   And   warfarin  4 mg Oral Once per day on Sun Mon Thu Fri Sat   Warfarin - Pharmacist Dosing Inpatient   Does not apply q1600   Continuous Infusions:  ceFTAROline (TEFLARO) IV 600 mg (05/29/22 0339)   DAPTOmycin (CUBICIN) 800 mg in sodium chloride 0.9 % IVPB 800 mg (05/28/22 2120)   promethazine (PHENERGAN) injection (IM or IVPB) Stopped (05/20/22 1020)   PRN Meds:.acetaminophen, alum & mag hydroxide-simeth, bisacodyl, diclofenac Sodium, hydrOXYzine, loperamide, methocarbamol, mouth rinse, oxyCODONE, polyethylene glycol, promethazine (PHENERGAN) injection (IM or IVPB), sodium chloride flush   I have personally reviewed following labs and imaging studies  LABORATORY DATA:  Recent Labs  Lab 05/27/22 0452  WBC 4.7  HGB 10.2*  HCT 32.5*  PLT 176  MCV 83.8  MCH 26.3  MCHC 31.4   RDW 15.0  LYMPHSABS 1.6  MONOABS 0.5  EOSABS 0.4  BASOSABS 0.0    Recent Labs  Lab 05/24/22 0358 05/27/22 0452 05/29/22 0423  NA  --  140  --   K  --  4.2  --   CL  --  102  --   CO2  --  29  --   GLUCOSE  --  90  --   BUN  --  16  --   CREATININE  --  0.79  --   CALCIUM  --  9.5  --   AST  --  30  --   ALT  --  29  --   ALKPHOS  --  84  --   BILITOT  --  0.5  --   ALBUMIN  --  3.2*  --   MG  --  2.0  --   INR 2.5* 2.6* 2.1*     LOS: 93 days   Signature  Lala Lund M.D on 05/29/2022 at 9:58 AM   -  To page go to [email or web address]

## 2022-05-30 DIAGNOSIS — I079 Rheumatic tricuspid valve disease, unspecified: Secondary | ICD-10-CM | POA: Diagnosis not present

## 2022-05-30 MED ORDER — OXYCODONE HCL 5 MG PO TABS: 10.0000 mg | ORAL_TABLET | Freq: Four times a day (QID) | ORAL | Status: DC | PRN

## 2022-05-30 MED ORDER — METHADONE HCL 5 MG PO TABS
25.0000 mg | ORAL_TABLET | Freq: Every day | ORAL | Status: DC
  Administered 2022-05-31: 25 mg via ORAL
  Filled 2022-05-30: qty 1

## 2022-05-30 MED ORDER — MUPIROCIN 2 % EX OINT
TOPICAL_OINTMENT | Freq: Two times a day (BID) | CUTANEOUS | Status: AC
  Administered 2022-05-30: 1 via NASAL
  Filled 2022-05-30 (×2): qty 22

## 2022-05-30 MED ORDER — METHADONE HCL 10 MG PO TABS
40.0000 mg | ORAL_TABLET | Freq: Every day | ORAL | Status: DC
  Administered 2022-05-30: 40 mg via ORAL
  Filled 2022-05-30: qty 4

## 2022-05-30 NOTE — Progress Notes (Signed)
PROGRESS NOTE        PATIENT DETAILS Name: Reginald Clarke Age: 31 y.o. Sex: male Date of Birth: 23-Aug-1990 Admit Date: 02/25/2022 Admitting Physician [name], MD [id number], Randa Evens, PA-C  Brief Summary: Patient is a 31 y.o.  male with history of recurrent MSSA native tricuspid valve endocarditis in spite of completing several courses of IV antibiotics.  Evaluated by ID/CT surgery-underwent tricuspid valve debridement/repair/annuloplasty on 9/19.  Significant events: 4/12-5/10>> hospitalization for MSSA bacteremia with native TV endocarditis-angio vac procedure on 4/26.Completed cefazolin on 5/10-planned weekly dalbavancin at the ID clinic.   5/22>> outpatient blood culture positive for MSSA 5/24-6/8>> hospitalization [facility] for persistent bacteremia-worsening back pain-discharge to [facility] on IV Ancef. 7/5>> discharged from [facility] on oral cefdinir.  Continued to have intermittent fever postdischarge.   7/24>> admit for-persistent fever/nausea/vomiting/diarrhea-blood cultures positive for MSSA. 9/5>> recurrent fever- blood cultures growing Staph aureus  9/5 >> PICC line discontinued 9/19>> debridement of tricuspid valve/valve reconstruction/annuloplasty 9/27>> transfer to [facility]  Significant studies: 7/25>> CT chest: Multifocal airspace opacities-increasing cavitary nodules 7/25>> MRI thoracic spine: No evidence of infection in thoracic spine.  Significant microbiology data: 7/24>> urine culture: Negative 7/25>> blood culture: 1/2-MSSA. 7/27>> blood culture: No growth 7/29>> blood culture: No growth 9/04>> blood cultures:MRSA 9/07>> blood cultures:neg 9/19>> tricuspid valve tissue culture: Staph aureus-sensitivities pending  Procedures: 8/3>> TEE: 3 vegetations on tricuspid valve-severe TR.  EF 60-65% 9/14>> multiple teeth extraction 9/19>> debridement of tricuspid valve/valve reconstruction/annuloplasty  Consults: ID CT surgery Dentist    Subjective:   Patient in bed, appears comfortable, denies any headache, no fever, no chest pain or pressure, no shortness of breath , no abdominal pain. No new focal weakness.   Objective:  Vitals:   05/29/22 0833 05/30/22 0006 05/30/22 0505 05/30/22 0836  BP: (!) 100/57 (!) 98/47 102/60 (!) 106/54  Pulse:      Temp: 97.8 F (36.6 C) 97.8 F (36.6 C) 97.7 F (36.5 C)   Resp: 16 16 16    Height:      Weight:      SpO2:  (!) 12%    TempSrc: Oral Oral Oral   BMI (Calculated):         Exam:  Awake Alert, No new F.N deficits, Normal affect [place].AT,PERRAL Supple Neck, No JVD,   Symmetrical Chest wall movement, Good air movement bilaterally, CTAB RRR,No Gallops, Rubs or new Murmurs,  +ve B.Sounds, Abd Soft, No tenderness,   No Cyanosis, Clubbing or edema    Assessment/Plan:  Sepsis due to recurrent MSSA bacteremia with native tricuspid valve endocarditis-s/p valve repair/annuloplasty on 04/23/22 -  Sepsis physiology resolved-discussed with infectious disease MD on 9/28 Dr. Juleen China - needs 6 weeks of Teflaro/daptomycin stop date 06/04/22 - this will unfortunately have to be done as an inpatient.  Goal INR 2-3-pharmacy now managing Coumadin.  Per CT surgery note-needs Coumadin for 3 months from valve repair. Coumadin Stop date 07/23/2022.  Normocytic anemia:  Due to chronic disease/recurrent endocarditis: No indication for transfusion.  Anxiety -  Stable-on Lexapro  Methamphetamine use  In remission  Chronic methadone use - Requesting that we slowly taper down his methadone dosage-continue tapering.  Chronic HCV   - outpatient follow-up with ID.  Mild transaminitis resolved  Nutrition Status: Nutrition Problem: Inadequate oral intake Etiology: vomiting, nausea Signs/Symptoms: per patient/family report Interventions: Ensure Enlive (each supplement provides 350kcal and 20 grams of  protein), MVI, Snacks  BMI: Estimated body mass index is 28.66 kg/m as calculated from the  following:   Height as of this encounter: 5\' 10"  (1.778 m).   Weight as of this encounter: 90.6 kg.   Code status:   Code Status: Full Code   DVT Prophylaxis: SCDs Start: 04/24/22 0836 SCDs Start: 04/23/22 1259 warfarin (COUMADIN) tablet 6 mg  warfarin (COUMADIN) tablet 4 mg    Family Communication: None at bedside  Disposition Plan: Status is: Inpatient for 6 weeks of IV antibiotics stop date 06/04/22.   Diet: Diet Order             DIET DYS 3 Room service appropriate? Yes; Fluid consistency: Thin  Diet effective now                    MEDICATIONS: Scheduled Meds:  Ferrous Fumarate  1 tablet Oral BID WC   And   vitamin B-12  100 mcg Oral BID WC   And   ascorbic acid  500 mg Oral BID WC   And   folic acid  1 mg Oral BID WC   aspirin EC  81 mg Oral Daily   bisacodyl  10 mg Oral Daily   Or   bisacodyl  10 mg Rectal Daily   Chlorhexidine Gluconate Cloth  6 each Topical Daily   docusate sodium  200 mg Oral BID   escitalopram  10 mg Oral Daily   feeding supplement  237 mL Oral BID BM   gabapentin  300 mg Oral TID   [START ON 05/31/2022] methadone  25 mg Oral Daily   multivitamin with minerals  1 tablet Oral Daily   nicotine  14 mg Transdermal Daily   pantoprazole  40 mg Oral Daily   warfarin  6 mg Oral Once per day on Tue Wed   And   warfarin  4 mg Oral Once per day on Sun Mon Thu Fri Sat   Warfarin - Pharmacist Dosing Inpatient   Does not apply q1600   Continuous Infusions:  ceFTAROline (TEFLARO) IV 600 mg (05/29/22 2053)   DAPTOmycin (CUBICIN) 800 mg in sodium chloride 0.9 % IVPB 800 mg (05/29/22 1953)   promethazine (PHENERGAN) injection (IM or IVPB) Stopped (05/20/22 1020)   PRN Meds:.acetaminophen, alum & mag hydroxide-simeth, bisacodyl, diclofenac Sodium, hydrOXYzine, loperamide, methocarbamol, mouth rinse, polyethylene glycol, promethazine (PHENERGAN) injection (IM or IVPB), sodium chloride flush   I have personally reviewed following labs and  imaging studies  LABORATORY DATA:  Recent Labs  Lab 05/27/22 0452  WBC 4.7  HGB 10.2*  HCT 32.5*  PLT 176  MCV 83.8  MCH 26.3  MCHC 31.4  RDW 15.0  LYMPHSABS 1.6  MONOABS 0.5  EOSABS 0.4  BASOSABS 0.0    Recent Labs  Lab 05/24/22 0358 05/27/22 0452 05/29/22 0423  NA  --  140  --   K  --  4.2  --   CL  --  102  --   CO2  --  29  --   GLUCOSE  --  90  --   BUN  --  16  --   CREATININE  --  0.79  --   CALCIUM  --  9.5  --   AST  --  30  --   ALT  --  29  --   ALKPHOS  --  84  --   BILITOT  --  0.5  --   ALBUMIN  --  3.2*  --   MG  --  2.0  --   INR 2.5* 2.6* 2.1*     LOS: 94 days   Signature  Lala Lund M.D on 05/30/2022 at 8:44 AM   -  To page go to [email or web address]

## 2022-05-31 DIAGNOSIS — I079 Rheumatic tricuspid valve disease, unspecified: Secondary | ICD-10-CM | POA: Diagnosis not present

## 2022-05-31 LAB — PROTIME-INR
INR: 2.5 — ABNORMAL HIGH (ref 0.8–1.2)
Prothrombin Time: 26.6 seconds — ABNORMAL HIGH (ref 11.4–15.2)

## 2022-05-31 MED ORDER — METHADONE HCL 10 MG PO TABS: 10.0000 mg | ORAL_TABLET | Freq: Every day | ORAL | Status: DC

## 2022-05-31 NOTE — Progress Notes (Signed)
[name] for Warfarin Indication:  tricuspid valve repair  No Known Allergies  Patient Measurements: Height: 5\' 10"  (177.8 cm) Weight: 86.8 kg (191 lb 5.8 oz) IBW/kg (Calculated) : 73  Vital Signs: Temp: 97.9 F (36.6 C) (10/27 0259) Temp Source: Oral (10/27 0259) BP: 101/52 (10/27 0259) Pulse Rate: 56 (10/27 0259)  Labs: Recent Labs    05/29/22 0423 05/31/22 0519  LABPROT 23.8* 26.6*  INR 2.1* 2.5*     Estimated Creatinine Clearance: 138.1 mL/min (by C-G formula based on SCr of 0.79 mg/dL).  Assessment: Reginald Clarke is a 31 yr old male s/p tricuspid valve repair on 04/23/22. Warfarin initiated post-op with plan to treat x 3 months per CT surgery.    Patient continues on ceftaroline and daptomycin through 10/31 (broad spectrum antibiotics may lead to increased INR).  May need higher dose once off IV antibiotics.  INR increased to goal at 2.5 on Warfarin regimen of 6mg  Tuesday and Wednesday and 4mg  daily all other days. Dose adjustment occurred on 10/18 and seeing effects on 10/19. CBC stable (10/16) Hgb 8s-9s stable, pltc wnl stable. No bleeding reported.   10/27 update INR 2.5 No bleeding No compliance issues noted  Goal of Therapy:  INR 2-3 Monitor platelets by anticoagulation protocol: Yes   Plan:  Continue Warfarin 6 mg everyTue & Wed and 4mg  every MThFSS   Monitor INR qMTH   Continue to monitor H&H and platelets   Thank you for allowing pharmacy to be a part of this patient's care.   Vaughan Basta BS, PharmD, BCPS Clinical Pharmacist 05/31/2022 7:19 AM  Contact: [phone] after 3 PM  "Be curious, not judgmental..." -Jamal Maes

## 2022-05-31 NOTE — Progress Notes (Signed)
PROGRESS NOTE        PATIENT DETAILS Name: Reginald Clarke Age: 31 y.o. Sex: male Date of Birth: 07/02/1991 Admit Date: 02/25/2022 Admitting Physician Dorcas Carrow, MD [id number], [name], PA-C  Brief Summary: Patient is a 31 y.o.  male with history of recurrent MSSA native tricuspid valve endocarditis in spite of completing several courses of IV antibiotics.  Evaluated by ID/CT surgery-underwent tricuspid valve debridement/repair/annuloplasty on 9/19.  Significant events: 4/12-5/10>> hospitalization for MSSA bacteremia with native TV endocarditis-angio vac procedure on 4/26.Completed cefazolin on 5/10-planned weekly dalbavancin at the ID clinic.   5/22>> outpatient blood culture positive for MSSA 5/24-6/8>> hospitalization [facility] for persistent bacteremia-worsening back pain-discharge to [facility] on IV Ancef. 7/5>> discharged from [facility] on oral cefdinir.  Continued to have intermittent fever postdischarge.   7/24>> admit for-persistent fever/nausea/vomiting/diarrhea-blood cultures positive for MSSA. 9/5>> recurrent fever- blood cultures growing Staph aureus  9/5 >> PICC line discontinued 9/19>> debridement of tricuspid valve/valve reconstruction/annuloplasty 9/27>> transfer to [facility]  Significant studies: 7/25>> CT chest: Multifocal airspace opacities-increasing cavitary nodules 7/25>> MRI thoracic spine: No evidence of infection in thoracic spine.  Significant microbiology data: 7/24>> urine culture: Negative 7/25>> blood culture: 1/2-MSSA. 7/27>> blood culture: No growth 7/29>> blood culture: No growth 9/04>> blood cultures:MRSA 9/07>> blood cultures:neg 9/19>> tricuspid valve tissue culture: Staph aureus-sensitivities pending  Procedures: 8/3>> TEE: 3 vegetations on tricuspid valve-severe TR.  EF 60-65% 9/14>> multiple teeth extraction 9/19>> debridement of tricuspid valve/valve reconstruction/annuloplasty  Consults: ID CT surgery Dentist    Subjective:   Patient in bed, appears comfortable, denies any headache, no fever, no chest pain or pressure, no shortness of breath , no abdominal pain. No new focal weakness.   Objective:  Vitals:   05/31/22 0259 05/31/22 0500 05/31/22 0700 05/31/22 0842  BP: (!) 101/52  (!) 109/58 (!) 109/53  Pulse: (!) 56  (!) 58   Temp: 97.9 F (36.6 C)  97.7 F (36.5 C)   Resp: 16  16   Height:      Weight:  86.8 kg    SpO2: 98%  100%   TempSrc: Oral  Oral   BMI (Calculated):         Exam:  Awake Alert, No new F.N deficits, Normal affect [place].AT,PERRAL Supple Neck, No JVD,   Symmetrical Chest wall movement, Good air movement bilaterally, CTAB RRR,No Gallops, Rubs or new Murmurs,  +ve B.Sounds, Abd Soft, No tenderness,   No Cyanosis, Clubbing or edema     Assessment/Plan:  Sepsis due to recurrent MSSA bacteremia with native tricuspid valve endocarditis-s/p valve repair/annuloplasty on 04/23/22 -  Sepsis physiology resolved-discussed with infectious disease MD on 9/28 Dr. Earlene Plater - needs 6 weeks of Teflaro/daptomycin stop date 06/04/22 - this will unfortunately have to be done as an inpatient.  Goal INR 2-3-pharmacy now managing Coumadin.  Per CT surgery note-needs Coumadin for 3 months from valve repair. Coumadin Stop date 07/23/2022.  Normocytic anemia:  Due to chronic disease/recurrent endocarditis: No indication for transfusion.  Anxiety -  Stable-on Lexapro  Methamphetamine use  In remission  Chronic methadone use - Requesting that we slowly taper down his methadone dosage-continue tapering.  Chronic HCV   - outpatient follow-up with ID.  Mild transaminitis resolved  Nutrition Status: Nutrition Problem: Inadequate oral intake Etiology: vomiting, nausea Signs/Symptoms: per patient/family report Interventions: Ensure Enlive (each supplement provides 350kcal and 20 grams of  protein), MVI, Snacks  BMI: Estimated body mass index is 27.46 kg/m as calculated from the  following:   Height as of this encounter: 5\' 10"  (1.778 m).   Weight as of this encounter: 86.8 kg.   Code status:   Code Status: Full Code   DVT Prophylaxis: SCDs Start: 04/24/22 0836 SCDs Start: 04/23/22 1259 warfarin (COUMADIN) tablet 6 mg  warfarin (COUMADIN) tablet 4 mg    Family Communication: None at bedside  Disposition Plan: Status is: Inpatient for 6 weeks of IV antibiotics stop date 06/04/22.   Diet: Diet Order             DIET DYS 3 Room service appropriate? Yes; Fluid consistency: Thin  Diet effective now                    MEDICATIONS: Scheduled Meds:  Ferrous Fumarate  1 tablet Oral BID WC   And   vitamin B-12  100 mcg Oral BID WC   And   ascorbic acid  500 mg Oral BID WC   And   folic acid  1 mg Oral BID WC   aspirin EC  81 mg Oral Daily   bisacodyl  10 mg Oral Daily   Or   bisacodyl  10 mg Rectal Daily   Chlorhexidine Gluconate Cloth  6 each Topical Daily   docusate sodium  200 mg Oral BID   escitalopram  10 mg Oral Daily   feeding supplement  237 mL Oral BID BM   gabapentin  300 mg Oral TID   [START ON 06/01/2022] methadone  10 mg Oral Daily   multivitamin with minerals  1 tablet Oral Daily   mupirocin ointment   Nasal BID   nicotine  14 mg Transdermal Daily   pantoprazole  40 mg Oral Daily   warfarin  6 mg Oral Once per day on Tue Wed   And   warfarin  4 mg Oral Once per day on Sun Mon Thu Fri Sat   Warfarin - Pharmacist Dosing Inpatient   Does not apply q1600   Continuous Infusions:  ceFTAROline (TEFLARO) IV 600 mg (05/31/22 0825)   DAPTOmycin (CUBICIN) 800 mg in sodium chloride 0.9 % IVPB 800 mg (05/30/22 1958)   promethazine (PHENERGAN) injection (IM or IVPB) Stopped (05/20/22 1020)   PRN Meds:.acetaminophen, alum & mag hydroxide-simeth, bisacodyl, diclofenac Sodium, hydrOXYzine, loperamide, methocarbamol, mouth rinse, polyethylene glycol, promethazine (PHENERGAN) injection (IM or IVPB), sodium chloride flush   I have  personally reviewed following labs and imaging studies  LABORATORY DATA:  Recent Labs  Lab 05/27/22 0452  WBC 4.7  HGB 10.2*  HCT 32.5*  PLT 176  MCV 83.8  MCH 26.3  MCHC 31.4  RDW 15.0  LYMPHSABS 1.6  MONOABS 0.5  EOSABS 0.4  BASOSABS 0.0    Recent Labs  Lab 05/27/22 0452 05/29/22 0423 05/31/22 0519  NA 140  --   --   K 4.2  --   --   CL 102  --   --   CO2 29  --   --   GLUCOSE 90  --   --   BUN 16  --   --   CREATININE 0.79  --   --   CALCIUM 9.5  --   --   AST 30  --   --   ALT 29  --   --   ALKPHOS 84  --   --   BILITOT 0.5  --   --  ALBUMIN 3.2*  --   --   MG 2.0  --   --   INR 2.6* 2.1* 2.5*     LOS: 95 days   Signature  Susa Raring M.D on 05/31/2022 at 9:55 AM   -  To page go to [email or web address]

## 2022-05-31 NOTE — TOC Progression Note (Signed)
Transition of Care [facility]) - Progression Note    Patient Details  Name: Reginald Clarke MRN: [id number] Date of Birth: 1990/10/15  Transition of Care [facility] CM/SW [place], RN Phone Number: 05/31/2022, 1:52 PM  Clinical Narrative:     IV antibiotics expected to stop on 10/31, MD and patient have weaned down his methadone, should be off within a couple of days. Patient states to MD he is feeling great and very excited about the methadone wean.   Expected Discharge Plan: Home/Self Care Barriers to Discharge: Continued Medical Work up  Expected Discharge Plan and Services Expected Discharge Plan: Home/Self Care   Discharge Planning Services: CM Consult   Living arrangements for the past 2 months: Single Family Home                 DME Arranged: N/A         HH Arranged: NA           Social Determinants of Health (SDOH) Interventions    Readmission Risk Interventions    02/26/2022   10:07 AM  Readmission Risk Prevention Plan  Transportation Screening Complete  PCP or Specialist Appt within 3-5 Days Complete  HRI or Home Care Consult Complete  Palliative Care Screening Not Applicable

## 2022-06-01 DIAGNOSIS — I079 Rheumatic tricuspid valve disease, unspecified: Secondary | ICD-10-CM | POA: Diagnosis not present

## 2022-06-01 MED ORDER — METHADONE HCL 5 MG PO TABS
5.0000 mg | ORAL_TABLET | Freq: Every day | ORAL | Status: DC
  Administered 2022-06-02: 5 mg via ORAL
  Filled 2022-06-01: qty 1

## 2022-06-01 MED ORDER — METHADONE HCL 10 MG PO TABS
10.0000 mg | ORAL_TABLET | Freq: Once | ORAL | Status: AC
  Administered 2022-06-01: 10 mg via ORAL
  Filled 2022-06-01: qty 1

## 2022-06-01 NOTE — Progress Notes (Signed)
PROGRESS NOTE        PATIENT DETAILS Name: Reginald Clarke Age: 31 y.o. Sex: male Date of Birth: [birth year] Admit Date: 02/25/2022 Admitting Physician Dorcas Carrow, MD [id number], [name], PA-C  Brief Summary: Patient is a 31 y.o.  male with history of recurrent MSSA native tricuspid valve endocarditis in spite of completing several courses of IV antibiotics.  Evaluated by ID/CT surgery-underwent tricuspid valve debridement/repair/annuloplasty on 9/19.  Significant events: 4/12-5/10>> hospitalization for MSSA bacteremia with native TV endocarditis-angio vac procedure on 4/26.Completed cefazolin on 5/10-planned weekly dalbavancin at the ID clinic.   5/22>> outpatient blood culture positive for MSSA 5/24-6/8>> hospitalization [facility] for persistent bacteremia-worsening back pain-discharge to [facility] on IV Ancef. 7/5>> discharged from [facility] on oral cefdinir.  Continued to have intermittent fever postdischarge.   7/24>> admit for-persistent fever/nausea/vomiting/diarrhea-blood cultures positive for MSSA. 9/5>> recurrent fever- blood cultures growing Staph aureus  9/5 >> PICC line discontinued 9/19>> debridement of tricuspid valve/valve reconstruction/annuloplasty 9/27>> transfer to [facility]  Significant studies: 7/25>> CT chest: Multifocal airspace opacities-increasing cavitary nodules 7/25>> MRI thoracic spine: No evidence of infection in thoracic spine.  Significant microbiology data: 7/24>> urine culture: Negative 7/25>> blood culture: 1/2-MSSA. 7/27>> blood culture: No growth 7/29>> blood culture: No growth 9/04>> blood cultures:MRSA 9/07>> blood cultures:neg 9/19>> tricuspid valve tissue culture: Staph aureus-sensitivities pending  Procedures: 8/3>> TEE: 3 vegetations on tricuspid valve-severe TR.  EF 60-65% 9/14>> multiple teeth extraction 9/19>> debridement of tricuspid valve/valve reconstruction/annuloplasty  Consults: ID CT surgery Dentist    Subjective:   Patient in bed, appears comfortable, denies any headache, no fever, no chest pain or pressure, no shortness of breath , no abdominal pain. No new focal weakness.    Objective:  Vitals:   05/31/22 2017 05/31/22 2345 06/01/22 0353 06/01/22 0456  BP: (!) 109/52 (!) 108/52  (!) 101/55  Pulse: 65 63  66  Temp: 98.6 F (37 C) 97.6 F (36.4 C)  97.6 F (36.4 C)  Resp: 18 18  18   Height:      Weight:   86.9 kg   SpO2:    100%  TempSrc: Oral Oral  Oral  BMI (Calculated):         Exam:  Awake Alert, No new F.N deficits, Normal affect [place].AT,PERRAL Supple Neck, No JVD,   Symmetrical Chest wall movement, Good air movement bilaterally, CTAB RRR,No Gallops, Rubs or new Murmurs,  +ve B.Sounds, Abd Soft, No tenderness,   No Cyanosis, Clubbing or edema   Assessment/Plan:  Sepsis due to recurrent MSSA bacteremia with native tricuspid valve endocarditis-s/p valve repair/annuloplasty on 04/23/22 -  Sepsis physiology resolved-discussed with infectious disease MD on 9/28 Dr. 10/28 - needs 6 weeks of Teflaro/daptomycin stop date 06/04/22 - this will unfortunately have to be done as an inpatient.  Goal INR 2-3-pharmacy now managing Coumadin.  Per CT surgery note-needs Coumadin for 3 months from valve repair. Coumadin Stop date 07/23/2022.  Normocytic anemia:  Due to chronic disease/recurrent endocarditis: No indication for transfusion.  Anxiety -  Stable-on Lexapro  Methamphetamine use  In remission  Chronic methadone use - Requesting that we slowly taper down his methadone dosage-continue tapering.  Chronic HCV   - outpatient follow-up with ID.  Mild transaminitis resolved  Nutrition Status: Nutrition Problem: Inadequate oral intake Etiology: vomiting, nausea Signs/Symptoms: per patient/family report Interventions: Ensure Enlive (each supplement provides 350kcal and 20 grams of  protein), MVI, Snacks  BMI: Estimated body mass index is 27.49 kg/m as calculated from  the following:   Height as of this encounter: 5\' 10"  (1.778 m).   Weight as of this encounter: 86.9 kg.   Code status:   Code Status: Full Code   DVT Prophylaxis: SCDs Start: 04/24/22 0836 SCDs Start: 04/23/22 1259 warfarin (COUMADIN) tablet 6 mg  warfarin (COUMADIN) tablet 4 mg    Family Communication: None at bedside  Disposition Plan: Status is: Inpatient for 6 weeks of IV antibiotics stop date 06/04/22.   Diet: Diet Order             DIET DYS 3 Room service appropriate? Yes; Fluid consistency: Thin  Diet effective now                    MEDICATIONS: Scheduled Meds:  Ferrous Fumarate  1 tablet Oral BID WC   And   vitamin B-12  100 mcg Oral BID WC   And   ascorbic acid  500 mg Oral BID WC   And   folic acid  1 mg Oral BID WC   aspirin EC  81 mg Oral Daily   bisacodyl  10 mg Oral Daily   Or   bisacodyl  10 mg Rectal Daily   Chlorhexidine Gluconate Cloth  6 each Topical Daily   docusate sodium  200 mg Oral BID   escitalopram  10 mg Oral Daily   feeding supplement  237 mL Oral BID BM   gabapentin  300 mg Oral TID   [START ON 06/02/2022] methadone  5 mg Oral Daily   multivitamin with minerals  1 tablet Oral Daily   mupirocin ointment   Nasal BID   nicotine  14 mg Transdermal Daily   pantoprazole  40 mg Oral Daily   warfarin  6 mg Oral Once per day on Tue Wed   And   warfarin  4 mg Oral Once per day on Sun Mon Thu Fri Sat   Warfarin - Pharmacist Dosing Inpatient   Does not apply q1600   Continuous Infusions:  ceFTAROline (TEFLARO) IV 600 mg (05/31/22 2317)   DAPTOmycin (CUBICIN) 800 mg in sodium chloride 0.9 % IVPB 800 mg (05/31/22 1950)   promethazine (PHENERGAN) injection (IM or IVPB) Stopped (05/20/22 1020)   PRN Meds:.acetaminophen, alum & mag hydroxide-simeth, bisacodyl, diclofenac Sodium, hydrOXYzine, loperamide, methocarbamol, mouth rinse, polyethylene glycol, promethazine (PHENERGAN) injection (IM or IVPB), sodium chloride flush   I have  personally reviewed following labs and imaging studies  LABORATORY DATA:  Recent Labs  Lab 05/27/22 0452  WBC 4.7  HGB 10.2*  HCT 32.5*  PLT 176  MCV 83.8  MCH 26.3  MCHC 31.4  RDW 15.0  LYMPHSABS 1.6  MONOABS 0.5  EOSABS 0.4  BASOSABS 0.0    Recent Labs  Lab 05/27/22 0452 05/29/22 0423 05/31/22 0519  NA 140  --   --   K 4.2  --   --   CL 102  --   --   CO2 29  --   --   GLUCOSE 90  --   --   BUN 16  --   --   CREATININE 0.79  --   --   CALCIUM 9.5  --   --   AST 30  --   --   ALT 29  --   --   ALKPHOS 84  --   --   BILITOT 0.5  --   --  ALBUMIN 3.2*  --   --   MG 2.0  --   --   INR 2.6* 2.1* 2.5*     LOS: 96 days   Signature  Susa Raring M.D on 06/01/2022 at 8:26 AM   -  To page go to [email or web address]

## 2022-06-02 DIAGNOSIS — I079 Rheumatic tricuspid valve disease, unspecified: Secondary | ICD-10-CM | POA: Diagnosis not present

## 2022-06-02 NOTE — Progress Notes (Signed)
PROGRESS NOTE        PATIENT DETAILS Name: Reginald Clarke Age: 31 y.o. Sex: male Date of Birth: December 01, 1990 Admit Date: 02/25/2022 Admitting Physician [name], MD [id number], Randa Evens, PA-C  Brief Summary: Patient is a 31 y.o.  male with history of recurrent MSSA native tricuspid valve endocarditis in spite of completing several courses of IV antibiotics.  Evaluated by ID/CT surgery-underwent tricuspid valve debridement/repair/annuloplasty on 9/19.  Significant events: 4/12-5/10>> hospitalization for MSSA bacteremia with native TV endocarditis-angio vac procedure on 4/26.Completed cefazolin on 5/10-planned weekly dalbavancin at the ID clinic.   5/22>> outpatient blood culture positive for MSSA 5/24-6/8>> hospitalization [facility] for persistent bacteremia-worsening back pain-discharge to [facility] on IV Ancef. 7/5>> discharged from [facility] on oral cefdinir.  Continued to have intermittent fever postdischarge.   7/24>> admit for-persistent fever/nausea/vomiting/diarrhea-blood cultures positive for MSSA. 9/5>> recurrent fever- blood cultures growing Staph aureus  9/5 >> PICC line discontinued 9/19>> debridement of tricuspid valve/valve reconstruction/annuloplasty 9/27>> transfer to [facility]  Significant studies: 7/25>> CT chest: Multifocal airspace opacities-increasing cavitary nodules 7/25>> MRI thoracic spine: No evidence of infection in thoracic spine.  Significant microbiology data: 7/24>> urine culture: Negative 7/25>> blood culture: 1/2-MSSA. 7/27>> blood culture: No growth 7/29>> blood culture: No growth 9/04>> blood cultures:MRSA 9/07>> blood cultures:neg 9/19>> tricuspid valve tissue culture: Staph aureus-sensitivities pending  Procedures: 8/3>> TEE: 3 vegetations on tricuspid valve-severe TR.  EF 60-65% 9/14>> multiple teeth extraction 9/19>> debridement of tricuspid valve/valve reconstruction/annuloplasty  Consults: ID CT surgery Dentist    Subjective:   Patient in bed, appears comfortable, denies any headache, no fever, no chest pain or pressure, no shortness of breath , no abdominal pain. No new focal weakness.  Objective:  Vitals:   06/01/22 2128 06/02/22 0041 06/02/22 0355 06/02/22 0702  BP: (!) 105/58 (!) 102/44 (!) 101/57   Pulse: 72 70 69   Temp: 98.1 F (36.7 C) 98.3 F (36.8 C) 98.2 F (36.8 C)   Resp: 18 18 18    Height:      Weight:    119.7 kg  SpO2: 90% 92% 90%   TempSrc: Oral Oral Oral   BMI (Calculated):         Exam:  Awake Alert, No new F.N deficits, Normal affect [place].AT,PERRAL Supple Neck, No JVD,   Symmetrical Chest wall movement, Good air movement bilaterally, CTAB RRR,No Gallops, Rubs or new Murmurs,  +ve B.Sounds, Abd Soft, No tenderness,   No Cyanosis, Clubbing or edema   Assessment/Plan:  Sepsis due to recurrent MSSA bacteremia with native tricuspid valve endocarditis-s/p valve repair/annuloplasty on 04/23/22 -  Sepsis physiology resolved-discussed with infectious disease MD on 9/28 Dr. Juleen China - needs 6 weeks of Teflaro/daptomycin stop date 06/04/22 - this will unfortunately have to be done as an inpatient.  Goal INR 2-3-pharmacy now managing Coumadin.  Per CT surgery note-needs Coumadin for 3 months from valve repair. Coumadin Stop date 07/23/2022.  Normocytic anemia:  Due to chronic disease/recurrent endocarditis: No indication for transfusion.  Anxiety -  Stable-on Lexapro  Methamphetamine use  In remission  Chronic methadone use - Requesting that we slowly taper down his methadone dosage-continue tapering.  Chronic HCV   - outpatient follow-up with ID.  Mild transaminitis resolved  Nutrition Status: Nutrition Problem: Inadequate oral intake Etiology: vomiting, nausea Signs/Symptoms: per patient/family report Interventions: Ensure Enlive (each supplement provides 350kcal and 20 grams of protein), MVI,  Snacks  BMI: Estimated body mass index is 37.86 kg/m as calculated  from the following:   Height as of this encounter: 5\' 10"  (1.778 m).   Weight as of this encounter: 119.7 kg.   Code status:   Code Status: Full Code   DVT Prophylaxis: SCDs Start: 04/24/22 0836 SCDs Start: 04/23/22 1259 warfarin (COUMADIN) tablet 6 mg  warfarin (COUMADIN) tablet 4 mg    Family Communication: None at bedside  Disposition Plan:   Status is: Inpatient for 6 weeks of IV antibiotics stop date 06/04/22.   Diet: Diet Order             DIET DYS 3 Room service appropriate? Yes; Fluid consistency: Thin  Diet effective now                    MEDICATIONS: Scheduled Meds:  Ferrous Fumarate  1 tablet Oral BID WC   And   vitamin B-12  100 mcg Oral BID WC   And   ascorbic acid  500 mg Oral BID WC   And   folic acid  1 mg Oral BID WC   aspirin EC  81 mg Oral Daily   bisacodyl  10 mg Oral Daily   Or   bisacodyl  10 mg Rectal Daily   Chlorhexidine Gluconate Cloth  6 each Topical Daily   docusate sodium  200 mg Oral BID   escitalopram  10 mg Oral Daily   feeding supplement  237 mL Oral BID BM   gabapentin  300 mg Oral TID   methadone  5 mg Oral Daily   multivitamin with minerals  1 tablet Oral Daily   mupirocin ointment   Nasal BID   nicotine  14 mg Transdermal Daily   pantoprazole  40 mg Oral Daily   warfarin  6 mg Oral Once per day on Tue Wed   And   warfarin  4 mg Oral Once per day on Sun Mon Thu Fri Sat   Warfarin - Pharmacist Dosing Inpatient   Does not apply q1600   Continuous Infusions:  ceFTAROline (TEFLARO) IV 600 mg (06/02/22 0936)   DAPTOmycin (CUBICIN) 800 mg in sodium chloride 0.9 % IVPB 800 mg (06/01/22 2118)   promethazine (PHENERGAN) injection (IM or IVPB) Stopped (05/20/22 1020)   PRN Meds:.acetaminophen, alum & mag hydroxide-simeth, bisacodyl, diclofenac Sodium, hydrOXYzine, loperamide, methocarbamol, mouth rinse, polyethylene glycol, promethazine (PHENERGAN) injection (IM or IVPB), sodium chloride flush   I have personally reviewed  following labs and imaging studies  LABORATORY DATA:  Recent Labs  Lab 05/27/22 0452  WBC 4.7  HGB 10.2*  HCT 32.5*  PLT 176  MCV 83.8  MCH 26.3  MCHC 31.4  RDW 15.0  LYMPHSABS 1.6  MONOABS 0.5  EOSABS 0.4  BASOSABS 0.0    Recent Labs  Lab 05/27/22 0452 05/29/22 0423 05/31/22 0519  NA 140  --   --   K 4.2  --   --   CL 102  --   --   CO2 29  --   --   GLUCOSE 90  --   --   BUN 16  --   --   CREATININE 0.79  --   --   CALCIUM 9.5  --   --   AST 30  --   --   ALT 29  --   --   ALKPHOS 84  --   --   BILITOT 0.5  --   --  ALBUMIN 3.2*  --   --   MG 2.0  --   --   INR 2.6* 2.1* 2.5*     LOS: 97 days   Signature  Lala Lund M.D on 06/02/2022 at 9:53 AM   -  To page go to [email or web address]

## 2022-06-02 NOTE — Progress Notes (Signed)
PROGRESS NOTE        PATIENT DETAILS Name: Reginald Clarke Age: 31 y.o. Sex: male Date of Birth: December 01, 1990 Admit Date: 02/25/2022 Admitting Physician [name], MD [id number], Randa Evens, PA-C  Brief Summary: Patient is a 31 y.o.  male with history of recurrent MSSA native tricuspid valve endocarditis in spite of completing several courses of IV antibiotics.  Evaluated by ID/CT surgery-underwent tricuspid valve debridement/repair/annuloplasty on 9/19.  Significant events: 4/12-5/10>> hospitalization for MSSA bacteremia with native TV endocarditis-angio vac procedure on 4/26.Completed cefazolin on 5/10-planned weekly dalbavancin at the ID clinic.   5/22>> outpatient blood culture positive for MSSA 5/24-6/8>> hospitalization [facility] for persistent bacteremia-worsening back pain-discharge to [facility] on IV Ancef. 7/5>> discharged from [facility] on oral cefdinir.  Continued to have intermittent fever postdischarge.   7/24>> admit for-persistent fever/nausea/vomiting/diarrhea-blood cultures positive for MSSA. 9/5>> recurrent fever- blood cultures growing Staph aureus  9/5 >> PICC line discontinued 9/19>> debridement of tricuspid valve/valve reconstruction/annuloplasty 9/27>> transfer to [facility]  Significant studies: 7/25>> CT chest: Multifocal airspace opacities-increasing cavitary nodules 7/25>> MRI thoracic spine: No evidence of infection in thoracic spine.  Significant microbiology data: 7/24>> urine culture: Negative 7/25>> blood culture: 1/2-MSSA. 7/27>> blood culture: No growth 7/29>> blood culture: No growth 9/04>> blood cultures:MRSA 9/07>> blood cultures:neg 9/19>> tricuspid valve tissue culture: Staph aureus-sensitivities pending  Procedures: 8/3>> TEE: 3 vegetations on tricuspid valve-severe TR.  EF 60-65% 9/14>> multiple teeth extraction 9/19>> debridement of tricuspid valve/valve reconstruction/annuloplasty  Consults: ID CT surgery Dentist    Subjective:   Patient in bed, appears comfortable, denies any headache, no fever, no chest pain or pressure, no shortness of breath , no abdominal pain. No new focal weakness.  Objective:  Vitals:   06/01/22 2128 06/02/22 0041 06/02/22 0355 06/02/22 0702  BP: (!) 105/58 (!) 102/44 (!) 101/57   Pulse: 72 70 69   Temp: 98.1 F (36.7 C) 98.3 F (36.8 C) 98.2 F (36.8 C)   Resp: 18 18 18    Height:      Weight:    119.7 kg  SpO2: 90% 92% 90%   TempSrc: Oral Oral Oral   BMI (Calculated):         Exam:  Awake Alert, No new F.N deficits, Normal affect [place].AT,PERRAL Supple Neck, No JVD,   Symmetrical Chest wall movement, Good air movement bilaterally, CTAB RRR,No Gallops, Rubs or new Murmurs,  +ve B.Sounds, Abd Soft, No tenderness,   No Cyanosis, Clubbing or edema   Assessment/Plan:  Sepsis due to recurrent MSSA bacteremia with native tricuspid valve endocarditis-s/p valve repair/annuloplasty on 04/23/22 -  Sepsis physiology resolved-discussed with infectious disease MD on 9/28 Dr. Juleen China - needs 6 weeks of Teflaro/daptomycin stop date 06/04/22 - this will unfortunately have to be done as an inpatient.  Goal INR 2-3-pharmacy now managing Coumadin.  Per CT surgery note-needs Coumadin for 3 months from valve repair. Coumadin Stop date 07/23/2022.  Normocytic anemia:  Due to chronic disease/recurrent endocarditis: No indication for transfusion.  Anxiety -  Stable-on Lexapro  Methamphetamine use  In remission  Chronic methadone use - Requesting that we slowly taper down his methadone dosage-continue tapering.  Chronic HCV   - outpatient follow-up with ID.  Mild transaminitis resolved  Nutrition Status: Nutrition Problem: Inadequate oral intake Etiology: vomiting, nausea Signs/Symptoms: per patient/family report Interventions: Ensure Enlive (each supplement provides 350kcal and 20 grams of protein), MVI,  Snacks  BMI: Estimated body mass index is 37.86 kg/m as calculated  from the following:   Height as of this encounter: 5\' 10"  (1.778 m).   Weight as of this encounter: 119.7 kg.   Code status:   Code Status: Full Code   DVT Prophylaxis: SCDs Start: 04/24/22 0836 SCDs Start: 04/23/22 1259 warfarin (COUMADIN) tablet 6 mg  warfarin (COUMADIN) tablet 4 mg    Family Communication: None at bedside  Disposition Plan:   Status is: Inpatient for 6 weeks of IV antibiotics stop date 06/04/22.   Diet: Diet Order             DIET DYS 3 Room service appropriate? Yes; Fluid consistency: Thin  Diet effective now                    MEDICATIONS: Scheduled Meds:  Ferrous Fumarate  1 tablet Oral BID WC   And   vitamin B-12  100 mcg Oral BID WC   And   ascorbic acid  500 mg Oral BID WC   And   folic acid  1 mg Oral BID WC   aspirin EC  81 mg Oral Daily   bisacodyl  10 mg Oral Daily   Or   bisacodyl  10 mg Rectal Daily   Chlorhexidine Gluconate Cloth  6 each Topical Daily   docusate sodium  200 mg Oral BID   escitalopram  10 mg Oral Daily   feeding supplement  237 mL Oral BID BM   gabapentin  300 mg Oral TID   multivitamin with minerals  1 tablet Oral Daily   mupirocin ointment   Nasal BID   nicotine  14 mg Transdermal Daily   pantoprazole  40 mg Oral Daily   warfarin  6 mg Oral Once per day on Tue Wed   And   warfarin  4 mg Oral Once per day on Sun Mon Thu Fri Sat   Warfarin - Pharmacist Dosing Inpatient   Does not apply q1600   Continuous Infusions:  ceFTAROline (TEFLARO) IV 600 mg (06/02/22 0936)   DAPTOmycin (CUBICIN) 800 mg in sodium chloride 0.9 % IVPB 800 mg (06/01/22 2118)   promethazine (PHENERGAN) injection (IM or IVPB) Stopped (05/20/22 1020)   PRN Meds:.acetaminophen, alum & mag hydroxide-simeth, bisacodyl, diclofenac Sodium, hydrOXYzine, loperamide, methocarbamol, mouth rinse, polyethylene glycol, promethazine (PHENERGAN) injection (IM or IVPB), sodium chloride flush   I have personally reviewed following labs and imaging  studies  LABORATORY DATA:  Recent Labs  Lab 05/27/22 0452  WBC 4.7  HGB 10.2*  HCT 32.5*  PLT 176  MCV 83.8  MCH 26.3  MCHC 31.4  RDW 15.0  LYMPHSABS 1.6  MONOABS 0.5  EOSABS 0.4  BASOSABS 0.0    Recent Labs  Lab 05/27/22 0452 05/29/22 0423 05/31/22 0519  NA 140  --   --   K 4.2  --   --   CL 102  --   --   CO2 29  --   --   GLUCOSE 90  --   --   BUN 16  --   --   CREATININE 0.79  --   --   CALCIUM 9.5  --   --   AST 30  --   --   ALT 29  --   --   ALKPHOS 84  --   --   BILITOT 0.5  --   --   ALBUMIN 3.2*  --   --  MG 2.0  --   --   INR 2.6* 2.1* 2.5*     LOS: 97 days   Signature  Susa Raring M.D on 06/02/2022 at 9:55 AM   -  To page go to [email or web address]

## 2022-06-03 DIAGNOSIS — I079 Rheumatic tricuspid valve disease, unspecified: Secondary | ICD-10-CM | POA: Diagnosis not present

## 2022-06-03 LAB — SEDIMENTATION RATE: Sed Rate: 8 mm/hr (ref 0–16)

## 2022-06-03 LAB — CBC WITH DIFFERENTIAL/PLATELET
Abs Immature Granulocytes: 0.01 10*3/uL (ref 0.00–0.07)
Basophils Absolute: 0 10*3/uL (ref 0.0–0.1)
Basophils Relative: 1 %
Eosinophils Absolute: 0.4 10*3/uL (ref 0.0–0.5)
Eosinophils Relative: 7 %
HCT: 34.5 % — ABNORMAL LOW (ref 39.0–52.0)
Hemoglobin: 10.6 g/dL — ABNORMAL LOW (ref 13.0–17.0)
Immature Granulocytes: 0 %
Lymphocytes Relative: 34 %
Lymphs Abs: 1.7 10*3/uL (ref 0.7–4.0)
MCH: 25.9 pg — ABNORMAL LOW (ref 26.0–34.0)
MCHC: 30.7 g/dL (ref 30.0–36.0)
MCV: 84.1 fL (ref 80.0–100.0)
Monocytes Absolute: 0.5 10*3/uL (ref 0.1–1.0)
Monocytes Relative: 9 %
Neutro Abs: 2.4 10*3/uL (ref 1.7–7.7)
Neutrophils Relative %: 49 %
Platelets: 169 10*3/uL (ref 150–400)
RBC: 4.1 MIL/uL — ABNORMAL LOW (ref 4.22–5.81)
RDW: 15.8 % — ABNORMAL HIGH (ref 11.5–15.5)
WBC: 4.9 10*3/uL (ref 4.0–10.5)
nRBC: 0 % (ref 0.0–0.2)

## 2022-06-03 LAB — COMPREHENSIVE METABOLIC PANEL
ALT: 26 U/L (ref 0–44)
AST: 27 U/L (ref 15–41)
Albumin: 3.2 g/dL — ABNORMAL LOW (ref 3.5–5.0)
Alkaline Phosphatase: 81 U/L (ref 38–126)
Anion gap: 10 (ref 5–15)
BUN: 19 mg/dL (ref 6–20)
CO2: 27 mmol/L (ref 22–32)
Calcium: 9.5 mg/dL (ref 8.9–10.3)
Chloride: 101 mmol/L (ref 98–111)
Creatinine, Ser: 0.76 mg/dL (ref 0.61–1.24)
GFR, Estimated: >60 mL/min (ref >60)
Glucose, Bld: 102 mg/dL — ABNORMAL HIGH (ref 70–99)
Potassium: 4 mmol/L (ref 3.5–5.1)
Sodium: 138 mmol/L (ref 135–145)
Total Bilirubin: 0.4 mg/dL (ref 0.3–1.2)
Total Protein: 6.7 g/dL (ref 6.5–8.1)

## 2022-06-03 LAB — PROTIME-INR
INR: 2.2 — ABNORMAL HIGH (ref 0.8–1.2)
Prothrombin Time: 23.9 seconds — ABNORMAL HIGH (ref 11.4–15.2)

## 2022-06-03 LAB — MAGNESIUM: Magnesium: 2 mg/dL (ref 1.7–2.4)

## 2022-06-03 LAB — C-REACTIVE PROTEIN: CRP: 0.7 mg/dL (ref <1.0)

## 2022-06-03 LAB — CK: Total CK: 26 U/L — ABNORMAL LOW (ref 49–397)

## 2022-06-03 MED ORDER — ESCITALOPRAM OXALATE 20 MG PO TABS
20.0000 mg | ORAL_TABLET | Freq: Every day | ORAL | Status: DC
  Administered 2022-06-04: 20 mg via ORAL
  Filled 2022-06-03: qty 1

## 2022-06-03 NOTE — Progress Notes (Addendum)
Reginald Clarke for Infectious Disease    Date of Admission:  02/25/2022   Total days of antibiotics 41 since heart valve surgery          ID: Reginald Clarke is a 31 y.o. male with  MRSA/MSSA TV endocarditis s/p TV replacement on daptomycin/ceftaroline Principal Problem:   Endocarditis of tricuspid valve Active Problems:   MRSA bacteremia   Chronic pain syndrome/methadone abuse    Tobacco abuse   Substance use disorder   Hepatitis C, chronic ([name])   Anxiety   Normocytic anemia   Prolonged QT interval   Sepsis ([name])   Elevated serum creatinine   Hypokalemia   Hypomagnesemia   Hyponatremia   Acute bacterial endocarditis   MSSA bacteremia   IVDU (intravenous drug user)   Pneumonia of both lower lobes due to methicillin resistant Staphylococcus aureus (MRSA) ([name])   Thrombocytopenia ([place])   Encounter for preoperative dental examination   [place]   Retained tooth root   Chronic apical periodontitis   Chronic periodontitis   Accretions on teeth   Impacted third molar tooth   Teeth missing   Phobia of dental procedure   Shock ([place])   Acute respiratory failure ([place])   Pleural effusion    Subjective: No difficulty with antibiotics. Still feeling anxious. Looking forward to going home tomorrow. Medications:   Ferrous Fumarate  1 tablet Oral BID WC   And   vitamin B-12  100 mcg Oral BID WC   And   ascorbic acid  500 mg Oral BID WC   And   folic acid  1 mg Oral BID WC   aspirin EC  81 mg Oral Daily   bisacodyl  10 mg Oral Daily   Or   bisacodyl  10 mg Rectal Daily   Chlorhexidine Gluconate Cloth  6 each Topical Daily   docusate sodium  200 mg Oral BID   [START ON 06/04/2022] escitalopram  20 mg Oral Daily   feeding supplement  237 mL Oral BID BM   gabapentin  300 mg Oral TID   multivitamin with minerals  1 tablet Oral Daily   mupirocin ointment   Nasal BID   nicotine  14 mg Transdermal Daily   pantoprazole  40 mg Oral Daily   warfarin  6 mg Oral Once per day on  Tue Wed   And   warfarin  4 mg Oral Once per day on Sun Mon Thu Fri Sat   Warfarin - Pharmacist Dosing Inpatient   Does not apply q1600    Objective: Vital signs in last 24 hours: Temp:  [97.3 F (36.3 C)-97.9 F (36.6 C)] 97.7 F (36.5 C) (10/30 0900) Pulse Rate:  [63-69] 63 (10/30 0349) Resp:  [18] 18 (10/30 0900) BP: (99-102)/(46-58) 99/58 (10/30 0900) SpO2:  [90 %-92 %] 92 % (10/30 0349) Weight:  [39.6 kg-85.3 kg] 85.3 kg (10/30 1503) Physical Exam  Constitutional: He is oriented to person, place, and time. He appears well-developed and well-nourished. No distress.  HENT:  Mouth/Throat: Oropharynx is clear and moist. No oropharyngeal exudate.  Cardiovascular: Normal rate, regular rhythm and normal heart sounds. Exam reveals no gallop and no friction rub.  No murmur heard.  Pulmonary/Chest: Effort normal and breath sounds normal. No respiratory distress. He has no wheezes.  Abdominal: Soft. Bowel sounds are normal. He exhibits no distension. There is no tenderness.  Ext: picc line is c/d/i Neurological: He is alert and oriented to person, place, and time.  Skin: Skin is  warm and dry. No rash noted. No erythema.  Psychiatric: He has a normal mood and affect. His behavior is normal.    Lab Results Recent Labs    06/03/22 0233  WBC 4.9  HGB 10.6*  HCT 34.5*  NA 138  K 4.0  CL 101  CO2 27  BUN 19  CREATININE 0.76   Liver Panel Recent Labs    06/03/22 0233  PROT 6.7  ALBUMIN 3.2*  AST 27  ALT 26  ALKPHOS 81  BILITOT 0.4   Lab Results  Component Value Date   ESRSEDRATE 49 (H) 12/26/2021     Microbiology: 9/19 MSSA MICS are expressed in micrograms per mL    Antibiotic                 RSLT#1    RSLT#2    RSLT#3    RSLT#4  Ciprofloxacin                  S  Clindamycin                    S  Erythromycin                   R  Gentamicin                     S  Levofloxacin                   S  Linezolid                      S  Oxacillin                       S  Penicillin                     R  Rifampin                       S  Tetracycline                   S  Trimethoprim/Sulfa             S  Vancomycin                     S  Studies/Results: No results found.   Assessment/Plan: MRSA/MSSA TV endocarditis s/p TV replacement = finishes 6 wk of dapto/ceftaroline tomorrow at 5pm. We will check sed rate and crp. If not back to baseline, recommend to give oral abtx suppression with doxycycline 100mg  PO BID (take on full stomach) for 1 month  MRSA decolonization = for discharge, please have TOC pharmacy give CHG bathing soap. Do daily CHG bath x 5 days plus mupirocin intra-nasal BID x 5 days, every other week x 6 months.  Anxiety = increased his escitalopram dosing to 20mg  per day. Also encouraged him to use hydroxyzine 25mg  if symptomatic up to TID.   Will arrange follow up for patient in our clinic in 2-4 wk for close follow up. Also would touch base with CT surgery if any further follow up needed on their end.   I have personally spent 50 minutes involved in face-to-face and non-face-to-face activities for this patient on the day of the visit. Professional time spent includes the following activities: Preparing to see the patient (review of tests), Obtaining and/or reviewing separately obtained  history (admission/discharge record), Performing a medically appropriate examination and/or evaluation , Ordering medications/tests/procedures, referring and communicating with other health care professionals, Documenting clinical information in the EMR, Independently interpreting results (not separately reported), Communicating results to the patient/family/caregiver, Counseling and educating the patient/family/caregiver and Care coordination (not separately reported).   [facility] for Infectious Diseases Pager: [phone]  06/03/2022, 3:07 PM

## 2022-06-03 NOTE — TOC Progression Note (Signed)
Transition of Care [facility]) - Progression Note    Patient Details  Name: Reginald Clarke MRN: [id number] Date of Birth: [birth year]  [facility]) CM/SW Contact  Loletha Grayer Beverely Pace, RN Phone Number: 06/03/2022, 1:29 PM  Clinical Narrative:    Appointment has been arranged for patient at the Coumadin Clinic for Monday, June 10, 2022 at 2:30pm, [address] [phone]. CM will add to AVS.     Expected Discharge Plan: Home/Self Care Barriers to Discharge: Continued Medical Work up  Expected Discharge Plan and Services Expected Discharge Plan: Home/Self Care   Discharge Planning Services: Follow-up appt scheduled   Living arrangements for the past 2 months: Single Family Home                 DME Arranged: N/A         HH Arranged: NA           Social Determinants of Health (SDOH) Interventions    Readmission Risk Interventions    02/26/2022   10:07 AM  Readmission Risk Prevention Plan  Transportation Screening Complete  PCP or Specialist Appt within 3-5 Days Complete  HRI or Home Care Consult Complete  Palliative Care Screening Not Applicable

## 2022-06-03 NOTE — Progress Notes (Signed)
[facility] for Warfarin Indication:  tricuspid valve repair  No Known Allergies  Patient Measurements: Height: 5\' 10"  (177.8 cm) Weight: 39.6 kg (87 lb 4.8 oz) IBW/kg (Calculated) : 73  Vital Signs: Temp: 97.8 F (36.6 C) (10/30 0349) Temp Source: Oral (10/30 0349) BP: 102/55 (10/30 0349) Pulse Rate: 63 (10/30 0349)  Labs: Recent Labs    06/03/22 0233  HGB 10.6*  HCT 34.5*  PLT 169  LABPROT 23.9*  INR 2.2*  CREATININE 0.76  CKTOTAL 26*    Estimated Creatinine Clearance: 74.9 mL/min (by C-G formula based on SCr of 0.76 mg/dL).  Assessment: Reginald Clarke is a 31 yr old male s/p tricuspid valve repair on 04/23/22. Warfarin initiated post-op with plan to treat x 3 months per CT surgery.    Patient continues on ceftaroline and daptomycin through 10/31 (broad spectrum antibiotics may lead to increased INR).  May need higher dose once off IV antibiotics.  INR remains at goal at 2.2 on Warfarin regimen of 6mg  Tuesday and Wednesday and 4mg  daily all other days. CBC stable (10/30). No bleeding reported.   Goal of Therapy:  INR 2-3 Monitor platelets by anticoagulation protocol: Yes   Plan:  Continue Warfarin 6 mg everyTue & Wed and 4mg  every MThFSS   Monitor INR qMTH   Continue to monitor H&H and platelets   Thank you for allowing pharmacy to be a part of this patient's care.   Vaughan Basta BS, PharmD, BCPS Clinical Pharmacist 06/03/2022 8:26 AM  Contact: [phone] after 3 PM  "Be curious, not judgmental..." -Jamal Maes

## 2022-06-03 NOTE — Progress Notes (Signed)
PROGRESS NOTE        PATIENT DETAILS Name: Reginald Clarke Age: 31 y.o. Sex: male Date of Birth: 1991/02/04 Admit Date: 02/25/2022 Admitting Physician Dorcas Carrow, MD [id number], [name], PA-C  Brief Summary: Patient is a 31 y.o.  male with history of recurrent MSSA native tricuspid valve endocarditis in spite of completing several courses of IV antibiotics.  Evaluated by ID/CT surgery-underwent tricuspid valve debridement/repair/annuloplasty on 9/19.  Significant events: 4/12-5/10>> hospitalization for MSSA bacteremia with native TV endocarditis-angio vac procedure on 4/26.Completed cefazolin on 5/10-planned weekly dalbavancin at the ID clinic.   5/22>> outpatient blood culture positive for MSSA 5/24-6/8>> hospitalization [facility] for persistent bacteremia-worsening back pain-discharge to [facility] on IV Ancef. 7/5>> discharged from [facility] on oral cefdinir.  Continued to have intermittent fever postdischarge.   7/24>> admit for-persistent fever/nausea/vomiting/diarrhea-blood cultures positive for MSSA. 9/5>> recurrent fever- blood cultures growing Staph aureus  9/5 >> PICC line discontinued 9/19>> debridement of tricuspid valve/valve reconstruction/annuloplasty 9/27>> transfer to [facility]  Significant studies: 7/25>> CT chest: Multifocal airspace opacities-increasing cavitary nodules 7/25>> MRI thoracic spine: No evidence of infection in thoracic spine.  Significant microbiology data: 7/24>> urine culture: Negative 7/25>> blood culture: 1/2-MSSA. 7/27>> blood culture: No growth 7/29>> blood culture: No growth 9/04>> blood cultures:MRSA 9/07>> blood cultures:neg 9/19>> tricuspid valve tissue culture: Staph aureus-sensitivities pending  Procedures: 8/3>> TEE: 3 vegetations on tricuspid valve-severe TR.  EF 60-65% 9/14>> multiple teeth extraction 9/19>> debridement of tricuspid valve/valve reconstruction/annuloplasty  Consults: ID CT surgery Dentist    Subjective:  Patient in bed, appears comfortable, denies any headache, no fever, no chest pain or pressure, no shortness of breath , no abdominal pain. No new focal weakness.   Objective:  Vitals:   06/02/22 2338 06/03/22 0349 06/03/22 0500 06/03/22 0900  BP: (!) 101/46 (!) 102/55  (!) 99/58  Pulse: 67 63    Temp: (!) 97.3 F (36.3 C) 97.8 F (36.6 C)  97.7 F (36.5 C)  Resp: 18 18  18   Height:      Weight:   39.6 kg   SpO2: 90% 92%    TempSrc: Oral Oral  Oral  BMI (Calculated):         Exam:  Awake Alert, No new F.N deficits, Normal affect [place].AT,PERRAL Supple Neck, No JVD,   Symmetrical Chest wall movement, Good air movement bilaterally, CTAB RRR,No Gallops, Rubs or new Murmurs,  +ve B.Sounds, Abd Soft, No tenderness,   No Cyanosis, Clubbing or edema    Assessment/Plan:  Sepsis due to recurrent MSSA bacteremia with native tricuspid valve endocarditis-s/p valve repair/annuloplasty on 04/23/22 -  Sepsis physiology resolved-discussed with infectious disease MD on 9/28 Dr. 10/28 - needs 6 weeks of Teflaro/daptomycin stop date 06/04/22 - this will unfortunately have to be done as an inpatient.  Goal INR 2-3-pharmacy now managing Coumadin.  Per CT surgery note-needs Coumadin for 3 months from valve repair. Coumadin Stop date 07/23/2022.  Normocytic anemia:  Due to chronic disease/recurrent endocarditis: No indication for transfusion.  Anxiety -  Stable-on Lexapro  Methamphetamine use  In remission  Chronic methadone use - has been tapered off last dose on 06/02/2022.  Chronic HCV   - outpatient follow-up with ID.  Mild transaminitis resolved  Nutrition Status: Nutrition Problem: Inadequate oral intake Etiology: vomiting, nausea Signs/Symptoms: per patient/family report Interventions: Ensure Enlive (each supplement provides 350kcal and 20 grams of protein), MVI,  Snacks  BMI: Estimated body mass index is 12.53 kg/m as calculated from the following:   Height as  of this encounter: 5\' 10"  (1.778 m).   Weight as of this encounter: 39.6 kg.   Code status:   Code Status: Full Code   DVT Prophylaxis: SCDs Start: 04/24/22 0836 SCDs Start: 04/23/22 1259 warfarin (COUMADIN) tablet 6 mg  warfarin (COUMADIN) tablet 4 mg    Family Communication: None at bedside  Disposition Plan:   Status is: Inpatient for 6 weeks of IV antibiotics stop date 06/04/22.   Diet: Diet Order             DIET DYS 3 Room service appropriate? Yes; Fluid consistency: Thin  Diet effective now                    MEDICATIONS: Scheduled Meds:  Ferrous Fumarate  1 tablet Oral BID WC   And   vitamin B-12  100 mcg Oral BID WC   And   ascorbic acid  500 mg Oral BID WC   And   folic acid  1 mg Oral BID WC   aspirin EC  81 mg Oral Daily   bisacodyl  10 mg Oral Daily   Or   bisacodyl  10 mg Rectal Daily   Chlorhexidine Gluconate Cloth  6 each Topical Daily   docusate sodium  200 mg Oral BID   escitalopram  10 mg Oral Daily   feeding supplement  237 mL Oral BID BM   gabapentin  300 mg Oral TID   multivitamin with minerals  1 tablet Oral Daily   mupirocin ointment   Nasal BID   nicotine  14 mg Transdermal Daily   pantoprazole  40 mg Oral Daily   warfarin  6 mg Oral Once per day on Tue Wed   And   warfarin  4 mg Oral Once per day on Sun Mon Thu Fri Sat   Warfarin - Pharmacist Dosing Inpatient   Does not apply q1600   Continuous Infusions:  ceFTAROline (TEFLARO) IV 600 mg (06/03/22 0905)   DAPTOmycin (CUBICIN) 800 mg in sodium chloride 0.9 % IVPB 800 mg (06/02/22 2013)   promethazine (PHENERGAN) injection (IM or IVPB) Stopped (05/20/22 1020)   PRN Meds:.acetaminophen, alum & mag hydroxide-simeth, bisacodyl, diclofenac Sodium, hydrOXYzine, loperamide, methocarbamol, mouth rinse, polyethylene glycol, promethazine (PHENERGAN) injection (IM or IVPB), sodium chloride flush   I have personally reviewed following labs and imaging studies  LABORATORY DATA:  Recent  Labs  Lab 06/03/22 0233  WBC 4.9  HGB 10.6*  HCT 34.5*  PLT 169  MCV 84.1  MCH 25.9*  MCHC 30.7  RDW 15.8*  LYMPHSABS 1.7  MONOABS 0.5  EOSABS 0.4  BASOSABS 0.0    Recent Labs  Lab 05/29/22 0423 05/31/22 0519 06/03/22 0233  NA  --   --  138  K  --   --  4.0  CL  --   --  101  CO2  --   --  27  GLUCOSE  --   --  102*  BUN  --   --  19  CREATININE  --   --  0.76  CALCIUM  --   --  9.5  AST  --   --  27  ALT  --   --  26  ALKPHOS  --   --  81  BILITOT  --   --  0.4  ALBUMIN  --   --  3.2*  MG  --   --  2.0  INR 2.1* 2.5* 2.2*     LOS: 98 days   Signature  Susa Raring M.D on 06/03/2022 at 10:51 AM   -  To page go to [email or web address]

## 2022-06-04 ENCOUNTER — Other Ambulatory Visit (HOSPITAL_COMMUNITY): Payer: Self-pay

## 2022-06-04 DIAGNOSIS — I079 Rheumatic tricuspid valve disease, unspecified: Secondary | ICD-10-CM | POA: Diagnosis not present

## 2022-06-04 MED ORDER — WARFARIN SODIUM 6 MG PO TABS
ORAL_TABLET | ORAL | 0 refills | Status: DC
  Filled 2022-06-04: qty 10, 30d supply, fill #0

## 2022-06-04 MED ORDER — CHLORHEXIDINE GLUCONATE 2 % EX SOLN: 1.0000 | Freq: Every day | CUTANEOUS | 11 refills | Status: DC

## 2022-06-04 MED ORDER — DOXYCYCLINE HYCLATE 100 MG PO TABS
100.0000 mg | ORAL_TABLET | Freq: Two times a day (BID) | ORAL | 0 refills | Status: DC
  Filled 2022-06-04: qty 60, 30d supply, fill #0

## 2022-06-04 MED ORDER — FOLIC ACID 1 MG PO TABS
1.0000 mg | ORAL_TABLET | Freq: Two times a day (BID) | ORAL | 0 refills | Status: DC
  Filled 2022-06-04: qty 30, 15d supply, fill #0

## 2022-06-04 MED ORDER — CYANOCOBALAMIN 1000 MCG PO TABS
1000.0000 ug | ORAL_TABLET | Freq: Two times a day (BID) | ORAL | 0 refills | Status: DC
  Filled 2022-06-04: qty 30, 15d supply, fill #0

## 2022-06-04 MED ORDER — WARFARIN SODIUM 4 MG PO TABS
ORAL_TABLET | ORAL | 0 refills | Status: DC
  Filled 2022-06-04: qty 25, 30d supply, fill #0

## 2022-06-04 MED ORDER — ASCORBIC ACID 500 MG PO TABS
500.0000 mg | ORAL_TABLET | Freq: Two times a day (BID) | ORAL | 0 refills | Status: DC
  Filled 2022-06-04: qty 30, 15d supply, fill #0

## 2022-06-04 MED ORDER — MUPIROCIN 2 % EX OINT
1.0000 | TOPICAL_OINTMENT | Freq: Two times a day (BID) | CUTANEOUS | 11 refills | Status: DC
  Filled 2022-06-04: qty 22, 30d supply, fill #0

## 2022-06-04 MED ORDER — NICOTINE 14 MG/24HR TD PT24
14.0000 mg | MEDICATED_PATCH | Freq: Every day | TRANSDERMAL | 0 refills | Status: DC
  Filled 2022-06-04: qty 28, 28d supply, fill #0

## 2022-06-04 MED ORDER — GABAPENTIN 300 MG PO CAPS
300.0000 mg | ORAL_CAPSULE | Freq: Three times a day (TID) | ORAL | 0 refills | Status: DC
  Filled 2022-06-04: qty 90, 30d supply, fill #0

## 2022-06-04 MED ORDER — ESCITALOPRAM OXALATE 20 MG PO TABS
20.0000 mg | ORAL_TABLET | Freq: Every day | ORAL | 0 refills | Status: DC
  Filled 2022-06-04: qty 30, 30d supply, fill #0

## 2022-06-04 MED ORDER — FERROUS FUMARATE 324 (106 FE) MG PO TABS
1.0000 | ORAL_TABLET | Freq: Every day | ORAL | 0 refills | Status: DC
  Filled 2022-06-04: qty 30, 30d supply, fill #0

## 2022-06-04 NOTE — TOC Transition Note (Signed)
Transition of Care [facility]) - CM/SW Discharge Note   Patient Details  Name: Reginald Clarke MRN: [id number] Date of Birth: 06/06/1991  [facility]) CM/SW Contact:  Benard Halsted, LCSW Phone Number: 06/04/2022, 4:01 PM   Clinical Narrative:    CSW faxed DC Summary and referral form that was on patient's hard chart to [facility] as requested so patient can follow up to establish care there (f. [phone]). Patient has a ride home. No other needs identified.    Final next level of care: Home/Self Care Barriers to Discharge: Barriers Resolved   Patient Goals and CMS Choice Patient states their goals for this hospitalization and ongoing recovery are:: to return to home      Discharge Placement                       Discharge Plan and Services   Discharge Planning Services: Follow-up appt scheduled            DME Arranged: N/A         HH Arranged: NA          Social Determinants of Health (SDOH) Interventions     Readmission Risk Interventions    02/26/2022   10:07 AM  Readmission Risk Prevention Plan  Transportation Screening Complete  PCP or Specialist Appt within 3-5 Days Complete  HRI or Home Care Consult Complete  Palliative Care Screening Not Applicable

## 2022-06-04 NOTE — Discharge Summary (Signed)
Reginald Clarke IRW:[id number] DOB: [birth year] DOA: 02/25/2022  PCP: Fredirick Lathe, PA-C  Admit date: 02/25/2022  Discharge date: 06/04/2022  Admitted From: Home   Disposition:  Home   Recommendations for Outpatient Follow-up:   Follow up with PCP in 1-2 weeks  PCP Please obtain BMP/CBC, 2 view CXR in 1week,  (see Discharge instructions)   PCP Please follow up on the following pending results:    Home Health: None   Equipment/Devices: None  Consultations: None  Discharge Condition: Stable    CODE STATUS: Full    Diet Recommendation: Heart Healthy   Diet Order             Diet - low sodium heart healthy           DIET DYS 3 Room service appropriate? Yes; Fluid consistency: Thin  Diet effective now                    Chief Complaint  Patient presents with   Nausea   Emesis     Brief history of present illness from the day of admission and additional interim summary     31 y.o.  male with history of recurrent MSSA native tricuspid valve endocarditis in spite of completing several courses of IV antibiotics.  Evaluated by ID/CT surgery-underwent tricuspid valve debridement/repair/annuloplasty on 9/19.   Significant events: 4/12-5/10>> hospitalization for MSSA bacteremia with native TV endocarditis-angio vac procedure on 4/26.Completed cefazolin on 5/10-planned weekly dalbavancin at the ID clinic.   5/22>> outpatient blood culture positive for MSSA 5/24-6/8>> hospitalization [facility] for persistent bacteremia-worsening back pain-discharge to [facility] on IV Ancef. 7/5>> discharged from [facility] on oral cefdinir.  Continued to have intermittent fever postdischarge.   7/24>> admit for-persistent fever/nausea/vomiting/diarrhea-blood cultures positive for MSSA. 9/5>> recurrent fever- blood cultures growing  Staph aureus  9/5 >> PICC line discontinued 9/19>> debridement of tricuspid valve/valve reconstruction/annuloplasty 9/27>> transfer to [facility]   Significant studies: 7/25>> CT chest: Multifocal airspace opacities-increasing cavitary nodules 7/25>> MRI thoracic spine: No evidence of infection in thoracic spine.   Significant microbiology data: 7/24>> urine culture: Negative 7/25>> blood culture: 1/2-MSSA. 7/27>> blood culture: No growth 7/29>> blood culture: No growth 9/04>> blood cultures:MRSA 9/07>> blood cultures:neg 9/19>> tricuspid valve tissue culture: Staph aureus-sensitivities pending   Procedures: 8/3>> TEE: 3 vegetations on tricuspid valve-severe TR.  EF 60-65% 9/14>> multiple teeth extraction 9/19>> debridement of tricuspid valve/valve reconstruction/annuloplasty   Consults: ID CT surgery Dentist                                                                  Hospital Course    Sepsis due to recurrent MSSA bacteremia with native tricuspid valve endocarditis-s/p valve repair/annuloplasty on 04/23/22 -  Sepsis physiology resolved-discussed with infectious disease MD on 9/28 Dr. Juleen China - needs  6 weeks of Teflaro/daptomycin stop date 06/04/22 -Case discussed with ID Dr. Graylon Good on 06/04/2022 will get 4 weeks of doxycycline oral as well upon discharge with outpatient follow-up with ID and cardiothoracic surgery.  Post tricuspid valve repair he was placed on Coumadin per CT surgery, he will be following with outpatient PCP and Coumadin clinic for INR monitoring, per CT surgery note-needs Coumadin for 3 months from valve repair. Coumadin Stop date 07/23/2022.   Normocytic anemia:  Due to chronic disease/recurrent endocarditis: No indication for transfusion.   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 Symptom free.   Chronic HCV   - outpatient follow-up with ID.   Mild transaminitis  resolved   Discharge diagnosis     Principal Problem:   Endocarditis of tricuspid valve Active Problems:   Sepsis ([place])   MRSA bacteremia   MSSA bacteremia   Acute bacterial endocarditis   Chronic pain syndrome/methadone abuse    Tobacco abuse   Substance use disorder   Hepatitis C, chronic ([name])   Anxiety   Normocytic anemia   Prolonged QT interval   Elevated serum creatinine   Hypokalemia   Hypomagnesemia   Hyponatremia   IVDU (intravenous drug user)   Pneumonia of both lower lobes due to methicillin resistant Staphylococcus aureus (MRSA) ([name])   Thrombocytopenia ([place])   Encounter for preoperative dental examination   [place]   Retained tooth root   Chronic apical periodontitis   Chronic periodontitis   Accretions on teeth   Impacted third molar tooth   Teeth missing   Phobia of dental procedure   Shock ([place])   Acute respiratory failure ([name])   Pleural effusion    Discharge instructions    Discharge Instructions     Amb Referral to Cardiac Rehabilitation   Complete by: As directed    Diagnosis: Valve Repair   Valve: Tricuspid   After initial evaluation and assessments completed: Virtual Based Care may be provided alone or in conjunction with Phase 2 Cardiac Rehab based on patient barriers.: Yes   Intensive Cardiac Rehabilitation (ICR) [place] location only OR Traditional Cardiac Rehabilitation (TCR) *If criteria for ICR are not met will enroll in TCR [facility] only): Yes   Diet - low sodium heart healthy   Complete by: As directed    Discharge instructions   Complete by: As directed    Follow with Primary MD Allwardt, Alyssa M, PA-C in 2-3 days   Get CBC, CMP, B12, TSH, INR -  checked next visit within 2-3 days by Primary MD   Activity: As tolerated with Full fall precautions use walker/cane & assistance as needed  Disposition Home   Diet: Heart Healthy    Special Instructions: If you have smoked or chewed Tobacco  in the last 2 yrs please stop smoking, stop  any regular Alcohol  and or any Recreational drug use.  On your next visit with your primary care physician please Get Medicines reviewed and adjusted.  Please request your Prim.MD to go over all Hospital Tests and Procedure/Radiological results at the follow up, please get all Hospital records sent to your Prim MD by signing hospital release before you go home.  If you experience worsening of your admission symptoms, develop shortness of breath, life threatening emergency, suicidal or homicidal thoughts you must seek medical attention immediately by calling 911 or calling your MD immediately  if symptoms less severe.  You Must read complete instructions/literature along with all the possible adverse reactions/side effects for all the Medicines you take and that have been prescribed to you. Take any new Medicines after you have completely understood and accpet all the possible adverse reactions/side effects.   Increase activity slowly   Complete by: As directed    No wound care   Complete by: As directed    Urine Culture   Complete by: As directed        Discharge Medications   Allergies as of 06/04/2022   No Known Allergies      Medication List     STOP taking these medications    ALPRAZolam 0.25 MG tablet Commonly known as: XANAX   ibuprofen 600 MG tablet Commonly known as: ADVIL   methadone 1 MG/1ML solution Commonly known as: DOLOPHINE       TAKE these medications    acetaminophen 650 MG CR tablet Commonly known as: TYLENOL Take 650 mg by mouth every 8 (eight) hours as needed for pain.   ascorbic acid 500 MG tablet Commonly known as: VITAMIN C Take 1 tablet (500 mg total) by mouth 2 (two) times daily with a meal.   Chlorhexidine Gluconate 2 % Soln Apply 1 Application topically daily. Apply chlorhexidine daily for 5 days every other week for 6 months to remove bacterial colonization. Do NOT wipe off.  Allow to air dry.   cyanocobalamin 1000 MCG tablet Take  1 tablet (1,000 mcg total) by mouth 2 (two) times daily with a meal.   doxycycline 100 MG tablet Commonly known as: VIBRA-TABS Take 1 tablet (100 mg total) by mouth 2 (two) times daily. Take with meals.   escitalopram 20 MG tablet Commonly known as: LEXAPRO Take 1 tablet (20 mg total) by mouth daily.   Ferrous Fumarate 324 (106 Fe) MG Tabs tablet Commonly known as: HEMOCYTE - 106 mg FE Take 1 tablet (106 mg of iron total) by mouth daily.   folic acid 1 MG tablet Commonly known as: FOLVITE Take 1 tablet (1 mg total) by mouth 2 (two) times daily with a meal.   gabapentin 300 MG capsule Commonly known as: NEURONTIN Take 1 capsule (300 mg total) by mouth 3 (three) times daily.   methocarbamol 500 MG tablet Commonly known as: ROBAXIN Take 1 tablet (500 mg total) by mouth every 8 (eight) hours as needed for muscle spasms.   mupirocin ointment 2 % Commonly known as: BACTROBAN Apply into the nose. Repeat every other week for 6 months. With cotton tip swab, apply pea sized amount into each nostril 2 (two) times daily for 5 days.  Gently press sides of nose together to spread oint. AVOID contact with eyes.   nicotine 14 mg/24hr patch Commonly known as: NICODERM CQ - dosed in mg/24 hours Place 1 patch (14 mg total) onto the skin daily.   ondansetron 4 MG tablet Commonly known as: ZOFRAN Take 4 mg by mouth every 8 (eight) hours as needed.   pantoprazole 40 MG tablet Commonly known as: PROTONIX Take 1 tablet (40 mg total) by mouth daily.   warfarin 6 MG tablet Commonly known as: COUMADIN Once per day on Tue and Wed   warfarin 4 MG tablet Commonly known as: COUMADIN Once per day on Sun, Mon, Thu, Fri, Sat Start taking on: June 06, 2022         Follow-up Information     [place], Elbert, DMD. Call in 1 week(s).   Specialty: Dentistry Why: As  needed, For suture removal Contact information: [place] [address] [phone]         Coumadin  Clinic. Go to.   Why: Your appointment at the Coumadin Cloinic is schedued for Monday, November 6,2023 at 2:30pm Contact information: [place]        Allwardt, Randa Evens, PA-C. Schedule an appointment as soon as possible for a visit in 2 day(s).   Specialty: Physician Assistant Contact information: [place] [address] [phone]         Lajuana Matte, MD. Schedule an appointment as soon as possible for a visit in 2 week(s).   Specialty: Cardiothoracic Surgery Contact information: [place] [address] [phone]                 Major procedures and Radiology Reports - PLEASE review detailed and final reports thoroughly  -        Subjective    Reginald Clarke today has no headache,no chest abdominal pain,no new weakness tingling or numbness, feels much better wants to go home today.    Objective   Blood pressure 118/65, pulse 82, temperature 97.8 F (36.6 C), temperature source Oral, resp. rate 18, height _0  (1.778 m), weight 85.3 kg, SpO2 95 %.  No intake or output data in the 24 hours ending 06/04/22 1002  Exam  Awake Alert, No new F.N deficits,    [place].AT,PERRAL Supple Neck,   Symmetrical Chest wall movement, Good air movement bilaterally, CTAB RRR,No Gallops,   +ve B.Sounds, Abd Soft, Non tender,  No Cyanosis, Clubbing or edema    Data Review   Recent Labs  Lab 06/03/22 0233  WBC 4.9  HGB 10.6*  HCT 34.5*  PLT 169  MCV 84.1  MCH 25.9*  MCHC 30.7  RDW 15.8*  LYMPHSABS 1.7  MONOABS 0.5  EOSABS 0.4  BASOSABS 0.0    Recent Labs  Lab 05/29/22 0423 05/31/22 0519 06/03/22 0233 06/03/22 1808  NA  --   --  138  --   K  --   --  4.0  --   CL  --   --  101  --   CO2  --   --  27  --   GLUCOSE  --   --  102*  --   BUN  --   --  19  --   CREATININE  --   --  0.76  --   CALCIUM  --   --  9.5  --   AST  --   --  27  --   ALT  --   --  26   --   ALKPHOS  --   --  81  --   BILITOT  --   --  0.4  --   ALBUMIN  --   --  3.2*  --   MG  --   --  2.0  --   CRP  --   --   --  0.7  INR 2.1* 2.5* 2.2*  --     Total Time in preparing paper work, data evaluation and todays exam - 35 minutes  Lala Lund M.D on 06/04/2022 at 10:02 AM  [facility]

## 2022-06-04 NOTE — Progress Notes (Signed)
[place] Spagnolo to be D/C'd Home per MD order.  Discussed with the patient and all questions fully answered.  VSS, Skin clean, dry and intact without evidence of skin break down, no evidence of skin tears noted. IV catheter discontinued intact. Site without signs and symptoms of complications. Dressing and pressure applied.  An After Visit Summary was printed and given to the patient. Patient received prescription.  D/c education completed with patient/family including follow up instructions, medication list, d/c activities limitations if indicated, with other d/c instructions as indicated by MD - patient able to verbalize understanding, all questions fully answered.   Patient instructed to return to ED, call 911, or call MD for any changes in condition.   Patient escorted via [place], and D/C home via private auto.  Lillia Abed Terena Bohan 06/04/2022 4:36 PM

## 2022-06-05 ENCOUNTER — Telehealth: Payer: Self-pay

## 2022-06-05 NOTE — Telephone Encounter (Signed)
Transition Care Management Follow-up Telephone Call Date of discharge and from where: [facility] 06/04/2022 How have you been since you were released from the hospital? better Any questions or concerns? No  Items Reviewed: Did the pt receive and understand the discharge instructions provided? Yes  Medications obtained and verified? Yes  Other? No  Any new allergies since your discharge? No  Dietary orders reviewed? Yes Do you have support at home? Yes   Home Care and Equipment/Supplies: Were home health services ordered? no If so, what is the name of the agency? N/a  Has the agency set up a time to come to the patient's home? not applicable Were any new equipment or medical supplies ordered?  No What is the name of the medical supply agency? N/a Were you able to get the supplies/equipment? no Do you have any questions related to the use of the equipment or supplies? No  Functional Questionnaire: (I = Independent and D = Dependent) ADLs: I  Bathing/Dressing- I  Meal Prep- I  Eating- I  Maintaining continence- I  Transferring/Ambulation- I  Managing Meds- I  Follow up appointments reviewed:  PCP Hospital f/u appt confirmed? Yes  Scheduled to see Alyssa Allwardt on 06/07/2022 @ 9:20. [place] [facility] f/u appt confirmed? No   Are transportation arrangements needed? No  If their condition worsens, is the pt aware to call PCP or go to the Emergency Dept.? Yes Was the patient provided with contact information for the PCP's office or ED? Yes Was to pt encouraged to call back with questions or concerns? Yes Juanda Crumble, LPN [place] Direct Dial [phone]

## 2022-06-07 ENCOUNTER — Ambulatory Visit (INDEPENDENT_AMBULATORY_CARE_PROVIDER_SITE_OTHER): Payer: BLUE CROSS/BLUE SHIELD | Admitting: Internal Medicine

## 2022-06-07 ENCOUNTER — Encounter: Payer: Self-pay | Admitting: Internal Medicine

## 2022-06-07 VITALS — BP 125/75 | HR 82 | Temp 98.3°F | Resp 14 | Ht 70.0 in | Wt 184.8 lb

## 2022-06-07 DIAGNOSIS — F1120 Opioid dependence, uncomplicated: Secondary | ICD-10-CM | POA: Diagnosis not present

## 2022-06-07 DIAGNOSIS — F1193 Opioid use, unspecified with withdrawal: Secondary | ICD-10-CM

## 2022-06-07 LAB — ACID FAST CULTURE WITH REFLEXED SENSITIVITIES (MYCOBACTERIA): Acid Fast Culture: NEGATIVE

## 2022-06-07 MED ORDER — BUPRENORPHINE HCL-NALOXONE HCL 8-2 MG SL SUBL: 1.0000 | SUBLINGUAL_TABLET | Freq: Two times a day (BID) | SUBLINGUAL | 0 refills | Status: DC

## 2022-06-07 MED ORDER — QUETIAPINE FUMARATE 100 MG PO TABS: 100.0000 mg | ORAL_TABLET | Freq: Every day | ORAL | 2 refills | Status: DC

## 2022-06-07 NOTE — Patient Instructions (Signed)
It was a pleasure seeing you today! I truly hope you feel like you received 5 star service and please let me know if there is anything I can improve.  Loralee Pacas, MD   Today the plan is...  Methadone dependence ([name]) -     QUEtiapine Fumarate; Take 1 tablet (100 mg total) by mouth at bedtime.  Dispense: 30 tablet; Refill: 2 -     BUPRENORPHINE + Drug Screen, Ur -     Buprenorphine HCl-Naloxone HCl; Place 1 tablet under the tongue in the morning and at bedtime.  Dispense: 10 tablet; Refill: 0  Methadone withdrawal ([name])       [x]  No follow-ups on file.   [x]  If your condition begins to worsen or become severe:  GO to the ER  [x]  If you are not doing well: RETURN to the office sooner  [x]  If you have follow-up questions / concerns: please contact me  -via phone [phone] OR MyChart messaging   [x]  Please bring all your medicines to each appointment

## 2022-06-07 NOTE — Progress Notes (Signed)
Today's healthcare provider: Lula Olszewski, MD  Phone: [phone]  New patient visit  Visit Date: 06/07/2022 Patient: Reginald Clarke   DOB: 11-Mar-1991   31 y.o. Male  MRN: [id number]  Assessment and Plan:   I feel grateful for the opportunity to assist in the care of Reginald Clarke.  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 He presents with main concern of anxiety that he has managed with xanax but seems main problem is methadone withdrawal and would be best to resume at least suboxone now to manage, and due to xanax interaction, use seroquel for anxiety.   Ideally he can get in with a methadone clinic soon but I will try to bridge with suboxone for now. He is currently not in any rehab program.  Reginald was seen today for transfer of care and discuss medications.  Methadone dependence ([name]) -     QUEtiapine Fumarate; Take 1 tablet (100 mg total) by mouth at bedtime.  Dispense: 30 tablet; Refill: 2 -     BUPRENORPHINE + Drug Screen, Ur -     Buprenorphine HCl-Naloxone HCl; Place 1 tablet under the tongue in the morning and at bedtime.  Dispense: 10 tablet; Refill: 0  Methadone withdrawal [facility])   Health Maintenance  Topic Date Due   TETANUS/TDAP  01/08/2031   Hepatitis C Screening  Completed   HIV Screening  Completed   HPV VACCINES  Aged Out   INFLUENZA VACCINE  Discontinued   COVID-19 Vaccine  Discontinued   Reginald Clarke has been through 6 months of health in the hospital with endocarditis and heart surgery reporting no methamphetamine use for full 6 months and wants to stop it and never restarted.  However 6 weeks ago he had to come off methadone because his methadone clinic closed.  He has been having extreme anxiety and bone pain which has been helped by Xanax.  He denies using any alcohol at all in the last years.  His being a little weed.   He reports severe anxiety that is been helped by  Xanax but notes that he stopped the methadone just 6 weeks ago [place] when the clinic shutdown and he could not come to the new clinic in [place] it is too hard because he cannot drive to get there and it is a 35-minute drive that he would have to make every day and he would need someone to take him.  He reports in addition to the severe anxiety he has also has the bone pain from the withdrawal.   This is the most pressing concern- he has only been out of hospital 3 days and this is challenging.   Recommended follow up: Return in about 5 days (around 06/12/2022).  Subjective:  Patient presents today to establish care. Chief Complaint  Patient presents with   Transfer of care   Discuss medications    Wants Xanax or a similar medication (was discontinued).    For history taking, I took a per problem history from the patient and chart review as follows: Problem  Acute Respiratory Failure ([name]) (Resolved)  Mssa Bacteremia (Resolved)  Acute Bacterial Endocarditis (Resolved)     Depression Screen    06/07/2022   10:39 AM  PHQ 2/9 Scores  PHQ - 2 Score 0   No results found for any visits on 06/07/22.   The following were reviewed and entered/updated in epic: Past Medical History:  Diagnosis  Date   Anxiety    Endocarditis    Hepatitis    hx of hep C per pt   Opiate dependence ([name])    on methadone   Past Surgical History:  Procedure Laterality Date   APPLICATION OF ANGIOVAC N/A 11/28/2021   Procedure: APPLICATION OF ANGIOVAC;  Surgeon: Corliss Skains, MD;  Location: [facility] OR;  Service: Vascular;  Laterality: N/A;   MULTIPLE EXTRACTIONS WITH ALVEOLOPLASTY N/A 04/18/2022   Procedure: MULTIPLE EXTRACTION WITH ALVEOLOPLASTY;  Surgeon: Sharman Cheek, DMD;  Location: [facility] OR;  Service: Dentistry;  Laterality: N/A;   TEE WITHOUT CARDIOVERSION N/A 11/23/2021   Procedure: TRANSESOPHAGEAL ECHOCARDIOGRAM (TEE);  Surgeon: Christell Constant, MD;  Location: [facility] ENDOSCOPY;   Service: Cardiovascular;  Laterality: N/A;   TEE WITHOUT CARDIOVERSION N/A 11/28/2021   Procedure: TRANSESOPHAGEAL ECHOCARDIOGRAM (TEE);  Surgeon: Corliss Skains, MD;  Location: [facility] OR;  Service: Open Heart Surgery;  Laterality: N/A;   TEE WITHOUT CARDIOVERSION N/A 03/07/2022   Procedure: TRANSESOPHAGEAL ECHOCARDIOGRAM (TEE);  Surgeon: Christell Constant, MD;  Location[facility] ENDOSCOPY;  Service: Cardiovascular;  Laterality: N/A;   TEE WITHOUT CARDIOVERSION N/A 04/23/2022   Procedure: TRANSESOPHAGEAL ECHOCARDIOGRAM (TEE);  Surgeon: Corliss Skains, MD;  Location: [facility] OR;  Service: Open Heart Surgery;  Laterality: N/A;   TRICUSPID VALVE REPLACEMENT N/A 04/23/2022   Procedure: TRICUSPID VALVE REPAIR WITH MC3 TRICUSPID ANNULOPLASTY RING;  Surgeon: Corliss Skains, MD;  Location: [facility] OR;  Service: Open Heart Surgery;  Laterality: N/A;   tubes in ears     WRIST FRACTURE SURGERY     Past Surgical History:  Procedure Laterality Date   APPLICATION OF ANGIOVAC N/A 11/28/2021   Procedure: APPLICATION OF ANGIOVAC;  Surgeon: Corliss Skains, MD;  Location: [facility] OR;  Service: Vascular;  Laterality: N/A;   MULTIPLE EXTRACTIONS WITH ALVEOLOPLASTY N/A 04/18/2022   Procedure: MULTIPLE EXTRACTION WITH ALVEOLOPLASTY;  Surgeon: Sharman Cheek, DMD;  [facility] OR;  Service: Dentistry;  Laterality: N/A;   TEE WITHOUT CARDIOVERSION N/A 11/23/2021   Procedure: TRANSESOPHAGEAL ECHOCARDIOGRAM (TEE);  Surgeon: Christell Constant, MD;  Location: [facility] ENDOSCOPY;  Service: Cardiovascular;  Laterality: N/A;   TEE WITHOUT CARDIOVERSION N/A 11/28/2021   Procedure: TRANSESOPHAGEAL ECHOCARDIOGRAM (TEE);  Surgeon: Corliss Skains, MD;  Location: [facility] OR;  Service: Open Heart Surgery;  Laterality: N/A;   TEE WITHOUT CARDIOVERSION N/A 03/07/2022   Procedure: TRANSESOPHAGEAL ECHOCARDIOGRAM (TEE);  Surgeon: Christell Constant, MD;  Location: [facility] ENDOSCOPY;  Service: Cardiovascular;  Laterality: N/A;    TEE WITHOUT CARDIOVERSION N/A 04/23/2022   Procedure: TRANSESOPHAGEAL ECHOCARDIOGRAM (TEE);  Surgeon: Corliss Skains, MD;  Location: [facility] OR;  Service: Open Heart Surgery;  Laterality: N/A;   TRICUSPID VALVE REPLACEMENT N/A 04/23/2022   Procedure: TRICUSPID VALVE REPAIR WITH MC3 TRICUSPID ANNULOPLASTY RING;  Surgeon: Corliss Skains, MD;  Location[facility] OR;  Service: Open Heart Surgery;  Laterality: N/A;   tubes in ears     WRIST FRACTURE SURGERY     No family status information on file.   History reviewed. No pertinent family history. Outpatient Medications Prior to Visit  Medication Sig Dispense Refill   acetaminophen (TYLENOL) 650 MG CR tablet Take 650 mg by mouth every 8 (eight) hours as needed for pain.     ascorbic acid (VITAMIN C) 500 MG tablet Take 1 tablet (500 mg total) by mouth 2 (two) times daily with a meal. 30 tablet 0   Chlorhexidine Gluconate 2 % SOLN Apply 1 Application topically  daily. Apply chlorhexidine daily for 5 days every other week for 6 months to remove bacterial colonization. Do NOT wipe off.  Allow to air dry. 473 mL 11   cyanocobalamin 1000 MCG tablet Take 1 tablet (1,000 mcg total) by mouth 2 (two) times daily with a meal. 30 tablet 0   doxycycline (VIBRA-TABS) 100 MG tablet Take 1 tablet (100 mg total) by mouth 2 (two) times daily. Take with meals. 60 tablet 0   escitalopram (LEXAPRO) 20 MG tablet Take 1 tablet (20 mg total) by mouth daily. 30 tablet 0   Ferrous Fumarate (HEMOCYTE - 106 MG FE) 324 (106 Fe) MG TABS tablet Take 1 tablet (106 mg of iron total) by mouth daily. 30 tablet 0   folic acid (FOLVITE) 1 MG tablet Take 1 tablet (1 mg total) by mouth 2 (two) times daily with a meal. 30 tablet 0   gabapentin (NEURONTIN) 300 MG capsule Take 1 capsule (300 mg total) by mouth 3 (three) times daily. 90 capsule 0   methocarbamol (ROBAXIN) 500 MG tablet Take 1 tablet (500 mg total) by mouth every 8 (eight) hours as needed for muscle spasms. 30 tablet 0    mupirocin ointment (BACTROBAN) 2 % Apply into the nose. Repeat every other week for 6 months. With cotton tip swab, apply pea sized amount into each nostril 2 (two) times daily for 5 days.  Gently press sides of nose together to spread oint. AVOID contact with eyes. 22 g 11   nicotine (NICODERM CQ - DOSED IN MG/24 HOURS) 14 mg/24hr patch Place 1 patch (14 mg total) onto the skin daily. 28 patch 0   ondansetron (ZOFRAN) 4 MG tablet Take 4 mg by mouth every 8 (eight) hours as needed.     pantoprazole (PROTONIX) 40 MG tablet Take 1 tablet (40 mg total) by mouth daily. 30 tablet 0   warfarin (COUMADIN) 4 MG tablet Once per day on Sun, Mon, Thu, Fri, Sat 25 tablet 0   warfarin (COUMADIN) 6 MG tablet Once per day on Tue and Wed 10 tablet 0   No facility-administered medications prior to visit.    No Known Allergies Social History   Tobacco Use   Smoking status: Former    Packs/day: 1.00    Years: 19.00    Total pack years: 19.00    Types: Cigarettes    Quit date: 12/20/2021    Years since quitting: 0.4   Smokeless tobacco: Never   Tobacco comments:    Currently 5 cigs/day  Vaping Use   Vaping Use: Never used  Substance Use Topics   Alcohol use: No   Drug use: Yes    Types: Methamphetamines, Marijuana, Heroin    Comment: last use in April - meth; no heroin in 3 years; occasional marijuana    Immunization History  Administered Date(s) Administered   Td (Adult),5 Lf Tetanus Toxid, Preservative Free 06/19/2008   Tdap 01/07/2021      Objective:  BP 125/75 (BP Location: Right Arm, Patient Position: Sitting)   Pulse 82   Temp 98.3 F (36.8 C) (Temporal)   Resp 14   Ht 5\' 10"  (1.778 m)   Wt 184 lb 12.8 oz (83.8 kg)   SpO2 99%   BMI 26.52 kg/m  Body mass index is 26.52 kg/m.  He  is a very cordial and polite person who was a pleasure to meet.  Gen: NAD, anxious, uncomfortable, restless,  HEENT: Mucous membranes are moist. Sclera conjunctiva and lids grossly normal Neck:  no  thyromegaly, no cervical lymphadenopathy Ext: no edema Skin: warm, dry Neuro: grossly intact  No images are attached to the encounter or orders placed in the encounter.  Results for orders placed or performed during the hospital encounter of 02/25/22  Urine Culture   Specimen: Urine, Random  Result Value Ref Range   Specimen Description URINE, RANDOM    Special Requests NONE    Culture      NO GROWTH Performed at [facility], [address] [place], [place] [address]    Report Status 02/27/2022 FINAL   Culture, blood (Routine X 2) w Reflex to ID Panel   Specimen: BLOOD RIGHT HAND  Result Value Ref Range   Specimen Description BLOOD RIGHT HAND    Special Requests      BOTTLES DRAWN AEROBIC AND ANAEROBIC Blood Culture results may not be optimal due to an inadequate volume of blood received in culture bottles   Culture  Setup Time      GRAM POSITIVE COCCI IN CLUSTERS IN BOTH AEROBIC AND ANAEROBIC BOTTLES CRITICAL VALUE NOTED.  VALUE IS CONSISTENT WITH PREVIOUSLY REPORTED AND CALLED VALUE.    Culture (A)     STAPHYLOCOCCUS AUREUS SUSCEPTIBILITIES PERFORMED ON PREVIOUS CULTURE WITHIN THE LAST 5 DAYS. Performed at [facility], [address], [place], [place] [address]    Report Status 02/28/2022 FINAL   Culture, blood (Routine X 2) w Reflex to ID Panel   Specimen: BLOOD RIGHT HAND  Result Value Ref Range   Specimen Description BLOOD RIGHT HAND    Special Requests      BOTTLES DRAWN AEROBIC ONLY Blood Culture results may not be optimal due to an excessive volume of blood received in culture bottles   Culture      NO GROWTH 5 DAYS Performed at [facility], [address], [place], [place] [address]    Report Status 03/05/2022 FINAL   Culture, blood (Routine X 2) w Reflex to ID Panel   Specimen: BLOOD LEFT HAND  Result Value Ref Range   Specimen Description BLOOD LEFT HAND    Special Requests      BOTTLES DRAWN AEROBIC ONLY Blood Culture results may not  be optimal due to an excessive volume of blood received in culture bottles   Culture      NO GROWTH 5 DAYS Performed at [facility], [address], [place], [place] [address]    Report Status 03/05/2022 FINAL   Culture, blood (Routine X 2) w Reflex to ID Panel   Specimen: BLOOD RIGHT HAND  Result Value Ref Range   Specimen Description BLOOD RIGHT HAND    Special Requests      BOTTLES DRAWN AEROBIC AND ANAEROBIC Blood Culture results may not be optimal due to an excessive volume of blood received in culture bottles   Culture      NO GROWTH 5 DAYS Performed at [facility], [address] [place], [place] [address]    Report Status 03/07/2022 FINAL   Culture, blood (Routine X 2) w Reflex to ID Panel   Specimen: BLOOD LEFT HAND  Result Value Ref Range   Specimen Description BLOOD LEFT HAND    Special Requests      BOTTLES DRAWN AEROBIC AND ANAEROBIC Blood Culture results may not be optimal due to an excessive volume of blood received in culture bottles   Culture      NO GROWTH 5 DAYS Performed at [facility], [address], [place], [place] [address]    Report Status 03/07/2022 FINAL   Culture, blood (Routine X 2) w Reflex to ID Panel   Specimen: BLOOD  Result Value Ref Range   Specimen Description BLOOD BLOOD LEFT FOREARM    Special Requests      BOTTLES DRAWN AEROBIC AND ANAEROBIC Blood Culture results may not be optimal due to an inadequate volume of blood received in culture bottles ARPEDB USED AS AEROBIC BOTTLE   Culture  Setup Time      GRAM POSITIVE COCCI IN CLUSTERS IN BOTH AEROBIC AND ANAEROBIC BOTTLES CRITICAL RESULT CALLED TO, READ BACK BY AND VERIFIED WITH: PHARMD Lavonne Chick @1728  FH    Culture (A)     STAPHYLOCOCCUS AUREUS SUSCEPTIBILITIES PERFORMED ON PREVIOUS CULTURE WITHIN THE LAST 5 DAYS. Performed at [facility], [address], [place], [place] [address]    Report Status 04/12/2022 FINAL   Culture, blood (Routine X 2)  w Reflex to ID Panel   Specimen: BLOOD  Result Value Ref Range   Specimen Description BLOOD BLOOD LEFT FOREARM    Special Requests      BOTTLES DRAWN AEROBIC AND ANAEROBIC Blood Culture results may not be optimal due to an inadequate volume of blood received in culture bottles ARPEDB USED AS AEROBIC   Culture  Setup Time      GRAM POSITIVE COCCI IN CLUSTERS IN BOTH AEROBIC AND ANAEROBIC BOTTLES CRITICAL RESULT CALLED TO, READ BACK BY AND VERIFIED WITH: PHARMD MADELEINE MITCHELL ON 04/09/22 @ 1625 BY DRT    Culture (A)     METHICILLIN RESISTANT STAPHYLOCOCCUS AUREUS RETEST CONFIRMED MODIFICATION OF PBP (MECA) SEE SEPARATE REPORT Performed at [facility], [address] [place], [place] [address]    Report Status 04/22/2022 FINAL    Organism ID, Bacteria METHICILLIN RESISTANT STAPHYLOCOCCUS AUREUS       Susceptibility   Methicillin resistant staphylococcus aureus - MIC*    CIPROFLOXACIN <=0.5 SENSITIVE Sensitive     ERYTHROMYCIN >=8 RESISTANT Resistant     GENTAMICIN <=0.5 SENSITIVE Sensitive     OXACILLIN RESISTANT Resistant     TETRACYCLINE <=1 SENSITIVE Sensitive     VANCOMYCIN 2 SENSITIVE Sensitive     TRIMETH/SULFA <=10 SENSITIVE Sensitive     CLINDAMYCIN <=0.25 SENSITIVE Sensitive     RIFAMPIN <=0.5 SENSITIVE Sensitive     Inducible Clindamycin NEGATIVE Sensitive     * METHICILLIN RESISTANT STAPHYLOCOCCUS AUREUS  SARS Coronavirus 2 by RT PCR (hospital order, performed in [facility] hospital lab) *cepheid single result test* Anterior Nasal Swab   Specimen: Anterior Nasal Swab  Result Value Ref Range   SARS Coronavirus 2 by RT PCR NEGATIVE NEGATIVE  Blood Culture ID Panel (Reflexed)  Result Value Ref Range   Enterococcus faecalis NOT DETECTED NOT DETECTED   Enterococcus Faecium NOT DETECTED NOT DETECTED   Listeria monocytogenes NOT DETECTED NOT DETECTED   Staphylococcus species DETECTED (A) NOT DETECTED   Staphylococcus aureus (BCID) DETECTED (A) NOT DETECTED    Staphylococcus epidermidis NOT DETECTED NOT DETECTED   Staphylococcus lugdunensis NOT DETECTED NOT DETECTED   Streptococcus species NOT DETECTED NOT DETECTED   Streptococcus agalactiae NOT DETECTED NOT DETECTED   Streptococcus pneumoniae NOT DETECTED NOT DETECTED   Streptococcus pyogenes NOT DETECTED NOT DETECTED   A.calcoaceticus-baumannii NOT DETECTED NOT DETECTED   Bacteroides fragilis NOT DETECTED NOT DETECTED   Enterobacterales NOT DETECTED NOT DETECTED   Enterobacter cloacae complex NOT DETECTED NOT DETECTED  Escherichia coli NOT DETECTED NOT DETECTED   Klebsiella aerogenes NOT DETECTED NOT DETECTED   Klebsiella oxytoca NOT DETECTED NOT DETECTED   Klebsiella pneumoniae NOT DETECTED NOT DETECTED   Proteus species NOT DETECTED NOT DETECTED   Salmonella species NOT DETECTED NOT DETECTED   Serratia marcescens NOT DETECTED NOT DETECTED   Haemophilus influenzae NOT DETECTED NOT DETECTED   Neisseria meningitidis NOT DETECTED NOT DETECTED   Pseudomonas aeruginosa NOT DETECTED NOT DETECTED   Stenotrophomonas maltophilia NOT DETECTED NOT DETECTED   Candida albicans NOT DETECTED NOT DETECTED   Candida auris NOT DETECTED NOT DETECTED   Candida glabrata NOT DETECTED NOT DETECTED   Candida krusei NOT DETECTED NOT DETECTED   Candida parapsilosis NOT DETECTED NOT DETECTED   Candida tropicalis NOT DETECTED NOT DETECTED   Cryptococcus neoformans/gattii NOT DETECTED NOT DETECTED   Meth resistant mecA/C and MREJ NOT DETECTED NOT DETECTED  Culture, blood (Routine X 2) w Reflex to ID Panel   Specimen: BLOOD RIGHT ARM  Result Value Ref Range   Specimen Description BLOOD RIGHT ARM    Special Requests      BOTTLES DRAWN AEROBIC AND ANAEROBIC Blood Culture results may not be optimal due to an inadequate volume of blood received in culture bottles   Culture      NO GROWTH 5 DAYS Performed at [facility], [address], [place], [place] [address]    Report Status 04/16/2022 FINAL    Culture, blood (Routine X 2) w Reflex to ID Panel   Specimen: BLOOD LEFT ARM  Result Value Ref Range   Specimen Description BLOOD LEFT ARM    Special Requests      BOTTLES DRAWN AEROBIC AND ANAEROBIC Blood Culture results may not be optimal due to an inadequate volume of blood received in culture bottles   Culture      NO GROWTH 5 DAYS Performed at [facility], [address] [place], [place] [address]    Report Status 04/16/2022 FINAL   Min Inhibitory Conc (2 Drugs)  Result Value Ref Range   Min Inhibitory Conc (2 Drugs) Final report (A)    Source (MIC2) 88,005   MIC Results (2 Drugs)  Result Value Ref Range   RESULT 5 MIC (RESULT 1)                Base Name: Comment (A)   Fungus Culture With Stain   Specimen: Heart Valve Leaflet(s); Tissue  Result Value Ref Range   Fungus Stain Final report    Fungus (Mycology) Culture Final report    Fungal Source TISSUE   Aerobic/Anaerobic Culture w Gram Stain (surgical/deep wound)   Specimen: Heart Valve Leaflet(s); Tissue  Result Value Ref Range   Specimen Description TISSUE    Special Requests  VALVE LEAFLET    Gram Stain      RARE WBC PRESENT,BOTH PMN AND MONONUCLEAR NO ORGANISMS SEEN    Culture      ABUNDANT STAPHYLOCOCCUS AUREUS CRITICAL RESULT CALLED TO, READ BACK BY AND VERIFIED WITH: RN [email or web address].B AT 1301 ON 04/24/2022 BY T.SAAD. NO ANAEROBES ISOLATED SEE SEPARATE REPORT FOR ADDITIONAL SUSCEPTIBILITY RESULTS Performed at [facility], [address] [place], [place] [address]    Report Status 05/05/2022 FINAL   Acid Fast Culture with reflexed sensitivities   Specimen: Heart Valve Leaflet(s); Tissue  Result Value Ref Range   Acid Fast Culture Negative    Source of Sample TISSUE   Acid Fast Smear (AFB)  Specimen: Heart Valve Leaflet(s); Tissue  Result Value Ref Range   AFB Specimen Processing Comment    Acid Fast Smear Negative    Source (AFB) TISSUE   Susceptibility, Aer + Anaerob  Result Value Ref Range    Suscept, Aer + Anaerob Final report (A)    Source of Sample TISSUE HEART VALVE STAPH AUREUS   Susceptibility, Aer + Anaerob  Result Value Ref Range   Suscept, Aer + Anaerob Final report (A)    Source of Sample TISSUE HEART VALVE STAPH AUREUS ISOLATE 2   Min Inhibitory Conc (2 Drugs)  Result Value Ref Range   Min Inhibitory Conc (2 Drugs) Final report    Source (MIC2)      TISSUE HEART VALVE STAPH AUREUS NO 1 DAPTOMYCIN AND CEFTAROLINE  Min Inhibitory Conc (2 Drugs)  Result Value Ref Range   Min Inhibitory Conc (2 Drugs) Final report    Source (MIC2)      TISSUE HEART VALVE STAPH AUREUS NO 2 DAPTOMYCIN AND CEFTAROLINE  Susceptibility Result  Result Value Ref Range   Suscept Result 1 Staphylococcus aureus (A)    Antimicrobial Suscept Comment   Susceptibility Result  Result Value Ref Range   Suscept Result 1 Staphylococcus aureus (A)    Antimicrobial Suscept Comment   MIC Results (2 Drugs)  Result Value Ref Range   RESULT 5 MIC (RESULT 1)                Base Name: Staphylococcus aureus   MIC Results (2 Drugs)  Result Value Ref Range   RESULT 5 MIC (RESULT 1)                Base Name: Staphylococcus aureus   Fungus Culture Result  Result Value Ref Range   Result 1 Comment   Fungal organism reflex  Result Value Ref Range   Fungal result 1 Comment   CBC with Differential  Result Value Ref Range   WBC 14.0 (H) 4.0 - 10.5 K/uL   RBC 4.06 (L) 4.22 - 5.81 MIL/uL   Hemoglobin 11.0 (L) 13.0 - 17.0 g/dL   HCT 16.1 (L) 09.6 - 04.5 %   MCV 82.3 80.0 - 100.0 fL   MCH 27.1 26.0 - 34.0 pg   MCHC 32.9 30.0 - 36.0 g/dL   RDW 40.9 81.1 - 91.4 %   Platelets 117 (L) 150 - 400 K/uL   nRBC 0.0 0.0 - 0.2 %   Neutrophils Relative % 82 %   Neutro Abs 11.6 (H) 1.7 - 7.7 K/uL   Lymphocytes Relative 7 %   Lymphs Abs 1.0 0.7 - 4.0 K/uL   Monocytes Relative 10 %   Monocytes Absolute 1.3 (H) 0.1 - 1.0 K/uL   Eosinophils Relative 0 %   Eosinophils Absolute 0.0 0.0 - 0.5 K/uL   Basophils  Relative 0 %   Basophils Absolute 0.0 0.0 - 0.1 K/uL   Immature Granulocytes 1 %   Abs Immature Granulocytes 0.08 (H) 0.00 - 0.07 K/uL  Comprehensive metabolic panel  Result Value Ref Range   Sodium 127 (L) 135 - 145 mmol/L   Potassium 2.6 (LL) 3.5 - 5.1 mmol/L   Chloride 87 (L) 98 - 111 mmol/L   CO2 28 22 - 32 mmol/L   Glucose, Bld 104 (H) 70 - 99 mg/dL   BUN 15 6 - 20 mg/dL   Creatinine, Ser 7.82 0.61 - 1.24 mg/dL   Calcium 8.4 (L) 8.9 - 10.3 mg/dL   Total Protein  7.3 6.5 - 8.1 g/dL   Albumin 2.8 (L) 3.5 - 5.0 g/dL   AST 24 15 - 41 U/L   ALT 12 0 - 44 U/L   Alkaline Phosphatase 56 38 - 126 U/L   Total Bilirubin 1.1 0.3 - 1.2 mg/dL   GFR, Estimated >79 >02 mL/min   Anion gap 12 5 - 15  Lipase, blood  Result Value Ref Range   Lipase 21 11 - 51 U/L  Urinalysis, Routine w reflex microscopic  Result Value Ref Range   Color, Urine AMBER (A) YELLOW   APPearance HAZY (A) CLEAR   Specific Gravity, Urine 1.015 1.005 - 1.030   pH 6.0 5.0 - 8.0   Glucose, UA NEGATIVE NEGATIVE mg/dL   Hgb urine dipstick LARGE (A) NEGATIVE   Bilirubin Urine NEGATIVE NEGATIVE   Ketones, ur NEGATIVE NEGATIVE mg/dL   Protein, ur 30 (A) NEGATIVE mg/dL   Nitrite NEGATIVE NEGATIVE   Leukocytes,Ua TRACE (A) NEGATIVE   RBC / HPF 21-50 0 - 5 RBC/hpf   WBC, UA 21-50 0 - 5 WBC/hpf   Bacteria, UA FEW (A) NONE SEEN   Mucus PRESENT    Hyaline Casts, UA PRESENT   Lactic acid, plasma  Result Value Ref Range   Lactic Acid, Venous 2.1 (HH) 0.5 - 1.9 mmol/L  Lactic acid, plasma  Result Value Ref Range   Lactic Acid, Venous 1.8 0.5 - 1.9 mmol/L  Protime-INR  Result Value Ref Range   Prothrombin Time 15.9 (H) 11.4 - 15.2 seconds   INR 1.3 (H) 0.8 - 1.2  Magnesium  Result Value Ref Range   Magnesium 1.3 (L) 1.7 - 2.4 mg/dL  Basic metabolic panel  Result Value Ref Range   Sodium 131 (L) 135 - 145 mmol/L   Potassium 2.9 (L) 3.5 - 5.1 mmol/L   Chloride 95 (L) 98 - 111 mmol/L   CO2 29 22 - 32 mmol/L    Glucose, Bld 86 70 - 99 mg/dL   BUN 10 6 - 20 mg/dL   Creatinine, Ser 4.09 0.61 - 1.24 mg/dL   Calcium 7.7 (L) 8.9 - 10.3 mg/dL   GFR, Estimated >73 >53 mL/min   Anion gap 7 5 - 15  CBC  Result Value Ref Range   WBC 9.9 4.0 - 10.5 K/uL   RBC 3.38 (L) 4.22 - 5.81 MIL/uL   Hemoglobin 9.3 (L) 13.0 - 17.0 g/dL   HCT 29.9 (L) 24.2 - 68.3 %   MCV 82.2 80.0 - 100.0 fL   MCH 27.5 26.0 - 34.0 pg   MCHC 33.5 30.0 - 36.0 g/dL   RDW 41.9 62.2 - 29.7 %   Platelets 112 (L) 150 - 400 K/uL   nRBC 0.0 0.0 - 0.2 %  Basic metabolic panel  Result Value Ref Range   Sodium 133 (L) 135 - 145 mmol/L   Potassium 3.0 (L) 3.5 - 5.1 mmol/L   Chloride 96 (L) 98 - 111 mmol/L   CO2 29 22 - 32 mmol/L   Glucose, Bld 104 (H) 70 - 99 mg/dL   BUN 7 6 - 20 mg/dL   Creatinine, Ser 9.89 0.61 - 1.24 mg/dL   Calcium 7.9 (L) 8.9 - 10.3 mg/dL   GFR, Estimated >21 >19 mL/min   Anion gap 8 5 - 15  Magnesium  Result Value Ref Range   Magnesium 1.5 (L) 1.7 - 2.4 mg/dL  CBC  Result Value Ref Range   WBC 7.2 4.0 - 10.5 K/uL   RBC  3.23 (L) 4.22 - 5.81 MIL/uL   Hemoglobin 8.8 (L) 13.0 - 17.0 g/dL   HCT 16.1 (L) 09.6 - 04.5 %   MCV 80.5 80.0 - 100.0 fL   MCH 27.2 26.0 - 34.0 pg   MCHC 33.8 30.0 - 36.0 g/dL   RDW 40.9 81.1 - 91.4 %   Platelets 115 (L) 150 - 400 K/uL   nRBC 0.0 0.0 - 0.2 %  Glucose, capillary  Result Value Ref Range   Glucose-Capillary 129 (H) 70 - 99 mg/dL  Comprehensive metabolic panel  Result Value Ref Range   Sodium 136 135 - 145 mmol/L   Potassium 3.4 (L) 3.5 - 5.1 mmol/L   Chloride 101 98 - 111 mmol/L   CO2 28 22 - 32 mmol/L   Glucose, Bld 124 (H) 70 - 99 mg/dL   BUN 11 6 - 20 mg/dL   Creatinine, Ser 7.82 (H) 0.61 - 1.24 mg/dL   Calcium 8.3 (L) 8.9 - 10.3 mg/dL   Total Protein 6.9 6.5 - 8.1 g/dL   Albumin 2.4 (L) 3.5 - 5.0 g/dL   AST 21 15 - 41 U/L   ALT 8 0 - 44 U/L   Alkaline Phosphatase 52 38 - 126 U/L   Total Bilirubin 0.5 0.3 - 1.2 mg/dL   GFR, Estimated >95 >62 mL/min   Anion  gap 7 5 - 15  CBC  Result Value Ref Range   WBC 8.2 4.0 - 10.5 K/uL   RBC 3.70 (L) 4.22 - 5.81 MIL/uL   Hemoglobin 10.1 (L) 13.0 - 17.0 g/dL   HCT 13.0 (L) 86.5 - 78.4 %   MCV 83.0 80.0 - 100.0 fL   MCH 27.3 26.0 - 34.0 pg   MCHC 32.9 30.0 - 36.0 g/dL   RDW 69.6 29.5 - 28.4 %   Platelets 169 150 - 400 K/uL   nRBC 0.0 0.0 - 0.2 %  Lactic acid, plasma  Result Value Ref Range   Lactic Acid, Venous 1.9 0.5 - 1.9 mmol/L  Lactic acid, plasma  Result Value Ref Range   Lactic Acid, Venous 1.5 0.5 - 1.9 mmol/L  Magnesium  Result Value Ref Range   Magnesium 2.5 (H) 1.7 - 2.4 mg/dL  CK  Result Value Ref Range   Total CK 25 (L) 49 - 397 U/L  CBC  Result Value Ref Range   WBC 7.7 4.0 - 10.5 K/uL   RBC 3.45 (L) 4.22 - 5.81 MIL/uL   Hemoglobin 9.3 (L) 13.0 - 17.0 g/dL   HCT 13.2 (L) 44.0 - 10.2 %   MCV 81.4 80.0 - 100.0 fL   MCH 27.0 26.0 - 34.0 pg   MCHC 33.1 30.0 - 36.0 g/dL   RDW 72.5 (H) 36.6 - 44.0 %   Platelets 211 150 - 400 K/uL   nRBC 0.0 0.0 - 0.2 %  Comprehensive metabolic panel  Result Value Ref Range   Sodium 133 (L) 135 - 145 mmol/L   Potassium 3.6 3.5 - 5.1 mmol/L   Chloride 99 98 - 111 mmol/L   CO2 28 22 - 32 mmol/L   Glucose, Bld 89 70 - 99 mg/dL   BUN 7 6 - 20 mg/dL   Creatinine, Ser 3.47 0.61 - 1.24 mg/dL   Calcium 7.9 (L) 8.9 - 10.3 mg/dL   Total Protein 6.2 (L) 6.5 - 8.1 g/dL   Albumin 2.0 (L) 3.5 - 5.0 g/dL   AST 21 15 - 41 U/L   ALT 7 0 - 44  U/L   Alkaline Phosphatase 47 38 - 126 U/L   Total Bilirubin 0.4 0.3 - 1.2 mg/dL   GFR, Estimated >16 >10 mL/min   Anion gap 6 5 - 15  CK  Result Value Ref Range   Total CK 14 (L) 49 - 397 U/L  Basic metabolic panel  Result Value Ref Range   Sodium 135 135 - 145 mmol/L   Potassium 3.9 3.5 - 5.1 mmol/L   Chloride 100 98 - 111 mmol/L   CO2 27 22 - 32 mmol/L   Glucose, Bld 89 70 - 99 mg/dL   BUN 11 6 - 20 mg/dL   Creatinine, Ser 9.60 0.61 - 1.24 mg/dL   Calcium 8.2 (L) 8.9 - 10.3 mg/dL   GFR, Estimated >45  >40 mL/min   Anion gap 8 5 - 15  CBC  Result Value Ref Range   WBC 6.3 4.0 - 10.5 K/uL   RBC 2.83 (L) 4.22 - 5.81 MIL/uL   Hemoglobin 7.4 (L) 13.0 - 17.0 g/dL   HCT 98.1 (L) 19.1 - 47.8 %   MCV 85.5 80.0 - 100.0 fL   MCH 26.1 26.0 - 34.0 pg   MCHC 30.6 30.0 - 36.0 g/dL   RDW 29.5 (H) 62.1 - 30.8 %   Platelets 169 150 - 400 K/uL   nRBC 0.0 0.0 - 0.2 %  CK  Result Value Ref Range   Total CK 10 (L) 49 - 397 U/L  Basic metabolic panel  Result Value Ref Range   Sodium 138 135 - 145 mmol/L   Potassium 3.8 3.5 - 5.1 mmol/L   Chloride 101 98 - 111 mmol/L   CO2 30 22 - 32 mmol/L   Glucose, Bld 114 (H) 70 - 99 mg/dL   BUN 10 6 - 20 mg/dL   Creatinine, Ser 6.57 0.61 - 1.24 mg/dL   Calcium 8.6 (L) 8.9 - 10.3 mg/dL   GFR, Estimated >84 >69 mL/min   Anion gap 7 5 - 15  CBC  Result Value Ref Range   WBC 7.8 4.0 - 10.5 K/uL   RBC 2.88 (L) 4.22 - 5.81 MIL/uL   Hemoglobin 7.7 (L) 13.0 - 17.0 g/dL   HCT 62.9 (L) 52.8 - 41.3 %   MCV 85.1 80.0 - 100.0 fL   MCH 26.7 26.0 - 34.0 pg   MCHC 31.4 30.0 - 36.0 g/dL   RDW 24.4 (H) 01.0 - 27.2 %   Platelets 283 150 - 400 K/uL   nRBC 0.0 0.0 - 0.2 %  CBC with Differential/Platelet  Result Value Ref Range   WBC 7.5 4.0 - 10.5 K/uL   RBC 3.01 (L) 4.22 - 5.81 MIL/uL   Hemoglobin 8.0 (L) 13.0 - 17.0 g/dL   HCT 53.6 (L) 64.4 - 03.4 %   MCV 85.0 80.0 - 100.0 fL   MCH 26.6 26.0 - 34.0 pg   MCHC 31.3 30.0 - 36.0 g/dL   RDW 74.2 (H) 59.5 - 63.8 %   Platelets 320 150 - 400 K/uL   nRBC 0.0 0.0 - 0.2 %   Neutrophils Relative % 57 %   Neutro Abs 4.4 1.7 - 7.7 K/uL   Lymphocytes Relative 31 %   Lymphs Abs 2.3 0.7 - 4.0 K/uL   Monocytes Relative 8 %   Monocytes Absolute 0.6 0.1 - 1.0 K/uL   Eosinophils Relative 2 %   Eosinophils Absolute 0.2 0.0 - 0.5 K/uL   Basophils Relative 1 %   Basophils Absolute 0.0 0.0 -  0.1 K/uL   Immature Granulocytes 1 %   Abs Immature Granulocytes 0.04 0.00 - 0.07 K/uL  Comprehensive metabolic panel  Result Value Ref  Range   Sodium 137 135 - 145 mmol/L   Potassium 4.4 3.5 - 5.1 mmol/L   Chloride 102 98 - 111 mmol/L   CO2 28 22 - 32 mmol/L   Glucose, Bld 93 70 - 99 mg/dL   BUN 19 6 - 20 mg/dL   Creatinine, Ser 1.19 0.61 - 1.24 mg/dL   Calcium 8.9 8.9 - 14.7 mg/dL   Total Protein 6.8 6.5 - 8.1 g/dL   Albumin 2.3 (L) 3.5 - 5.0 g/dL   AST 12 (L) 15 - 41 U/L   ALT 9 0 - 44 U/L   Alkaline Phosphatase 55 38 - 126 U/L   Total Bilirubin 0.4 0.3 - 1.2 mg/dL   GFR, Estimated >82 >95 mL/min   Anion gap 7 5 - 15  Magnesium  Result Value Ref Range   Magnesium 1.9 1.7 - 2.4 mg/dL  CBC with Differential/Platelet  Result Value Ref Range   WBC 7.9 4.0 - 10.5 K/uL   RBC 3.21 (L) 4.22 - 5.81 MIL/uL   Hemoglobin 8.5 (L) 13.0 - 17.0 g/dL   HCT 62.1 (L) 30.8 - 65.7 %   MCV 84.1 80.0 - 100.0 fL   MCH 26.5 26.0 - 34.0 pg   MCHC 31.5 30.0 - 36.0 g/dL   RDW 84.6 (H) 96.2 - 95.2 %   Platelets 348 150 - 400 K/uL   nRBC 0.0 0.0 - 0.2 %   Neutrophils Relative % 58 %   Neutro Abs 4.7 1.7 - 7.7 K/uL   Lymphocytes Relative 30 %   Lymphs Abs 2.4 0.7 - 4.0 K/uL   Monocytes Relative 8 %   Monocytes Absolute 0.6 0.1 - 1.0 K/uL   Eosinophils Relative 3 %   Eosinophils Absolute 0.2 0.0 - 0.5 K/uL   Basophils Relative 0 %   Basophils Absolute 0.0 0.0 - 0.1 K/uL   Immature Granulocytes 1 %   Abs Immature Granulocytes 0.04 0.00 - 0.07 K/uL  Comprehensive metabolic panel  Result Value Ref Range   Sodium 137 135 - 145 mmol/L   Potassium 4.5 3.5 - 5.1 mmol/L   Chloride 102 98 - 111 mmol/L   CO2 28 22 - 32 mmol/L   Glucose, Bld 89 70 - 99 mg/dL   BUN 21 (H) 6 - 20 mg/dL   Creatinine, Ser 8.41 0.61 - 1.24 mg/dL   Calcium 9.4 8.9 - 32.4 mg/dL   Total Protein 7.3 6.5 - 8.1 g/dL   Albumin 2.5 (L) 3.5 - 5.0 g/dL   AST 13 (L) 15 - 41 U/L   ALT 9 0 - 44 U/L   Alkaline Phosphatase 57 38 - 126 U/L   Total Bilirubin 0.2 (L) 0.3 - 1.2 mg/dL   GFR, Estimated >40 >10 mL/min   Anion gap 7 5 - 15  Magnesium  Result Value Ref  Range   Magnesium 2.0 1.7 - 2.4 mg/dL  Comprehensive metabolic panel  Result Value Ref Range   Sodium 136 135 - 145 mmol/L   Potassium 4.8 3.5 - 5.1 mmol/L   Chloride 100 98 - 111 mmol/L   CO2 28 22 - 32 mmol/L   Glucose, Bld 86 70 - 99 mg/dL   BUN 36 (H) 6 - 20 mg/dL   Creatinine, Ser 2.72 0.61 - 1.24 mg/dL   Calcium 9.4 8.9 - 53.6 mg/dL  Total Protein 7.8 6.5 - 8.1 g/dL   Albumin 2.9 (L) 3.5 - 5.0 g/dL   AST 14 (L) 15 - 41 U/L   ALT 9 0 - 44 U/L   Alkaline Phosphatase 73 38 - 126 U/L   Total Bilirubin 0.5 0.3 - 1.2 mg/dL   GFR, Estimated >70 >35 mL/min   Anion gap 8 5 - 15  Magnesium  Result Value Ref Range   Magnesium 2.4 1.7 - 2.4 mg/dL  CK  Result Value Ref Range   Total CK 13 (L) 49 - 397 U/L  CBC with Differential/Platelet  Result Value Ref Range   WBC 9.4 4.0 - 10.5 K/uL   RBC 3.57 (L) 4.22 - 5.81 MIL/uL   Hemoglobin 9.4 (L) 13.0 - 17.0 g/dL   HCT 00.9 (L) 38.1 - 82.9 %   MCV 85.2 80.0 - 100.0 fL   MCH 26.3 26.0 - 34.0 pg   MCHC 30.9 30.0 - 36.0 g/dL   RDW 93.7 (H) 16.9 - 67.8 %   Platelets 384 150 - 400 K/uL   nRBC 0.0 0.0 - 0.2 %   Neutrophils Relative % 58 %   Neutro Abs 5.5 1.7 - 7.7 K/uL   Lymphocytes Relative 29 %   Lymphs Abs 2.7 0.7 - 4.0 K/uL   Monocytes Relative 9 %   Monocytes Absolute 0.8 0.1 - 1.0 K/uL   Eosinophils Relative 3 %   Eosinophils Absolute 0.3 0.0 - 0.5 K/uL   Basophils Relative 0 %   Basophils Absolute 0.0 0.0 - 0.1 K/uL   Immature Granulocytes 1 %   Abs Immature Granulocytes 0.05 0.00 - 0.07 K/uL  Comprehensive metabolic panel  Result Value Ref Range   Sodium 138 135 - 145 mmol/L   Potassium 4.4 3.5 - 5.1 mmol/L   Chloride 103 98 - 111 mmol/L   CO2 29 22 - 32 mmol/L   Glucose, Bld 95 70 - 99 mg/dL   BUN 33 (H) 6 - 20 mg/dL   Creatinine, Ser 9.38 0.61 - 1.24 mg/dL   Calcium 9.4 8.9 - 10.1 mg/dL   Total Protein 7.6 6.5 - 8.1 g/dL   Albumin 3.0 (L) 3.5 - 5.0 g/dL   AST 15 15 - 41 U/L   ALT 14 0 - 44 U/L   Alkaline  Phosphatase 73 38 - 126 U/L   Total Bilirubin 0.3 0.3 - 1.2 mg/dL   GFR, Estimated >75 >10 mL/min   Anion gap 6 5 - 15  Magnesium  Result Value Ref Range   Magnesium 1.9 1.7 - 2.4 mg/dL  CK  Result Value Ref Range   Total CK 15 (L) 49 - 397 U/L  CBC with Differential/Platelet  Result Value Ref Range   WBC 7.6 4.0 - 10.5 K/uL   RBC 3.35 (L) 4.22 - 5.81 MIL/uL   Hemoglobin 8.9 (L) 13.0 - 17.0 g/dL   HCT 25.8 (L) 52.7 - 78.2 %   MCV 86.3 80.0 - 100.0 fL   MCH 26.6 26.0 - 34.0 pg   MCHC 30.8 30.0 - 36.0 g/dL   RDW 42.3 (H) 53.6 - 14.4 %   Platelets 272 150 - 400 K/uL   nRBC 0.0 0.0 - 0.2 %   Neutrophils Relative % 56 %   Neutro Abs 4.3 1.7 - 7.7 K/uL   Lymphocytes Relative 31 %   Lymphs Abs 2.4 0.7 - 4.0 K/uL   Monocytes Relative 9 %   Monocytes Absolute 0.7 0.1 - 1.0 K/uL  Eosinophils Relative 3 %   Eosinophils Absolute 0.2 0.0 - 0.5 K/uL   Basophils Relative 1 %   Basophils Absolute 0.0 0.0 - 0.1 K/uL   Immature Granulocytes 0 %   Abs Immature Granulocytes 0.02 0.00 - 0.07 K/uL  Comprehensive metabolic panel  Result Value Ref Range   Sodium 136 135 - 145 mmol/L   Potassium 4.5 3.5 - 5.1 mmol/L   Chloride 101 98 - 111 mmol/L   CO2 30 22 - 32 mmol/L   Glucose, Bld 103 (H) 70 - 99 mg/dL   BUN 29 (H) 6 - 20 mg/dL   Creatinine, Ser 6.96 0.61 - 1.24 mg/dL   Calcium 9.3 8.9 - 29.5 mg/dL   Total Protein 7.6 6.5 - 8.1 g/dL   Albumin 3.1 (L) 3.5 - 5.0 g/dL   AST 27 15 - 41 U/L   ALT 26 0 - 44 U/L   Alkaline Phosphatase 80 38 - 126 U/L   Total Bilirubin 0.4 0.3 - 1.2 mg/dL   GFR, Estimated >28 >41 mL/min   Anion gap 5 5 - 15  Magnesium  Result Value Ref Range   Magnesium 2.0 1.7 - 2.4 mg/dL  CK  Result Value Ref Range   Total CK 22 (L) 49 - 397 U/L  CBC with Differential/Platelet  Result Value Ref Range   WBC 6.8 4.0 - 10.5 K/uL   RBC 3.44 (L) 4.22 - 5.81 MIL/uL   Hemoglobin 9.2 (L) 13.0 - 17.0 g/dL   HCT 32.4 (L) 40.1 - 02.7 %   MCV 86.6 80.0 - 100.0 fL   MCH  26.7 26.0 - 34.0 pg   MCHC 30.9 30.0 - 36.0 g/dL   RDW 25.3 (H) 66.4 - 40.3 %   Platelets 263 150 - 400 K/uL   nRBC 0.0 0.0 - 0.2 %   Neutrophils Relative % 56 %   Neutro Abs 3.8 1.7 - 7.7 K/uL   Lymphocytes Relative 32 %   Lymphs Abs 2.2 0.7 - 4.0 K/uL   Monocytes Relative 8 %   Monocytes Absolute 0.6 0.1 - 1.0 K/uL   Eosinophils Relative 3 %   Eosinophils Absolute 0.2 0.0 - 0.5 K/uL   Basophils Relative 1 %   Basophils Absolute 0.1 0.0 - 0.1 K/uL   Immature Granulocytes 0 %   Abs Immature Granulocytes 0.01 0.00 - 0.07 K/uL  Comprehensive metabolic panel  Result Value Ref Range   Sodium 136 135 - 145 mmol/L   Potassium 4.1 3.5 - 5.1 mmol/L   Chloride 102 98 - 111 mmol/L   CO2 29 22 - 32 mmol/L   Glucose, Bld 113 (H) 70 - 99 mg/dL   BUN 24 (H) 6 - 20 mg/dL   Creatinine, Ser 4.74 0.61 - 1.24 mg/dL   Calcium 9.0 8.9 - 25.9 mg/dL   Total Protein 7.2 6.5 - 8.1 g/dL   Albumin 3.1 (L) 3.5 - 5.0 g/dL   AST 75 (H) 15 - 41 U/L   ALT 101 (H) 0 - 44 U/L   Alkaline Phosphatase 79 38 - 126 U/L   Total Bilirubin 0.4 0.3 - 1.2 mg/dL   GFR, Estimated >56 >38 mL/min   Anion gap 5 5 - 15  Magnesium  Result Value Ref Range   Magnesium 1.9 1.7 - 2.4 mg/dL  CK  Result Value Ref Range   Total CK 21 (L) 49 - 397 U/L  CBC with Differential/Platelet  Result Value Ref Range   WBC 6.1 4.0 - 10.5  K/uL   RBC 3.39 (L) 4.22 - 5.81 MIL/uL   Hemoglobin 9.1 (L) 13.0 - 17.0 g/dL   HCT 16.1 (L) 09.6 - 04.5 %   MCV 86.4 80.0 - 100.0 fL   MCH 26.8 26.0 - 34.0 pg   MCHC 31.1 30.0 - 36.0 g/dL   RDW 40.9 (H) 81.1 - 91.4 %   Platelets 217 150 - 400 K/uL   nRBC 0.0 0.0 - 0.2 %   Neutrophils Relative % 54 %   Neutro Abs 3.3 1.7 - 7.7 K/uL   Lymphocytes Relative 35 %   Lymphs Abs 2.1 0.7 - 4.0 K/uL   Monocytes Relative 8 %   Monocytes Absolute 0.5 0.1 - 1.0 K/uL   Eosinophils Relative 3 %   Eosinophils Absolute 0.2 0.0 - 0.5 K/uL   Basophils Relative 0 %   Basophils Absolute 0.0 0.0 - 0.1 K/uL    Immature Granulocytes 0 %   Abs Immature Granulocytes 0.02 0.00 - 0.07 K/uL  Protime-INR  Result Value Ref Range   Prothrombin Time 14.8 11.4 - 15.2 seconds   INR 1.2 0.8 - 1.2  Comprehensive metabolic panel  Result Value Ref Range   Sodium 136 135 - 145 mmol/L   Potassium 3.9 3.5 - 5.1 mmol/L   Chloride 101 98 - 111 mmol/L   CO2 28 22 - 32 mmol/L   Glucose, Bld 100 (H) 70 - 99 mg/dL   BUN 21 (H) 6 - 20 mg/dL   Creatinine, Ser 7.82 0.61 - 1.24 mg/dL   Calcium 9.2 8.9 - 95.6 mg/dL   Total Protein 6.9 6.5 - 8.1 g/dL   Albumin 3.1 (L) 3.5 - 5.0 g/dL   AST 79 (H) 15 - 41 U/L   ALT 114 (H) 0 - 44 U/L   Alkaline Phosphatase 77 38 - 126 U/L   Total Bilirubin 0.3 0.3 - 1.2 mg/dL   GFR, Estimated >21 >30 mL/min   Anion gap 7 5 - 15  CBC  Result Value Ref Range   WBC 6.3 4.0 - 10.5 K/uL   RBC 3.43 (L) 4.22 - 5.81 MIL/uL   Hemoglobin 9.4 (L) 13.0 - 17.0 g/dL   HCT 86.5 (L) 78.4 - 69.6 %   MCV 86.3 80.0 - 100.0 fL   MCH 27.4 26.0 - 34.0 pg   MCHC 31.8 30.0 - 36.0 g/dL   RDW 29.5 (H) 28.4 - 13.2 %   Platelets 215 150 - 400 K/uL   nRBC 0.0 0.0 - 0.2 %  Comprehensive metabolic panel  Result Value Ref Range   Sodium 138 135 - 145 mmol/L   Potassium 4.1 3.5 - 5.1 mmol/L   Chloride 97 (L) 98 - 111 mmol/L   CO2 29 22 - 32 mmol/L   Glucose, Bld 90 70 - 99 mg/dL   BUN 23 (H) 6 - 20 mg/dL   Creatinine, Ser 4.40 0.61 - 1.24 mg/dL   Calcium 9.6 8.9 - 10.2 mg/dL   Total Protein 6.9 6.5 - 8.1 g/dL   Albumin 3.1 (L) 3.5 - 5.0 g/dL   AST 77 (H) 15 - 41 U/L   ALT 121 (H) 0 - 44 U/L   Alkaline Phosphatase 74 38 - 126 U/L   Total Bilirubin 0.3 0.3 - 1.2 mg/dL   GFR, Estimated >72 >53 mL/min   Anion gap 12 5 - 15  Comprehensive metabolic panel  Result Value Ref Range   Sodium 138 135 - 145 mmol/L   Potassium 3.8 3.5 - 5.1  mmol/L   Chloride 102 98 - 111 mmol/L   CO2 30 22 - 32 mmol/L   Glucose, Bld 104 (H) 70 - 99 mg/dL   BUN 25 (H) 6 - 20 mg/dL   Creatinine, Ser 0.34 0.61 - 1.24 mg/dL    Calcium 9.2 8.9 - 74.2 mg/dL   Total Protein 6.7 6.5 - 8.1 g/dL   Albumin 3.0 (L) 3.5 - 5.0 g/dL   AST 70 (H) 15 - 41 U/L   ALT 117 (H) 0 - 44 U/L   Alkaline Phosphatase 92 38 - 126 U/L   Total Bilirubin 0.3 0.3 - 1.2 mg/dL   GFR, Estimated >59 >56 mL/min   Anion gap 6 5 - 15  CK  Result Value Ref Range   Total CK 19 (L) 49 - 397 U/L  Magnesium  Result Value Ref Range   Magnesium 1.9 1.7 - 2.4 mg/dL  CBC with Differential/Platelet  Result Value Ref Range   WBC 5.7 4.0 - 10.5 K/uL   RBC 3.27 (L) 4.22 - 5.81 MIL/uL   Hemoglobin 8.9 (L) 13.0 - 17.0 g/dL   HCT 38.7 (L) 56.4 - 33.2 %   MCV 88.1 80.0 - 100.0 fL   MCH 27.2 26.0 - 34.0 pg   MCHC 30.9 30.0 - 36.0 g/dL   RDW 95.1 (H) 88.4 - 16.6 %   Platelets 168 150 - 400 K/uL   nRBC 0.0 0.0 - 0.2 %   Neutrophils Relative % 59 %   Neutro Abs 3.3 1.7 - 7.7 K/uL   Lymphocytes Relative 30 %   Lymphs Abs 1.7 0.7 - 4.0 K/uL   Monocytes Relative 7 %   Monocytes Absolute 0.4 0.1 - 1.0 K/uL   Eosinophils Relative 4 %   Eosinophils Absolute 0.3 0.0 - 0.5 K/uL   Basophils Relative 0 %   Basophils Absolute 0.0 0.0 - 0.1 K/uL   Immature Granulocytes 0 %   Abs Immature Granulocytes 0.01 0.00 - 0.07 K/uL  Comprehensive metabolic panel  Result Value Ref Range   Sodium 137 135 - 145 mmol/L   Potassium 4.1 3.5 - 5.1 mmol/L   Chloride 101 98 - 111 mmol/L   CO2 30 22 - 32 mmol/L   Glucose, Bld 98 70 - 99 mg/dL   BUN 22 (H) 6 - 20 mg/dL   Creatinine, Ser 0.63 0.61 - 1.24 mg/dL   Calcium 9.0 8.9 - 01.6 mg/dL   Total Protein 6.9 6.5 - 8.1 g/dL   Albumin 3.0 (L) 3.5 - 5.0 g/dL   AST 72 (H) 15 - 41 U/L   ALT 119 (H) 0 - 44 U/L   Alkaline Phosphatase 82 38 - 126 U/L   Total Bilirubin 0.3 0.3 - 1.2 mg/dL   GFR, Estimated >01 >09 mL/min   Anion gap 6 5 - 15  Comprehensive metabolic panel  Result Value Ref Range   Sodium 138 135 - 145 mmol/L   Potassium 4.2 3.5 - 5.1 mmol/L   Chloride 103 98 - 111 mmol/L   CO2 31 22 - 32 mmol/L    Glucose, Bld 97 70 - 99 mg/dL   BUN 17 6 - 20 mg/dL   Creatinine, Ser 3.23 0.61 - 1.24 mg/dL   Calcium 9.2 8.9 - 55.7 mg/dL   Total Protein 6.8 6.5 - 8.1 g/dL   Albumin 3.1 (L) 3.5 - 5.0 g/dL   AST 76 (H) 15 - 41 U/L   ALT 128 (H) 0 - 44 U/L   Alkaline  Phosphatase 86 38 - 126 U/L   Total Bilirubin 0.4 0.3 - 1.2 mg/dL   GFR, Estimated >81 >19 mL/min   Anion gap 4 (L) 5 - 15  Protime-INR  Result Value Ref Range   Prothrombin Time 14.3 11.4 - 15.2 seconds   INR 1.1 0.8 - 1.2  Comprehensive metabolic panel  Result Value Ref Range   Sodium 135 135 - 145 mmol/L   Potassium 4.2 3.5 - 5.1 mmol/L   Chloride 101 98 - 111 mmol/L   CO2 29 22 - 32 mmol/L   Glucose, Bld 101 (H) 70 - 99 mg/dL   BUN 21 (H) 6 - 20 mg/dL   Creatinine, Ser 1.47 0.61 - 1.24 mg/dL   Calcium 9.1 8.9 - 82.9 mg/dL   Total Protein 6.9 6.5 - 8.1 g/dL   Albumin 3.0 (L) 3.5 - 5.0 g/dL   AST 54 (H) 15 - 41 U/L   ALT 108 (H) 0 - 44 U/L   Alkaline Phosphatase 75 38 - 126 U/L   Total Bilirubin 0.6 0.3 - 1.2 mg/dL   GFR, Estimated >56 >21 mL/min   Anion gap 5 5 - 15  Magnesium  Result Value Ref Range   Magnesium 1.8 1.7 - 2.4 mg/dL  CK  Result Value Ref Range   Total CK 14 (L) 49 - 397 U/L  CBC with Differential/Platelet  Result Value Ref Range   WBC 6.7 4.0 - 10.5 K/uL   RBC 3.22 (L) 4.22 - 5.81 MIL/uL   Hemoglobin 9.0 (L) 13.0 - 17.0 g/dL   HCT 30.8 (L) 65.7 - 84.6 %   MCV 87.3 80.0 - 100.0 fL   MCH 28.0 26.0 - 34.0 pg   MCHC 32.0 30.0 - 36.0 g/dL   RDW 96.2 (H) 95.2 - 84.1 %   Platelets 109 (L) 150 - 400 K/uL   nRBC 0.0 0.0 - 0.2 %   Neutrophils Relative % 68 %   Neutro Abs 4.5 1.7 - 7.7 K/uL   Lymphocytes Relative 19 %   Lymphs Abs 1.3 0.7 - 4.0 K/uL   Monocytes Relative 9 %   Monocytes Absolute 0.6 0.1 - 1.0 K/uL   Eosinophils Relative 4 %   Eosinophils Absolute 0.3 0.0 - 0.5 K/uL   Basophils Relative 0 %   Basophils Absolute 0.0 0.0 - 0.1 K/uL   Immature Granulocytes 0 %   Abs Immature  Granulocytes 0.03 0.00 - 0.07 K/uL  Procalcitonin - Baseline  Result Value Ref Range   Procalcitonin <0.10 ng/mL  Procalcitonin  Result Value Ref Range   Procalcitonin 0.19 ng/mL  CBC with Differential/Platelet  Result Value Ref Range   WBC 8.2 4.0 - 10.5 K/uL   RBC 3.52 (L) 4.22 - 5.81 MIL/uL   Hemoglobin 9.6 (L) 13.0 - 17.0 g/dL   HCT 32.4 (L) 40.1 - 02.7 %   MCV 87.8 80.0 - 100.0 fL   MCH 27.3 26.0 - 34.0 pg   MCHC 31.1 30.0 - 36.0 g/dL   RDW 25.3 (H) 66.4 - 40.3 %   Platelets 98 (L) 150 - 400 K/uL   nRBC 0.0 0.0 - 0.2 %   Neutrophils Relative % 71 %   Neutro Abs 5.8 1.7 - 7.7 K/uL   Lymphocytes Relative 18 %   Lymphs Abs 1.5 0.7 - 4.0 K/uL   Monocytes Relative 10 %   Monocytes Absolute 0.8 0.1 - 1.0 K/uL   Eosinophils Relative 1 %   Eosinophils Absolute 0.1 0.0 - 0.5 K/uL  Basophils Relative 0 %   Basophils Absolute 0.0 0.0 - 0.1 K/uL   Immature Granulocytes 0 %   Abs Immature Granulocytes 0.02 0.00 - 0.07 K/uL  Comprehensive metabolic panel  Result Value Ref Range   Sodium 135 135 - 145 mmol/L   Potassium 4.1 3.5 - 5.1 mmol/L   Chloride 100 98 - 111 mmol/L   CO2 26 22 - 32 mmol/L   Glucose, Bld 93 70 - 99 mg/dL   BUN 25 (H) 6 - 20 mg/dL   Creatinine, Ser 1.61 0.61 - 1.24 mg/dL   Calcium 9.1 8.9 - 09.6 mg/dL   Total Protein 7.0 6.5 - 8.1 g/dL   Albumin 3.0 (L) 3.5 - 5.0 g/dL   AST 55 (H) 15 - 41 U/L   ALT 103 (H) 0 - 44 U/L   Alkaline Phosphatase 86 38 - 126 U/L   Total Bilirubin 0.3 0.3 - 1.2 mg/dL   GFR, Estimated >04 >54 mL/min   Anion gap 9 5 - 15  Magnesium  Result Value Ref Range   Magnesium 2.0 1.7 - 2.4 mg/dL  Comprehensive metabolic panel  Result Value Ref Range   Sodium 138 135 - 145 mmol/L   Potassium 4.8 3.5 - 5.1 mmol/L   Chloride 103 98 - 111 mmol/L   CO2 26 22 - 32 mmol/L   Glucose, Bld 117 (H) 70 - 99 mg/dL   BUN 24 (H) 6 - 20 mg/dL   Creatinine, Ser 0.98 0.61 - 1.24 mg/dL   Calcium 9.5 8.9 - 11.9 mg/dL   Total Protein 7.2 6.5 - 8.1  g/dL   Albumin 3.0 (L) 3.5 - 5.0 g/dL   AST 44 (H) 15 - 41 U/L   ALT 95 (H) 0 - 44 U/L   Alkaline Phosphatase 72 38 - 126 U/L   Total Bilirubin 0.3 0.3 - 1.2 mg/dL   GFR, Estimated >14 >78 mL/min   Anion gap 9 5 - 15  Procalcitonin  Result Value Ref Range   Procalcitonin <0.10 ng/mL  CBC with Differential/Platelet  Result Value Ref Range   WBC 5.2 4.0 - 10.5 K/uL   RBC 3.14 (L) 4.22 - 5.81 MIL/uL   Hemoglobin 8.6 (L) 13.0 - 17.0 g/dL   HCT 29.5 (L) 62.1 - 30.8 %   MCV 85.0 80.0 - 100.0 fL   MCH 27.4 26.0 - 34.0 pg   MCHC 32.2 30.0 - 36.0 g/dL   RDW 65.7 (H) 84.6 - 96.2 %   Platelets 83 (L) 150 - 400 K/uL   nRBC 0.0 0.0 - 0.2 %   Neutrophils Relative % 67 %   Neutro Abs 3.5 1.7 - 7.7 K/uL   Lymphocytes Relative 19 %   Lymphs Abs 1.0 0.7 - 4.0 K/uL   Monocytes Relative 13 %   Monocytes Absolute 0.7 0.1 - 1.0 K/uL   Eosinophils Relative 1 %   Eosinophils Absolute 0.1 0.0 - 0.5 K/uL   Basophils Relative 0 %   Basophils Absolute 0.0 0.0 - 0.1 K/uL   Immature Granulocytes 0 %   Abs Immature Granulocytes 0.01 0.00 - 0.07 K/uL  Magnesium  Result Value Ref Range   Magnesium 1.7 1.7 - 2.4 mg/dL  Comprehensive metabolic panel  Result Value Ref Range   Sodium 133 (L) 135 - 145 mmol/L   Potassium 4.5 3.5 - 5.1 mmol/L   Chloride 101 98 - 111 mmol/L   CO2 25 22 - 32 mmol/L   Glucose, Bld 103 (H) 70 - 99  mg/dL   BUN 18 6 - 20 mg/dL   Creatinine, Ser 9.74 0.61 - 1.24 mg/dL   Calcium 8.8 (L) 8.9 - 10.3 mg/dL   Total Protein 6.5 6.5 - 8.1 g/dL   Albumin 2.7 (L) 3.5 - 5.0 g/dL   AST 40 15 - 41 U/L   ALT 83 (H) 0 - 44 U/L   Alkaline Phosphatase 70 38 - 126 U/L   Total Bilirubin 0.4 0.3 - 1.2 mg/dL   GFR, Estimated >16 >38 mL/min   Anion gap 7 5 - 15  CK  Result Value Ref Range   Total CK 15 (L) 49 - 397 U/L  CBC with Differential/Platelet  Result Value Ref Range   WBC 8.2 4.0 - 10.5 K/uL   RBC 3.29 (L) 4.22 - 5.81 MIL/uL   Hemoglobin 9.1 (L) 13.0 - 17.0 g/dL   HCT 45.3 (L)  64.6 - 52.0 %   MCV 85.1 80.0 - 100.0 fL   MCH 27.7 26.0 - 34.0 pg   MCHC 32.5 30.0 - 36.0 g/dL   RDW 80.3 (H) 21.2 - 24.8 %   Platelets 100 (L) 150 - 400 K/uL   nRBC 0.0 0.0 - 0.2 %   Neutrophils Relative % 73 %   Neutro Abs 6.0 1.7 - 7.7 K/uL   Lymphocytes Relative 17 %   Lymphs Abs 1.4 0.7 - 4.0 K/uL   Monocytes Relative 9 %   Monocytes Absolute 0.7 0.1 - 1.0 K/uL   Eosinophils Relative 1 %   Eosinophils Absolute 0.1 0.0 - 0.5 K/uL   Basophils Relative 0 %   Basophils Absolute 0.0 0.0 - 0.1 K/uL   Immature Granulocytes 0 %   Abs Immature Granulocytes 0.02 0.00 - 0.07 K/uL  Magnesium  Result Value Ref Range   Magnesium 2.0 1.7 - 2.4 mg/dL  Comprehensive metabolic panel  Result Value Ref Range   Sodium 135 135 - 145 mmol/L   Potassium 3.7 3.5 - 5.1 mmol/L   Chloride 101 98 - 111 mmol/L   CO2 25 22 - 32 mmol/L   Glucose, Bld 137 (H) 70 - 99 mg/dL   BUN 10 6 - 20 mg/dL   Creatinine, Ser 2.50 0.61 - 1.24 mg/dL   Calcium 9.0 8.9 - 03.7 mg/dL   Total Protein 7.2 6.5 - 8.1 g/dL   Albumin 3.0 (L) 3.5 - 5.0 g/dL   AST 28 15 - 41 U/L   ALT 68 (H) 0 - 44 U/L   Alkaline Phosphatase 70 38 - 126 U/L   Total Bilirubin 0.8 0.3 - 1.2 mg/dL   GFR, Estimated >04 >88 mL/min   Anion gap 9 5 - 15  CBC with Differential/Platelet  Result Value Ref Range   WBC 7.2 4.0 - 10.5 K/uL   RBC 3.05 (L) 4.22 - 5.81 MIL/uL   Hemoglobin 8.3 (L) 13.0 - 17.0 g/dL   HCT 89.1 (L) 69.4 - 50.3 %   MCV 83.9 80.0 - 100.0 fL   MCH 27.2 26.0 - 34.0 pg   MCHC 32.4 30.0 - 36.0 g/dL   RDW 88.8 (H) 28.0 - 03.4 %   Platelets 100 (L) 150 - 400 K/uL   nRBC 0.0 0.0 - 0.2 %   Neutrophils Relative % 66 %   Neutro Abs 4.7 1.7 - 7.7 K/uL   Lymphocytes Relative 21 %   Lymphs Abs 1.5 0.7 - 4.0 K/uL   Monocytes Relative 12 %   Monocytes Absolute 0.9 0.1 - 1.0 K/uL  Eosinophils Relative 1 %   Eosinophils Absolute 0.1 0.0 - 0.5 K/uL   Basophils Relative 0 %   Basophils Absolute 0.0 0.0 - 0.1 K/uL   Immature  Granulocytes 0 %   Abs Immature Granulocytes 0.02 0.00 - 0.07 K/uL  Magnesium  Result Value Ref Range   Magnesium 1.9 1.7 - 2.4 mg/dL  Comprehensive metabolic panel  Result Value Ref Range   Sodium 134 (L) 135 - 145 mmol/L   Potassium 3.6 3.5 - 5.1 mmol/L   Chloride 99 98 - 111 mmol/L   CO2 25 22 - 32 mmol/L   Glucose, Bld 99 70 - 99 mg/dL   BUN 15 6 - 20 mg/dL   Creatinine, Ser 0.79 0.61 - 1.24 mg/dL   Calcium 8.6 (L) 8.9 - 10.3 mg/dL   Total Protein 6.7 6.5 - 8.1 g/dL   Albumin 2.7 (L) 3.5 - 5.0 g/dL   AST 27 15 - 41 U/L   ALT 58 (H) 0 - 44 U/L   Alkaline Phosphatase 66 38 - 126 U/L   Total Bilirubin 0.5 0.3 - 1.2 mg/dL   GFR, Estimated >60 >60 mL/min   Anion gap 10 5 - 15  CBC with Differential/Platelet  Result Value Ref Range   WBC 6.1 4.0 - 10.5 K/uL   RBC 3.15 (L) 4.22 - 5.81 MIL/uL   Hemoglobin 8.5 (L) 13.0 - 17.0 g/dL   HCT 26.7 (L) 39.0 - 52.0 %   MCV 84.8 80.0 - 100.0 fL   MCH 27.0 26.0 - 34.0 pg   MCHC 31.8 30.0 - 36.0 g/dL   RDW 17.0 (H) 11.5 - 15.5 %   Platelets 113 (L) 150 - 400 K/uL   nRBC 0.0 0.0 - 0.2 %   Neutrophils Relative % 66 %   Neutro Abs 4.0 1.7 - 7.7 K/uL   Lymphocytes Relative 23 %   Lymphs Abs 1.4 0.7 - 4.0 K/uL   Monocytes Relative 10 %   Monocytes Absolute 0.6 0.1 - 1.0 K/uL   Eosinophils Relative 1 %   Eosinophils Absolute 0.1 0.0 - 0.5 K/uL   Basophils Relative 0 %   Basophils Absolute 0.0 0.0 - 0.1 K/uL   Immature Granulocytes 0 %   Abs Immature Granulocytes 0.02 0.00 - 0.07 K/uL  Magnesium  Result Value Ref Range   Magnesium 2.0 1.7 - 2.4 mg/dL  Comprehensive metabolic panel  Result Value Ref Range   Sodium 133 (L) 135 - 145 mmol/L   Potassium 5.2 (H) 3.5 - 5.1 mmol/L   Chloride 99 98 - 111 mmol/L   CO2 22 22 - 32 mmol/L   Glucose, Bld 91 70 - 99 mg/dL   BUN 13 6 - 20 mg/dL   Creatinine, Ser 0.82 0.61 - 1.24 mg/dL   Calcium 8.7 (L) 8.9 - 10.3 mg/dL   Total Protein 7.2 6.5 - 8.1 g/dL   Albumin 3.0 (L) 3.5 - 5.0 g/dL   AST  42 (H) 15 - 41 U/L   ALT 57 (H) 0 - 44 U/L   Alkaline Phosphatase 83 38 - 126 U/L   Total Bilirubin 0.6 0.3 - 1.2 mg/dL   GFR, Estimated >60 >60 mL/min   Anion gap 12 5 - 15  CBC with Differential/Platelet  Result Value Ref Range   WBC 7.2 4.0 - 10.5 K/uL   RBC 3.38 (L) 4.22 - 5.81 MIL/uL   Hemoglobin 9.0 (L) 13.0 - 17.0 g/dL   HCT 28.4 (L) 39.0 - 52.0 %  MCV 84.0 80.0 - 100.0 fL   MCH 26.6 26.0 - 34.0 pg   MCHC 31.7 30.0 - 36.0 g/dL   RDW 16.1 (H) 09.6 - 04.5 %   Platelets 153 150 - 400 K/uL   nRBC 0.0 0.0 - 0.2 %   Neutrophils Relative % 60 %   Neutro Abs 4.3 1.7 - 7.7 K/uL   Lymphocytes Relative 28 %   Lymphs Abs 2.0 0.7 - 4.0 K/uL   Monocytes Relative 11 %   Monocytes Absolute 0.8 0.1 - 1.0 K/uL   Eosinophils Relative 1 %   Eosinophils Absolute 0.1 0.0 - 0.5 K/uL   Basophils Relative 0 %   Basophils Absolute 0.0 0.0 - 0.1 K/uL   Immature Granulocytes 0 %   Abs Immature Granulocytes 0.03 0.00 - 0.07 K/uL  Magnesium  Result Value Ref Range   Magnesium 2.0 1.7 - 2.4 mg/dL  Comprehensive metabolic panel  Result Value Ref Range   Sodium 136 135 - 145 mmol/L   Potassium 3.7 3.5 - 5.1 mmol/L   Chloride 102 98 - 111 mmol/L   CO2 24 22 - 32 mmol/L   Glucose, Bld 98 70 - 99 mg/dL   BUN 13 6 - 20 mg/dL   Creatinine, Ser 4.09 0.61 - 1.24 mg/dL   Calcium 9.0 8.9 - 81.1 mg/dL   Total Protein 7.6 6.5 - 8.1 g/dL   Albumin 2.9 (L) 3.5 - 5.0 g/dL   AST 31 15 - 41 U/L   ALT 59 (H) 0 - 44 U/L   Alkaline Phosphatase 73 38 - 126 U/L   Total Bilirubin 0.2 (L) 0.3 - 1.2 mg/dL   GFR, Estimated >91 >47 mL/min   Anion gap 10 5 - 15  CBC with Differential/Platelet  Result Value Ref Range   WBC 5.0 4.0 - 10.5 K/uL   RBC 3.52 (L) 4.22 - 5.81 MIL/uL   Hemoglobin 9.5 (L) 13.0 - 17.0 g/dL   HCT 82.9 (L) 56.2 - 13.0 %   MCV 84.4 80.0 - 100.0 fL   MCH 27.0 26.0 - 34.0 pg   MCHC 32.0 30.0 - 36.0 g/dL   RDW 86.5 (H) 78.4 - 69.6 %   Platelets 151 150 - 400 K/uL   nRBC 0.0 0.0 - 0.2 %    Neutrophils Relative % 51 %   Neutro Abs 2.5 1.7 - 7.7 K/uL   Lymphocytes Relative 33 %   Lymphs Abs 1.7 0.7 - 4.0 K/uL   Monocytes Relative 10 %   Monocytes Absolute 0.5 0.1 - 1.0 K/uL   Eosinophils Relative 4 %   Eosinophils Absolute 0.2 0.0 - 0.5 K/uL   Basophils Relative 1 %   Basophils Absolute 0.0 0.0 - 0.1 K/uL   Immature Granulocytes 1 %   Abs Immature Granulocytes 0.03 0.00 - 0.07 K/uL  Magnesium  Result Value Ref Range   Magnesium 2.3 1.7 - 2.4 mg/dL  CBC with Differential/Platelet  Result Value Ref Range   WBC 7.1 4.0 - 10.5 K/uL   RBC 3.09 (L) 4.22 - 5.81 MIL/uL   Hemoglobin 8.3 (L) 13.0 - 17.0 g/dL   HCT 29.5 (L) 28.4 - 13.2 %   MCV 83.8 80.0 - 100.0 fL   MCH 26.9 26.0 - 34.0 pg   MCHC 32.0 30.0 - 36.0 g/dL   RDW 44.0 (H) 10.2 - 72.5 %   Platelets 179 150 - 400 K/uL   nRBC 0.0 0.0 - 0.2 %   Neutrophils Relative % 64 %  Neutro Abs 4.5 1.7 - 7.7 K/uL   Lymphocytes Relative 25 %   Lymphs Abs 1.8 0.7 - 4.0 K/uL   Monocytes Relative 8 %   Monocytes Absolute 0.6 0.1 - 1.0 K/uL   Eosinophils Relative 3 %   Eosinophils Absolute 0.2 0.0 - 0.5 K/uL   Basophils Relative 0 %   Basophils Absolute 0.0 0.0 - 0.1 K/uL   Immature Granulocytes 0 %   Abs Immature Granulocytes 0.03 0.00 - 0.07 K/uL   *Note: Due to a large number of results and/or encounters for the requested time period, some results have not been displayed. A complete set of results can be found in Results Review.

## 2022-06-10 ENCOUNTER — Ambulatory Visit: Payer: BLUE CROSS/BLUE SHIELD

## 2022-06-10 ENCOUNTER — Telehealth: Payer: Self-pay | Admitting: Internal Medicine

## 2022-06-10 ENCOUNTER — Other Ambulatory Visit: Payer: Self-pay

## 2022-06-10 DIAGNOSIS — Z952 Presence of prosthetic heart valve: Secondary | ICD-10-CM

## 2022-06-10 LAB — BUPRENORPHINE + DRUG SCREEN, UR
6-Acetylmorphine: NEGATIVE ng/mL
Amphetamines: NEGATIVE ng/mL
Barbiturates: NEGATIVE ng/mL
Benzodiazepines: NEGATIVE ng/mL
Buprenorphine: NEGATIVE ng/mL
Carisoprodol: NEGATIVE ng/mL
Cocaine Metabolite: NEGATIVE ng/mL
Creatinine: 89 mg/dL
Ethyl Glucuronide: NEGATIVE ng/mL
Fentanyl: NEGATIVE ng/mL
Gabapentin: POSITIVE ug/mL — AB
Marijuana MTB (THC): POSITIVE ng/mL — AB
Methadone: POSITIVE ng/mL — AB
Nitrites: NEGATIVE ug/mL
Opiates: NEGATIVE ng/mL
Oxycodone: NEGATIVE ng/mL
Phencyclidine (PCP): NEGATIVE ng/mL
Propoxyphene: NEGATIVE ng/mL
Tapentadol: NEGATIVE ng/mL
Tramadol: NEGATIVE ng/mL
Urine pH: 5.7 (ref 4.5–8.9)

## 2022-06-10 LAB — GABAPENTINOIDS, MS, UR RFX: Pregabalin: NOT DETECTED

## 2022-06-10 LAB — METHADONE, CONFIRMATION
Methadone Metabolite (EDDP): 699 ng/mg creat
Methadone: 1918 ng/mg creat

## 2022-06-10 LAB — CARBOXY-THC NORMALIZED RATIO: THC/CR Ratio: >1124 ng/mg creat

## 2022-06-10 LAB — AMPHETAMINES, CONFIRMATION
Amphetamine: NOT DETECTED ng/mg creat
MDA: NOT DETECTED ng/mg creat
MDMA: NOT DETECTED ng/mg creat
Methamphetamine: NOT DETECTED ng/mg creat

## 2022-06-10 NOTE — Telephone Encounter (Signed)
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 Referral will be closed.

## 2022-06-12 ENCOUNTER — Other Ambulatory Visit: Payer: Self-pay | Admitting: Internal Medicine

## 2022-06-12 ENCOUNTER — Ambulatory Visit: Payer: BLUE CROSS/BLUE SHIELD | Admitting: Internal Medicine

## 2022-06-13 ENCOUNTER — Ambulatory Visit: Payer: BLUE CROSS/BLUE SHIELD | Attending: Cardiovascular Disease

## 2022-06-13 DIAGNOSIS — Z5181 Encounter for therapeutic drug level monitoring: Secondary | ICD-10-CM | POA: Diagnosis not present

## 2022-06-13 DIAGNOSIS — I079 Rheumatic tricuspid valve disease, unspecified: Secondary | ICD-10-CM

## 2022-06-13 LAB — POCT INR: INR: 1.8 — AB (ref 2.0–3.0)

## 2022-06-13 NOTE — Patient Instructions (Addendum)
Description   Take 6mg  today and then START taking 4mg  daily EXCEPT 6mg  on Mondays, Wednesdays, and Fridays.  Stay consistent with greens each week [facility] [id number]     A full discussion of the nature of anticoagulants has been carried out.  A benefit risk analysis has been presented to the patient, so that they understand the justification for choosing anticoagulation at this time. The need for frequent and regular monitoring, precise dosage adjustment and compliance is stressed.  Side effects of potential bleeding are discussed.  The patient should avoid any OTC items containing aspirin or ibuprofen, and should avoid great swings in general diet.  Avoid alcohol consumption.  Call if any signs of abnormal bleeding.

## 2022-06-20 ENCOUNTER — Ambulatory Visit (INDEPENDENT_AMBULATORY_CARE_PROVIDER_SITE_OTHER): Payer: BLUE CROSS/BLUE SHIELD | Admitting: Internal Medicine

## 2022-06-20 ENCOUNTER — Ambulatory Visit: Payer: BLUE CROSS/BLUE SHIELD | Attending: Cardiology | Admitting: *Deleted

## 2022-06-20 ENCOUNTER — Encounter: Payer: Self-pay | Admitting: Internal Medicine

## 2022-06-20 ENCOUNTER — Ambulatory Visit: Payer: BLUE CROSS/BLUE SHIELD | Admitting: Internal Medicine

## 2022-06-20 VITALS — BP 111/71 | HR 83 | Temp 97.9°F | Resp 14 | Ht 70.0 in | Wt 198.8 lb

## 2022-06-20 DIAGNOSIS — E639 Nutritional deficiency, unspecified: Secondary | ICD-10-CM

## 2022-06-20 DIAGNOSIS — Z5181 Encounter for therapeutic drug level monitoring: Secondary | ICD-10-CM | POA: Diagnosis not present

## 2022-06-20 DIAGNOSIS — Z87891 Personal history of nicotine dependence: Secondary | ICD-10-CM | POA: Diagnosis not present

## 2022-06-20 DIAGNOSIS — D649 Anemia, unspecified: Secondary | ICD-10-CM

## 2022-06-20 DIAGNOSIS — B9562 Methicillin resistant Staphylococcus aureus infection as the cause of diseases classified elsewhere: Secondary | ICD-10-CM

## 2022-06-20 DIAGNOSIS — R7881 Bacteremia: Secondary | ICD-10-CM

## 2022-06-20 DIAGNOSIS — G894 Chronic pain syndrome: Secondary | ICD-10-CM | POA: Diagnosis not present

## 2022-06-20 DIAGNOSIS — R7989 Other specified abnormal findings of blood chemistry: Secondary | ICD-10-CM

## 2022-06-20 DIAGNOSIS — J15212 Pneumonia due to Methicillin resistant Staphylococcus aureus: Secondary | ICD-10-CM

## 2022-06-20 DIAGNOSIS — Z952 Presence of prosthetic heart valve: Secondary | ICD-10-CM

## 2022-06-20 DIAGNOSIS — R9431 Abnormal electrocardiogram [ECG] [EKG]: Secondary | ICD-10-CM

## 2022-06-20 DIAGNOSIS — K219 Gastro-esophageal reflux disease without esophagitis: Secondary | ICD-10-CM | POA: Diagnosis not present

## 2022-06-20 DIAGNOSIS — F1990 Other psychoactive substance use, unspecified, uncomplicated: Secondary | ICD-10-CM

## 2022-06-20 DIAGNOSIS — I079 Rheumatic tricuspid valve disease, unspecified: Secondary | ICD-10-CM

## 2022-06-20 DIAGNOSIS — B182 Chronic viral hepatitis C: Secondary | ICD-10-CM

## 2022-06-20 DIAGNOSIS — M62838 Other muscle spasm: Secondary | ICD-10-CM

## 2022-06-20 LAB — POCT INR: INR: 2.9 (ref 2.0–3.0)

## 2022-06-20 MED ORDER — FERROUS FUMARATE 324 (106 FE) MG PO TABS: 1.0000 | ORAL_TABLET | Freq: Every day | ORAL | 0 refills | Status: DC

## 2022-06-20 MED ORDER — ASCORBIC ACID 500 MG PO TABS: 500.0000 mg | ORAL_TABLET | Freq: Two times a day (BID) | ORAL | 0 refills | Status: DC

## 2022-06-20 MED ORDER — METHOCARBAMOL 500 MG PO TABS: 500.0000 mg | ORAL_TABLET | Freq: Three times a day (TID) | ORAL | 0 refills | Status: DC | PRN

## 2022-06-20 MED ORDER — CYANOCOBALAMIN 1000 MCG PO TABS: 1000.0000 ug | ORAL_TABLET | Freq: Two times a day (BID) | ORAL | 0 refills | Status: DC

## 2022-06-20 MED ORDER — FOLIC ACID 1 MG PO TABS: 1.0000 mg | ORAL_TABLET | Freq: Two times a day (BID) | ORAL | 0 refills | Status: DC

## 2022-06-20 MED ORDER — ESCITALOPRAM OXALATE 20 MG PO TABS: 20.0000 mg | ORAL_TABLET | Freq: Every day | ORAL | 0 refills | Status: DC

## 2022-06-20 MED ORDER — ONDANSETRON HCL 4 MG PO TABS: 4.0000 mg | ORAL_TABLET | Freq: Three times a day (TID) | ORAL | 2 refills | Status: DC | PRN

## 2022-06-20 NOTE — Progress Notes (Signed)
[facility]:  [phone]   Routine Medical Office Visit  Patient:  Reginald Clarke      Age: 31 y.o.       Sex:  male  Date:   06/20/2022  PCP:    Lula OlszewskiMorrison, Leva Baine G, MD    Today's Healthcare Provider: Lula Olszewskiyan G Jakobie Henslee, MD  Assessment/Plan:   Much more stable today.  Doing well limiting substance use to just cannabis.  Back in full methadone treatment program. He looks much better.  Cleaned up chart and arranging to make sure MRSA bacteremia and hep C get followed up because he says he doesn't have follow up with his infectious disease specialist appointment arranged yet  Reginald was seen today for one week follow-up and medication refill.  MRSA bacteremia Overview: Reports he is taking doxycycline Following with Dr. Ilsa IhaSnyder ID Was due to interruption in methadone treatment, has resumed and intends to permanently discontinue ivdu.   Orders: -     Ambulatory referral to Infectious Disease  Gastroesophageal reflux disease, unspecified whether esophagitis present Overview: Taking protonix since hospitalized but believes he stopped  Assessment & Plan: I discussed with Mr. Rolly SalterHaley the importance of tapering off Protonix if he is still on it but he does not think he is taking it anymore and is not having any heartburn.  I recommend that if he does get heartburn just taking Pepcid Complete now  Orders: -     DG Chest 2 View; Future -     Ascorbic Acid; Take 1 tablet (500 mg total) by mouth 2 (two) times daily with a meal.  Dispense: 30 tablet; Refill: 0 -     Cyanocobalamin; Take 1 tablet (1,000 mcg total) by mouth 2 (two) times daily with a meal.  Dispense: 30 tablet; Refill: 0 -     Escitalopram Oxalate; Take 1 tablet (20 mg total) by mouth daily.  Dispense: 30 tablet; Refill: 0 -     Ferrous Fumarate; Take 1 tablet (106 mg of iron total) by mouth daily.  Dispense: 30 tablet; Refill: 0 -     Folic Acid; Take 1 tablet (1 mg total) by mouth 2 (two) times daily  with a meal.  Dispense: 30 tablet; Refill: 0 -     Methocarbamol; Take 1 tablet (500 mg total) by mouth every 8 (eight) hours as needed for muscle spasms.  Dispense: 30 tablet; Refill: 0 -     Ondansetron HCl; Take 1 tablet (4 mg total) by mouth every 8 (eight) hours as needed.  Dispense: 20 tablet; Refill: 2  History of smoking  Chronic pain syndrome/methadone dependency Assessment & Plan: Strongly encouraged him to not suddenly stop methadone again but if he wants to he is clearly very motivated to stay sober okay with him talking with his methadone provider about appropriateness to reduce the dose wants to be without it but I did generally discourage total stopping until he has been a very very very slow taper  He is grateful for his kids his car which he likes to work on his mom's dad's environment and I encouraged him to every day before he goes to bed take a moment to think about how grateful he is for each of these and other things in his life.   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   Substance use disorder Overview: Presently in remission thinks to having a near death experience and getting treatment at the methadone clinic.  Assessment & Plan: He is already getting therapy the methadone clinic and I also showed him how to use Po on his phone to get more therapy.  I also offered to refer him for additional therapy in person with our therapist here as well as I offered psychiatry if he thinks he needs that.  Was not interested in for today but I let him know that I would make those referrals if he just calls back or contact me on MyChart with no appointment required.  Encouraged him to stay involved in therapy and support groups on a constant basis so that he can stay true to his goal of not dying and being there for the people he lives   Normocytic anemia Overview:   Lab Results  Component Value Date/Time   HGB 10.6 (L)  06/03/2022 02:33 AM   HGB 10.2 (L) 05/27/2022 04:52 AM   HGB 9.5 (L) 05/20/2022 03:42 AM   HGB 8.8 (L) 05/15/2022 03:40 AM   HGB 8.5 (L) 05/11/2022 03:47 AM      Orders: -     DG Chest 2 View; Future -     Ascorbic Acid; Take 1 tablet (500 mg total) by mouth 2 (two) times daily with a meal.  Dispense: 30 tablet; Refill: 0 -     Cyanocobalamin; Take 1 tablet (1,000 mcg total) by mouth 2 (two) times daily with a meal.  Dispense: 30 tablet; Refill: 0 -     Escitalopram Oxalate; Take 1 tablet (20 mg total) by mouth daily.  Dispense: 30 tablet; Refill: 0 -     Ferrous Fumarate; Take 1 tablet (106 mg of iron total) by mouth daily.  Dispense: 30 tablet; Refill: 0 -     Folic Acid; Take 1 tablet (1 mg total) by mouth 2 (two) times daily with a meal.  Dispense: 30 tablet; Refill: 0 -     Methocarbamol; Take 1 tablet (500 mg total) by mouth every 8 (eight) hours as needed for muscle spasms.  Dispense: 30 tablet; Refill: 0 -     Ondansetron HCl; Take 1 tablet (4 mg total) by mouth every 8 (eight) hours as needed.  Dispense: 20 tablet; Refill: 2  Prolonged QT interval Assessment & Plan: Declines qt recheck 06/20/22   Elevated serum creatinine Overview: Lab Results  Component Value Date/Time   CREATININE 0.76 06/03/2022 02:33 AM   CREATININE 0.79 05/27/2022 04:52 AM   CREATININE 1.10 05/20/2022 03:42 AM   CREATININE 0.77 05/15/2022 03:40 AM   CREATININE 0.91 05/11/2022 03:47 AM      Pneumonia of both lower lobes due to methicillin resistant Staphylococcus aureus (MRSA) ([name]) Assessment & Plan: Xray he had not cleared up when last checked in September 2023 although he does not have symptoms anymore I recommend that he get a repeat x-ray to make sure that the severe pneumonia has resolved  Orders: -     Ambulatory referral to Infectious Disease -     DG Chest 2 View; Future -     Ascorbic Acid; Take 1 tablet (500 mg total) by mouth 2 (two) times daily with a meal.  Dispense: 30 tablet;  Refill: 0 -     Cyanocobalamin; Take 1 tablet (1,000 mcg total) by mouth 2 (two) times daily with a meal.  Dispense: 30 tablet; Refill: 0 -     Escitalopram Oxalate; Take 1 tablet (20 mg total) by mouth daily.  Dispense: 30 tablet; Refill: 0 -     Ferrous Fumarate; Take  1 tablet (106 mg of iron total) by mouth daily.  Dispense: 30 tablet; Refill: 0 -     Folic Acid; Take 1 tablet (1 mg total) by mouth 2 (two) times daily with a meal.  Dispense: 30 tablet; Refill: 0 -     Methocarbamol; Take 1 tablet (500 mg total) by mouth every 8 (eight) hours as needed for muscle spasms.  Dispense: 30 tablet; Refill: 0 -     Ondansetron HCl; Take 1 tablet (4 mg total) by mouth every 8 (eight) hours as needed.  Dispense: 20 tablet; Refill: 2  Nutritional deficiency -     DG Chest 2 View; Future -     Ascorbic Acid; Take 1 tablet (500 mg total) by mouth 2 (two) times daily with a meal.  Dispense: 30 tablet; Refill: 0 -     Cyanocobalamin; Take 1 tablet (1,000 mcg total) by mouth 2 (two) times daily with a meal.  Dispense: 30 tablet; Refill: 0 -     Escitalopram Oxalate; Take 1 tablet (20 mg total) by mouth daily.  Dispense: 30 tablet; Refill: 0 -     Ferrous Fumarate; Take 1 tablet (106 mg of iron total) by mouth daily.  Dispense: 30 tablet; Refill: 0 -     Folic Acid; Take 1 tablet (1 mg total) by mouth 2 (two) times daily with a meal.  Dispense: 30 tablet; Refill: 0 -     Ondansetron HCl; Take 1 tablet (4 mg total) by mouth every 8 (eight) hours as needed.  Dispense: 20 tablet; Refill: 2  Chronic hepatitis C without hepatic coma ([name]) -     Ambulatory referral to Infectious Disease  Muscle spasm -     Methocarbamol; Take 1 tablet (500 mg total) by mouth every 8 (eight) hours as needed for muscle spasms.  Dispense: 30 tablet; Refill: 0     Return in about 3 months (around 09/20/2022) for Annual Exam.   Today's key discussion points summarized and made available in the After Visit Summary (AVS) Common side  effects, risks, benefits, and alternatives for medications and treatment plan prescribed today were discussed, and he expressed understanding of the given instructions.  He was encouraged to contact our office by phone or message via MyChart if he has any questions or concerns regarding our treatment plan (see AVS).  Medication list was reconciled and patient instructions and summary information was documented and made available for him to review in the AVS (see AVS).  This note is also available to patient for review for accuracy and understanding. We discussed red flag symptoms and signs in detail and when to call the office or go to ER if his condition worsens (see AFTER VISIT SUMMARY). He expressed understanding.  No barriers to understanding were identified     Subjective:   [place] Huckins is a 31 y.o. male with past medical history including: Past Medical History:  Diagnosis Date   Anxiety    Endocarditis    Hepatitis    hx of hep C per pt   Opiate dependence ([name])    on methadone   Smoking 12/25/2021     He is presenting today with: Chief Complaint  Patient presents with   One week follow-up    Everything has been better.   Medication Refill    Everything except the suboxone, seroquel, warfarin.     Problem focused charting was used to record today's medical interview as follows: Problem  Edentulous  Pneumonia of Both Lower  Lobes Due to Methicillin Resistant Staphylococcus Aureus (Mrsa) ([name])  Normocytic Anemia     Lab Results  Component Value Date/Time   HGB 10.6 (L) 06/03/2022 02:33 AM   HGB 10.2 (L) 05/27/2022 04:52 AM   HGB 9.5 (L) 05/20/2022 03:42 AM   HGB 8.8 (L) 05/15/2022 03:40 AM   HGB 8.5 (L) 05/11/2022 03:47 AM       Prolonged Qt Interval  Substance Use Disorder   Presently in remission thinks to having a near death experience and getting treatment at the methadone clinic.    History of Smoking  Tricuspid Valve Disorder   S/p repair 2023 Injury  Secondary to endocarditis.   Mrsa Bacteremia   Reports he is taking doxycycline Following with Dr. Ilsa Iha ID Was due to interruption in methadone treatment, has resumed and intends to permanently discontinue ivdu.    Chronic pain syndrome/methadone dependency  Gerd (Gastroesophageal Reflux Disease)   Taking protonix since hospitalized but believes he stopped   Shock ([name]) (Resolved)  Caries (Resolved)  Chronic Periodontitis (Resolved)  Accretions On Teeth (Resolved)  Impacted Third Molar Tooth (Resolved)  Phobia of Dental Procedure (Resolved)  Thrombocytopenia ([name]) (Resolved)  Ivdu (Intravenous Drug User) (Resolved)  Hypokalemia (Resolved)  Hypomagnesemia (Resolved)  Hyponatremia (Resolved)  Sepsis ([name]) (Resolved)  Elevated Serum Creatinine (Resolved)   Lab Results  Component Value Date/Time   CREATININE 0.76 06/03/2022 02:33 AM   CREATININE 0.79 05/27/2022 04:52 AM   CREATININE 1.10 05/20/2022 03:42 AM   CREATININE 0.77 05/15/2022 03:40 AM   CREATININE 0.91 05/11/2022 03:47 AM      Fever (Resolved)  Smoking (Resolved)  Anxiety (Resolved)  Lactic Acidosis (Resolved)  Heatstroke, Initial Encounter (Resolved)  Aki (Acute Kidney Injury) ([name]) (Resolved)  Leukocytosis (Resolved)    He reports that he feels much better than prior now back on methadone Got his car back.  Appreciates his kids and family         Objective:  Physical Exam: BP 111/71 (BP Location: Left Arm, Patient Position: Sitting)   Pulse 83   Temp 97.9 F (36.6 C) (Temporal)   Resp 14   Ht 5\' 10"  (1.778 m)   Wt 198 lb 12.8 oz (90.2 kg)   SpO2 98%   BMI 28.52 kg/m   Problem-specific physical exam findings:  Physical Exam Vitals and nursing note reviewed.  Constitutional:      General: He is not in acute distress.    Appearance: Normal appearance. He is not ill-appearing, toxic-appearing or diaphoretic.  HENT:     Head: Normocephalic and atraumatic.  Eyes:     General: No scleral  icterus.    Conjunctiva/sclera: Conjunctivae normal.  Skin:    General: Skin is warm and dry.  Neurological:     General: No focal deficit present.     Mental Status: He is alert.  Psychiatric:        Mood and Affect: Mood normal.        Behavior: Behavior normal.       Results Reviewed:  Results for orders placed or performed in visit on 06/20/22  POCT INR  Result Value Ref Range   INR 2.9 2.0 - 3.0   POC INR       Recent Results (from the past 2160 hour(s))  Comprehensive metabolic panel     Status: Abnormal   Collection Time: 03/25/22  4:10 AM  Result Value Ref Range   Sodium 138 135 - 145 mmol/L   Potassium 4.4 3.5 - 5.1 mmol/L  Chloride 103 98 - 111 mmol/L   CO2 29 22 - 32 mmol/L   Glucose, Bld 95 70 - 99 mg/dL    Comment: Glucose reference range applies only to samples taken after fasting for at least 8 hours.   BUN 33 (H) 6 - 20 mg/dL   Creatinine, Ser 3.87 0.61 - 1.24 mg/dL   Calcium 9.4 8.9 - 56.4 mg/dL   Total Protein 7.6 6.5 - 8.1 g/dL   Albumin 3.0 (L) 3.5 - 5.0 g/dL   AST 15 15 - 41 U/L   ALT 14 0 - 44 U/L   Alkaline Phosphatase 73 38 - 126 U/L   Total Bilirubin 0.3 0.3 - 1.2 mg/dL   GFR, Estimated >33 >29 mL/min    Comment: (NOTE) Calculated using the CKD-EPI Creatinine Equation (2021)    Anion gap 6 5 - 15    Comment: Performed at [facility], [address], [place], [place] [address]  Magnesium     Status: None   Collection Time: 03/25/22  4:10 AM  Result Value Ref Range   Magnesium 1.9 1.7 - 2.4 mg/dL    Comment: Performed at [facility], [address] [place], [place] [address]  CK     Status: Abnormal   Collection Time: 03/25/22  4:10 AM  Result Value Ref Range   Total CK 15 (L) 49 - 397 U/L    Comment: Performed at [facility], [address], [place], [place] [address]  CBC with Differential/Platelet     Status: Abnormal   Collection Time: 03/25/22  4:10 AM  Result Value Ref Range   WBC 7.6 4.0 - 10.5 K/uL    RBC 3.35 (L) 4.22 - 5.81 MIL/uL   Hemoglobin 8.9 (L) 13.0 - 17.0 g/dL   HCT 10.9 (L) 32.3 - 55.7 %   MCV 86.3 80.0 - 100.0 fL   MCH 26.6 26.0 - 34.0 pg   MCHC 30.8 30.0 - 36.0 g/dL   RDW 32.2 (H) 02.5 - 42.7 %   Platelets 272 150 - 400 K/uL   nRBC 0.0 0.0 - 0.2 %   Neutrophils Relative % 56 %   Neutro Abs 4.3 1.7 - 7.7 K/uL   Lymphocytes Relative 31 %   Lymphs Abs 2.4 0.7 - 4.0 K/uL   Monocytes Relative 9 %   Monocytes Absolute 0.7 0.1 - 1.0 K/uL   Eosinophils Relative 3 %   Eosinophils Absolute 0.2 0.0 - 0.5 K/uL   Basophils Relative 1 %   Basophils Absolute 0.0 0.0 - 0.1 K/uL   Immature Granulocytes 0 %   Abs Immature Granulocytes 0.02 0.00 - 0.07 K/uL    Comment: Performed at [facility], [address], [place], [place] [address]  Comprehensive metabolic panel     Status: Abnormal   Collection Time: 03/28/22  3:41 AM  Result Value Ref Range   Sodium 136 135 - 145 mmol/L   Potassium 4.5 3.5 - 5.1 mmol/L   Chloride 101 98 - 111 mmol/L   CO2 30 22 - 32 mmol/L   Glucose, Bld 103 (H) 70 - 99 mg/dL    Comment: Glucose reference range applies only to samples taken after fasting for at least 8 hours.   BUN 29 (H) 6 - 20 mg/dL   Creatinine, Ser 6.28 0.61 - 1.24 mg/dL   Calcium 9.3 8.9 - 31.5 mg/dL   Total Protein 7.6 6.5 - 8.1 g/dL   Albumin 3.1 (L)  3.5 - 5.0 g/dL   AST 27 15 - 41 U/L   ALT 26 0 - 44 U/L   Alkaline Phosphatase 80 38 - 126 U/L   Total Bilirubin 0.4 0.3 - 1.2 mg/dL   GFR, Estimated >09 >81 mL/min    Comment: (NOTE) Calculated using the CKD-EPI Creatinine Equation (2021)    Anion gap 5 5 - 15    Comment: Performed at [facility], [address], [place], [place] [address]  Magnesium     Status: None   Collection Time: 03/28/22  3:41 AM  Result Value Ref Range   Magnesium 2.0 1.7 - 2.4 mg/dL    Comment: Performed at [facility], [address] [place], [place] [address]  CK     Status: Abnormal   Collection Time: 03/28/22  3:41 AM   Result Value Ref Range   Total CK 22 (L) 49 - 397 U/L    Comment: Performed at [facility], [address], [place], [place] [address]  CBC with Differential/Platelet     Status: Abnormal   Collection Time: 03/28/22  3:41 AM  Result Value Ref Range   WBC 6.8 4.0 - 10.5 K/uL   RBC 3.44 (L) 4.22 - 5.81 MIL/uL   Hemoglobin 9.2 (L) 13.0 - 17.0 g/dL   HCT 65.7 (L) 84.6 - 96.2 %   MCV 86.6 80.0 - 100.0 fL   MCH 26.7 26.0 - 34.0 pg   MCHC 30.9 30.0 - 36.0 g/dL   RDW 95.2 (H) 84.1 - 32.4 %   Platelets 263 150 - 400 K/uL   nRBC 0.0 0.0 - 0.2 %   Neutrophils Relative % 56 %   Neutro Abs 3.8 1.7 - 7.7 K/uL   Lymphocytes Relative 32 %   Lymphs Abs 2.2 0.7 - 4.0 K/uL   Monocytes Relative 8 %   Monocytes Absolute 0.6 0.1 - 1.0 K/uL   Eosinophils Relative 3 %   Eosinophils Absolute 0.2 0.0 - 0.5 K/uL   Basophils Relative 1 %   Basophils Absolute 0.1 0.0 - 0.1 K/uL   Immature Granulocytes 0 %   Abs Immature Granulocytes 0.01 0.00 - 0.07 K/uL    Comment: Performed at [facility], [address] [place], [place] [address]  Comprehensive metabolic panel     Status: Abnormal   Collection Time: 04/01/22  3:30 AM  Result Value Ref Range   Sodium 136 135 - 145 mmol/L   Potassium 4.1 3.5 - 5.1 mmol/L   Chloride 102 98 - 111 mmol/L   CO2 29 22 - 32 mmol/L   Glucose, Bld 113 (H) 70 - 99 mg/dL    Comment: Glucose reference range applies only to samples taken after fasting for at least 8 hours.   BUN 24 (H) 6 - 20 mg/dL   Creatinine, Ser 7.25 0.61 - 1.24 mg/dL   Calcium 9.0 8.9 - 36.6 mg/dL   Total Protein 7.2 6.5 - 8.1 g/dL   Albumin 3.1 (L) 3.5 - 5.0 g/dL   AST 75 (H) 15 - 41 U/L   ALT 101 (H) 0 - 44 U/L   Alkaline Phosphatase 79 38 - 126 U/L   Total Bilirubin 0.4 0.3 - 1.2 mg/dL   GFR, Estimated >44 >03 mL/min    Comment: (NOTE) Calculated using the CKD-EPI Creatinine Equation (2021)    Anion gap 5 5 - 15    Comment: Performed at [facility], [address]  [place], [place] [address]  Magnesium     Status: None   Collection Time: 04/01/22  3:30 AM  Result Value Ref Range   Magnesium 1.9 1.7 - 2.4 mg/dL    Comment: Performed at [facility], [address], [place], [place] [address]  CK     Status: Abnormal   Collection Time: 04/01/22  3:30 AM  Result Value Ref Range   Total CK 21 (L) 49 - 397 U/L    Comment: Performed at [facility], [address], [place], [place] [address]  CBC with Differential/Platelet     Status: Abnormal   Collection Time: 04/01/22  3:30 AM  Result Value Ref Range   WBC 6.1 4.0 - 10.5 K/uL   RBC 3.39 (L) 4.22 - 5.81 MIL/uL   Hemoglobin 9.1 (L) 13.0 - 17.0 g/dL   HCT 91.4 (L) 78.2 - 95.6 %   MCV 86.4 80.0 - 100.0 fL   MCH 26.8 26.0 - 34.0 pg   MCHC 31.1 30.0 - 36.0 g/dL   RDW 21.3 (H) 08.6 - 57.8 %   Platelets 217 150 - 400 K/uL   nRBC 0.0 0.0 - 0.2 %   Neutrophils Relative % 54 %   Neutro Abs 3.3 1.7 - 7.7 K/uL   Lymphocytes Relative 35 %   Lymphs Abs 2.1 0.7 - 4.0 K/uL   Monocytes Relative 8 %   Monocytes Absolute 0.5 0.1 - 1.0 K/uL   Eosinophils Relative 3 %   Eosinophils Absolute 0.2 0.0 - 0.5 K/uL   Basophils Relative 0 %   Basophils Absolute 0.0 0.0 - 0.1 K/uL   Immature Granulocytes 0 %   Abs Immature Granulocytes 0.02 0.00 - 0.07 K/uL    Comment: Performed at [facility], [address], [place], [place] [address]  Protime-INR     Status: None   Collection Time: 04/01/22  9:31 AM  Result Value Ref Range   Prothrombin Time 14.8 11.4 - 15.2 seconds   INR 1.2 0.8 - 1.2    Comment: (NOTE) INR goal varies based on device and disease states. Performed at [facility], [address], [place], [place] [address]   Comprehensive metabolic panel     Status: Abnormal   Collection Time: 04/02/22  4:28 AM  Result Value Ref Range   Sodium 136 135 - 145 mmol/L   Potassium 3.9 3.5 - 5.1 mmol/L   Chloride 101 98 - 111 mmol/L   CO2 28 22 - 32 mmol/L   Glucose, Bld 100 (H) 70 - 99  mg/dL    Comment: Glucose reference range applies only to samples taken after fasting for at least 8 hours.   BUN 21 (H) 6 - 20 mg/dL   Creatinine, Ser 1.32 0.61 - 1.24 mg/dL   Calcium 9.2 8.9 - 44.0 mg/dL   Total Protein 6.9 6.5 - 8.1 g/dL   Albumin 3.1 (L) 3.5 - 5.0 g/dL   AST 79 (H) 15 - 41 U/L   ALT 114 (H) 0 - 44 U/L   Alkaline Phosphatase 77 38 - 126 U/L   Total Bilirubin 0.3 0.3 - 1.2 mg/dL   GFR, Estimated >10 >27 mL/min    Comment: (NOTE) Calculated using the CKD-EPI Creatinine Equation (2021)    Anion gap 7 5 - 15    Comment: Performed at [facility], [address], [place], [place] [address]  CBC     Status: Abnormal   Collection Time:  04/02/22  4:28 AM  Result Value Ref Range   WBC 6.3 4.0 - 10.5 K/uL   RBC 3.43 (L) 4.22 - 5.81 MIL/uL   Hemoglobin 9.4 (L) 13.0 - 17.0 g/dL   HCT 16.1 (L) 09.6 - 04.5 %   MCV 86.3 80.0 - 100.0 fL   MCH 27.4 26.0 - 34.0 pg   MCHC 31.8 30.0 - 36.0 g/dL   RDW 40.9 (H) 81.1 - 91.4 %   Platelets 215 150 - 400 K/uL   nRBC 0.0 0.0 - 0.2 %    Comment: Performed at [facility], [address], [place], [place] [address]  Comprehensive metabolic panel     Status: Abnormal   Collection Time: 04/03/22  5:22 AM  Result Value Ref Range   Sodium 138 135 - 145 mmol/L   Potassium 4.1 3.5 - 5.1 mmol/L   Chloride 97 (L) 98 - 111 mmol/L   CO2 29 22 - 32 mmol/L   Glucose, Bld 90 70 - 99 mg/dL    Comment: Glucose reference range applies only to samples taken after fasting for at least 8 hours.   BUN 23 (H) 6 - 20 mg/dL   Creatinine, Ser 6.21 0.61 - 1.24 mg/dL   Calcium 9.6 8.9 - 30.8 mg/dL   Total Protein 6.9 6.5 - 8.1 g/dL   Albumin 3.1 (L) 3.5 - 5.0 g/dL   AST 77 (H) 15 - 41 U/L   ALT 121 (H) 0 - 44 U/L   Alkaline Phosphatase 74 38 - 126 U/L   Total Bilirubin 0.3 0.3 - 1.2 mg/dL   GFR, Estimated >65 >78 mL/min    Comment: (NOTE) Calculated using the CKD-EPI Creatinine Equation (2021)    Anion gap 12 5 - 15    Comment:  Performed at [facility], [address], [place], [place] [address]  Comprehensive metabolic panel     Status: Abnormal   Collection Time: 04/04/22  4:02 AM  Result Value Ref Range   Sodium 138 135 - 145 mmol/L   Potassium 3.8 3.5 - 5.1 mmol/L   Chloride 102 98 - 111 mmol/L   CO2 30 22 - 32 mmol/L   Glucose, Bld 104 (H) 70 - 99 mg/dL    Comment: Glucose reference range applies only to samples taken after fasting for at least 8 hours.   BUN 25 (H) 6 - 20 mg/dL   Creatinine, Ser 9.52 0.61 - 1.24 mg/dL   Calcium 9.2 8.9 - 84.1 mg/dL   Total Protein 6.7 6.5 - 8.1 g/dL   Albumin 3.0 (L) 3.5 - 5.0 g/dL   AST 70 (H) 15 - 41 U/L   ALT 117 (H) 0 - 44 U/L   Alkaline Phosphatase 92 38 - 126 U/L   Total Bilirubin 0.3 0.3 - 1.2 mg/dL   GFR, Estimated >32 >44 mL/min    Comment: (NOTE) Calculated using the CKD-EPI Creatinine Equation (2021)    Anion gap 6 5 - 15    Comment: Performed at [facility], [address] [place], [place] [address]  CK     Status: Abnormal   Collection Time: 04/04/22  4:02 AM  Result Value Ref Range   Total CK 19 (L) 49 - 397 U/L    Comment: Performed at [facility], [address], [place], [place] [address]  Magnesium     Status: None   Collection Time: 04/04/22  4:02 AM  Result Value Ref Range  Magnesium 1.9 1.7 - 2.4 mg/dL    Comment: Performed at [facility], [address], [place], [place] [address]  CBC with Differential/Platelet     Status: Abnormal   Collection Time: 04/04/22  4:02 AM  Result Value Ref Range   WBC 5.7 4.0 - 10.5 K/uL   RBC 3.27 (L) 4.22 - 5.81 MIL/uL   Hemoglobin 8.9 (L) 13.0 - 17.0 g/dL   HCT 78.2 (L) 95.6 - 21.3 %   MCV 88.1 80.0 - 100.0 fL   MCH 27.2 26.0 - 34.0 pg   MCHC 30.9 30.0 - 36.0 g/dL   RDW 08.6 (H) 57.8 - 46.9 %   Platelets 168 150 - 400 K/uL   nRBC 0.0 0.0 - 0.2 %   Neutrophils Relative % 59 %   Neutro Abs 3.3 1.7 - 7.7 K/uL   Lymphocytes Relative 30 %   Lymphs Abs 1.7 0.7 - 4.0 K/uL    Monocytes Relative 7 %   Monocytes Absolute 0.4 0.1 - 1.0 K/uL   Eosinophils Relative 4 %   Eosinophils Absolute 0.3 0.0 - 0.5 K/uL   Basophils Relative 0 %   Basophils Absolute 0.0 0.0 - 0.1 K/uL   Immature Granulocytes 0 %   Abs Immature Granulocytes 0.01 0.00 - 0.07 K/uL    Comment: Performed at [facility], [address], [place], [place] [address]  Comprehensive metabolic panel     Status: Abnormal   Collection Time: 04/05/22  4:44 AM  Result Value Ref Range   Sodium 137 135 - 145 mmol/L   Potassium 4.1 3.5 - 5.1 mmol/L   Chloride 101 98 - 111 mmol/L   CO2 30 22 - 32 mmol/L   Glucose, Bld 98 70 - 99 mg/dL    Comment: Glucose reference range applies only to samples taken after fasting for at least 8 hours.   BUN 22 (H) 6 - 20 mg/dL   Creatinine, Ser 8.41 0.61 - 1.24 mg/dL   Calcium 9.0 8.9 - 32.4 mg/dL   Total Protein 6.9 6.5 - 8.1 g/dL   Albumin 3.0 (L) 3.5 - 5.0 g/dL   AST 72 (H) 15 - 41 U/L   ALT 119 (H) 0 - 44 U/L   Alkaline Phosphatase 82 38 - 126 U/L   Total Bilirubin 0.3 0.3 - 1.2 mg/dL   GFR, Estimated >40 >10 mL/min    Comment: (NOTE) Calculated using the CKD-EPI Creatinine Equation (2021)    Anion gap 6 5 - 15    Comment: Performed at [facility], [address], [place], [place] [address]  Comprehensive metabolic panel     Status: Abnormal   Collection Time: 04/06/22  4:49 AM  Result Value Ref Range   Sodium 138 135 - 145 mmol/L   Potassium 4.2 3.5 - 5.1 mmol/L   Chloride 103 98 - 111 mmol/L   CO2 31 22 - 32 mmol/L   Glucose, Bld 97 70 - 99 mg/dL    Comment: Glucose reference range applies only to samples taken after fasting for at least 8 hours.   BUN 17 6 - 20 mg/dL   Creatinine, Ser 6.64 0.61 - 1.24 mg/dL   Calcium 9.2 8.9 - 40.3 mg/dL   Total Protein 6.8 6.5 - 8.1 g/dL   Albumin 3.1 (L) 3.5 - 5.0 g/dL   AST 76 (H) 15 - 41 U/L   ALT 128 (H) 0 - 44 U/L   Alkaline Phosphatase 86 38 - 126  U/L   Total Bilirubin 0.4 0.3 - 1.2 mg/dL    GFR, Estimated >16 >10 mL/min    Comment: (NOTE) Calculated using the CKD-EPI Creatinine Equation (2021)    Anion gap 4 (L) 5 - 15    Comment: Performed at [facility], [name], [place], [place] [address]  Protime-INR     Status: None   Collection Time: 04/06/22  4:49 AM  Result Value Ref Range   Prothrombin Time 14.3 11.4 - 15.2 seconds   INR 1.1 0.8 - 1.2    Comment: (NOTE) INR goal varies based on device and disease states. Performed at [facility], [address] [place], [place] [address]   Comprehensive metabolic panel     Status: Abnormal   Collection Time: 04/08/22  4:30 AM  Result Value Ref Range   Sodium 135 135 - 145 mmol/L   Potassium 4.2 3.5 - 5.1 mmol/L   Chloride 101 98 - 111 mmol/L   CO2 29 22 - 32 mmol/L   Glucose, Bld 101 (H) 70 - 99 mg/dL    Comment: Glucose reference range applies only to samples taken after fasting for at least 8 hours.   BUN 21 (H) 6 - 20 mg/dL   Creatinine, Ser 1.91 0.61 - 1.24 mg/dL   Calcium 9.1 8.9 - 47.8 mg/dL   Total Protein 6.9 6.5 - 8.1 g/dL   Albumin 3.0 (L) 3.5 - 5.0 g/dL   AST 54 (H) 15 - 41 U/L   ALT 108 (H) 0 - 44 U/L   Alkaline Phosphatase 75 38 - 126 U/L   Total Bilirubin 0.6 0.3 - 1.2 mg/dL   GFR, Estimated >29 >56 mL/min    Comment: (NOTE) Calculated using the CKD-EPI Creatinine Equation (2021)    Anion gap 5 5 - 15    Comment: Performed at [facility], [address] [place], [place] [address]  Magnesium     Status: None   Collection Time: 04/08/22  4:30 AM  Result Value Ref Range   Magnesium 1.8 1.7 - 2.4 mg/dL    Comment: Performed at [facility], [address] [place], [place] [address]  CK     Status: Abnormal   Collection Time: 04/08/22  4:30 AM  Result Value Ref Range   Total CK 14 (L) 49 - 397 U/L    Comment: Performed at [facility], [address], [place], [place] [address]  CBC with Differential/Platelet     Status: Abnormal   Collection Time: 04/08/22   4:30 AM  Result Value Ref Range   WBC 6.7 4.0 - 10.5 K/uL   RBC 3.22 (L) 4.22 - 5.81 MIL/uL   Hemoglobin 9.0 (L) 13.0 - 17.0 g/dL   HCT 52.8 (L) 41.3 - 24.4 %   MCV 87.3 80.0 - 100.0 fL   MCH 28.0 26.0 - 34.0 pg   MCHC 32.0 30.0 - 36.0 g/dL   RDW 01.0 (H) 27.2 - 53.6 %   Platelets 109 (L) 150 - 400 K/uL   nRBC 0.0 0.0 - 0.2 %   Neutrophils Relative % 68 %   Neutro Abs 4.5 1.7 - 7.7 K/uL   Lymphocytes Relative 19 %   Lymphs Abs 1.3 0.7 - 4.0 K/uL   Monocytes Relative 9 %   Monocytes Absolute 0.6 0.1 - 1.0 K/uL   Eosinophils Relative 4 %   Eosinophils Absolute 0.3 0.0 - 0.5 K/uL   Basophils Relative 0 %  Basophils Absolute 0.0 0.0 - 0.1 K/uL   Immature Granulocytes 0 %   Abs Immature Granulocytes 0.03 0.00 - 0.07 K/uL    Comment: Performed at [facility], [address], [place], [place] [address]  ECHOCARDIOGRAM LIMITED     Status: None   Collection Time: 04/08/22  4:10 PM  Result Value Ref Range   Weight 2,768.98 oz   Height 70 in   BP 103/70 mmHg  Procalcitonin - Baseline     Status: None   Collection Time: 04/08/22  6:48 PM  Result Value Ref Range   Procalcitonin <0.10 ng/mL    Comment:        Interpretation: PCT (Procalcitonin) <= 0.5 ng/mL: Systemic infection (sepsis) is not likely. Local bacterial infection is possible. (NOTE)       Sepsis PCT Algorithm           Lower Respiratory Tract                                      Infection PCT Algorithm    ----------------------------     ----------------------------         PCT < 0.25 ng/mL                PCT < 0.10 ng/mL          Strongly encourage             Strongly discourage   discontinuation of antibiotics    initiation of antibiotics    ----------------------------     -----------------------------       PCT 0.25 - 0.50 ng/mL            PCT 0.10 - 0.25 ng/mL               OR       >80% decrease in PCT            Discourage initiation of                                            antibiotics       Encourage discontinuation           of antibiotics    ----------------------------     -----------------------------         PCT >= 0.50 ng/mL              PCT 0.26 - 0.50 ng/mL               AND        <80% decrease in PCT             Encourage initiation of                                             antibiotics       Encourage continuation           of antibiotics    ----------------------------     -----------------------------        PCT >= 0.50 ng/mL                  PCT > 0.50 ng/mL  AND         increase in PCT                  Strongly encourage                                      initiation of antibiotics    Strongly encourage escalation           of antibiotics                                     -----------------------------                                           PCT <= 0.25 ng/mL                                                 OR                                        > 80% decrease in PCT                                      Discontinue / Do not initiate                                             antibiotics  Performed at [facility], [address], [place], [place] [address]   Comprehensive metabolic panel     Status: Abnormal   Collection Time: 04/08/22  6:48 PM  Result Value Ref Range   Sodium 135 135 - 145 mmol/L   Potassium 4.1 3.5 - 5.1 mmol/L   Chloride 100 98 - 111 mmol/L   CO2 26 22 - 32 mmol/L   Glucose, Bld 93 70 - 99 mg/dL    Comment: Glucose reference range applies only to samples taken after fasting for at least 8 hours.   BUN 25 (H) 6 - 20 mg/dL   Creatinine, Ser 6.04 0.61 - 1.24 mg/dL   Calcium 9.1 8.9 - 54.0 mg/dL   Total Protein 7.0 6.5 - 8.1 g/dL   Albumin 3.0 (L) 3.5 - 5.0 g/dL   AST 55 (H) 15 - 41 U/L   ALT 103 (H) 0 - 44 U/L   Alkaline Phosphatase 86 38 - 126 U/L   Total Bilirubin 0.3 0.3 - 1.2 mg/dL   GFR, Estimated >98 >11 mL/min    Comment: (NOTE) Calculated using the CKD-EPI Creatinine Equation (2021)     Anion gap 9 5 - 15    Comment: Performed at [facility], [address], [place], [place] [address]  Culture, blood (Routine X 2) w Reflex to ID Panel     Status: Abnormal   Collection Time: 04/08/22  7:12  PM   Specimen: BLOOD  Result Value Ref Range   Specimen Description BLOOD BLOOD LEFT FOREARM    Special Requests      BOTTLES DRAWN AEROBIC AND ANAEROBIC Blood Culture results may not be optimal due to an inadequate volume of blood received in culture bottles ARPEDB USED AS AEROBIC BOTTLE   Culture  Setup Time      GRAM POSITIVE COCCI IN CLUSTERS IN BOTH AEROBIC AND ANAEROBIC BOTTLES CRITICAL RESULT CALLED TO, READ BACK BY AND VERIFIED WITH: PHARMD Lavonne Chick @1728  FH    Culture (A)     STAPHYLOCOCCUS AUREUS SUSCEPTIBILITIES PERFORMED ON PREVIOUS CULTURE WITHIN THE LAST 5 DAYS. Performed at [facility], [address], [place], [place] [address]    Report Status 04/12/2022 FINAL   Culture, blood (Routine X 2) w Reflex to ID Panel     Status: Abnormal   Collection Time: 04/08/22  7:12 PM   Specimen: BLOOD  Result Value Ref Range   Specimen Description BLOOD BLOOD LEFT FOREARM    Special Requests      BOTTLES DRAWN AEROBIC AND ANAEROBIC Blood Culture results may not be optimal due to an inadequate volume of blood received in culture bottles ARPEDB USED AS AEROBIC   Culture  Setup Time      GRAM POSITIVE COCCI IN CLUSTERS IN BOTH AEROBIC AND ANAEROBIC BOTTLES CRITICAL RESULT CALLED TO, READ BACK BY AND VERIFIED WITH: PHARMD MADELEINE MITCHELL ON 04/09/22 @ 1625 BY DRT    Culture (A)     METHICILLIN RESISTANT STAPHYLOCOCCUS AUREUS RETEST CONFIRMED MODIFICATION OF PBP (MECA) SEE SEPARATE REPORT Performed at [facility], [address] [place], [place] [address]    Report Status 04/22/2022 FINAL    Organism ID, Bacteria METHICILLIN RESISTANT STAPHYLOCOCCUS AUREUS       Susceptibility   Methicillin resistant staphylococcus aureus - MIC*     CIPROFLOXACIN <=0.5 SENSITIVE Sensitive     ERYTHROMYCIN >=8 RESISTANT Resistant     GENTAMICIN <=0.5 SENSITIVE Sensitive     OXACILLIN RESISTANT Resistant     TETRACYCLINE <=1 SENSITIVE Sensitive     VANCOMYCIN 2 SENSITIVE Sensitive     TRIMETH/SULFA <=10 SENSITIVE Sensitive     CLINDAMYCIN <=0.25 SENSITIVE Sensitive     RIFAMPIN <=0.5 SENSITIVE Sensitive     Inducible Clindamycin NEGATIVE Sensitive     * METHICILLIN RESISTANT STAPHYLOCOCCUS AUREUS  Blood Culture ID Panel (Reflexed)     Status: Abnormal   Collection Time: 04/08/22  7:12 PM  Result Value Ref Range   Enterococcus faecalis NOT DETECTED NOT DETECTED   Enterococcus Faecium NOT DETECTED NOT DETECTED   Listeria monocytogenes NOT DETECTED NOT DETECTED   Staphylococcus species DETECTED (A) NOT DETECTED    Comment: CRITICAL RESULT CALLED TO, READ BACK BY AND VERIFIED WITH: PHARMD MADELEINE MITCHELL ON 04/09/22 @ 1625 BY DRT    Staphylococcus aureus (BCID) DETECTED (A) NOT DETECTED    Comment: CRITICAL RESULT CALLED TO, READ BACK BY AND VERIFIED WITH: PHARMD MADELEINE MITCHELL ON 04/09/22 @ 1625 BY DRT    Staphylococcus epidermidis NOT DETECTED NOT DETECTED   Staphylococcus lugdunensis NOT DETECTED NOT DETECTED   Streptococcus species NOT DETECTED NOT DETECTED   Streptococcus agalactiae NOT DETECTED NOT DETECTED   Streptococcus pneumoniae NOT DETECTED NOT DETECTED   Streptococcus pyogenes NOT DETECTED NOT DETECTED   A.calcoaceticus-baumannii NOT DETECTED NOT DETECTED   Bacteroides fragilis NOT DETECTED NOT DETECTED   Enterobacterales NOT DETECTED NOT DETECTED   Enterobacter cloacae complex  NOT DETECTED NOT DETECTED   Escherichia coli NOT DETECTED NOT DETECTED   Klebsiella aerogenes NOT DETECTED NOT DETECTED   Klebsiella oxytoca NOT DETECTED NOT DETECTED   Klebsiella pneumoniae NOT DETECTED NOT DETECTED   Proteus species NOT DETECTED NOT DETECTED   Salmonella species NOT DETECTED NOT DETECTED   Serratia marcescens NOT  DETECTED NOT DETECTED   Haemophilus influenzae NOT DETECTED NOT DETECTED   Neisseria meningitidis NOT DETECTED NOT DETECTED   Pseudomonas aeruginosa NOT DETECTED NOT DETECTED   Stenotrophomonas maltophilia NOT DETECTED NOT DETECTED   Candida albicans NOT DETECTED NOT DETECTED   Candida auris NOT DETECTED NOT DETECTED   Candida glabrata NOT DETECTED NOT DETECTED   Candida krusei NOT DETECTED NOT DETECTED   Candida parapsilosis NOT DETECTED NOT DETECTED   Candida tropicalis NOT DETECTED NOT DETECTED   Cryptococcus neoformans/gattii NOT DETECTED NOT DETECTED   Meth resistant mecA/C and MREJ NOT DETECTED NOT DETECTED    Comment: Performed at [facility], [address], [place], [place] [address]  Min Inhibitory Conc (2 Drugs)     Status: Abnormal   Collection Time: 04/08/22  7:12 PM  Result Value Ref Range   Min Inhibitory Conc (2 Drugs) Final report (A)     Comment: (NOTE) Performed At: [facility] [address] [place], [place] [id number] Jolene Schimke MD [id number] CORRECTED ON 09/21 AT 1137: PREVIOUSLY REPORTED AS Preliminary report    Source (MIC2) [id number]     Comment: Performed at [facility], [address] [place], [place] [address]  MIC Results (2 Drugs)     Status: Abnormal   Collection Time: 04/08/22  7:12 PM  Result Value Ref Range   RESULT 5 MIC (RESULT 1)                Base Name: Comment (A)     Comment: (NOTE) Methicillin - resistant Staphylococcus aureus Identification performed by account, not confirmed by this laboratory. CEFTAROLINE    <=1 UG/ML = SUSCEPTIBLE DAPTOMYCIN      >1 UG/ML = NON-SUSCEPTIBLE Performed At: [facility] [address] [place], [place] [id number] Jolene Schimke MD [id number]   SARS Coronavirus 2 by RT PCR (hospital order, performed in [facility] hospital lab) *cepheid single result test* Anterior Nasal Swab     Status: None   Collection Time: 04/08/22  8:52 PM   Specimen: Anterior Nasal Swab   Result Value Ref Range   SARS Coronavirus 2 by RT PCR NEGATIVE NEGATIVE    Comment: (NOTE) SARS-CoV-2 target nucleic acids are NOT DETECTED.  The SARS-CoV-2 RNA is generally detectable in upper and lower respiratory specimens during the acute phase of infection. The lowest concentration of SARS-CoV-2 viral copies this assay can detect is 250 copies / mL. A negative result does not preclude SARS-CoV-2 infection and should not be used as the sole basis for treatment or other patient management decisions.  A negative result may occur with improper specimen collection / handling, submission of specimen other than nasopharyngeal swab, presence of viral mutation(s) within the areas targeted by this assay, and inadequate number of viral copies (<250 copies / mL). A negative result must be combined with clinical observations, patient history, and epidemiological information.  Fact Sheet for Patients:   RoadLapTop.co.za  Fact Sheet for Healthcare Providers: http://kim-miller.com/  This test is not yet approved or  cleared by the [place] FDA and has been authorized for detection and/or diagnosis of SARS-CoV-2 by FDA under an Emergency Use  Authorization (EUA).  This EUA will remain in effect (meaning this test can be used) for the duration of the COVID-19 declaration under Section 564(b)(1) of the Act, 21 U.S.C. section 360bbb-3(b)(1), unless the authorization is terminated or revoked sooner.  Performed at [facility], [address], [place], [place] [address]   Procalcitonin     Status: None   Collection Time: 04/09/22  3:43 AM  Result Value Ref Range   Procalcitonin 0.19 ng/mL    Comment:        Interpretation: PCT (Procalcitonin) <= 0.5 ng/mL: Systemic infection (sepsis) is not likely. Local bacterial infection is possible. (NOTE)       Sepsis PCT Algorithm           Lower Respiratory Tract                                       Infection PCT Algorithm    ----------------------------     ----------------------------         PCT < 0.25 ng/mL                PCT < 0.10 ng/mL          Strongly encourage             Strongly discourage   discontinuation of antibiotics    initiation of antibiotics    ----------------------------     -----------------------------       PCT 0.25 - 0.50 ng/mL            PCT 0.10 - 0.25 ng/mL               OR       >80% decrease in PCT            Discourage initiation of                                            antibiotics      Encourage discontinuation           of antibiotics    ----------------------------     -----------------------------         PCT >= 0.50 ng/mL              PCT 0.26 - 0.50 ng/mL               AND        <80% decrease in PCT             Encourage initiation of                                             antibiotics       Encourage continuation           of antibiotics    ----------------------------     -----------------------------        PCT >= 0.50 ng/mL                  PCT > 0.50 ng/mL               AND         increase in PCT  Strongly encourage                                      initiation of antibiotics    Strongly encourage escalation           of antibiotics                                     -----------------------------                                           PCT <= 0.25 ng/mL                                                 OR                                        > 80% decrease in PCT                                      Discontinue / Do not initiate                                             antibiotics  Performed at [facility], [address] [place], [place] [address]   CBC with Differential/Platelet     Status: Abnormal   Collection Time: 04/09/22  3:43 AM  Result Value Ref Range   WBC 8.2 4.0 - 10.5 K/uL   RBC 3.52 (L) 4.22 - 5.81 MIL/uL   Hemoglobin 9.6 (L) 13.0 - 17.0 g/dL   HCT 19.1 (L) 47.8  - 52.0 %   MCV 87.8 80.0 - 100.0 fL   MCH 27.3 26.0 - 34.0 pg   MCHC 31.1 30.0 - 36.0 g/dL   RDW 29.5 (H) 62.1 - 30.8 %   Platelets 98 (L) 150 - 400 K/uL    Comment: Immature Platelet Fraction may be clinically indicated, consider ordering this additional test [id number] CONSISTENT WITH PREVIOUS RESULT REPEATED TO VERIFY    nRBC 0.0 0.0 - 0.2 %   Neutrophils Relative % 71 %   Neutro Abs 5.8 1.7 - 7.7 K/uL   Lymphocytes Relative 18 %   Lymphs Abs 1.5 0.7 - 4.0 K/uL   Monocytes Relative 10 %   Monocytes Absolute 0.8 0.1 - 1.0 K/uL   Eosinophils Relative 1 %   Eosinophils Absolute 0.1 0.0 - 0.5 K/uL   Basophils Relative 0 %   Basophils Absolute 0.0 0.0 - 0.1 K/uL   Immature Granulocytes 0 %   Abs Immature Granulocytes 0.02 0.00 - 0.07 K/uL    Comment: Performed at [facility], [address] [place], [place] [address]  Magnesium     Status: None   Collection Time: 04/09/22  3:43 AM  Result Value  Ref Range   Magnesium 2.0 1.7 - 2.4 mg/dL    Comment: Performed at [facility], [address], [place], [place] [address]  Comprehensive metabolic panel     Status: Abnormal   Collection Time: 04/09/22  3:43 AM  Result Value Ref Range   Sodium 138 135 - 145 mmol/L   Potassium 4.8 3.5 - 5.1 mmol/L   Chloride 103 98 - 111 mmol/L   CO2 26 22 - 32 mmol/L   Glucose, Bld 117 (H) 70 - 99 mg/dL    Comment: Glucose reference range applies only to samples taken after fasting for at least 8 hours.   BUN 24 (H) 6 - 20 mg/dL   Creatinine, Ser 6.04 0.61 - 1.24 mg/dL   Calcium 9.5 8.9 - 54.0 mg/dL   Total Protein 7.2 6.5 - 8.1 g/dL   Albumin 3.0 (L) 3.5 - 5.0 g/dL   AST 44 (H) 15 - 41 U/L   ALT 95 (H) 0 - 44 U/L   Alkaline Phosphatase 72 38 - 126 U/L   Total Bilirubin 0.3 0.3 - 1.2 mg/dL   GFR, Estimated >98 >11 mL/min    Comment: (NOTE) Calculated using the CKD-EPI Creatinine Equation (2021)    Anion gap 9 5 - 15    Comment: Performed at [facility], [address], [place], [place] [address]  Procalcitonin     Status: None   Collection Time: 04/10/22  8:02 AM  Result Value Ref Range   Procalcitonin <0.10 ng/mL    Comment:        Interpretation: PCT (Procalcitonin) <= 0.5 ng/mL: Systemic infection (sepsis) is not likely. Local bacterial infection is possible. (NOTE)       Sepsis PCT Algorithm           Lower Respiratory Tract                                      Infection PCT Algorithm    ----------------------------     ----------------------------         PCT < 0.25 ng/mL                PCT < 0.10 ng/mL          Strongly encourage             Strongly discourage   discontinuation of antibiotics    initiation of antibiotics    ----------------------------     -----------------------------       PCT 0.25 - 0.50 ng/mL            PCT 0.10 - 0.25 ng/mL               OR       >80% decrease in PCT            Discourage initiation of                                            antibiotics      Encourage discontinuation           of antibiotics    ----------------------------     -----------------------------         PCT >= 0.50 ng/mL  PCT 0.26 - 0.50 ng/mL               AND        <80% decrease in PCT             Encourage initiation of                                             antibiotics       Encourage continuation           of antibiotics    ----------------------------     -----------------------------        PCT >= 0.50 ng/mL                  PCT > 0.50 ng/mL               AND         increase in PCT                  Strongly encourage                                      initiation of antibiotics    Strongly encourage escalation           of antibiotics                                     -----------------------------                                           PCT <= 0.25 ng/mL                                                 OR                                        > 80% decrease in PCT                                       Discontinue / Do not initiate                                             antibiotics  Performed at [facility], [address], [place], [place] [address]   CBC with Differential/Platelet     Status: Abnormal   Collection Time: 04/10/22  8:02 AM  Result Value Ref Range   WBC 5.2 4.0 - 10.5 K/uL   RBC 3.14 (L) 4.22 - 5.81 MIL/uL   Hemoglobin 8.6 (L) 13.0 - 17.0 g/dL   HCT 05.3 (L) 97.6 - 73.4 %   MCV 85.0 80.0 - 100.0 fL  MCH 27.4 26.0 - 34.0 pg   MCHC 32.2 30.0 - 36.0 g/dL   RDW 16.1 (H) 09.6 - 04.5 %   Platelets 83 (L) 150 - 400 K/uL    Comment: Immature Platelet Fraction may be clinically indicated, consider ordering this additional test [id number] REPEATED TO VERIFY    nRBC 0.0 0.0 - 0.2 %   Neutrophils Relative % 67 %   Neutro Abs 3.5 1.7 - 7.7 K/uL   Lymphocytes Relative 19 %   Lymphs Abs 1.0 0.7 - 4.0 K/uL   Monocytes Relative 13 %   Monocytes Absolute 0.7 0.1 - 1.0 K/uL   Eosinophils Relative 1 %   Eosinophils Absolute 0.1 0.0 - 0.5 K/uL   Basophils Relative 0 %   Basophils Absolute 0.0 0.0 - 0.1 K/uL   Immature Granulocytes 0 %   Abs Immature Granulocytes 0.01 0.00 - 0.07 K/uL    Comment: Performed at [facility], [address], [place], [place] [address]  Magnesium     Status: None   Collection Time: 04/10/22  8:02 AM  Result Value Ref Range   Magnesium 1.7 1.7 - 2.4 mg/dL    Comment: Performed at [facility], [address] [place], [place] [address]  Comprehensive metabolic panel     Status: Abnormal   Collection Time: 04/10/22  8:02 AM  Result Value Ref Range   Sodium 133 (L) 135 - 145 mmol/L   Potassium 4.5 3.5 - 5.1 mmol/L   Chloride 101 98 - 111 mmol/L   CO2 25 22 - 32 mmol/L   Glucose, Bld 103 (H) 70 - 99 mg/dL    Comment: Glucose reference range applies only to samples taken after fasting for at least 8 hours.   BUN 18 6 - 20 mg/dL   Creatinine, Ser 0.86 0.61 - 1.24 mg/dL   Calcium 8.8 (L) 8.9 - 10.3 mg/dL   Total Protein  6.5 6.5 - 8.1 g/dL   Albumin 2.7 (L) 3.5 - 5.0 g/dL   AST 40 15 - 41 U/L   ALT 83 (H) 0 - 44 U/L   Alkaline Phosphatase 70 38 - 126 U/L   Total Bilirubin 0.4 0.3 - 1.2 mg/dL   GFR, Estimated >57 >84 mL/min    Comment: (NOTE) Calculated using the CKD-EPI Creatinine Equation (2021)    Anion gap 7 5 - 15    Comment: Performed at [facility], [address], [place], [place] [address]  CK     Status: Abnormal   Collection Time: 04/11/22  9:39 AM  Result Value Ref Range   Total CK 15 (L) 49 - 397 U/L    Comment: Performed at [facility], [address] [place], [place] [address]  CBC with Differential/Platelet     Status: Abnormal   Collection Time: 04/11/22  9:39 AM  Result Value Ref Range   WBC 8.2 4.0 - 10.5 K/uL   RBC 3.29 (L) 4.22 - 5.81 MIL/uL   Hemoglobin 9.1 (L) 13.0 - 17.0 g/dL   HCT 32.4 (L) 40.1 - 02.7 %   MCV 85.1 80.0 - 100.0 fL   MCH 27.7 26.0 - 34.0 pg   MCHC 32.5 30.0 - 36.0 g/dL   RDW 25.3 (H) 66.4 - 40.3 %   Platelets 100 (L) 150 - 400 K/uL   nRBC 0.0 0.0 - 0.2 %   Neutrophils Relative % 73 %   Neutro Abs 6.0 1.7 - 7.7 K/uL  Lymphocytes Relative 17 %   Lymphs Abs 1.4 0.7 - 4.0 K/uL   Monocytes Relative 9 %   Monocytes Absolute 0.7 0.1 - 1.0 K/uL   Eosinophils Relative 1 %   Eosinophils Absolute 0.1 0.0 - 0.5 K/uL   Basophils Relative 0 %   Basophils Absolute 0.0 0.0 - 0.1 K/uL   Immature Granulocytes 0 %   Abs Immature Granulocytes 0.02 0.00 - 0.07 K/uL    Comment: Performed at [facility], [address], [place], [place] [address]  Magnesium     Status: None   Collection Time: 04/11/22  9:39 AM  Result Value Ref Range   Magnesium 2.0 1.7 - 2.4 mg/dL    Comment: Performed at [facility], [address], [place], [place] [address]  Comprehensive metabolic panel     Status: Abnormal   Collection Time: 04/11/22  9:39 AM  Result Value Ref Range   Sodium 135 135 - 145 mmol/L   Potassium 3.7 3.5 - 5.1 mmol/L   Chloride 101 98  - 111 mmol/L   CO2 25 22 - 32 mmol/L   Glucose, Bld 137 (H) 70 - 99 mg/dL    Comment: Glucose reference range applies only to samples taken after fasting for at least 8 hours.   BUN 10 6 - 20 mg/dL   Creatinine, Ser 3.71 0.61 - 1.24 mg/dL   Calcium 9.0 8.9 - 06.2 mg/dL   Total Protein 7.2 6.5 - 8.1 g/dL   Albumin 3.0 (L) 3.5 - 5.0 g/dL   AST 28 15 - 41 U/L   ALT 68 (H) 0 - 44 U/L   Alkaline Phosphatase 70 38 - 126 U/L   Total Bilirubin 0.8 0.3 - 1.2 mg/dL   GFR, Estimated >69 >48 mL/min    Comment: (NOTE) Calculated using the CKD-EPI Creatinine Equation (2021)    Anion gap 9 5 - 15    Comment: Performed at [facility], [address], [place], [place] [address]  Culture, blood (Routine X 2) w Reflex to ID Panel     Status: None   Collection Time: 04/11/22 10:05 AM   Specimen: BLOOD RIGHT ARM  Result Value Ref Range   Specimen Description BLOOD RIGHT ARM    Special Requests      BOTTLES DRAWN AEROBIC AND ANAEROBIC Blood Culture results may not be optimal due to an inadequate volume of blood received in culture bottles   Culture      NO GROWTH 5 DAYS Performed at [facility], [address], [place], [place] [address]    Report Status 04/16/2022 FINAL   Culture, blood (Routine X 2) w Reflex to ID Panel     Status: None   Collection Time: 04/11/22 10:05 AM   Specimen: BLOOD LEFT ARM  Result Value Ref Range   Specimen Description BLOOD LEFT ARM    Special Requests      BOTTLES DRAWN AEROBIC AND ANAEROBIC Blood Culture results may not be optimal due to an inadequate volume of blood received in culture bottles   Culture      NO GROWTH 5 DAYS Performed at [facility], [address] [place], [place] [address]    Report Status 04/16/2022 FINAL   CBC with Differential/Platelet     Status: Abnormal   Collection Time: 04/12/22  3:04 AM  Result Value Ref Range   WBC 7.2 4.0 - 10.5 K/uL   RBC 3.05 (L) 4.22 - 5.81  MIL/uL   Hemoglobin 8.3 (L) 13.0 - 17.0 g/dL    HCT 40.9 (L) 81.1 - 52.0 %   MCV 83.9 80.0 - 100.0 fL   MCH 27.2 26.0 - 34.0 pg   MCHC 32.4 30.0 - 36.0 g/dL   RDW 91.4 (H) 78.2 - 95.6 %   Platelets 100 (L) 150 - 400 K/uL   nRBC 0.0 0.0 - 0.2 %   Neutrophils Relative % 66 %   Neutro Abs 4.7 1.7 - 7.7 K/uL   Lymphocytes Relative 21 %   Lymphs Abs 1.5 0.7 - 4.0 K/uL   Monocytes Relative 12 %   Monocytes Absolute 0.9 0.1 - 1.0 K/uL   Eosinophils Relative 1 %   Eosinophils Absolute 0.1 0.0 - 0.5 K/uL   Basophils Relative 0 %   Basophils Absolute 0.0 0.0 - 0.1 K/uL   Immature Granulocytes 0 %   Abs Immature Granulocytes 0.02 0.00 - 0.07 K/uL    Comment: Performed at [facility], [address], [place], [place] [address]  Magnesium     Status: None   Collection Time: 04/12/22  3:04 AM  Result Value Ref Range   Magnesium 1.9 1.7 - 2.4 mg/dL    Comment: Performed at [facility], [address], [place], [place] [address]  Comprehensive metabolic panel     Status: Abnormal   Collection Time: 04/12/22  3:04 AM  Result Value Ref Range   Sodium 134 (L) 135 - 145 mmol/L   Potassium 3.6 3.5 - 5.1 mmol/L   Chloride 99 98 - 111 mmol/L   CO2 25 22 - 32 mmol/L   Glucose, Bld 99 70 - 99 mg/dL    Comment: Glucose reference range applies only to samples taken after fasting for at least 8 hours.   BUN 15 6 - 20 mg/dL   Creatinine, Ser 6.96 0.61 - 1.24 mg/dL   Calcium 8.6 (L) 8.9 - 10.3 mg/dL   Total Protein 6.7 6.5 - 8.1 g/dL   Albumin 2.7 (L) 3.5 - 5.0 g/dL   AST 27 15 - 41 U/L   ALT 58 (H) 0 - 44 U/L   Alkaline Phosphatase 66 38 - 126 U/L   Total Bilirubin 0.5 0.3 - 1.2 mg/dL   GFR, Estimated >29 >52 mL/min    Comment: (NOTE) Calculated using the CKD-EPI Creatinine Equation (2021)    Anion gap 10 5 - 15    Comment: Performed at [facility], [address], [place], [place] [address]  CBC with Differential/Platelet     Status: Abnormal   Collection Time: 04/13/22  2:00 PM  Result Value Ref Range   WBC 6.1 4.0 -  10.5 K/uL   RBC 3.15 (L) 4.22 - 5.81 MIL/uL   Hemoglobin 8.5 (L) 13.0 - 17.0 g/dL   HCT 44.0 (L) 10.2 - 72.5 %   MCV 84.8 80.0 - 100.0 fL   MCH 27.0 26.0 - 34.0 pg   MCHC 31.8 30.0 - 36.0 g/dL   RDW 36.6 (H) 44.0 - 34.7 %   Platelets 113 (L) 150 - 400 K/uL    Comment: REPEATED TO VERIFY   nRBC 0.0 0.0 - 0.2 %   Neutrophils Relative % 66 %   Neutro Abs 4.0 1.7 - 7.7 K/uL   Lymphocytes Relative 23 %   Lymphs Abs 1.4 0.7 - 4.0 K/uL   Monocytes Relative 10 %   Monocytes Absolute 0.6 0.1 - 1.0 K/uL   Eosinophils Relative 1 %  Eosinophils Absolute 0.1 0.0 - 0.5 K/uL   Basophils Relative 0 %   Basophils Absolute 0.0 0.0 - 0.1 K/uL   Immature Granulocytes 0 %   Abs Immature Granulocytes 0.02 0.00 - 0.07 K/uL    Comment: Performed at [facility], [address], [place], [place] [address]  Magnesium     Status: None   Collection Time: 04/13/22  2:00 PM  Result Value Ref Range   Magnesium 2.0 1.7 - 2.4 mg/dL    Comment: Performed at [facility], [address], [place], [place] [address]  Comprehensive metabolic panel     Status: Abnormal   Collection Time: 04/13/22  2:00 PM  Result Value Ref Range   Sodium 133 (L) 135 - 145 mmol/L   Potassium 5.2 (H) 3.5 - 5.1 mmol/L    Comment: HEMOLYSIS AT THIS LEVEL MAY AFFECT RESULT   Chloride 99 98 - 111 mmol/L   CO2 22 22 - 32 mmol/L   Glucose, Bld 91 70 - 99 mg/dL    Comment: Glucose reference range applies only to samples taken after fasting for at least 8 hours.   BUN 13 6 - 20 mg/dL   Creatinine, Ser 2.84 0.61 - 1.24 mg/dL   Calcium 8.7 (L) 8.9 - 10.3 mg/dL   Total Protein 7.2 6.5 - 8.1 g/dL   Albumin 3.0 (L) 3.5 - 5.0 g/dL   AST 42 (H) 15 - 41 U/L    Comment: HEMOLYSIS AT THIS LEVEL MAY AFFECT RESULT   ALT 57 (H) 0 - 44 U/L    Comment: HEMOLYSIS AT THIS LEVEL MAY AFFECT RESULT   Alkaline Phosphatase 83 38 - 126 U/L   Total Bilirubin 0.6 0.3 - 1.2 mg/dL    Comment: HEMOLYSIS AT THIS LEVEL MAY AFFECT RESULT   GFR,  Estimated >60 >60 mL/min    Comment: (NOTE) Calculated using the CKD-EPI Creatinine Equation (2021)    Anion gap 12 5 - 15    Comment: Performed at [facility], [address] [place], [place] [address]  CBC with Differential/Platelet     Status: Abnormal   Collection Time: 04/14/22  2:24 AM  Result Value Ref Range   WBC 7.2 4.0 - 10.5 K/uL   RBC 3.38 (L) 4.22 - 5.81 MIL/uL   Hemoglobin 9.0 (L) 13.0 - 17.0 g/dL   HCT 01.0 (L) 27.2 - 53.6 %   MCV 84.0 80.0 - 100.0 fL   MCH 26.6 26.0 - 34.0 pg   MCHC 31.7 30.0 - 36.0 g/dL   RDW 64.4 (H) 03.4 - 74.2 %   Platelets 153 150 - 400 K/uL   nRBC 0.0 0.0 - 0.2 %   Neutrophils Relative % 60 %   Neutro Abs 4.3 1.7 - 7.7 K/uL   Lymphocytes Relative 28 %   Lymphs Abs 2.0 0.7 - 4.0 K/uL   Monocytes Relative 11 %   Monocytes Absolute 0.8 0.1 - 1.0 K/uL   Eosinophils Relative 1 %   Eosinophils Absolute 0.1 0.0 - 0.5 K/uL   Basophils Relative 0 %   Basophils Absolute 0.0 0.0 - 0.1 K/uL   Immature Granulocytes 0 %   Abs Immature Granulocytes 0.03 0.00 - 0.07 K/uL    Comment: Performed at [facility], [address] [place], [place] [address]  Magnesium     Status: None   Collection Time: 04/14/22  2:24 AM  Result Value Ref Range   Magnesium 2.0 1.7 - 2.4  mg/dL    Comment: Performed at [facility], [address], [place], [place] [address]  Comprehensive metabolic panel     Status: Abnormal   Collection Time: 04/14/22  2:24 AM  Result Value Ref Range   Sodium 136 135 - 145 mmol/L   Potassium 3.7 3.5 - 5.1 mmol/L   Chloride 102 98 - 111 mmol/L   CO2 24 22 - 32 mmol/L   Glucose, Bld 98 70 - 99 mg/dL    Comment: Glucose reference range applies only to samples taken after fasting for at least 8 hours.   BUN 13 6 - 20 mg/dL   Creatinine, Ser 6.04 0.61 - 1.24 mg/dL   Calcium 9.0 8.9 - 54.0 mg/dL   Total Protein 7.6 6.5 - 8.1 g/dL   Albumin 2.9 (L) 3.5 - 5.0 g/dL   AST 31 15 - 41 U/L   ALT 59 (H) 0 - 44 U/L   Alkaline  Phosphatase 73 38 - 126 U/L   Total Bilirubin 0.2 (L) 0.3 - 1.2 mg/dL   GFR, Estimated >98 >11 mL/min    Comment: (NOTE) Calculated using the CKD-EPI Creatinine Equation (2021)    Anion gap 10 5 - 15    Comment: Performed at [facility], [address], [place], [place] [address]  CBC with Differential/Platelet     Status: Abnormal   Collection Time: 04/15/22  7:05 AM  Result Value Ref Range   WBC 5.0 4.0 - 10.5 K/uL   RBC 3.52 (L) 4.22 - 5.81 MIL/uL   Hemoglobin 9.5 (L) 13.0 - 17.0 g/dL   HCT 29.5 (L) 62.1 - 30.8 %   MCV 84.4 80.0 - 100.0 fL   MCH 27.0 26.0 - 34.0 pg   MCHC 32.0 30.0 - 36.0 g/dL   RDW 65.7 (H) 84.6 - 96.2 %   Platelets 151 150 - 400 K/uL   nRBC 0.0 0.0 - 0.2 %   Neutrophils Relative % 51 %   Neutro Abs 2.5 1.7 - 7.7 K/uL   Lymphocytes Relative 33 %   Lymphs Abs 1.7 0.7 - 4.0 K/uL   Monocytes Relative 10 %   Monocytes Absolute 0.5 0.1 - 1.0 K/uL   Eosinophils Relative 4 %   Eosinophils Absolute 0.2 0.0 - 0.5 K/uL   Basophils Relative 1 %   Basophils Absolute 0.0 0.0 - 0.1 K/uL   Immature Granulocytes 1 %   Abs Immature Granulocytes 0.03 0.00 - 0.07 K/uL    Comment: Performed at [facility], [address], [place], [place] [address]  Magnesium     Status: None   Collection Time: 04/15/22  7:05 AM  Result Value Ref Range   Magnesium 2.3 1.7 - 2.4 mg/dL    Comment: Performed at [facility], [address] [place], [place] [address]  CBC with Differential/Platelet     Status: Abnormal   Collection Time: 04/16/22  7:12 AM  Result Value Ref Range   WBC 7.1 4.0 - 10.5 K/uL   RBC 3.09 (L) 4.22 - 5.81 MIL/uL   Hemoglobin 8.3 (L) 13.0 - 17.0 g/dL   HCT 01.0 (L) 27.2 - 53.6 %   MCV 83.8 80.0 - 100.0 fL   MCH 26.9 26.0 - 34.0 pg   MCHC 32.0 30.0 - 36.0 g/dL   RDW 64.4 (H) 03.4 - 74.2 %   Platelets 179 150 - 400 K/uL   nRBC 0.0 0.0 - 0.2 %   Neutrophils Relative %  64 %   Neutro Abs 4.5 1.7 - 7.7 K/uL   Lymphocytes Relative 25 %    Lymphs Abs 1.8 0.7 - 4.0 K/uL   Monocytes Relative 8 %   Monocytes Absolute 0.6 0.1 - 1.0 K/uL   Eosinophils Relative 3 %   Eosinophils Absolute 0.2 0.0 - 0.5 K/uL   Basophils Relative 0 %   Basophils Absolute 0.0 0.0 - 0.1 K/uL   Immature Granulocytes 0 %   Abs Immature Granulocytes 0.03 0.00 - 0.07 K/uL    Comment: Performed at [facility], [address], [place], [place] [address]  Magnesium     Status: None   Collection Time: 04/16/22  7:12 AM  Result Value Ref Range   Magnesium 2.1 1.7 - 2.4 mg/dL    Comment: Performed at [facility], [address] [place], [place] [address]  CBC     Status: Abnormal   Collection Time: 04/17/22  3:04 AM  Result Value Ref Range   WBC 6.0 4.0 - 10.5 K/uL   RBC 3.15 (L) 4.22 - 5.81 MIL/uL   Hemoglobin 8.5 (L) 13.0 - 17.0 g/dL   HCT 26.8 (L) 34.1 - 96.2 %   MCV 83.8 80.0 - 100.0 fL   MCH 27.0 26.0 - 34.0 pg   MCHC 32.2 30.0 - 36.0 g/dL   RDW 22.9 (H) 79.8 - 92.1 %   Platelets 178 150 - 400 K/uL   nRBC 0.0 0.0 - 0.2 %    Comment: Performed at [facility], [address], [place], [place] [address]  Basic metabolic panel     Status: Abnormal   Collection Time: 04/17/22  3:04 AM  Result Value Ref Range   Sodium 137 135 - 145 mmol/L   Potassium 4.2 3.5 - 5.1 mmol/L   Chloride 103 98 - 111 mmol/L   CO2 26 22 - 32 mmol/L   Glucose, Bld 95 70 - 99 mg/dL    Comment: Glucose reference range applies only to samples taken after fasting for at least 8 hours.   BUN 10 6 - 20 mg/dL   Creatinine, Ser 4.08 0.61 - 1.24 mg/dL   Calcium 8.7 (L) 8.9 - 10.3 mg/dL   GFR, Estimated >14 >48 mL/min    Comment: (NOTE) Calculated using the CKD-EPI Creatinine Equation (2021)    Anion gap 8 5 - 15    Comment: Performed at [facility], [address], [place], [place] [address]  CK     Status: Abnormal   Collection Time: 04/18/22  4:26 AM  Result Value Ref Range   Total CK 12 (L) 49 - 397 U/L    Comment: Performed at [facility], [address], [place], [place] [address]  CBC     Status: Abnormal   Collection Time: 04/22/22  4:07 AM  Result Value Ref Range   WBC 7.0 4.0 - 10.5 K/uL   RBC 4.05 (L) 4.22 - 5.81 MIL/uL   Hemoglobin 10.7 (L) 13.0 - 17.0 g/dL   HCT 26.3 (L) 78.5 - 88.5 %   MCV 83.7 80.0 - 100.0 fL   MCH 26.4 26.0 - 34.0 pg   MCHC 31.6 30.0 - 36.0 g/dL   RDW 02.7 (H) 74.1 - 28.7 %   Platelets 234 150 - 400 K/uL   nRBC 0.0 0.0 - 0.2 %    Comment: Performed at [facility], [address] [place], [place] [address]  Comprehensive  metabolic panel     Status: Abnormal   Collection Time: 04/22/22  4:07 AM  Result Value Ref Range   Sodium 139 135 - 145 mmol/L   Potassium 4.8 3.5 - 5.1 mmol/L   Chloride 100 98 - 111 mmol/L   CO2 29 22 - 32 mmol/L   Glucose, Bld 98 70 - 99 mg/dL    Comment: Glucose reference range applies only to samples taken after fasting for at least 8 hours.   BUN 19 6 - 20 mg/dL   Creatinine, Ser 4.27 0.61 - 1.24 mg/dL   Calcium 9.9 8.9 - 06.2 mg/dL   Total Protein 7.7 6.5 - 8.1 g/dL   Albumin 3.2 (L) 3.5 - 5.0 g/dL   AST 33 15 - 41 U/L   ALT 33 0 - 44 U/L   Alkaline Phosphatase 75 38 - 126 U/L   Total Bilirubin 0.5 0.3 - 1.2 mg/dL   GFR, Estimated >37 >62 mL/min    Comment: (NOTE) Calculated using the CKD-EPI Creatinine Equation (2021)    Anion gap 10 5 - 15    Comment: Performed at [facility], [address], [place], [place] [address]  Protime-INR     Status: None   Collection Time: 04/22/22  4:07 AM  Result Value Ref Range   Prothrombin Time 14.1 11.4 - 15.2 seconds   INR 1.1 0.8 - 1.2    Comment: (NOTE) INR goal varies based on device and disease states. Performed at [facility], [address] [place], [place] [address]   Type and screen [facility]     Status: None   Collection Time: 04/22/22  4:07 AM  Result Value Ref Range   ABO/RH(D) O POS    Antibody Screen NEG    Sample Expiration 04/25/2022,2359     Unit Number [id number]    Blood Component Type RED CELLS,LR    Unit division 00    Status of Unit ISSUED,FINAL    Transfusion Status OK TO TRANSFUSE    Crossmatch Result Compatible    Unit Number [id number]    Blood Component Type RED CELLS,LR    Unit division 00    Status of Unit ISSUED,FINAL    Transfusion Status OK TO TRANSFUSE    Crossmatch Result Compatible    Unit Number [id number]    Blood Component Type RED CELLS,LR    Unit division 00    Status of Unit ISSUED,FINAL    Transfusion Status OK TO TRANSFUSE    Crossmatch Result Compatible    Unit Number [id number]    Blood Component Type RED CELLS,LR    Unit division 00    Status of Unit ISSUED,FINAL    Transfusion Status OK TO TRANSFUSE    Crossmatch Result      Compatible Performed at [facility], [address], [place], [place] [address]    Unit Number [id number]    Blood Component Type RED CELLS,LR    Unit division 00    Status of Unit ISSUED,FINAL    Transfusion Status OK TO TRANSFUSE    Crossmatch Result Compatible   BPAM RBC     Status: None   Collection Time: 04/22/22  4:07 AM  Result Value Ref Range   ISSUE DATE / TIME 086761950932    Blood Product Unit Number [id number]    PRODUCT CODE [id number]    Unit Type and Rh 5100    Blood Product Expiration Date 673419379024    ISSUE  DATE / TIME 161096045409    Blood Product Unit Number [id number]    PRODUCT CODE [id number]    Unit Type and Rh 5100    Blood Product Expiration Date 130865784696    ISSUE DATE / TIME 295284132440    Blood Product Unit Number [id number]    PRODUCT CODE [id number]    Unit Type and Rh 5100    Blood Product Expiration Date 387564332951    ISSUE DATE / TIME 884166063016    Blood Product Unit Number [id number]    PRODUCT CODE [id number]    Unit Type and Rh 5100    Blood Product Expiration Date 237628315176    ISSUE DATE / TIME 160737106269    Blood Product Unit Number [id number]    PRODUCT CODE  [id number]    Unit Type and Rh 5100    Blood Product Expiration Date 716967893810   Prepare RBC (crossmatch)     Status: None   Collection Time: 04/22/22  4:30 PM  Result Value Ref Range   Order Confirmation      ORDER PROCESSED BY BLOOD BANK Performed at [facility], [address], [place], [place] [address]   CBC     Status: Abnormal   Collection Time: 04/23/22  5:49 AM  Result Value Ref Range   WBC 8.6 4.0 - 10.5 K/uL   RBC 3.98 (L) 4.22 - 5.81 MIL/uL   Hemoglobin 10.4 (L) 13.0 - 17.0 g/dL   HCT 25.8 (L) 52.7 - 78.2 %   MCV 83.9 80.0 - 100.0 fL   MCH 26.1 26.0 - 34.0 pg   MCHC 31.1 30.0 - 36.0 g/dL   RDW 42.3 (H) 53.6 - 14.4 %   Platelets 185 150 - 400 K/uL   nRBC 0.0 0.0 - 0.2 %    Comment: Performed at [facility], [address], [place], [place] [address]  Basic metabolic panel     Status: Abnormal   Collection Time: 04/23/22  5:49 AM  Result Value Ref Range   Sodium 134 (L) 135 - 145 mmol/L   Potassium 4.3 3.5 - 5.1 mmol/L   Chloride 98 98 - 111 mmol/L   CO2 26 22 - 32 mmol/L   Glucose, Bld 119 (H) 70 - 99 mg/dL    Comment: Glucose reference range applies only to samples taken after fasting for at least 8 hours.   BUN 19 6 - 20 mg/dL   Creatinine, Ser 0.86 0.61 - 1.24 mg/dL   Calcium 9.9 8.9 - 76.1 mg/dL   GFR, Estimated >95 >09 mL/min    Comment: (NOTE) Calculated using the CKD-EPI Creatinine Equation (2021)    Anion gap 10 5 - 15    Comment: Performed at [facility], [address], [place], [place] [address]  ECHO INTRAOPERATIVE TEE     Status: None   Collection Time: 04/23/22  7:12 AM  Result Value Ref Range   Weight 2,800 oz   Height 70 in   BP 98/60 mmHg  I-STAT, chem 8     Status: Abnormal   Collection Time: 04/23/22  8:08 AM  Result Value Ref Range   Sodium 137 135 - 145 mmol/L   Potassium 4.1 3.5 - 5.1 mmol/L   Chloride 99 98 - 111 mmol/L   BUN 18 6 - 20 mg/dL   Creatinine, Ser 2.45 (L) 0.61 - 1.24 mg/dL   Glucose, Bld 809  (H) 70 - 99 mg/dL    Comment: Glucose reference range applies  only to samples taken after fasting for at least 8 hours.   Calcium, Ion 1.28 1.15 - 1.40 mmol/L   TCO2 28 22 - 32 mmol/L   Hemoglobin 9.9 (L) 13.0 - 17.0 g/dL   HCT 16.1 (L) 09.6 - 04.5 %  I-STAT 7, (LYTES, BLD GAS, ICA, H+H)     Status: Abnormal   Collection Time: 04/23/22  8:11 AM  Result Value Ref Range   pH, Arterial 7.354 7.35 - 7.45   pCO2 arterial 50.0 (H) 32 - 48 mmHg   pO2, Arterial 398 (H) 83 - 108 mmHg   Bicarbonate 27.9 20.0 - 28.0 mmol/L   TCO2 29 22 - 32 mmol/L   O2 Saturation 100 %   Acid-Base Excess 2.0 0.0 - 2.0 mmol/L   Sodium 136 135 - 145 mmol/L   Potassium 4.1 3.5 - 5.1 mmol/L   Calcium, Ion 1.27 1.15 - 1.40 mmol/L   HCT 30.0 (L) 39.0 - 52.0 %   Hemoglobin 10.2 (L) 13.0 - 17.0 g/dL   Sample type ARTERIAL   I-STAT, chem 8     Status: Abnormal   Collection Time: 04/23/22  9:02 AM  Result Value Ref Range   Sodium 136 135 - 145 mmol/L   Potassium 4.2 3.5 - 5.1 mmol/L   Chloride 101 98 - 111 mmol/L   BUN 17 6 - 20 mg/dL   Creatinine, Ser 4.09 (L) 0.61 - 1.24 mg/dL   Glucose, Bld 811 (H) 70 - 99 mg/dL    Comment: Glucose reference range applies only to samples taken after fasting for at least 8 hours.   Calcium, Ion 1.25 1.15 - 1.40 mmol/L   TCO2 25 22 - 32 mmol/L   Hemoglobin 10.5 (L) 13.0 - 17.0 g/dL   HCT 91.4 (L) 78.2 - 95.6 %  I-STAT 7, (LYTES, BLD GAS, ICA, H+H)     Status: Abnormal   Collection Time: 04/23/22  9:43 AM  Result Value Ref Range   pH, Arterial 7.375 7.35 - 7.45   pCO2 arterial 46.2 32 - 48 mmHg   pO2, Arterial 360 (H) 83 - 108 mmHg   Bicarbonate 27.0 20.0 - 28.0 mmol/L   TCO2 28 22 - 32 mmol/L   O2 Saturation 100 %   Acid-Base Excess 2.0 0.0 - 2.0 mmol/L   Sodium 138 135 - 145 mmol/L   Potassium 4.2 3.5 - 5.1 mmol/L   Calcium, Ion 1.07 (L) 1.15 - 1.40 mmol/L   HCT 23.0 (L) 39.0 - 52.0 %   Hemoglobin 7.8 (L) 13.0 - 17.0 g/dL   Sample type ARTERIAL   POCT I-Stat EG7      Status: Abnormal   Collection Time: 04/23/22  9:52 AM  Result Value Ref Range   pH, Ven 7.333 7.25 - 7.43   pCO2, Ven 53.8 44 - 60 mmHg   pO2, Ven 43 32 - 45 mmHg   Bicarbonate 28.6 (H) 20.0 - 28.0 mmol/L   TCO2 30 22 - 32 mmol/L   O2 Saturation 74 %   Acid-Base Excess 3.0 (H) 0.0 - 2.0 mmol/L   Sodium 137 135 - 145 mmol/L   Potassium 5.1 3.5 - 5.1 mmol/L   Calcium, Ion 1.12 (L) 1.15 - 1.40 mmol/L   HCT 20.0 (L) 39.0 - 52.0 %   Hemoglobin 6.8 (LL) 13.0 - 17.0 g/dL   Sample type VENOUS   Surgical pathology     Status: None   Collection Time: 04/23/22 10:01 AM  Result Value Ref Range  SURGICAL PATHOLOGY      SURGICAL PATHOLOGY CASE: [id number] PATIENT: [place] Surgical Pathology Report     Clinical History: tricuspid regurgitation, endocarditis (cm)     FINAL MICROSCOPIC DIAGNOSIS:  A. VALVE VEGETATION, TRICUSPID: Laminated fibrin consistent with thrombus/vegetation showing acute inflammation and bacterial colonies   GROSS DESCRIPTION:  Received fresh is a 2.2 x 2 x 1.7 cm nodular portion of rubbery tan-white tissue.  Sections are submitted in 1 cassette.  [facility] 04/23/2022)   Final Diagnosis performed by Jerene Bears, MD.   Electronically signed 04/24/2022 Technical and / or Professional components performed at [facility]. [facility], [address] [place], [place] [address].  Immunohistochemistry Technical component (if applicable) was performed at [facility]. [address], [place], [place] [address].   IMMUNOHISTOCHEMISTRY DISCLAIMER (if applicable): Some of these immunohistochemical stains may have been d eveloped and the performance characteristics determine by [facility]. Some may not have been cleared or approved by the U.S. Food and Drug Administration. The FDA has determined that such clearance or approval is not necessary. This test is used for clinical purposes. It should not be regarded  as investigational or for research. This laboratory is certified under the Clinical Laboratory Improvement Amendments of 1988 (CLIA-88) as qualified to perform high complexity clinical laboratory testing.  The controls stained appropriately.   I-STAT, chem 8     Status: Abnormal   Collection Time: 04/23/22 10:15 AM  Result Value Ref Range   Sodium 134 (L) 135 - 145 mmol/L   Potassium 5.5 (H) 3.5 - 5.1 mmol/L   Chloride 101 98 - 111 mmol/L   BUN 19 6 - 20 mg/dL   Creatinine, Ser 9.56 0.61 - 1.24 mg/dL   Glucose, Bld 213 (H) 70 - 99 mg/dL    Comment: Glucose reference range applies only to samples taken after fasting for at least 8 hours.   Calcium, Ion 1.12 (L) 1.15 - 1.40 mmol/L   TCO2 28 22 - 32 mmol/L   Hemoglobin 6.8 (LL) 13.0 - 17.0 g/dL   HCT 08.6 (L) 57.8 - 46.9 %  Prepare RBC (crossmatch)     Status: None   Collection Time: 04/23/22 10:18 AM  Result Value Ref Range   Order Confirmation      ORDER PROCESSED BY BLOOD BANK Performed at [facility], [address] [place], [place] [address]   I-STAT 7, (LYTES, BLD GAS, ICA, H+H)     Status: Abnormal   Collection Time: 04/23/22 10:22 AM  Result Value Ref Range   pH, Arterial 7.400 7.35 - 7.45   pCO2 arterial 47.9 32 - 48 mmHg   pO2, Arterial 484 (H) 83 - 108 mmHg   Bicarbonate 29.6 (H) 20.0 - 28.0 mmol/L   TCO2 31 22 - 32 mmol/L   O2 Saturation 100 %   Acid-Base Excess 4.0 (H) 0.0 - 2.0 mmol/L   Sodium 136 135 - 145 mmol/L   Potassium 5.1 3.5 - 5.1 mmol/L   Calcium, Ion 1.12 (L) 1.15 - 1.40 mmol/L   HCT 20.0 (L) 39.0 - 52.0 %   Hemoglobin 6.8 (LL) 13.0 - 17.0 g/dL   Sample type ARTERIAL   Fungus Culture With Stain     Status: None   Collection Time: 04/23/22 10:36 AM   Specimen: Heart Valve Leaflet(s); Tissue  Result Value Ref Range   Fungus Stain Final report    Fungus (Mycology) Culture Final report     Comment: (NOTE)  Performed At: [facility] [address] [place], [place] [id number] Jolene Schimke MD [id number]    Fungal Source TISSUE     Comment: Performed at [facility], [address], [place], [place] [address]  Aerobic/Anaerobic Culture w Gram Stain (surgical/deep wound)     Status: None   Collection Time: 04/23/22 10:36 AM   Specimen: Heart Valve Leaflet(s); Tissue  Result Value Ref Range   Specimen Description TISSUE    Special Requests  VALVE LEAFLET    Gram Stain      RARE WBC PRESENT,BOTH PMN AND MONONUCLEAR NO ORGANISMS SEEN    Culture      ABUNDANT STAPHYLOCOCCUS AUREUS CRITICAL RESULT CALLED TO, READ BACK BY AND VERIFIED WITH: RN KERA.B AT 1301 ON 04/24/2022 BY T.SAAD. NO ANAEROBES ISOLATED SEE SEPARATE REPORT FOR ADDITIONAL SUSCEPTIBILITY RESULTS Performed at [facility], [address], [place], [place] [address]    Report Status 05/05/2022 FINAL   Acid Fast Culture with reflexed sensitivities     Status: None   Collection Time: 04/23/22 10:36 AM   Specimen: Heart Valve Leaflet(s); Tissue  Result Value Ref Range   Acid Fast Culture Negative     Comment: (NOTE) No acid fast bacilli isolated after 6 weeks. Performed At: [facility] [address] [place], [place] [id number] Jolene Schimke MD [id number]:[id number]    Source of Sample TISSUE     Comment: Performed at [facility], [address], [place], [place] [address]  Acid Fast Smear (AFB)     Status: None   Collection Time: 04/23/22 10:36 AM   Specimen: Heart Valve Leaflet(s); Tissue  Result Value Ref Range   AFB Specimen Processing Comment     Comment: Tissue Grinding and Digestion/Decontamination   Acid Fast Smear Negative     Comment: (NOTE) Performed At: [facility] [address] [place], [place] [id number] Jolene Schimke MD [id number]:[id number]    Source (AFB) TISSUE     Comment: Performed at [facility], [address] [place], [place] [address]  Susceptibility, Aer + Anaerob     Status: Abnormal   Collection Time: 04/23/22 10:36 AM  Result  Value Ref Range   Suscept, Aer + Anaerob Final report (A)     Comment: (NOTE) Performed At: [facility] [address] [place], [place] [id number] Jolene Schimke MD [id number] CORRECTED ON 09/28 AT 1517: PREVIOUSLY REPORTED AS Preliminary report    Source of Sample TISSUE HEART VALVE STAPH AUREUS     Comment: Performed at [facility], [address] [place], [place] [address]  Susceptibility, Aer + Anaerob     Status: Abnormal   Collection Time: 04/23/22 10:36 AM  Result Value Ref Range   Suscept, Aer + Anaerob Final report (A)     Comment: (NOTE) Performed At: [facility] [address] [place], [place] [id number] Jolene Schimke MD [id number] CORRECTED ON 09/28 AT 3818: PREVIOUSLY REPORTED AS Preliminary report    Source of Sample TISSUE HEART VALVE STAPH AUREUS ISOLATE 2     Comment: Performed at [facility], [address], [place], [place] [address]  Min Inhibitory Conc (2 Drugs)     Status: None   Collection Time: 04/23/22 10:36 AM  Result Value Ref Range   Min Inhibitory Conc (2 Drugs) Final report     Comment: (NOTE) Performed At: [facility] [address] [place], [place] [id number] Jolene Schimke MD [id number] CORRECTED ON 09/29 AT 0435:  PREVIOUSLY REPORTED AS Preliminary report    Source (MIC2)      TISSUE HEART VALVE STAPH AUREUS NO 1 DAPTOMYCIN AND CEFTAROLINE    Comment: Performed at [facility], [address] [place], [place] [address]  Min Inhibitory Conc (2 Drugs)     Status: None   Collection Time: 04/23/22 10:36 AM  Result Value Ref Range   Min Inhibitory Conc (2 Drugs) Final report     Comment: (NOTE) Performed At: [facility] [address] [place], [place] [id number] Jolene Schimke MD [id number]:[id number] CORRECTED ON 09/29 AT 0435: PREVIOUSLY REPORTED AS Preliminary report    Source (MIC2)      TISSUE HEART VALVE STAPH AUREUS NO 2 DAPTOMYCIN AND CEFTAROLINE    Comment: Performed at  [facility], [address], [place], [place] [address]  Susceptibility Result     Status: Abnormal   Collection Time: 04/23/22 10:36 AM  Result Value Ref Range   Suscept Result 1 Staphylococcus aureus (A)     Comment: (NOTE) Identification performed by account, not confirmed by this laboratory. Based on susceptibility to oxacillin this isolate would be susceptible to: *Penicillinase-stable penicillins, such as:  Cloxacillin, Dicloxacillin, Nafcillin *Beta-lactam combination agents, such as:  Amoxicillin-clavulanic acid, Ampicillin-sulbactam,  Piperacillin-tazobactam *Oral cephems, such as:  Cefaclor, Cefdinir, Cefpodoxime, Cefprozil, Cefuroxime,  Cephalexin, Loracarbef *Parenteral cephems, such as:  Cefazolin, Cefepime, Cefotaxime, Cefotetan, Ceftaroline,  Ceftizoxime, Ceftriaxone, Cefuroxime *Carbapenems, such as:  Doripenem, Ertapenem, Imipenem, Meropenem    Antimicrobial Suscept Comment     Comment: (NOTE)      ** S = Susceptible; I = Intermediate; R = Resistant **                   P = Positive; N = Negative            MICS are expressed in micrograms per mL   Antibiotic                 RSLT#1    RSLT#2    RSLT#3    RSLT#4 Ciprofloxacin                  S Clindamycin                    S Erythromycin                   R Gentamicin                     S Levofloxacin                   S Linezolid                      S Oxacillin                      S Penicillin                     R Rifampin                       S Tetracycline                   S Trimethoprim/Sulfa             S Vancomycin  S Performed At: [facility] [address] [place], [place] [id number] Jolene Schimke MD WU:[id number]   Susceptibility Result     Status: Abnormal   Collection Time: 04/23/22 10:36 AM  Result Value Ref Range   Suscept Result 1 Staphylococcus aureus (A)     Comment: (NOTE) Identification performed by account, not confirmed by  this laboratory. Based on susceptibility to oxacillin this isolate would be susceptible to: *Penicillinase-stable penicillins, such as:  Cloxacillin, Dicloxacillin, Nafcillin *Beta-lactam combination agents, such as:  Amoxicillin-clavulanic acid, Ampicillin-sulbactam,  Piperacillin-tazobactam *Oral cephems, such as:  Cefaclor, Cefdinir, Cefpodoxime, Cefprozil, Cefuroxime,  Cephalexin, Loracarbef *Parenteral cephems, such as:  Cefazolin, Cefepime, Cefotaxime, Cefotetan, Ceftaroline,  Ceftizoxime, Ceftriaxone, Cefuroxime *Carbapenems, such as:  Doripenem, Ertapenem, Imipenem, Meropenem    Antimicrobial Suscept Comment     Comment: (NOTE)      ** S = Susceptible; I = Intermediate; R = Resistant **                   P = Positive; N = Negative            MICS are expressed in micrograms per mL   Antibiotic                 RSLT#1    RSLT#2    RSLT#3    RSLT#4 Ciprofloxacin                  S Clindamycin                    S Erythromycin                   R Gentamicin                     S Levofloxacin                   S Linezolid                      S Oxacillin                      S Penicillin                     R Rifampin                       S Tetracycline                   S Trimethoprim/Sulfa             S Vancomycin                     S Performed At: [facility] [address] [place], [place] [id number] Jolene Schimke MD VH:[id number]   MIC Results (2 Drugs)     Status: None   Collection Time: 04/23/22 10:36 AM  Result Value Ref Range   RESULT 5 MIC (RESULT 1)                Base Name: Staphylococcus aureus     Comment: (NOTE) Identification performed by account, not confirmed by this laboratory. CEFTAROLINE    <=1 UG/ML = SUSCEPTIBLE DAPTOMYCIN      >1 UG/ML = NON-SUSCEPTIBLE Performed At: [facility] [address] [place], [place] [id number] Jolene Schimke MD [id number]   MIC Results (2 Drugs)  Status: None   Collection Time:  04/23/22 10:36 AM  Result Value Ref Range   RESULT 5 MIC (RESULT 1)                Base Name: Staphylococcus aureus     Comment: (NOTE) Identification performed by account, not confirmed by this laboratory. CEFATROLINE    <=1 UG/ML = SUSCEPTIBLE DAPTOMYCIN      >1 UG/ML = NON-SUSCEPTIBLE Performed At: [facility] [address] [place], [place] [id number] Jolene Schimke MD [id number]   Fungus Culture Result     Status: None   Collection Time: 04/23/22 10:36 AM  Result Value Ref Range   Result 1 Comment     Comment: (NOTE) KOH/Calcofluor preparation:  no fungus observed. Performed At: [facility] [address] [place], [place] [id number] Jolene Schimke MD [id number]:[id number]   Fungal organism reflex     Status: None   Collection Time: 04/23/22 10:36 AM  Result Value Ref Range   Fungal result 1 Comment     Comment: (NOTE) No yeast or mold isolated after 4 weeks. Performed At: [facility] [address] [place], [place] [id number] Jolene Schimke MD [id number]   Dickie La 8     Status: Abnormal   Collection Time: 04/23/22 10:44 AM  Result Value Ref Range   Sodium 135 135 - 145 mmol/L   Potassium 5.4 (H) 3.5 - 5.1 mmol/L   Chloride 102 98 - 111 mmol/L   BUN 18 6 - 20 mg/dL   Creatinine, Ser 9.32 0.61 - 1.24 mg/dL   Glucose, Bld 355 (H) 70 - 99 mg/dL    Comment: Glucose reference range applies only to samples taken after fasting for at least 8 hours.   Calcium, Ion 0.94 (L) 1.15 - 1.40 mmol/L   TCO2 26 22 - 32 mmol/L   Hemoglobin 8.5 (L) 13.0 - 17.0 g/dL   HCT 73.2 (L) 20.2 - 54.2 %  I-STAT 7, (LYTES, BLD GAS, ICA, H+H)     Status: Abnormal   Collection Time: 04/23/22 10:47 AM  Result Value Ref Range   pH, Arterial 7.340 (L) 7.35 - 7.45   pCO2 arterial 46.0 32 - 48 mmHg   pO2, Arterial 407 (H) 83 - 108 mmHg   Bicarbonate 24.8 20.0 - 28.0 mmol/L   TCO2 26 22 - 32 mmol/L   O2 Saturation 100 %   Acid-base deficit 1.0 0.0 - 2.0 mmol/L    Sodium 135 135 - 145 mmol/L   Potassium 5.4 (H) 3.5 - 5.1 mmol/L   Calcium, Ion 0.94 (L) 1.15 - 1.40 mmol/L   HCT 25.0 (L) 39.0 - 52.0 %   Hemoglobin 8.5 (L) 13.0 - 17.0 g/dL   Sample type ARTERIAL   Prepare RBC (crossmatch)     Status: None   Collection Time: 04/23/22 11:00 AM  Result Value Ref Range   Order Confirmation      BB SAMPLE OR UNITS ALREADY AVAILABLE Performed at [facility], [address] [place], [place] [address]   Hemoglobin and hematocrit, blood     Status: Abnormal   Collection Time: 04/23/22 11:11 AM  Result Value Ref Range   Hemoglobin 7.4 (L) 13.0 - 17.0 g/dL    Comment: REPEATED TO VERIFY READ BACK AND VERIFIED WITH [name] PAIGE RN AT 1127 [id number] BY ZBEECH    HCT 22.6 (L) 39.0 - 52.0 %    Comment: Performed at [facility], [address], [place], [place] [address]  Platelet count     Status: Abnormal   Collection Time: 04/23/22 11:11 AM  Result Value Ref Range   Platelets 105 (L) 150 - 400 K/uL    Comment: Performed at [facility], [address], [place], [place] [address]  I-STAT, [place]wyn [place] 8     Status: Abnormal   Collection Time: 04/23/22 11:12 AM  Result Value Ref Range   Sodium 137 135 - 145 mmol/L   Potassium 5.2 (H) 3.5 - 5.1 mmol/L   Chloride 102 98 - 111 mmol/L   BUN 18 6 - 20 mg/dL   Creatinine, Ser 6.04 (L) 0.61 - 1.24 mg/dL   Glucose, Bld 540 (H) 70 - 99 mg/dL    Comment: Glucose reference range applies only to samples taken after fasting for at least 8 hours.   Calcium, Ion 1.07 (L) 1.15 - 1.40 mmol/L   TCO2 26 22 - 32 mmol/L   Hemoglobin 7.8 (L) 13.0 - 17.0 g/dL   HCT 98.1 (L) 19.1 - 47.8 %  I-STAT 7, (LYTES, BLD GAS, ICA, H+H)     Status: Abnormal   Collection Time: 04/23/22 11:16 AM  Result Value Ref Range   pH, Arterial 7.365 7.35 - 7.45   pCO2 arterial 44.0 32 - 48 mmHg   pO2, Arterial 394 (H) 83 - 108 mmHg   Bicarbonate 25.1 20.0 - 28.0 mmol/L   TCO2 26 22 - 32 mmol/L   O2 Saturation 100 %   Acid-Base  Excess 0.0 0.0 - 2.0 mmol/L   Sodium 136 135 - 145 mmol/L   Potassium 5.2 (H) 3.5 - 5.1 mmol/L   Calcium, Ion 1.06 (L) 1.15 - 1.40 mmol/L   HCT 24.0 (L) 39.0 - 52.0 %   Hemoglobin 8.2 (L) 13.0 - 17.0 g/dL   Sample type ARTERIAL   I-STAT, chem 8     Status: Abnormal   Collection Time: 04/23/22 12:00 PM  Result Value Ref Range   Sodium 138 135 - 145 mmol/L   Potassium 4.9 3.5 - 5.1 mmol/L   Chloride 105 98 - 111 mmol/L   BUN 16 6 - 20 mg/dL   Creatinine, Ser 2.95 (L) 0.61 - 1.24 mg/dL   Glucose, Bld 621 (H) 70 - 99 mg/dL    Comment: Glucose reference range applies only to samples taken after fasting for at least 8 hours.   Calcium, Ion 1.29 1.15 - 1.40 mmol/L   TCO2 25 22 - 32 mmol/L   Hemoglobin 7.8 (L) 13.0 - 17.0 g/dL   HCT 30.8 (L) 65.7 - 84.6 %  I-STAT 7, (LYTES, BLD GAS, ICA, H+H)     Status: Abnormal   Collection Time: 04/23/22 12:03 PM  Result Value Ref Range   pH, Arterial 7.284 (L) 7.35 - 7.45   pCO2 arterial 53.6 (H) 32 - 48 mmHg   pO2, Arterial 484 (H) 83 - 108 mmHg   Bicarbonate 25.4 20.0 - 28.0 mmol/L   TCO2 27 22 - 32 mmol/L   O2 Saturation 100 %   Acid-base deficit 1.0 0.0 - 2.0 mmol/L   Sodium 137 135 - 145 mmol/L   Potassium 4.9 3.5 - 5.1 mmol/L   Calcium, Ion 1.28 1.15 - 1.40 mmol/L   HCT 24.0 (L) 39.0 - 52.0 %   Hemoglobin 8.2 (L) 13.0 - 17.0 g/dL   Sample type ARTERIAL   Glucose, capillary     Status: Abnormal   Collection Time: 04/23/22  1:21 PM  Result Value Ref Range   Glucose-Capillary  134 (H) 70 - 99 mg/dL    Comment: Glucose reference range applies only to samples taken after fasting for at least 8 hours.  I-STAT 7, (LYTES, BLD GAS, ICA, H+H)     Status: Abnormal   Collection Time: 04/23/22  1:23 PM  Result Value Ref Range   pH, Arterial 7.207 (L) 7.35 - 7.45   pCO2 arterial 53.8 (H) 32 - 48 mmHg   pO2, Arterial 130 (H) 83 - 108 mmHg   Bicarbonate 21.6 20.0 - 28.0 mmol/L   TCO2 23 22 - 32 mmol/L   O2 Saturation 98 %   Acid-base deficit 6.0  (H) 0.0 - 2.0 mmol/L   Sodium 140 135 - 145 mmol/L   Potassium 4.6 3.5 - 5.1 mmol/L   Calcium, Ion 1.27 1.15 - 1.40 mmol/L   HCT 27.0 (L) 39.0 - 52.0 %   Hemoglobin 9.2 (L) 13.0 - 17.0 g/dL   Patient temperature 16.1 C    Sample type ARTERIAL   CBC     Status: Abnormal   Collection Time: 04/23/22  1:27 PM  Result Value Ref Range   WBC 38.0 (H) 4.0 - 10.5 K/uL   RBC 3.10 (L) 4.22 - 5.81 MIL/uL   Hemoglobin 8.6 (L) 13.0 - 17.0 g/dL   HCT 09.6 (L) 04.5 - 40.9 %   MCV 85.5 80.0 - 100.0 fL   MCH 27.7 26.0 - 34.0 pg   MCHC 32.5 30.0 - 36.0 g/dL   RDW 81.1 (H) 91.4 - 78.2 %   Platelets 179 150 - 400 K/uL   nRBC 0.0 0.0 - 0.2 %    Comment: Performed at [facility], [address] [place], [place] [address]  Protime-INR     Status: Abnormal   Collection Time: 04/23/22  1:27 PM  Result Value Ref Range   Prothrombin Time 18.8 (H) 11.4 - 15.2 seconds   INR 1.6 (H) 0.8 - 1.2    Comment: (NOTE) INR goal varies based on device and disease states. Performed at [facility], [address], [place], [place] [address]   APTT     Status: Abnormal   Collection Time: 04/23/22  1:27 PM  Result Value Ref Range   aPTT 43 (H) 24 - 36 seconds    Comment:        IF BASELINE aPTT IS ELEVATED, SUGGEST PATIENT RISK ASSESSMENT BE USED TO DETERMINE APPROPRIATE ANTICOAGULANT THERAPY. Performed at [facility], [address], [place], [place] [address]   Basic metabolic panel     Status: Abnormal   Collection Time: 04/23/22  1:27 PM  Result Value Ref Range   Sodium 138 135 - 145 mmol/L   Potassium 5.0 3.5 - 5.1 mmol/L   Chloride 106 98 - 111 mmol/L   CO2 24 22 - 32 mmol/L   Glucose, Bld 156 (H) 70 - 99 mg/dL    Comment: Glucose reference range applies only to samples taken after fasting for at least 8 hours.   BUN 17 6 - 20 mg/dL   Creatinine, Ser 6.29 0.61 - 1.24 mg/dL   Calcium 8.6 (L) 8.9 - 10.3 mg/dL   GFR, Estimated >52 >84 mL/min    Comment: (NOTE) Calculated using  the CKD-EPI Creatinine Equation (2021)    Anion gap 8 5 - 15    Comment: Performed at [facility], [address], [place], [place] [address]  Fibrinogen     Status: None   Collection Time: 04/23/22  1:33 PM  Result Value Ref Range   Fibrinogen 282 210 - 475 mg/dL    Comment: (NOTE) Fibrinogen results may be underestimated in patients receiving thrombolytic therapy. Performed at [facility], [address], [place], [place] [address]   Glucose, capillary     Status: Abnormal   Collection Time: 04/23/22  2:35 PM  Result Value Ref Range   Glucose-Capillary 143 (H) 70 - 99 mg/dL    Comment: Glucose reference range applies only to samples taken after fasting for at least 8 hours.  I-STAT 7, (LYTES, BLD GAS, ICA, H+H)     Status: Abnormal   Collection Time: 04/23/22  2:42 PM  Result Value Ref Range   pH, Arterial 7.286 (L) 7.35 - 7.45   pCO2 arterial 45.4 32 - 48 mmHg   pO2, Arterial 220 (H) 83 - 108 mmHg   Bicarbonate 21.7 20.0 - 28.0 mmol/L   TCO2 23 22 - 32 mmol/L   O2 Saturation 100 %   Acid-base deficit 5.0 (H) 0.0 - 2.0 mmol/L   Sodium 140 135 - 145 mmol/L   Potassium 5.0 3.5 - 5.1 mmol/L   Calcium, Ion 1.17 1.15 - 1.40 mmol/L   HCT 18.0 (L) 39.0 - 52.0 %   Hemoglobin 6.1 (LL) 13.0 - 17.0 g/dL   Patient temperature 52.8 C    Sample type ARTERIAL    Comment NOTIFIED PHYSICIAN    *Note: Due to a large number of results and/or encounters for the requested time period, some results have not been displayed. A complete set of results can be found in Results Review.

## 2022-06-20 NOTE — Assessment & Plan Note (Signed)
He is already getting therapy the methadone clinic and I also showed him how to use Po on his phone to get more therapy.  I also offered to refer him for additional therapy in person with our therapist here as well as I offered psychiatry if he thinks he needs that.  Was not interested in for today but I let him know that I would make those referrals if he just calls back or contact me on MyChart with no appointment required.  Encouraged him to stay involved in therapy and support groups on a constant basis so that he can stay true to his goal of not dying and being there for the people he lives

## 2022-06-20 NOTE — Assessment & Plan Note (Signed)
Strongly encouraged him to not suddenly stop methadone again but if he wants to he is clearly very motivated to stay sober okay with him talking with his methadone provider about appropriateness to reduce the dose wants to be without it but I did generally discourage total stopping until he has been a very very very slow taper  He is grateful for his kids his car which he likes to work on his mom's dad's environment and I encouraged him to every day before he goes to bed take a moment to think about how grateful he is for each of these and other things in his life.

## 2022-06-20 NOTE — Assessment & Plan Note (Signed)
I discussed with Reginald Clarke the importance of tapering off Protonix if he is still on it but he does not think he is taking it anymore and is not having any heartburn.  I recommend that if he does get heartburn just taking Pepcid Complete now

## 2022-06-20 NOTE — Patient Instructions (Signed)
Description   Start taking warfarin 4mg  daily EXCEPT 6mg  on Mondays and Wednesdays. Recheck INR sin 1 week. Stay consistent with greens each week. Anticoagulation Clinic [phone] *PER SURGEON NOTE STOP WARFARIN ON 07/23/2022*

## 2022-06-20 NOTE — Patient Instructions (Addendum)
It was a pleasure seeing you today! I truly hope you feel like you received 5 star service and please let me know if there is anything I can improve.  Lula Olszewski, MD   Today the plan is... get xray  Please go to our  [facility] office to get your xrays done. You can walk in M-F between 8:30am- noon or 1pm - 5pm. Tell them you are there for xrays ordered by me. They will send me the results, then I will let you know the results with instructions.   Address: [address] [facility]  The Xray department is located in the basement.    MRSA bacteremia  Gastroesophageal reflux disease, unspecified whether esophagitis present Assessment & Plan: I discussed with Mr. Schaner the importance of tapering off Protonix if he is still on it but he does not think he is taking it anymore and is not having any heartburn.  I recommend that if he does get heartburn just taking Pepcid Complete now   History of smoking  Chronic pain syndrome/methadone dependency Assessment & Plan: Strongly encouraged him to not suddenly stop methadone again but if he wants to he is clearly very motivated to stay sober okay with him talking with his methadone provider about appropriateness to reduce the dose wants to be without it but I did generally discourage total stopping until he has been a very very very slow taper  He is grateful for his kids his car which he likes to work on his mom's dad's environment and I encouraged him to every day before he goes to bed take a moment to think about how grateful he is for each of these and other things in his life.   Tricuspid valve disorder  Substance use disorder Assessment & Plan: He is already getting therapy the methadone clinic and I also showed him how to use Po on his phone to get more therapy.  I also offered to refer him for additional therapy in person with our therapist here as well as I offered psychiatry if he thinks he needs that.  Was not interested in  for today but I let him know that I would make those referrals if he just calls back or contact me on MyChart with no appointment required.  Encouraged him to stay involved in therapy and support groups on a constant basis so that he can stay true to his goal of not dying and being there for the people he lives          [x]  RETURN TO CLINIC: No follow-ups on file.   - If you are not doing well: RETURN to the office sooner. - Please bring all your medicines to each appointment.  - If your condition begins to worsen or become severe:  GO to the ER.  [x]  QUESTIONS/CONCERNS:  If you have follow-up questions / concerns:  - CLINICAL: please contact me via phone [phone] OR MyChart messaging  - LAB & IMAGING RESULTS you will be contacted with the lab results as soon as they are available. For any labs or imaging tests, we will call you if the results are significantly abnormal.  Most normal results will be posted to myChart as soon as they are available and I will comment on them there within 2-3 business days.  The fastest way to get your results is to activate your My Chart account. Instructions are located on the last page of this paperwork. If you have not  heard from [place] regarding the results in 2 weeks, please contact this office.  - BILLING: xray and lab orders are billed from separate companies and questions./concerns should be directed to the invoicing company.  For visit charges please discuss with our administrative services

## 2022-06-20 NOTE — Assessment & Plan Note (Signed)
Declines qt recheck 06/20/22

## 2022-06-20 NOTE — Assessment & Plan Note (Signed)
Xray he had not cleared up when last checked in September 2023 although he does not have symptoms anymore I recommend that he get a repeat x-ray to make sure that the severe pneumonia has resolved

## 2022-06-26 ENCOUNTER — Ambulatory Visit: Payer: BLUE CROSS/BLUE SHIELD

## 2022-07-01 ENCOUNTER — Ambulatory Visit: Payer: BLUE CROSS/BLUE SHIELD | Admitting: Internal Medicine

## 2022-07-01 NOTE — Progress Notes (Deleted)
[facility]  CHIEF COMPLAINT:    Follow up for hepatitis C  SUBJECTIVE:    Reginald Clarke is a 31 y.o. male with PMHx as below who presents to the clinic for hepatitis C.   Reginald has a complicated history of polysubstance use complicated by hepatitis C and MSSA/MRSA TV endocarditis.  He is status post TV replacement in September at [facility] and subsequently completed a 6 week course of daptomycin and ceftaroline on 06/04/22.  He remains on oral doxycycline 100mg  PO BID x 1 month from completion of IV therapy.  He is here today to also discuss hepatitis C treatment.  In April 2023 he was found to have chronic hepatitis C, genotype 2B.  Treatment was deferred to the outpatient setting but unfortunately he remained hospitalized for several months until just recently at the end of October.  He has now established with a new PCP and is in treatment with methadone clinic.    Please see A&P for the details of today's visit and status of the patient's medical problems.   Patient's Medications  New Prescriptions   No medications on file  Previous Medications   ACETAMINOPHEN (TYLENOL) 650 MG CR TABLET    Take 650 mg by mouth every 8 (eight) hours as needed for pain.   ASCORBIC ACID (VITAMIN C) 500 MG TABLET    Take 1 tablet (500 mg total) by mouth 2 (two) times daily with a meal.   CYANOCOBALAMIN 1000 MCG TABLET    Take 1 tablet (1,000 mcg total) by mouth 2 (two) times daily with a meal.   DOXYCYCLINE (VIBRA-TABS) 100 MG TABLET    Take 1 tablet (100 mg total) by mouth 2 (two) times daily. Take with meals.   ESCITALOPRAM (LEXAPRO) 20 MG TABLET    Take 1 tablet (20 mg total) by mouth daily.   FERROUS FUMARATE (HEMOCYTE - 106 MG FE) 324 (106 FE) MG TABS TABLET    Take 1 tablet (106 mg of iron total) by mouth daily.   FOLIC ACID (FOLVITE) 1 MG TABLET    Take 1 tablet (1 mg total) by mouth 2 (two) times daily with a meal.   GABAPENTIN (NEURONTIN) 300 MG CAPSULE     Take 1 capsule (300 mg total) by mouth 3 (three) times daily.   METHOCARBAMOL (ROBAXIN) 500 MG TABLET    Take 1 tablet (500 mg total) by mouth every 8 (eight) hours as needed for muscle spasms.   ONDANSETRON (ZOFRAN) 4 MG TABLET    Take 1 tablet (4 mg total) by mouth every 8 (eight) hours as needed.   WARFARIN (COUMADIN) 4 MG TABLET    Once per day on Sun, Mon, Thu, Fri, Sat   WARFARIN (COUMADIN) 6 MG TABLET    Once per day on Tue and Wed  Modified Medications   No medications on file  Discontinued Medications   No medications on file      Past Medical History:  Diagnosis Date   Anxiety    Endocarditis    Hepatitis    hx of hep C per pt   Opiate dependence ([name])    on methadone   Smoking 12/25/2021    Social History   Tobacco Use   Smoking status: Former    Packs/day: 1.00    Years: 19.00    Total pack years: 19.00    Types: Cigarettes    Quit date: 12/20/2021    Years since  quitting: 0.5   Smokeless tobacco: Never   Tobacco comments:    Currently 5 cigs/day  Vaping Use   Vaping Use: Never used  Substance Use Topics   Alcohol use: No   Drug use: Yes    Types: Methamphetamines, Marijuana, Heroin    Comment: last use in April - meth; no heroin in 3 years; occasional marijuana    No family history on file.  No Known Allergies  ROS   OBJECTIVE:    There were no vitals filed for this visit. There is no height or weight on file to calculate BMI.  Physical Exam   Labs and Microbiology:    Latest Ref Rng & Units 06/03/2022    2:33 AM 05/27/2022    4:52 AM 05/20/2022    3:42 AM  CBC  WBC 4.0 - 10.5 K/uL 4.9  4.7  4.5   Hemoglobin 13.0 - 17.0 g/dL 10.6  10.2  9.5   Hematocrit 39.0 - 52.0 % 34.5  32.5  30.2   Platelets 150 - 400 K/uL 169  176  237       Latest Ref Rng & Units 06/03/2022    2:33 AM 05/27/2022    4:52 AM 05/20/2022    3:42 AM  CMP  Glucose 70 - 99 mg/dL 102  90  90   BUN 6 - 20 mg/dL 19  16  18    Creatinine 0.61 - 1.24 mg/dL 0.76  0.79   1.10   Sodium 135 - 145 mmol/L 138  140  139   Potassium 3.5 - 5.1 mmol/L 4.0  4.2  4.0   Chloride 98 - 111 mmol/L 101  102  102   CO2 22 - 32 mmol/L 27  29  28    Calcium 8.9 - 10.3 mg/dL 9.5  9.5  9.5   Total Protein 6.5 - 8.1 g/dL 6.7  6.8  6.6   Total Bilirubin 0.3 - 1.2 mg/dL 0.4  0.5  0.2   Alkaline Phos 38 - 126 U/L 81  84  78   AST 15 - 41 U/L 27  30  24    ALT 0 - 44 U/L 26  29  22       No results found for this or any previous visit (from the past 240 hour(s)).  Imaging: ***   ASSESSMENT & PLAN:    No problem-specific Assessment & Plan notes found for this encounter.   No orders of the defined types were placed in this encounter.    There are no diagnoses linked to this encounter.  He completed 6 week course of IV daptomycin and ceftaroline following valve replacement.  Currently on a month of oral doxycyline as tail coverage following initial IV therapy.  He will complete this in a couple days.  He has chronic hepatitis C, genotype 2B.  His labs to determine best treatment course were done over 6 months ago so we will repeat those again today to determine best anti-virals to treat with.  Discussed treatment plan with him and will follow up in 2 weeks to get started on treatment.    [facility] 07/01/2022, 12:12 PM

## 2022-07-02 ENCOUNTER — Ambulatory Visit: Payer: BLUE CROSS/BLUE SHIELD | Attending: Cardiology | Admitting: *Deleted

## 2022-07-02 DIAGNOSIS — Z952 Presence of prosthetic heart valve: Secondary | ICD-10-CM | POA: Diagnosis not present

## 2022-07-02 DIAGNOSIS — Z5181 Encounter for therapeutic drug level monitoring: Secondary | ICD-10-CM | POA: Diagnosis not present

## 2022-07-02 DIAGNOSIS — I079 Rheumatic tricuspid valve disease, unspecified: Secondary | ICD-10-CM | POA: Diagnosis not present

## 2022-07-02 LAB — POCT INR: INR: 1.4 — AB (ref 2.0–3.0)

## 2022-07-02 MED ORDER — WARFARIN SODIUM 6 MG PO TABS: ORAL_TABLET | ORAL | 0 refills | Status: DC

## 2022-07-02 MED ORDER — WARFARIN SODIUM 4 MG PO TABS: ORAL_TABLET | ORAL | 0 refills | Status: DC

## 2022-07-02 NOTE — Patient Instructions (Signed)
Description   Today take 6mg  and tomorrow take 8mg  then start taking warfarin 4mg  daily EXCEPT 6mg  on Mondays, Wednesdays, and Fridays. Recheck INR in 1 week. Stay consistent with greens each week. Anticoagulation Clinic [phone] *PER SURGEON NOTE STOP WARFARIN ON 07/23/2022*

## 2022-07-03 ENCOUNTER — Encounter: Payer: Self-pay | Admitting: Internal Medicine

## 2022-07-03 ENCOUNTER — Ambulatory Visit: Payer: BLUE CROSS/BLUE SHIELD | Attending: Internal Medicine | Admitting: Internal Medicine

## 2022-07-03 VITALS — BP 108/58 | HR 86 | Ht 70.0 in | Wt 206.0 lb

## 2022-07-03 DIAGNOSIS — Z952 Presence of prosthetic heart valve: Secondary | ICD-10-CM | POA: Diagnosis not present

## 2022-07-03 DIAGNOSIS — F1191 Opioid use, unspecified, in remission: Secondary | ICD-10-CM | POA: Diagnosis not present

## 2022-07-03 DIAGNOSIS — I079 Rheumatic tricuspid valve disease, unspecified: Secondary | ICD-10-CM

## 2022-07-03 NOTE — Patient Instructions (Signed)
Medication Instructions:  Your physician recommends that you continue on your current medications as directed. Please refer to the Current Medication list given to you today.  *If you need a refill on your cardiac medications before your next appointment, please call your pharmacy*  Lab Work: NONE If you have labs (blood work) drawn today and your tests are completely normal, you will receive your results only by: MyChart Message (if you have MyChart) OR A paper copy in the mail If you have any lab test that is abnormal or we need to change your treatment, we will call you to review the results.   Testing/Procedures: Referral placed to [facility]  ECHO Your physician has requested that you have an echocardiogram. Echocardiography is a painless test that uses sound waves to create images of your heart. It provides your doctor with information about the size and shape of your heart and how well your heart's chambers and valves are working. This procedure takes approximately one hour. There are no restrictions for this procedure. Please do NOT wear cologne, perfume, aftershave, or lotions (deodorant is allowed). Please arrive 15 minutes prior to your appointment time.   Follow-Up: At [facility], you and your health needs are our priority.  As part of our continuing mission to provide you with exceptional heart care, we have created designated Provider Care Teams.  These Care Teams include your primary Cardiologist (physician) and Advanced Practice Providers (APPs -  Physician Assistants and Nurse Practitioners) who all work together to provide you with the care you need, when you need it.  We recommend signing up for the patient portal called "MyChart".  Sign up information is provided on this After Visit Summary.  MyChart is used to connect with patients for Virtual Visits (Telemedicine).  Patients are able to view lab/test results, encounter notes, upcoming appointments, etc.  Non-urgent  messages can be sent to your provider as well.   To learn more about what you can do with MyChart, go to [email or web address].    Your next appointment:   1 year(s)  The format for your next appointment:   In Person  Provider:   Riley Lam, MD    Important Information About Sugar

## 2022-07-03 NOTE — Progress Notes (Signed)
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  History of Present Illness:    Reginald Clarke is a 31 y.o. male with a hx of Infective endocarditis with tricuspid valve replacement.  Patient notes that he is doing well.   Has done well and stayed drug free since surgery.  No chest pain or pressure .  No SOB/DOE and no PND/Orthopnea.  No weight gain or leg swelling.  No palpitations or syncope . He is going to get teeth later this year. He comes with his grandmother. 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  Past Medical History:  Diagnosis Date   Anxiety    Endocarditis    Hepatitis    hx of hep C per pt   Opiate dependence ([name])    on methadone   Smoking 12/25/2021    Past Surgical History:  Procedure Laterality Date   APPLICATION OF ANGIOVAC N/A 11/28/2021   Procedure: APPLICATION OF ANGIOVAC;  Surgeon: Corliss Skains, MD;  Location: [facility] OR;  Service: Vascular;  Laterality: N/A;   MULTIPLE EXTRACTIONS WITH ALVEOLOPLASTY N/A 04/18/2022   Procedure: MULTIPLE EXTRACTION WITH ALVEOLOPLASTY;  Surgeon: Sharman Cheek, DMD;  Location: [facility] OR;  Service: Dentistry;  Laterality: N/A;   TEE WITHOUT CARDIOVERSION N/A 11/23/2021   Procedure: TRANSESOPHAGEAL ECHOCARDIOGRAM (TEE);  Surgeon: Christell Constant, MD;  Location: [facility] ENDOSCOPY;  Service: Cardiovascular;  Laterality: N/A;   TEE WITHOUT CARDIOVERSION N/A 11/28/2021   Procedure: TRANSESOPHAGEAL ECHOCARDIOGRAM (TEE);  Surgeon: Corliss Skains, MD;  Location: [facility] OR;  Service: Open Heart Surgery;  Laterality: N/A;   TEE WITHOUT CARDIOVERSION N/A 03/07/2022   Procedure: TRANSESOPHAGEAL ECHOCARDIOGRAM (TEE);  Surgeon: Christell Constant, MD;  Location: [facility] ENDOSCOPY;  Service: Cardiovascular;   Laterality: N/A;   TEE WITHOUT CARDIOVERSION N/A 04/23/2022   Procedure: TRANSESOPHAGEAL ECHOCARDIOGRAM (TEE);  Surgeon: Corliss Skains, MD;  Location: [facility] OR;  Service: Open Heart Surgery;  Laterality: N/A;   TRICUSPID VALVE REPLACEMENT N/A 04/23/2022   Procedure: TRICUSPID VALVE REPAIR WITH MC3 TRICUSPID ANNULOPLASTY RING;  Surgeon: Corliss Skains, MD;  Location: [facility] OR;  Service: Open Heart Surgery;  Laterality: N/A;   tubes in ears     WRIST FRACTURE SURGERY      Current Medications: Current Meds  Medication Sig   acetaminophen (TYLENOL) 650 MG CR tablet Take 650 mg by mouth every 8 (eight) hours as needed for pain.   ascorbic acid (VITAMIN C) 500 MG tablet Take 1 tablet (500 mg total) by mouth 2 (two) times daily with a meal.   cyanocobalamin 1000 MCG tablet Take 1 tablet (1,000 mcg total) by mouth 2 (two) times daily with a meal.   doxycycline (VIBRA-TABS) 100 MG tablet Take 1 tablet (100 mg total) by mouth 2 (two) times daily. Take with meals.   escitalopram (LEXAPRO) 20 MG tablet Take 1 tablet (20 mg total) by mouth daily.   Ferrous Fumarate (HEMOCYTE - 106 MG FE) 324 (106 Fe) MG TABS tablet Take 1 tablet (106 mg of iron total) by mouth daily.   folic acid (FOLVITE) 1 MG tablet Take 1 tablet (1 mg total) by mouth 2 (two) times daily with a meal.   gabapentin (NEURONTIN) 300 MG capsule Take 1  capsule (300 mg total) by mouth 3 (three) times daily.   methocarbamol (ROBAXIN) 500 MG tablet Take 1 tablet (500 mg total) by mouth every 8 (eight) hours as needed for muscle spasms.   ondansetron (ZOFRAN) 4 MG tablet Take 1 tablet (4 mg total) by mouth every 8 (eight) hours as needed.   warfarin (COUMADIN) 4 MG tablet Take 1 tablet by mouth on 3/70 mmHg Patient Gender: M            HR:  97 bpm. Exam Location:  Inpatient  Procedure: Limited Echo, Limited Color Doppler and Cardiac Doppler  Indications:    endocarditis  History:        Patient has prior history of Echocardiogram examinations, most recent 03/07/2022.  Sonographer:    Delcie RochLauren Pennington RDCS Referring Phys: Effie [id number] Stanford ScotlandPRASHANT K [facility] HospitalINGH  IMPRESSIONS   1. Limited study for endocarditis 2. Left ventricular ejection fraction, by estimation, is 60 to 65%. The left ventricle has normal function. The left ventricle has no regional wall motion abnormalities. 3. 1 x 2.4 cm mobile mass attached to the septal leaflet of the valve, consistent with vegetation. An additional mass noted on the anterior leaflet measuring 1 x 1.65 cm which is also consistent with vegetation - the posterior leaflet is not well-visualized. Tricuspid valve regurgitation is moderate. 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 4. The aortic valve is tricuspid. Aortic valve regurgitation is not visualized. 5. There is moderately elevated pulmonary artery systolic pressure. 6. The inferior vena cava is dilated in size with <50% respiratory variability, suggesting right atrial pressure of 15 mmHg.  Conclusion(s)/Recommendation(s): Critical findings reported to Dr. Thedore MinsSingh and acknowledged at 4:23 pm.  FINDINGS Left Ventricle: Left ventricular ejection fraction, by estimation, is 60 to 65%. The left ventricle has normal function. The left ventricle has no regional wall motion abnormalities.  Right Ventricle: There is moderately elevated pulmonary artery systolic pressure. The tricuspid regurgitant velocity is 2.78 m/s, and with an assumed right atrial pressure of 15 mmHg, the estimated right ventricular  systolic pressure is 45.9 mmHg.  Tricuspid Valve: 1 x 2.4 cm mobile mass attached to the septal leaflet of the valve, consistent with vegetation. An additional mass noted on the anterior leaflet measuring 1 x 1.65 cm which is also consistent with vegetation - the posterior leaflet is not well-visualized. Tricuspid valve regurgitation is moderate . 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  Aortic Valve: The aortic valve is tricuspid. Aortic valve regurgitation is not visualized.  Pulmonic Valve: The pulmonic valve was not well visualized. Pulmonic valve regurgitation is trivial.  Venous: The inferior vena cava is dilated in size with less than 50% respiratory variability, suggesting right atrial pressure of 15 mmHg.   Diastology LV e' medial:  11.90 cm/s LV e' lateral: 14.70 cm/s   IVC IVC diam: 2.20 cm  AORTIC VALVE LVOT Vmax:   150.00 cm/s LVOT Vmean:  100.000 cm/s LVOT VTI:    0.230 m  TRICUSPID VALVE TR Peak grad:   30.9 mmHg TR Vmax:        278.00 cm/s  SHUNTS Systemic VTI: 0.23 m  Zoila ShutterKenneth Hilty MD Electronically signed by Zoila ShutterKenneth Hilty MD Signature Date/Time: 04/08/2022/4:39:53 PM    Final   TEE  ECHO INTRAOPERATIVE TEE 04/24/2022  Narrative *INTRAOPERATIVE TRANSESOPHAGEAL REPORT *    Patient Name:   Reginald Clarke Date of Exam: 04/23/2022 Medical Rec #:  [id number]    Height:       70.0 in Accession #:    [id number]   Weight:       175.0 lb Date of Birth:  12-Dec-1990    BSA:          1.97 m Patient Age:    31 years     BP:           106/63 mmHg Patient Gender: M            HR:           105 bpm. Exam Location:  Inpatient  Transesophogeal exam  was perform intraoperatively during surgical procedure. Patient was closely monitored under general anesthesia during the entirety of examination.  Indications:     Tricuspid endocarditis Sonographer:     Sheralyn Boatman RDCS Performing Phys: [id number] Eliezer Lofts LIGHTFOOT Diagnosing Phys: Arrie Aran MD  Complications: No  known complications during this procedure. POST-OP IMPRESSIONS _ Left Ventricle: The left ventricle is unchanged from pre-bypass. has normal systolic function, with an ejection fraction of 60%. _ Right Ventricle: The right ventricle appears unchanged from pre-bypass. _ Aorta: The aorta appears unchanged from pre-bypass. _ Left Atrial Appendage: The left atrial appendage appears unchanged from pre-bypass. _ Aortic Valve: The aortic valve appears unchanged from pre-bypass. _ Mitral Valve: The mitral valve appears unchanged from pre-bypass. There is trivial regurgitation. _ Tricuspid Valve: There is no regurgitation.No TV regurgitation s/p Tricuspid valve annuloplasty with 28 mm MC 3 ring. _ Pulmonic Valve: The pulmonic valve appears unchanged from pre-bypass. _ Interatrial Septum: The interatrial septum appears unchanged from pre-bypass. _ Comments: Post-bypass images reviewed with surgeon.  PRE-OP FINDINGS Left Ventricle: The left ventricle has normal systolic function, with an ejection fraction of 60-65%. The cavity size was normal. There is no left ventricular hypertrophy.   Right Ventricle: The right ventricle has normal systolic function. The cavity was normal. There is no increase in right ventricular wall thickness.  Left Atrium: Left atrial size was normal in size. No left atrial/left atrial appendage thrombus was detected. Left atrial appendage velocity is normal at greater than 40 cm/s.  Right Atrium: Right atrial size was dilated.  Interatrial Septum: No atrial level shunt detected by color flow Doppler.  Pericardium: Trivial pericardial effusion is present.  Mitral Valve: The mitral valve is normal in structure. Mitral valve regurgitation is trivial by color flow Doppler.  Tricuspid Valve: The tricuspid valve was dilated in appearance. Tricuspid valve regurgitation is severe by color flow Doppler. The jet is directed eccentrically. The tricuspid valve is status post repair  with an annuloplasty ring. Severe flail of the the tricuspid valve anterior cusp was present. There is a large pedunculated and mobile tricuspid valve vegetation present on the posterior cusp that measures 2.57cm mm x 1.37cm mm in size.  Aortic Valve: The aortic valve is tricuspid Aortic valve regurgitation was not visualized by color flow Doppler.   Pulmonic Valve: The pulmonic valve was normal in structure. Pulmonic valve regurgitation is not visualized by color flow Doppler.   Aorta: The ascending aorta, aortic root and aortic arch are normal in size and structure.   Arrie Aran MD Electronically signed by Arrie Aran MD Signature Date/Time: 04/24/2022/9:26:40 AM    Final            Recent Labs: 12/26/2021: TSH 2.125 06/03/2022: ALT 26; BUN 19; Creatinine, Ser 0.76; Hemoglobin 10.6; Magnesium 2.0; Platelets 169; Potassium 4.0; Sodium 138  Recent Lipid Panel No results found for: "CHOL", "TRIG", "HDL", "CHOLHDL", "VLDL", "LDLCALC", "LDLDIRECT"       Physical Exam:    VS:  BP (!) 108/58   Pulse 86   Ht 5\' 10"  (1.778 m)   Wt 206 lb (93.4 kg)   SpO2 96%   BMI 29.56 kg/m     Wt Readings from Last 3 Encounters:  07/03/22 206 lb (93.4 kg)  06/20/22 198 lb 12.8 oz (90.2 kg)  06/07/22 184 lb 12.8 oz (83.8 kg)    GEN:  Well nourished, well developed in no acute distress HEENT: Normal NECK: No JVD; No carotid bruits LYMPHATICS: No lymphadenopathy CARDIAC:  Mechanical heart sounds RESPIRATORY:  Clear to auscultation without rales, wheezing or rhonchi  ABDOMEN: Soft, non-tender, non-distended MUSCULOSKELETAL:  No edema; No deformity  SKIN: Warm and dry NEUROLOGIC:  Alert and oriented x 3 PSYCHIATRIC:  Normal affect   ASSESSMENT:    1. History of methadone use   2. H/O tricuspid valve replacement   3. Endocarditis of tricuspid valve    PLAN:    Hx of Infective Endocarditis S/p Mechanical mitral valve - involving the tricuspid valve, s/p mechanical valve -  sternal site is well healed, needs his f/u with [facility] - has no teeth; has affordable denture appoint  - Antibiotics as per ID - following with coumadin clinic  Hx of methodone use - will get EKG today - update QTC WNL (438)  One year me or APP     Medication Adjustments/Labs and Tests Ordered: Current medicines are reviewed at length with the patient today.  Concerns regarding medicines are outlined above.  Orders Placed This Encounter  Procedures   Ambulatory referral to Cardiothoracic Surgery   EKG 12-Lead   EKG 12-Lead   ECHOCARDIOGRAM COMPLETE   No orders of the defined types were placed in this encounter.   Patient Instructions  Medication Instructions:  Your physician recommends that you continue on your current medications as directed. Please refer to the Current Medication list given to you today.  *If you need a refill on your cardiac medications before your next appointment, please call your pharmacy*  Lab Work: NONE If you have labs (blood work) drawn today and your tests are completely normal, you will receive your results only by: MyChart Message (if you have MyChart) OR A paper copy in the mail If you have any lab test that is abnormal or we need to change your treatment, we will call you to review the results.   Testing/Procedures: Referral placed to [facility]  ECHO Your physician has requested that you have an echocardiogram. Echocardiography is a painless test that uses sound waves to create images of your heart. It provides your doctor with information about the size and shape of your heart and how well your heart's chambers and valves are working. This procedure takes approximately one hour. There are no restrictions for this procedure. Please do NOT wear cologne, perfume, aftershave, or lotions (deodorant is allowed). Please arrive 15 minutes prior to your appointment time.   Follow-Up: At [facility], you and your health needs are our  priority.  As part of our continuing mission to provide you with exceptional heart care, we have created designated Provider Care Teams.  These Care Teams include your primary Cardiologist (physician) and Advanced Practice Providers (APPs -  Physician Assistants and Nurse Practitioners) who all work together to provide you with the care you need, when you need it.  We recommend signing up for the patient portal called "MyChart".  Sign up information is provided on this After Visit Summary.  MyChart is used to connect with patients for Virtual Visits (Telemedicine).  Patients are able to view lab/test results, encounter notes, upcoming appointments, etc.  Non-urgent messages can be sent to your provider as well.   To learn more about what you can do with MyChart, go to [email or web address].    Your next appointment:   1 year(s)  The format for your next appointment:   In Person  Provider:   Riley Lam, MD    Important Information About Sugar         Signed, Lanay Zinda A  Izora Ribas, MD  07/03/2022 4:24 PM    [facility]

## 2022-07-05 ENCOUNTER — Other Ambulatory Visit: Payer: Self-pay

## 2022-07-05 ENCOUNTER — Encounter: Payer: Self-pay | Admitting: Internal Medicine

## 2022-07-05 ENCOUNTER — Ambulatory Visit (INDEPENDENT_AMBULATORY_CARE_PROVIDER_SITE_OTHER): Payer: BLUE CROSS/BLUE SHIELD | Admitting: Internal Medicine

## 2022-07-05 VITALS — BP 120/76 | HR 77 | Temp 98.0°F | Resp 16 | Ht 70.0 in | Wt 206.0 lb

## 2022-07-05 DIAGNOSIS — Z8679 Personal history of other diseases of the circulatory system: Secondary | ICD-10-CM

## 2022-07-05 DIAGNOSIS — B182 Chronic viral hepatitis C: Secondary | ICD-10-CM

## 2022-07-05 DIAGNOSIS — F1990 Other psychoactive substance use, unspecified, uncomplicated: Secondary | ICD-10-CM | POA: Diagnosis not present

## 2022-07-05 NOTE — Progress Notes (Signed)
[facility]  CHIEF COMPLAINT:    Follow up for hepatitis C  SUBJECTIVE:    Reginald Clarke is a 31 y.o. male with PMHx as below who presents to the clinic for hepatitis C.   Reginald has a complicated history of polysubstance use complicated by hepatitis C and MSSA/MRSA TV endocarditis.  He is status post TV replacement in September at [facility] and subsequently completed a 6 week course of daptomycin and ceftaroline on 06/04/22.  He remains on oral doxycycline 100mg  PO BID x 1 month from completion of IV therapy.  He is here today to also discuss hepatitis C treatment.  In April 2023 he was found to have chronic hepatitis C, genotype 2B.  Treatment was deferred to the outpatient setting but unfortunately he remained hospitalized for several months until just recently at the end of October.  He has now established with a new PCP and is in treatment with methadone clinic.    Please see A&P for the details of today's visit and status of the patient's medical problems.   Patient's Medications  New Prescriptions   No medications on file  Previous Medications   ACETAMINOPHEN (TYLENOL) 650 MG CR TABLET    Take 650 mg by mouth every 8 (eight) hours as needed for pain.   ASCORBIC ACID (VITAMIN C) 500 MG TABLET    Take 1 tablet (500 mg total) by mouth 2 (two) times daily with a meal.   CYANOCOBALAMIN 1000 MCG TABLET    Take 1 tablet (1,000 mcg total) by mouth 2 (two) times daily with a meal.   DOXYCYCLINE (VIBRA-TABS) 100 MG TABLET    Take 1 tablet (100 mg total) by mouth 2 (two) times daily. Take with meals.   ESCITALOPRAM (LEXAPRO) 20 MG TABLET    Take 1 tablet (20 mg total) by mouth daily.   FERROUS FUMARATE (HEMOCYTE - 106 MG FE) 324 (106 FE) MG TABS TABLET    Take 1 tablet (106 mg of iron total) by mouth daily.   FOLIC ACID (FOLVITE) 1 MG TABLET    Take 1 tablet (1 mg total) by mouth 2 (two) times daily with a meal.   GABAPENTIN (NEURONTIN) 300 MG CAPSULE     Take 1 capsule (300 mg total) by mouth 3 (three) times daily.   METHADONE HCL PO    Take 100 mg by mouth daily.   METHOCARBAMOL (ROBAXIN) 500 MG TABLET    Take 1 tablet (500 mg total) by mouth every 8 (eight) hours as needed for muscle spasms.   ONDANSETRON (ZOFRAN) 4 MG TABLET    Take 1 tablet (4 mg total) by mouth every 8 (eight) hours as needed.   WARFARIN (COUMADIN) 4 MG TABLET    Take 1 tablet by mouth on Sunday, Tuesday, Thursday, Saturday or as directed by Anticoagulation Clinic   WARFARIN (COUMADIN) 6 MG TABLET    Take one tablet by mouth on Monday, Wednesday, Friday or as directed by Anticoagulation Clinic  Modified Medications   No medications on file  Discontinued Medications   No medications on file      Past Medical History:  Diagnosis Date   Anxiety    Endocarditis    Hepatitis    hx of hep C per pt   Opiate dependence ([name])    on methadone   Smoking 12/25/2021    Social History   Tobacco Use   Smoking status: Former    Packs/day:  1.00    Years: 19.00    Total pack years: 19.00    Types: Cigarettes    Quit date: 12/20/2021    Years since quitting: 0.5   Smokeless tobacco: Never   Tobacco comments:    Currently 5 cigs/day  Vaping Use   Vaping Use: Never used  Substance Use Topics   Alcohol use: No   Drug use: Yes    Types: Methamphetamines, Marijuana, Heroin    Comment: last use in April - meth; no heroin in 3 years; occasional marijuana    History reviewed. No pertinent family history.  No Known Allergies  Review of Systems  Constitutional: Negative.   Respiratory: Negative.    Cardiovascular: Negative.   Gastrointestinal: Negative.      OBJECTIVE:    Vitals:   07/05/22 1020  BP: 120/76  Pulse: 77  Resp: 16  Temp: 98 F (36.7 C)  TempSrc: Oral  SpO2: 97%  Weight: 206 lb (93.4 kg)  Height: 5\' 10"  (1.778 m)   Body mass index is 29.56 kg/m.  Physical Exam Constitutional:      Appearance: Normal appearance.  HENT:     Head:  Normocephalic and atraumatic.  Eyes:     Extraocular Movements: Extraocular movements intact.     Conjunctiva/sclera: Conjunctivae normal.  Pulmonary:     Effort: Pulmonary effort is normal. No respiratory distress.  Abdominal:     General: There is no distension.     Palpations: Abdomen is soft.  Musculoskeletal:        General: Normal range of motion.  Skin:    General: Skin is warm and dry.  Neurological:     General: No focal deficit present.     Mental Status: He is alert and oriented to person, place, and time.  Psychiatric:        Mood and Affect: Mood normal.        Behavior: Behavior normal.      Labs and Microbiology:    Latest Ref Rng & Units 06/03/2022    2:33 AM 05/27/2022    4:52 AM 05/20/2022    3:42 AM  CBC  WBC 4.0 - 10.5 K/uL 4.9  4.7  4.5   Hemoglobin 13.0 - 17.0 g/dL 05/22/2022  82.4  9.5   Hematocrit 39.0 - 52.0 % 34.5  32.5  30.2   Platelets 150 - 400 K/uL 169  176  237       Latest Ref Rng & Units 06/03/2022    2:33 AM 05/27/2022    4:52 AM 05/20/2022    3:42 AM  CMP  Glucose 70 - 99 mg/dL 05/22/2022  90  90   BUN 6 - 20 mg/dL 19  16  18    Creatinine 0.61 - 1.24 mg/dL 361   4.43   Sodium 135 - 145 mmol/L 138  140  139   Potassium 3.5 - 5.1 mmol/L 4.0  4.2  4.0   Chloride 98 - 111 mmol/L 101  102  102   CO2 22 - 32 mmol/L 27  29  28    Calcium 8.9 - 10.3 mg/dL 9.5  9.5  9.5   Total Protein 6.5 - 8.1 g/dL 6.7  6.8  6.6   Total Bilirubin 0.3 - 1.2 mg/dL 0.4  0.5  0.2   Alkaline Phos 38 - 126 U/L 81  84  78   AST 15 - 41 U/L 27  30  24    ALT 0 - 44 U/L 26  29  22        ASSESSMENT & PLAN:    History of bacterial endocarditis He completed 6 week course of IV daptomycin and ceftaroline following valve replacement.  Currently on a month of oral doxycyline as tail coverage following initial IV therapy.  He will complete this in a few days and then stop antibiotics.  He does not have any teeth right now and has appointment Monday with affordable  dentures.  Advised that in the future any dental work would need prophylactic antibiotics given hx of endocarditis.   Hepatitis C, chronic ([name]) He has chronic hepatitis C, genotype 2B.  His labs to determine best treatment course were done over 6 months ago so we will repeat those again today to determine best anti-virals to treat with.  Discussed treatment plan with him and will follow up in 2 weeks to get started on treatment.   Substance use disorder Currently in remission and doing well on methadone treatment.    Orders Placed This Encounter  Procedures   Hepatitis B surface antibody,qualitative   Hepatitis B surface antigen   Hepatitis B core antibody, total   Hepatitis C genotype   Hepatic function panel   CBC   Protime-INR   Hepatitis C RNA quantitative   Liver Fibrosis, FibroTest-ActiTest   HIV Antibody (routine testing w rflx)   Hepatitis A antibody, total        Vedia Coffer for Infectious Disease [facility] 07/05/2022, 11:00 AM

## 2022-07-05 NOTE — Assessment & Plan Note (Signed)
He has chronic hepatitis C, genotype 2B.  His labs to determine best treatment course were done over 6 months ago so we will repeat those again today to determine best anti-virals to treat with.  Discussed treatment plan with him and will follow up in 2 weeks to get started on treatment.

## 2022-07-05 NOTE — Assessment & Plan Note (Signed)
Currently in remission and doing well on methadone treatment.

## 2022-07-05 NOTE — Assessment & Plan Note (Addendum)
He completed 6 week course of IV daptomycin and ceftaroline following valve replacement.  Currently on a month of oral doxycyline as tail coverage following initial IV therapy.  He will complete this in a few days and then stop antibiotics.  He does not have any teeth right now and has appointment Monday with affordable dentures.  Advised that in the future any dental work would need prophylactic antibiotics given hx of endocarditis.

## 2022-07-11 ENCOUNTER — Encounter: Payer: Self-pay | Admitting: Internal Medicine

## 2022-07-11 ENCOUNTER — Ambulatory Visit: Payer: BLUE CROSS/BLUE SHIELD | Attending: Cardiovascular Disease

## 2022-07-11 ENCOUNTER — Encounter: Payer: Self-pay | Admitting: Pulmonary Disease

## 2022-07-11 DIAGNOSIS — Z952 Presence of prosthetic heart valve: Secondary | ICD-10-CM | POA: Diagnosis not present

## 2022-07-11 DIAGNOSIS — I079 Rheumatic tricuspid valve disease, unspecified: Secondary | ICD-10-CM | POA: Diagnosis not present

## 2022-07-11 DIAGNOSIS — Z5181 Encounter for therapeutic drug level monitoring: Secondary | ICD-10-CM

## 2022-07-11 LAB — LIVER FIBROSIS, FIBROTEST-ACTITEST
ALT: 31 U/L (ref 9–46)
Alpha-2-Macroglobulin: 177 mg/dL (ref 106–279)
Apolipoprotein A1: 180 mg/dL — ABNORMAL HIGH (ref 94–176)
Bilirubin: 0.4 mg/dL (ref 0.2–1.2)
Fibrosis Score: 0.08
GGT: 33 U/L (ref 3–90)
Haptoglobin: 112 mg/dL (ref 43–212)
Necroinflammat ACT Score: 0.11
Reference ID: 4669930

## 2022-07-11 LAB — HIV ANTIBODY (ROUTINE TESTING W REFLEX): HIV 1&2 Ab, 4th Generation: NONREACTIVE

## 2022-07-11 LAB — HEPATITIS C GENOTYPE: HCV Genotype: 2

## 2022-07-11 LAB — HEPATIC FUNCTION PANEL
AG Ratio: 1.5 (calc) (ref 1.0–2.5)
ALT: 33 U/L (ref 9–46)
AST: 27 U/L (ref 10–40)
Albumin: 4.6 g/dL (ref 3.6–5.1)
Alkaline phosphatase (APISO): 84 U/L (ref 36–130)
Bilirubin, Direct: 0.1 mg/dL (ref 0.0–0.2)
Globulin: 3 g/dL (calc) (ref 1.9–3.7)
Indirect Bilirubin: 0.3 mg/dL (calc) (ref 0.2–1.2)
Total Bilirubin: 0.4 mg/dL (ref 0.2–1.2)
Total Protein: 7.6 g/dL (ref 6.1–8.1)

## 2022-07-11 LAB — HEPATITIS B CORE ANTIBODY, TOTAL: Hep B Core Total Ab: NONREACTIVE

## 2022-07-11 LAB — POCT INR: INR: 1.4 — AB (ref 2.0–3.0)

## 2022-07-11 LAB — CBC
HCT: 39.3 % (ref 38.5–50.0)
Hemoglobin: 12.7 g/dL — ABNORMAL LOW (ref 13.2–17.1)
MCH: 27 pg (ref 27.0–33.0)
MCHC: 32.3 g/dL (ref 32.0–36.0)
MCV: 83.4 fL (ref 80.0–100.0)
MPV: 10.4 fL (ref 7.5–12.5)
Platelets: 211 10*3/uL (ref 140–400)
RBC: 4.71 10*6/uL (ref 4.20–5.80)
RDW: 16.7 % — ABNORMAL HIGH (ref 11.0–15.0)
WBC: 5 10*3/uL (ref 3.8–10.8)

## 2022-07-11 LAB — PROTIME-INR
INR: 1.6 — ABNORMAL HIGH
Prothrombin Time: 16.5 s — ABNORMAL HIGH (ref 9.0–11.5)

## 2022-07-11 LAB — HEPATITIS C RNA QUANTITATIVE
HCV Quantitative Log: 6.85 log IU/mL — ABNORMAL HIGH
HCV RNA, PCR, QN: 7150000 IU/mL — ABNORMAL HIGH

## 2022-07-11 LAB — HEPATITIS A ANTIBODY, TOTAL: Hepatitis A AB,Total: REACTIVE — AB

## 2022-07-11 LAB — HEPATITIS B SURFACE ANTIBODY,QUALITATIVE: Hep B S Ab: REACTIVE — AB

## 2022-07-11 LAB — HEPATITIS B SURFACE ANTIGEN: Hepatitis B Surface Ag: NONREACTIVE

## 2022-07-11 NOTE — Patient Instructions (Signed)
Description   Today take 8mg  and then START taking warfarin 6mg  daily EXCEPT 4mg  Tuesdays and Thursdays  Recheck INR in 1 week.  Stay consistent with greens/Ensure each week (2 per day)  Anticoagulation Clinic [phone] *PER SURGEON NOTE STOP WARFARIN ON 07/23/2022*

## 2022-07-15 ENCOUNTER — Telehealth: Payer: Self-pay | Admitting: Internal Medicine

## 2022-07-15 NOTE — Telephone Encounter (Signed)
Adding three more Rx refills to the request = seven total.  escitalopram (LEXAPRO) 20 MG tablet [[id number]]  Ferrous Fumarate (HEMOCYTE - 106 MG FE) 324 (106 Fe) MG TABS tablet [[id number]]  methocarbamol (ROBAXIN) 500 MG tablet [[id number]]

## 2022-07-15 NOTE — Telephone Encounter (Signed)
Patient called to check on status of medications- was advised process does take 48/72 hrs - patient stated he was out of medications - patient was educated to call office before medications run out patient agreed.

## 2022-07-15 NOTE — Telephone Encounter (Signed)
.   Encourage patient to contact the pharmacy for refills or they can request refills through [facility]  LAST APPOINTMENT DATE:  06/20/22  NEXT APPOINTMENT DATE:  MEDICATION: vitamin B-12 (CYANOCOBALAMIN) tablet 100 mcg   [[id number]]  gabapentin (NEURONTIN) 300 MG capsule    ascorbic acid (VITAMIN C) 500 MG tablet    folic acid (FOLVITE) 1 MG tablet     Is the patient out of medication?   PHARMACY:  [facility] [address], [place] - [place] [address] Phone: [phone]  Fax: [phone]      Let patient know to contact pharmacy at the end of the day to make sure medication is ready.  Please notify patient to allow 48-72 hours to process

## 2022-07-16 ENCOUNTER — Other Ambulatory Visit (HOSPITAL_COMMUNITY): Payer: Self-pay

## 2022-07-16 ENCOUNTER — Encounter: Payer: Self-pay | Admitting: Pulmonary Disease

## 2022-07-16 ENCOUNTER — Telehealth: Payer: Self-pay

## 2022-07-16 NOTE — Telephone Encounter (Signed)
[facility] Patient Advocate Encounter   Received notification from [facility] [employer] that prior authorization for Mavyret is required.   PA submitted on 07/16/22 Key [id number] Status is pending    [facility] will continue to follow.   Clearance Coots, CPhT Specialty Pharmacy Patient [facility] for Infectious Disease Phone: [phone] Fax:  [phone]

## 2022-07-17 ENCOUNTER — Ambulatory Visit: Payer: BLUE CROSS/BLUE SHIELD | Attending: Cardiovascular Disease

## 2022-07-17 ENCOUNTER — Other Ambulatory Visit: Payer: Self-pay

## 2022-07-17 DIAGNOSIS — Z952 Presence of prosthetic heart valve: Secondary | ICD-10-CM | POA: Diagnosis not present

## 2022-07-17 DIAGNOSIS — M62838 Other muscle spasm: Secondary | ICD-10-CM

## 2022-07-17 DIAGNOSIS — Z5181 Encounter for therapeutic drug level monitoring: Secondary | ICD-10-CM

## 2022-07-17 DIAGNOSIS — E639 Nutritional deficiency, unspecified: Secondary | ICD-10-CM

## 2022-07-17 DIAGNOSIS — D649 Anemia, unspecified: Secondary | ICD-10-CM

## 2022-07-17 DIAGNOSIS — K219 Gastro-esophageal reflux disease without esophagitis: Secondary | ICD-10-CM

## 2022-07-17 DIAGNOSIS — J15212 Pneumonia due to Methicillin resistant Staphylococcus aureus: Secondary | ICD-10-CM

## 2022-07-17 LAB — POCT INR: INR: 2.1 (ref 2.0–3.0)

## 2022-07-17 MED ORDER — FOLIC ACID 1 MG PO TABS: 1.0000 mg | ORAL_TABLET | Freq: Two times a day (BID) | ORAL | 0 refills | Status: DC

## 2022-07-17 MED ORDER — ASCORBIC ACID 500 MG PO TABS: 500.0000 mg | ORAL_TABLET | Freq: Two times a day (BID) | ORAL | 0 refills | Status: DC

## 2022-07-17 MED ORDER — FERROUS FUMARATE 324 (106 FE) MG PO TABS: 1.0000 | ORAL_TABLET | Freq: Every day | ORAL | 0 refills | Status: DC

## 2022-07-17 MED ORDER — METHOCARBAMOL 500 MG PO TABS: 500.0000 mg | ORAL_TABLET | Freq: Three times a day (TID) | ORAL | 0 refills | Status: DC | PRN

## 2022-07-17 MED ORDER — ESCITALOPRAM OXALATE 20 MG PO TABS: 20.0000 mg | ORAL_TABLET | Freq: Every day | ORAL | 0 refills | Status: DC

## 2022-07-17 MED ORDER — GABAPENTIN 300 MG PO CAPS: 300.0000 mg | ORAL_CAPSULE | Freq: Three times a day (TID) | ORAL | 0 refills | Status: DC

## 2022-07-17 MED ORDER — CYANOCOBALAMIN 1000 MCG PO TABS: 1000.0000 ug | ORAL_TABLET | Freq: Two times a day (BID) | ORAL | 0 refills | Status: DC

## 2022-07-17 NOTE — Patient Instructions (Signed)
Description   Continue taking warfarin 6mg  daily EXCEPT 4mg  Tuesdays and Thursdays  Recheck INR in 1 week.  Stay consistent with greens/Ensure each week (2 per day)  Anticoagulation Clinic [phone] *PER SURGEON NOTE STOP WARFARIN ON 07/23/2022*

## 2022-07-17 NOTE — Telephone Encounter (Signed)
Sent in 90 day refills for all prescriptions to [facility] pharmacy on file.

## 2022-07-18 ENCOUNTER — Other Ambulatory Visit: Payer: Self-pay

## 2022-07-18 ENCOUNTER — Ambulatory Visit (INDEPENDENT_AMBULATORY_CARE_PROVIDER_SITE_OTHER): Payer: BLUE CROSS/BLUE SHIELD | Admitting: Internal Medicine

## 2022-07-18 ENCOUNTER — Other Ambulatory Visit (HOSPITAL_COMMUNITY): Payer: Self-pay

## 2022-07-18 ENCOUNTER — Ambulatory Visit: Payer: BLUE CROSS/BLUE SHIELD

## 2022-07-18 ENCOUNTER — Encounter: Payer: Self-pay | Admitting: Internal Medicine

## 2022-07-18 ENCOUNTER — Telehealth: Payer: Self-pay

## 2022-07-18 VITALS — BP 134/79 | HR 75 | Temp 97.7°F | Ht 70.0 in | Wt 200.0 lb

## 2022-07-18 DIAGNOSIS — B182 Chronic viral hepatitis C: Secondary | ICD-10-CM | POA: Diagnosis not present

## 2022-07-18 DIAGNOSIS — Z8679 Personal history of other diseases of the circulatory system: Secondary | ICD-10-CM | POA: Diagnosis not present

## 2022-07-18 NOTE — Assessment & Plan Note (Signed)
We will plan for Mavyret daily x 8 weeks for HCV treatment.  Discussed importance of adherence and interaction with Warfarin but this should likely not be an issue as his Warfarin is set to end on 07/23/22.  Pharmacy is working diligently on a PA for him to get this started.  Will have him see Cassie or Marchelle Folks for initiation of Mavyret next week (at which time he will be done with Warfarin) and I will see him back on my schedule in about 5 weeks after he has been on treatment for 1 month.

## 2022-07-18 NOTE — Progress Notes (Signed)
[facility]  CHIEF COMPLAINT:    Follow up for hepatitis C  SUBJECTIVE:    Reginald Clarke is a 31 y.o. male with PMHx as below who presents to the clinic for hepatitis C.   Patient here to discuss hepatitis C treatment.  Plan is for Mavyret x 8 weeks. 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  Hepatitis C viral load at initiation of treatment: 7,150,000 copies Hepatitis C genotype: 2 Fibrosis Score: F0 AST/ALT: Normal Hepatitis A: Immune Hepatitis B: Immune HIV: Non-reactive    Please see A&P for the details of today's visit and status of the patient's medical problems.   Patient's Medications  New Prescriptions   No medications on file  Previous Medications   ACETAMINOPHEN (TYLENOL) 650 MG CR TABLET    Take 650 mg by mouth every 8 (eight) hours as needed for pain.   ASCORBIC ACID (VITAMIN C) 500 MG TABLET    Take 1 tablet (500 mg total) by mouth 2 (two) times daily with a meal.   CYANOCOBALAMIN 1000 MCG TABLET    Take 1 tablet (1,000 mcg total) by mouth 2 (two) times daily with a meal.   DOXYCYCLINE (VIBRA-TABS) 100 MG TABLET    Take 1 tablet (100 mg total) by mouth 2 (two) times daily. Take with meals.   ESCITALOPRAM (LEXAPRO) 20 MG TABLET    Take 1 tablet (20 mg total) by mouth daily.   FERROUS FUMARATE (HEMOCYTE - 106 MG FE) 324 (106 FE) MG TABS TABLET    Take 1 tablet (106 mg of iron total) by mouth daily.   FOLIC ACID (FOLVITE) 1 MG TABLET    Take 1 tablet (1 mg total) by mouth 2 (two) times daily with a meal.   GABAPENTIN (NEURONTIN) 300 MG CAPSULE    Take 1 capsule (300 mg total) by mouth 3 (three) times daily.   METHADONE HCL PO    Take 100 mg by mouth daily.   METHOCARBAMOL (ROBAXIN) 500 MG TABLET    Take 1 tablet (500 mg total) by mouth every 8 (eight) hours as needed for muscle spasms.   ONDANSETRON (ZOFRAN) 4 MG TABLET    Take 1 tablet (4 mg total) by mouth every 8 (eight)  hours as needed.   WARFARIN (COUMADIN) 4 MG TABLET    Take 1 tablet by mouth on Sunday, Tuesday, Thursday, Saturday or as directed by Anticoagulation Clinic   WARFARIN (COUMADIN) 6 MG TABLET    Take one tablet by mouth on Monday, Wednesday, Friday or as directed by Anticoagulation Clinic  Modified Medications   No medications on file  Discontinued Medications   No medications on file      Past Medical History:  Diagnosis Date   Anxiety    Endocarditis    Hepatitis    hx of hep C per pt   Opiate dependence ([name])    on methadone   Smoking 12/25/2021    Social History   Tobacco Use   Smoking status: Former    Packs/day: 1.00    Years: 19.00    Total pack years: 19.00    Types: Cigarettes    Quit date: 12/20/2021    Years since quitting: 0.5   Smokeless tobacco: Never   Tobacco comments:    Currently 5 cigs/day  Vaping Use   Vaping Use: Never used  Substance Use Topics  Alcohol use: No   Drug use: Yes    Types: Methamphetamines, Marijuana, Heroin    Comment: last use in April - meth; no heroin in 3 years; occasional marijuana    No family history on file.  No Known Allergies  Review of Systems  Constitutional: Negative.   Respiratory: Negative.    Cardiovascular: Negative.   Gastrointestinal: Negative.      OBJECTIVE:    Vitals:   07/18/22 1021  BP: 134/79  Pulse: 75  Temp: 97.7 F (36.5 C)  TempSrc: Temporal  SpO2: 100%  Weight: 200 lb (90.7 kg)  Height: 5\' 10"  (1.778 m)   Body mass index is 28.7 kg/m.  Physical Exam Constitutional:      Appearance: Normal appearance.  HENT:     Head: Normocephalic and atraumatic.  Eyes:     Extraocular Movements: Extraocular movements intact.     Conjunctiva/sclera: Conjunctivae normal.  Pulmonary:     Effort: Pulmonary effort is normal. No respiratory distress.  Skin:    General: Skin is warm and dry.  Neurological:     General: No focal deficit present.     Mental Status: He is alert and oriented to  person, place, and time.  Psychiatric:        Mood and Affect: Mood normal.        Behavior: Behavior normal.      Labs and Microbiology:    Latest Ref Rng & Units 07/05/2022   11:03 AM 06/03/2022    2:33 AM 05/27/2022    4:52 AM  CBC  WBC 3.8 - 10.8 Thousand/uL 5.0  4.9  4.7   Hemoglobin 13.2 - 17.1 g/dL 05/29/2022  35.0  09.3   Hematocrit 38.5 - 50.0 % 39.3  34.5  32.5   Platelets 140 - 400 Thousand/uL 211  169  176       Latest Ref Rng & Units 07/05/2022   11:03 AM 06/03/2022    2:33 AM 05/27/2022    4:52 AM  CMP  Glucose 70 - 99 mg/dL  05/29/2022  90   BUN 6 - 20 mg/dL  19  16   Creatinine 299 - 1.24 mg/dL  3.71  6.96   Sodium 7.89 - 145 mmol/L  138  140   Potassium 3.5 - 5.1 mmol/L  4.0  4.2   Chloride 98 - 111 mmol/L  101  102   CO2 22 - 32 mmol/L  27  29   Calcium 8.9 - 10.3 mg/dL  9.5  9.5   Total Protein 6.1 - 8.1 g/dL 7.6  6.7  6.8   Total Bilirubin 0.2 - 1.2 mg/dL 0.4  0.4  0.5   Alkaline Phos 38 - 126 U/L  81  84   AST 10 - 40 U/L 27  27  30    ALT 9 - 46 U/L 9 - 46 U/L 33    31  26  29        ASSESSMENT & PLAN:    Hepatitis C, chronic ([name]) We will plan for Mavyret daily x 8 weeks for HCV treatment.  Discussed importance of adherence and interaction with Warfarin but this should likely not be an issue as his Warfarin is set to end on 07/23/22.  Pharmacy is working diligently on a PA for him to get this started.  Will have him see Cassie or for initiation of Mavyret next week (at which time he will be done with Warfarin) and I will see him back on  my schedule in about 5 weeks after he has been on treatment for 1 month.  History of bacterial endocarditis He has completed treatment and doing well at this time.       Vedia Coffer for Infectious Disease [facility] 07/18/2022, 10:39 AM

## 2022-07-18 NOTE — Patient Instructions (Signed)
See pharmacy clinic in 1 week  See Dr. Earlene Plater in 5 weeks

## 2022-07-18 NOTE — Telephone Encounter (Signed)
[facility] Patient Advocate Encounter  Prior Authorization for Mavyret has been approved.    PA# [id number] Effective dates: 07/16/22 through 01/12/23 The member is limited to 8 weeks of therapy within the approval dates.  Patients co-pay is $0.00.   Prescription can be filled at [facility].  [facility] will continue to follow.  Clearance Coots, CPhT Specialty Pharmacy Patient [facility] for Infectious Disease Phone: [phone] Fax:  [phone]

## 2022-07-18 NOTE — Assessment & Plan Note (Signed)
He has completed treatment and doing well at this time.

## 2022-07-19 ENCOUNTER — Other Ambulatory Visit (HOSPITAL_COMMUNITY): Payer: Self-pay

## 2022-07-19 ENCOUNTER — Other Ambulatory Visit: Payer: Self-pay | Admitting: Pharmacist

## 2022-07-19 ENCOUNTER — Ambulatory Visit: Payer: BLUE CROSS/BLUE SHIELD | Admitting: Thoracic Surgery (Cardiothoracic Vascular Surgery)

## 2022-07-19 DIAGNOSIS — B182 Chronic viral hepatitis C: Secondary | ICD-10-CM

## 2022-07-19 MED ORDER — MAVYRET 100-40 MG PO TABS
3.0000 | ORAL_TABLET | Freq: Every day | ORAL | 1 refills | Status: DC
  Filled 2022-07-19: qty 84, fill #0
  Filled 2022-07-22: qty 84, 28d supply, fill #0
  Filled 2022-08-14 – 2022-08-19 (×2): qty 84, 28d supply, fill #1

## 2022-07-22 ENCOUNTER — Other Ambulatory Visit (HOSPITAL_COMMUNITY): Payer: Self-pay

## 2022-07-22 ENCOUNTER — Ambulatory Visit (INDEPENDENT_AMBULATORY_CARE_PROVIDER_SITE_OTHER): Payer: BLUE CROSS/BLUE SHIELD | Admitting: Internal Medicine

## 2022-07-22 ENCOUNTER — Encounter: Payer: Self-pay | Admitting: Internal Medicine

## 2022-07-22 VITALS — BP 118/73 | HR 73 | Temp 98.2°F | Resp 12 | Ht 70.0 in | Wt 203.2 lb

## 2022-07-22 DIAGNOSIS — R7881 Bacteremia: Secondary | ICD-10-CM

## 2022-07-22 DIAGNOSIS — E291 Testicular hypofunction: Secondary | ICD-10-CM | POA: Diagnosis not present

## 2022-07-22 DIAGNOSIS — N528 Other male erectile dysfunction: Secondary | ICD-10-CM | POA: Diagnosis not present

## 2022-07-22 DIAGNOSIS — N529 Male erectile dysfunction, unspecified: Secondary | ICD-10-CM | POA: Diagnosis not present

## 2022-07-22 DIAGNOSIS — B9562 Methicillin resistant Staphylococcus aureus infection as the cause of diseases classified elsewhere: Secondary | ICD-10-CM

## 2022-07-22 MED ORDER — TADALAFIL 20 MG PO TABS: 10.0000 mg | ORAL_TABLET | ORAL | 3 refills | Status: DC | PRN

## 2022-07-22 NOTE — Progress Notes (Signed)
[facility]: [phone]   Routine Medical Office Visit  Patient:  Reginald Clarke      Age: 31 y.o.       Sex:  male  Date:   07/22/2022  PCP:    Lula Olszewski, MD   Today's Healthcare Provider: Lula Olszewski, MD  Assessment/Plan:   Reginald was seen today for discuss nutritional habits..  Erectile dysfunction, unspecified erectile dysfunction type Overview: Patient reports severe erectile dysfunction associated with methadone treatment and on Lexapro.  I strongly suspect this is due to testosterone suppression by methadone and Lexapro's effect on libido and orgasm function.    Assessment & Plan: I explained that he could destabilize quite severely if he were to try to sudden stop either methadone or Lexapro that if he wanted to slowly taper the dose that is acceptable to try on either 1 but very slow taper.  In the meantime I am going to start him on a PDE 5 inhibitor to help with erections and check his testosterone levels and replace it if low which she will be.  I therefore also put him on a controlled substance contract because he elected to go with the gel versus the pellets to get started although he does show some interest in the pellets    Orders: -     Testosterone; Standing -     Tadalafil; Take 0.5-1 tablets (10-20 mg total) by mouth every other day as needed for erectile dysfunction.  Dispense: 90 tablet; Refill: 3 -     Drug Screen, 5 Panel, Ur  MRSA bacteremia Overview: Reports he is taking doxycycline Following with Dr. Ilsa Iha ID Was due to interruption in methadone treatment, has resumed and intends to permanently discontinue ivdu. Done with antibiotics and denies any fevers lately  Assessment & Plan: He reports feeling better than he ever has and that he has not had any relapse with IV for a very very long time now and I encouraged him to keep it up and reminded him of how serious things could have been this last time and if it comes  back   Other male erectile dysfunction Overview: Patient reports severe erectile dysfunction associated with methadone treatment and on Lexapro.  I strongly suspect this is due to testosterone suppression by methadone and Lexapro's effect on libido and orgasm function.    Assessment & Plan: I explained that he could destabilize quite severely if he were to try to sudden stop either methadone or Lexapro that if he wanted to slowly taper the dose that is acceptable to try on either 1 but very slow taper.  In the meantime I am going to start him on a PDE 5 inhibitor to help with erections and check his testosterone levels and replace it if low which she will be.  I therefore also put him on a controlled substance contract because he elected to go with the gel versus the pellets to get started although he does show some interest in the pellets    Orders: -     Testosterone; Standing -     Tadalafil; Take 0.5-1 tablets (10-20 mg total) by mouth every other day as needed for erectile dysfunction.  Dispense: 90 tablet; Refill: 3    See AVS- lots of discussion of Cialis and erectile dysfunction and low testosterone from methadone given and briefed on  Today's key discussion points and After Visit Summary (AVS) reminders.  Problem-associated medical records were reviewed with him during the appointment  He was encouraged to contact our office by phone or message via MyChart if he has any questions or concerns regarding our treatment plan (see AVS). He was given an opportunity to ask questions/clarifications about any aspect of the diagnosis and treatment plan at today's visit. Common side effects, risks, benefits, and alternatives for medications and treatment plan prescribed today were discussed. He expressed understanding of the given instructions. No barriers to understanding were identified     Subjective:   Reginald Gunning is a 31 y.o. male with the following chart data reviewed: Past Medical  History:  Diagnosis Date   Anxiety    Endocarditis    Hepatitis    hx of hep C per pt   Opiate dependence ([name])    on methadone   Smoking 12/25/2021   Outpatient Medications Prior to Visit  Medication Sig   acetaminophen (TYLENOL) 650 MG CR tablet Take 650 mg by mouth every 8 (eight) hours as needed for pain.   ascorbic acid (VITAMIN C) 500 MG tablet Take 1 tablet (500 mg total) by mouth 2 (two) times daily with a meal.   cyanocobalamin 1000 MCG tablet Take 1 tablet (1,000 mcg total) by mouth 2 (two) times daily with a meal.   escitalopram (LEXAPRO) 20 MG tablet Take 1 tablet (20 mg total) by mouth daily.   Ferrous Fumarate (HEMOCYTE - 106 MG FE) 324 (106 Fe) MG TABS tablet Take 1 tablet (106 mg of iron total) by mouth daily.   folic acid (FOLVITE) 1 MG tablet Take 1 tablet (1 mg total) by mouth 2 (two) times daily with a meal.   gabapentin (NEURONTIN) 300 MG capsule Take 1 capsule (300 mg total) by mouth 3 (three) times daily.   Glecaprevir-Pibrentasvir (MAVYRET) 100-40 MG TABS Take 3 tablets by mouth daily with breakfast.   METHADONE HCL PO Take 100 mg by mouth daily.   methocarbamol (ROBAXIN) 500 MG tablet Take 1 tablet (500 mg total) by mouth every 8 (eight) hours as needed for muscle spasms.   ondansetron (ZOFRAN) 4 MG tablet Take 1 tablet (4 mg total) by mouth every 8 (eight) hours as needed.   warfarin (COUMADIN) 4 MG tablet Take 1 tablet by mouth on Sunday, Tuesday, Thursday, Saturday or as directed by Anticoagulation Clinic   warfarin (COUMADIN) 6 MG tablet Take one tablet by mouth on Monday, Wednesday, Friday or as directed by Anticoagulation Clinic   No facility-administered medications prior to visit.     He presented today reporting reason for visit as: Chief Complaint  Patient presents with   Discuss nutritional habits.     Problem-focused charting was used to record today's medical interview as problems and problem overview medical record updates as follows: Problem   Erectile Dysfunction   Patient reports severe erectile dysfunction associated with methadone treatment and on Lexapro.  I strongly suspect this is due to testosterone suppression by methadone and Lexapro's effect on libido and orgasm function.     Mrsa Bacteremia   Reports he is taking doxycycline Following with Dr. Ilsa Iha ID Was due to interruption in methadone treatment, has resumed and intends to permanently discontinue ivdu. Done with antibiotics and denies any fevers lately             Objective:  Physical Exam  BP 118/73 (BP Location: Left Arm, Patient Position: Sitting)   Pulse 73   Temp 98.2 F (36.8 C) (Temporal)   Resp 12   Ht 5\' 10"  (1.778 m)   Wt 203 lb  3.2 oz (92.2 kg)   SpO2 98%   BMI 29.16 kg/m  Vital signs reviewed.  Nursing notes reviewed. General Appearance/Constitutional:   Overweight male in no acute distress Musculoskeletal: All extremities are intact.  Neurological:  Awake, alert,  No obvious focal neurological deficits or cognitive impairments Psychiatric:  Appropriate mood, pleasant demeanor Problem-specific findings:  looks well, no signs of intoxication       Lula Olszewski, MD

## 2022-07-22 NOTE — Assessment & Plan Note (Signed)
He reports feeling better than he ever has and that he has not had any relapse with IV for a very very long time now and I encouraged him to keep it up and reminded him of how serious things could have been this last time and if it comes back

## 2022-07-22 NOTE — Assessment & Plan Note (Signed)
I explained that he could destabilize quite severely if he were to try to sudden stop either methadone or Lexapro that if he wanted to slowly taper the dose that is acceptable to try on either 1 but very slow taper.  In the meantime I am going to start him on a PDE 5 inhibitor to help with erections and check his testosterone levels and replace it if low which she will be.  I therefore also put him on a controlled substance contract because he elected to go with the gel versus the pellets to get started although he does show some interest in the pellets

## 2022-07-23 ENCOUNTER — Ambulatory Visit: Payer: BLUE CROSS/BLUE SHIELD | Attending: Cardiology

## 2022-07-23 ENCOUNTER — Other Ambulatory Visit (INDEPENDENT_AMBULATORY_CARE_PROVIDER_SITE_OTHER): Payer: BLUE CROSS/BLUE SHIELD

## 2022-07-23 ENCOUNTER — Telehealth: Payer: Self-pay

## 2022-07-23 DIAGNOSIS — N529 Male erectile dysfunction, unspecified: Secondary | ICD-10-CM

## 2022-07-23 DIAGNOSIS — Z952 Presence of prosthetic heart valve: Secondary | ICD-10-CM | POA: Diagnosis not present

## 2022-07-23 DIAGNOSIS — Z5181 Encounter for therapeutic drug level monitoring: Secondary | ICD-10-CM

## 2022-07-23 DIAGNOSIS — N528 Other male erectile dysfunction: Secondary | ICD-10-CM

## 2022-07-23 LAB — POCT INR: INR: 2.2 (ref 2.0–3.0)

## 2022-07-23 LAB — TESTOSTERONE: Testosterone: 188.03 ng/dL — ABNORMAL LOW (ref 300.00–890.00)

## 2022-07-23 NOTE — Patient Instructions (Signed)
Discontinuing today 12/19 *PER SURGEON NOTE STOP WARFARIN ON 07/23/2022*

## 2022-07-23 NOTE — Telephone Encounter (Signed)
[facility] Patient Advocate Encounter  Patient's medications have been couriered to [facility] from [facility] and will be picked up 07/25/22.  1st Mavyret box  Clearance Coots , CPhT Specialty Pharmacy Patient [facility] Phone: [phone] Fax:  [phone]

## 2022-07-24 ENCOUNTER — Other Ambulatory Visit (INDEPENDENT_AMBULATORY_CARE_PROVIDER_SITE_OTHER): Payer: BLUE CROSS/BLUE SHIELD

## 2022-07-24 ENCOUNTER — Ambulatory Visit (HOSPITAL_COMMUNITY): Payer: BLUE CROSS/BLUE SHIELD | Attending: Internal Medicine

## 2022-07-24 DIAGNOSIS — I079 Rheumatic tricuspid valve disease, unspecified: Secondary | ICD-10-CM

## 2022-07-24 DIAGNOSIS — F1191 Opioid use, unspecified, in remission: Secondary | ICD-10-CM

## 2022-07-24 DIAGNOSIS — Z952 Presence of prosthetic heart valve: Secondary | ICD-10-CM

## 2022-07-24 DIAGNOSIS — N529 Male erectile dysfunction, unspecified: Secondary | ICD-10-CM | POA: Diagnosis not present

## 2022-07-24 DIAGNOSIS — I361 Nonrheumatic tricuspid (valve) insufficiency: Secondary | ICD-10-CM | POA: Diagnosis not present

## 2022-07-24 DIAGNOSIS — N528 Other male erectile dysfunction: Secondary | ICD-10-CM

## 2022-07-24 LAB — TESTOSTERONE: Testosterone: 191.49 ng/dL — ABNORMAL LOW (ref 300.00–890.00)

## 2022-07-24 LAB — ECHOCARDIOGRAM COMPLETE
Area-P 1/2: 3.99 cm2
S' Lateral: 3.4 cm

## 2022-07-24 MED ORDER — TESTOSTERONE 20.25 MG/1.25GM (1.62%) TD GEL: 40.5000 mg | Freq: Every day | TRANSDERMAL | 0 refills | Status: DC

## 2022-07-24 NOTE — Progress Notes (Signed)
Tests confirm the low testosterone I'm sending in the androgel in accordance with the agreement we made at our recent appointment.  Start working out regularly after you start using it, for maximum benefit. Make sure you have follow up appointment in a month or so to go over how its working, recheck the levels, and maybe modify the dose.

## 2022-07-24 NOTE — Addendum Note (Signed)
Addended by: Lula Olszewski on: 07/24/2022 03:11 PM   Modules accepted: Orders

## 2022-07-24 NOTE — Telephone Encounter (Signed)
Spoke with patient and he confirmed that he has received all requested medications.

## 2022-07-25 ENCOUNTER — Telehealth: Payer: Self-pay | Admitting: Pharmacist

## 2022-07-25 ENCOUNTER — Ambulatory Visit (INDEPENDENT_AMBULATORY_CARE_PROVIDER_SITE_OTHER): Payer: BLUE CROSS/BLUE SHIELD | Admitting: Pharmacist

## 2022-07-25 ENCOUNTER — Encounter: Payer: Self-pay | Admitting: Pharmacist

## 2022-07-25 ENCOUNTER — Other Ambulatory Visit: Payer: Self-pay

## 2022-07-25 DIAGNOSIS — B182 Chronic viral hepatitis C: Secondary | ICD-10-CM

## 2022-07-25 NOTE — Progress Notes (Signed)
      [address]       [place] [address]             [phone]        [place] [facility] Medical Record #[id number] Date of Birth: 1990-09-11  Referring: Colvin Caroli, * Primary Care: Lula Olszewski, MD Primary Cardiologist:Mahesh Richardean Chimera, MD  Reason for visit:   follow-up  History of Present Illness:     31 year old male presents in follow-up.  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  Overall he is doing well.  He denies any shortness of breath, lower extremity or abdominal pain.  Physical Exam: BP 121/78 (BP Location: Left Arm, Patient Position: Sitting, Cuff Size: Large)   Pulse 84   Resp 20   Ht 5\' 10"  (1.778 m)   Wt 207 lb (93.9 kg)   SpO2 97% Comment: RA  BMI 29.70 kg/m   Alert NAD Incision well-healed.  Sternum stable Abdomen, ND No peripheral edema   Diagnostic Studies & Laboratory data: Echo: IMPRESSIONS     1. Left ventricular ejection fraction, by estimation, is 60 to 65%. Left  ventricular ejection fraction by 3D volume is 60 %. The left ventricle has  normal function. The left ventricle has no regional wall motion  abnormalities. Left ventricular diastolic   parameters were normal. The average left ventricular global longitudinal  strain is -21.6 %. The global longitudinal strain is normal.   2. Right ventricular systolic function is normal. The right ventricular  size is normal. There is normal pulmonary artery systolic pressure. The  estimated right ventricular systolic pressure is 20.6 mmHg.   3. The mitral valve is normal in structure. No evidence of mitral valve  regurgitation. No evidence of mitral stenosis.   4. Well seated tricuspid annuloplasty ring. Mean gradient 1.5 mm Hg at 66  bpm. Mild tricuspid insufficiency. The tricuspid valve is status post  repair with an annuloplasty ring.   5. The aortic valve is normal in structure. Aortic valve regurgitation is  not  visualized. No aortic stenosis is present.   6. The inferior vena cava is normal in size with greater than 50%  respiratory variability, suggesting right atrial pressure of 3 mmHg.     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  Overall he is doing quite well.  He is no longer on Coumadin.  He will continue to follow-up with cardiology.  He is cleared from surgical standpoint.  He will follow-up as needed.   04/25/22 07/26/2022 12:21 PM

## 2022-07-25 NOTE — Telephone Encounter (Signed)
Patient picked up medication from clinic today and stated he did not have time for counseling with pharmacist as I was away working with research at the time. Called to review Mavyret counseling points; no answer and VM box full. Will send MyChart message today. Already has follow up with Dr. Earlene Plater.  Margarite Gouge, PharmD, CPP, BCIDP, AAHIVP Clinical Pharmacist Practitioner Infectious Diseases Clinical Pharmacist [facility]

## 2022-07-26 ENCOUNTER — Encounter: Payer: Self-pay | Admitting: Thoracic Surgery (Cardiothoracic Vascular Surgery)

## 2022-07-26 ENCOUNTER — Ambulatory Visit (INDEPENDENT_AMBULATORY_CARE_PROVIDER_SITE_OTHER): Payer: BLUE CROSS/BLUE SHIELD | Admitting: Thoracic Surgery (Cardiothoracic Vascular Surgery)

## 2022-07-26 VITALS — BP 121/78 | HR 84 | Resp 20 | Ht 70.0 in | Wt 207.0 lb

## 2022-07-26 DIAGNOSIS — Z952 Presence of prosthetic heart valve: Secondary | ICD-10-CM

## 2022-08-01 NOTE — Telephone Encounter (Signed)
Pt returned a call to our office regarding lab results. Would like a call back ?

## 2022-08-02 NOTE — Telephone Encounter (Signed)
Already informed patient of lab results/notes.

## 2022-08-02 NOTE — Telephone Encounter (Signed)
Patient returned call. Patient requests to be called at ph# [phone]  to be given lab results

## 2022-08-06 ENCOUNTER — Telehealth: Payer: Self-pay | Admitting: Internal Medicine

## 2022-08-06 NOTE — Telephone Encounter (Signed)
Patient states: - Pharmacy informed him that Testosterone (ANDROGEL) 20.25 MG/1.25GM (1.62%) GEL  needs a prior authorization  Patient requests: - A prior authorization be started on medication as soon as possible

## 2022-08-08 ENCOUNTER — Encounter: Payer: Self-pay | Admitting: Pulmonary Disease

## 2022-08-12 NOTE — Progress Notes (Signed)
error 

## 2022-08-12 NOTE — Telephone Encounter (Signed)
Sent PA for this today.

## 2022-08-14 ENCOUNTER — Other Ambulatory Visit (HOSPITAL_COMMUNITY): Payer: Self-pay

## 2022-08-19 ENCOUNTER — Other Ambulatory Visit (HOSPITAL_COMMUNITY): Payer: Self-pay

## 2022-08-21 ENCOUNTER — Ambulatory Visit (INDEPENDENT_AMBULATORY_CARE_PROVIDER_SITE_OTHER): Payer: BLUE CROSS/BLUE SHIELD | Admitting: Internal Medicine

## 2022-08-21 ENCOUNTER — Other Ambulatory Visit: Payer: Self-pay

## 2022-08-21 ENCOUNTER — Encounter: Payer: Self-pay | Admitting: Internal Medicine

## 2022-08-21 VITALS — BP 125/85 | HR 71 | Temp 98.1°F | Ht 70.0 in | Wt 200.0 lb

## 2022-08-21 DIAGNOSIS — B182 Chronic viral hepatitis C: Secondary | ICD-10-CM

## 2022-08-21 NOTE — Progress Notes (Signed)
[facility]  CHIEF COMPLAINT:    Follow up for hepatitis C  SUBJECTIVE:    Reginald Clarke is a 32 y.o. male with PMHx as below who presents to the clinic for hepatitis C follow up.   Patient is here today for hepatitis C follow up after starting Mavyret on 07/25/22.  He is tolerating well with no side effects and no missed doses.  He starts his 2nd box of medication tomorrow and already has it on hand.   Treatment: Mavyret x 8 weeks Start date: 07/25/22 Pre-treatment HCV RNA: 7,150,000 copies Genotype: 2 Fibrosis score: F0 HIV: negative Hepatitis A: immune Hepatitis B: immune    Please see A&P for the details of today's visit and status of the patient's medical problems.   Patient's Medications  New Prescriptions   No medications on file  Previous Medications   ACETAMINOPHEN (TYLENOL) 650 MG CR TABLET    Take 650 mg by mouth every 8 (eight) hours as needed for pain.   ASCORBIC ACID (VITAMIN C) 500 MG TABLET    Take 1 tablet (500 mg total) by mouth 2 (two) times daily with a meal.   CYANOCOBALAMIN 1000 MCG TABLET    Take 1 tablet (1,000 mcg total) by mouth 2 (two) times daily with a meal.   ESCITALOPRAM (LEXAPRO) 20 MG TABLET    Take 1 tablet (20 mg total) by mouth daily.   FERROUS FUMARATE (HEMOCYTE - 106 MG FE) 324 (106 FE) MG TABS TABLET    Take 1 tablet (106 mg of iron total) by mouth daily.   FOLIC ACID (FOLVITE) 1 MG TABLET    Take 1 tablet (1 mg total) by mouth 2 (two) times daily with a meal.   GABAPENTIN (NEURONTIN) 300 MG CAPSULE    Take 1 capsule (300 mg total) by mouth 3 (three) times daily.   GLECAPREVIR-PIBRENTASVIR (MAVYRET) 100-40 MG TABS    Take 3 tablets by mouth daily with breakfast.   METHADONE HCL PO    Take 100 mg by mouth daily.   METHOCARBAMOL (ROBAXIN) 500 MG TABLET    Take 1 tablet (500 mg total) by mouth every 8 (eight) hours as needed for muscle spasms.   ONDANSETRON (ZOFRAN) 4 MG TABLET    Take 1 tablet (4 mg  total) by mouth every 8 (eight) hours as needed.   TADALAFIL (CIALIS) 20 MG TABLET    Take 0.5-1 tablets (10-20 mg total) by mouth every other day as needed for erectile dysfunction.   TESTOSTERONE (ANDROGEL) 20.25 MG/1.25GM (1.62%) GEL    Place 40.5 mg onto the skin daily at 6 (six) AM. Apply after showering as directed Do not allow for contact with male kids or pregnant women   WARFARIN (COUMADIN) 4 MG TABLET    Take 1 tablet by mouth on Sunday, Tuesday, Thursday, Saturday or as directed by Anticoagulation Clinic   WARFARIN (COUMADIN) 6 MG TABLET    Take one tablet by mouth on Monday, Wednesday, Friday or as directed by Anticoagulation Clinic  Modified Medications   No medications on file  Discontinued Medications   No medications on file      Past Medical History:  Diagnosis Date   Anxiety    Endocarditis    Hepatitis    hx of hep C per pt   Opiate dependence ([name])    on methadone   Smoking 12/25/2021    Social History   Tobacco Use   Smoking  status: Former    Packs/day: 1.00    Years: 19.00    Total pack years: 19.00    Types: Cigarettes    Quit date: 12/20/2021    Years since quitting: 0.6   Smokeless tobacco: Never   Tobacco comments:    Currently 5 cigs/day  Vaping Use   Vaping Use: Never used  Substance Use Topics   Alcohol use: No   Drug use: Yes    Types: Methamphetamines, Marijuana, Heroin    Comment: last use in April - meth; no heroin in 3 years; occasional marijuana    No family history on file.  No Known Allergies  Review of Systems  Constitutional: Negative.   Gastrointestinal: Negative.      OBJECTIVE:    Vitals:   08/21/22 1035  BP: 125/85  Pulse: 71  Temp: 98.1 F (36.7 C)  TempSrc: Temporal  SpO2: 97%  Weight: 200 lb (90.7 kg)  Height: 5\' 10"  (1.778 m)   Body mass index is 28.7 kg/m.  Physical Exam Constitutional:      Appearance: Normal appearance.  HENT:     Head: Normocephalic and atraumatic.  Eyes:     Extraocular  Movements: Extraocular movements intact.     Conjunctiva/sclera: Conjunctivae normal.  Abdominal:     General: There is no distension.     Palpations: Abdomen is soft.  Musculoskeletal:        General: Normal range of motion.  Skin:    General: Skin is warm and dry.  Neurological:     General: No focal deficit present.     Mental Status: He is alert and oriented to person, place, and time.  Psychiatric:        Mood and Affect: Mood normal.        Behavior: Behavior normal.      Labs and Microbiology:    Latest Ref Rng & Units 07/05/2022   11:03 AM 06/03/2022    2:33 AM 05/27/2022    4:52 AM  CBC  WBC 3.8 - 10.8 Thousand/uL 5.0  4.9  4.7   Hemoglobin 13.2 - 17.1 g/dL 12.7  10.6  10.2   Hematocrit 38.5 - 50.0 % 39.3  34.5  32.5   Platelets 140 - 400 Thousand/uL 211  169  176       Latest Ref Rng & Units 07/05/2022   11:03 AM 06/03/2022    2:33 AM 05/27/2022    4:52 AM  CMP  Glucose 70 - 99 mg/dL  102  90   BUN 6 - 20 mg/dL  19  16   Creatinine 0.61 - 1.24 mg/dL  0.76  0.79   Sodium 135 - 145 mmol/L  138  140   Potassium 3.5 - 5.1 mmol/L  4.0  4.2   Chloride 98 - 111 mmol/L  101  102   CO2 22 - 32 mmol/L  27  29   Calcium 8.9 - 10.3 mg/dL  9.5  9.5   Total Protein 6.1 - 8.1 g/dL 7.6  6.7  6.8   Total Bilirubin 0.2 - 1.2 mg/dL 0.4  0.4  0.5   Alkaline Phos 38 - 126 U/L  81  84   AST 10 - 40 U/L 27  27  30    ALT 9 - 46 U/L 9 - 46 U/L 33    31  26  29       No results found for this or any previous visit (from the past 240 hour(s)).  Imaging:  ASSESSMENT & PLAN:    Hepatitis C, chronic ([place]) Patient doing quite well and no issues.  Stressed importance of continued adherence to his [place].  Will repeat LFTs and HCV RNA today.  Follow up in 4 weeks at end of therapy and then again 3 months after that.  He has a pre-treatment score of F0 so will not need ongoing liver monitoring following treatment.    Orders Placed This Encounter  Procedures   Hepatic  function panel   Hepatitis C RNA quantitative       [facility] 08/21/2022, 10:54 AM

## 2022-08-21 NOTE — Assessment & Plan Note (Signed)
Patient doing quite well and no issues.  Stressed importance of continued adherence to his [place].  Will repeat LFTs and HCV RNA today.  Follow up in 4 weeks at end of therapy and then again 3 months after that.  He has a pre-treatment score of F0 so will not need ongoing liver monitoring following treatment.

## 2022-08-22 LAB — HEPATIC FUNCTION PANEL
AG Ratio: 1.6 (calc) (ref 1.0–2.5)
ALT: 13 U/L (ref 9–46)
AST: 17 U/L (ref 10–40)
Albumin: 4.6 g/dL (ref 3.6–5.1)
Alkaline phosphatase (APISO): 106 U/L (ref 36–130)
Bilirubin, Direct: 0.1 mg/dL (ref 0.0–0.2)
Globulin: 2.9 g/dL (calc) (ref 1.9–3.7)
Indirect Bilirubin: 0.3 mg/dL (calc) (ref 0.2–1.2)
Total Bilirubin: 0.4 mg/dL (ref 0.2–1.2)
Total Protein: 7.5 g/dL (ref 6.1–8.1)

## 2022-08-22 LAB — HEPATITIS C RNA QUANTITATIVE
HCV Quantitative Log: <1.18 log IU/mL
HCV RNA, PCR, QN: <15 IU/mL

## 2022-08-23 ENCOUNTER — Telehealth: Payer: Self-pay

## 2022-08-23 NOTE — Telephone Encounter (Signed)
Spoke with Reginald Clarke, relayed that HCV viral load is undetectable, indicating that his medication is working well. Advised him to continue with Mavyret as prescribed and reminded him of follow up in February.   Patient verbalized understanding and has no further questions.   Beryle Flock, RN

## 2022-08-23 NOTE — Telephone Encounter (Signed)
-----  Message from Mignon Pine, DO sent at 08/23/2022  6:28 AM EST ----- Please let patient know that labs look great.  His HCV viral load is not detectable mid-way through treatment course.  He should continue taking Mavyret daily and follow up as scheduled.   Thanks, Mitzi Hansen

## 2022-09-03 ENCOUNTER — Encounter: Payer: Self-pay | Admitting: Pulmonary Disease

## 2022-09-06 ENCOUNTER — Other Ambulatory Visit (HOSPITAL_COMMUNITY): Payer: Self-pay

## 2022-09-18 ENCOUNTER — Ambulatory Visit: Payer: BLUE CROSS/BLUE SHIELD | Admitting: Internal Medicine

## 2022-09-18 NOTE — Progress Notes (Deleted)
[facility]  CHIEF COMPLAINT:    Follow up for hepatitis C  SUBJECTIVE:    Reginald Clarke is a 32 y.o. male with PMHx as below who presents to the clinic for hepatitis C.   Patient is here today for hepatitis C follow up after starting [place] on 07/25/22.  He tolerated the medication well with no side effects and no missed doses.  He has now completed the 8 weeks of treatment and his HCV RNA was undetectable when checked last month.   Treatment: Mavyret x 8 weeks Start date: 07/25/22 Pre-treatment HCV RNA: 7,150,000 copies Genotype: 2 Fibrosis score: F0 HIV: negative Hepatitis A: immune Hepatitis B: immune   Please see A&P for the details of today's visit and status of the patient's medical problems.   Patient's Medications  New Prescriptions   No medications on file  Previous Medications   ACETAMINOPHEN (TYLENOL) 650 MG CR TABLET    Take 650 mg by mouth every 8 (eight) hours as needed for pain.   ASCORBIC ACID (VITAMIN C) 500 MG TABLET    Take 1 tablet (500 mg total) by mouth 2 (two) times daily with a meal.   CYANOCOBALAMIN 1000 MCG TABLET    Take 1 tablet (1,000 mcg total) by mouth 2 (two) times daily with a meal.   ESCITALOPRAM (LEXAPRO) 20 MG TABLET    Take 1 tablet (20 mg total) by mouth daily.   FERROUS FUMARATE (HEMOCYTE - 106 MG FE) 324 (106 FE) MG TABS TABLET    Take 1 tablet (106 mg of iron total) by mouth daily.   FOLIC ACID (FOLVITE) 1 MG TABLET    Take 1 tablet (1 mg total) by mouth 2 (two) times daily with a meal.   GABAPENTIN (NEURONTIN) 300 MG CAPSULE    Take 1 capsule (300 mg total) by mouth 3 (three) times daily.   GLECAPREVIR-PIBRENTASVIR (MAVYRET) 100-40 MG TABS    Take 3 tablets by mouth daily with breakfast.   METHADONE HCL PO    Take 100 mg by mouth daily.   METHOCARBAMOL (ROBAXIN) 500 MG TABLET    Take 1 tablet (500 mg total) by mouth every 8 (eight) hours as needed for muscle spasms.   ONDANSETRON (ZOFRAN) 4 MG  TABLET    Take 1 tablet (4 mg total) by mouth every 8 (eight) hours as needed.   TADALAFIL (CIALIS) 20 MG TABLET    Take 0.5-1 tablets (10-20 mg total) by mouth every other day as needed for erectile dysfunction.   TESTOSTERONE (ANDROGEL) 20.25 MG/1.25GM (1.62%) GEL    Place 40.5 mg onto the skin daily at 6 (six) AM. Apply after showering as directed Do not allow for contact with male kids or pregnant women  Modified Medications   No medications on file  Discontinued Medications   No medications on file      Past Medical History:  Diagnosis Date   Anxiety    Endocarditis    Hepatitis    hx of hep C per pt   Opiate dependence ([place])    on methadone   Smoking 12/25/2021    Social History   Tobacco Use   Smoking status: Former    Packs/day: 1.00    Years: 19.00    Total pack years: 19.00    Types: Cigarettes    Quit date: 12/20/2021    Years since quitting: 0.7   Smokeless tobacco: Never   Tobacco comments:  Currently 5 cigs/day  Vaping Use   Vaping Use: Never used  Substance Use Topics   Alcohol use: No   Drug use: Yes    Types: Methamphetamines, Marijuana, Heroin    Comment: last use in April - meth; no heroin in 3 years; occasional marijuana    No family history on file.  No Known Allergies  ROS   OBJECTIVE:    There were no vitals filed for this visit. There is no height or weight on file to calculate BMI.  Physical Exam   Labs and Microbiology:    Latest Ref Rng & Units 07/05/2022   11:03 AM 06/03/2022    2:33 AM 05/27/2022    4:52 AM  CBC  WBC 3.8 - 10.8 Thousand/uL 5.0  4.9  4.7   Hemoglobin 13.2 - 17.1 g/dL 12.7  10.6  10.2   Hematocrit 38.5 - 50.0 % 39.3  34.5  32.5   Platelets 140 - 400 Thousand/uL 211  169  176       Latest Ref Rng & Units 08/21/2022   10:57 AM 07/05/2022   11:03 AM 06/03/2022    2:33 AM  CMP  Glucose 70 - 99 mg/dL   102   BUN 6 - 20 mg/dL   19   Creatinine 0.61 - 1.24 mg/dL   0.76   Sodium 135 - 145 mmol/L   138    Potassium 3.5 - 5.1 mmol/L   4.0   Chloride 98 - 111 mmol/L   101   CO2 22 - 32 mmol/L   27   Calcium 8.9 - 10.3 mg/dL   9.5   Total Protein 6.1 - 8.1 g/dL 7.5  7.6  6.7   Total Bilirubin 0.2 - 1.2 mg/dL 0.4  0.4  0.4   Alkaline Phos 38 - 126 U/L   81   AST 10 - 40 U/L 17  27  27   $ ALT 9 - 46 U/L 13  33    31  26         ASSESSMENT & PLAN:    No problem-specific Assessment & Plan notes found for this encounter.   No orders of the defined types were placed in this encounter.    Patient completed his Mavyret x 8 weeks with no issues.  Will repeat RNA again today and 12 weeks for cure visit.  He has a pre-treatment score of F0 so will not need ongoing liver monitoring following treatment.    [facility] 09/18/2022, 5:07 AM

## 2022-09-20 ENCOUNTER — Ambulatory Visit: Payer: BLUE CROSS/BLUE SHIELD | Admitting: Internal Medicine

## 2022-09-24 ENCOUNTER — Other Ambulatory Visit (HOSPITAL_COMMUNITY): Payer: Self-pay

## 2022-09-26 ENCOUNTER — Telehealth: Payer: Self-pay | Admitting: Internal Medicine

## 2022-09-26 ENCOUNTER — Encounter: Payer: Self-pay | Admitting: Internal Medicine

## 2022-09-26 ENCOUNTER — Ambulatory Visit (INDEPENDENT_AMBULATORY_CARE_PROVIDER_SITE_OTHER): Payer: BLUE CROSS/BLUE SHIELD | Admitting: Internal Medicine

## 2022-09-26 VITALS — BP 110/78 | HR 70 | Temp 98.0°F | Ht 70.0 in | Wt 202.8 lb

## 2022-09-26 DIAGNOSIS — R9431 Abnormal electrocardiogram [ECG] [EKG]: Secondary | ICD-10-CM

## 2022-09-26 DIAGNOSIS — R7989 Other specified abnormal findings of blood chemistry: Secondary | ICD-10-CM

## 2022-09-26 DIAGNOSIS — G894 Chronic pain syndrome: Secondary | ICD-10-CM

## 2022-09-26 DIAGNOSIS — E639 Nutritional deficiency, unspecified: Secondary | ICD-10-CM

## 2022-09-26 DIAGNOSIS — Z79899 Other long term (current) drug therapy: Secondary | ICD-10-CM

## 2022-09-26 DIAGNOSIS — N529 Male erectile dysfunction, unspecified: Secondary | ICD-10-CM | POA: Diagnosis not present

## 2022-09-26 DIAGNOSIS — N528 Other male erectile dysfunction: Secondary | ICD-10-CM

## 2022-09-26 DIAGNOSIS — J15212 Pneumonia due to Methicillin resistant Staphylococcus aureus: Secondary | ICD-10-CM

## 2022-09-26 DIAGNOSIS — K219 Gastro-esophageal reflux disease without esophagitis: Secondary | ICD-10-CM

## 2022-09-26 DIAGNOSIS — F419 Anxiety disorder, unspecified: Secondary | ICD-10-CM

## 2022-09-26 DIAGNOSIS — D649 Anemia, unspecified: Secondary | ICD-10-CM

## 2022-09-26 LAB — COMPREHENSIVE METABOLIC PANEL
ALT: 16 U/L (ref 0–53)
AST: 21 U/L (ref 0–37)
Albumin: 4.9 g/dL (ref 3.5–5.2)
Alkaline Phosphatase: 110 U/L (ref 39–117)
BUN: 12 mg/dL (ref 6–23)
CO2: 27 mEq/L (ref 19–32)
Calcium: 10.2 mg/dL (ref 8.4–10.5)
Chloride: 101 mEq/L (ref 96–112)
Creatinine, Ser: 0.76 mg/dL (ref 0.40–1.50)
GFR: 119.62 mL/min (ref >60.00)
Glucose, Bld: 80 mg/dL (ref 70–99)
Potassium: 4.7 mEq/L (ref 3.5–5.1)
Sodium: 138 mEq/L (ref 135–145)
Total Bilirubin: 0.5 mg/dL (ref 0.2–1.2)
Total Protein: 7.7 g/dL (ref 6.0–8.3)

## 2022-09-26 LAB — B12 AND FOLATE PANEL
Folate: >23.9 ng/mL (ref >5.9)
Vitamin B-12: 1116 pg/mL — ABNORMAL HIGH (ref 211–911)

## 2022-09-26 LAB — CBC WITH DIFFERENTIAL/PLATELET
Basophils Absolute: 0 10*3/uL (ref 0.0–0.1)
Basophils Relative: 0.6 % (ref 0.0–3.0)
Eosinophils Absolute: 0.1 10*3/uL (ref 0.0–0.7)
Eosinophils Relative: 2.3 % (ref 0.0–5.0)
HCT: 42.6 % (ref 39.0–52.0)
Hemoglobin: 14.5 g/dL (ref 13.0–17.0)
Lymphocytes Relative: 41 % (ref 12.0–46.0)
Lymphs Abs: 2.5 10*3/uL (ref 0.7–4.0)
MCHC: 34 g/dL (ref 30.0–36.0)
MCV: 86.1 fl (ref 78.0–100.0)
Monocytes Absolute: 0.4 10*3/uL (ref 0.1–1.0)
Monocytes Relative: 7.3 % (ref 3.0–12.0)
Neutro Abs: 2.9 10*3/uL (ref 1.4–7.7)
Neutrophils Relative %: 48.8 % (ref 43.0–77.0)
Platelets: 203 10*3/uL (ref 150.0–400.0)
RBC: 4.95 Mil/uL (ref 4.22–5.81)
RDW: 16.2 % — ABNORMAL HIGH (ref 11.5–15.5)
WBC: 6 10*3/uL (ref 4.0–10.5)

## 2022-09-26 LAB — TESTOSTERONE: Testosterone: 142.31 ng/dL — ABNORMAL LOW (ref 300.00–890.00)

## 2022-09-26 LAB — TSH: TSH: 2.51 u[IU]/mL (ref 0.35–5.50)

## 2022-09-26 LAB — MAGNESIUM: Magnesium: 1.9 mg/dL (ref 1.5–2.5)

## 2022-09-26 LAB — FERRITIN: Ferritin: 83.5 ng/mL (ref 22.0–322.0)

## 2022-09-26 MED ORDER — NEPHRO-VITE RX 1 MG PO TABS: 1.0000 | ORAL_TABLET | Freq: Every day | ORAL | 3 refills | Status: DC

## 2022-09-26 MED ORDER — TADALAFIL 20 MG PO TABS: 10.0000 mg | ORAL_TABLET | ORAL | 3 refills | Status: AC | PRN

## 2022-09-26 MED ORDER — GABAPENTIN 300 MG PO CAPS: 300.0000 mg | ORAL_CAPSULE | Freq: Three times a day (TID) | ORAL | 3 refills | Status: AC

## 2022-09-26 MED ORDER — ESCITALOPRAM OXALATE 20 MG PO TABS: 20.0000 mg | ORAL_TABLET | Freq: Every day | ORAL | 3 refills | Status: AC

## 2022-09-26 MED ORDER — TESTOSTERONE 1.62 % TD GEL: TRANSDERMAL | 1 refills | Status: AC

## 2022-09-26 NOTE — Assessment & Plan Note (Signed)
Indication for controlled substance: low testosterone, opioid use disorder  Medication and dose: see associated problem and med list # pills per month: we use gel testosterone here Last UDS date: 07/22/22 Controlled substance contract signed (Y/N): Yes, 07/22/22 PDMP last reviewed (include red flags): PDMP reviewed during this encounter.  Diversion Prevention:  warned of risk of discontinuation with legal or substance use issues, signed contract, intermittent random drug screens and pill counts   Interim history: testosterone helping but still minimal libido. He is using regularly

## 2022-09-26 NOTE — Telephone Encounter (Signed)
Caller states: - In order to have the B complex and Folic acid 1 mg filled, it would need to be separated when it comes to the rx order - PCP would need to clarify exactly how much vitamin c should be included   For more info, Gabriel Cirri can be contacted at [phone]

## 2022-09-26 NOTE — Assessment & Plan Note (Signed)
Advised to stop Zofran Considering switching Lexapro - but he prefers we leave it alone so we don't destabilize.

## 2022-09-26 NOTE — Patient Instructions (Addendum)
Avoid Zofran unless absolutely necessary Don't sudden stop Lexapro... but if heart rate bouncing around let me know.  Please update me if any slip ups or if mood worsening- I want to try to prevent any descent into darkness.  Get [place] much therapy as you can from your methadone program.  If you want more I can refer you to behavioral health counselor elsewhere.  Don't stop pushing until you are in a great emotional place- Great job sticking to all the medical plans so far.

## 2022-09-26 NOTE — Progress Notes (Signed)
[facility]: [id number]   Routine Medical Office Visit  Patient:  Reginald Clarke      Age: 32 y.o.       Sex:  male  Date:   09/26/2022  PCP:    Loralee Pacas, [place] Provider: Loralee Pacas, MD   Problem Focused Charting:   Medical Decision Making per Assessment/Plan   Reginald was seen today for 3 month follow-up and medication refill.  Prolonged QT interval Overview: Associated with methadone/Lexapro/zofran  Assessment & Plan: Advised to stop Zofran Considering switching Lexapro - but he prefers we leave it alone so we don't destabilize.  Orders: -     Nephro-Vite Rx; Take 1 tablet by mouth daily with breakfast.  Dispense: 90 tablet; Refill: 3 -     CBC with Differential/Platelet -     Comprehensive metabolic panel -     Magnesium -     B12 and Folate Panel -     Ferritin -     TSH -     EKG 12-Lead  Other male erectile dysfunction Overview: Patient reports severe erectile dysfunction associated with methadone treatment and on Lexapro.  I strongly suspect this is due to testosterone suppression by methadone and Lexapro's effect on libido and orgasm function.    Orders: -     Tadalafil; Take 0.5-1 tablets (10-20 mg total) by mouth every other day as needed for erectile dysfunction.  Dispense: 90 tablet; Refill: 3 -     Testosterone  Erectile dysfunction, unspecified erectile dysfunction type Overview: Patient reports severe erectile dysfunction associated with methadone treatment and on Lexapro.  I strongly suspect this is due to testosterone suppression by methadone and Lexapro's effect on libido and orgasm function.    Orders: -     Tadalafil; Take 0.5-1 tablets (10-20 mg total) by mouth every other day as needed for erectile dysfunction.  Dispense: 90 tablet; Refill: 3  Nutritional deficiency -     Nephro-Vite Rx; Take 1 tablet by mouth daily with breakfast.  Dispense: 90 tablet; Refill: 3  Normocytic  anemia Overview:   Lab Results  Component Value Date/Time   HGB 10.6 (L) 06/03/2022 02:33 AM   HGB 10.2 (L) 05/27/2022 04:52 AM   HGB 9.5 (L) 05/20/2022 03:42 AM   HGB 8.8 (L) 05/15/2022 03:40 AM   HGB 8.5 (L) 05/11/2022 03:47 AM       Pneumonia of both lower lobes due to methicillin resistant Staphylococcus aureus (MRSA) ([place])  Gastroesophageal reflux disease, unspecified whether esophagitis present Overview: Taking protonix since hospitalized but believes he stopped   Chronic pain syndrome/methadone dependency -     Nephro-Vite Rx; Take 1 tablet by mouth daily with breakfast.  Dispense: 90 tablet; Refill: 3  Anxiety disorder, unspecified type -     Gabapentin; Take 1 capsule (300 mg total) by mouth 3 (three) times daily.  Dispense: 90 capsule; Refill: 3 -     Escitalopram Oxalate; Take 1 tablet (20 mg total) by mouth daily.  Dispense: 90 tablet; Refill: 3 -     TSH  Low testosterone -     Testosterone; SMARTSIG:2 Pump Topical Every Morning  Dispense: 75 g; Refill: 1 -     Testosterone  High risk medication use Overview: Methadone, testosterone controlled substance(s)    Assessment & Plan: Indication for controlled substance: low testosterone, opioid use disorder  Medication and dose: see associated problem and med list # pills per month: we use gel testosterone here  Last UDS date: 07/22/22 Controlled substance contract signed (Y/N): Yes, 07/22/22 PDMP last reviewed (include red flags): PDMP reviewed during this encounter.  Diversion Prevention:  warned of risk of discontinuation with legal or substance use issues, signed contract, intermittent random drug screens and pill counts   Interim history: testosterone helping but still minimal libido. He is using regularly        Due to history qt prolongation and heart valve repair, on methadone and Lexapro which prolong qt, we did EKG today.  Today's key discussion points and After Visit Summary (AVS) reminders.  Common side effects, risks, benefits, and alternatives for medications and treatment plan prescribed today were discussed Medication list was reconciled and patient instructions and summary information was documented and made available for him to review in the AVS (see AVS). Review of this encounter document for accuracy and understanding is encouraged in the AVS. Follow up recommended:  Return in about 3 months (around 12/25/2022) for chronic disease monitoring and management.    Subjective - Clinical Presentation:   Reginald Schumacher is a 32 y.o. 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   Following with infectious disease    Opiate dependence ([place])    on methadone   Pneumonia of both lower lobes due to methicillin resistant Staphylococcus aureus (MRSA) ([place])    Smoking 12/25/2021    Outpatient Medications Prior to Visit  Medication Sig   [DISCONTINUED] acetaminophen (TYLENOL) 650 MG CR tablet Take 650 mg by mouth every 8 (eight) hours as needed for pain.   [DISCONTINUED] ascorbic acid (VITAMIN C) 500 MG tablet  Take 1 tablet (500 mg total) by mouth 2 (two) times daily with a meal.   [DISCONTINUED] ascorbic acid (VITAMIN C) 500 MG tablet Take 500 mg by mouth daily.   [DISCONTINUED] cyanocobalamin (VITAMIN B12) 1000 MCG tablet Take 1,000 mcg by mouth daily.   [DISCONTINUED] cyanocobalamin 1000 MCG tablet Take 1 tablet (1,000 mcg total) by mouth 2 (two) times daily with a meal.   [DISCONTINUED] escitalopram (LEXAPRO) 20 MG tablet Take 1 tablet (20 mg total) by mouth daily.   [DISCONTINUED] Ferrous Fumarate (HEMOCYTE - 106 MG FE) 324 (106 Fe) MG TABS tablet Take 1 tablet (106 mg of iron total) by mouth daily.   [DISCONTINUED] folic acid (FOLVITE) 1 MG tablet Take 1 tablet (1 mg total) by mouth 2 (two) times daily with a meal.   [DISCONTINUED] gabapentin (NEURONTIN) 300 MG capsule Take 1 capsule (300 mg total) by mouth 3 (three) times daily.   [DISCONTINUED] Glecaprevir-Pibrentasvir (MAVYRET) 100-40  MG TABS Take 3 tablets by mouth daily with breakfast.   [DISCONTINUED] METHADONE HCL PO Take 100 mg by mouth daily.   [DISCONTINUED] methocarbamol (ROBAXIN) 500 MG tablet Take 1 tablet (500 mg total) by mouth every 8 (eight) hours as needed for muscle spasms.   [DISCONTINUED] ondansetron (ZOFRAN) 4 MG tablet Take 1 tablet (4 mg total) by mouth every 8 (eight) hours as needed.   [DISCONTINUED] tadalafil (CIALIS) 20 MG tablet Take 0.5-1 tablets (10-20 mg total) by mouth every other day as needed for erectile dysfunction.   [DISCONTINUED] Testosterone (ANDROGEL) 20.25 MG/1.25GM (1.62%) GEL Place 40.5 mg onto the skin daily at 6 (six) AM. Apply after showering as directed Do not allow for contact with male kids or pregnant women   [DISCONTINUED] Testosterone 1.62 % GEL SMARTSIG:2 Pump Topical Every Morning   No facility-administered medications prior to visit.    Chief Complaint  Patient presents with   3 month follow-up   Medication Refill    All medications except Lexapro.    HPI  Patient reports doing  great Wants refills all medications Testosterone/Cialis helping Gabapentin he using for anxiety, not  pain Lexapro really helps and he prefers not change although it prolongs qt. Finished and cured of hepatitis C with treatment Patient reports Still taking methadone / staying sober         Objective:  Physical Exam  BP 110/78 (BP Location: Left Arm, Patient Position: Sitting)   Pulse 70   Temp 98 F (36.7 C) (Temporal)   Ht 5' 10"$  (1.778 m)   Wt 202 lb 12.8 oz (92 kg)   SpO2 98%   BMI 29.10 kg/m   Overweight  by BMI criteria but truncal adiposity (waist circumference or caliper) should be used instead. Wt Readings from Last 10 Encounters:  09/26/22 202 lb 12.8 oz (92 kg)  08/21/22 200 lb (90.7 kg)  07/26/22 207 lb (93.9 kg)  07/22/22 203 lb 3.2 oz (92.2 kg)  07/18/22 200 lb (90.7 kg)  07/05/22 206 lb (93.4 kg)  07/03/22 206 lb (93.4 kg)  06/20/22 198 lb 12.8 oz (90.2 kg)  06/07/22 184 lb 12.8 oz (83.8 kg)  06/03/22 188 lb (85.3 kg)   Vital signs reviewed.  Nursing notes reviewed. Weight trend reviewed. General Appearance:  Well developed, well nourished male in no acute distress.   Normal work of breathing at rest Musculoskeletal: All extremities are intact.  Neurological:  Awake, alert,  No obvious focal neurological deficits or cognitive impairments Psychiatric:  Appropriate mood, pleasant demeanor Problem-specific findings:  appears well groomed and sober. Friendly, polite, alert.   Results Reviewed: No results found for any visits on 09/26/22.  Recent Results (from the past 2160 hour(s))  POCT INR     Status: Abnormal   Collection Time: 07/02/22  2:13 PM  Result Value Ref Range   INR 1.4 (A) 2.0 - 3.0   POC INR    Hepatitis B surface antibody,qualitative     Status: Abnormal   Collection Time: 07/05/22 11:03 AM  Result Value Ref Range   Hep B S Ab REACTIVE (A) NON-REACTIVE  Hepatitis B surface antigen     Status: None   Collection Time: 07/05/22 11:03 AM   Result Value Ref Range   Hepatitis B Surface Ag NON-REACTIVE NON-REACTIVE    Comment: . For additional information, please refer to  http://education.[facility].com/faq/FAQ202  (This link is being provided for informational/ educational purposes only.) .   Hepatitis B core antibody, total  Status: None   Collection Time: 07/05/22 11:03 AM  Result Value Ref Range   Hep B Core Total Ab NON-REACTIVE NON-REACTIVE    Comment: . For additional information, please refer to  http://education.[facility].com/faq/FAQ202  (This link is being provided for informational/ educational purposes only.) .   Hepatitis C genotype     Status: None   Collection Time: 07/05/22 11:03 AM  Result Value Ref Range   HCV Genotype 2     Comment: . The method used in this test is RT-PCR and reverse hybridization (Line Probe) of the 5' UTR and core region of the HCV genome. . The analytical performance characteristics of this assay have been determined by [facility], [place], [place].  The modifications have not been cleared or approved by the FDA.  This assay has been validated pursuant to the CLIA regulations and is used for clinical purposes. . For additional information, please refer to http://education.[facility].com/faq/HCVGenotyping . (This link is being provided for informational/ educational purposes only.) .   Hepatic function panel     Status: None   Collection Time: 07/05/22 11:03 AM  Result Value Ref Range   Total Protein 7.6 6.1 - 8.1 g/dL   Albumin 4.6 3.6 - 5.1 g/dL   Globulin 3.0 1.9 - 3.7 g/dL (calc)   AG Ratio 1.5 1.0 - 2.5 (calc)   Total Bilirubin 0.4 0.2 - 1.2 mg/dL   Bilirubin, Direct 0.1 0.0 - 0.2 mg/dL   Indirect Bilirubin 0.3 0.2 - 1.2 mg/dL (calc)   Alkaline phosphatase (APISO) 84 36 - 130 U/L   AST 27 10 - 40 U/L   ALT 33 9 - 46 U/L  CBC     Status: Abnormal   Collection Time: 07/05/22 11:03 AM  Result Value Ref Range    WBC 5.0 3.8 - 10.8 Thousand/uL   RBC 4.71 4.20 - 5.80 Million/uL   Hemoglobin 12.7 (L) 13.2 - 17.1 g/dL   HCT 39.3 38.5 - 50.0 %   MCV 83.4 80.0 - 100.0 fL   MCH 27.0 27.0 - 33.0 pg   MCHC 32.3 32.0 - 36.0 g/dL   RDW 16.7 (H) 11.0 - 15.0 %   Platelets 211 140 - 400 Thousand/uL   MPV 10.4 7.5 - 12.5 fL  Protime-INR     Status: Abnormal   Collection Time: 07/05/22 11:03 AM  Result Value Ref Range   INR 1.6 (H)     Comment: Reference Range                     0.9-1.1 Moderate-intensity Warfarin Therapy 2.0-3.0 Higher-intensity Warfarin Therapy   3.0-4.0  .    Prothrombin Time 16.5 (H) 9.0 - 11.5 sec    Comment: For additional information, please refer to http://education.[facility].com/faq/FAQ104 (This link is being provided for informational/ educational purposes only.)   Hepatitis C RNA quantitative     Status: Abnormal   Collection Time: 07/05/22 11:03 AM  Result Value Ref Range   HCV RNA, PCR, QN 7,150,000 (H) IU/mL   HCV Quantitative Log 6.85 (H) log IU/mL    Comment: . Reference Range:                          Not Detected       IU/mL                          Not Detected   Log IU/mL .  This test was performed using Real-Time Polymerase Chain Reaction. . Reportable range is 15 IU/mL to 100,000,000 IU/mL (1.18 Log IU/mL to 8.00 Log IU/mL). . For additional information please refer to http://education.[facility].com/faq/FAQ22v1 (This link is being provided for informational/ educational purposes only.) . The analytical performance characteristics of this assay have been determined by [facility], [place], [place].  The modifications have not been cleared or approved by the FDA.  This assay has been validated pursuant to the CLIA regulations and is used for clinical purposes. .   Liver Fibrosis, FibroTest-ActiTest     Status: Abnormal   Collection Time: 07/05/22 11:03 AM  Result Value Ref Range   Fibrosis Score 0.08    Fibrosis  Stage F0    Fibrosis Interpretation SEE NOTE     Comment: . no fibrosis . Fibro Test Score (f)  Metavir Score   f>=0 and f<=0.21 : F0 (no fibrosis) f>0.21 and f<=0.27 : F0-F1 (no fibrosis) f>0.27 and f<=0.31 : F1 (minimal fibrosis) f>0.31 and f<=0.48 : F1-F2 (minimal fibrosis) f>0.48 and f<=0.58 : F2 (moderate fibrosis) f>0.58 and f<=0.72 : F3 (advanced fibrosis) f>0.72 and f<=0.74 : F3-F4 (advanced fibrosis) f>0.74 and f<=1.00 : F4 (severe fibrosis)    Necroinflammat ACT Score 0.11    Necroinflammat Act Grade A0    Necroinflammat Interp SEE NOTE     Comment: . no activity . ActiTest Score (a)    Metavir Score   a>=0 and a<=0.17 : A0 (no activity) a>0.17 and a<=0.29 : A0-A1 (no activity) a>0.29 and a<=0.36 : A1 (minimal activity) a>0.36 and a<=0.52 : A1-A2 (minimal activity) a>0.52 and a<=0.60 : A2 (significant activity) a>0.60 and a<=0.62 : A2-A3 (significant activity) a>0.62 and a<=1.00 : A3 (severe activity)    Alpha-2-Macroglobulin 177 106 - 279 mg/dL   Haptoglobin 112 43 - 212 mg/dL   Apolipoprotein A1 180 (H) 94 - 176 mg/dL   Bilirubin 0.4 0.2 - 1.2 mg/dL   GGT 33 3 - 90 U/L   ALT 31 9 - 46 U/L   Reference ID [id number]    Footnote SEE NOTE     Comment: . The reliability of results is dependent on compliance with the preanalytical and analytical conditions recommended by [employer]. The tests have to be deferred for: acute hemolysis, acute hepatitis, acute inflammation, extra hepatic cholestasis. The advice of a specialist should be sought for interpretation in chronic hemolysis and Gilbert's syndrome. The test interpretation is not validated in liver transplant patients. Isolated extreme values of one of the components should lead to caution in interpreting the results. In case of discordance between a biopsy result and a test, it is recommended to seek the advice of a specialist. The causes of these discordances could be due to a flaw of the test or to a  flaw in the biopsy: i.e. a liver biopsy has a 33% variability rate for one fibrosis stage. FibroTest is interpretable for chronic hepatitis B and C, alcoholic and non alcoholic steatosis. ActiTest is interpretable for chronic hepatitis B and C. . The performanc e characteristics have been determined by [facility], [place]. It has not been cleared or approved by the U.S. Food and Drug Administration. Performance characteristics refer to the analytical performance of the test. . [facility], [employer], the associated logo, [employer] and all associated [employer] marks are the registered trademarks of [employer]. All third party marks - (R) and (TM) - are the property of their respective owners. (C) 2000-2014 [facility]  Incorporated. All rights reserved. Marland Kitchen   HIV Antibody (routine testing w rflx)     Status: None   Collection Time: 07/05/22 11:03 AM  Result Value Ref Range   HIV 1&2 Ab, 4th Generation NON-REACTIVE NON-REACTIVE    Comment: HIV-1 antigen and HIV-1/HIV-2 antibodies were not detected. There is no laboratory evidence of HIV infection. Marland Kitchen PLEASE NOTE: This information has been disclosed to you from records whose confidentiality may be protected by state law.  If your state requires such protection, then the state law prohibits you from making any further disclosure of the information without the specific written consent of the person to whom it pertains, or as otherwise permitted by law. A general authorization for the release of medical or other information is NOT sufficient for this purpose. . For additional information please refer to http://education.[facility].com/faq/FAQ106 (This link is being provided for informational/ educational purposes only.) . Marland Kitchen The performance of this assay has not been clinically validated in patients less than 3 years old. .   Hepatitis A antibody, total      Status: Abnormal   Collection Time: 07/05/22 11:03 AM  Result Value Ref Range   Hepatitis A AB,Total REACTIVE (A) NON-REACTIVE    Comment: . For additional information, please refer to  http://education.[facility].com/faq/FAQ202  (This link is being provided for informational/ educational purposes only.) .   POCT INR     Status: Abnormal   Collection Time: 07/11/22 11:10 AM  Result Value Ref Range   INR 1.4 (A) 2.0 - 3.0   POC INR    POCT INR     Status: None   Collection Time: 07/17/22  9:36 AM  Result Value Ref Range   INR 2.1 2.0 - 3.0   POC INR    Testosterone     Status: Abnormal   Collection Time: 07/23/22  9:06 AM  Result Value Ref Range   Testosterone 188.03 (L) 300.00 - 890.00 ng/dL  POCT INR     Status: None   Collection Time: 07/23/22  9:34 AM  Result Value Ref Range   INR 2.2 2.0 - 3.0   POC INR    Testosterone     Status: Abnormal   Collection Time: 07/24/22  8:00 AM  Result Value Ref Range   Testosterone 191.49 (L) 300.00 - 890.00 ng/dL  ECHOCARDIOGRAM COMPLETE     Status: None   Collection Time: 07/24/22  2:27 PM  Result Value Ref Range   Area-P 1/2 3.99 cm2   S' Lateral 3.40 cm  Hepatic function panel     Status: None   Collection Time: 08/21/22 10:57 AM  Result Value Ref Range   Total Protein 7.5 6.1 - 8.1 g/dL   Albumin 4.6 3.6 - 5.1 g/dL   Globulin 2.9 1.9 - 3.7 g/dL (calc)   AG Ratio 1.6 1.0 - 2.5 (calc)   Total Bilirubin 0.4 0.2 - 1.2 mg/dL   Bilirubin, Direct 0.1 0.0 - 0.2 mg/dL   Indirect Bilirubin 0.3 0.2 - 1.2 mg/dL (calc)   Alkaline phosphatase (APISO) 106 36 - 130 U/L   AST 17 10 - 40 U/L   ALT 13 9 - 46 U/L  Hepatitis C RNA quantitative     Status: None   Collection Time: 08/21/22 10:57 AM  Result Value Ref Range   HCV RNA, PCR, QN <15 IU/mL    Comment: HCV RNA Not Detected   HCV Quantitative Log <1.18 log IU/mL    Comment: HCV RNA Not Detected . Reference  Range:                          Not Detected       IU/mL                           Not Detected   Log IU/mL . This test was performed using Real-Time Polymerase Chain Reaction. . Reportable range is 15 IU/mL to 100,000,000 IU/mL (1.18 Log IU/mL to 8.00 Log IU/mL). . For additional information please refer to http://education.[facility].com/faq/FAQ22v1 (This link is being provided for informational/ educational purposes only.) . The analytical performance characteristics of this assay have been determined by [facility], [place], [place].  The modifications have not been cleared or approved by the FDA.  This assay has been validated pursuant to the CLIA regulations and is used for clinical purposes. Marland Kitchen     07/03/22 EKG reviewed and compared to today 09/26/22 repeat EKG performed due to being on multiple qt prolonging agents.     Orders placed or performed in visit on 09/26/22   EKG 12-Lead   EKG Interpretation   Date: 09/26/2022  Rate: 60  Rhythm: normal sinus rhythm  QRS Axis: normal  Intervals: normal  ST/T Wave abnormalities: nonspecific ST changes  Conduction Disutrbances:none  Narrative Interpretation:   Old EKG Reviewed: unchanged  I have personally reviewed the EKG tracing and agree with the computerized printout as noted.  I did this to evaluate safety of continuing Lexapro/methadone with history prolonged qtc- and the qtc being 416 was reassuring it is safe to continue.     Signed: Loralee Pacas, MD 09/26/2022 2:06 PM

## 2022-09-27 ENCOUNTER — Other Ambulatory Visit: Payer: Self-pay

## 2022-09-27 DIAGNOSIS — R9431 Abnormal electrocardiogram [ECG] [EKG]: Secondary | ICD-10-CM

## 2022-09-27 DIAGNOSIS — E639 Nutritional deficiency, unspecified: Secondary | ICD-10-CM

## 2022-09-27 DIAGNOSIS — D649 Anemia, unspecified: Secondary | ICD-10-CM

## 2022-09-27 MED ORDER — FOLIC ACID 1 MG PO TABS: 1.0000 mg | ORAL_TABLET | Freq: Every day | ORAL | 3 refills | Status: AC

## 2022-09-27 MED ORDER — ASCORBIC ACID 500 MG PO TABS: 500.0000 mg | ORAL_TABLET | Freq: Every day | ORAL | 3 refills | Status: AC

## 2022-09-27 MED ORDER — B COMPLEX VITAMINS PO CAPS: 1.0000 | ORAL_CAPSULE | Freq: Every day | ORAL | 3 refills | Status: AC

## 2022-09-27 NOTE — Telephone Encounter (Signed)
Sent these three prescriptions in separately to pharmacy on file.

## 2022-10-03 ENCOUNTER — Encounter: Payer: Self-pay | Admitting: Radiology

## 2022-12-26 ENCOUNTER — Ambulatory Visit: Payer: BLUE CROSS/BLUE SHIELD | Admitting: Internal Medicine

## 2023-01-02 ENCOUNTER — Ambulatory Visit: Payer: BLUE CROSS/BLUE SHIELD | Admitting: Internal Medicine

## 2023-02-14 ENCOUNTER — Ambulatory Visit: Payer: BLUE CROSS/BLUE SHIELD | Admitting: Internal Medicine

## 2023-02-28 ENCOUNTER — Ambulatory Visit: Payer: BLUE CROSS/BLUE SHIELD | Admitting: Internal Medicine

## 2023-11-28 ENCOUNTER — Ambulatory Visit: Admitting: Internal Medicine

## 2023-11-28 ENCOUNTER — Encounter: Payer: Self-pay | Admitting: Internal Medicine

## 2023-11-28 VITALS — BP 110/66 | HR 67 | Temp 98.8°F | Ht 70.0 in | Wt 190.4 lb

## 2023-11-28 DIAGNOSIS — R7989 Other specified abnormal findings of blood chemistry: Secondary | ICD-10-CM

## 2023-11-28 DIAGNOSIS — E291 Testicular hypofunction: Secondary | ICD-10-CM | POA: Diagnosis not present

## 2023-11-28 DIAGNOSIS — K2100 Gastro-esophageal reflux disease with esophagitis, without bleeding: Secondary | ICD-10-CM | POA: Diagnosis not present

## 2023-11-28 DIAGNOSIS — G894 Chronic pain syndrome: Secondary | ICD-10-CM | POA: Diagnosis not present

## 2023-11-28 MED ORDER — PANTOPRAZOLE SODIUM 40 MG PO TBEC: 40.0000 mg | DELAYED_RELEASE_TABLET | Freq: Every day | ORAL | 3 refills | Status: DC

## 2023-11-28 MED ORDER — TESTOSTERONE 20.25 MG/ACT (1.62%) TD GEL: 2.0000 | Freq: Every day | TRANSDERMAL | 5 refills | Status: AC

## 2023-11-28 NOTE — Patient Instructions (Addendum)
 VISIT SUMMARY:  Today, we discussed your ongoing management of methadone  tapering, low testosterone  levels, and heartburn. You are currently tapering your methadone  dosage and experiencing challenges with withdrawal symptoms. We also reviewed your testosterone  replacement therapy options and addressed your heartburn management.  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 You are currently tapering your dose by 5 mg every two weeks and are now at 90 mg. We discussed the challenges you are facing and the possibility of slowing the taper to 5 mg per month once you reach 50 mg. Clonidine may help manage some withdrawal symptoms, and we will consider prescribing it if needed.  -LOW TESTOSTERONE : Low testosterone  can lead to symptoms like reduced muscle mass and energy levels. This condition may be worsened by methadone  use. We discussed switching from testosterone  gel to testosterone  pellets, which are more effective. For now, continue using the testosterone  gel with a 75 gram pump, 2 pumps daily, with 5 refills. We will refer you to a urologist for pellet therapy and provided you with an informational handout on your options.  -HEARTBURN: Heartburn is a burning sensation in your chest caused by stomach acid. You have been using Prilosec, but we will switch to Protonix  as a more affordable alternative. This should help manage your heartburn symptoms effectively.  INSTRUCTIONS:  Continue tapering your methadone  dosage as discussed, and consider slowing the taper once you reach 50 mg. Use the testosterone  gel as prescribed and follow up with a urologist for pellet therapy. Start taking Protonix  for heartburn management. If you experience any issues or have questions, please contact our office.    Testosterone  Replacement Options for Men on Methadone : Understanding Pellets vs. Gel Why Testosterone   Levels Drop on Methadone  Long-term methadone  treatment can reduce the body's natural production of testosterone . Low testosterone  may cause: Low energy Low sex drive Erectile dysfunction Depression or mood changes Loss of muscle or bone mass Treatment Options There are several ways to replace testosterone . The most common include: 1. Topical Gels (e.g., AndroGel , Testim ) How it works: Applied daily to the skin (shoulders, upper arms, or abdomen). Pros: Easy to use at home; avoids needles or procedures. Cons: Needs daily use; can transfer to others through skin contact; levels can fluctuate day-to-day. 2. Testosterone  Pellets (e.g., Testopel ) How it works: Small pellets are inserted under the skin (usually in the hip area) during a quick office procedure. They slowly release testosterone  over 3-6 months. Pros: Long-acting; steady testosterone  levels; no daily medication; no risk of transferring to others. Cons: Requires a minor procedure every few months; mild soreness or bruising at the site; risk of pellet extrusion or infection (rare). How Do Pellets Compare to Gels in Men on Methadone ? Pellets may offer more consistent testosterone  levels than gels, which is especially helpful for men whose testosterone  is suppressed by methadone . Some studies and clinical experience suggest pellets can be more effective for symptom relief in men on opioids. Men who have trouble remembering daily medication or who didn't feel better on gels often do well on pellets. What to Expect with Pellet Therapy Procedure takes about 10-15 minutes in the clinic. You may need pellets inserted every 3-6 months. It can take a few weeks to notice improvement in symptoms. Blood levels will be monitored to make sure testosterone  is in a healthy range. Is It Right for You? Testosterone  pellets can be a good choice if: You had little improvement  on gels. You prefer not to apply something daily. You want stable, long-term  testosterone  levels. Next Steps If you're interested in testosterone  pellets: Talk to your provider about your testosterone  levels, symptoms, and treatment history. Insurance coverage and cost can vary -- we can help check your benefits. A brief office procedure will be scheduled if you decide to move forward.  Would you like this as a Government social research officer PDF or formatted for a clinic handout with your logo?

## 2023-11-28 NOTE — Progress Notes (Signed)
 ==============================  [facility]: [phone]   -- Medical Office Visit --  Patient: Reginald Clarke      Age: 33 y.o.       Sex:  male  Date:   11/28/2023 Today's Healthcare Provider: Anthon Kins, MD  ==============================   Chief Complaint: Medical Management of Chronic Issues (Patient presents today for a follow-up appointment.)  History of Present Illness 33 year old male who presents for management of testosterone  replacement therapy and methadone  tapering.  He is currently tapering off methadone , reducing by 5 mg every two weeks, and is now at 90 mg, having previously been on 110 mg for about a year. The highest dose he has been on was 130 mg. He is considering slowing the taper to 5 mg per month once he reaches 50 mg. He describes significant challenges with tapering, referring to it as 'hell', and is interested in strategies to manage withdrawal symptoms.  He is experiencing low testosterone  levels, which he attributes to methadone  use. He has previously tried testosterone  gel but found it ineffective and is interested in testosterone  pellets as a more effective alternative. He is currently using a testosterone  gel with a 75 gram pump, which should last a month with five refills available, providing a six-month supply.  He experiences heartburn and has been taking Prilosec. He is open to trying Protonix  as a more affordable alternative, although he notes that it is not necessarily more effective than Prilosec.  Socially, he has been actively engaging in physical exercise, going to the gym five days a week, running a mile daily, and has lost 35 pounds. He is focused on building muscle and has been monitoring his macros and taking creatine, but feels his muscle growth is not as expected due to low testosterone  levels.   Background Reviewed: Problem List: has MRSA bacteremia; Chronic pain syndrome/methadone  dependency; GERD  (gastroesophageal reflux disease); History of smoking; H/O tricuspid valve replacement; Substance use disorder; Septic pulmonary embolism ([name]); Anxiety disorder; Normocytic anemia; Prolonged QT interval; History of bacterial endocarditis; Retained tooth root; Chronic apical periodontitis; Edentulous; Pleural effusion; Erectile dysfunction; High risk medication use; Low testosterone ; and Nutritional deficiency on their problem list. Medications:  has a current medication list which includes the following prescription(s): ascorbic acid , b complex vitamins, escitalopram , folic acid , gabapentin , pantoprazole , tadalafil , testosterone , testosterone , and [DISCONTINUED] methadone  hcl.  Allergies:  has no known allergies.  Past Medical History:  has a past medical history of Anxiety, Endocarditis, Hepatitis, Hepatitis C, chronic ([name]) (11/24/2021), Opiate dependence ([name]), Pneumonia of both lower lobes due to methicillin resistant Staphylococcus aureus (MRSA) ([name]), and Smoking (12/25/2021). Past Surgical History:   has a past surgical history that includes tubes in ears; Wrist fracture surgery; TEE without cardioversion (N/A, 11/23/2021); Application of angiovac (N/A, 11/28/2021); TEE without cardioversion (N/A, 11/28/2021); TEE without cardioversion (N/A, 03/07/2022); Multiple extractions with alveoloplasty (N/A, 04/18/2022); Tricuspid valve replacement (N/A, 04/23/2022); and TEE without cardioversion (N/A, 04/23/2022). Social History:   reports that he quit smoking about 23 months ago. His smoking use included cigarettes. He started smoking about 20 years ago. He has a 19 pack-year smoking history. He has never used smokeless tobacco. He reports current drug use. Drugs: Methamphetamines, Marijuana, and Heroin. He reports that he does not drink alcohol. Family History:  family history is not on file. Depression Screen and Health Maintenance:    07/05/2022   10:22 AM 06/07/2022   10:39 AM  PHQ 2/9 Scores  PHQ - 2 Score  0 0    Medication Reconciliation: Current Outpatient Medications on File Prior to Visit  Medication Sig   ascorbic acid  (VITAMIN C ) 500 MG tablet Take 1 tablet (500 mg total) by mouth daily.   b complex vitamins capsule Take 1 capsule by mouth daily.   escitalopram  (LEXAPRO ) 20 MG tablet Take 1 tablet (20 mg total) by mouth daily.   folic acid  (FOLVITE ) 1 MG tablet Take 1 tablet (1 mg total) by mouth daily.   gabapentin  (NEURONTIN ) 300 MG capsule Take 1 capsule (300 mg total) by mouth 3 (three) times daily.   tadalafil  (CIALIS ) 20 MG tablet Take 0.5-1 tablets (10-20 mg total) by mouth every other day as needed for erectile dysfunction.   Testosterone  1.62 % GEL SMARTSIG:2 Pump Topical Every Morning   [DISCONTINUED] METHADONE  HCL PO Take 100 mg by mouth daily.   No current facility-administered medications on file prior to visit.  There are no discontinued medications.   Aaron Aaslowtesto   Physical Exam:    11/28/2023    2:28 PM 09/26/2022    9:34 AM 08/21/2022   10:35 AM  Vitals with BMI  Height 5\' 10"  5\' 10"  5\' 10"   Weight 190 lbs 6 oz 202 lbs 13 oz 200 lbs  BMI 27.32 29.1 28.7  Systolic 110 110 540  Diastolic 66 78 85  Pulse 67 70 71   Wt Readings from Last 10 Encounters:  11/28/23 190 lb 6.4 oz (86.4 kg)  09/26/22 202 lb 12.8 oz (92 kg)  08/21/22 200 lb (90.7 kg)  07/26/22 207 lb (93.9 kg)  07/22/22 203 lb 3.2 oz (92.2 kg)  07/18/22 200 lb (90.7 kg)  07/05/22 206 lb (93.4 kg)  07/03/22 206 lb (93.4 kg)  06/20/22 198 lb 12.8 oz (90.2 kg)  06/07/22 184 lb 12.8 oz (83.8 kg)  Vital signs reviewed.  Nursing notes reviewed. Weight trend reviewed. Physical Exam  Physical Exam General Appearance:  No acute distress appreciable.   Well-groomed, healthy-appearing male.  Well proportioned with no abnormal fat distribution.  Good muscle tone. Pulmonary:  Normal work of breathing at rest, no respiratory distress apparent. SpO2: 97 %  Musculoskeletal: All extremities are intact.   Neurological:  Awake, alert, oriented, and engaged.  No obvious focal neurological deficits or cognitive impairments.  Sensorium seems unclouded.   Speech is clear and coherent with logical content. Psychiatric:  Appropriate mood, pleasant and cooperative demeanor, thoughtful and engaged during the exam      No results found for any visits on 11/28/23. No visits with results within 1 Year(s) from this visit.  Latest known visit with results is:  Office Visit on 09/26/2022  Component Date Value   WBC 09/26/2022 6.0    RBC 09/26/2022 4.95    Hemoglobin 09/26/2022 14.5    HCT 09/26/2022 42.6    MCV 09/26/2022 86.1    MCHC 09/26/2022 34.0    RDW 09/26/2022 16.2 (H)    Platelets 09/26/2022 203.0    Neutrophils Relative % 09/26/2022 48.8    Lymphocytes Relative 09/26/2022 41.0    Monocytes Relative 09/26/2022 7.3    Eosinophils Relative 09/26/2022 2.3    Basophils Relative 09/26/2022 0.6    Neutro Abs 09/26/2022 2.9    Lymphs Abs 09/26/2022 2.5    Monocytes Absolute 09/26/2022 0.4    Eosinophils Absolute 09/26/2022 0.1    Basophils Absolute 09/26/2022 0.0    Sodium 09/26/2022 138    Potassium 09/26/2022 4.7    Chloride 09/26/2022 101    CO2 09/26/2022  27    Glucose, Bld 09/26/2022 80    BUN 09/26/2022 12    Creatinine, Ser 09/26/2022 0.76    Total Bilirubin 09/26/2022 0.5    Alkaline Phosphatase 09/26/2022 110    AST 09/26/2022 21    ALT 09/26/2022 16    Total Protein 09/26/2022 7.7    Albumin  09/26/2022 4.9    GFR 09/26/2022 119.62    Calcium  09/26/2022 10.2    Magnesium  09/26/2022 1.9    Vitamin B-12 09/26/2022 1,116 (H)    Folate 09/26/2022 >23.9    Ferritin 09/26/2022 83.5    Testosterone  09/26/2022 142.31 (L)    TSH 09/26/2022 2.51   No image results found. No results found.    Results LABS Testosterone : low     Assessment & Plan Hypogonadism, male Low testosterone  likely exacerbated by methadone  use. Discussed testosterone  replacement options, with  pellets being more effective due to better absorption and less fluctuation. Gel is a temporary option until pellet therapy is arranged. Benefits include improved muscle growth and well-being, especially with methadone  use. Provided information on gel transfer risks and DEA permission for pellet storage. Prescribe testosterone  gel with a 75 gram pump, 2 pumps daily, with 5 refills. Refer to a urologist for pellet therapy and provide an informational handout on replacement options. Low testosterone  Low testosterone  likely exacerbated by methadone  use. Discussed testosterone  replacement options, with pellets being more effective due to better absorption and less fluctuation. Gel is a temporary option until pellet therapy is arranged. Benefits include improved muscle growth and well-being, especially with methadone  use. Provided information on gel transfer risks and DEA permission for pellet storage. Prescribe testosterone  gel with a 75 gram pump, 2 pumps daily, with 5 refills. Refer to a urologist for pellet therapy and provide an informational handout on replacement options. Gastroesophageal reflux disease with esophagitis without hemorrhage Experiences heartburn and has been using Prilosec. Protonix  was chosen as a more affordable alternative, considering insurance factors. Prescribe Protonix  for heartburn management. Chronic pain syndrome/methadone  dependency Currently tapering methadone  by 5 mg every two weeks, now at 90 mg. Discussed challenges and potential withdrawal symptoms. Advised that tapering may need to slow as dosage decreases, particularly below 50 mg. Clonidine may help manage adrenaline spikes during withdrawal but will not eliminate symptoms. Consider prescribing clonidine and advise slowing tapering as dosage decreases.  PDMP reviewed during this encounter.      Orders Placed During this Encounter:   Orders Placed This Encounter  Procedures   Ambulatory referral to Urology    Referral  Priority:   Routine    Referral Type:   Consultation    Referral Reason:   Specialty Services Required    Requested Specialty:   Urology    Number of Visits Requested:   1   Meds ordered this encounter  Medications   Testosterone  20.25 MG/ACT (1.62%) GEL    Sig: Place 2 Pump onto the skin daily at 6 (six) AM.    Dispense:  75 g    Refill:  5   pantoprazole  (PROTONIX ) 40 MG tablet    Sig: Take 1 tablet (40 mg total) by mouth daily.    Dispense:  30 tablet    Refill:  3         Additional Info: This encounter employed state-of-the-art, real-time, collaborative documentation. The patient actively reviewed and assisted in updating their electronic medical record on a shared screen, ensuring transparency and facilitating joint problem-solving for the problem list, overview, and plan. This approach promotes accurate,  informed care. The treatment plan was discussed and reviewed in detail, including medication safety, potential side effects, and all patient questions. We confirmed understanding and comfort with the plan. Follow-up instructions were established, including contacting the office for any concerns, returning if symptoms worsen, persist, or new symptoms develop, and precautions for potential emergency department visits.   This document was synthesized by artificial intelligence (Abridge) using HIPAA-compliant recording of the clinical interaction;   We discussed the use of AI scribe software for clinical note transcription with the patient, who gave verbal consent to proceed.

## 2023-11-29 NOTE — Assessment & Plan Note (Signed)
 Low testosterone  likely exacerbated by methadone  use. Discussed testosterone  replacement options, with pellets being more effective due to better absorption and less fluctuation. Gel is a temporary option until pellet therapy is arranged. Benefits include improved muscle growth and well-being, especially with methadone  use. Provided information on gel transfer risks and DEA permission for pellet storage. Prescribe testosterone  gel with a 75 gram pump, 2 pumps daily, with 5 refills. Refer to a urologist for pellet therapy and provide an informational handout on replacement options.

## 2023-11-29 NOTE — Assessment & Plan Note (Signed)
 Experiences heartburn and has been using Prilosec. Protonix  was chosen as a more affordable alternative, considering insurance factors. Prescribe Protonix  for heartburn management.

## 2023-11-29 NOTE — Assessment & Plan Note (Signed)
 Currently tapering methadone  by 5 mg every two weeks, now at 90 mg. Discussed challenges and potential withdrawal symptoms. Advised that tapering may need to slow as dosage decreases, particularly below 50 mg. Clonidine may help manage adrenaline spikes during withdrawal but will not eliminate symptoms. Consider prescribing clonidine and advise slowing tapering as dosage decreases.

## 2023-12-02 ENCOUNTER — Other Ambulatory Visit (HOSPITAL_COMMUNITY): Payer: Self-pay

## 2023-12-02 ENCOUNTER — Telehealth: Payer: Self-pay

## 2023-12-02 NOTE — Telephone Encounter (Signed)
*  Primary  Pharmacy Patient Advocate Encounter   Received notification from CoverMyMeds that prior authorization for Testosterone  1.62% gel  is required/requested.   Insurance verification completed.   The patient is insured through [facility] .   Per test claim: PA required; PA submitted to above mentioned insurance via CoverMyMeds Key/confirmation #/EOC [id number] Status is pending

## 2023-12-03 ENCOUNTER — Other Ambulatory Visit (HOSPITAL_COMMUNITY): Payer: Self-pay

## 2023-12-03 NOTE — Telephone Encounter (Signed)
 Pharmacy Patient Advocate Encounter  Received notification from [facility] that Prior Authorization for Testosterone  1.62% gel has been APPROVED from 12/02/23 to 12/01/24. Ran test claim, Copay is $63.31. This test claim was processed through [facility]- copay amounts may vary at other pharmacies due to pharmacy/plan contracts, or as the patient moves through the different stages of their insurance plan.   PA #/Case ID/Reference #:  [id number]

## 2024-03-15 ENCOUNTER — Other Ambulatory Visit: Payer: Self-pay | Admitting: Internal Medicine

## 2024-03-15 DIAGNOSIS — K2100 Gastro-esophageal reflux disease with esophagitis, without bleeding: Secondary | ICD-10-CM

## 2024-04-12 ENCOUNTER — Other Ambulatory Visit: Payer: Self-pay | Admitting: Internal Medicine

## 2024-04-12 DIAGNOSIS — K2100 Gastro-esophageal reflux disease with esophagitis, without bleeding: Secondary | ICD-10-CM

## 2024-05-10 ENCOUNTER — Other Ambulatory Visit: Payer: Self-pay | Admitting: Internal Medicine

## 2024-05-10 DIAGNOSIS — K2100 Gastro-esophageal reflux disease with esophagitis, without bleeding: Secondary | ICD-10-CM

## 2024-06-06 ENCOUNTER — Other Ambulatory Visit: Payer: Self-pay | Admitting: Internal Medicine

## 2024-06-06 DIAGNOSIS — K2100 Gastro-esophageal reflux disease with esophagitis, without bleeding: Secondary | ICD-10-CM

## 2024-07-09 ENCOUNTER — Other Ambulatory Visit: Payer: Self-pay | Admitting: Internal Medicine

## 2024-07-09 DIAGNOSIS — K2100 Gastro-esophageal reflux disease with esophagitis, without bleeding: Secondary | ICD-10-CM

## 2024-08-26 ENCOUNTER — Other Ambulatory Visit: Payer: Self-pay | Admitting: Internal Medicine

## 2024-08-26 DIAGNOSIS — K2100 Gastro-esophageal reflux disease with esophagitis, without bleeding: Secondary | ICD-10-CM
